# Patient Record
Sex: Male | Born: 1945
Health system: Southern US, Community
[De-identification: ages and names within clinical notes are randomized; demographics above are authoritative.]

## PROBLEM LIST (undated history)

## (undated) DIAGNOSIS — E119 Type 2 diabetes mellitus without complications: Secondary | ICD-10-CM

## (undated) DIAGNOSIS — F32A Depression, unspecified: Secondary | ICD-10-CM

## (undated) DIAGNOSIS — Z8673 Personal history of transient ischemic attack (TIA), and cerebral infarction without residual deficits: Secondary | ICD-10-CM

## (undated) DIAGNOSIS — K922 Gastrointestinal hemorrhage, unspecified: Secondary | ICD-10-CM

## (undated) DIAGNOSIS — E785 Hyperlipidemia, unspecified: Secondary | ICD-10-CM

## (undated) DIAGNOSIS — I4891 Unspecified atrial fibrillation: Secondary | ICD-10-CM

## (undated) DIAGNOSIS — N182 Chronic kidney disease, stage 2 (mild): Secondary | ICD-10-CM

## (undated) DIAGNOSIS — G4730 Sleep apnea, unspecified: Secondary | ICD-10-CM

## (undated) DIAGNOSIS — I639 Cerebral infarction, unspecified: Secondary | ICD-10-CM

## (undated) DIAGNOSIS — K259 Gastric ulcer, unspecified as acute or chronic, without hemorrhage or perforation: Secondary | ICD-10-CM

## (undated) DIAGNOSIS — Z7901 Long term (current) use of anticoagulants: Secondary | ICD-10-CM

## (undated) DIAGNOSIS — F329 Major depressive disorder, single episode, unspecified: Secondary | ICD-10-CM

## (undated) DIAGNOSIS — K219 Gastro-esophageal reflux disease without esophagitis: Secondary | ICD-10-CM

## (undated) DIAGNOSIS — I480 Paroxysmal atrial fibrillation: Secondary | ICD-10-CM

## (undated) DIAGNOSIS — R0602 Shortness of breath: Secondary | ICD-10-CM

## (undated) DIAGNOSIS — I4821 Permanent atrial fibrillation: Secondary | ICD-10-CM

## (undated) DIAGNOSIS — I495 Sick sinus syndrome: Secondary | ICD-10-CM

## (undated) DIAGNOSIS — I2510 Atherosclerotic heart disease of native coronary artery without angina pectoris: Secondary | ICD-10-CM

## (undated) DIAGNOSIS — I219 Acute myocardial infarction, unspecified: Secondary | ICD-10-CM

## (undated) DIAGNOSIS — N185 Chronic kidney disease, stage 5: Secondary | ICD-10-CM

## (undated) DIAGNOSIS — I10 Essential (primary) hypertension: Secondary | ICD-10-CM

## (undated) HISTORY — DX: Essential (primary) hypertension: I10

## (undated) HISTORY — DX: Hyperlipidemia, unspecified: E78.5

## (undated) HISTORY — DX: Paroxysmal atrial fibrillation: I48.0

## (undated) HISTORY — DX: Atherosclerotic heart disease of native coronary artery without angina pectoris: I25.10

## (undated) HISTORY — DX: Personal history of transient ischemic attack (TIA), and cerebral infarction without residual deficits: Z86.73

## (undated) HISTORY — DX: Chronic kidney disease, stage 5: N18.5

## (undated) HISTORY — DX: Sleep apnea, unspecified: G47.30

## (undated) HISTORY — DX: Cerebral infarction, unspecified: I63.9

## (undated) HISTORY — DX: Type 2 diabetes mellitus without complications: E11.9

## (undated) HISTORY — DX: Chronic kidney disease, stage 2 (mild): N18.2

## (undated) HISTORY — DX: Depression, unspecified: F32.A

## (undated) HISTORY — DX: Major depressive disorder, single episode, unspecified: F32.9

---
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
## 1999-03-31 DIAGNOSIS — I219 Acute myocardial infarction, unspecified: Secondary | ICD-10-CM

## 1999-03-31 HISTORY — DX: Acute myocardial infarction, unspecified: I21.9

## 2002-05-24 ENCOUNTER — Emergency Department (HOSPITAL_COMMUNITY): Admission: EM | Admit: 2002-05-24 | Discharge: 2002-05-24 | Payer: Self-pay | Admitting: Emergency Medicine

## 2003-01-26 ENCOUNTER — Encounter: Payer: Self-pay | Admitting: Emergency Medicine

## 2003-01-26 ENCOUNTER — Emergency Department (HOSPITAL_COMMUNITY): Admission: EM | Admit: 2003-01-26 | Discharge: 2003-01-27 | Payer: Self-pay | Admitting: Emergency Medicine

## 2003-02-03 ENCOUNTER — Encounter: Admission: RE | Admit: 2003-02-03 | Discharge: 2003-02-03 | Payer: Self-pay | Admitting: Internal Medicine

## 2003-02-17 ENCOUNTER — Ambulatory Visit (HOSPITAL_COMMUNITY): Admission: RE | Admit: 2003-02-17 | Discharge: 2003-02-17 | Payer: Self-pay | Admitting: Internal Medicine

## 2003-02-17 ENCOUNTER — Encounter: Admission: RE | Admit: 2003-02-17 | Discharge: 2003-02-17 | Payer: Self-pay | Admitting: Internal Medicine

## 2003-02-19 ENCOUNTER — Encounter: Admission: RE | Admit: 2003-02-19 | Discharge: 2003-02-19 | Payer: Self-pay | Admitting: Internal Medicine

## 2003-02-22 ENCOUNTER — Encounter: Admission: RE | Admit: 2003-02-22 | Discharge: 2003-02-22 | Payer: Self-pay | Admitting: Internal Medicine

## 2003-02-26 ENCOUNTER — Encounter: Admission: RE | Admit: 2003-02-26 | Discharge: 2003-02-26 | Payer: Self-pay | Admitting: Internal Medicine

## 2003-03-05 ENCOUNTER — Encounter: Admission: RE | Admit: 2003-03-05 | Discharge: 2003-03-05 | Payer: Self-pay | Admitting: Internal Medicine

## 2003-03-17 ENCOUNTER — Encounter: Admission: RE | Admit: 2003-03-17 | Discharge: 2003-03-17 | Payer: Self-pay | Admitting: Internal Medicine

## 2003-03-30 ENCOUNTER — Encounter: Payer: Self-pay | Admitting: Internal Medicine

## 2003-03-30 ENCOUNTER — Encounter: Admission: RE | Admit: 2003-03-30 | Discharge: 2003-03-30 | Payer: Self-pay | Admitting: Internal Medicine

## 2003-03-30 ENCOUNTER — Inpatient Hospital Stay (HOSPITAL_COMMUNITY): Admission: RE | Admit: 2003-03-30 | Discharge: 2003-04-08 | Payer: Self-pay | Admitting: Internal Medicine

## 2003-04-01 ENCOUNTER — Encounter: Payer: Self-pay | Admitting: Internal Medicine

## 2003-04-07 ENCOUNTER — Encounter: Payer: Self-pay | Admitting: Internal Medicine

## 2003-04-12 ENCOUNTER — Encounter: Admission: RE | Admit: 2003-04-12 | Discharge: 2003-04-12 | Payer: Self-pay | Admitting: Internal Medicine

## 2003-04-16 ENCOUNTER — Encounter: Admission: RE | Admit: 2003-04-16 | Discharge: 2003-04-16 | Payer: Self-pay | Admitting: Internal Medicine

## 2003-04-22 ENCOUNTER — Encounter: Admission: RE | Admit: 2003-04-22 | Discharge: 2003-04-22 | Payer: Self-pay | Admitting: Internal Medicine

## 2003-04-26 ENCOUNTER — Encounter: Admission: RE | Admit: 2003-04-26 | Discharge: 2003-04-26 | Payer: Self-pay | Admitting: Internal Medicine

## 2003-04-29 ENCOUNTER — Encounter: Admission: RE | Admit: 2003-04-29 | Discharge: 2003-04-29 | Payer: Self-pay | Admitting: Internal Medicine

## 2003-05-04 ENCOUNTER — Encounter: Admission: RE | Admit: 2003-05-04 | Discharge: 2003-05-04 | Payer: Self-pay | Admitting: Internal Medicine

## 2003-05-11 ENCOUNTER — Encounter: Admission: RE | Admit: 2003-05-11 | Discharge: 2003-05-11 | Payer: Self-pay | Admitting: Internal Medicine

## 2003-05-25 ENCOUNTER — Encounter: Admission: RE | Admit: 2003-05-25 | Discharge: 2003-05-25 | Payer: Self-pay | Admitting: Internal Medicine

## 2003-06-08 ENCOUNTER — Encounter: Admission: RE | Admit: 2003-06-08 | Discharge: 2003-06-08 | Payer: Self-pay | Admitting: Internal Medicine

## 2003-07-31 HISTORY — PX: CARDIAC CATHETERIZATION: SHX172

## 2003-09-23 ENCOUNTER — Encounter: Admission: RE | Admit: 2003-09-23 | Discharge: 2003-09-23 | Payer: Self-pay | Admitting: Internal Medicine

## 2003-09-30 ENCOUNTER — Encounter: Admission: RE | Admit: 2003-09-30 | Discharge: 2003-09-30 | Payer: Self-pay | Admitting: Internal Medicine

## 2004-03-06 ENCOUNTER — Encounter: Admission: RE | Admit: 2004-03-06 | Discharge: 2004-03-06 | Payer: Self-pay | Admitting: Internal Medicine

## 2004-03-13 ENCOUNTER — Encounter: Admission: RE | Admit: 2004-03-13 | Discharge: 2004-03-13 | Payer: Self-pay | Admitting: Internal Medicine

## 2004-04-24 ENCOUNTER — Ambulatory Visit: Payer: Self-pay | Admitting: Internal Medicine

## 2004-04-24 ENCOUNTER — Inpatient Hospital Stay (HOSPITAL_COMMUNITY): Admission: RE | Admit: 2004-04-24 | Discharge: 2004-04-27 | Payer: Self-pay | Admitting: Internal Medicine

## 2004-04-25 ENCOUNTER — Encounter: Payer: Self-pay | Admitting: Cardiology

## 2004-05-03 ENCOUNTER — Ambulatory Visit: Payer: Self-pay | Admitting: Internal Medicine

## 2004-05-23 ENCOUNTER — Ambulatory Visit: Payer: Self-pay | Admitting: Internal Medicine

## 2004-06-15 ENCOUNTER — Ambulatory Visit: Payer: Self-pay | Admitting: Internal Medicine

## 2004-06-15 ENCOUNTER — Ambulatory Visit (HOSPITAL_COMMUNITY): Admission: RE | Admit: 2004-06-15 | Discharge: 2004-06-15 | Payer: Self-pay | Admitting: Internal Medicine

## 2004-07-04 ENCOUNTER — Ambulatory Visit: Payer: Self-pay | Admitting: Internal Medicine

## 2004-07-19 ENCOUNTER — Ambulatory Visit: Payer: Self-pay | Admitting: Internal Medicine

## 2004-08-28 ENCOUNTER — Ambulatory Visit: Payer: Self-pay | Admitting: Internal Medicine

## 2004-09-04 ENCOUNTER — Ambulatory Visit: Payer: Self-pay | Admitting: Internal Medicine

## 2004-10-12 ENCOUNTER — Ambulatory Visit: Payer: Self-pay | Admitting: Internal Medicine

## 2004-10-27 ENCOUNTER — Ambulatory Visit: Payer: Self-pay | Admitting: Internal Medicine

## 2005-08-09 ENCOUNTER — Ambulatory Visit: Payer: Self-pay | Admitting: Physical Medicine & Rehabilitation

## 2005-08-09 ENCOUNTER — Inpatient Hospital Stay (HOSPITAL_COMMUNITY): Admission: EM | Admit: 2005-08-09 | Discharge: 2005-08-24 | Payer: Self-pay | Admitting: Emergency Medicine

## 2005-08-09 ENCOUNTER — Ambulatory Visit: Payer: Self-pay | Admitting: Internal Medicine

## 2005-08-11 ENCOUNTER — Encounter (INDEPENDENT_AMBULATORY_CARE_PROVIDER_SITE_OTHER): Payer: Self-pay | Admitting: Specialist

## 2005-08-27 ENCOUNTER — Ambulatory Visit: Payer: Self-pay | Admitting: Internal Medicine

## 2005-09-11 ENCOUNTER — Ambulatory Visit: Payer: Self-pay | Admitting: Internal Medicine

## 2005-09-14 ENCOUNTER — Ambulatory Visit: Payer: Self-pay | Admitting: Internal Medicine

## 2005-09-19 ENCOUNTER — Ambulatory Visit: Payer: Self-pay | Admitting: Internal Medicine

## 2005-10-09 ENCOUNTER — Encounter: Admission: RE | Admit: 2005-10-09 | Discharge: 2005-10-09 | Payer: Self-pay | Admitting: Neurosurgery

## 2005-11-22 ENCOUNTER — Ambulatory Visit: Payer: Self-pay | Admitting: Internal Medicine

## 2005-12-11 ENCOUNTER — Ambulatory Visit (HOSPITAL_COMMUNITY): Admission: RE | Admit: 2005-12-11 | Discharge: 2005-12-11 | Payer: Self-pay | Admitting: Internal Medicine

## 2005-12-11 ENCOUNTER — Encounter (INDEPENDENT_AMBULATORY_CARE_PROVIDER_SITE_OTHER): Payer: Self-pay | Admitting: Cardiology

## 2006-01-08 ENCOUNTER — Ambulatory Visit (HOSPITAL_COMMUNITY): Admission: RE | Admit: 2006-01-08 | Discharge: 2006-01-08 | Payer: Self-pay | Admitting: Internal Medicine

## 2006-01-08 ENCOUNTER — Ambulatory Visit: Payer: Self-pay | Admitting: Internal Medicine

## 2006-04-24 ENCOUNTER — Ambulatory Visit: Payer: Self-pay | Admitting: Hospitalist

## 2006-04-25 ENCOUNTER — Ambulatory Visit: Payer: Self-pay | Admitting: Internal Medicine

## 2006-05-22 ENCOUNTER — Ambulatory Visit: Payer: Self-pay | Admitting: Internal Medicine

## 2006-05-29 ENCOUNTER — Encounter (INDEPENDENT_AMBULATORY_CARE_PROVIDER_SITE_OTHER): Payer: Self-pay | Admitting: Pulmonary Disease

## 2006-05-29 ENCOUNTER — Ambulatory Visit: Payer: Self-pay | Admitting: Internal Medicine

## 2006-05-29 LAB — CONVERTED CEMR LAB
BUN: 18 mg/dL (ref 6–23)
CO2: 30 meq/L (ref 19–32)
Creatinine, Ser: 1.65 mg/dL — ABNORMAL HIGH (ref 0.40–1.50)

## 2006-06-05 ENCOUNTER — Inpatient Hospital Stay (HOSPITAL_COMMUNITY): Admission: AD | Admit: 2006-06-05 | Discharge: 2006-06-06 | Payer: Self-pay | Admitting: Infectious Diseases

## 2006-06-05 ENCOUNTER — Ambulatory Visit: Payer: Self-pay | Admitting: Infectious Diseases

## 2006-06-05 ENCOUNTER — Ambulatory Visit: Payer: Self-pay | Admitting: Hospitalist

## 2006-06-05 DIAGNOSIS — M109 Gout, unspecified: Secondary | ICD-10-CM | POA: Insufficient documentation

## 2006-06-05 DIAGNOSIS — E785 Hyperlipidemia, unspecified: Secondary | ICD-10-CM | POA: Insufficient documentation

## 2006-06-05 DIAGNOSIS — I10 Essential (primary) hypertension: Secondary | ICD-10-CM | POA: Insufficient documentation

## 2006-06-05 DIAGNOSIS — E119 Type 2 diabetes mellitus without complications: Secondary | ICD-10-CM | POA: Insufficient documentation

## 2006-06-05 DIAGNOSIS — G4730 Sleep apnea, unspecified: Secondary | ICD-10-CM | POA: Insufficient documentation

## 2006-06-05 DIAGNOSIS — F329 Major depressive disorder, single episode, unspecified: Secondary | ICD-10-CM | POA: Insufficient documentation

## 2006-06-05 DIAGNOSIS — E782 Mixed hyperlipidemia: Secondary | ICD-10-CM | POA: Insufficient documentation

## 2006-07-19 DIAGNOSIS — I2510 Atherosclerotic heart disease of native coronary artery without angina pectoris: Secondary | ICD-10-CM | POA: Insufficient documentation

## 2006-08-19 ENCOUNTER — Ambulatory Visit: Payer: Self-pay | Admitting: Internal Medicine

## 2006-08-19 ENCOUNTER — Ambulatory Visit (HOSPITAL_COMMUNITY): Admission: RE | Admit: 2006-08-19 | Discharge: 2006-08-19 | Payer: Self-pay | Admitting: Internal Medicine

## 2006-08-19 ENCOUNTER — Encounter (INDEPENDENT_AMBULATORY_CARE_PROVIDER_SITE_OTHER): Payer: Self-pay | Admitting: Internal Medicine

## 2006-08-20 LAB — CONVERTED CEMR LAB
ALT: 22 units/L (ref 0–53)
AST: 20 units/L (ref 0–37)
Albumin: 4.3 g/dL (ref 3.5–5.2)
Alkaline Phosphatase: 88 units/L (ref 39–117)
BUN: 27 mg/dL — ABNORMAL HIGH (ref 6–23)
Basophils Absolute: 0 10*3/uL (ref 0.0–0.1)
Basophils Relative: 1 % (ref 0–1)
CO2: 28 meq/L (ref 19–32)
Calcium: 9.7 mg/dL (ref 8.4–10.5)
Chloride: 105 meq/L (ref 96–112)
Creatinine, Ser: 1.73 mg/dL — ABNORMAL HIGH (ref 0.40–1.50)
Eosinophils Relative: 10 % — ABNORMAL HIGH (ref 0–5)
Glucose, Bld: 83 mg/dL (ref 70–99)
HCT: 44.9 % (ref 39.0–52.0)
Hemoglobin: 14.5 g/dL (ref 13.0–17.0)
Lymphocytes Relative: 34 % (ref 12–46)
Lymphs Abs: 2.1 10*3/uL (ref 0.7–3.3)
MCHC: 32.3 g/dL (ref 30.0–36.0)
MCV: 82.1 fL (ref 78.0–100.0)
Monocytes Absolute: 0.7 10*3/uL (ref 0.2–0.7)
Monocytes Relative: 11 % (ref 3–11)
Neutro Abs: 2.8 10*3/uL (ref 1.7–7.7)
Neutrophils Relative %: 45 % (ref 43–77)
Platelets: 229 10*3/uL (ref 150–400)
Potassium: 4 meq/L (ref 3.5–5.3)
RBC: 5.47 M/uL (ref 4.22–5.81)
RDW: 13.3 % (ref 11.5–14.0)
Sodium: 143 meq/L (ref 135–145)
Total Bilirubin: 0.5 mg/dL (ref 0.3–1.2)
Total Protein: 7.1 g/dL (ref 6.0–8.3)
WBC: 6.3 10*3/uL (ref 4.0–10.5)

## 2006-09-19 ENCOUNTER — Ambulatory Visit: Payer: Self-pay | Admitting: Hospitalist

## 2006-09-19 ENCOUNTER — Encounter (INDEPENDENT_AMBULATORY_CARE_PROVIDER_SITE_OTHER): Payer: Self-pay | Admitting: *Deleted

## 2006-10-09 ENCOUNTER — Ambulatory Visit: Payer: Self-pay | Admitting: *Deleted

## 2006-10-09 ENCOUNTER — Encounter (INDEPENDENT_AMBULATORY_CARE_PROVIDER_SITE_OTHER): Payer: Self-pay | Admitting: *Deleted

## 2006-10-09 ENCOUNTER — Ambulatory Visit (HOSPITAL_COMMUNITY): Admission: RE | Admit: 2006-10-09 | Discharge: 2006-10-09 | Payer: Self-pay | Admitting: Internal Medicine

## 2006-10-09 LAB — CONVERTED CEMR LAB
Blood Glucose, Fingerstick: 166
INR: 0.9

## 2006-10-14 ENCOUNTER — Ambulatory Visit: Payer: Self-pay | Admitting: *Deleted

## 2006-10-14 ENCOUNTER — Encounter (INDEPENDENT_AMBULATORY_CARE_PROVIDER_SITE_OTHER): Payer: Self-pay | Admitting: Pharmacist

## 2006-10-14 LAB — CONVERTED CEMR LAB: INR: 1.3

## 2006-10-23 ENCOUNTER — Encounter (INDEPENDENT_AMBULATORY_CARE_PROVIDER_SITE_OTHER): Payer: Self-pay | Admitting: Internal Medicine

## 2006-10-23 ENCOUNTER — Ambulatory Visit: Payer: Self-pay | Admitting: Internal Medicine

## 2006-10-28 ENCOUNTER — Ambulatory Visit: Payer: Self-pay | Admitting: Internal Medicine

## 2006-10-28 LAB — CONVERTED CEMR LAB: INR: 4.6

## 2006-11-11 ENCOUNTER — Ambulatory Visit: Payer: Self-pay | Admitting: Hospitalist

## 2006-11-20 ENCOUNTER — Ambulatory Visit: Payer: Self-pay | Admitting: Hospitalist

## 2006-11-20 ENCOUNTER — Encounter (INDEPENDENT_AMBULATORY_CARE_PROVIDER_SITE_OTHER): Payer: Self-pay | Admitting: *Deleted

## 2006-11-20 LAB — CONVERTED CEMR LAB: Blood Glucose, Fingerstick: 164

## 2006-11-25 ENCOUNTER — Ambulatory Visit: Payer: Self-pay | Admitting: Hospitalist

## 2006-11-25 LAB — CONVERTED CEMR LAB
ALT: 19 units/L (ref 0–53)
Alkaline Phosphatase: 92 units/L (ref 39–117)
CO2: 26 meq/L (ref 19–32)
Chloride: 101 meq/L (ref 96–112)
INR: 2.3
Potassium: 3.8 meq/L (ref 3.5–5.3)
Sodium: 140 meq/L (ref 135–145)
Total Bilirubin: 0.6 mg/dL (ref 0.3–1.2)

## 2006-12-16 ENCOUNTER — Ambulatory Visit: Payer: Self-pay | Admitting: Internal Medicine

## 2006-12-16 LAB — CONVERTED CEMR LAB

## 2006-12-30 ENCOUNTER — Ambulatory Visit: Payer: Self-pay | Admitting: Internal Medicine

## 2007-01-15 ENCOUNTER — Telehealth (INDEPENDENT_AMBULATORY_CARE_PROVIDER_SITE_OTHER): Payer: Self-pay | Admitting: *Deleted

## 2007-01-17 ENCOUNTER — Encounter (INDEPENDENT_AMBULATORY_CARE_PROVIDER_SITE_OTHER): Payer: Self-pay | Admitting: *Deleted

## 2007-01-17 ENCOUNTER — Ambulatory Visit: Payer: Self-pay | Admitting: Internal Medicine

## 2007-01-17 LAB — CONVERTED CEMR LAB: Blood Glucose, Fingerstick: 223

## 2007-01-18 LAB — CONVERTED CEMR LAB
Creatinine, Ser: 1.78 mg/dL — ABNORMAL HIGH (ref 0.40–1.50)
Potassium: 3.5 meq/L (ref 3.5–5.3)
Sodium: 142 meq/L (ref 135–145)

## 2007-01-20 ENCOUNTER — Ambulatory Visit: Payer: Self-pay | Admitting: Internal Medicine

## 2007-01-20 LAB — CONVERTED CEMR LAB

## 2007-02-10 ENCOUNTER — Ambulatory Visit: Payer: Self-pay | Admitting: Hospitalist

## 2007-02-10 LAB — CONVERTED CEMR LAB

## 2007-03-10 ENCOUNTER — Ambulatory Visit: Payer: Self-pay | Admitting: Infectious Disease

## 2007-03-10 LAB — CONVERTED CEMR LAB: INR: 2.8

## 2007-04-16 ENCOUNTER — Encounter (INDEPENDENT_AMBULATORY_CARE_PROVIDER_SITE_OTHER): Payer: Self-pay | Admitting: *Deleted

## 2007-04-16 ENCOUNTER — Ambulatory Visit: Payer: Self-pay | Admitting: Internal Medicine

## 2007-04-16 LAB — CONVERTED CEMR LAB
Blood Glucose, Fingerstick: 322
Hgb A1c MFr Bld: 8.8 %

## 2007-04-22 LAB — CONVERTED CEMR LAB
ALT: 21 units/L (ref 0–53)
Alkaline Phosphatase: 90 units/L (ref 39–117)
CO2: 25 meq/L (ref 19–32)
Calcium: 9.2 mg/dL (ref 8.4–10.5)
Chloride: 102 meq/L (ref 96–112)
Sodium: 141 meq/L (ref 135–145)
Total Protein: 7.1 g/dL (ref 6.0–8.3)

## 2007-04-24 ENCOUNTER — Telehealth: Payer: Self-pay | Admitting: *Deleted

## 2007-06-23 ENCOUNTER — Telehealth: Payer: Self-pay | Admitting: *Deleted

## 2007-08-04 ENCOUNTER — Telehealth (INDEPENDENT_AMBULATORY_CARE_PROVIDER_SITE_OTHER): Payer: Self-pay | Admitting: *Deleted

## 2007-08-05 ENCOUNTER — Encounter (INDEPENDENT_AMBULATORY_CARE_PROVIDER_SITE_OTHER): Payer: Self-pay | Admitting: *Deleted

## 2007-08-05 ENCOUNTER — Ambulatory Visit: Payer: Self-pay | Admitting: Internal Medicine

## 2007-08-07 LAB — CONVERTED CEMR LAB
ALT: 23 units/L (ref 0–53)
BUN: 22 mg/dL (ref 6–23)
CO2: 26 meq/L (ref 19–32)
Calcium: 9.5 mg/dL (ref 8.4–10.5)
Chloride: 104 meq/L (ref 96–112)
Glucose, Bld: 186 mg/dL — ABNORMAL HIGH (ref 70–99)
Potassium: 4 meq/L (ref 3.5–5.3)
Sodium: 142 meq/L (ref 135–145)
Total Bilirubin: 0.5 mg/dL (ref 0.3–1.2)

## 2007-09-04 ENCOUNTER — Telehealth (INDEPENDENT_AMBULATORY_CARE_PROVIDER_SITE_OTHER): Payer: Self-pay | Admitting: *Deleted

## 2007-09-08 ENCOUNTER — Ambulatory Visit: Payer: Self-pay | Admitting: Internal Medicine

## 2007-09-19 ENCOUNTER — Encounter (INDEPENDENT_AMBULATORY_CARE_PROVIDER_SITE_OTHER): Payer: Self-pay | Admitting: *Deleted

## 2007-10-06 ENCOUNTER — Ambulatory Visit: Payer: Self-pay | Admitting: *Deleted

## 2007-10-06 LAB — CONVERTED CEMR LAB: INR: 2.4

## 2007-10-14 ENCOUNTER — Encounter (INDEPENDENT_AMBULATORY_CARE_PROVIDER_SITE_OTHER): Payer: Self-pay | Admitting: *Deleted

## 2007-10-28 ENCOUNTER — Encounter: Payer: Self-pay | Admitting: *Deleted

## 2007-10-28 ENCOUNTER — Ambulatory Visit: Payer: Self-pay | Admitting: Internal Medicine

## 2007-10-28 ENCOUNTER — Encounter (INDEPENDENT_AMBULATORY_CARE_PROVIDER_SITE_OTHER): Payer: Self-pay | Admitting: *Deleted

## 2007-10-28 LAB — CONVERTED CEMR LAB
HCT: 42.5 % (ref 39.0–52.0)
Hemoglobin: 14.2 g/dL (ref 13.0–17.0)
MCV: 81.1 fL (ref 78.0–100.0)
RDW: 13.3 % (ref 11.5–15.5)
WBC: 5.7 10*3/uL (ref 4.0–10.5)

## 2007-10-29 ENCOUNTER — Telehealth (INDEPENDENT_AMBULATORY_CARE_PROVIDER_SITE_OTHER): Payer: Self-pay | Admitting: *Deleted

## 2007-10-30 ENCOUNTER — Ambulatory Visit: Payer: Self-pay | Admitting: Hospitalist

## 2007-11-03 ENCOUNTER — Ambulatory Visit: Payer: Self-pay | Admitting: Internal Medicine

## 2007-11-03 LAB — CONVERTED CEMR LAB: INR: 1.9

## 2007-11-04 ENCOUNTER — Telehealth: Payer: Self-pay | Admitting: *Deleted

## 2007-11-24 ENCOUNTER — Ambulatory Visit: Payer: Self-pay | Admitting: Infectious Disease

## 2007-11-24 LAB — CONVERTED CEMR LAB: INR: 3.8

## 2007-12-04 ENCOUNTER — Encounter (INDEPENDENT_AMBULATORY_CARE_PROVIDER_SITE_OTHER): Payer: Self-pay | Admitting: *Deleted

## 2007-12-18 ENCOUNTER — Ambulatory Visit: Payer: Self-pay | Admitting: Internal Medicine

## 2007-12-18 LAB — CONVERTED CEMR LAB: INR: 2.7

## 2007-12-31 ENCOUNTER — Telehealth (INDEPENDENT_AMBULATORY_CARE_PROVIDER_SITE_OTHER): Payer: Self-pay | Admitting: *Deleted

## 2008-01-12 ENCOUNTER — Ambulatory Visit: Payer: Self-pay | Admitting: Internal Medicine

## 2008-02-09 ENCOUNTER — Ambulatory Visit: Payer: Self-pay | Admitting: Internal Medicine

## 2008-03-08 ENCOUNTER — Ambulatory Visit: Payer: Self-pay | Admitting: Internal Medicine

## 2008-03-08 LAB — CONVERTED CEMR LAB: INR: 2.9

## 2008-04-13 ENCOUNTER — Ambulatory Visit: Payer: Self-pay | Admitting: Internal Medicine

## 2008-04-13 LAB — CONVERTED CEMR LAB

## 2008-04-23 ENCOUNTER — Telehealth: Payer: Self-pay | Admitting: *Deleted

## 2008-05-05 ENCOUNTER — Telehealth: Payer: Self-pay | Admitting: *Deleted

## 2008-05-10 ENCOUNTER — Ambulatory Visit: Payer: Self-pay | Admitting: Internal Medicine

## 2008-05-10 LAB — CONVERTED CEMR LAB: INR: 2.9

## 2008-05-14 ENCOUNTER — Telehealth (INDEPENDENT_AMBULATORY_CARE_PROVIDER_SITE_OTHER): Payer: Self-pay | Admitting: Internal Medicine

## 2008-05-24 ENCOUNTER — Telehealth: Payer: Self-pay | Admitting: *Deleted

## 2008-06-07 ENCOUNTER — Ambulatory Visit: Payer: Self-pay | Admitting: *Deleted

## 2008-06-07 LAB — CONVERTED CEMR LAB

## 2008-06-09 ENCOUNTER — Encounter (INDEPENDENT_AMBULATORY_CARE_PROVIDER_SITE_OTHER): Payer: Self-pay | Admitting: Internal Medicine

## 2008-06-09 ENCOUNTER — Ambulatory Visit: Payer: Self-pay | Admitting: *Deleted

## 2008-06-11 ENCOUNTER — Telehealth (INDEPENDENT_AMBULATORY_CARE_PROVIDER_SITE_OTHER): Payer: Self-pay | Admitting: *Deleted

## 2008-06-14 LAB — CONVERTED CEMR LAB
ALT: 19 units/L (ref 0–53)
Albumin: 4.5 g/dL (ref 3.5–5.2)
CO2: 24 meq/L (ref 19–32)
Calcium: 9.6 mg/dL (ref 8.4–10.5)
Chloride: 103 meq/L (ref 96–112)
Cholesterol: 164 mg/dL (ref 0–200)
Microalb Creat Ratio: 153.8 mg/g — ABNORMAL HIGH (ref 0.0–30.0)
Potassium: 3.7 meq/L (ref 3.5–5.3)
Sodium: 142 meq/L (ref 135–145)
Total Protein: 7.4 g/dL (ref 6.0–8.3)
VLDL: 38 mg/dL (ref 0–40)

## 2008-06-16 ENCOUNTER — Ambulatory Visit: Payer: Self-pay | Admitting: *Deleted

## 2008-06-16 ENCOUNTER — Encounter (INDEPENDENT_AMBULATORY_CARE_PROVIDER_SITE_OTHER): Payer: Self-pay | Admitting: Internal Medicine

## 2008-07-05 ENCOUNTER — Ambulatory Visit: Payer: Self-pay | Admitting: Internal Medicine

## 2008-07-07 ENCOUNTER — Ambulatory Visit: Payer: Self-pay | Admitting: Internal Medicine

## 2008-07-07 ENCOUNTER — Encounter (INDEPENDENT_AMBULATORY_CARE_PROVIDER_SITE_OTHER): Payer: Self-pay | Admitting: Internal Medicine

## 2008-07-07 DIAGNOSIS — L989 Disorder of the skin and subcutaneous tissue, unspecified: Secondary | ICD-10-CM | POA: Insufficient documentation

## 2008-07-07 DIAGNOSIS — R609 Edema, unspecified: Secondary | ICD-10-CM

## 2008-07-07 LAB — CONVERTED CEMR LAB
BUN: 20 mg/dL (ref 6–23)
Chloride: 103 meq/L (ref 96–112)
Glucose, Bld: 209 mg/dL — ABNORMAL HIGH (ref 70–99)
Potassium: 3.8 meq/L (ref 3.5–5.3)
Sodium: 143 meq/L (ref 135–145)

## 2008-07-13 ENCOUNTER — Encounter (INDEPENDENT_AMBULATORY_CARE_PROVIDER_SITE_OTHER): Payer: Self-pay | Admitting: Internal Medicine

## 2008-07-19 ENCOUNTER — Ambulatory Visit: Payer: Self-pay | Admitting: Internal Medicine

## 2008-07-19 ENCOUNTER — Encounter (INDEPENDENT_AMBULATORY_CARE_PROVIDER_SITE_OTHER): Payer: Self-pay | Admitting: Internal Medicine

## 2008-08-02 ENCOUNTER — Ambulatory Visit: Payer: Self-pay | Admitting: Internal Medicine

## 2008-08-02 LAB — CONVERTED CEMR LAB

## 2008-08-10 ENCOUNTER — Ambulatory Visit: Payer: Self-pay | Admitting: Internal Medicine

## 2008-08-10 ENCOUNTER — Encounter: Payer: Self-pay | Admitting: Internal Medicine

## 2008-08-10 LAB — CONVERTED CEMR LAB: Blood Glucose, Fingerstick: 318

## 2008-08-11 LAB — CONVERTED CEMR LAB
ALT: 24 units/L (ref 0–53)
Albumin: 3.9 g/dL (ref 3.5–5.2)
Alkaline Phosphatase: 96 units/L (ref 39–117)
CO2: 24 meq/L (ref 19–32)
Glucose, Bld: 339 mg/dL — ABNORMAL HIGH (ref 70–99)
Potassium: 4.1 meq/L (ref 3.5–5.3)
Sodium: 141 meq/L (ref 135–145)
Total Bilirubin: 0.7 mg/dL (ref 0.3–1.2)
Total Protein: 6.9 g/dL (ref 6.0–8.3)

## 2008-08-19 ENCOUNTER — Telehealth (INDEPENDENT_AMBULATORY_CARE_PROVIDER_SITE_OTHER): Payer: Self-pay | Admitting: Internal Medicine

## 2008-08-30 ENCOUNTER — Ambulatory Visit: Payer: Self-pay | Admitting: Internal Medicine

## 2008-08-30 LAB — CONVERTED CEMR LAB

## 2008-09-14 ENCOUNTER — Encounter (INDEPENDENT_AMBULATORY_CARE_PROVIDER_SITE_OTHER): Payer: Self-pay | Admitting: Internal Medicine

## 2008-09-14 ENCOUNTER — Ambulatory Visit: Payer: Self-pay | Admitting: Internal Medicine

## 2008-09-14 ENCOUNTER — Telehealth (INDEPENDENT_AMBULATORY_CARE_PROVIDER_SITE_OTHER): Payer: Self-pay | Admitting: Internal Medicine

## 2008-09-14 LAB — CONVERTED CEMR LAB
ALT: 32 units/L (ref 0–53)
Albumin: 3.9 g/dL (ref 3.5–5.2)
Blood Glucose, Fingerstick: 284
CO2: 30 meq/L (ref 19–32)
Calcium: 9.6 mg/dL (ref 8.4–10.5)
Chloride: 98 meq/L (ref 96–112)
Glucose, Bld: 254 mg/dL — ABNORMAL HIGH (ref 70–99)
HCT: 45.7 % (ref 39.0–52.0)
Hemoglobin: 15.6 g/dL (ref 13.0–17.0)
INR: 1.3 (ref 0.0–1.5)
INR: 1.5
Potassium: 4 meq/L (ref 3.5–5.3)
RBC: 5.6 M/uL (ref 4.22–5.81)
RDW: 13.3 % (ref 11.5–15.5)
Sodium: 136 meq/L (ref 135–145)
Total Bilirubin: 1 mg/dL (ref 0.3–1.2)
Total Protein: 7.4 g/dL (ref 6.0–8.3)
WBC: 4.7 10*3/uL (ref 4.0–10.5)

## 2008-09-15 ENCOUNTER — Encounter (INDEPENDENT_AMBULATORY_CARE_PROVIDER_SITE_OTHER): Payer: Self-pay | Admitting: Internal Medicine

## 2008-09-27 ENCOUNTER — Ambulatory Visit: Payer: Self-pay | Admitting: Internal Medicine

## 2008-09-27 LAB — CONVERTED CEMR LAB: INR: 1

## 2008-10-06 ENCOUNTER — Encounter (INDEPENDENT_AMBULATORY_CARE_PROVIDER_SITE_OTHER): Payer: Self-pay | Admitting: Internal Medicine

## 2008-10-06 ENCOUNTER — Ambulatory Visit: Payer: Self-pay | Admitting: Internal Medicine

## 2008-10-06 LAB — CONVERTED CEMR LAB
BUN: 29 mg/dL — ABNORMAL HIGH (ref 6–23)
Calcium: 9.5 mg/dL (ref 8.4–10.5)
Chloride: 102 meq/L (ref 96–112)
Creatinine, Ser: 2.21 mg/dL — ABNORMAL HIGH (ref 0.40–1.50)

## 2008-10-12 ENCOUNTER — Ambulatory Visit: Payer: Self-pay | Admitting: *Deleted

## 2008-10-12 ENCOUNTER — Telehealth: Payer: Self-pay | Admitting: Internal Medicine

## 2008-10-21 ENCOUNTER — Ambulatory Visit: Payer: Self-pay | Admitting: *Deleted

## 2008-10-21 ENCOUNTER — Encounter (INDEPENDENT_AMBULATORY_CARE_PROVIDER_SITE_OTHER): Payer: Self-pay | Admitting: Internal Medicine

## 2008-10-22 LAB — CONVERTED CEMR LAB
Calcium: 9.2 mg/dL (ref 8.4–10.5)
GFR calc Af Amer: 37 mL/min — ABNORMAL LOW (ref >60)
GFR calc non Af Amer: 31 mL/min — ABNORMAL LOW (ref >60)
Glucose, Bld: 237 mg/dL — ABNORMAL HIGH (ref 70–99)
Potassium: 3.5 meq/L (ref 3.5–5.3)
Sodium: 139 meq/L (ref 135–145)

## 2008-10-25 ENCOUNTER — Ambulatory Visit: Payer: Self-pay | Admitting: Internal Medicine

## 2008-10-25 LAB — CONVERTED CEMR LAB: INR: 2

## 2008-11-19 ENCOUNTER — Encounter (INDEPENDENT_AMBULATORY_CARE_PROVIDER_SITE_OTHER): Payer: Self-pay | Admitting: Internal Medicine

## 2008-11-19 ENCOUNTER — Ambulatory Visit: Payer: Self-pay | Admitting: Internal Medicine

## 2008-11-21 LAB — CONVERTED CEMR LAB
BUN: 24 mg/dL — ABNORMAL HIGH (ref 6–23)
CO2: 25 meq/L (ref 19–32)
Calcium: 9.1 mg/dL (ref 8.4–10.5)
Chloride: 100 meq/L (ref 96–112)
Creatinine, Ser: 1.98 mg/dL — ABNORMAL HIGH (ref 0.40–1.50)
GFR calc non Af Amer: 35 mL/min — ABNORMAL LOW (ref >60)

## 2008-11-22 ENCOUNTER — Ambulatory Visit: Payer: Self-pay | Admitting: Internal Medicine

## 2008-12-20 ENCOUNTER — Ambulatory Visit: Payer: Self-pay | Admitting: *Deleted

## 2008-12-20 ENCOUNTER — Encounter (INDEPENDENT_AMBULATORY_CARE_PROVIDER_SITE_OTHER): Payer: Self-pay | Admitting: Internal Medicine

## 2008-12-20 LAB — CONVERTED CEMR LAB: Blood Glucose, Fingerstick: 179

## 2008-12-21 DIAGNOSIS — N185 Chronic kidney disease, stage 5: Secondary | ICD-10-CM | POA: Insufficient documentation

## 2008-12-21 DIAGNOSIS — N184 Chronic kidney disease, stage 4 (severe): Secondary | ICD-10-CM | POA: Insufficient documentation

## 2008-12-21 LAB — CONVERTED CEMR LAB
Albumin: 4.2 g/dL (ref 3.5–5.2)
Alkaline Phosphatase: 94 units/L (ref 39–117)
BUN: 23 mg/dL (ref 6–23)
GFR calc non Af Amer: 35 mL/min — ABNORMAL LOW (ref >60)
Glucose, Bld: 189 mg/dL — ABNORMAL HIGH (ref 70–99)
Microalb, Ur: 87.66 mg/dL — ABNORMAL HIGH (ref 0.00–1.89)
Potassium: 4 meq/L (ref 3.5–5.3)

## 2009-01-17 ENCOUNTER — Ambulatory Visit: Payer: Self-pay | Admitting: Internal Medicine

## 2009-01-17 LAB — CONVERTED CEMR LAB

## 2009-02-14 ENCOUNTER — Ambulatory Visit: Payer: Self-pay | Admitting: Internal Medicine

## 2009-02-23 ENCOUNTER — Telehealth (INDEPENDENT_AMBULATORY_CARE_PROVIDER_SITE_OTHER): Payer: Self-pay | Admitting: Internal Medicine

## 2009-03-14 ENCOUNTER — Ambulatory Visit: Payer: Self-pay | Admitting: Internal Medicine

## 2009-03-14 LAB — CONVERTED CEMR LAB: INR: 2.7

## 2009-04-06 ENCOUNTER — Ambulatory Visit: Payer: Self-pay | Admitting: Infectious Diseases

## 2009-04-06 LAB — CONVERTED CEMR LAB
Blood Glucose, Fingerstick: 282
CO2: 25 meq/L (ref 19–32)
Calcium: 8.7 mg/dL (ref 8.4–10.5)
Sodium: 140 meq/L (ref 135–145)

## 2009-04-11 ENCOUNTER — Ambulatory Visit: Payer: Self-pay | Admitting: Infectious Diseases

## 2009-04-11 LAB — CONVERTED CEMR LAB: INR: 2.6

## 2009-04-22 ENCOUNTER — Ambulatory Visit: Payer: Self-pay | Admitting: Infectious Diseases

## 2009-04-25 LAB — CONVERTED CEMR LAB
Cholesterol: 137 mg/dL (ref 0–200)
Triglycerides: 185 mg/dL — ABNORMAL HIGH (ref <150)

## 2009-05-16 ENCOUNTER — Ambulatory Visit: Payer: Self-pay | Admitting: Internal Medicine

## 2009-05-26 ENCOUNTER — Ambulatory Visit: Payer: Self-pay | Admitting: Internal Medicine

## 2009-05-26 LAB — CONVERTED CEMR LAB
Blood Glucose, Fingerstick: 251
CO2: 25 meq/L (ref 19–32)
Chloride: 100 meq/L (ref 96–112)
Potassium: 4 meq/L (ref 3.5–5.3)
Sodium: 139 meq/L (ref 135–145)

## 2009-05-27 ENCOUNTER — Telehealth (INDEPENDENT_AMBULATORY_CARE_PROVIDER_SITE_OTHER): Payer: Self-pay | Admitting: Internal Medicine

## 2009-06-13 ENCOUNTER — Ambulatory Visit: Payer: Self-pay | Admitting: Internal Medicine

## 2009-06-16 ENCOUNTER — Telehealth (INDEPENDENT_AMBULATORY_CARE_PROVIDER_SITE_OTHER): Payer: Self-pay | Admitting: Internal Medicine

## 2009-06-21 ENCOUNTER — Telehealth (INDEPENDENT_AMBULATORY_CARE_PROVIDER_SITE_OTHER): Payer: Self-pay | Admitting: Internal Medicine

## 2009-06-27 ENCOUNTER — Ambulatory Visit: Payer: Self-pay | Admitting: Internal Medicine

## 2009-06-27 ENCOUNTER — Telehealth (INDEPENDENT_AMBULATORY_CARE_PROVIDER_SITE_OTHER): Payer: Self-pay | Admitting: Internal Medicine

## 2009-06-27 LAB — CONVERTED CEMR LAB
Blood Glucose, Fingerstick: 299
Hgb A1c MFr Bld: 11.5 %

## 2009-07-11 ENCOUNTER — Ambulatory Visit: Payer: Self-pay | Admitting: Internal Medicine

## 2009-07-11 LAB — CONVERTED CEMR LAB: INR: 2.8

## 2009-08-08 ENCOUNTER — Ambulatory Visit: Payer: Self-pay | Admitting: Internal Medicine

## 2009-08-08 LAB — CONVERTED CEMR LAB

## 2009-08-19 ENCOUNTER — Telehealth (INDEPENDENT_AMBULATORY_CARE_PROVIDER_SITE_OTHER): Payer: Self-pay | Admitting: Internal Medicine

## 2009-09-05 ENCOUNTER — Ambulatory Visit: Payer: Self-pay | Admitting: Internal Medicine

## 2009-10-03 ENCOUNTER — Ambulatory Visit: Payer: Self-pay | Admitting: Internal Medicine

## 2009-10-03 LAB — CONVERTED CEMR LAB

## 2009-10-11 ENCOUNTER — Ambulatory Visit: Payer: Self-pay | Admitting: Internal Medicine

## 2009-10-11 LAB — CONVERTED CEMR LAB
ALT: 34 units/L (ref 0–53)
AST: 27 units/L (ref 0–37)
Blood Glucose, Fingerstick: 335
CO2: 27 meq/L (ref 19–32)
Chloride: 95 meq/L — ABNORMAL LOW (ref 96–112)
Sodium: 136 meq/L (ref 135–145)
Total Bilirubin: 0.7 mg/dL (ref 0.3–1.2)
Total Protein: 7.2 g/dL (ref 6.0–8.3)

## 2009-10-17 ENCOUNTER — Ambulatory Visit: Payer: Self-pay | Admitting: Internal Medicine

## 2009-10-17 LAB — CONVERTED CEMR LAB

## 2009-10-27 ENCOUNTER — Telehealth (INDEPENDENT_AMBULATORY_CARE_PROVIDER_SITE_OTHER): Payer: Self-pay | Admitting: Internal Medicine

## 2009-11-14 ENCOUNTER — Ambulatory Visit: Payer: Self-pay | Admitting: Internal Medicine

## 2009-12-02 ENCOUNTER — Telehealth (INDEPENDENT_AMBULATORY_CARE_PROVIDER_SITE_OTHER): Payer: Self-pay | Admitting: *Deleted

## 2009-12-12 ENCOUNTER — Encounter (INDEPENDENT_AMBULATORY_CARE_PROVIDER_SITE_OTHER): Payer: Self-pay | Admitting: Internal Medicine

## 2009-12-16 ENCOUNTER — Telehealth (INDEPENDENT_AMBULATORY_CARE_PROVIDER_SITE_OTHER): Payer: Self-pay | Admitting: *Deleted

## 2009-12-19 ENCOUNTER — Ambulatory Visit: Payer: Self-pay | Admitting: Internal Medicine

## 2010-01-10 ENCOUNTER — Ambulatory Visit: Payer: Self-pay | Admitting: Internal Medicine

## 2010-01-10 DIAGNOSIS — I4891 Unspecified atrial fibrillation: Secondary | ICD-10-CM | POA: Insufficient documentation

## 2010-01-10 LAB — CONVERTED CEMR LAB
Blood Glucose, AC Bkfst: 317 mg/dL
Chloride: 100 meq/L (ref 96–112)
Hgb A1c MFr Bld: 11.6 %
Potassium: 3.4 meq/L — ABNORMAL LOW (ref 3.5–5.3)
Sodium: 140 meq/L (ref 135–145)

## 2010-01-23 ENCOUNTER — Ambulatory Visit: Payer: Self-pay | Admitting: Internal Medicine

## 2010-01-23 LAB — CONVERTED CEMR LAB: INR: 2.5

## 2010-02-01 ENCOUNTER — Ambulatory Visit: Payer: Self-pay | Admitting: Internal Medicine

## 2010-02-01 LAB — CONVERTED CEMR LAB
CO2: 25 meq/L (ref 19–32)
Calcium: 9.4 mg/dL (ref 8.4–10.5)
Chloride: 101 meq/L (ref 96–112)
Potassium: 4 meq/L (ref 3.5–5.3)
Sodium: 140 meq/L (ref 135–145)

## 2010-02-24 ENCOUNTER — Ambulatory Visit: Payer: Self-pay | Admitting: Internal Medicine

## 2010-02-27 ENCOUNTER — Ambulatory Visit: Payer: Self-pay | Admitting: Internal Medicine

## 2010-02-27 LAB — CONVERTED CEMR LAB: INR: 2.4

## 2010-03-02 ENCOUNTER — Telehealth: Payer: Self-pay | Admitting: Internal Medicine

## 2010-03-06 ENCOUNTER — Telehealth: Payer: Self-pay | Admitting: *Deleted

## 2010-03-07 ENCOUNTER — Encounter: Payer: Self-pay | Admitting: Licensed Clinical Social Worker

## 2010-03-17 ENCOUNTER — Ambulatory Visit: Payer: Self-pay | Admitting: Internal Medicine

## 2010-03-17 ENCOUNTER — Encounter: Payer: Self-pay | Admitting: Internal Medicine

## 2010-03-17 LAB — CONVERTED CEMR LAB: Blood Glucose, Fingerstick: 178

## 2010-03-19 LAB — CONVERTED CEMR LAB
Creatinine, Urine: 117.5 mg/dL
Microalb Creat Ratio: 197.9 mg/g — ABNORMAL HIGH (ref 0.0–30.0)

## 2010-04-07 ENCOUNTER — Encounter: Payer: Self-pay | Admitting: Licensed Clinical Social Worker

## 2010-04-10 ENCOUNTER — Encounter: Payer: Self-pay | Admitting: Internal Medicine

## 2010-04-10 ENCOUNTER — Ambulatory Visit: Payer: Self-pay | Admitting: Internal Medicine

## 2010-05-08 ENCOUNTER — Ambulatory Visit: Payer: Self-pay | Admitting: Internal Medicine

## 2010-05-08 LAB — CONVERTED CEMR LAB: INR: 2.1

## 2010-06-05 ENCOUNTER — Ambulatory Visit: Payer: Self-pay | Admitting: Internal Medicine

## 2010-06-30 ENCOUNTER — Encounter: Payer: Self-pay | Admitting: Internal Medicine

## 2010-06-30 ENCOUNTER — Ambulatory Visit: Payer: Self-pay | Admitting: Internal Medicine

## 2010-06-30 LAB — CONVERTED CEMR LAB
Blood Glucose, Fingerstick: 206
Hgb A1c MFr Bld: 10.6 %

## 2010-07-04 ENCOUNTER — Encounter: Payer: Self-pay | Admitting: Internal Medicine

## 2010-07-04 ENCOUNTER — Telehealth: Payer: Self-pay | Admitting: Internal Medicine

## 2010-07-07 ENCOUNTER — Encounter: Payer: Self-pay | Admitting: Internal Medicine

## 2010-07-07 ENCOUNTER — Ambulatory Visit: Payer: Self-pay | Admitting: Internal Medicine

## 2010-07-07 LAB — HM DIABETES FOOT EXAM

## 2010-07-10 ENCOUNTER — Ambulatory Visit: Payer: Self-pay | Admitting: Internal Medicine

## 2010-07-10 LAB — CONVERTED CEMR LAB

## 2010-07-25 ENCOUNTER — Telehealth: Payer: Self-pay | Admitting: Internal Medicine

## 2010-07-30 DIAGNOSIS — G4730 Sleep apnea, unspecified: Secondary | ICD-10-CM

## 2010-07-30 HISTORY — DX: Sleep apnea, unspecified: G47.30

## 2010-07-31 ENCOUNTER — Ambulatory Visit: Payer: Self-pay | Admitting: Internal Medicine

## 2010-08-11 ENCOUNTER — Ambulatory Visit: Admit: 2010-08-11 | Payer: Self-pay

## 2010-08-19 DIAGNOSIS — Z7901 Long term (current) use of anticoagulants: Secondary | ICD-10-CM

## 2010-08-19 DIAGNOSIS — I4891 Unspecified atrial fibrillation: Secondary | ICD-10-CM

## 2010-08-20 ENCOUNTER — Encounter: Payer: Self-pay | Admitting: Gastroenterology

## 2010-08-21 ENCOUNTER — Ambulatory Visit (HOSPITAL_BASED_OUTPATIENT_CLINIC_OR_DEPARTMENT_OTHER)
Admission: RE | Admit: 2010-08-21 | Discharge: 2010-08-21 | Payer: Self-pay | Source: Home / Self Care | Attending: Internal Medicine | Admitting: Internal Medicine

## 2010-08-28 ENCOUNTER — Ambulatory Visit: Admission: RE | Admit: 2010-08-28 | Discharge: 2010-08-28 | Payer: Self-pay | Source: Home / Self Care

## 2010-08-28 LAB — CONVERTED CEMR LAB

## 2010-08-29 NOTE — Letter (Signed)
Summary: BLOOD GLUCOSE   BLOOD GLUCOSE   Imported By: Garlan Fillers 07/04/2010 09:29:59  _____________________________________________________________________  External Attachment:    Type:   Image     Comment:   External Document

## 2010-08-29 NOTE — Miscellaneous (Signed)
Summary: Medicare D  Mailed information to patient about Senior [employer]/Medicare D program which has [place] location.

## 2010-08-29 NOTE — Miscellaneous (Signed)
Summary: LOG BOOK  REPORT 07/21-08/19/2011  LOG BOOK  REPORT 07/21-08/19/2011   Imported By: Enedina Finner 04/04/2010 14:20:05  _____________________________________________________________________  External Attachment:    Type:   Image     Comment:   External Document

## 2010-08-29 NOTE — Assessment & Plan Note (Signed)
Summary: ROUTINE CK/EST/VS   Vital Signs:  Patient profile:   65 year old male Height:      68 inches (172.72 cm) Weight:      223.4 pounds (102.82 kg) BMI:     34.09 Temp:     96.9 degrees F (36.06 degrees C) oral Pulse rate:   76 / minute BP sitting:   157 / 90  (right arm) Cuff size:   large  Vitals Entered By: Lucky Rathke NT II (October 11, 2009 10:33 AM) CC: ROUTINE OFFICE VISIT / DM /  CBG-A1C    / ,BLACK SPOTS ON LEGS Is Patient Diabetic? No Pain Assessment Patient in pain? no      Nutritional Status BMI of > 30 = obese CBG Result 335  Have you ever been in a relationship where you felt threatened, hurt or afraid?No   Does patient need assistance? Functional Status Self care Ambulation Normal Comments HER FOR ROUTINE  /  CBG  -A1C  / BLACK SPOTS ON  LEG    /    Primary Care Provider:  Dawna Part MD  CC:  ROUTINE OFFICE VISIT / DM /  CBG-A1C    /  and BLACK SPOTS ON LEGS.  History of Present Illness: Patrick [name] is a 65 year old Male with PMH/problems as outlined in the EMR, who presents to the clinic for follow up on his chronic problems. He is accompanied by his wife today. He is doing all his meds as prescribed except for his insulin. He says he would like to have his diabetes controlled with oral meds if possible. He doesn't have any new problems today.   Depression History:      The patient denies a depressed mood most of the day and a diminished interest in his usual daily activities.         Preventive Screening-Counseling & Management  Alcohol-Tobacco     Smoking Status: quit     Year Quit: 1977     Passive Smoke Exposure: no  Caffeine-Diet-Exercise     Does Patient Exercise: yes     Type of exercise:     WALKING     Times/week: 2  Current Medications (verified): 1)  Aspirin 81 Mg Tbec (Aspirin) .... Take 1 Tablet By Mouth Once A Day 2)  Benazepril Hcl 40 Mg Tabs (Benazepril Hcl) .... Take 2 Tablets By Mouth Once A Day 3)  Glipizide 10 Mg  Tabs (Glipizide) .... Take 1and Half Tablet By Mouth Twice A Day 4)  Pravachol 40 Mg Tabs (Pravastatin Sodium) .... Take 2  Tablets By Mouth Once A Day 5)  Famotidine 20 Mg Tabs (Famotidine) .... Take 2 Tablets By Mouth Once A Day 6)  Catapres 0.2 Mg Tabs (Clonidine Hcl) .... Take 1 Tablet By Mouth Two Times A Day 7)  Furosemide 80 Mg Tabs (Furosemide) .... Take 1 and Half Pill A Day. 8)  Truetrack Test  Strp (Glucose Blood) .... Use To Test Blood Sugar 2-3 Times Daily 9)  Lancets 30g  Misc (Lancets) .... Use To Test Blood Glucose 2-3 Times Daily 10)  Warfarin Sodium 5 Mg Tabs (Warfarin Sodium) .... Take As Directed. 11)  Cardizem 90 Mg Tabs (Diltiazem Hcl) .... Take One and Half Pill Twice A Day. 12)  Lantus 100 Unit/ml Soln (Insulin Glargine) .Marland Kitchen.. 15 Units Subcutanous Ly Daily 13)  Fish Oil 1000 Mg Caps (Omega-3 Fatty Acids) .Marland Kitchen.. 1 Capsule Twice Daily  Allergies (verified): No Known Drug Allergies  Past  History:  Past Medical History: Last updated: 06/09/2008 Hypertension:Uncontrolled                       H/o medical noncompliance                       Hospitalised*2 for HTN urgency                       w/u for refractory HTN neg incl RAS(neg MRA) Non-obstructive [id number] cath 9/05 by [facility]                                     20-30%proxRCA,30%proxLAD Diabetes mellitus, type II Hyperlipidemia H/o CVA:Rt Cerebellar hemorrhage 1/07 Chronic Kidney disease:Stage 3                                       Secondary to poorly controlled HTN                                       Baseline creatinine 2.0-2.2 but recent values 1.7.                                       Followed @[facility] ,Dr.Goldsborough Paroxysmal Atrial Fibrillation:2D ECHO(5/07):LVef 60-70%,mild aortic root dilatation,LA mod dilated,A.fib                                                         Off anticoagulation sec to cerebellar hemorrhage H/o Bradycardia:with mild pause 1.2sec while on beta blocker  1/07.                           EP:No pacer needed.No evidence of SAN dysfunction Depression Gout:First episode 11/07;Hospitalized Mildly enlarged prostate on Renal U/S 1/07 Sleep apnea  Family History: Last updated: 09/19/2006 Brother had CA of unknown origin. Currently deceased.  Social History: Last updated: 06/09/2008 Married Quit smoking and no etoh in 32 years ago  Risk Factors: Exercise: yes (10/11/2009)  Risk Factors: Smoking Status: quit (10/11/2009) Passive Smoke Exposure: no (10/11/2009)  Review of Systems       as per hPI.   Physical Exam  General:  alert and well-developed.   Lungs:  normal respiratory effort and normal breath sounds.   Heart:  irregularly irregular Abdomen:  soft and non-tender.   Pulses:  normal peripheral pulses  Extremities:  no cyanosis, clubbing or edema  Neurologic:  non focal.  Psych:  normally interactive.     Impression & Recommendations:  Problem # 1:  DIABETES MELLITUS, TYPE II (ICD-250.00)  Discussed at length about getting on insulin. I explained to the patient that insulin will be the only treatment that will help with his A1c significantly. Patient agrees to come back and discuss this with Ms. Barnabas Harries.  DATA REVIEWED: A1c: 13.4 (10/11/2009 10:17:25 AM)  MICROALB/CR: 627.0 (12/20/2008 9:02:00 PM) LDL: 62 (04/22/2009 8:26:00 PM) EYE: No  diabetic retinopathy.   Significant arteriosclerosis Cataract.   Hypertensive retinopathy  (07/13/2008 2:51:47 PM) FOOT: yes (04/06/2009 1:22:29 PM)  His updated medication list for this problem includes:    Aspirin 81 Mg Tbec (Aspirin) .Marland Kitchen... Take 1 tablet by mouth once a day    Benazepril Hcl 40 Mg Tabs (Benazepril hcl) .Marland Kitchen... Take 2 tablets by mouth once a day    Glipizide 10 Mg Tabs (Glipizide) .Marland Kitchen... Take 1and half tablet by mouth twice a day    Lantus 100 Unit/ml Soln (Insulin glargine) .Marland KitchenMarland KitchenMarland KitchenMarland Kitchen 15 units subcutanous ly daily  Orders: T- Capillary Blood Glucose ([id number]) T-Hgb  A1C (in-house) [id number])  Problem # 2:  HYPERTENSION (ICD-401.9) Elevated BP noted and confirmed manually. I will increase cardizem to 180 two times a day.  His updated medication list for this problem includes:    Benazepril Hcl 40 Mg Tabs (Benazepril hcl) .Marland Kitchen... Take 2 tablets by mouth once a day    Catapres 0.2 Mg Tabs (Clonidine hcl) .Marland Kitchen... Take 1 tablet by mouth two times a day    Furosemide 80 Mg Tabs (Furosemide) .Marland Kitchen... Take 1 and half pill a day.    Cardizem 90 Mg Tabs (Diltiazem hcl) .Marland Kitchen..Marland Kitchen Two tabs twice a day  Problem # 3:  HYPERLIPIDEMIA (ICD-272.4) Continue with pravachol.  Chol: 137 (04/22/2009 8:26:00 PM)HDL:  38 (04/22/2009 8:26:00 PM)LDL:  62 (04/22/2009 8:26:00 PM)Tri:   AST:  25 (12/20/2008 9:02:00 PM) ALT:  30 (12/20/2008 9:02:00 PM)T. Bili:  0.6 (12/20/2008 9:02:00 PM) AP:  94 (12/20/2008 9:02:00 PM)   His updated medication list for this problem includes:    Pravachol 40 Mg Tabs (Pravastatin sodium) .Marland Kitchen... Take 2  tablets by mouth once a day  Problem # 4:  ATRIAL FIBRILLATION, PAROXYSMAL, CHRONIC (ICD-427.31) Rate controlled. Coumadin management per Dr. Elie Confer.  His updated medication list for this problem includes:    Aspirin 81 Mg Tbec (Aspirin) .Marland Kitchen... Take 1 tablet by mouth once a day    Warfarin Sodium 5 Mg Tabs (Warfarin sodium) .Marland Kitchen... Take as directed.    Cardizem 90 Mg Tabs (Diltiazem hcl) .Marland Kitchen..Marland Kitchen Two tabs twice a day  Complete Medication List: 1)  Aspirin 81 Mg Tbec (Aspirin) .... Take 1 tablet by mouth once a day 2)  Benazepril Hcl 40 Mg Tabs (Benazepril hcl) .... Take 2 tablets by mouth once a day 3)  Glipizide 10 Mg Tabs (Glipizide) .... Take 1and half tablet by mouth twice a day 4)  Pravachol 40 Mg Tabs (Pravastatin sodium) .... Take 2  tablets by mouth once a day 5)  Famotidine 20 Mg Tabs (Famotidine) .... Take 2 tablets by mouth once a day 6)  Catapres 0.2 Mg Tabs (Clonidine hcl) .... Take 1 tablet by mouth two times a day 7)  Furosemide 80 Mg Tabs  (Furosemide) .... Take 1 and half pill a day. 8)  Truetrack Test Strp (Glucose blood) .... Use to test blood sugar 2-3 times daily 9)  Lancets 30g Misc (Lancets) .... Use to test blood glucose 2-3 times daily 10)  Warfarin Sodium 5 Mg Tabs (Warfarin sodium) .... Take as directed. 11)  Cardizem 90 Mg Tabs (Diltiazem hcl) .... Two tabs twice a day 12)  Lantus 100 Unit/ml Soln (Insulin glargine) .Marland Kitchen.. 15 units subcutanous ly daily 13)  Fish Oil 1000 Mg Caps (Omega-3 fatty acids) .Marland Kitchen.. 1 capsule twice daily  Other Orders: T-Comprehensive Metabolic Panel ([id number])  Patient Instructions: 1)  Please schedule an appointment to see Ms. Barnabas Harries for help with diabetes and  nutrition education. Please bring in your glucose meter with you when you come to see her.  2)  Please schedule a follow-up appointment in 1 month. 3)  We will let you know if anything wrong with your lab work.   Prescriptions: CARDIZEM 90 MG TABS (DILTIAZEM HCL) Two tabs twice a day  #120 x 5   Entered and Authorized by:   Dawna Part MD   Signed by:   Dawna Part MD on 10/11/2009   Method used:   Electronically to        [facility] (retail)       [phone]       [place], [place]  [address]       Ph: [phone]       Fax: [phone]   RxID:   [id number]  Process Orders Check Orders Results:     [facility]: Check successful Tests Sent for requisitioning (October 11, 2009 1:48 PM):     10/11/2009: [facility] -- T-Comprehensive Metabolic Panel [id number] (signed)    Prevention & Chronic Care Immunizations   Influenza vaccine: Fluvax 3+  (05/26/2009)   Influenza vaccine due: 06/09/2009    Tetanus booster: Not documented    Pneumococcal vaccine: Pneumovax (Medicare)  (05/26/2009)    H. zoster vaccine: Not documented  Colorectal Screening   Hemoccult: Not documented    Colonoscopy: Benign polyps.   (12/04/2007)    Colonoscopy due: 11/2012  Other Screening   PSA: Not documented   Smoking status: quit  (10/11/2009)  Diabetes Mellitus   HgbA1C: 13.4  (10/11/2009)   Hemoglobin A1C due: 12/12/2008    Eye exam: No diabetic retinopathy.   Significant arteriosclerosis Cataract.   Hypertensive retinopathy   (07/13/2008)   Eye exam due: 12/2008    Foot exam: yes  (04/06/2009)   Foot exam action/deferral: Do today   High risk foot: No  (12/30/2006)   Foot care education: Done  (04/06/2009)   Foot exam due: 12/30/2007    Urine microalbumin/creatinine ratio: 627.0  (12/20/2008)   Urine microalbumin/cr due: 06/09/2009    Diabetes flowsheet reviewed?: Yes   Progress toward A1C goal: Unchanged  Lipids   Total Cholesterol: 137  (04/22/2009)   LDL: 62  (04/22/2009)   LDL Direct: Not documented   HDL: 38  (04/22/2009)   Triglycerides: 185  (04/22/2009)    SGOT (AST): 25  (12/20/2008)   SGPT (ALT): 30  (12/20/2008) CMP ordered    Alkaline phosphatase: 94  (12/20/2008)   Total bilirubin: 0.6  (12/20/2008)  Hypertension   Last Blood Pressure: 157 / 90  (10/11/2009)   Serum creatinine: 1.94  (05/26/2009)   BMP action: Ordered   Serum potassium 4.0  (05/26/2009) CMP ordered   Self-Management Support :   Personal Goals (by the next clinic visit) :     Personal A1C goal: 7  (06/27/2009)     Personal blood pressure goal: 130/80  (06/27/2009)     Personal LDL goal: 70  (06/27/2009)    Patient will work on the following items until the next clinic visit to reach self-care goals:     Medications and monitoring: take my medicines every day, check my blood sugar, bring all of my medications to every visit, examine my feet every day  (10/11/2009)     Eating: drink diet soda or water instead of juice or soda, eat more vegetables, use fresh or frozen vegetables, eat foods that  are low in salt, eat baked foods instead of fried foods, limit or avoid alcohol  (10/11/2009)     Activity: take a 30 minute  walk every day  (10/11/2009)    Diabetes self-management support: Resources for patients handout  (10/11/2009)   Last diabetes self-management training by diabetes educator: 07/19/2008   Last medical nutrition therapy: 10/12/2008    Hypertension self-management support: Resources for patients handout  (10/11/2009)    Lipid self-management support: Resources for patients handout  (10/11/2009)        Resource handout printed.  Laboratory Results   Blood Tests   Date/Time Received: October 11, 2009 11:01 AM  Date/Time Reported: Lenoria Farrier  October 11, 2009 11:01 AM   HGBA1C: 13.4%   (Normal Range: Non-Diabetic - 3-6%   Control Diabetic - 6-8%) CBG Random:: 335mg /dL  Comments: Patient stated that he ate a lemon only  Lenoria Farrier  October 11, 2009 11:02 AM

## 2010-08-29 NOTE — Assessment & Plan Note (Signed)
Summary: COU/VS  Anticoagulant Therapy Managed by: [name]. Patrick [name]  PharmD CACP Referring MD: Dawna Part MD PCP: Dawna Part MD [facility] Attending: Verneda Skill MD Indication 1: Atrial fibrillation Indication 2: Aftercare long term use Anticoagulants V58.61,V58.83 Start date: 10/09/2006 Duration: Indefinite  Patient Assessment Reviewed by: [name] PharmD  October 17, 2009 Medication review: verified warfarin dosage & schedule,verified previous prescription medications, verified doses & any changes, verified new medications, reviewed OTC medications, reviewed OTC health products-vitamins supplements etc Complications: none Dietary changes: none   Health status changes: none   Lifestyle changes: none   Recent/future hospitalizations: none   Recent/future procedures: none   Recent/future dental: none Patient Assessment Part 2:  Have you MISSED ANY DOSES or CHANGED TABLETS?  No missed Warfarin doses or changed tablets.  Have you had any BRUISING or BLEEDING ( nose or gum bleeds,blood in urine or stool)?  No reported bruising or bleeding in nose, gums, urine, stool.  Have you STARTED or STOPPED any MEDICATIONS, including OTC meds,herbals or supplements?  No other medications or herbal supplements were started or stopped.  Have you CHANGED your DIET, especially green vegetables,or ALCOHOL intake?  No changes in diet or alcohol intake.  Have you had any ILLNESSES or HOSPITALIZATIONS?  No reported illnesses or hospitalizations  Have you had any signs of CLOTTING?(chest discomfort,dizziness,shortness of breath,arms tingling,slurred speech,swelling or redness in leg)    No chest discomfort, dizziness, shortness of breath, tingling in arm, slurred speech, swelling, or redness in leg.     Treatment  Target INR: 2.0-3.0 INR: 2.3  Date: 10/17/2009 Regimen In:  30.0mg /week INR reflects regimen in: 2.3  New  Tablet strength: : 5mg  Regimen Out:     Sunday: 1 Tablet  Monday: 1 Tablet     Tuesday: 1 Tablet     Wednesday: 1/2 Tablet     Thursday: 1 Tablet      Friday: 1 Tablet     Saturday: 1 Tablet Total Weekly: 32.5mg /week mg  Next INR Due: 11/14/2009 Adjusted by: [name]. Elie Confer III PharmD CACP   Return to anticoagulation clinic:  11/14/2009 Time of next visit: 1415    Allergies: No Known Drug Allergies

## 2010-08-29 NOTE — Miscellaneous (Signed)
Summary: SW Referral   Social Work Evaluation Date  03/07/2010 Patient name Patrick [name]  Primary MD   : Dawna Part MD Social Worker's name : Katy Fitch MSW- [place] [id number]    Cell phone: .  Marland Kitchen     Alternate phone: . Marland Kitchen       Individual making referral: Barnabas Harries  Primary Reason for Referral:     Assist with Medicare D/Medicaid Eligibility/Medication Assist Comments Patient needing assist with Medicare D.    Contacted patient by phone.  He receives $1230 in Disability after his Medicare B premium is taken out.   His wife receives $139 per week in unemployment.   I recommended that he go to [employer] for a [employer] D consultation since he never signed up for a plan.   Action taken by Social Work: I called Duard Brady at [employer] who is sending me brochure to send on to Mr. Schrag so he can visit with [employer] in October at the [facility] D open enrollment.  In the meantime, Mr. Wools is getting his meds at [facility] and mostly all his meds are on the $4 list except for the insulin.   Mr. Verderame is very non-committal as to whether he wants to explore getting onto a Medicare D program.  I told him that I would send him the information so he could make an appointment in the Fall and possibly sign up for a plan.

## 2010-08-29 NOTE — Progress Notes (Signed)
Summary: med for gout/gp  Phone Note Call from Patient   Caller: Patient Summary of Call: Pt. requesting medication for gout. States he has gout in right big toe;has taken something before but he does not remember. States having pain in his toe. Initial call taken by: Morrison Old RN,  Dec 02, 2009 11:04 AM  Follow-up for Phone Call        Lets give her some prednisone then. She needs to come into [facility] if she doesnt imporove or gets worse Follow-up by: Alcide Evener MD,  Dec 02, 2009 1:59 PM    New/Updated Medications: PREDNISONE 20 MG TABS (PREDNISONE) take two tablets for 4 days, then one tablet for 6 days for gout Prescriptions: PREDNISONE 20 MG TABS (PREDNISONE) take two tablets for 4 days, then one tablet for 6 days for gout  #14 x 1   Entered and Authorized by:   Alcide Evener MD   Signed by:   Rhina Brackett Dam MD on 12/02/2009   Method used:   Electronically to        [facility].* (retail)       [phone]       [place], [place]  [address]       Ph: [phone]       Fax: [phone]   [id number]   Appended Document: med for gout/gp Pt was called about new Rx.

## 2010-08-29 NOTE — Assessment & Plan Note (Signed)
Summary: 261/cfb  Anticoagulant Therapy Managed by: [name]. Ceasar [name]  PharmD CACP Referring MD: Patrick Part MD PCP: Patrick Part MD [facility] AttendingNance Pew MD, Laurene Footman Indication 1: Atrial fibrillation Indication 2: Aftercare long term use Anticoagulants V58.61,V58.83 Start date: 10/09/2006 Duration: Indefinite  Patient Assessment Reviewed by: [name] PharmD  January 23, 2010 Medication review: verified warfarin dosage & schedule,verified previous prescription medications, verified doses & any changes, verified new medications, reviewed OTC medications, reviewed OTC health products-vitamins supplements etc Complications: none Dietary changes: none   Health status changes: none   Lifestyle changes: none   Recent/future hospitalizations: none   Recent/future procedures: none   Recent/future dental: none Patient Assessment Patrick [name] 2:  Have you MISSED ANY DOSES or CHANGED TABLETS?  No missed Warfarin doses or changed tablets.  Have you had any BRUISING or BLEEDING ( nose or gum bleeds,blood in urine or stool)?  No reported bruising or bleeding in nose, gums, urine, stool.  Have you STARTED or STOPPED any MEDICATIONS, including OTC meds,herbals or supplements?  No other medications or herbal supplements were started or stopped.  Have you CHANGED your DIET, especially green vegetables,or ALCOHOL intake?  No changes in diet or alcohol intake.  Have you had any ILLNESSES or HOSPITALIZATIONS?  No reported illnesses or hospitalizations  Have you had any signs of CLOTTING?(chest discomfort,dizziness,shortness of breath,arms tingling,slurred speech,swelling or redness in leg)    No chest discomfort, dizziness, shortness of breath, tingling in arm, slurred speech, swelling, or redness in leg.     Treatment  Target INR: 2.0-3.0 INR: 2.5  Date: 01/23/2010 Regimen In:  32.5mg /week INR reflects regimen in: 2.5  New  Tablet strength: : 5mg  Regimen Out:     Sunday: 1 Tablet  Monday: 1 Tablet     Tuesday: 1 Tablet     Wednesday: 1/2 Tablet     Thursday: 1 Tablet      Friday: 1 Tablet     Saturday: 1 Tablet Total Weekly: 32.5mg /week mg  Next INR Due: 02/27/2010 Adjusted by: [name]. Elie Confer III PharmD CACP   Return to anticoagulation clinic:  02/27/2010 Time of next visit: 1430    Allergies: No Known Drug Allergies

## 2010-08-29 NOTE — Assessment & Plan Note (Signed)
Summary: EST-1 WEEK RECHECK FOR BP/CH   Vital Signs:  Patient profile:   65 year old male Height:      68 inches (172.72 cm) Weight:      227.5 pounds (103.41 kg) BMI:     34.72 Temp:     96.7 degrees F (35.94 degrees C) oral Pulse rate:   83 / minute BP sitting:   132 / 81  (left arm)  Vitals Entered By: Hilda Blades Ditzler RN (July 07, 2010 9:32 AM) Is Patient Diabetic? Yes Did you bring your meter with you today? Yes Pain Assessment Patient in pain? no      Nutritional Status BMI of > 30 = obese Nutritional Status Detail appetite good CBG Result 324  Have you ever been in a relationship where you felt threatened, hurt or afraid?denies   Does patient need assistance? Functional Status Self care Ambulation Normal Comments FU - better.   Primary Care Provider:  Trish Fountain MD   History of Present Illness: Patient is a 65 year old man who presents today for a follow up from his last visit two weeks ago.  His blood pressure at his last visit was significantly elevated because he had been out of one of his medications for 2-3 days.  He has gotten them refilled and has been taking them for the last 2 weeks and today his blood pressure is much improved.  He also states he feels "better" and that he didn't realize having his blood pressure so high was making his head feel "full."  He has been taking all of his other medications as prescribed.  He brought his meter in today.  He has only been reliably checking his blood sugars for the last 5 days so I don't have a very good idea of what to go on but he has had no hypoglycemia episodes.  He also doesn't have any fasting blood sugars listed.      Depression History:      The patient denies a depressed mood most of the day and a diminished interest in his usual daily activities.        The patient denies that he feels like life is not worth living, denies that he wishes that he were dead, and denies that he has thought about  ending his life.         Preventive Screening-Counseling & Management  Alcohol-Tobacco     Alcohol drinks/day: 0     Smoking Status: quit     Year Quit: 87yrs ago     Passive Smoke Exposure: no  Caffeine-Diet-Exercise     Does Patient Exercise: yes     Type of exercise:     WALKING     Times/week: 2  Current Medications (verified): 1)  Aspirin 81 Mg Tbec (Aspirin) .... Take 1 Tablet By Mouth Once A Day 2)  Benazepril Hcl 40 Mg Tabs (Benazepril Hcl) .... Take 2 Tablets By Mouth Once A Day 3)  Glipizide 10 Mg Tabs (Glipizide) .... Take 1and Half Tablet By Mouth Twice A Day 4)  Pravachol 40 Mg Tabs (Pravastatin Sodium) .... Take 2  Tablets By Mouth Once A Day 5)  Famotidine 20 Mg Tabs (Famotidine) .... Take 2 Tablets By Mouth Once A Day 6)  Clonidine Hcl 0.3 Mg Tabs (Clonidine Hcl) .... Take 1 Tablet By Mouth Two Times A Day For Blood Pressure 7)  Furosemide 80 Mg Tabs (Furosemide) .... Take 1 and Half Pill A Day. 8)  Truetrack  Test  Strp (Glucose Blood) .... Use To Test Blood Sugar 2-3 Times Daily 9)  Lancets 30g  Misc (Lancets) .... Use To Test Blood Glucose 2-3 Times Daily 10)  Warfarin Sodium 5 Mg Tabs (Warfarin Sodium) .... Take As Directed. 11)  Cardizem 90 Mg Tabs (Diltiazem Hcl) .... Two Tabs Twice A Day 12)  Fish Oil 1000 Mg Caps (Omega-3 Fatty Acids) .Marland Kitchen.. 1 Capsule Twice Daily 13)  Humulin N 100 Unit/ml Susp (Insulin Isophane Human) .... Inject 25 Units At Bedtime. 14)  Elite-Thin Insulin Syringe 31g X 5/16" 0.5 Ml Misc (Insulin Syringe-Needle U-100) .... Use To Inject Insulin Daily (250) 15)  Relion Insulin Syringe 31g X 5/16" 0.5 Ml Misc (Insulin Syringe-Needle U-100) .... Use To Inject Relion Humulin Insulin Once Time A Day  (250.00)  Allergies (verified): No Known Drug Allergies  Past History:  Past medical, surgical, family and social histories (including risk factors) reviewed, and no changes noted (except as noted below).  Past Medical History: Reviewed history  from 06/09/2008 and no changes required. Hypertension:Uncontrolled                       H/o medical noncompliance                       Hospitalised*2 for HTN urgency                       w/u for refractory HTN neg incl RAS(neg MRA) Non-obstructive [id number] cath 9/05 by [facility]                                     20-30%proxRCA,30%proxLAD Diabetes mellitus, type II Hyperlipidemia H/o CVA:Rt Cerebellar hemorrhage 1/07 Chronic Kidney disease:Stage 3                                       Secondary to poorly controlled HTN                                       Baseline creatinine 2.0-2.2 but recent values 1.7.                                       Followed @[facility] ,Dr.Goldsborough Paroxysmal Atrial Fibrillation:2D ECHO(5/07):LVef 60-70%,mild aortic root dilatation,LA mod dilated,A.fib                                                         Off anticoagulation sec to cerebellar hemorrhage H/o Bradycardia:with mild pause 1.2sec while on beta blocker 1/07.                           EP:No pacer needed.No evidence of SAN dysfunction Depression Gout:First episode 11/07;Hospitalized Mildly enlarged prostate on Renal U/S 1/07 Sleep apnea  Family History: Reviewed history from 06/30/2010 and no changes required. Brother died of CA of unknown origin.   Social History: Reviewed history from  06/09/2008 and no changes required. Married Quit smoking and no etoh in 32 years ago  Review of Systems  The patient denies anorexia, fever, weight loss, weight gain, vision loss, decreased hearing, hoarseness, chest pain, syncope, dyspnea on exertion, peripheral edema, prolonged cough, headaches, hemoptysis, abdominal pain, melena, hematochezia, severe indigestion/heartburn, hematuria, incontinence, genital sores, muscle weakness, suspicious skin lesions, transient blindness, difficulty walking, depression, unusual weight change, abnormal bleeding, enlarged lymph nodes, angioedema, and  testicular masses.    Physical Exam  Additional Exam:  General: Well developed, male in no acute distress.  Vital signs reviewed.  Head: Atraumatic, normocephalic with no signs of trauma  Eyes: PERRLA, EOM intact  Ears: TM intact  Nose: Nares patent, mucosa is pink and moist, no polyps noted  Mouth: Mucosa is pink and moist, good dention,   Neck: Supple, full ROM, no thyromegaly or masses noted  Resp: Clear to ascultation bilaterally, no wheezes, rales, or rhonchi noted  CV: Regular rate and rhythm with no murmurs, rubs, or gallops noted  Abdomen: Soft, non-tender, non-distended with normal bowel sounds  Musculoskelatal: ROM full with intact strength, no pain to palpation  Neurologic: Alert and oriented x3, Cranial nerves II-XII grossly intact, DTR normal and symmetric  Pulses: Radial, brachial, carotid, femoral, dorsal pedis, and posterior tibial pulses equal and symmetric    Diabetes Management Exam:    Foot Exam (with socks and/or shoes not present):       Sensory-Monofilament:          Left foot: normal          Right foot: normal   Impression & Recommendations:  Problem # 1:  HYPERTENSION (ICD-401.9) Blood pressure was much better today.  He has been taking his medications.  He is still a bit above his goal.  With his history of sleep apnea I would like for him to do a sleep study since chronic sleep apnea can be a cause of hard to control hypertension.  We will refer him and see if it helps his blood pressure.  His updated medication list for this problem includes:    Benazepril Hcl 40 Mg Tabs (Benazepril hcl) .Marland Kitchen... Take 2 tablets by mouth once a day    Clonidine Hcl 0.3 Mg Tabs (Clonidine hcl) .Marland Kitchen... Take 1 tablet by mouth two times a day for blood pressure    Furosemide 80 Mg Tabs (Furosemide) .Marland Kitchen... Take 1 and half pill a day.    Cardizem 90 Mg Tabs (Diltiazem hcl) .Marland Kitchen..Marland Kitchen Two tabs twice a day  BP today: 132/81 Prior BP: 199/110 (06/30/2010)  Labs Reviewed: K+: 4.0  (02/01/2010) Creat: : 1.93 (02/01/2010)   Chol: 137 (04/22/2009)   HDL: 38 (04/22/2009)   LDL: 62 (04/22/2009)   TG: 185 (04/22/2009)  Orders: Sleep Disorder Referral (Sleep Disorder)  Problem # 2:  DIABETES MELLITUS, TYPE II (ICD-250.00) His blood sugars have been increase still even with the increase in the Humulin.  We will continue to follow them and at his follow up adjust the long acting insulin and possibly add insulin with meals if that is where his problem lies.  He will continue to check his blood sugars once daily at alternating times to help [name] know what needs to be changed.   His updated medication list for this problem includes:    Aspirin 81 Mg Tbec (Aspirin) .Marland Kitchen... Take 1 tablet by mouth once a day    Benazepril Hcl 40 Mg Tabs (Benazepril hcl) .Marland Kitchen... Take 2 tablets by mouth once a  day    Glipizide 10 Mg Tabs (Glipizide) .Marland Kitchen... Take 1and half tablet by mouth twice a day    Humulin N 100 Unit/ml Susp (Insulin isophane human) ..... Inject 25 units at bedtime.  Orders: Capillary Blood Glucose/CBG [id number]) Capillary Blood Glucose/CBG RC:[id number])  Complete Medication List: 1)  Aspirin 81 Mg Tbec (Aspirin) .... Take 1 tablet by mouth once a day 2)  Benazepril Hcl 40 Mg Tabs (Benazepril hcl) .... Take 2 tablets by mouth once a day 3)  Glipizide 10 Mg Tabs (Glipizide) .... Take 1and half tablet by mouth twice a day 4)  Pravachol 40 Mg Tabs (Pravastatin sodium) .... Take 2  tablets by mouth once a day 5)  Famotidine 20 Mg Tabs (Famotidine) .... Take 2 tablets by mouth once a day 6)  Clonidine Hcl 0.3 Mg Tabs (Clonidine hcl) .... Take 1 tablet by mouth two times a day for blood pressure 7)  Furosemide 80 Mg Tabs (Furosemide) .... Take 1 and half pill a day. 8)  Truetrack Test Strp (Glucose blood) .... Use to test blood sugar 2-3 times daily 9)  Lancets 30g Misc (Lancets) .... Use to test blood glucose 2-3 times daily 10)  Warfarin Sodium 5 Mg Tabs (Warfarin sodium) .... Take as  directed. 11)  Cardizem 90 Mg Tabs (Diltiazem hcl) .... Two tabs twice a day 12)  Fish Oil 1000 Mg Caps (Omega-3 fatty acids) .Marland Kitchen.. 1 capsule twice daily 13)  Humulin N 100 Unit/ml Susp (Insulin isophane human) .... Inject 25 units at bedtime. 14)  Elite-thin Insulin Syringe 31g X 5/16" 0.5 Ml Misc (Insulin syringe-needle u-100) .... Use to inject insulin daily (250) 15)  Relion Insulin Syringe 31g X 5/16" 0.5 Ml Misc (Insulin syringe-needle u-100) .... Use to inject relion humulin insulin once time a day  (250.00)  Patient Instructions: 1)  Continue taking your medications as prescribed. 2)  Continue checking your blood sugars daily at the different times and remember to bring your meter with you at your follow up. 3)  The Sleep center will call  you to schedule your sleep study. 4)  Follow up in 4-6 weeks.   Orders Added: 1)  Capillary Blood Glucose/CBG [82948] 2)  Capillary Blood Glucose/CBG [82948] 3)  Sleep Disorder Referral [Sleep Disorder] 4)  Est. Patient Level III [id number]     Prevention & Chronic Care Immunizations   Influenza vaccine: Fluvax 3+  (05/08/2010)   Influenza vaccine deferral: Not available  (02/01/2010)   Influenza vaccine due: 03/30/2010    Tetanus booster: Not documented   Td booster deferral: Refused  (06/30/2010)   Tetanus booster due: 07/14/2010    Pneumococcal vaccine: Pneumovax (Medicare)  (05/26/2009)    H. zoster vaccine: Not documented   H. zoster vaccine deferral: Deferred  (01/10/2010)  Colorectal Screening   Hemoccult: Not documented   Hemoccult action/deferral: Not indicated  (03/17/2010)    Colonoscopy: Benign polyps.   (12/04/2007)   Colonoscopy due: 12/03/2012  Other Screening   PSA: Not documented   PSA action/deferral: Discussed-PSA declined  (06/30/2010)   Smoking status: quit  (07/07/2010)  Diabetes Mellitus   HgbA1C: 10.6  (06/30/2010)   Hemoglobin A1C due: 12/12/2008    Eye exam: No diabetic retinopathy.      (04/10/2010)   Diabetic eye exam action/deferral: Ophthalmology referral  (03/17/2010)   Eye exam due: 03/2011    Foot exam: yes  (07/07/2010)   Foot exam action/deferral: Do today   High risk foot: Yes  (07/07/2010)   Foot care  education: Done  (06/30/2010)   Foot exam due: 09/29/2010    Urine microalbumin/creatinine ratio: 197.9  (03/17/2010)   Urine microalbumin action/deferral: Ordered   Urine microalbumin/cr due: 06/09/2009    Diabetes flowsheet reviewed?: Yes   Progress toward A1C goal: Improved  Lipids   Total Cholesterol: 137  (04/22/2009)   LDL: 62  (04/22/2009)   LDL Direct: Not documented   HDL: 38  (04/22/2009)   Triglycerides: 185  (04/22/2009)    SGOT (AST): 27  (10/11/2009)   SGPT (ALT): 34  (10/11/2009)   Alkaline phosphatase: 96  (10/11/2009)   Total bilirubin: 0.7  (10/11/2009)    Lipid flowsheet reviewed?: Yes   Progress toward LDL goal: At goal  Hypertension   Last Blood Pressure: 132 / 81  (07/07/2010)   Serum creatinine: 1.93  (02/01/2010)   BMP action: Ordered   Serum potassium 4.0  (02/01/2010)    Hypertension flowsheet reviewed?: Yes   Progress toward BP goal: Improved  Self-Management Support :   Personal Goals (by the next clinic visit) :     Personal A1C goal: 7  (06/27/2009)     Personal blood pressure goal: 130/80  (06/27/2009)     Personal LDL goal: 70  (06/27/2009)    Patient will work on the following items until the next clinic visit to reach self-care goals:     Medications and monitoring: take my medicines every day, check my blood sugar, check my blood pressure, bring all of my medications to every visit, examine my feet every day  (07/07/2010)     Eating: drink diet soda or water instead of juice or soda, eat more vegetables, use fresh or frozen vegetables, eat foods that are low in salt, eat fruit for snacks and desserts, limit or avoid alcohol  (07/07/2010)     Activity: take a 30 minute walk every day  (07/07/2010)     Diabetes self-management support: Copy of home glucose meter record, Written self-care plan, Education handout, Resources for patients handout  (07/07/2010)   Diabetes care plan printed   Diabetes education handout printed   Last diabetes self-management training by diabetes educator: 02/24/2010   Last medical nutrition therapy: 10/12/2008    Hypertension self-management support: Written self-care plan, Education handout, Resources for patients handout  (07/07/2010)   Hypertension self-care plan printed.   Hypertension education handout printed    Lipid self-management support: Written self-care plan, Education handout, Resources for patients handout  (07/07/2010)   Lipid self-care plan printed.   Lipid education handout printed      Resource handout printed.   Last LDL:                                                 62 (04/22/2009 8:26:00 PM)        Diabetic Foot Exam Foot Inspection Is there a history of a foot ulcer?              No Is there a foot ulcer now?              No Can the patient see the bottom of their feet?          Yes Are the shoes appropriate in style and fit?          Yes Is there swelling or an abnormal foot shape?  No Are the toenails long?                No Are the toenails thick?                No Are the toenails ingrown?              No Is there heavy callous build-up?              No Is there a claw toe deformity?                          No Is there elevated skin temperature?            No Is there limited ankle dorsiflexion?            No Is there foot or ankle muscle weakness?            No Do you have pain in calf while walking?           No      Pulse Check          Right Foot          Left Foot Posterior Tibial:        1+            1+ Dorsalis Pedis:        1+            1+  High Risk Feet? Yes   10-g (5.07) Semmes-Weinstein Monofilament Test Performed by: Hilda Blades Ditzler RN          Right Foot          Left Foot Visual  Inspection              normal Test Control                  Site 1         normal         normal Site 2         normal         normal Site 3         normal         normal Site 4         normal         normal Site 5         normal         normal Site 6         normal         normal Site 7         normal         normal Site 8         normal         normal Site 9         normal         normal Site 10         normal         normal  Impression      normal         normal   Appended Document: EST-1 WEEK RECHECK FOR BP/CH Mr. Castles' history and physical were reviewed with Dr. Obie Dredge and the assessment and plan were formulated together.  His hypertension is much improved when taking the ACEI.  I agree with the above documentation.

## 2010-08-29 NOTE — Assessment & Plan Note (Signed)
Summary: EST-3 WEEK RECHECK/CH   Vital Signs:  Patient profile:   65 year old [name] Height:      68 inches (172.72 cm) Weight:      216.5 pounds (98.41 kg) BMI:     33.04 Temp:     98.3 degrees F (36.83 degrees C) oral Pulse rate:   69 / minute BP sitting:   152 / 81  (right arm)  Vitals Entered By: Hilda Blades Ditzler RN (February 24, 2010 2:50 PM) Is Patient Diabetic? Yes Did you bring your meter with you today? Yes Pain Assessment Patient in pain? no      Nutritional Status BMI of > 30 = obese Nutritional Status Detail appetite good CBG Result 271  Have you ever been in a relationship where you felt threatened, hurt or afraid?denies   Does patient need assistance? Functional Status Self care Ambulation Normal Comments FU - no problems.   Primary Care Provider:  Trish Fountain MD   History of Present Illness: Patient is a 65 year old man who presents today for follow up of his last appointment.  He has been doing well since his last visit.  He is here today to discuss the initiation of insulin for his diabetes.  He is currently on Glipizide for his diabetes.  He has CKD which means he is not a candidate for Metformin.  His last HgBA1C was 11.6%.  He has tried to change his diet and exercise more.  He did bring his meter today and he has tested 5 times in the last month 4 of which are in the last 2 days.  His blood sugars are consistantly above 250.  He is still hesitant to start insulin today.    Depression History:      The patient denies a depressed mood most of the day and a diminished interest in his usual daily activities.        The patient denies that he feels like life is not worth living, denies that he wishes that he were dead, and denies that he has thought about ending his life.         Preventive Screening-Counseling & Management  Alcohol-Tobacco     Alcohol drinks/day: 0     Smoking Status: quit     Year Quit: 85yrs ago     Passive Smoke Exposure:  no  Caffeine-Diet-Exercise     Does Patient Exercise: yes     Type of exercise:     WALKING     Times/week: 2  Current Medications (verified): 1)  Aspirin 81 Mg Tbec (Aspirin) .... Take 1 Tablet By Mouth Once A Day 2)  Benazepril Hcl 40 Mg Tabs (Benazepril Hcl) .... Take 2 Tablets By Mouth Once A Day 3)  Glipizide 10 Mg Tabs (Glipizide) .... Take 1and Half Tablet By Mouth Twice A Day 4)  Pravachol 40 Mg Tabs (Pravastatin Sodium) .... Take 2  Tablets By Mouth Once A Day 5)  Famotidine 20 Mg Tabs (Famotidine) .... Take 2 Tablets By Mouth Once A Day 6)  Catapres 0.2 Mg Tabs (Clonidine Hcl) .... Take 1 Tablet By Mouth Two Times A Day 7)  Furosemide 80 Mg Tabs (Furosemide) .... Take 1 and Half Pill A Day. Patrick)  Truetrack Test  Strp (Glucose Blood) .... Use To Test Blood Sugar 2-3 Times Daily 9)  Lancets 30g  Misc (Lancets) .... Use To Test Blood Glucose 2-3 Times Daily 10)  Warfarin Sodium 5 Mg Tabs (Warfarin Sodium) .... Take As  Directed. 11)  Cardizem 90 Mg Tabs (Diltiazem Hcl) .... Two Tabs Twice A Day 12)  Fish Oil 1000 Mg Caps (Omega-3 Fatty Acids) .Marland Kitchen.. 1 Capsule Twice Daily  Allergies (verified): No Known Drug Allergies  Past History:  Past medical, surgical, family and social histories (including risk factors) reviewed for relevance to current acute and chronic problems.  Past Medical History: Reviewed history from 06/09/2008 and no changes required. Hypertension:Uncontrolled                       H/o medical noncompliance                       Hospitalised*2 for HTN urgency                       w/u for refractory HTN neg incl RAS(neg MRA) Non-obstructive [id number] cath 9/05 by [facility]                                     20-30%proxRCA,30%proxLAD Diabetes mellitus, type II Hyperlipidemia H/o CVA:Rt Cerebellar hemorrhage 1/07 Chronic Kidney disease:Stage 3                                       Secondary to poorly controlled HTN                                        Baseline creatinine 2.0-2.2 but recent values 1.7.                                       Followed @[facility] ,Dr.[place] Paroxysmal Atrial Fibrillation:2D ECHO(5/07):LVef 60-70%,mild aortic root dilatation,LA mod dilated,A.fib                                                         Off anticoagulation sec to cerebellar hemorrhage H/o Bradycardia:with mild pause 1.2sec while on beta blocker 1/07.                           EP:No pacer needed.No evidence of SAN dysfunction Depression Gout:First episode 11/07;Hospitalized Mildly enlarged prostate on Renal U/S 1/07 Sleep apnea  Family History: Reviewed history from 09/19/2006 and no changes required. Brother had CA of unknown origin. Currently deceased.  Social History: Reviewed history from 06/09/2008 and no changes required. Married Quit smoking and no etoh in 32 years ago  Review of Systems  The patient denies anorexia, fever, weight loss, weight gain, vision loss, decreased hearing, hoarseness, chest pain, syncope, dyspnea on exertion, peripheral edema, prolonged cough, headaches, hemoptysis, abdominal pain, melena, hematochezia, severe indigestion/heartburn, hematuria, incontinence, genital sores, muscle weakness, suspicious skin lesions, transient blindness, difficulty walking, depression, unusual weight change, abnormal bleeding, enlarged lymph nodes, angioedema, and testicular masses.    Physical Exam  Additional Exam:  General: Well developed, [name] in no acute distress.  Vital signs reviewed.  Head: Atraumatic, normocephalic with no signs  of trauma  Eyes: PERRLA, EOM intact  Ears: TM intact  Nose: Nares patent, mucosa is pink and moist, no polyps noted  Mouth: Mucosa is pink and moist, dention,   Neck: Supple, full ROM, no thyromegaly or masses noted  Resp: Clear to ascultation bilaterally, no wheezes, rales, or rhonchi noted  CV: Regular rate and rhythm with no murmurs, rubs, or gallops noted  Abdomen: Soft,  non-tender, non-distended with normal bowel sounds  Musculoskelatal: ROM full with intact strength, no pain to palpation  Neurologic: Alert and oriented x3, Cranial nerves II-XII grossly intact, DTR normal and symmetric  Pulses: Radial, brachial, carotid, femoral, dorsal pedis, and posterior tibial pulses equal and symmetric      Impression & Recommendations:  Problem # 1:  DIABETES MELLITUS, TYPE II (ICD-250.00) We had a long discussion today about starting insulin.  Greater then 30 minutes were spent in counseling the patient on risks and benefits with long acting insulin.  After much discussion and negotiation he finally decided he would be willing to start.  He states that he wouldn't be able to start insulin until after the 1st of the month because of cost and that he would like to have Barnabas Harries, RDE show him how to administer the insulin.  We will make a referral to Butch Penny and she can see him today.  We will see him back in 2 weeks to check on his progress.  HE will check his blood sugars at least once per day.   His updated medication list for this problem includes:    Aspirin 81 Mg Tbec (Aspirin) .Marland Kitchen... Take 1 tablet by mouth once a day    Benazepril Hcl 40 Mg Tabs (Benazepril hcl) .Marland Kitchen... Take 2 tablets by mouth once a day    Glipizide 10 Mg Tabs (Glipizide) .Marland Kitchen... Take 1and half tablet by mouth twice a day    Humulin N 100 Unit/ml Susp (Insulin isophane human) ..... Inject 15 units at bedtime.  Orders: Capillary Blood Glucose/CBG [phone])  Labs Reviewed: Creat: 1.93 (02/01/2010)     Last Eye Exam: No diabetic retinopathy.   Significant arteriosclerosis Cataract.   Hypertensive retinopathy  (07/13/2008) Reviewed HgBA1c results: 11.6 (01/10/2010)  13.4 (10/11/2009)  Problem # 2:  HYPERTENSION (ICD-401.9) His blood pressure is elevated today still.  He is taking his medications.  We will increase his Clonidine to 0.3 mg two times a day today and reassess at follow up.  His  updated medication list for this problem includes:    Benazepril Hcl 40 Mg Tabs (Benazepril hcl) .Marland Kitchen... Take 2 tablets by mouth once a day    Clonidine Hcl 0.3 Mg Tabs (Clonidine hcl) .Marland Kitchen... Take 1 tablet by mouth two times a day for blood pressure    Furosemide 80 Mg Tabs (Furosemide) .Marland Kitchen... Take 1 and half pill a day.    Cardizem 90 Mg Tabs (Diltiazem hcl) .Marland Kitchen..Marland Kitchen Two tabs twice a day  BP today: 152/81 Prior BP: 155/85 (02/01/2010)  Labs Reviewed: K+: 4.0 (02/01/2010) Creat: : 1.93 (02/01/2010)   Chol: 137 (04/22/2009)   HDL: 38 (04/22/2009)   LDL: 62 (04/22/2009)   TG: 185 (04/22/2009)  Problem # 3:  HYPERLIPIDEMIA (ICD-272.4) HE is at goal and taking his medications.  HE will be due for a FLP in September.  His updated medication list for this problem includes:    Pravachol 40 Mg Tabs (Pravastatin sodium) .Marland Kitchen... Take 2  tablets by mouth once a day  Labs Reviewed: SGOT: 27 (10/11/2009)  SGPT: 34 (10/11/2009)   HDL:38 (04/22/2009), 39 (06/09/2008)  LDL:62 (04/22/2009), 87 (06/09/2008)  Chol:137 (04/22/2009), 164 (06/09/2008)  Trig:185 (04/22/2009), 192 (06/09/2008)  Problem # 4:  [place] (ICD-V70.0) He will be due for Tdap, pneumovax, and a foot exam in September as well.    Complete Medication List: 1)  Aspirin 81 Mg Tbec (Aspirin) .... Take 1 tablet by mouth once a day 2)  Benazepril Hcl 40 Mg Tabs (Benazepril hcl) .... Take 2 tablets by mouth once a day 3)  Glipizide 10 Mg Tabs (Glipizide) .... Take 1and half tablet by mouth twice a day 4)  Pravachol 40 Mg Tabs (Pravastatin sodium) .... Take 2  tablets by mouth once a day 5)  Famotidine 20 Mg Tabs (Famotidine) .... Take 2 tablets by mouth once a day 6)  Clonidine Hcl 0.3 Mg Tabs (Clonidine hcl) .... Take 1 tablet by mouth two times a day for blood pressure 7)  Furosemide 80 Mg Tabs (Furosemide) .... Take 1 and half pill a day. Patrick)  Truetrack Test Strp (Glucose blood) .... Use to test blood sugar 2-3 times daily 9)   Lancets 30g Misc (Lancets) .... Use to test blood glucose 2-3 times daily 10)  Warfarin Sodium 5 Mg Tabs (Warfarin sodium) .... Take as directed. 11)  Cardizem 90 Mg Tabs (Diltiazem hcl) .... Two tabs twice a day 12)  Fish Oil 1000 Mg Caps (Omega-3 fatty acids) .Marland Kitchen.. 1 capsule twice daily 13)  Humulin N 100 Unit/ml Susp (Insulin isophane human) .... Inject 15 units at bedtime. 14)  Elite-thin Insulin Syringe 31g X 5/16" 0.5 Ml Misc (Insulin syringe-needle u-100) .... Use to inject insulin daily (250)  Patient Instructions: 1)  Start the Lantus 15 units at bedtime.  2)  Start the Clonidine 0.3 mg two times a day for your blood pressure 3)  Continue to check your blood sugar at least once per day.  4)  Please schedule a follow-up appointment in 2 weeks. Prescriptions: ELITE-THIN INSULIN SYRINGE 31G X 5/16" 0.5 ML MISC (INSULIN SYRINGE-NEEDLE U-100) Use to inject insulin daily (250)  #100 x 7   Entered and Authorized by:   Trish Fountain MD   Signed by:   Trish Fountain MD on 02/24/2010   Method used:   Electronically to        [facility] (retail)       [phone]       [place], [place]  [address]       Ph: [phone]       Fax: [phone]   RxID:   [id number] HUMULIN N 100 UNIT/ML SUSP (INSULIN ISOPHANE HUMAN) Inject 15 units at bedtime.  #1 x 3   Entered and Authorized by:   Trish Fountain MD   Signed by:   Trish Fountain MD on 02/24/2010   Method used:   Electronically to        [facility].* (retail)       [phone]       [place], [place]  [address]       Ph: [phone]       Fax: [phone]   RxID:   [id number] CLONIDINE HCL 0.3 MG TABS (CLONIDINE HCL) Take 1 tablet by mouth two times a day for blood pressure  #60 x 3   Entered and Authorized by:   Trish Fountain MD   Signed  by:   Trish Fountain MD on 02/24/2010   Method used:    Electronically to        [facility] (retail)       [phone]       [place], [place]  [address]       Ph: [phone]       Fax: [phone]   RxID:   [id number] LANTUS SOLOSTAR 100 UNIT/ML SOLN (INSULIN GLARGINE) Inject 15 units at bedtime daily  #1 x 3   Entered and Authorized by:   Trish Fountain MD   Signed by:   Trish Fountain MD on 02/24/2010   Method used:   Electronically to        [facility] (retail)       [phone]       [place], [place]  [address]       Ph: [phone]       Fax: [phone]   RxID:   [id number]   Prevention & Chronic Care Immunizations   Influenza vaccine: Fluvax 3+  (05/26/2009)   Influenza vaccine deferral: Not available  (02/01/2010)   Influenza vaccine due: 03/30/2010    Tetanus booster: Not documented   Td booster deferral: Deferred  (01/10/2010)    Pneumococcal vaccine: Pneumovax (Medicare)  (05/26/2009)    H. zoster vaccine: Not documented   H. zoster vaccine deferral: Deferred  (01/10/2010)  Colorectal Screening   Hemoccult: Not documented   Hemoccult action/deferral: Deferred  (01/10/2010)    Colonoscopy: Benign polyps.   (12/04/2007)   Colonoscopy due: 12/03/2012  Other Screening   PSA: Not documented   PSA action/deferral: Discussion deferred  (01/10/2010)   Smoking status: quit  (02/24/2010)  Diabetes Mellitus   HgbA1C: 11.6  (01/10/2010)   Hemoglobin A1C due: 12/12/2008    Eye exam: No diabetic retinopathy.   Significant arteriosclerosis Cataract.   Hypertensive retinopathy   (07/13/2008)   Eye exam due: 12/2008    Foot exam: yes  (04/06/2009)   Foot exam action/deferral: Do today   High risk foot: No  (12/30/2006)   Foot care education: Done  (04/06/2009)   Foot exam due: 12/30/2007    Urine microalbumin/creatinine ratio: 627.0  (12/20/2008)   Urine microalbumin/cr due: 06/09/2009     Diabetes flowsheet reviewed?: Yes   Progress toward A1C goal: Unchanged  Lipids   Total Cholesterol: 137  (04/22/2009)   LDL: 62  (04/22/2009)   LDL Direct: Not documented   HDL: 38  (04/22/2009)   Triglycerides: 185  (04/22/2009)    SGOT (AST): 27  (10/11/2009)   SGPT (ALT): 34  (10/11/2009)   Alkaline phosphatase: 96  (10/11/2009)   Total bilirubin: 0.7  (10/11/2009)    Lipid flowsheet reviewed?: Yes   Progress toward LDL goal: At goal  Hypertension   Last Blood Pressure: 152 / 81  (02/24/2010)   Serum creatinine: 1.93  (02/01/2010)   BMP action: Ordered   Serum potassium 4.0  (02/01/2010)    Hypertension flowsheet reviewed?: Yes   Progress toward BP goal: Unchanged  Self-Management Support :   Personal Goals (by the next clinic visit) :     Personal A1C goal: 7  (06/27/2009)     Personal blood pressure goal: 130/80  (06/27/2009)     Personal LDL goal: 70  (06/27/2009)    Patient will work on the following items until the next clinic  visit to reach self-care goals:     Medications and monitoring: take my medicines every day, check my blood sugar, check my blood pressure, bring all of my medications to every visit, examine my feet every day  (02/24/2010)     Eating: drink diet soda or water instead of juice or soda, eat more vegetables, use fresh or frozen vegetables, eat foods that are low in salt, eat baked foods instead of fried foods, eat fruit for snacks and desserts, limit or avoid alcohol  (02/24/2010)     Activity: take a 30 minute walk every day  (02/24/2010)    Diabetes self-management support: Written self-care plan, Education handout, Resources for patients handout  (02/24/2010)   Diabetes care plan printed   Diabetes education handout printed   Last diabetes self-management training by diabetes educator: 07/19/2008   Last medical nutrition therapy: 10/12/2008    Hypertension self-management support: Written self-care plan, Education handout, Resources for  patients handout  (02/24/2010)   Hypertension self-care plan printed.   Hypertension education handout printed    Lipid self-management support: Written self-care plan, Education handout, Resources for patients handout  (02/24/2010)   Lipid self-care plan printed.   Lipid education handout printed      Resource handout printed.   Appended Document: EST-3 WEEK RECHECK/CH Please bill as an established patient level IV

## 2010-08-29 NOTE — Consult Note (Signed)
Summary: [facility]  [facility]   Imported By: Lacy Duverney 04/24/2010 15:47:27  _____________________________________________________________________  External Attachment:    Type:   Image     Comment:   External Document  Appended Document: [facility]   Diabetic Eye Exam  Procedure date:  04/10/2010  Findings:      No diabetic retinopathy.     Procedures Next Due Date:    Diabetic Eye Exam: 03/2011   Diabetic Eye Exam  Procedure date:  04/10/2010  Findings:      No diabetic retinopathy.     Procedures Next Due Date:    Diabetic Eye Exam: 03/2011

## 2010-08-29 NOTE — Assessment & Plan Note (Signed)
Summary: COU/VS  Anticoagulant Therapy Managed by: [name]. [name]  PharmD CACP Referring MD: Dawna Part MD PCP: Dawna Part MD [facility] Attending: Verneda Skill MD Indication 1: Atrial fibrillation Indication 2: Aftercare long term use Anticoagulants V58.61,V58.83 Start date: 10/09/2006 Duration: Indefinite  Patient Assessment Reviewed by: [name] PharmD  November 14, 2009 Medication review: verified warfarin dosage & schedule,verified previous prescription medications, verified doses & any changes, verified new medications, reviewed OTC medications, reviewed OTC health products-vitamins supplements etc Complications: none Dietary changes: none   Health status changes: none   Lifestyle changes: none   Recent/future hospitalizations: none   Recent/future procedures: none   Recent/future dental: none Patient Assessment Part 2:  Have you MISSED ANY DOSES or CHANGED TABLETS?  No missed Warfarin doses or changed tablets.  Have you had any BRUISING or BLEEDING ( nose or gum bleeds,blood in urine or stool)?  No reported bruising or bleeding in nose, gums, urine, stool.  Have you STARTED or STOPPED any MEDICATIONS, including OTC meds,herbals or supplements?  No other medications or herbal supplements were started or stopped.  Have you CHANGED your DIET, especially green vegetables,or ALCOHOL intake?  No changes in diet or alcohol intake.  Have you had any ILLNESSES or HOSPITALIZATIONS?  No reported illnesses or hospitalizations  Have you had any signs of CLOTTING?(chest discomfort,dizziness,shortness of breath,arms tingling,slurred speech,swelling or redness in leg)    No chest discomfort, dizziness, shortness of breath, tingling in arm, slurred speech, swelling, or redness in leg.     Treatment  Target INR: 2.0-3.0 INR: 2.6  Date: 11/14/2009 Regimen In:  32.5mg /week INR reflects regimen in: 2.6  New  Tablet strength: : 5mg  Regimen Out:     Sunday: 1 Tablet  Monday: 1 Tablet     Tuesday: 1 Tablet     Wednesday: 1/2 Tablet     Thursday: 1 Tablet      Friday: 1 Tablet     Saturday: 1 Tablet Total Weekly: 32.5mg /week mg  Next INR Due: 12/12/2009 Adjusted by: [name]. Elie Confer III PharmD CACP   Return to anticoagulation clinic:  12/12/2009 Time of next visit: 1430    Allergies: No Known Drug Allergies

## 2010-08-29 NOTE — Assessment & Plan Note (Signed)
Summary: EST-R/S FOR ROUTINE CHECKUP/CH   Vital Signs:  Patient profile:   65 year old male Height:      68 inches (172.72 cm) Weight:      218.0 pounds (99.09 kg) Temp:     97.2 degrees F oral Pulse rate:   55 / minute BP sitting:   148 / 83  (right arm)  Vitals Entered By: Morrison Old RN (January 10, 2010 9:38 AM) CC: F/U visit., Depression Is Patient Diabetic? Yes Did you bring your meter with you today? Yes Pain Assessment Patient in pain? no       Have you ever been in a relationship where you felt threatened, hurt or afraid?No   Does patient need assistance? Functional Status Self care Ambulation Normal   Primary Care Provider:  Dawna Part MD  CC:  F/U visit. and Depression.  History of Present Illness: Patrick [name] is a 65 year old Male with PMH/problems as outlined in the EMR, who presents to the [facility] with chief complaint(s) of:    1. HTN: doing his meds as prescribed, but he is missing pills every now and then. 2. DM: we discussed about starting insulin again. He says he is not quite prepared for that and would like to think about it. Wants to focus on diet and lifestyle.  3. A fib on coumadin: being managed by Dr. Elie Confer.   No new complaints today. Needs some refills.      Depression History:      The patient denies a depressed mood most of the day and a diminished interest in his usual daily activities.         Preventive Screening-Counseling & Management  Alcohol-Tobacco     Alcohol drinks/day: 0     Smoking Status: quit     Year Quit: 1977     Passive Smoke Exposure: no  Caffeine-Diet-Exercise     Does Patient Exercise: yes     Type of exercise:     WALKING     Times/week: 2  Current Medications (verified): 1)  Aspirin 81 Mg Tbec (Aspirin) .... Take 1 Tablet By Mouth Once A Day 2)  Benazepril Hcl 40 Mg Tabs (Benazepril Hcl) .... Take 2 Tablets By Mouth Once A Day 3)  Glipizide 10 Mg Tabs (Glipizide) .... Take 1and Half Tablet By Mouth Twice  A Day 4)  Pravachol 40 Mg Tabs (Pravastatin Sodium) .... Take 2  Tablets By Mouth Once A Day 5)  Famotidine 20 Mg Tabs (Famotidine) .... Take 2 Tablets By Mouth Once A Day 6)  Catapres 0.2 Mg Tabs (Clonidine Hcl) .... Take 1 Tablet By Mouth Two Times A Day 7)  Furosemide 80 Mg Tabs (Furosemide) .... Take 1 and Half Pill A Day. 8)  Truetrack Test  Strp (Glucose Blood) .... Use To Test Blood Sugar 2-3 Times Daily 9)  Lancets 30g  Misc (Lancets) .... Use To Test Blood Glucose 2-3 Times Daily 10)  Warfarin Sodium 5 Mg Tabs (Warfarin Sodium) .... Take As Directed. 11)  Cardizem 90 Mg Tabs (Diltiazem Hcl) .... Two Tabs Twice A Day 12)  Lantus 100 Unit/ml Soln (Insulin Glargine) .Marland Kitchen.. 15 Units Subcutanous Ly Daily 13)  Fish Oil 1000 Mg Caps (Omega-3 Fatty Acids) .Marland Kitchen.. 1 Capsule Twice Daily 14)  Prednisone 20 Mg Tabs (Prednisone) .... Take Two Tablets For 4 Days, Then One Tablet For 6 Days For Gout  Allergies (verified): No Known Drug Allergies  Past History:  Past Medical History: Last updated: 06/09/2008 Hypertension:Uncontrolled  H/o medical noncompliance                       Hospitalised*2 for HTN urgency                       w/u for refractory HTN neg incl RAS(neg MRA) Non-obstructive [id number] cath 9/05 by [facility]                                     20-30%proxRCA,30%proxLAD Diabetes mellitus, type II Hyperlipidemia H/o CVA:Rt Cerebellar hemorrhage 1/07 Chronic Kidney disease:Stage 3                                       Secondary to poorly controlled HTN                                       Baseline creatinine 2.0-2.2 but recent values 1.7.                                       Followed @[facility] ,Dr.Goldsborough Paroxysmal Atrial Fibrillation:2D ECHO(5/07):LVef 60-70%,mild aortic root dilatation,LA mod dilated,A.fib                                                         Off anticoagulation sec to cerebellar hemorrhage H/o Bradycardia:with mild  pause 1.2sec while on beta blocker 1/07.                           EP:No pacer needed.No evidence of SAN dysfunction Depression Gout:First episode 11/07;Hospitalized Mildly enlarged prostate on Renal U/S 1/07 Sleep apnea  Family History: Last updated: 09/19/2006 Brother had CA of unknown origin. Currently deceased.  Social History: Last updated: 06/09/2008 Married Quit smoking and no etoh in 32 years ago  Risk Factors: Alcohol Use: 0 (01/10/2010) Exercise: yes (01/10/2010)  Risk Factors: Smoking Status: quit (01/10/2010) Passive Smoke Exposure: no (01/10/2010)  Review of Systems      See HPI  Physical Exam  General:  alert and well-developed.   Lungs:  normal respiratory effort and normal breath sounds.   Heart:  normal rate and no murmur.   Abdomen:  soft and non-tender.   Pulses:  normal peripheral pulses  Extremities:  no cyanosis, clubbing or edema  Neurologic:  non focal.  Psych:  Oriented X3 and normally interactive.     Impression & Recommendations:  Problem # 1:  DIABETES MELLITUS, TYPE II (ICD-250.00) We discussed at length about starting insulin to bring his A1c/ blood sugar under control. Pt says he will need sometime to think about it and would like to come back and discuss about it. I will have him come see Ms. Riley for help with initiation of insulin. We also made a referral for eye exam today.  His updated medication list for this problem includes:    Aspirin 81 Mg Tbec (Aspirin) .Marland Kitchen... Take 1 tablet by mouth once  a day    Benazepril Hcl 40 Mg Tabs (Benazepril hcl) .Marland Kitchen... Take 2 tablets by mouth once a day    Glipizide 10 Mg Tabs (Glipizide) .Marland Kitchen... Take 1and half tablet by mouth twice a day    Lantus 100 Unit/ml Soln (Insulin glargine) .Marland KitchenMarland KitchenMarland KitchenMarland Kitchen 15 units subcutanous ly daily  Orders: T- Capillary Blood Glucose ([id number]) T-Hgb A1C (in-house) [id number]) Ophthalmology Referral (Ophthalmology)  Problem # 2:  HYPERTENSION (ICD-401.9) 148/83 today. We also  talked about doing meds without fail.   His updated medication list for this problem includes:    Benazepril Hcl 40 Mg Tabs (Benazepril hcl) .Marland Kitchen... Take 2 tablets by mouth once a day    Catapres 0.2 Mg Tabs (Clonidine hcl) .Marland Kitchen... Take 1 tablet by mouth two times a day    Furosemide 80 Mg Tabs (Furosemide) .Marland Kitchen... Take 1 and half pill a day.    Cardizem 90 Mg Tabs (Diltiazem hcl) .Marland Kitchen..Marland Kitchen Two tabs twice a day  Orders: T-Basic Metabolic Panel ([id number]) Ophthalmology Referral (Ophthalmology)  Problem # 3:  ATRIAL FIBRILLATION, PAROXYSMAL, CHRONIC (ICD-427.31) Rate controlled, continue coumadin.   Problem # 4:  RENAL DISEASE, CHRONIC, STAGE II (ICD-585.2) Check renal fx today.   Complete Medication List: 1)  Aspirin 81 Mg Tbec (Aspirin) .... Take 1 tablet by mouth once a day 2)  Benazepril Hcl 40 Mg Tabs (Benazepril hcl) .... Take 2 tablets by mouth once a day 3)  Glipizide 10 Mg Tabs (Glipizide) .... Take 1and half tablet by mouth twice a day 4)  Pravachol 40 Mg Tabs (Pravastatin sodium) .... Take 2  tablets by mouth once a day 5)  Famotidine 20 Mg Tabs (Famotidine) .... Take 2 tablets by mouth once a day 6)  Catapres 0.2 Mg Tabs (Clonidine hcl) .... Take 1 tablet by mouth two times a day 7)  Furosemide 80 Mg Tabs (Furosemide) .... Take 1 and half pill a day. 8)  Truetrack Test Strp (Glucose blood) .... Use to test blood sugar 2-3 times daily 9)  Lancets 30g Misc (Lancets) .... Use to test blood glucose 2-3 times daily 10)  Warfarin Sodium 5 Mg Tabs (Warfarin sodium) .... Take as directed. 11)  Cardizem 90 Mg Tabs (Diltiazem hcl) .... Two tabs twice a day 12)  Lantus 100 Unit/ml Soln (Insulin glargine) .Marland Kitchen.. 15 units subcutanous ly daily 13)  Fish Oil 1000 Mg Caps (Omega-3 fatty acids) .Marland Kitchen.. 1 capsule twice daily 14)  Prednisone 20 Mg Tabs (Prednisone) .... Take two tablets for 4 days, then one tablet for 6 days for gout  Patient Instructions: 1)  Please schedule an appointment to see Ms.  Barnabas Harries for help with diabetes and nutrition education. Please bring in your glucose meter with you when you come to see her.  2)  Please schedule a follow-up appointment in 1 month. 3)  We will let you know if anything wrong with your lab work.   Prescriptions: GLIPIZIDE 10 MG TABS (GLIPIZIDE) Take 1and half tablet by mouth twice a day  #90 x 11   Entered and Authorized by:   Dawna Part MD   Signed by:   Dawna Part MD on 01/10/2010   Method used:   Electronically to        [facility] (retail)       [phone]       [place], [place]  [address]       Ph: [phone]  Fax: [phone]   RxIDBL:[id number]   Prevention & Chronic Care Immunizations   Influenza vaccine: Fluvax 3+  (05/26/2009)   Influenza vaccine due: 03/30/2010    Tetanus booster: Not documented   Td booster deferral: Deferred  (01/10/2010)    Pneumococcal vaccine: Pneumovax (Medicare)  (05/26/2009)    H. zoster vaccine: Not documented   H. zoster vaccine deferral: Deferred  (01/10/2010)  Colorectal Screening   Hemoccult: Not documented   Hemoccult action/deferral: Deferred  (01/10/2010)    Colonoscopy: Benign polyps.   (12/04/2007)   Colonoscopy due: 11/2012  Other Screening   PSA: Not documented   PSA action/deferral: Discussion deferred  (01/10/2010)   Smoking status: quit  (01/10/2010)  Diabetes Mellitus   HgbA1C: 11.6  (01/10/2010)   Hemoglobin A1C due: 12/12/2008    Eye exam: No diabetic retinopathy.   Significant arteriosclerosis Cataract.   Hypertensive retinopathy   (07/13/2008)   Eye exam due: 12/2008    Foot exam: yes  (04/06/2009)   Foot exam action/deferral: Do today   High risk foot: No  (12/30/2006)   Foot care education: Done  (04/06/2009)   Foot exam due: 12/30/2007    Urine microalbumin/creatinine ratio: 627.0  (12/20/2008)   Urine microalbumin/cr due: 06/09/2009    Diabetes flowsheet reviewed?: Yes    Progress toward A1C goal: Unchanged  Lipids   Total Cholesterol: 137  (04/22/2009)   LDL: 62  (04/22/2009)   LDL Direct: Not documented   HDL: 38  (04/22/2009)   Triglycerides: 185  (04/22/2009)    SGOT (AST): 27  (10/11/2009)   SGPT (ALT): 34  (10/11/2009)   Alkaline phosphatase: 96  (10/11/2009)   Total bilirubin: 0.7  (10/11/2009)    Lipid flowsheet reviewed?: Yes   Progress toward LDL goal: At goal  Hypertension   Last Blood Pressure: 148 / 83  (01/10/2010)   Serum creatinine: 2.00  (10/11/2009)   BMP action: Ordered   Serum potassium 3.9  (10/11/2009)    Hypertension flowsheet reviewed?: Yes   Progress toward BP goal: Improved  Self-Management Support :   Personal Goals (by the next clinic visit) :     Personal A1C goal: 7  (06/27/2009)     Personal blood pressure goal: 130/80  (06/27/2009)     Personal LDL goal: 70  (06/27/2009)    Patient will work on the following items until the next clinic visit to reach self-care goals:     Medications and monitoring: bring all of my medications to every visit, examine my feet every day  (01/10/2010)     Eating: drink diet soda or water instead of juice or soda, eat more vegetables, use fresh or frozen vegetables, eat foods that are low in salt, eat baked foods instead of fried foods, eat fruit for snacks and desserts  (01/10/2010)     Activity: take a 30 minute walk every day  (01/10/2010)    Diabetes self-management support: CBG self-monitoring log, Education handout, Written self-care plan  (01/10/2010)   Diabetes care plan printed   Diabetes education handout printed   Last diabetes self-management training by diabetes educator: 07/19/2008   Last medical nutrition therapy: 10/12/2008    Hypertension self-management support: CBG self-monitoring log, Education handout, Written self-care plan  (01/10/2010)   Hypertension self-care plan printed.   Hypertension education handout printed    Lipid self-management support: CBG  self-monitoring log, Education handout, Written self-care plan  (01/10/2010)   Lipid self-care plan printed.   Lipid education handout printed  Process Orders Check Orders Results:     [facility]: Check successful Tests Sent for requisitioning (January 10, 2010 10:41 AM):     01/10/2010: [facility] -- T-Basic Metabolic Panel 0000000 (signed)    Laboratory Results   Blood Tests   Date/Time Received: January 10, 2010 9:55 AM Date/Time Reported: Maryan Rued  January 10, 2010 9:55 AM    HGBA1C: 11.6%   (Normal Range: Non-Diabetic - 3-6%   Control Diabetic - 6-8%) CBG Fasting:: 317mg /dL

## 2010-08-29 NOTE — Assessment & Plan Note (Signed)
Summary: est-overdue for 2 week recheck/ch   Vital Signs:  Patient profile:   65 year old male Height:      68 inches (172.72 cm) Weight:      219.5 pounds (99.77 kg) BMI:     33.50 Temp:     97.4 degrees F (36.33 degrees C) oral Pulse rate:   54 / minute BP sitting:   138 / 79  (right arm)  Vitals Entered By: Hilda Blades Ditzler RN (March 17, 2010 3:10 PM) Is Patient Diabetic? Yes Did you bring your meter with you today? Yes Pain Assessment Patient in pain? no      Nutritional Status BMI of > 30 = obese Nutritional Status Detail appetite good CBG Result 178  Have you ever been in a relationship where you felt threatened, hurt or afraid?denies   Does patient need assistance? Functional Status Self care Ambulation Normal Comments FU - doing well.   Primary Care Provider:  Trish Fountain MD   History of Present Illness: Patient is a 65 year old man who presents today for a follow up appointment after starting insulin.  He received his insulin and started it on 03/07/10.  He was started on 15 units of Humulin N at bedtime.  He has had one episode of his blood sugar being 84 in the morning before breakfast but otherwise has not had any lower blood sugar readings.  He did bring in his meter today which we downloaded and reviewed.  His blood sugars have been about 100-150 points lower then they were before starting insulin.  He reports no problems taking the medicine.  He continues to take his other medications.  He has not had any symptoms of hypoglycemia including diaphoresis, nausea, vomiting, or shaking.  We discussed the warning signs of hypoglycemia and strategies to deal with them if they come up.    Of note his blood pressure, while still a bit above goal today, has improved from his last appointment with the increase in his clonidine to 0.3 mg.    Depression History:      The patient denies a depressed mood most of the day and a diminished interest in his usual daily  activities.        The patient denies that he feels like life is not worth living, denies that he wishes that he were dead, and denies that he has thought about ending his life.         Preventive Screening-Counseling & Management  Alcohol-Tobacco     Alcohol drinks/day: 0     Smoking Status: quit     Year Quit: 36yrs ago     Passive Smoke Exposure: no  Caffeine-Diet-Exercise     Does Patient Exercise: yes     Type of exercise:     WALKING     Times/week: 2  Current Medications (verified): 1)  Aspirin 81 Mg Tbec (Aspirin) .... Take 1 Tablet By Mouth Once A Day 2)  Benazepril Hcl 40 Mg Tabs (Benazepril Hcl) .... Take 2 Tablets By Mouth Once A Day 3)  Glipizide 10 Mg Tabs (Glipizide) .... Take 1and Half Tablet By Mouth Twice A Day 4)  Pravachol 40 Mg Tabs (Pravastatin Sodium) .... Take 2  Tablets By Mouth Once A Day 5)  Famotidine 20 Mg Tabs (Famotidine) .... Take 2 Tablets By Mouth Once A Day 6)  Clonidine Hcl 0.3 Mg Tabs (Clonidine Hcl) .... Take 1 Tablet By Mouth Two Times A Day For Blood Pressure 7)  Furosemide 80 Mg Tabs (Furosemide) .... Take 1 and Half Pill A Day. 8)  Truetrack Test  Strp (Glucose Blood) .... Use To Test Blood Sugar 2-3 Times Daily 9)  Lancets 30g  Misc (Lancets) .... Use To Test Blood Glucose 2-3 Times Daily 10)  Warfarin Sodium 5 Mg Tabs (Warfarin Sodium) .... Take As Directed. 11)  Cardizem 90 Mg Tabs (Diltiazem Hcl) .... Two Tabs Twice A Day 12)  Fish Oil 1000 Mg Caps (Omega-3 Fatty Acids) .Marland Kitchen.. 1 Capsule Twice Daily 13)  Humulin N 100 Unit/ml Susp (Insulin Isophane Human) .... Inject 15 Units At Bedtime. 14)  Elite-Thin Insulin Syringe 31g X 5/16" 0.5 Ml Misc (Insulin Syringe-Needle U-100) .... Use To Inject Insulin Daily (250) 15)  Relion Insulin Syringe 31g X 5/16" 0.5 Ml Misc (Insulin Syringe-Needle U-100) .... Use To Inject Relion Humulin Insulin Once Time A Day  (250.00)  Allergies (verified): No Known Drug Allergies  Past History:  Past medical,  surgical, family and social histories (including risk factors) reviewed for relevance to current acute and chronic problems.  Past Medical History: Reviewed history from 06/09/2008 and no changes required. Hypertension:Uncontrolled                       H/o medical noncompliance                       Hospitalised*2 for HTN urgency                       w/u for refractory HTN neg incl RAS(neg MRA) Non-obstructive [id number] cath 9/05 by [facility]                                     20-30%proxRCA,30%proxLAD Diabetes mellitus, type II Hyperlipidemia H/o CVA:Rt Cerebellar hemorrhage 1/07 Chronic Kidney disease:Stage 3                                       Secondary to poorly controlled HTN                                       Baseline creatinine 2.0-2.2 but recent values 1.7.                                       Followed @[facility] ,Dr.Goldsborough Paroxysmal Atrial Fibrillation:2D ECHO(5/07):LVef 60-70%,mild aortic root dilatation,LA mod dilated,A.fib                                                         Off anticoagulation sec to cerebellar hemorrhage H/o Bradycardia:with mild pause 1.2sec while on beta blocker 1/07.                           EP:No pacer needed.No evidence of SAN dysfunction Depression Gout:First episode 11/07;Hospitalized Mildly enlarged prostate on Renal U/S 1/07 Sleep apnea  Family History: Reviewed history from 09/19/2006 and no  changes required. Brother had CA of unknown origin. Currently deceased.  Social History: Reviewed history from 06/09/2008 and no changes required. Married Quit smoking and no etoh in 32 years ago  Review of Systems  The patient denies anorexia, fever, weight loss, weight gain, vision loss, decreased hearing, hoarseness, chest pain, syncope, dyspnea on exertion, peripheral edema, prolonged cough, headaches, hemoptysis, abdominal pain, melena, hematochezia, severe indigestion/heartburn, hematuria, incontinence, genital  sores, muscle weakness, suspicious skin lesions, transient blindness, difficulty walking, depression, unusual weight change, abnormal bleeding, enlarged lymph nodes, angioedema, and testicular masses.    Physical Exam  Additional Exam:  General: Well developed, male in no acute distress.  Vital signs reviewed.  Head: Atraumatic, normocephalic with no signs of trauma  Eyes: PERRLA, EOM intact  Ears: TM intact  Nose: Nares patent, mucosa is pink and moist, no polyps noted  Mouth: Mucosa is pink and moist, good dention,   Neck: Supple, full ROM, no thyromegaly or masses noted  Resp: Clear to ascultation bilaterally, no wheezes, rales, or rhonchi noted  CV: Regular rate and rhythm with no murmurs, rubs, or gallops noted  Abdomen: Soft, non-tender, non-distended with normal bowel sounds  Musculoskelatal: ROM full with intact strength, no pain to palpation  Neurologic: Alert and oriented x3, Cranial nerves II-XII grossly intact, DTR normal and symmetric  Pulses: Radial, brachial, carotid, femoral, dorsal pedis, and posterior tibial pulses equal and symmetric     Impression & Recommendations:  Problem # 1:  DIABETES MELLITUS, TYPE II (ICD-250.00) He has started taking his insulin which has made an improvement in his CBGs.  We will see if he can continue this success and if that translates into lower HgBA1C in 3 months.  We will check his microalbumin/creatinine level today since it has been a year since that was checked.  He is encouraged to continue trying to work on diet and exercise and taking his medications.  We will get an opthomology appointment for his year eye exam set up.  His updated medication list for this problem includes:    Aspirin 81 Mg Tbec (Aspirin) .Marland Kitchen... Take 1 tablet by mouth once a day    Benazepril Hcl 40 Mg Tabs (Benazepril hcl) .Marland Kitchen... Take 2 tablets by mouth once a day    Glipizide 10 Mg Tabs (Glipizide) .Marland Kitchen... Take 1and half tablet by mouth twice a day    Humulin N 100  Unit/ml Susp (Insulin isophane human) ..... Inject 15 units at bedtime.  Orders: Capillary Blood Glucose/CBG [phone]) Ophthalmology Referral (Ophthalmology) T-Urine Microalbumin w/creat. ratio [phone])  Labs Reviewed: Creat: 1.93 (02/01/2010)     Last Eye Exam: No diabetic retinopathy.   Significant arteriosclerosis Cataract.   Hypertensive retinopathy  (07/13/2008) Reviewed HgBA1c results: 11.6 (01/10/2010)  13.4 (10/11/2009)  Problem # 2:  HYPERTENSION (ICD-401.9)  Blood pressure is better today. He is still above goal but at this time there are no easy fixes for his blood pressure.  Possible interventions at his follow up would be the consideration of a sleep study to assess his sleep apnea since this can increase blood pressure.  It is possible by dealing with this we could bring his blood pressure into better control.  His updated medication list for this problem includes:    Benazepril Hcl 40 Mg Tabs (Benazepril hcl) .Marland Kitchen... Take 2 tablets by mouth once a day    Clonidine Hcl 0.3 Mg Tabs (Clonidine hcl) .Marland Kitchen... Take 1 tablet by mouth two times a day for blood pressure    Furosemide  80 Mg Tabs (Furosemide) .Marland Kitchen... Take 1 and half pill a day.    Cardizem 90 Mg Tabs (Diltiazem hcl) .Marland Kitchen..Marland Kitchen Two tabs twice a day  BP today: 138/79 Prior BP: 152/81 (02/24/2010)  Labs Reviewed: K+: 4.0 (02/01/2010) Creat: : 1.93 (02/01/2010)   Chol: 137 (04/22/2009)   HDL: 38 (04/22/2009)   LDL: 62 (04/22/2009)   TG: 185 (04/22/2009)  Problem # 3:  HYPERLIPIDEMIA (ICD-272.4) He will be due for his fasting lipid panel at his next follow up appointment.   His updated medication list for this problem includes:    Pravachol 40 Mg Tabs (Pravastatin sodium) .Marland Kitchen... Take 2  tablets by mouth once a day  Labs Reviewed: SGOT: 27 (10/11/2009)   SGPT: 34 (10/11/2009)   HDL:38 (04/22/2009), 39 (06/09/2008)  LDL:62 (04/22/2009), 87 (06/09/2008)  Chol:137 (04/22/2009), 164 (06/09/2008)  Trig:185  (04/22/2009), 192 (06/09/2008)  Problem # 4:  ATRIAL FIBRILLATION, PAROXYSMAL, CHRONIC (ICD-427.31) He continues on warfarin therapy and has his next appointment on 8/29 with Dr. Orma Flaming.  He is theraputic at this time and will need to continue on Warfarin indefinately because of his paroxysmal A. fib. His updated medication list for this problem includes:    Aspirin 81 Mg Tbec (Aspirin) .Marland Kitchen... Take 1 tablet by mouth once a day    Warfarin Sodium 5 Mg Tabs (Warfarin sodium) .Marland Kitchen... Take as directed.    Cardizem 90 Mg Tabs (Diltiazem hcl) .Marland Kitchen..Marland Kitchen Two tabs twice a day  Reviewed the following: PT: 16.8 (09/14/2008)   INR: 2.4 (02/27/2010) Coumadin Dose (weekly): 32.5mg /week (02/27/2010) Prior Coumadin Dose (weekly): 32.5mg /week (02/27/2010) Next Protime: 03/27/2010 (dated on 02/27/2010)  Problem # 5:  [place] (ICD-V70.0) At his next appointment we will need to discuss his flu shot, tetanus, and possible zoster vaccine.  Complete Medication List: 1)  Aspirin 81 Mg Tbec (Aspirin) .... Take 1 tablet by mouth once a day 2)  Benazepril Hcl 40 Mg Tabs (Benazepril hcl) .... Take 2 tablets by mouth once a day 3)  Glipizide 10 Mg Tabs (Glipizide) .... Take 1and half tablet by mouth twice a day 4)  Pravachol 40 Mg Tabs (Pravastatin sodium) .... Take 2  tablets by mouth once a day 5)  Famotidine 20 Mg Tabs (Famotidine) .... Take 2 tablets by mouth once a day 6)  Clonidine Hcl 0.3 Mg Tabs (Clonidine hcl) .... Take 1 tablet by mouth two times a day for blood pressure 7)  Furosemide 80 Mg Tabs (Furosemide) .... Take 1 and half pill a day. 8)  Truetrack Test Strp (Glucose blood) .... Use to test blood sugar 2-3 times daily 9)  Lancets 30g Misc (Lancets) .... Use to test blood glucose 2-3 times daily 10)  Warfarin Sodium 5 Mg Tabs (Warfarin sodium) .... Take as directed. 11)  Cardizem 90 Mg Tabs (Diltiazem hcl) .... Two tabs twice a day 12)  Fish Oil 1000 Mg Caps (Omega-3 fatty acids) .Marland Kitchen.. 1  capsule twice daily 13)  Humulin N 100 Unit/ml Susp (Insulin isophane human) .... Inject 15 units at bedtime. 14)  Elite-thin Insulin Syringe 31g X 5/16" 0.5 Ml Misc (Insulin syringe-needle u-100) .... Use to inject insulin daily (250) 15)  Relion Insulin Syringe 31g X 5/16" 0.5 Ml Misc (Insulin syringe-needle u-100) .... Use to inject relion humulin insulin once time a day  (250.00)  Patient Instructions: 1)  Continue taking your insulin as directed and testing your blood sugars.   2)  Keep working on diet and exercise!  We are making steps in  the right direction! 3)  Please schedule a follow-up appointment in 3 months. Process Orders Check Orders Results:     [facility]: Check successful Tests Sent for requisitioning (March 19, 2010 9:37 PM):     03/17/2010: [facility] -- T-Urine Microalbumin w/creat. ratio [[id number]] (signed)     Prevention & Chronic Care Immunizations   Influenza vaccine: Fluvax 3+  (05/26/2009)   Influenza vaccine deferral: Not available  (02/01/2010)   Influenza vaccine due: 03/30/2010    Tetanus booster: Not documented   Td booster deferral: Deferred  (01/10/2010)   Tetanus booster due: 06/14/2010    Pneumococcal vaccine: Pneumovax (Medicare)  (05/26/2009)    H. zoster vaccine: Not documented   H. zoster vaccine deferral: Deferred  (01/10/2010)  Colorectal Screening   Hemoccult: Not documented   Hemoccult action/deferral: Not indicated  (03/17/2010)    Colonoscopy: Benign polyps.   (12/04/2007)   Colonoscopy due: 12/03/2012  Other Screening   PSA: Not documented   PSA action/deferral: Discussion deferred  (01/10/2010)   Smoking status: quit  (03/17/2010)  Diabetes Mellitus   HgbA1C: 11.6  (01/10/2010)   Hemoglobin A1C due: 12/12/2008    Eye exam: No diabetic retinopathy.   Significant arteriosclerosis Cataract.   Hypertensive retinopathy   (07/13/2008)   Diabetic eye exam action/deferral:  Ophthalmology referral  (03/17/2010)   Eye exam due: 12/2008    Foot exam: yes  (04/06/2009)   Foot exam action/deferral: Do today   High risk foot: No  (12/30/2006)   Foot care education: Done  (04/06/2009)   Foot exam due: 04/27/2010    Urine microalbumin/creatinine ratio: 627.0  (12/20/2008)   Urine microalbumin action/deferral: Ordered   Urine microalbumin/cr due: 06/09/2009    Diabetes flowsheet reviewed?: Yes   Progress toward A1C goal: Improved  Lipids   Total Cholesterol: 137  (04/22/2009)   LDL: 62  (04/22/2009)   LDL Direct: Not documented   HDL: 38  (04/22/2009)   Triglycerides: 185  (04/22/2009)    SGOT (AST): 27  (10/11/2009)   SGPT (ALT): 34  (10/11/2009)   Alkaline phosphatase: 96  (10/11/2009)   Total bilirubin: 0.7  (10/11/2009)    Lipid flowsheet reviewed?: Yes   Progress toward LDL goal: At goal  Hypertension   Last Blood Pressure: 138 / 79  (03/17/2010)   Serum creatinine: 1.93  (02/01/2010)   BMP action: Ordered   Serum potassium 4.0  (02/01/2010)    Hypertension flowsheet reviewed?: Yes   Progress toward BP goal: Improved  Self-Management Support :   Personal Goals (by the next clinic visit) :     Personal A1C goal: 7  (06/27/2009)     Personal blood pressure goal: 130/80  (06/27/2009)     Personal LDL goal: 70  (06/27/2009)    Patient will work on the following items until the next clinic visit to reach self-care goals:     Medications and monitoring: take my medicines every day, check my blood sugar, check my blood pressure, bring all of my medications to every visit, examine my feet every day  (03/17/2010)     Eating: drink diet soda or water instead of juice or soda, eat more vegetables, use fresh or frozen vegetables, eat foods that are low in salt, eat baked foods instead of fried foods, eat fruit for snacks and desserts, limit or avoid alcohol  (03/17/2010)     Activity: take a 30 minute walk every day  (03/17/2010)    Diabetes  self-management support: Copy of  home glucose meter record, Written self-care plan, Education handout, Resources for patients handout  (03/17/2010)   Diabetes care plan printed   Diabetes education handout printed   Last diabetes self-management training by diabetes educator: 02/24/2010   Last medical nutrition therapy: 10/12/2008    Hypertension self-management support: Written self-care plan, Education handout, Resources for patients handout  (03/17/2010)   Hypertension self-care plan printed.   Hypertension education handout printed    Lipid self-management support: Written self-care plan, Education handout, Resources for patients handout  (03/17/2010)   Lipid self-care plan printed.   Lipid education handout printed      Resource handout printed.   Nursing Instructions: Refer for screening diabetic eye exam (see order)   Appended Document: est-overdue for 2 week recheck/ch Mr. Want' history and physcial examination were reviewed with Dr. Obie Dredge and we formulated the assessment and plan together.  I agree with the above documentation.  Better control of his OSA will likely result in mild improvements in his hypertension and hyperglycemia.

## 2010-08-29 NOTE — Progress Notes (Signed)
Summary: refill/ hla  Phone Note Refill Request Message from:  Patient on August 19, 2009 2:13 PM  Refills Requested: Medication #1:  CARDIZEM 90 MG TABS Take one and half pill twice a day.   Last Refilled: 12/15  Medication #2:  FUROSEMIDE 80 MG TABS Take 1 and half pill a day.   Last Refilled: 1/2 Initial call taken by: Freddy Finner RN,  August 19, 2009 2:14 PM    Prescriptions: FUROSEMIDE 80 MG TABS (FUROSEMIDE) Take 1 and half pill a day.  #45 x 6   Entered and Authorized by:   Dawna Part MD   Signed by:   Dawna Part MD on 08/20/2009   Method used:   Electronically to        [facility] (retail)       [phone]       [place], [place]  [address]       Ph: [phone]       Fax: [phone]   RxID:   [id number] CARDIZEM 90 MG TABS (DILTIAZEM HCL) Take one and half pill twice a day.  #90 x 6   Entered and Authorized by:   Dawna Part MD   Signed by:   Dawna Part MD on 08/20/2009   Method used:   Electronically to        [facility] (retail)       [phone]       [place], [place]  [address]       Ph: [phone]       Fax: [phone]   RxID:   [id number]

## 2010-08-29 NOTE — Assessment & Plan Note (Signed)
Summary: COU/VS  Anticoagulant Therapy Managed by: [name]. [name]  PharmD CACP Referring MD: Dawna Part MD PCP: Dawna Part MD [facility] Attending: Eppie Gibson MD, [place] Indication 1: Atrial fibrillation Indication 2: Aftercare long term use Anticoagulants V58.61,V58.83 Start date: 10/09/2006 Duration: Indefinite  Patient Assessment Reviewed by: [name] PharmD  August 08, 2009 Medication review: verified warfarin dosage & schedule,verified previous prescription medications, verified doses & any changes, verified new medications, reviewed OTC medications, reviewed OTC health products-vitamins supplements etc Complications: none Dietary changes: none   Health status changes: none   Lifestyle changes: none   Recent/future hospitalizations: none   Recent/future procedures: none   Recent/future dental: none Patient Assessment Part 2:  Have you MISSED ANY DOSES or CHANGED TABLETS?  No missed Warfarin doses or changed tablets.  Have you had any BRUISING or BLEEDING ( nose or gum bleeds,blood in urine or stool)?  No reported bruising or bleeding in nose, gums, urine, stool.  Have you STARTED or STOPPED any MEDICATIONS, including OTC meds,herbals or supplements?  No other medications or herbal supplements were started or stopped.  Have you CHANGED your DIET, especially green vegetables,or ALCOHOL intake?  No changes in diet or alcohol intake.  Have you had any ILLNESSES or HOSPITALIZATIONS?  No reported illnesses or hospitalizations  Have you had any signs of CLOTTING?(chest discomfort,dizziness,shortness of breath,arms tingling,slurred speech,swelling or redness in leg)    No chest discomfort, dizziness, shortness of breath, tingling in arm, slurred speech, swelling, or redness in leg.     Treatment  Target INR: 2.0-3.0 INR: 2.7  Date: 08/08/2009 Regimen In:  32.5mg /week INR reflects regimen in: 2.7  New  Tablet strength: : 5mg  Regimen Out:     Sunday: 1 Tablet  Monday: 1 Tablet     Tuesday: 1 Tablet     Wednesday: 1/2 Tablet     Thursday: 1 Tablet      Friday: 1 Tablet     Saturday: 1 Tablet Total Weekly: 32.5mg /week mg  Next INR Due: 09/05/2009 Adjusted by: [name]. Elie Confer III PharmD CACP   Return to anticoagulation clinic:  09/05/2009 Time of next visit: [id number]   Comments: Patient counseled/cautioned regarding falls avoidance with impending inclement/icy conditions expected within next 1-2 days.  Allergies: No Known Drug Allergies

## 2010-08-29 NOTE — Assessment & Plan Note (Signed)
Summary: COU/CH  Anticoagulant Therapy Managed by: [name]. Patrick [name]  PharmD CACP Referring MD: Dawna Part MD PCP: Dawna Part MD [facility] AttendingNance Pew MD, Hebert Soho  Indication 1: Atrial fibrillation Indication 2: Aftercare long term use Anticoagulants V58.61,V58.83 Start date: 10/09/2006 Duration: Indefinite  Patient Assessment Reviewed by: [name] PharmD  February 27, 2010 Medication review: verified warfarin dosage & schedule,verified previous prescription medications, verified doses & any changes, verified new medications, reviewed OTC medications, reviewed OTC health products-vitamins supplements etc Complications: none Dietary changes: none   Health status changes: none   Lifestyle changes: none   Recent/future hospitalizations: none   Recent/future procedures: none   Recent/future dental: none Patient Assessment Part 2:  Have you MISSED ANY DOSES or CHANGED TABLETS?  No missed Warfarin doses or changed tablets.  Have you had any BRUISING or BLEEDING ( nose or gum bleeds,blood in urine or stool)?  No reported bruising or bleeding in nose, gums, urine, stool.  Have you STARTED or STOPPED any MEDICATIONS, including OTC meds,herbals or supplements?  No other medications or herbal supplements were started or stopped.  Have you CHANGED your DIET, especially green vegetables,or ALCOHOL intake?  No changes in diet or alcohol intake.  Have you had any ILLNESSES or HOSPITALIZATIONS?  No reported illnesses or hospitalizations  Have you had any signs of CLOTTING?(chest discomfort,dizziness,shortness of breath,arms tingling,slurred speech,swelling or redness in leg)    No chest discomfort, dizziness, shortness of breath, tingling in arm, slurred speech, swelling, or redness in leg.     Treatment  Target INR: 2.0-3.0 INR: 2.4  Date: 02/27/2010 Regimen In:  32.5mg /week INR reflects regimen in: 2.4  New  Tablet strength: : 5mg  Regimen Out:     Sunday: 1 Tablet  Monday: 1 Tablet     Tuesday: 1 Tablet     Wednesday: 1/2 Tablet     Thursday: 1 Tablet      Friday: 1 Tablet     Saturday: 1 Tablet Total Weekly: 32.5mg /week mg  Next INR Due: 03/27/2010 Adjusted by: [name]. Elie Confer III PharmD CACP   Return to anticoagulation clinic:  03/27/2010 Time of next visit: 1415    Allergies: No Known Drug Allergies

## 2010-08-29 NOTE — Letter (Signed)
Summary: LOG BOOK REPORT 12-12-2009-01-10-2010  LOG BOOK REPORT 12-12-2009-01-10-2010   Imported By: Garlan Fillers 01/10/2010 15:13:44  _____________________________________________________________________  External Attachment:    Type:   Image     Comment:   External Document

## 2010-08-29 NOTE — Letter (Signed)
Summary: BLOOD GLUCOSE   BLOOD GLUCOSE   Imported By: Garlan Fillers 07/04/2010 10:04:32  _____________________________________________________________________  External Attachment:    Type:   Image     Comment:   External Document

## 2010-08-29 NOTE — Assessment & Plan Note (Signed)
Summary: COU/VS  Anticoagulant Therapy Managed by: [name]. [name]  PharmD CACP Referring MD: Dawna Part MD PCP: Dawna Part MD [facility] Attending: Bertha Stakes MD Indication 1: Atrial fibrillation Indication 2: Aftercare long term use Anticoagulants V58.61,V58.83 Start date: 10/09/2006 Duration: Indefinite  Patient Assessment Reviewed by: [name] PharmD  October 03, 2009 Medication review: verified warfarin dosage & schedule,verified previous prescription medications, verified doses & any changes, verified new medications, reviewed OTC medications, reviewed OTC health products-vitamins supplements etc Complications: none Dietary changes: none   Health status changes: none   Lifestyle changes: none   Recent/future hospitalizations: none   Recent/future procedures: none   Recent/future dental: none Patient Assessment Part 2:  Have you MISSED ANY DOSES or CHANGED TABLETS?  No missed Warfarin doses or changed tablets.  Have you had any BRUISING or BLEEDING ( nose or gum bleeds,blood in urine or stool)?  No reported bruising or bleeding in nose, gums, urine, stool.  Have you STARTED or STOPPED any MEDICATIONS, including OTC meds,herbals or supplements?  No other medications or herbal supplements were started or stopped.  Have you CHANGED your DIET, especially green vegetables,or ALCOHOL intake?  No changes in diet or alcohol intake.  Have you had any ILLNESSES or HOSPITALIZATIONS?  No reported illnesses or hospitalizations  Have you had any signs of CLOTTING?(chest discomfort,dizziness,shortness of breath,arms tingling,slurred speech,swelling or redness in leg)    No chest discomfort, dizziness, shortness of breath, tingling in arm, slurred speech, swelling, or redness in leg.     Treatment  Target INR: 2.0-3.0 INR: 3.8  Date: 10/03/2009 Regimen In:  32.5mg /week INR reflects regimen in: 3.8  New  Tablet strength: : 5mg  Regimen Out:     Sunday: 1 Tablet  Monday: 1/2 Tablet     Tuesday: 1/2 Tablet     Wednesday: 1 Tablet     Thursday: 1 Tablet      Friday: 1 Tablet     Saturday: 1 Tablet Total Weekly: 30.0mg /week mg  Next INR Due: 10/17/2009   Return to anticoagulation clinic:  10/17/2009    Allergies: No Known Drug Allergies

## 2010-08-29 NOTE — Progress Notes (Signed)
Summary: med refill/gp  Phone Note Refill Request Message from:  Fax from Pharmacy on Dec 16, 2009 11:20 AM  Refills Requested: Medication #1:  TRUETRACK TEST  STRP use to test blood sugar 2-3 times daily [BMN]   Last Refilled: 12/13/2009  Method Requested: Electronic Initial call taken by: Morrison Old RN,  Dec 16, 2009 11:20 AM  Follow-up for Phone Call        Rx written Follow-up by: Alcide Evener MD,  Dec 16, 2009 12:04 PM    Prescriptions: Angelia Mould TEST  STRP (GLUCOSE BLOOD) use to test blood sugar 2-3 times daily Brand medically necessary #100 x 11   Entered and Authorized by:   Alcide Evener MD   Signed by:   Rhina Brackett Dam MD on 12/16/2009   Method used:   Electronically to        [facility] (retail)       [phone]       [place], [place]  [address]       Ph: [phone]       Fax: [phone]   RxID:   [id number]

## 2010-08-29 NOTE — Progress Notes (Signed)
Summary: med refill/gp  Phone Note Refill Request Message from:  Fax from Pharmacy on October 27, 2009 4:20 PM  Refills Requested: Medication #1:  PRAVACHOL 40 MG TABS Take 2  tablets by mouth once a day   Last Refilled: 09/15/2009  Method Requested: Electronic Initial call taken by: Morrison Old RN,  October 27, 2009 4:20 PM    Prescriptions: PRAVACHOL 40 MG TABS (PRAVASTATIN SODIUM) Take 2  tablets by mouth once a day  #60 x 11   Entered and Authorized by:   Dawna Part MD   Signed by:   Dawna Part MD on 10/27/2009   Method used:   Electronically to        [facility] (retail)       [phone]       [place], [place]  [address]       Ph: [phone]       Fax: [phone]   RxID:   [id number]

## 2010-08-29 NOTE — Progress Notes (Signed)
Summary: Refill/gh  Phone Note Refill Request Message from:  Fax from Pharmacy on July 04, 2010 12:28 PM  Refills Requested: Medication #1:  CARDIZEM 90 MG TABS Two tabs twice a day  Medication #2:  CLONIDINE HCL 0.3 MG TABS Take 1 tablet by mouth two times a day for blood pressure Last appointment was 06/30/2010.  Labs were done 02/01/2010.   Method Requested: Electronic Initial call taken by: Sander Nephew RN,  July 04, 2010 12:29 PM  Follow-up for Phone Call       Follow-up by: Larey Dresser MD,  July 04, 2010 2:47 PM    Prescriptions: CARDIZEM 90 MG TABS (DILTIAZEM HCL) Two tabs twice a day  #120 x 5   Entered and Authorized by:   Larey Dresser MD   Signed by:   Larey Dresser MD on 07/04/2010   Method used:   Electronically to        [facility] (retail)       [phone]       [place], [place]  [address]       Ph: [phone]       Fax: [phone]   RxIDAR:[id number] CLONIDINE HCL 0.3 MG TABS (CLONIDINE HCL) Take 1 tablet by mouth two times a day for blood pressure  #60 x 5   Entered and Authorized by:   Larey Dresser MD   Signed by:   Larey Dresser MD on 07/04/2010   Method used:   Electronically to        [facility] (retail)       [phone]       [place], [place]  [address]       Ph: [phone]       Fax: [phone]   RxID:   [id number]

## 2010-08-29 NOTE — Assessment & Plan Note (Signed)
Summary: COU/CH  Anticoagulant Therapy Managed by: [name]. Patrick [name]  PharmD CACP Referring MD: Obie Dredge PCP: Trish Fountain MD Indication 1: Atrial fibrillation Indication 2: Aftercare long term use Anticoagulants V58.61,V58.83 Start date: 10/09/2006 Duration: Indefinite  Patient Assessment Reviewed by: [name] PharmD  May 08, 2010 Medication review: verified warfarin dosage & schedule,verified previous prescription medications, verified doses & any changes, verified new medications, reviewed OTC medications, reviewed OTC health products-vitamins supplements etc Complications: none Dietary changes: none   Health status changes: none   Lifestyle changes: none   Recent/future hospitalizations: none   Recent/future procedures: none   Recent/future dental: none Patient Assessment Part 2:  Have you MISSED ANY DOSES or CHANGED TABLETS?  No missed Warfarin doses or changed tablets.  Have you had any BRUISING or BLEEDING ( nose or gum bleeds,blood in urine or stool)?  No reported bruising or bleeding in nose, gums, urine, stool.  Have you STARTED or STOPPED any MEDICATIONS, including OTC meds,herbals or supplements?  No other medications or herbal supplements were started or stopped.  Have you CHANGED your DIET, especially green vegetables,or ALCOHOL intake?  No changes in diet or alcohol intake.  Have you had any ILLNESSES or HOSPITALIZATIONS?  No reported illnesses or hospitalizations  Have you had any signs of CLOTTING?(chest discomfort,dizziness,shortness of breath,arms tingling,slurred speech,swelling or redness in leg)    No chest discomfort, dizziness, shortness of breath, tingling in arm, slurred speech, swelling, or redness in leg.     Treatment  Target INR: 2.0-3.0 INR: 2.1  Date: 05/08/2010 Regimen In:  32.5mg /week INR reflects regimen in: 2.1  New  Tablet strength: : 5mg  Regimen Out:     Sunday: 1 Tablet     Monday: 1 Tablet     Tuesday: 1  Tablet     Wednesday: 1 Tablet     Thursday: 1 Tablet      Friday: 1 Tablet     Saturday: 1 Tablet Total Weekly: 35.0mg /week mg  Next INR Due: 06/05/2010 Adjusted by: [name]. Elie Confer III PharmD CACP   Return to anticoagulation clinic:  06/05/2010 Time of next visit: 1100    Allergies: No Known Drug Allergies

## 2010-08-29 NOTE — Assessment & Plan Note (Signed)
Summary: COU/CH  Anticoagulant Therapy Managed by: [name]. [name]  PharmD CACP Referring MD: Obie Dredge PCP: Trish Fountain MD [facility] Attending: Bertha Stakes MD Indication 1: Atrial fibrillation Indication 2: Aftercare long term use Anticoagulants V58.61,V58.83 Start date: 10/09/2006 Duration: Indefinite  Patient Assessment Reviewed by: [name] PharmD  April 10, 2010 Medication review: verified warfarin dosage & schedule,verified previous prescription medications, verified doses & any changes, verified new medications, reviewed OTC medications, reviewed OTC health products-vitamins supplements etc Complications: none Dietary changes: none   Health status changes: none   Lifestyle changes: none   Recent/future hospitalizations: none   Recent/future procedures: none   Recent/future dental: none Patient Assessment Part 2:  Have you MISSED ANY DOSES or CHANGED TABLETS?  No missed Warfarin doses or changed tablets.  Have you had any BRUISING or BLEEDING ( nose or gum bleeds,blood in urine or stool)?  No reported bruising or bleeding in nose, gums, urine, stool.  Have you STARTED or STOPPED any MEDICATIONS, including OTC meds,herbals or supplements?  No other medications or herbal supplements were started or stopped.  Have you CHANGED your DIET, especially green vegetables,or ALCOHOL intake?  No changes in diet or alcohol intake.  Have you had any ILLNESSES or HOSPITALIZATIONS?  No reported illnesses or hospitalizations  Have you had any signs of CLOTTING?(chest discomfort,dizziness,shortness of breath,arms tingling,slurred speech,swelling or redness in leg)    No chest discomfort, dizziness, shortness of breath, tingling in arm, slurred speech, swelling, or redness in leg.     Treatment  Target INR: 2.0-3.0 INR: 2.8  Date: 04/10/2010 Regimen In:  32.5mg /week INR reflects regimen in: 2.8  New  Tablet strength: : 5mg  Regimen Out:     Sunday: 1 Tablet  Monday: 1 Tablet     Tuesday: 1 Tablet     Wednesday: 1/2 Tablet     Thursday: 1 Tablet      Friday: 1 Tablet     Saturday: 1 Tablet Total Weekly: 32.5mg /week mg  Next INR Due: 05/08/2010 Adjusted by: [name]. Elie Confer III PharmD CACP   Return to anticoagulation clinic:  05/08/2010 Time of next visit: 1130    Allergies: No Known Drug Allergies

## 2010-08-29 NOTE — Assessment & Plan Note (Signed)
Summary: FLU SHOT/CH  Nurse Visit   Allergies: No Known Drug Allergies  Orders Added: 1)  Flu Vaccine 12yrs + MEDICARE PATIENTS [Q2039] 2)  Administration Flu vaccine - MCR [id number] Flu Vaccine Consent Questions     Do you have a history of severe allergic reactions to this vaccine? no    Any prior history of allergic reactions to egg and/or gelatin? no    Do you have a sensitivity to the preservative Thimersol? no    Do you have a past history of Guillan-Barre Syndrome? no    Do you currently have an acute febrile illness? no    Have you ever had a severe reaction to latex? no    Vaccine information given and explained to patient? yes    Are you currently pregnant? no    Lot Number:[id number]   Exp Date:01/27/2011   Manufacturer: [employer]    Site Given  right Deltoid IM.Mateo Flow ([place])  May 08, 2010 11:22 AM .[place]

## 2010-08-29 NOTE — Assessment & Plan Note (Signed)
Summary: COU/CH  Anticoagulant Therapy Managed by: [name]. [name]  PharmD CACP Referring MD: Dawna Part MD PCP: Dawna Part MD Indication 1: Atrial fibrillation Indication 2: Aftercare long term use Anticoagulants V58.61,V58.83 Start date: 10/09/2006 Duration: Indefinite  Patient Assessment Reviewed by: [name] PharmD  Dec 19, 2009 Medication review: verified warfarin dosage & schedule,verified previous prescription medications, verified doses & any changes, verified new medications, reviewed OTC medications, reviewed OTC health products-vitamins supplements etc Complications: none Dietary changes: none   Health status changes: none   Lifestyle changes: none   Recent/future hospitalizations: none   Recent/future procedures: none   Recent/future dental: none Patient Assessment Part 2:  Have you MISSED ANY DOSES or CHANGED TABLETS?  No missed Warfarin doses or changed tablets.  Have you had any BRUISING or BLEEDING ( nose or gum bleeds,blood in urine or stool)?  No reported bruising or bleeding in nose, gums, urine, stool.  Have you STARTED or STOPPED any MEDICATIONS, including OTC meds,herbals or supplements?  No other medications or herbal supplements were started or stopped.  Have you CHANGED your DIET, especially green vegetables,or ALCOHOL intake?  No changes in diet or alcohol intake.  Have you had any ILLNESSES or HOSPITALIZATIONS?  No reported illnesses or hospitalizations  Have you had any signs of CLOTTING?(chest discomfort,dizziness,shortness of breath,arms tingling,slurred speech,swelling or redness in leg)    No chest discomfort, dizziness, shortness of breath, tingling in arm, slurred speech, swelling, or redness in leg.     Treatment  Target INR: 2.0-3.0 INR: 2.9  Date: 12/19/2009 Regimen In:  32.5mg /week INR reflects regimen in: 2.9  New  Tablet strength: : 5mg  Regimen Out:     Sunday: 1 Tablet     Monday: 1 Tablet     Tuesday: 1  Tablet     Wednesday: 1/2 Tablet     Thursday: 1 Tablet      Friday: 1 Tablet     Saturday: 1 Tablet Total Weekly: 32.5mg /week mg  Next INR Due: 01/16/2010 Adjusted by: [name]. Elie Confer III PharmD CACP   Return to anticoagulation clinic:  01/16/2010 Time of next visit: 1415    Allergies: No Known Drug Allergies

## 2010-08-29 NOTE — Progress Notes (Signed)
Summary: call to followup on insulin inhections/dmr  Phone Note Outgoing Call   Call placed by: Barnabas Harries RD,CDE,  March 06, 2010 10:19 AM Summary of Call: called patient to see how he was doing with insulin injections: he still has not gotten insulin- says [facility] todl him to come back Tuesday 03/07/10. (I hope he is not telling me this to put off taking insulin injections). He agreed to let me ask someone to call him at the end of this week to follow up and provide support.  will request triage nurse call him 03/10/10.   Follow-up for Phone Call        called [facility] on [address]- the one he confimed today that he goes to- they have 2 cases of Humulin N insulin. Also called Medicare and they confirmed he does not have part D Medicare Follow-up by: Barnabas Harries RD,CDE,  March 06, 2010 10:44 AM  Additional Follow-up for Phone Call Additional follow up Details #1::        called patient and discussed above: he agreed to go get the insulin ( can use the syringes I gave him) and said he does have the 25.00. I also received his permission to make a referral to our social worker for assistance on drug coverage. Referral made. Additional Follow-up by: Barnabas Harries RD,CDE,  March 06, 2010 10:45 AM

## 2010-08-29 NOTE — Assessment & Plan Note (Signed)
Summary: EST-CK/FU/MEDS/CFB   Vital Signs:  Patient profile:   65 year old male Height:      68 inches (172.72 cm) Weight:      220.2 pounds (100.09 kg) BMI:     33.60 Temp:     98.1 degrees F (36.72 degrees C) oral Pulse rate:   67 / minute BP sitting:   155 / 85  (right arm) Cuff size:   large  Vitals Entered By: Mateo Flow Deborra Medina) (February 01, 2010 3:01 PM) CC: f/u dm Is Patient Diabetic? Yes Did you bring your meter with you today? No Pain Assessment Patient in pain? no      Nutritional Status BMI of > 30 = obese  Have you ever been in a relationship where you felt threatened, hurt or afraid?No   Does patient need assistance? Functional Status Self care Ambulation Normal   Primary Care Provider:  Dawna Part MD  CC:  f/u dm.  History of Present Illness: Patient is a 65 year old man here for a follow up of his diabetes care.  He states today that he feels good.  Denies any headaches, fever, chills, polyuria, tingling or numbness in his feet.  He is complient with his medications.  He is here today to discuss starting insulin as well as refills of other medications.  Depression History:      The patient denies a depressed mood most of the day and a diminished interest in his usual daily activities.        The patient denies that he feels like life is not worth living, denies that he wishes that he were dead, and denies that he has thought about ending his life.         Habits & Providers  Alcohol-Tobacco-Diet     Tobacco Status: quit     Year Quit: 75yrs ago  Current Medications (verified): 1)  Aspirin 81 Mg Tbec (Aspirin) .... Take 1 Tablet By Mouth Once A Day 2)  Benazepril Hcl 40 Mg Tabs (Benazepril Hcl) .... Take 2 Tablets By Mouth Once A Day 3)  Glipizide 10 Mg Tabs (Glipizide) .... Take 1and Half Tablet By Mouth Twice A Day 4)  Pravachol 40 Mg Tabs (Pravastatin Sodium) .... Take 2  Tablets By Mouth Once A Day 5)  Famotidine 20 Mg Tabs (Famotidine)  .... Take 2 Tablets By Mouth Once A Day 6)  Catapres 0.2 Mg Tabs (Clonidine Hcl) .... Take 1 Tablet By Mouth Two Times A Day 7)  Furosemide 80 Mg Tabs (Furosemide) .... Take 1 and Half Pill A Day. 8)  Truetrack Test  Strp (Glucose Blood) .... Use To Test Blood Sugar 2-3 Times Daily 9)  Lancets 30g  Misc (Lancets) .... Use To Test Blood Glucose 2-3 Times Daily 10)  Warfarin Sodium 5 Mg Tabs (Warfarin Sodium) .... Take As Directed. 11)  Cardizem 90 Mg Tabs (Diltiazem Hcl) .... Two Tabs Twice A Day 12)  Fish Oil 1000 Mg Caps (Omega-3 Fatty Acids) .Marland Kitchen.. 1 Capsule Twice Daily  Allergies (verified): No Known Drug Allergies  Past History:  Past medical, surgical, family and social histories (including risk factors) reviewed for relevance to current acute and chronic problems.  Past Medical History: Reviewed history from 06/09/2008 and no changes required. Hypertension:Uncontrolled                       H/o medical noncompliance  Hospitalised*2 for HTN urgency                       w/u for refractory HTN neg incl RAS(neg MRA) Non-obstructive [id number] cath 9/05 by [facility]                                     20-30%proxRCA,30%proxLAD Diabetes mellitus, type II Hyperlipidemia H/o CVA:Rt Cerebellar hemorrhage 1/07 Chronic Kidney disease:Stage 3                                       Secondary to poorly controlled HTN                                       Baseline creatinine 2.0-2.2 but recent values 1.7.                                       Followed @[facility] ,Dr.Goldsborough Paroxysmal Atrial Fibrillation:2D ECHO(5/07):LVef 60-70%,mild aortic root dilatation,LA mod dilated,A.fib                                                         Off anticoagulation sec to cerebellar hemorrhage H/o Bradycardia:with mild pause 1.2sec while on beta blocker 1/07.                           EP:No pacer needed.No evidence of SAN dysfunction Depression Gout:First episode  11/07;Hospitalized Mildly enlarged prostate on Renal U/S 1/07 Sleep apnea  Family History: Reviewed history from 09/19/2006 and no changes required. Brother had CA of unknown origin. Currently deceased.  Social History: Reviewed history from 06/09/2008 and no changes required. Married Quit smoking and no etoh in 32 years ago  Review of Systems      See HPI  Physical Exam  Additional Exam:  General: Well developed, male in no acute distress  Head: Atraumatic, normocephalic with no signs of trauma  Eyes: PERRLA, EOM intact  Ears: TM intact  Nose: Nares patent, mucosa is pink and moist, no polyps noted  Mouth: Mucosa is pink and moist, dention,   Neck: Supple, full ROM, no thyromegaly or masses noted  Resp: Clear to ascultation bilaterally, no wheezes, rales, or rhonchi noted  CV: Regular rate and rhythm with no murmurs, rubs, or gallops noted  Abdomen: Soft, non-tender, non-distended with normal bowel sounds  Musculoskelatal: ROM full with intact strength, no pain to palpation  Neurologic: Alert and oriented x3, Cranial nerves II-XII grossly intact, DTR normal and symmetric  Pulses: Radial, brachial, carotid, femoral, dorsal pedis, and posterior tibial pulses equal and symmetric     Impression & Recommendations:  Problem # 1:  DIABETES MELLITUS, TYPE II (ICD-250.00)  He is still hesitant to try insulin.  He was advised and shown the needle system and the fact that Lantus is a very mild insulin as well as would cost the same as any of the older insulins with his Medicare.  He was also advised to continue his current attempt at diet and exercise as it is helping but just not enough.  Other options such as Actos were also discussed including the side effect of swelling which he already has a problem with.  He will return to clinic in 2-3 weeks to decide on a course of action at that time.   The following medications were removed from the medication list:    Lantus 100 Unit/ml Soln  (Insulin glargine) .Marland KitchenMarland KitchenMarland KitchenMarland Kitchen 15 units subcutanous ly daily His updated medication list for this problem includes:    Aspirin 81 Mg Tbec (Aspirin) .Marland Kitchen... Take 1 tablet by mouth once a day    Benazepril Hcl 40 Mg Tabs (Benazepril hcl) .Marland Kitchen... Take 2 tablets by mouth once a day    Glipizide 10 Mg Tabs (Glipizide) .Marland Kitchen... Take 1and half tablet by mouth twice a day  Labs Reviewed: Creat: 1.74 (01/10/2010)     Last Eye Exam: No diabetic retinopathy.   Significant arteriosclerosis Cataract.   Hypertensive retinopathy  (07/13/2008) Reviewed HgBA1c results: 11.6 (01/10/2010)  13.4 (10/11/2009)  Problem # 2:  HYPERTENSION (ICD-401.9)  His blood pressure was high today.  We will consider increasing his Catapress at his next visit if it continues to be high.  We will also recheck his Bmet today because of a low potassium in June.  If he continues to be low or worse then the last reading we will add a potassium supplement and reassess.  His updated medication list for this problem includes:    Benazepril Hcl 40 Mg Tabs (Benazepril hcl) .Marland Kitchen... Take 2 tablets by mouth once a day    Catapres 0.2 Mg Tabs (Clonidine hcl) .Marland Kitchen... Take 1 tablet by mouth two times a day    Furosemide 80 Mg Tabs (Furosemide) .Marland Kitchen... Take 1 and half pill a day.    Cardizem 90 Mg Tabs (Diltiazem hcl) .Marland Kitchen..Marland Kitchen Two tabs twice a day  Orders: T-Basic Metabolic Panel ([id number])  BP today: 155/85 Prior BP: 148/83 (01/10/2010)  Labs Reviewed: K+: 3.4 (01/10/2010) Creat: : 1.74 (01/10/2010)   Chol: 137 (04/22/2009)   HDL: 38 (04/22/2009)   LDL: 62 (04/22/2009)   TG: 185 (04/22/2009)  Complete Medication List: 1)  Aspirin 81 Mg Tbec (Aspirin) .... Take 1 tablet by mouth once a day 2)  Benazepril Hcl 40 Mg Tabs (Benazepril hcl) .... Take 2 tablets by mouth once a day 3)  Glipizide 10 Mg Tabs (Glipizide) .... Take 1and half tablet by mouth twice a day 4)  Pravachol 40 Mg Tabs (Pravastatin sodium) .... Take 2  tablets by mouth once a day 5)   Famotidine 20 Mg Tabs (Famotidine) .... Take 2 tablets by mouth once a day 6)  Catapres 0.2 Mg Tabs (Clonidine hcl) .... Take 1 tablet by mouth two times a day 7)  Furosemide 80 Mg Tabs (Furosemide) .... Take 1 and half pill a day. 8)  Truetrack Test Strp (Glucose blood) .... Use to test blood sugar 2-3 times daily 9)  Lancets 30g Misc (Lancets) .... Use to test blood glucose 2-3 times daily 10)  Warfarin Sodium 5 Mg Tabs (Warfarin sodium) .... Take as directed. 11)  Cardizem 90 Mg Tabs (Diltiazem hcl) .... Two tabs twice a day 12)  Fish Oil 1000 Mg Caps (Omega-3 fatty acids) .Marland Kitchen.. 1 capsule twice daily  Patient Instructions: 1)  Continue on current blood pressure medications. 2)  Continue other medications as prescribed. 3)  Please consider starting insulin.  Check on your Tier  2 copay with Medicare plan for what the monthly cost would be. 4)  Return to clinic in 2-3 weeks, sooner if questions or concerns arise. Prescriptions: FUROSEMIDE 80 MG TABS (FUROSEMIDE) Take 1 and half pill a day.  #45 x 6   Entered and Authorized by:   Trish Fountain MD   Signed by:   Trish Fountain MD on 02/01/2010   Method used:   Electronically to        [facility] (retail)       [phone]       [place], [place]  [address]       Ph: [phone]       Fax: [phone]   RxID:   [id number] FAMOTIDINE 20 MG TABS (FAMOTIDINE) Take 2 tablets by mouth once a day  #62 x 6   Entered and Authorized by:   Trish Fountain MD   Signed by:   Trish Fountain MD on 02/01/2010   Method used:   Electronically to        [facility] (retail)       [phone]       [place], [place]  [address]       Ph: [phone]       Fax: [phone]   RxID:   [id number]  Process Orders Check Orders Results:     [facility]: Check successful Tests Sent for requisitioning (February 01, 2010 4:14  PM):     02/01/2010: [facility] -- T-Basic Metabolic Panel 0000000 (signed)    Prevention & Chronic Care Immunizations   Influenza vaccine: Fluvax 3+  (05/26/2009)   Influenza vaccine deferral: Not available  (02/01/2010)   Influenza vaccine due: 03/30/2010    Tetanus booster: Not documented   Td booster deferral: Deferred  (01/10/2010)    Pneumococcal vaccine: Pneumovax (Medicare)  (05/26/2009)    H. zoster vaccine: Not documented   H. zoster vaccine deferral: Deferred  (01/10/2010)    Immunization comments: We will do his Tetanus at his follow up appointment in 2-3 weeks.  Colorectal Screening   Hemoccult: Not documented   Hemoccult action/deferral: Deferred  (01/10/2010)    Colonoscopy: Benign polyps.   (12/04/2007)   Colonoscopy due: 11/2012  Other Screening   PSA: Not documented   PSA action/deferral: Discussion deferred  (01/10/2010)   Smoking status: quit  (02/01/2010)  Diabetes Mellitus   HgbA1C: 11.6  (01/10/2010)   Hemoglobin A1C due: 12/12/2008    Eye exam: No diabetic retinopathy.   Significant arteriosclerosis Cataract.   Hypertensive retinopathy   (07/13/2008)   Eye exam due: 12/2008    Foot exam: yes  (04/06/2009)   Foot exam action/deferral: Do today   High risk foot: No  (12/30/2006)   Foot care education: Done  (04/06/2009)   Foot exam due: 12/30/2007    Urine microalbumin/creatinine ratio: 627.0  (12/20/2008)   Urine microalbumin/cr due: 06/09/2009    Diabetes flowsheet reviewed?: Yes   Progress toward A1C goal: Unchanged  Lipids   Total Cholesterol: 137  (04/22/2009)   LDL: 62  (04/22/2009)   LDL Direct: Not documented   HDL: 38  (04/22/2009)   Triglycerides: 185  (04/22/2009)    SGOT (AST): 27  (10/11/2009)   SGPT (ALT): 34  (10/11/2009)   Alkaline phosphatase: 96  (10/11/2009)   Total bilirubin: 0.7  (10/11/2009)    Lipid flowsheet  reviewed?: Yes   Progress toward LDL goal: At goal  Hypertension    Last Blood Pressure: 155 / 85  (02/01/2010)   Serum creatinine: 1.74  (01/10/2010)   BMP action: Ordered   Serum potassium 3.4  (01/10/2010)    Hypertension flowsheet reviewed?: Yes   Progress toward BP goal: Unchanged  Self-Management Support :   Personal Goals (by the next clinic visit) :     Personal A1C goal: 7  (06/27/2009)     Personal blood pressure goal: 130/80  (06/27/2009)     Personal LDL goal: 70  (06/27/2009)    Patient will work on the following items until the next clinic visit to reach self-care goals:     Medications and monitoring: take my medicines every day  (02/01/2010)     Eating: drink diet soda or water instead of juice or soda, eat more vegetables, eat foods that are low in salt, eat baked foods instead of fried foods  (02/01/2010)     Activity: take a 30 minute walk every day  (02/01/2010)    Diabetes self-management support: Pre-printed educational material, Resources for patients handout, Written self-care plan  (02/01/2010)   Diabetes care plan printed   Last diabetes self-management training by diabetes educator: 07/19/2008   Last medical nutrition therapy: 10/12/2008    Hypertension self-management support: Pre-printed educational material, Resources for patients handout, Written self-care plan  (02/01/2010)   Hypertension self-care plan printed.    Lipid self-management support: Pre-printed educational material, Resources for patients handout, Written self-care plan  (02/01/2010)   Lipid self-care plan printed.      Resource handout printed.

## 2010-08-29 NOTE — Assessment & Plan Note (Signed)
Summary: FU VISIT/DS   Vital Signs:  Patient profile:   65 year old male Height:      68 inches (172.72 cm) Weight:      227.0 pounds (103.18 kg) BMI:     34.64 Temp:     97.8 degrees F (36.56 degrees C) oral Pulse rate:   73 / minute BP sitting:   199 / 110  (left arm)  Vitals Entered By: Hilda Blades Ditzler RN (June 30, 2010 1:36 PM) Is Patient Diabetic? Yes Did you bring your meter with you today? Yes Pain Assessment Patient in pain? no      Nutritional Status BMI of > 30 = obese Nutritional Status Detail appetite good CBG Result 206  Have you ever been in a relationship where you felt threatened, hurt or afraid?denies   Does patient need assistance? Functional Status Self care Ambulation Normal Comments FU   Primary Care Provider:  Trish Fountain MD   History of Present Illness: Patient is a 65 year old man who presents today for follow up his chronic conditions including diabetes, hypertension, hyperlipidemia, CAD, and atrial fibrilation.  He has been doing well and has no active complaints today.  He does state that he has been out of his benazapril for 2-3 days.  His blood pressure is elevated today at 199/110.  He denies any headaches, blurred vision, spotty vision, chest pain, SOB, swelling in his legs, or change in his urination.    He states that he has been taking his insulin since his last visit in August.  He does check his blood sugars and those have been running 170-200s even on his insulin.  He did not bring his meter today.   Depression History:      The patient denies a depressed mood most of the day and a diminished interest in his usual daily activities.        The patient denies that he feels like life is not worth living, denies that he wishes that he were dead, and denies that he has thought about ending his life.         Preventive Screening-Counseling & Management  Alcohol-Tobacco     Alcohol drinks/day: 0     Smoking Status: quit  Year Quit: 26yrs ago     Passive Smoke Exposure: no  Caffeine-Diet-Exercise     Does Patient Exercise: yes     Type of exercise:     WALKING     Times/week: 2  Current Medications (verified): 1)  Aspirin 81 Mg Tbec (Aspirin) .... Take 1 Tablet By Mouth Once A Day 2)  Benazepril Hcl 40 Mg Tabs (Benazepril Hcl) .... Take 2 Tablets By Mouth Once A Day 3)  Glipizide 10 Mg Tabs (Glipizide) .... Take 1and Half Tablet By Mouth Twice A Day 4)  Pravachol 40 Mg Tabs (Pravastatin Sodium) .... Take 2  Tablets By Mouth Once A Day 5)  Famotidine 20 Mg Tabs (Famotidine) .... Take 2 Tablets By Mouth Once A Day 6)  Clonidine Hcl 0.3 Mg Tabs (Clonidine Hcl) .... Take 1 Tablet By Mouth Two Times A Day For Blood Pressure 7)  Furosemide 80 Mg Tabs (Furosemide) .... Take 1 and Half Pill A Day. 8)  Truetrack Test  Strp (Glucose Blood) .... Use To Test Blood Sugar 2-3 Times Daily 9)  Lancets 30g  Misc (Lancets) .... Use To Test Blood Glucose 2-3 Times Daily 10)  Warfarin Sodium 5 Mg Tabs (Warfarin Sodium) .... Take As Directed. 11)  Cardizem 90 Mg Tabs (Diltiazem Hcl) .... Two Tabs Twice A Day 12)  Fish Oil 1000 Mg Caps (Omega-3 Fatty Acids) .Marland Kitchen.. 1 Capsule Twice Daily 13)  Humulin N 100 Unit/ml Susp (Insulin Isophane Human) .... Inject 15 Units At Bedtime. 14)  Elite-Thin Insulin Syringe 31g X 5/16" 0.5 Ml Misc (Insulin Syringe-Needle U-100) .... Use To Inject Insulin Daily (250) 15)  Relion Insulin Syringe 31g X 5/16" 0.5 Ml Misc (Insulin Syringe-Needle U-100) .... Use To Inject Relion Humulin Insulin Once Time A Day  (250.00)  Allergies (verified): No Known Drug Allergies  Past History:  Past medical, surgical, family and social histories (including risk factors) reviewed, and no changes noted (except as noted below).  Past Medical History: Reviewed history from 06/09/2008 and no changes required. Hypertension:Uncontrolled                       H/o medical noncompliance                        Hospitalised*2 for HTN urgency                       w/u for refractory HTN neg incl RAS(neg MRA) Non-obstructive [id number] cath 9/05 by [facility]                                     20-30%proxRCA,30%proxLAD Diabetes mellitus, type II Hyperlipidemia H/o CVA:Rt Cerebellar hemorrhage 1/07 Chronic Kidney disease:Stage 3                                       Secondary to poorly controlled HTN                                       Baseline creatinine 2.0-2.2 but recent values 1.7.                                       Followed @[facility] ,Dr.Goldsborough Paroxysmal Atrial Fibrillation:2D ECHO(5/07):LVef 60-70%,mild aortic root dilatation,LA mod dilated,A.fib                                                         Off anticoagulation sec to cerebellar hemorrhage H/o Bradycardia:with mild pause 1.2sec while on beta blocker 1/07.                           EP:No pacer needed.No evidence of SAN dysfunction Depression Gout:First episode 11/07;Hospitalized Mildly enlarged prostate on Renal U/S 1/07 Sleep apnea  Family History: Reviewed history from 09/19/2006 and no changes required. Brother died of CA of unknown origin.   Social History: Reviewed history from 06/09/2008 and no changes required. Married Quit smoking and no etoh in 32 years ago  Review of Systems  The patient denies anorexia, fever, weight loss, weight gain, vision loss, decreased hearing, hoarseness, chest pain, syncope, dyspnea on exertion, peripheral edema, prolonged cough, headaches,  hemoptysis, abdominal pain, melena, hematochezia, severe indigestion/heartburn, hematuria, incontinence, genital sores, muscle weakness, suspicious skin lesions, transient blindness, difficulty walking, depression, unusual weight change, abnormal bleeding, enlarged lymph nodes, angioedema, and testicular masses.    Physical Exam  Additional Exam:  General: Well developed, male in no acute distress.  Vital signs reviewed.  Head:  Atraumatic, normocephalic with no signs of trauma  Eyes: PERRLA, EOM intact  Ears: TM intact  Nose: Nares patent, mucosa is pink and moist, no polyps noted  Mouth: Mucosa is pink and moist, good dention,   Neck: Supple, full ROM, no thyromegaly or masses noted  Resp: Clear to ascultation bilaterally, no wheezes, rales, or rhonchi noted  CV: Regular rate and rhythm with no murmurs, rubs, or gallops noted  Abdomen: Soft, non-tender, non-distended with normal bowel sounds  Musculoskelatal: ROM full with intact strength, no pain to palpation  Neurologic: Alert and oriented x3, Cranial nerves II-XII grossly intact, DTR normal and symmetric  Pulses: Radial, brachial, carotid, femoral, dorsal pedis, and posterior tibial pulses equal and symmetric    Diabetes Management Exam:    Foot Exam (with socks and/or shoes not present):       Sensory-Monofilament:          Left foot: normal          Right foot: normal   Impression & Recommendations:  Problem # 1:  DIABETES MELLITUS, TYPE II (ICD-250.00) MR. Carpio is doing better.  His A1C is down a full point.  We will continue to titrate his insulin up and have him monitor his blood sugar more intensively to get a better idea of his insulin needs because he likely will need meal coverage.  We will increase his Humulin today  His updated medication list for this problem includes:    Aspirin 81 Mg Tbec (Aspirin) .Marland Kitchen... Take 1 tablet by mouth once a day    Benazepril Hcl 40 Mg Tabs (Benazepril hcl) .Marland Kitchen... Take 2 tablets by mouth once a day    Glipizide 10 Mg Tabs (Glipizide) .Marland Kitchen... Take 1and half tablet by mouth twice a day    Humulin N 100 Unit/ml Susp (Insulin isophane human) ..... Inject 25 units at bedtime.  Orders: T- Capillary Blood Glucose [id number]) T-Hgb A1C (in-house) [id number])  Labs Reviewed: Creat: 1.93 (02/01/2010)     Last Eye Exam: No diabetic retinopathy.    (04/10/2010) Reviewed HgBA1c results: 10.6 (06/30/2010)  11.6  (01/10/2010)  Problem # 2:  HYPERTENSION (ICD-401.9) Mr. Aldis blood pressure is significantly elevated today.  He has been out of benazepril for 2-3 days.  He has difficult to control blood pressure even with the best medication compliance.  We will have him pick up a refill of his ACEI and come back in a week to watch his blood pressure.  He have a question of sleep apnea so it is likely that he needs a sleep eval and possible CPAP which may bring his blood pressure under control as well.   His updated medication list for this problem includes:    Benazepril Hcl 40 Mg Tabs (Benazepril hcl) .Marland Kitchen... Take 2 tablets by mouth once a day    Clonidine Hcl 0.3 Mg Tabs (Clonidine hcl) .Marland Kitchen... Take 1 tablet by mouth two times a day for blood pressure    Furosemide 80 Mg Tabs (Furosemide) .Marland Kitchen... Take 1 and half pill a day.    Cardizem 90 Mg Tabs (Diltiazem hcl) .Marland Kitchen..Marland Kitchen Two tabs twice a day  BP today: 199/110 Prior BP: 138/79 (  03/17/2010)  Labs Reviewed: K+: 4.0 (02/01/2010) Creat: : 1.93 (02/01/2010)   Chol: 137 (04/22/2009)   HDL: 38 (04/22/2009)   LDL: 62 (04/22/2009)   TG: 185 (04/22/2009)  Problem # 3:  HYPERLIPIDEMIA (ICD-272.4) His LDL is at goal on his most recent FLP so he wll need to be followed up in May. His updated medication list for this problem includes:    Pravachol 40 Mg Tabs (Pravastatin sodium) .Marland Kitchen... Take 2  tablets by mouth once a day  Labs Reviewed: SGOT: 27 (10/11/2009)   SGPT: 34 (10/11/2009)   HDL:38 (04/22/2009), 39 (06/09/2008)  LDL:62 (04/22/2009), 87 (06/09/2008)  Chol:137 (04/22/2009), 164 (06/09/2008)  Trig:185 (04/22/2009), 192 (06/09/2008)  Problem # 4:  ATRIAL FIBRILLATION, PAROXYSMAL, CHRONIC (ICD-427.31) He continues to follow with Dr. Cherene Julian for his coumadin therapy.  Last PT was low end of the target.  He will continue with anticoagulation therapy because of his paroxysmal A. fib.  His updated medication list for this problem includes:    Aspirin 81 Mg Tbec  (Aspirin) .Marland Kitchen... Take 1 tablet by mouth once a day    Warfarin Sodium 5 Mg Tabs (Warfarin sodium) .Marland Kitchen... Take as directed.    Cardizem 90 Mg Tabs (Diltiazem hcl) .Marland Kitchen..Marland Kitchen Two tabs twice a day  Reviewed the following: PT: 16.8 (09/14/2008)   INR: 2.0 (06/05/2010) Coumadin Dose (weekly): 42.5mg /week (06/05/2010) Prior Coumadin Dose (weekly): 42.5mg /week (06/05/2010) Next Protime: 07/03/2010 (dated on 06/05/2010)  Complete Medication List: 1)  Aspirin 81 Mg Tbec (Aspirin) .... Take 1 tablet by mouth once a day 2)  Benazepril Hcl 40 Mg Tabs (Benazepril hcl) .... Take 2 tablets by mouth once a day 3)  Glipizide 10 Mg Tabs (Glipizide) .... Take 1and half tablet by mouth twice a day 4)  Pravachol 40 Mg Tabs (Pravastatin sodium) .... Take 2  tablets by mouth once a day 5)  Famotidine 20 Mg Tabs (Famotidine) .... Take 2 tablets by mouth once a day 6)  Clonidine Hcl 0.3 Mg Tabs (Clonidine hcl) .... Take 1 tablet by mouth two times a day for blood pressure 7)  Furosemide 80 Mg Tabs (Furosemide) .... Take 1 and half pill a day. 8)  Truetrack Test Strp (Glucose blood) .... Use to test blood sugar 2-3 times daily 9)  Lancets 30g Misc (Lancets) .... Use to test blood glucose 2-3 times daily 10)  Warfarin Sodium 5 Mg Tabs (Warfarin sodium) .... Take as directed. 11)  Cardizem 90 Mg Tabs (Diltiazem hcl) .... Two tabs twice a day 12)  Fish Oil 1000 Mg Caps (Omega-3 fatty acids) .Marland Kitchen.. 1 capsule twice daily 13)  Humulin N 100 Unit/ml Susp (Insulin isophane human) .... Inject 25 units at bedtime. 14)  Elite-thin Insulin Syringe 31g X 5/16" 0.5 Ml Misc (Insulin syringe-needle u-100) .... Use to inject insulin daily (250) 15)  Relion Insulin Syringe 31g X 5/16" 0.5 Ml Misc (Insulin syringe-needle u-100) .... Use to inject relion humulin insulin once time a day  (250.00)  Patient Instructions: 1)  I refilled your medications for your blood pressure. 2)  Continue all your other medications as prescribed. 3)  Return  to for an appointment on Next Friday 07/07/10 with Dr. Obie Dredge for follow up on your blood pressure and to discuss a sleep study. 4)  If you notice any chest pain, dizziness, spots in your vision, abdominal pain, headache or shortness of breath please call. 5)  Check your blood sugar once daily alternating before breakfast, before lunch, before supper, and before bed time.  Make sure to bring your meter again to your appointments. 6)  We increased your insulin to 25 units at bed time Prescriptions: HUMULIN N 100 UNIT/ML SUSP (INSULIN ISOPHANE HUMAN) Inject 25 units at bedtime.  #5 vials x 6   Entered and Authorized by:   Trish Fountain MD   Signed by:   Trish Fountain MD on 06/30/2010   Method used:   Electronically to        [facility] (retail)       [phone]       [place], [place]  [address]       Ph: [phone]       Fax: [phone]   RxID:   [id number] BENAZEPRIL HCL 40 MG TABS (BENAZEPRIL HCL) Take 2 tablets by mouth once a day  #62 x 11   Entered and Authorized by:   Trish Fountain MD   Signed by:   Trish Fountain MD on 06/30/2010   Method used:   Electronically to        [facility] (retail)       [phone]       [place], [place]  [address]       Ph: [phone]       Fax: [phone]   RxID:   [id number]    Orders Added: 1)  T- Capillary Blood Glucose [82948] 2)  T-Hgb A1C (in-house) [[id number]] 3)  Est. Patient Level III [id number]    Prevention & Chronic Care Immunizations   Influenza vaccine: Fluvax 3+  (05/08/2010)   Influenza vaccine deferral: Not available  (02/01/2010)   Influenza vaccine due: 03/30/2010    Tetanus booster: Not documented   Td booster deferral: Refused  (06/30/2010)   Tetanus booster due: 07/14/2010    Pneumococcal vaccine: Pneumovax (Medicare)  (05/26/2009)    H. zoster vaccine: Not documented   H. zoster  vaccine deferral: Deferred  (01/10/2010)  Colorectal Screening   Hemoccult: Not documented   Hemoccult action/deferral: Not indicated  (03/17/2010)    Colonoscopy: Benign polyps.   (12/04/2007)   Colonoscopy due: 12/03/2012  Other Screening   PSA: Not documented   PSA action/deferral: Discussed-PSA declined  (06/30/2010)   Smoking status: quit  (06/30/2010)  Diabetes Mellitus   HgbA1C: 10.6  (06/30/2010)   Hemoglobin A1C due: 12/12/2008    Eye exam: No diabetic retinopathy.     (04/10/2010)   Diabetic eye exam action/deferral: Ophthalmology referral  (03/17/2010)   Eye exam due: 03/2011    Foot exam: yes  (06/30/2010)   Foot exam action/deferral: Do today   High risk foot: Yes  (06/30/2010)   Foot care education: Done  (06/30/2010)   Foot exam due: 09/29/2010    Urine microalbumin/creatinine ratio: 197.9  (03/17/2010)   Urine microalbumin action/deferral: Ordered   Urine microalbumin/cr due: 06/09/2009    Diabetes flowsheet reviewed?: Yes   Progress toward A1C goal: Improved  Lipids   Total Cholesterol: 137  (04/22/2009)   LDL: 62  (04/22/2009)   LDL Direct: Not documented   HDL: 38  (04/22/2009)   Triglycerides: 185  (04/22/2009)    SGOT (AST): 27  (10/11/2009)   SGPT (ALT): 34  (10/11/2009)   Alkaline phosphatase: 96  (10/11/2009)   Total bilirubin: 0.7  (10/11/2009)    Lipid flowsheet reviewed?: Yes   Progress toward LDL goal: At goal  Hypertension   Last Blood  Pressure: 199 / 110  (06/30/2010)   Serum creatinine: 1.93  (02/01/2010)   BMP action: Ordered   Serum potassium 4.0  (02/01/2010)    Hypertension flowsheet reviewed?: Yes   Progress toward BP goal: Deteriorated  Self-Management Support :   Personal Goals (by the next clinic visit) :     Personal A1C goal: 7  (06/27/2009)     Personal blood pressure goal: 130/80  (06/27/2009)     Personal LDL goal: 70  (06/27/2009)    Patient will work on the following items until the next clinic visit to  reach self-care goals:     Medications and monitoring: take my medicines every day, check my blood sugar, check my blood pressure, bring all of my medications to every visit, examine my feet every day  (06/30/2010)     Eating: drink diet soda or water instead of juice or soda, eat more vegetables, use fresh or frozen vegetables, eat foods that are low in salt, eat fruit for snacks and desserts, limit or avoid alcohol  (06/30/2010)     Activity: take a 30 minute walk every day, park at the far end of the parking lot  (06/30/2010)    Diabetes self-management support: Written self-care plan, Education handout, Resources for patients handout  (06/30/2010)   Diabetes care plan printed   Diabetes education handout printed   Last diabetes self-management training by diabetes educator: 02/24/2010   Last medical nutrition therapy: 10/12/2008    Hypertension self-management support: Written self-care plan, Education handout, Resources for patients handout  (06/30/2010)   Hypertension self-care plan printed.   Hypertension education handout printed    Lipid self-management support: Written self-care plan, Education handout, Resources for patients handout  (06/30/2010)   Lipid self-care plan printed.   Lipid education handout printed      Resource handout printed.   Last LDL:                                                 62 (04/22/2009 8:26:00 PM)        Diabetic Foot Exam Foot Inspection Is there a history of a foot ulcer?              No Is there a foot ulcer now?              No Can the patient see the bottom of their feet?          Yes Are the shoes appropriate in style and fit?          Yes Is there swelling or an abnormal foot shape?          No Are the toenails long?                No Are the toenails thick?                No Are the toenails ingrown?              No Is there heavy callous build-up?              No Is there a claw toe deformity?                          No Is  there elevated skin temperature?  No Is there limited ankle dorsiflexion?            No Is there foot or ankle muscle weakness?            No Do you have pain in calf while walking?           No      Diabetic Foot Care Education :Patient educated on appropriate care of diabetic feet.  Pulse Check          Right Foot          Left Foot Posterior Tibial:        1+            1+ Dorsalis Pedis:        1+            1+  High Risk Feet? Yes Set Next Diabetic Foot Exam here: 09/29/2010   10-g (5.07) Semmes-Weinstein Monofilament Test Performed by: Hilda Blades Ditzler RN          Right Foot          Left Foot Visual Inspection     normal         normal Test Control      normal         normal Site 1         normal         normal Site 2         normal         normal Site 3         normal         normal Site 4         normal         normal Site 5         normal         normal Site 6         normal         normal Site 7         normal         normal Site 8         normal         normal Site 9         normal         normal Site 10         normal         normal  Impression      normal         normal   Laboratory Results   Blood Tests   Date/Time Received: June 30, 2010 2:21 PM  Date/Time Reported: Lenoria Farrier  June 30, 2010 2:21 PM   HGBA1C: 10.6%   (Normal Range: Non-Diabetic - 3-6%   Control Diabetic - 6-8%) CBG Random:: 206mg /dL     Appended Document: FU VISIT/DS Please see the append for the glucose lab note on 06/30/2010.  Thank You.

## 2010-08-29 NOTE — Assessment & Plan Note (Signed)
Summary: COU/CH  Anticoagulant Therapy Managed by: [name]. [name]  PharmD CACP Referring MD: Obie Dredge PCP: Trish Fountain MD [facility] Attending: Lynnae January MD, [place] Indication 1: Atrial fibrillation Indication 2: Aftercare long term use Anticoagulants V58.61,V58.83 Start date: 10/09/2006 Duration: Indefinite  Patient Assessment Reviewed by: [name] PharmD  June 05, 2010 Medication review: verified warfarin dosage & schedule,verified previous prescription medications, verified doses & any changes, verified new medications, reviewed OTC medications, reviewed OTC health products-vitamins supplements etc Complications: none Dietary changes: none   Health status changes: none   Lifestyle changes: none   Recent/future hospitalizations: none   Recent/future procedures: none   Recent/future dental: none Patient Assessment Part 2:  Have you MISSED ANY DOSES or CHANGED TABLETS?  No missed Warfarin doses or changed tablets.  Have you had any BRUISING or BLEEDING ( nose or gum bleeds,blood in urine or stool)?  No reported bruising or bleeding in nose, gums, urine, stool.  Have you STARTED or STOPPED any MEDICATIONS, including OTC meds,herbals or supplements?  No other medications or herbal supplements were started or stopped.  Have you CHANGED your DIET, especially green vegetables,or ALCOHOL intake?  No changes in diet or alcohol intake.  Have you had any ILLNESSES or HOSPITALIZATIONS?  No reported illnesses or hospitalizations  Have you had any signs of CLOTTING?(chest discomfort,dizziness,shortness of breath,arms tingling,slurred speech,swelling or redness in leg)    No chest discomfort, dizziness, shortness of breath, tingling in arm, slurred speech, swelling, or redness in leg.     Treatment  Target INR: 2.0-3.0 INR: 2.0  Date: 06/05/2010 Regimen In:  35.0mg /week INR reflects regimen in: 2.0  New  Tablet strength: : 5mg  Regimen Out:     Sunday: 1 Tablet  Monday: 1 & 1/2 Tablet     Tuesday: 1 Tablet     Wednesday: 1 & 1/2 Tablet     Thursday: 1 Tablet      Friday: 1 & 1/2 Tablet     Saturday: 1 Tablet Total Weekly: 42.5mg/week mg  Next INR Due: 07/03/2010 Adjusted by: James B. Groce III PharmD CACP   Return to anticoagulation clinic:  07/03/2010 Time of next visit: 1400    Allergies: No Known Drug Allergies Prescriptions: WARFARIN SODIUM 5 MG TABS (WARFARIN SODIUM) Take as directed.  #50 x 2   Entered by:   Jay Groce PharmD   Authorized by:   Elizabeth Butcher MD   Signed by:   Jay Groce PharmD on 06/05/2010   Method used:   Electronically to        [facility] [address] (retail)       [phone]       [place], [place]  [address]       Ph: [phone]       Fax: [phone]   RxID:   [id number]

## 2010-08-29 NOTE — Assessment & Plan Note (Signed)
Summary: Patrick [name] teaching/ch  This is a late entry for DSMT visit on 02/24/10.  Allergies: No Known Drug Allergies   Complete Medication List: 1)  Aspirin 81 Mg Tbec (Aspirin) .... Take 1 tablet by mouth once a day 2)  Benazepril Hcl 40 Mg Tabs (Benazepril hcl) .... Take 2 tablets by mouth once a day 3)  Glipizide 10 Mg Tabs (Glipizide) .... Take 1and half tablet by mouth twice a day 4)  Pravachol 40 Mg Tabs (Pravastatin sodium) .... Take 2  tablets by mouth once a day 5)  Famotidine 20 Mg Tabs (Famotidine) .... Take 2 tablets by mouth once a day 6)  Clonidine Hcl 0.3 Mg Tabs (Clonidine hcl) .... Take 1 tablet by mouth two times a day for blood pressure 7)  Furosemide 80 Mg Tabs (Furosemide) .... Take 1 and half pill a day. 8)  Truetrack Test Strp (Glucose blood) .... Use to test blood sugar 2-3 times daily 9)  Lancets 30g Misc (Lancets) .... Use to test blood glucose 2-3 times daily 10)  Warfarin Sodium 5 Mg Tabs (Warfarin sodium) .... Take as directed. 11)  Cardizem 90 Mg Tabs (Diltiazem hcl) .... Two tabs twice a day 12)  Fish Oil 1000 Mg Caps (Omega-3 fatty acids) .Marland Kitchen.. 1 capsule twice daily 13)  Humulin N 100 Unit/ml Susp (Insulin isophane human) .... Inject 15 units at bedtime. 14)  Elite-thin Insulin Syringe 31g X 5/16" 0.5 Ml Misc (Insulin syringe-needle u-100) .... Use to inject insulin daily (250)  Other Orders: DSMT(Medicare) Individual, 30 Minutes [id number])  Diabetes Self Management Training  PCP: Trish Fountain MD Referring MD: Obie Dredge Date diagnosed with diabetes: 07/30/2005 Diabetes Type: Type 2 insulin Other persons present: no Current smoking Status: quit  Diabetes Medications:  Lipid lowering Meds? Yes Anti-platelet Meds? Yes Herbs or Supplements: Yes Comments: Here to learn about and begin using insulin for his diabetes. Says he cannot afford the lantus at this time because he doesnot have Medicare D prescription drug coverage. Says he can afford $25.00 a vial  for [facility] Humulin N insulin whihc can be used as a basal insulin at bedtime. Patient successfully gave himself na dinjection of saline today followinbg correct procedures for insulin drawing up and adminstering that was demonstrated to him. Discussed financial situation with Dr. Obie Dredge who agreed t have patient start humulin N rather than lantus.      Diabetes Disease Process  Discussed today Define diabetes in simple terms: Needs review/assistance    Medications State name-action-dose-duration-side effects-and time to take medication: Demonstrates competency   State appropriate timing of food related to medication: Demonstrates competency   Demonstrates/verbalizes site selection and rotation for injections Demonstrates competency   Correctly draw up and administer insulin-Byetta-Symlin-glucagon: Demonstrates competency   Describe safe needle/lancet disposal: Chiropractor    Nutritional Management  Monitoring State purpose and frequency of monitoring BG-ketones-HgbA1C  : Needs review/assistance   Perform glucose monitoring/ketone testing and record results correctly: Demonstrates competencyState target blood glucose and HgbA1C goals: Needs review/assistance    Complications State the causes- signs and symptoms and prevention of hypoglycemia: Demonstrates competency   Explain proper treatment of hypoglycemia: Demonstrates competency    Exercise Diabetes Management Education Done: 02/24/2010    BEHAVIORAL GOALS INITIAL Utilizing medications if for therapeutic effectiveness: begin injecting 10 units Humulin N at bedtime as soon as you purchase it Monitoring blood glucose levels daily: test blood sugar every morning to monitor the effectiveness of the insulin        Diabetes Self  Management Support: wife, CDE and office staff Follow up: by phone in 1 week , then in office 1 month if needed

## 2010-08-29 NOTE — Assessment & Plan Note (Signed)
Summary: COU/CH  Anticoagulant Therapy Managed by: [name]. [name]  PharmD CACP Referring MD: Dawna Part MD PCP: Dawna Part MD [facility] Attending: Bertha Stakes MD Indication 1: Atrial fibrillation Indication 2: Aftercare long term use Anticoagulants V58.61,V58.83 Start date: 10/09/2006 Duration: Indefinite  Patient Assessment Reviewed by: [name] PharmD  September 05, 2009 Medication review: verified warfarin dosage & schedule,verified previous prescription medications, verified doses & any changes, verified new medications, reviewed OTC medications, reviewed OTC health products-vitamins supplements etc Complications: none Dietary changes: none   Health status changes: none   Lifestyle changes: none   Recent/future hospitalizations: none   Recent/future procedures: none   Recent/future dental: none Patient Assessment Part 2:  Have you MISSED ANY DOSES or CHANGED TABLETS?  No missed Warfarin doses or changed tablets.  Have you had any BRUISING or BLEEDING ( nose or gum bleeds,blood in urine or stool)?  No reported bruising or bleeding in nose, gums, urine, stool.  Have you STARTED or STOPPED any MEDICATIONS, including OTC meds,herbals or supplements?  No other medications or herbal supplements were started or stopped.  Have you CHANGED your DIET, especially green vegetables,or ALCOHOL intake?  No changes in diet or alcohol intake.  Have you had any ILLNESSES or HOSPITALIZATIONS?  No reported illnesses or hospitalizations  Have you had any signs of CLOTTING?(chest discomfort,dizziness,shortness of breath,arms tingling,slurred speech,swelling or redness in leg)    No chest discomfort, dizziness, shortness of breath, tingling in arm, slurred speech, swelling, or redness in leg.     Treatment  Target INR: 2.0-3.0 INR: 3.1  Date: 09/05/2009 Regimen In:  32.5mg /week INR reflects regimen in: 3.1  New  Tablet strength: : 5mg  Regimen Out:     Sunday: 1 Tablet  Monday: 1 Tablet     Tuesday: 1 Tablet     Wednesday: 1/2 Tablet     Thursday: 1 Tablet      Friday: 1 Tablet     Saturday: 1 Tablet Total Weekly: 32.5mg/week mg  Next INR Due: 10/03/2009 Adjusted by: Teela Narducci B. Teiara Baria III PharmD CACP   Return to anticoagulation clinic:  10/03/2009 Time of next visit: 1445    Allergies: No Known Drug Allergies Prescriptions: WARFARIN SODIUM 5 MG TABS (WARFARIN SODIUM) Take as directed.  #31 Each x 2   Entered by:   Jay Lauralye Kinn PharmD   Authorized by:   Jerry Joines MD   Signed by:   Jay Nalina Yeatman PharmD on 09/05/2009   Method used:   Electronically to        [facility] [address] (retail)       [address]       [place], [place]  [address]       Ph: [phone]       Fax: [phone]   RxID:   [id number]

## 2010-08-29 NOTE — Progress Notes (Signed)
Summary: clarification on insulin & syringe prescription/dmr  Phone Note Call from Patient Call back at Home Phone [phone]   Caller: Patient Summary of Call: [facility] called to say [facility] told 220.00- cannot afford - so left with nothing.    CDE called [facility]:  they had order for lantus and Humulin N- I told htem to cancel the lantus prescription, gave telephone order for syringes per their request and verified price of ReliON Humulin N Insulin- 24.88 per vial. syringes are 12.88 per 100.    Initial call taken by: Barnabas Harries RD,CDE,  March 02, 2010 2:20 PM  Follow-up for Phone Call        called patient to tell him cost and what to pick up in regards to insulin and syringes- he asked how long they'd last:  reminded him of syringe reuse guidelines that we had gone over and reminded him that although 1 vial insulin will last > 30 days, it should be discarded after 30 days.   Follow-up by: Barnabas Harries RD,CDE,  March 02, 2010 4:55 PM  Additional Follow-up for Phone Call Additional follow up Details #1::        okay will assess at followup    New/Updated Medications: RELION INSULIN SYRINGE 31G X 5/16" 0.5 ML MISC (INSULIN SYRINGE-NEEDLE U-100) use to inject ReliOn Humulin insulin once time a day  (250.00) Prescriptions: RELION INSULIN SYRINGE 31G X 5/16" 0.5 ML MISC (INSULIN SYRINGE-NEEDLE U-100) use to inject ReliOn Humulin insulin once time a day  (250.00)  #100 x 11   Entered by:   Barnabas Harries RD,CDE   Authorized by:   Trish Fountain MD   Signed by:   Trish Fountain MD on 03/03/2010   Method used:   Telephoned to ...       [facility] (retail)       [phone]       [place], [place]  [address]       Ph: [phone]       Fax: [phone]   RxID:   [id number]

## 2010-08-31 NOTE — Assessment & Plan Note (Signed)
Summary: COU/CH  Anticoagulant Therapy Managed by: [name]. Patrick [name]  PharmD CACP Referring MD:  Ezequiel Kayser MD PCP: Trish Fountain MD [facility] Attending: Verneda Skill MD Indication 1: Atrial fibrillation Indication 2: Aftercare long term use Anticoagulants V58.61,V58.83 Start date: 10/09/2006 Duration: Indefinite  Patient Assessment Reviewed by: [name] PharmD  July 31, 2010 Medication review: verified warfarin dosage & schedule,verified previous prescription medications, verified doses & any changes, verified new medications, reviewed OTC medications, reviewed OTC health products-vitamins supplements etc Complications: none Dietary changes: none   Health status changes: none   Lifestyle changes: none   Recent/future hospitalizations: none   Recent/future procedures: none   Recent/future dental: none Patient Assessment Part 2:  Have you MISSED ANY DOSES or CHANGED TABLETS?  No missed Warfarin doses or changed tablets.  Have you had any BRUISING or BLEEDING ( nose or gum bleeds,blood in urine or stool)?  No reported bruising or bleeding in nose, gums, urine, stool.  Have you STARTED or STOPPED any MEDICATIONS, including OTC meds,herbals or supplements?  No other medications or herbal supplements were started or stopped.  Have you CHANGED your DIET, especially green vegetables,or ALCOHOL intake?  No changes in diet or alcohol intake.  Have you had any ILLNESSES or HOSPITALIZATIONS?  No reported illnesses or hospitalizations  Have you had any signs of CLOTTING?(chest discomfort,dizziness,shortness of breath,arms tingling,slurred speech,swelling or redness in leg)    No chest discomfort, dizziness, shortness of breath, tingling in arm, slurred speech, swelling, or redness in leg.     Treatment  Target INR: 2.0-3.0 INR: 2.4  Date: 07/31/2010 Regimen In:  37.5mg /week INR reflects regimen in: 2.4  New  Tablet strength: : 5mg  Regimen Out:     Sunday: 1  Tablet     Monday: 1 Tablet     Tuesday: 1 Tablet     Wednesday: 1 & 1/2 Tablet     Thursday: 1 Tablet      Friday: 1 Tablet     Saturday: 1 Tablet Total Weekly: 37.5mg /week mg  Next INR Due: 08/28/2010 Adjusted by: [name]. Elie Confer III PharmD CACP   Return to anticoagulation clinic:  08/28/2010 Time of next visit: 1400    Allergies: No Known Drug Allergies

## 2010-08-31 NOTE — Assessment & Plan Note (Signed)
Summary: 261/ds  Anticoagulant Therapy Managed by: Patrick [name]. Patrick [name]  PharmD CACP Referring MD:  Ezequiel Kayser MD PCP: Trish Fountain MD [facility] Attending: Bertha Stakes MD Indication 1: Atrial fibrillation Indication 2: Aftercare long term use Anticoagulants V58.61,V58.83 Start date: 10/09/2006 Duration: Indefinite  Patient Assessment Reviewed by: Patrick [name] PharmD  July 10, 2010 Medication review: verified warfarin dosage & schedule,verified previous prescription medications, verified doses & any changes, verified new medications, reviewed OTC medications, reviewed OTC health products-vitamins supplements etc Complications: none Dietary changes: none   Health status changes: none   Lifestyle changes: none   Recent/future hospitalizations: none   Recent/future procedures: none   Recent/future dental: none Patient Assessment Part 2:  Have you MISSED ANY DOSES or CHANGED TABLETS?  No missed Warfarin doses or changed tablets.  Have you had any BRUISING or BLEEDING ( nose or gum bleeds,blood in urine or stool)?  No reported bruising or bleeding in nose, gums, urine, stool.  Have you STARTED or STOPPED any MEDICATIONS, including OTC meds,herbals or supplements?  No other medications or herbal supplements were started or stopped.  Have you CHANGED your DIET, especially green vegetables,or ALCOHOL intake?  No changes in diet or alcohol intake.  Have you had any ILLNESSES or HOSPITALIZATIONS?  No reported illnesses or hospitalizations  Have you had any signs of CLOTTING?(chest discomfort,dizziness,shortness of breath,arms tingling,slurred speech,swelling or redness in leg)    No chest discomfort, dizziness, shortness of breath, tingling in arm, slurred speech, swelling, or redness in leg.     Treatment  Target INR: 2.0-3.0 INR: 4.4  Date: 07/10/2010 Regimen In:  42.5mg /week INR reflects regimen in: 4.4  New  Tablet strength: : 5mg  Regimen Out:     Sunday: 1 Tablet     Monday: 1 Tablet     Tuesday: 1 Tablet     Wednesday: 1 & 1/2 Tablet     Thursday: 1 Tablet      Friday: 1 Tablet     Saturday: 1 Tablet Total Weekly: 37.5mg /week mg  Next INR Due: 07/31/2010 Adjusted by: Patrick [name]. Patrick [name] PharmD CACP   Return to anticoagulation clinic:  07/31/2010  Hold:  2 Days     Allergies: No Known Drug Allergies

## 2010-08-31 NOTE — Progress Notes (Signed)
Summary: Refill/gh  Phone Note Refill Request Message from:  Patient on July 25, 2010 4:31 PM  Refills Requested: Medication #1:  Prednisone Taper pack Call from pt requesting medication for his Gout.  Pt said that he is having pain in his foot and wants something. Foot is real sore.   Last visit was 07/07/2010.  Pt says that he has occassional flare ups.  Wants something today if possible.   Method Requested: Electronic Initial call taken by: Sander Nephew RN,  July 25, 2010 4:35 PM  Follow-up for Phone Call        ok willsend in another taper  Follow-up by: Rhea Pink  DO,  July 25, 2010 5:07 PM    New/Updated Medications: PREDNISONE 20 MG TABS (PREDNISONE) Take 2 tabs by mouth for 5 days, take one tab by mouth for 5 days then stop Prescriptions: PREDNISONE 20 MG TABS (PREDNISONE) Take 2 tabs by mouth for 5 days, take one tab by mouth for 5 days then stop  #15 x 0   Entered and Authorized by:   Rhea Pink  DO   Signed by:   Rhea Pink  DO on 07/25/2010   Method used:   Electronically to        [facility] (retail)       [phone]       [place], [place]  [address]       Ph: [phone]       Fax: [phone]   RxID:   [id number]

## 2010-08-31 NOTE — Letter (Signed)
Summary: BLOOD GLUCOSE   BLOOD GLUCOSE   Imported By: Garlan Fillers 07/11/2010 14:51:10  _____________________________________________________________________  External Attachment:    Type:   Image     Comment:   External Document

## 2010-09-06 NOTE — Assessment & Plan Note (Signed)
Summary: COU/CH  Anticoagulant Therapy Managed by: [name]. [name]  PharmD CACP Referring MD:  Ezequiel Kayser MD PCP: Trish Fountain MD [facility] Attending: Verneda Skill MD Indication 1: Atrial fibrillation Indication 2: Aftercare long term use Anticoagulants V58.61,V58.83 Start date: 10/09/2006 Duration: Indefinite  Patient Assessment Reviewed by: [name] PharmD  August 28, 2010 Medication review: verified warfarin dosage & schedule,verified previous prescription medications, verified doses & any changes, verified new medications, reviewed OTC medications, reviewed OTC health products-vitamins supplements etc Complications: none Dietary changes: none   Health status changes: none   Lifestyle changes: none   Recent/future hospitalizations: none   Recent/future procedures: none   Recent/future dental: none Patient Assessment Part 2:  Have you MISSED ANY DOSES or CHANGED TABLETS?  No missed Warfarin doses or changed tablets.  Have you had any BRUISING or BLEEDING ( nose or gum bleeds,blood in urine or stool)?  No reported bruising or bleeding in nose, gums, urine, stool.  Have you STARTED or STOPPED any MEDICATIONS, including OTC meds,herbals or supplements?  No other medications or herbal supplements were started or stopped.  Have you CHANGED your DIET, especially green vegetables,or ALCOHOL intake?  No changes in diet or alcohol intake.  Have you had any ILLNESSES or HOSPITALIZATIONS?  No reported illnesses or hospitalizations  Have you had any signs of CLOTTING?(chest discomfort,dizziness,shortness of breath,arms tingling,slurred speech,swelling or redness in leg)    No chest discomfort, dizziness, shortness of breath, tingling in arm, slurred speech, swelling, or redness in leg.     Treatment  Target INR: 2.0-3.0 INR: 2.8  Date: 08/28/2010 Regimen In:  37.5mg /week INR reflects regimen in: 2.8  New  Tablet strength: : 5mg  Regimen Out:     Sunday: 1  Tablet     Monday: 1 Tablet     Tuesday: 1 Tablet     Wednesday: 1 & 1/2 Tablet     Thursday: 1 Tablet      Friday: 1 Tablet     Saturday: 1 Tablet Total Weekly: 37.5mg /week mg  Next INR Due: 09/25/2010 Adjusted by: [name]. Elie Confer III PharmD CACP   Return to anticoagulation clinic:  09/25/2010 Time of next visit: 1500    Allergies: No Known Drug Allergies

## 2010-09-25 ENCOUNTER — Ambulatory Visit (INDEPENDENT_AMBULATORY_CARE_PROVIDER_SITE_OTHER): Payer: Medicare Other | Admitting: Pharmacist

## 2010-09-25 DIAGNOSIS — I4891 Unspecified atrial fibrillation: Secondary | ICD-10-CM

## 2010-09-25 DIAGNOSIS — Z7901 Long term (current) use of anticoagulants: Secondary | ICD-10-CM

## 2010-09-25 NOTE — Patient Instructions (Signed)
Patient instructed to take medications as defined in the Anti-coagulation Track section of this encounter.  Patient instructed to take today's dose.  Patient verbalized understanding of these instructions.    

## 2010-09-25 NOTE — Progress Notes (Signed)
Anti-Coagulation Progress Note  Patrick [name] is a 65 y.o. male who is currently on an anti-coagulation regimen.    RECENT RESULTS: Recent results are below, the most recent result is correlated with a dose of 37.5 mg. per week: Lab Results  Component Value Date   INR 2.7 09/25/2010   INR 2.8 08/28/2010   INR 2.4 07/31/2010    ANTI-COAG DOSE:   Latest dosing instructions   Total Sun Mon Tue Wed Thu Fri Sat   37.5 5 mg 5 mg 5 mg 7.5 mg 5 mg 5 mg 5 mg    (5 mg1) (5 mg1) (5 mg1) (5 mg1.5) (5 mg1) (5 mg1) (5 mg1)         ANTICOAG SUMMARY: Anticoagulation Episode Summary              Current INR goal 2.0-3.0 Next INR check 10/23/2010   INR from last check 2.7 (09/25/2010)     Weekly max dose (mg)  Target end date    Indications ATRIAL FIBRILLATION, PAROXYSMAL, CHRONIC, Long term current use of anticoagulant   INR check location  Preferred lab    Send INR reminders to [facility]   Comments        Provider Role Specialty Phone number   Larey Dresser  Internal Medicine [phone]        ANTICOAG TODAY: Anticoagulation Summary as of 09/25/2010              INR goal 2.0-3.0     Selected INR 2.7 (09/25/2010) Next INR check 10/23/2010   Weekly max dose (mg)  Target end date    Indications ATRIAL FIBRILLATION, PAROXYSMAL, CHRONIC, Long term current use of anticoagulant    Anticoagulation Episode Summary              INR check location  Preferred lab    Send INR reminders to [facility]   Comments        Provider Role Specialty Phone number   Larey Dresser  Internal Medicine [phone]        PATIENT INSTRUCTIONS: Patient Instructions  Patient instructed to take medications as defined in the Anti-coagulation Track section of this encounter.  Patient instructed to take today's dose.  Patient verbalized understanding of these instructions.        FOLLOW-UP Return in 4 weeks (on 10/23/2010) for Follow up INR.  [name], III Pharm.D., CACP

## 2010-10-05 ENCOUNTER — Other Ambulatory Visit: Payer: Self-pay | Admitting: *Deleted

## 2010-10-05 DIAGNOSIS — K219 Gastro-esophageal reflux disease without esophagitis: Secondary | ICD-10-CM

## 2010-10-06 MED ORDER — FAMOTIDINE 20 MG PO TABS: 40.0000 mg | ORAL_TABLET | Freq: Every day | ORAL | Status: DC

## 2010-10-09 LAB — GLUCOSE, CAPILLARY: Glucose-Capillary: 324 mg/dL — ABNORMAL HIGH (ref 70–99)

## 2010-10-12 LAB — GLUCOSE, CAPILLARY: Glucose-Capillary: 178 mg/dL — ABNORMAL HIGH (ref 70–99)

## 2010-10-14 LAB — GLUCOSE, CAPILLARY: Glucose-Capillary: 271 mg/dL — ABNORMAL HIGH (ref 70–99)

## 2010-10-15 LAB — GLUCOSE, CAPILLARY: Glucose-Capillary: 305 mg/dL — ABNORMAL HIGH (ref 70–99)

## 2010-10-16 LAB — GLUCOSE, CAPILLARY: Glucose-Capillary: 317 mg/dL — ABNORMAL HIGH (ref 70–99)

## 2010-10-20 ENCOUNTER — Other Ambulatory Visit (INDEPENDENT_AMBULATORY_CARE_PROVIDER_SITE_OTHER): Payer: Medicare Other | Admitting: *Deleted

## 2010-10-20 DIAGNOSIS — E119 Type 2 diabetes mellitus without complications: Secondary | ICD-10-CM

## 2010-10-20 MED ORDER — FUROSEMIDE 80 MG PO TABS: 120.0000 mg | ORAL_TABLET | Freq: Every day | ORAL | Status: DC

## 2010-10-20 NOTE — Telephone Encounter (Signed)
Approved.  Will see him at his follow up next Friday!

## 2010-10-23 ENCOUNTER — Ambulatory Visit (INDEPENDENT_AMBULATORY_CARE_PROVIDER_SITE_OTHER): Payer: Medicare Other | Admitting: Pharmacist

## 2010-10-23 DIAGNOSIS — I4891 Unspecified atrial fibrillation: Secondary | ICD-10-CM

## 2010-10-23 DIAGNOSIS — Z7901 Long term (current) use of anticoagulants: Secondary | ICD-10-CM

## 2010-10-23 NOTE — Patient Instructions (Signed)
Patient instructed to take medications as defined in the Anti-coagulation Track section of this encounter.  Patient instructed to take today's dose.  Patient verbalized understanding of these instructions.    

## 2010-10-23 NOTE — Progress Notes (Signed)
Anti-Coagulation Progress Note  Patrick [name] is a 65 y.o. male who is currently on an anti-coagulation regimen.    RECENT RESULTS: Recent results are below, the most recent result is correlated with a dose of 37.5 mg. per week: Lab Results  Component Value Date   INR 2.5 10/23/2010   INR 2.7 09/25/2010   INR 2.8 08/28/2010    ANTI-COAG DOSE:   Latest dosing instructions   Total Sun Mon Tue Wed Thu Fri Sat   37.5 5 mg 5 mg 5 mg 7.5 mg 5 mg 5 mg 5 mg    (5 mg1) (5 mg1) (5 mg1) (5 mg1.5) (5 mg1) (5 mg1) (5 mg1)         ANTICOAG SUMMARY: Anticoagulation Episode Summary              Current INR goal 2.0-3.0 Next INR check 11/20/2010   INR from last check 2.5 (10/23/2010)     Weekly max dose (mg)  Target end date Indefinite   Indications ATRIAL FIBRILLATION, PAROXYSMAL, CHRONIC, Long term current use of anticoagulant   INR check location  Preferred lab    Send INR reminders to [facility]   Comments        Provider Role Specialty Phone number   Larey Dresser  Internal Medicine [phone]        ANTICOAG TODAY: Anticoagulation Summary as of 10/23/2010              INR goal 2.0-3.0     Selected INR 2.5 (10/23/2010) Next INR check 11/20/2010   Weekly max dose (mg)  Target end date Indefinite   Indications ATRIAL FIBRILLATION, PAROXYSMAL, CHRONIC, Long term current use of anticoagulant    Anticoagulation Episode Summary              INR check location  Preferred lab    Send INR reminders to [facility]   Comments        Provider Role Specialty Phone number   Larey Dresser  Internal Medicine [phone]        PATIENT INSTRUCTIONS: Patient Instructions  Patient instructed to take medications as defined in the Anti-coagulation Track section of this encounter.  Patient instructed to take today's dose.  Patient verbalized understanding of these instructions.        FOLLOW-UP Return in 4 weeks (on 11/20/2010) for Follow up INR.  [name],  III Pharm.D., CACP

## 2010-10-26 ENCOUNTER — Other Ambulatory Visit (INDEPENDENT_AMBULATORY_CARE_PROVIDER_SITE_OTHER): Payer: Medicare Other | Admitting: *Deleted

## 2010-10-26 DIAGNOSIS — E119 Type 2 diabetes mellitus without complications: Secondary | ICD-10-CM

## 2010-10-26 MED ORDER — ACCU-CHEK MULTICLIX LANCETS MISC: Status: DC

## 2010-10-26 MED ORDER — ACCU-CHEK AVIVA PLUS W/DEVICE KIT: 1.0000 | PACK | Status: DC | PRN

## 2010-10-26 MED ORDER — GLUCOSE BLOOD VI STRP: ORAL_STRIP | Status: DC

## 2010-10-26 NOTE — Telephone Encounter (Signed)
Pharmacy needs to know how often pt is testing and how often he is injecting his insulin.

## 2010-10-27 ENCOUNTER — Ambulatory Visit (INDEPENDENT_AMBULATORY_CARE_PROVIDER_SITE_OTHER): Payer: Medicare Other | Admitting: Internal Medicine

## 2010-10-27 ENCOUNTER — Encounter: Payer: Self-pay | Admitting: Internal Medicine

## 2010-10-27 VITALS — BP 134/83 | HR 53 | Temp 98.4°F | Ht 68.5 in | Wt 230.2 lb

## 2010-10-27 DIAGNOSIS — I4891 Unspecified atrial fibrillation: Secondary | ICD-10-CM

## 2010-10-27 DIAGNOSIS — E119 Type 2 diabetes mellitus without complications: Secondary | ICD-10-CM

## 2010-10-27 DIAGNOSIS — I10 Essential (primary) hypertension: Secondary | ICD-10-CM

## 2010-10-27 DIAGNOSIS — E785 Hyperlipidemia, unspecified: Secondary | ICD-10-CM

## 2010-10-27 DIAGNOSIS — G4730 Sleep apnea, unspecified: Secondary | ICD-10-CM

## 2010-10-27 LAB — COMPREHENSIVE METABOLIC PANEL
Alkaline Phosphatase: 92 U/L (ref 39–117)
BUN: 22 mg/dL (ref 6–23)
Creat: 1.85 mg/dL — ABNORMAL HIGH (ref 0.40–1.50)
Glucose, Bld: 247 mg/dL — ABNORMAL HIGH (ref 70–99)
Total Bilirubin: 0.5 mg/dL (ref 0.3–1.2)

## 2010-10-27 LAB — LIPID PANEL
HDL: 34 mg/dL — ABNORMAL LOW (ref >39)
Total CHOL/HDL Ratio: 4.5 Ratio
VLDL: 32 mg/dL (ref 0–40)

## 2010-10-27 LAB — GLUCOSE, CAPILLARY: Glucose-Capillary: 276 mg/dL — ABNORMAL HIGH (ref 70–99)

## 2010-10-27 MED ORDER — INSULIN NPH (HUMAN) (ISOPHANE) 100 UNIT/ML ~~LOC~~ SUSP: 25.0000 [IU] | Freq: Every day | SUBCUTANEOUS | Status: DC

## 2010-10-27 MED ORDER — PRAVASTATIN SODIUM 40 MG PO TABS: 80.0000 mg | ORAL_TABLET | Freq: Every day | ORAL | Status: DC

## 2010-10-27 NOTE — Telephone Encounter (Signed)
The patient should be testing his blood sugars twice daily and injecting insulin twice daily since he is on NPH.    Thanks!

## 2010-10-27 NOTE — Patient Instructions (Signed)
Stop in the laboratory and get your blood drawn and collect a urine specimen for testing. If there is anything we need to discuss I will call you with the results. Think about the CPAP.  It can help you manage many different chronic problems like sleep problems, diabetes, and most of all the health of your heart. Follow up with me in 3 months.  CPAP and BIPAP CPAP and BIPAP are methods of helping you breathe. CPAP stands for "continuous positive airway pressure." BIPAP stands for "bi-level positive airway pressure." Both CPAP and BIPAP are provided by a small machine with a flexible plastic tube that attaches to a plastic mask that goes over your nose or mouth. Air is blown into your air passages through your nose or mouth. This helps to keep your airways open and helps to keep you breathing well. The amount of pressure that is used to blow the air into your air passages can be set on the machine. The pressure setting is based on your needs. With CPAP, the amount of pressure stays the same while you breathe in and out. With BIPAP, the amount of pressure changes when you inhale and exhale. Your caregiver will recommend whether CPAP or BIPAP would be more helpful for you.  CPAP and BIPAP can be helpful for both adults and children with:  Sleep apnea.   Chronic Obstructive Pulmonary Disease (COPD), a condition like emphysema.   Diseases which weaken the muscles of the chest such as muscular dystrophy or neurological diseases.   Other problems that cause breathing to be weak or difficult.  USE OF CPAP OR BIPAP The respiratory therapist or technician will help you get used to wearing the mask. Some people feel claustrophobic (a trapped or closed in feeling) at first, because the mask needs to be fairly snug on your face.   It may help you to get used to the mask gradually, by first holding the mask loosely over your nose or mouth using a low pressure setting on the machine. Gradually the mask can be  applied more snugly with increased pressure. You can also gradually increase the amount of time the mask is used.   People with sleep apnea will use the mask and machine at night when they are sleeping. Others, like those with ALS or other breathing difficulties, may need the CPAP or BIPAP all the time.   If the first mask you try does not fit well, or is uncomfortable, there are other types and sizes that can be tried.   If you tend to breathe through your mouth, a chin strap may be applied to help keep your mouth closed (if you are using a nasal mask).   The CPAP and BIPAP machines have alarms that may sound if the mask comes off or develops a leak.   You should not eat or drink while the CPAP or BIPAP is on. Food or fluids could get pushed into your lungs by the pressure of the CPAP or BIPAP.  Sometimes CPAP or BIPAP machines are ordered for home use. If you are going to use the CPAP or BIPAP machine at home, follow these instructions  CPAP or BIPAP machines can be rented or purchased through home health care companies. There are many different brands of machines available. If you rent a machine before purchasing you may find which particular machine works well for you.   Ask questions if there is something you do not understand when picking out your machine.  Place your CPAP or BIPAP machine on a secure table or stand near an electrical outlet.   Know where the On/Off switch is.   Follow your doctor's instructions for how to set the pressure on your machine and when you should use it.   Do not smoke! Tobacco smoke residue can damage the machine.  SEEK IMMEDIATE MEDICAL CARE IF:  You have redness or open areas around your nose or mouth.   You have trouble operating the CPAP or BIPAP machine.   You cannot tolerate wearing the CPAP or BIPAP mask.   You have any questions or concerns.  Document Released: 04/13/2004 Document Re-Released: 10/12/2008 [facility] Patient Information  2011 [place].

## 2010-10-27 NOTE — Progress Notes (Signed)
  Subjective:    Patient ID: Patrick [name], male    DOB: 1945-08-27, 65 y.o.   MRN: [id number]  HPI  Patient is a 65 year old man who presents to clinic today for follow up of his chronic medical conditions including diabetes, hypertension, and hyperlipidemia.  He states that he has been taking his medications as prescribed and has no problems with side effects.  He has not had any hypoglycemic events.  He also needs refills of his medications and routine lab testing to follow his chronic medical conditions.   Review of Systems    Constitutional: Denies fever, chills, diaphoresis, appetite change and fatigue.  HEENT: Denies photophobia, eye pain, redness, hearing loss, ear pain, congestion, sore throat, rhinorrhea, sneezing, mouth sores, trouble swallowing, neck pain, neck stiffness and tinnitus.   Respiratory: Denies SOB, DOE, cough, chest tightness,  and wheezing.   Cardiovascular: Denies chest pain, palpitations and leg swelling.  Gastrointestinal: Denies nausea, vomiting, abdominal pain, diarrhea, constipation, blood in stool and abdominal distention.  Genitourinary: Denies dysuria, urgency, frequency, hematuria, flank pain and difficulty urinating.  Musculoskeletal: Denies myalgias, back pain, joint swelling, arthralgias and gait problem.  Skin: Denies pallor, rash and wound.  Neurological: Denies dizziness, seizures, syncope, weakness, light-headedness, numbness and headaches.  Hematological: Denies adenopathy. Easy bruising, personal or family bleeding history  Psychiatric/Behavioral: Denies suicidal ideation, mood changes, confusion, nervousness, sleep disturbance and agitation   Objective:   Physical Exam    Constitutional: Vital signs reviewed.  Patient is a well-developed and well-nourished man in no acute distress and cooperative with exam. Alert and oriented x3.  Head: Normocephalic and atraumatic Ear: TM normal bilaterally Mouth: no erythema or exudates, MMM Eyes: PERRL, EOMI,  conjunctivae normal, No scleral icterus.  Neck: Supple, Trachea midline normal ROM, No JVD, mass, thyromegaly, or carotid bruit present.  Cardiovascular: RRR, S1 normal, S2 normal, no MRG, pulses symmetric and intact bilaterally Pulmonary/Chest: CTAB, no wheezes, rales, or rhonchi Abdominal: Soft. Non-tender, non-distended, bowel sounds are normal, no masses, organomegaly, or guarding present.  GU: no CVA tenderness Musculoskeletal: No joint deformities, erythema, or stiffness, ROM full and no nontender Hematology: no cervical, inginal, or axillary adenopathy.  Neurological: A&O x3, Strength is normal and symmetric bilaterally, cranial nerve II-XII are grossly intact, no focal motor deficit, sensory intact to light touch bilaterally.  Skin: Warm, dry and intact. No rash, cyanosis, or clubbing.  Psychiatric: Normal mood and affect. speech and behavior is normal. Judgment and thought content normal. Cognition and memory are normal.    Assessment & Plan:

## 2010-10-27 NOTE — Assessment & Plan Note (Signed)
Discussed the results of the sleep study with Patrick [name] today.  Discussed why it would be useful to treat this and could possibly needed to help control his blood pressure as well as daytime sleepiness and diabetes even.  He states that he will think about it and we can discuss it as his follow up.

## 2010-10-28 LAB — MICROALBUMIN / CREATININE URINE RATIO
Creatinine, Urine: 119.8 mg/dL
Microalb Creat Ratio: 313.9 mg/g — ABNORMAL HIGH (ref 0.0–30.0)
Microalb, Ur: 37.6 mg/dL — ABNORMAL HIGH (ref 0.00–1.89)

## 2010-10-30 NOTE — Assessment & Plan Note (Signed)
Lab Results  Component Value Date   CHOL 153 10/27/2010   CHOL 137 04/22/2009   CHOL 164 06/09/2008   Lab Results  Component Value Date   HDL 34* 10/27/2010   HDL 38* 04/22/2009   HDL 39* 06/09/2008   Lab Results  Component Value Date   LDLCALC 87 10/27/2010   LDLCALC 62 04/22/2009   LDLCALC 87 06/09/2008   Lab Results  Component Value Date   TRIG 162* 10/27/2010   TRIG 185* 04/22/2009   TRIG 192* 06/09/2008   Lab Results  Component Value Date   CHOLHDL 4.5 10/27/2010   CHOLHDL 3.6 Ratio 04/22/2009   CHOLHDL 4.2 Ratio 06/09/2008   Patrick [name] is near his goal of <70 LDL but has been better controlled in the past.  With his rise in A1C also lately he likely has not been as compliant with his diet recently.  We discussed the need for close attention to his diet and making sure he takes his medications as directed every day.  We will monitor him again in 6 months to see where he is at and if needed will make adjustments to his regimen at that time.

## 2010-10-30 NOTE — Assessment & Plan Note (Addendum)
Patrick [name] blood pressure is unchanged from previously.  As with his other chronic problems he likely has not been compliant with diet and exercise as needed.  He also has still been hesitant to start CPAP therapy which could potentially help all of his major chronic problems.  We will monitor his electrolytes today because of his diuretic use.  He is however much improved form his December appointment when he was totally out of his medications!  BP Readings from Last 3 Encounters:  10/27/10 134/83  07/07/10 132/81  06/30/10 199/110

## 2010-10-30 NOTE — Assessment & Plan Note (Signed)
Lab Results  Component Value Date   HGBA1C 11.0 10/27/2010   HGBA1C 10.6 06/30/2010   HGBA1C 11.6 01/10/2010   Lab Results  Component Value Date   MICROALBUR 37.60* 10/27/2010   LDLCALC 87 10/27/2010   CREATININE 1.85* 10/27/2010   [name]'s a1c is unchanged from and possibly worse from his previous level.  [name] was originally hesitant to start insulin so I suspect that he has had some problems with compliance.  We discussed today how it was going and he states he is taking his medications.  His meter download is not available at this time so I'm not sure how much to adjust his insulin.  We discussed the need to manage his diabetes closely and he states that he will continue to try to take his medications and modify his diet.

## 2010-10-30 NOTE — Assessment & Plan Note (Signed)
He has a history of atrial fibrillation and is on coumadin.  His coumadin is managed by Dr. Cherene Julian from pharmacy and he has remained therapeutic and has no complications so we will continue the current course per Dr. Martyn Ehrich plan.  He will need continued coumadin therapy because of Atrial Fibrillation.

## 2010-11-02 LAB — GLUCOSE, CAPILLARY: Glucose-Capillary: 251 mg/dL — ABNORMAL HIGH (ref 70–99)

## 2010-11-03 LAB — GLUCOSE, CAPILLARY: Glucose-Capillary: 282 mg/dL — ABNORMAL HIGH (ref 70–99)

## 2010-11-13 LAB — GLUCOSE, CAPILLARY: Glucose-Capillary: 318 mg/dL — ABNORMAL HIGH (ref 70–99)

## 2010-11-20 ENCOUNTER — Ambulatory Visit (INDEPENDENT_AMBULATORY_CARE_PROVIDER_SITE_OTHER): Payer: Medicare Other | Admitting: Pharmacist

## 2010-11-20 DIAGNOSIS — Z7901 Long term (current) use of anticoagulants: Secondary | ICD-10-CM

## 2010-11-20 DIAGNOSIS — I4891 Unspecified atrial fibrillation: Secondary | ICD-10-CM

## 2010-11-20 LAB — POCT INR: INR: 3.3

## 2010-11-20 NOTE — Progress Notes (Signed)
Anti-Coagulation Progress Note  Patrick [name] is a 65 y.o. male who is currently on an anti-coagulation regimen.    RECENT RESULTS: Recent results are below, the most recent result is correlated with a dose of 37.5 mg. per week: Lab Results  Component Value Date   INR 3.3 11/20/2010   INR 2.5 10/23/2010   INR 2.7 09/25/2010    ANTI-COAG DOSE:   Latest dosing instructions   Total Sun Mon Tue Wed Thu Fri Sat   35 5 mg 5 mg 5 mg 5 mg 5 mg 5 mg 5 mg    (5 mg1) (5 mg1) (5 mg1) (5 mg1) (5 mg1) (5 mg1) (5 mg1)         ANTICOAG SUMMARY: Anticoagulation Episode Summary              Current INR goal 2.0-3.0 Next INR check 12/18/2010   INR from last check 3.3! (11/20/2010)     Weekly max dose (mg)  Target end date Indefinite   Indications ATRIAL FIBRILLATION, PAROXYSMAL, CHRONIC, Long term current use of anticoagulant   INR check location  Preferred lab    Send INR reminders to [facility]   Comments        Provider Role Specialty Phone number   Larey Dresser  Internal Medicine [phone]        ANTICOAG TODAY: Anticoagulation Summary as of 11/20/2010              INR goal 2.0-3.0     Selected INR 3.3! (11/20/2010) Next INR check 12/18/2010   Weekly max dose (mg)  Target end date Indefinite   Indications ATRIAL FIBRILLATION, PAROXYSMAL, CHRONIC, Long term current use of anticoagulant    Anticoagulation Episode Summary              INR check location  Preferred lab    Send INR reminders to [facility]   Comments        Provider Role Specialty Phone number   Larey Dresser  Internal Medicine [phone]        PATIENT INSTRUCTIONS: Patient Instructions  Patient instructed to take medications as defined in the Anti-coagulation Track section of this encounter.  Patient instructed to take today's dose.  Patient verbalized understanding of these instructions.        FOLLOW-UP Return in about 4 weeks (around 12/18/2010) for Follow up INR.  [name],  III Pharm.D., CACP

## 2010-11-20 NOTE — Patient Instructions (Signed)
Patient instructed to take medications as defined in the Anti-coagulation Track section of this encounter.  Patient instructed to take today's dose.  Patient verbalized understanding of these instructions.    

## 2010-11-22 ENCOUNTER — Emergency Department (HOSPITAL_BASED_OUTPATIENT_CLINIC_OR_DEPARTMENT_OTHER)
Admission: EM | Admit: 2010-11-22 | Discharge: 2010-11-22 | Disposition: A | Discharge status: Nursing Home (Includes ICF) | Payer: Medicare Other | Source: Home / Self Care | Attending: Emergency Medicine | Admitting: Emergency Medicine

## 2010-11-22 ENCOUNTER — Other Ambulatory Visit (HOSPITAL_COMMUNITY): Payer: Medicare Other

## 2010-11-22 ENCOUNTER — Inpatient Hospital Stay (HOSPITAL_COMMUNITY): Payer: Medicare Other

## 2010-11-22 ENCOUNTER — Emergency Department (INDEPENDENT_AMBULATORY_CARE_PROVIDER_SITE_OTHER): Payer: Medicare Other

## 2010-11-22 ENCOUNTER — Encounter: Payer: Self-pay | Admitting: Internal Medicine

## 2010-11-22 ENCOUNTER — Inpatient Hospital Stay (HOSPITAL_COMMUNITY)
Admission: AD | Admit: 2010-11-22 | Discharge: 2010-11-30 | DRG: 065 | Disposition: A | Discharge status: Home Health | Payer: Medicare Other | Source: Other Acute Inpatient Hospital | Attending: Internal Medicine | Admitting: Internal Medicine

## 2010-11-22 DIAGNOSIS — E1169 Type 2 diabetes mellitus with other specified complication: Secondary | ICD-10-CM | POA: Insufficient documentation

## 2010-11-22 DIAGNOSIS — I2510 Atherosclerotic heart disease of native coronary artery without angina pectoris: Secondary | ICD-10-CM | POA: Diagnosis present

## 2010-11-22 DIAGNOSIS — I517 Cardiomegaly: Secondary | ICD-10-CM

## 2010-11-22 DIAGNOSIS — N179 Acute kidney failure, unspecified: Secondary | ICD-10-CM | POA: Diagnosis not present

## 2010-11-22 DIAGNOSIS — I651 Occlusion and stenosis of basilar artery: Secondary | ICD-10-CM | POA: Diagnosis present

## 2010-11-22 DIAGNOSIS — IMO0001 Reserved for inherently not codable concepts without codable children: Secondary | ICD-10-CM | POA: Diagnosis present

## 2010-11-22 DIAGNOSIS — I639 Cerebral infarction, unspecified: Secondary | ICD-10-CM

## 2010-11-22 DIAGNOSIS — N183 Chronic kidney disease, stage 3 unspecified: Secondary | ICD-10-CM | POA: Diagnosis present

## 2010-11-22 DIAGNOSIS — Z794 Long term (current) use of insulin: Secondary | ICD-10-CM

## 2010-11-22 DIAGNOSIS — I658 Occlusion and stenosis of other precerebral arteries: Secondary | ICD-10-CM | POA: Diagnosis present

## 2010-11-22 DIAGNOSIS — I10 Essential (primary) hypertension: Secondary | ICD-10-CM | POA: Insufficient documentation

## 2010-11-22 DIAGNOSIS — F329 Major depressive disorder, single episode, unspecified: Secondary | ICD-10-CM | POA: Diagnosis present

## 2010-11-22 DIAGNOSIS — I6350 Cerebral infarction due to unspecified occlusion or stenosis of unspecified cerebral artery: Principal | ICD-10-CM | POA: Diagnosis present

## 2010-11-22 DIAGNOSIS — Z7901 Long term (current) use of anticoagulants: Secondary | ICD-10-CM

## 2010-11-22 DIAGNOSIS — F3289 Other specified depressive episodes: Secondary | ICD-10-CM | POA: Diagnosis present

## 2010-11-22 DIAGNOSIS — I498 Other specified cardiac arrhythmias: Secondary | ICD-10-CM | POA: Diagnosis present

## 2010-11-22 DIAGNOSIS — I4891 Unspecified atrial fibrillation: Secondary | ICD-10-CM

## 2010-11-22 DIAGNOSIS — R404 Transient alteration of awareness: Secondary | ICD-10-CM

## 2010-11-22 DIAGNOSIS — R4182 Altered mental status, unspecified: Secondary | ICD-10-CM | POA: Insufficient documentation

## 2010-11-22 DIAGNOSIS — M109 Gout, unspecified: Secondary | ICD-10-CM | POA: Diagnosis present

## 2010-11-22 DIAGNOSIS — E785 Hyperlipidemia, unspecified: Secondary | ICD-10-CM | POA: Diagnosis present

## 2010-11-22 DIAGNOSIS — G4733 Obstructive sleep apnea (adult) (pediatric): Secondary | ICD-10-CM | POA: Diagnosis present

## 2010-11-22 DIAGNOSIS — E7800 Pure hypercholesterolemia, unspecified: Secondary | ICD-10-CM | POA: Insufficient documentation

## 2010-11-22 DIAGNOSIS — I129 Hypertensive chronic kidney disease with stage 1 through stage 4 chronic kidney disease, or unspecified chronic kidney disease: Secondary | ICD-10-CM | POA: Diagnosis present

## 2010-11-22 DIAGNOSIS — Z91199 Patient's noncompliance with other medical treatment and regimen due to unspecified reason: Secondary | ICD-10-CM

## 2010-11-22 DIAGNOSIS — Z7982 Long term (current) use of aspirin: Secondary | ICD-10-CM

## 2010-11-22 DIAGNOSIS — Z79899 Other long term (current) drug therapy: Secondary | ICD-10-CM | POA: Insufficient documentation

## 2010-11-22 DIAGNOSIS — N289 Disorder of kidney and ureter, unspecified: Secondary | ICD-10-CM | POA: Insufficient documentation

## 2010-11-22 DIAGNOSIS — Z8679 Personal history of other diseases of the circulatory system: Secondary | ICD-10-CM | POA: Insufficient documentation

## 2010-11-22 DIAGNOSIS — Z9119 Patient's noncompliance with other medical treatment and regimen: Secondary | ICD-10-CM

## 2010-11-22 HISTORY — DX: Cerebral infarction, unspecified: I63.9

## 2010-11-22 LAB — GLUCOSE, CAPILLARY
Glucose-Capillary: 354 mg/dL — ABNORMAL HIGH (ref 70–99)
Glucose-Capillary: 118 mg/dL — ABNORMAL HIGH (ref 70–99)

## 2010-11-22 LAB — COMPREHENSIVE METABOLIC PANEL
BUN: 21 mg/dL (ref 6–23)
CO2: 30 mEq/L (ref 19–32)
Calcium: 9.2 mg/dL (ref 8.4–10.5)
Creatinine, Ser: 1.87 mg/dL — ABNORMAL HIGH (ref 0.4–1.5)
GFR calc Af Amer: 44 mL/min — ABNORMAL LOW (ref >60)
GFR calc non Af Amer: 37 mL/min — ABNORMAL LOW (ref >60)
Glucose, Bld: 184 mg/dL — ABNORMAL HIGH (ref 70–99)

## 2010-11-22 LAB — BASIC METABOLIC PANEL
BUN: 28 mg/dL — ABNORMAL HIGH (ref 6–23)
CO2: 26 mEq/L (ref 19–32)
Chloride: 100 mEq/L (ref 96–112)
Creatinine, Ser: 2.1 mg/dL — ABNORMAL HIGH (ref 0.4–1.5)

## 2010-11-22 LAB — CARDIAC PANEL(CRET KIN+CKTOT+MB+TROPI)
CK, MB: 2.7 ng/mL (ref 0.3–4.0)
Total CK: 131 U/L (ref 7–232)
Troponin I: 0.1 ng/mL — ABNORMAL HIGH (ref 0.00–0.06)
Troponin I: 0.08 ng/mL — ABNORMAL HIGH (ref 0.00–0.06)

## 2010-11-22 LAB — URINALYSIS, ROUTINE W REFLEX MICROSCOPIC
Bilirubin Urine: NEGATIVE
Bilirubin Urine: NEGATIVE
Glucose, UA: >1000 mg/dL — AB
Hgb urine dipstick: NEGATIVE
Ketones, ur: NEGATIVE mg/dL
Nitrite: NEGATIVE
Specific Gravity, Urine: 1.021 (ref 1.005–1.030)
pH: 5.5 (ref 5.0–8.0)
pH: 6 (ref 5.0–8.0)

## 2010-11-22 LAB — CBC
Hemoglobin: 14.2 g/dL (ref 13.0–17.0)
MCH: 25.8 pg — ABNORMAL LOW (ref 26.0–34.0)
MCH: 26.9 pg (ref 26.0–34.0)
MCV: 75.7 fL — ABNORMAL LOW (ref 78.0–100.0)
Platelets: 204 10*3/uL (ref 150–400)
RBC: 5.51 MIL/uL (ref 4.22–5.81)
RBC: 5.8 MIL/uL (ref 4.22–5.81)
RDW: 13.3 % (ref 11.5–15.5)

## 2010-11-22 LAB — RAPID URINE DRUG SCREEN, HOSP PERFORMED
Amphetamines: NOT DETECTED
Barbiturates: NOT DETECTED
Opiates: NOT DETECTED
Tetrahydrocannabinol: NOT DETECTED

## 2010-11-22 LAB — URINE MICROSCOPIC-ADD ON

## 2010-11-22 LAB — MRSA PCR SCREENING: MRSA by PCR: NEGATIVE

## 2010-11-22 LAB — DIFFERENTIAL
Eosinophils Absolute: 0.5 10*3/uL (ref 0.0–0.7)
Lymphs Abs: 1.8 10*3/uL (ref 0.7–4.0)
Monocytes Relative: 10 % (ref 3–12)
Neutro Abs: 3.4 10*3/uL (ref 1.7–7.7)
Neutrophils Relative %: 54 % (ref 43–77)

## 2010-11-22 LAB — APTT
aPTT: 49 seconds — ABNORMAL HIGH (ref 24–37)
aPTT: 45 seconds — ABNORMAL HIGH (ref 24–37)

## 2010-11-22 LAB — POCT CARDIAC MARKERS

## 2010-11-22 LAB — HEMOGLOBIN A1C: Mean Plasma Glucose: 292 mg/dL — ABNORMAL HIGH (ref <117)

## 2010-11-22 LAB — ETHANOL: Alcohol, Ethyl (B): <5 mg/dL (ref 0–10)

## 2010-11-22 NOTE — H&P (Signed)
Hospital Admission Note Date: 11/22/2010  Patient name: Patrick [name] Medical record number: [id number] Date of birth: February 12, 1946 Age: 64 y.o. Gender: male PCP: PRIBULA,[name], MD  Medical Service: Internal Medicine  Attending physician:  Dr. Milta Deiters    Pager: Resident [phone]): Dr. Stanford Scotland     Pager: [phone] Resident (R1): Dr. Guy Sandifer     Pager: [phone]  Chief Complaint: Altered mental status  History of Present Illness:  No current outpatient prescriptions on file.    Allergies: Review of patient's allergies indicates no known allergies.   Past Medical History:  Hypertension:Uncontrolled                       H/o medical noncompliance                       Hospitalised*2 for HTN urgency                       w/u for refractory HTN neg incl RAS(neg MRA), neg renal/aorta ultrasound in 03/2003 Non-obstructive [id number] cath 9/05 by [facility]                                     20-30%proxRCA,30%proxLAD--recommended medical management with aggressive BP control Diabetes mellitus, type II Hyperlipidemia H/o CVA:Rt Cerebellar hemorrhage 1/07 Chronic Kidney disease:Stage 3                                       Secondary to poorly controlled HTN                                       Baseline creatinine 2.0-2.2                                       Followed @[facility] ,Dr.[place] Paroxysmal Atrial Fibrillation:2D ECHO(5/07):LVef 60-70%,mild aortic root dilatation,LA mod dilated, Off anticoagulation sec to cerebellar hemorrhage in 07/2005 H/o Bradycardia:with mild pause 1.2sec while on beta blocker 1/07. EP:No pacer needed.No evidence of SAN dysfunction Depression Gout:First episode 11/07;Hospitalized Mildly enlarged prostate on Renal U/S 1/07 Sleep apnea  Past surgical history   Family history   History   Social History  . Marital Status: Married    [name]    Number of Children: N/A  . Years of Education: N/A    Occupational History  .    Social History Main Topics  . Smoking status: Former Smoker    Types: Cigarettes    Quit date: 10/26/1976  . Smokeless tobacco: Never Used  . Alcohol Use: No  . Drug Use: No  .        Review of Systems: As per HPI; all other ROS are negative  Physical Exam: T    BP    HR   RR    O2 sat  GEN: HEENT: Lungs: Heart: Abd: Ext: Neuro:   Lab results:  Imaging results:   Other results:  Assessment & Plan by Problem:  1. Altered Mental Status: unclear etiology at this time.  Differential diagnosis include medication induced, stroke/intracranial hemorrhage, infectious etiology such as meningitis, hypertensive encephalopathy, or electrolyte imbalances.  2. Bradycardia:  3. CAD s/p cath in 2005.  Stable  4. Chronic Atrial fibrillation: currently on Coumadin at home.    5. CKD stage III: Baseline Cr 2-2.2.  Current Cr 2.1, will continue to monitor with [facility].  6. Hypertension:  7. DM type II: uncontrolled 2/2 history of medication non-compliance.

## 2010-11-22 NOTE — H&P (Signed)
Hospital Admission Note Date: 11/22/2010  Patient name: Patrick [name] Medical record number: [id number] Date of birth: May 20, 1946 Age: 65 y.o. Gender: male PCP: PRIBULA,[name], MD  Medical Service: Internal Medicine Teaching Service  Attending physician:  Dr. Lars Mage     Resident [phone]): Dr. Kelton Pillar     Pager: [phone] Resident (R1): Dr. Obie Dredge     Pager: [phone]  Chief Complaint: Altered mental status  History of Present Illness:  65 yr old man with pmhx Atrial Fibrillation on coumadin, uncontrolled DM, HTN, Hyperlipidemia, Nonobstructive CAD, hx of bradycardia was transferred from [facility] for [facility]. History was provided by wife. Reports that for the last 2-3 weeks patient has been slower than usual to respond verbally. She noticed that Monday he was sleeping more than usual. On Tuesday she noticed he was walking slower. Patient when asked about his walking responded by telling his wife that he was feeling weak and didn't feel good but gave no other details. Patient also started to be incoherent, had slurred speech, and was confused. Patient in [place] was given to doses of atropine as HR 30 brought HR to 50's. Denies chest pain, shortness of breath, diaphoresis, palpitations, abdominal pain, n/v/d, fever/chills, headache, blurry vision, or cough.   Allergies: NKDA  Medications: Current Outpatient Prescriptions on File Prior to Visit  Medication Sig Dispense Refill  . aspirin 81 MG EC tablet Take 81 mg by mouth daily.        . benazepril (LOTENSIN) 40 MG tablet Take 80 mg by mouth daily.        . Blood Glucose Monitoring Suppl (ACCU-CHEK AVIVA PLUS) W/DEVICE KIT 1 each by Does not apply route as needed.  1 kit  0  . cloNIDine (CATAPRES) 0.3 MG tablet Take 0.3 mg by mouth 2 (two) times daily. For blood pressure       . diltiazem (CARDIZEM) 90 MG tablet Take 180 mg by mouth 2 (two) times daily.        . famotidine (PEPCID) 20 MG tablet Take 2 tablets (40 mg total)  by mouth daily.  31 tablet  3  . glipiZIDE (GLUCOTROL) 10 MG tablet Take 15 mg by mouth 2 (two) times daily.        Marland Kitchen glucose blood (ACCU-CHEK ADVANTAGE TEST) test strip Use as instructed  100 each  12  . glucose blood (TRUETRACK TEST) test strip Use to test blood sugar 2-3 times daily       . insulin NPH (NOVOLIN N) 100 UNIT/ML injection Inject 25 Units into the skin at bedtime.  10 mL  6  . Insulin Syringe-Needle U-100 (ELITE-THIN INS SYR .5CC/31G) 31G X 5/16" 0.5 ML MISC Use to inject insulin daily       . Lancets (ACCU-CHEK MULTICLIX) lancets Use as instructed  100 each  12  . Lancets 30G MISC Use to test blood glucose 2-3 times daily       . Omega-3 Fatty Acids (FISH OIL) 1000 MG CAPS Take 1 capsule by mouth 2 (two) times daily.        . pravastatin (PRAVACHOL) 40 MG tablet Take 2 tablets (80 mg total) by mouth daily.  60 tablet  6  . warfarin (COUMADIN) 5 MG tablet Take 5 mg by mouth as directed.          Past Medical History: Hypertension:Uncontrolled                       H/o medical  noncompliance                       Hospitalised*2 for HTN urgency                       w/u for refractory HTN neg incl RAS(neg MRA), neg renal/aorta ultrasound in 03/2003 Non-obstructive [id number] cath 9/05 by [facility]                                     20-30%proxRCA,30%proxLAD--recommended medical management with aggressive BP control Diabetes mellitus, type II Hyperlipidemia H/o CVA:Rt Cerebellar hemorrhage 1/07 Chronic Kidney disease:Stage 3                                       Secondary to poorly controlled HTN                                       Baseline creatinine 2.0-2.2                                       Followed @[facility] ,Dr.G[place]borough Paroxysmal Atrial Fibrillation:2D ECHO(5/07):LVef 60-70%,mild aortic root dilatation,LA mod dilated, H/o Bradycardia:with mild pause 1.2sec while on beta blocker 1/07. EP:No pacer needed.No evidence of SAN  dysfunction Depression Gout:First episode 11/07;Hospitalized Mildly enlarged prostate on Renal U/S 1/07 Sleep apnea   Family history: The patient's mother died at age 78. The patient's father died at age 50, with a heart attack.   Social History: The patient is married.  He has four children.  He lives in [place].  He is employed as a Presenter, broadcasting.  He denies tobacco, ethanol or drug use.   Review of Systems: All 12 point system reviewed otherwise negative per HPI.  Physical Exam: T: 97.4    BP: 151/90>>200/96    HR: 45>>38   RR: 16    O2 sat: 100 ra  GEN: Confused, incoherent, NAD HEENT: EOMI, PERRLA, MMM, No JVD, Pharynx nonerythematous, no exudates Lungs:CTA Bilaterally anteriorly, no wheezes, rhonchi or crackles Heart:: Brady irregular, No murmurs, rubs, or gallops Abd:BS +, Soft, NT/ND/NR/NG Ext: no edema or cyanosis Neuro: A&OX2 (to person and place), right sided droop, 5/5 strength throughout, sensation intact, cerebellar intact,    Lab results: WBC                                      6.3               4.0-10.5         K/uL  RBC                                      5.51              4.22-5.81        MIL/uL  Hemoglobin (HGB)  14.2              13.0-17.0        g/dL  Hematocrit (HCT)                         41.7              39.0-52.0        %  MCV                                      75.7       l      78.0-100.0       fL  MCH -                                    25.8       l      26.0-34.0        pg  MCHC                                     34.1              30.0-36.0        g/dL  RDW                                      13.5              11.5-15.5        %  Platelet Count (PLT)                     206               150-400          K/uL  Neutrophils, %                           54                43-77            %  Lymphocytes, %                           28                12-46            %  Monocytes, %                             10                 3-12             %  Eosinophils, %                           8          h      0-5              %  Basophils, %                             1                 0-1              %  Neutrophils, Absolute                    3.4               1.7-7.7          K/uL  Lymphocytes, Absolute                    1.8               0.7-4.0          K/uL  Monocytes, Absolute                      0.6               0.1-1.0          K/uL  Eosinophils, Absolute                    0.5               0.0-0.7          K/uL  Basophils, Absolute                      0.0               0.0-0.1          K/uL   Sodium (NA)                              139               135-145          mEq/L  Potassium (K)                            3.6               3.5-5.1          mEq/L  Chloride                                 100               96-112           mEq/L  CO2                                      26                19-32            mEq/L  Glucose                                  432        h      70-99  mg/dL  BUN                                      28         h      6-23             mg/dL  Creatinine                               2.1        h      0.4-1.5          mg/dL  GFR, Est Non African American            32         l      >60              mL/min  GFR, Est African American                39         l      >60              mL/min    Oversized comment, see footnote  1  Calcium                                  9.0               8.4-10.5         Mg/dL   Protime ( Prothrombin Time)              26.3       h      11.6-15.2        seconds  INR                                      2.40       h      0.00-1.49  PTT(a-Partial Thromboplastn Time)        49         h      24-37            seconds  CKMB, POC                                1.2               1.0-8.0          ng/mL  Troponin I, POC                          <0.05             0.00-0.09        ng/mL  Myoglobin, POC                            88.4              12-200           ng/mL  Color, Urine  YELLOW            YELLOW  Appearance                               CLEAR             CLEAR  Specific Gravity                         1.018             1.005-1.030  pH                                       5.5               5.0-8.0  Urine Glucose                            >1000      a      NEG              mg/dL  Bilirubin                                NEGATIVE          NEG  Ketones                                  NEGATIVE          NEG              mg/dL  Blood                                    NEGATIVE          NEG  Protein                                  30         a      NEG              mg/dL  Urobilinogen                             1.0               0.0-1.0          mg/dL  Nitrite                                  NEGATIVE          NEG  Leukocytes                               NEGATIVE          NEG  Squamous Epithelial / LPF                RARE  RARE  Casts / HPF                              SEE NOTE.  a      NEG    HYALINE CASTS  WBC / HPF                                0-2               <3               WBC/hpf  RBC / HPF                                0-2               <3               RBC/hpf  Bacteria / HPF                           RARE              RARE  Urine-Other                              SEE NOTE.    MUCOUS PRESENT   Imaging results:   CT Head w/out contrast:  1) Hypoattenuation within the anterior left thalamus reflects an infarct of indeterminate age; no evidence of hemorrhagic transformation. This is new from the prior study. 2) Mild to moderate cortical volume loss noted.   Assessment & Plan by Problem:  1. Altered Mental Status: Most likely 2/2 stroke/intracranial hemorrhage. Admitted to ICU. Stroke order set used. Will get full stroke workup which include MRI Head, MRA Head/Neck, 2d Echo, and Carotid dopplers. Review imaging, labs and reassess.  2.  Bradycardia: Patient maintaining saturation and BP. Cardiology consulted for further evaluation. Appreciate recommendations.  3. CAD s/p cath in 2005.  Stable. Cycle cardiac enzymes. Continue aspirin/statin.   4. Chronic Atrial fibrillation: currently on Coumadin. Hold diltiazem for now.  5. CKD stage III: Baseline Cr 2-2.2.  Current Cr 2.1, will continue to monitor with BMET.  6. Hypertension: Hold oral antihypertensives for now. If BP> 200/96 (MAP- 133) will give prn IV hydralazine. Will not lower MAP more than 25% in the next 24 hrs.   7. DM type II: Placed on ICU hyperglycemia protocol.  8. DVT ppx: coumadin    Akhila Mahnken Vega[place],  [phone]________________________     ATTENDING: I performed and/or observed a history and physical examination of the patient.  I discussed the case with the residents as noted and reviewed the residents' notes.  I agree with the findings and plan--please refer to the attending physician note for more details.   Signature________________________________  Printed Name_____________________________

## 2010-11-22 NOTE — H&P (Signed)
Hospital Admission Note Date: 11/22/2010  Patient name: Patrick [name] Medical record number: [id number] Date of birth: 02-Apr-1946 Age: 65 y.o. Gender: male PCP: PRIBULA,[name], MD  Medical Service: Internal Medicine  Attending physician:  Dr. Milta Deiters    Pager: Resident [phone]): Dr. Stanford Scotland     Pager: [phone] Resident (R1): Dr. Guy Sandifer     Pager: [phone]  Chief Complaint: Altered mental status  History of Present Illness:  Current Outpatient Prescriptions  Medication Sig Dispense Refill  . aspirin 81 MG EC tablet Take 81 mg by mouth daily.        . benazepril (LOTENSIN) 40 MG tablet Take 80 mg by mouth daily.        . Blood Glucose Monitoring Suppl (ACCU-CHEK AVIVA PLUS) W/DEVICE KIT 1 each by Does not apply route as needed.  1 kit  0  . cloNIDine (CATAPRES) 0.3 MG tablet Take 0.3 mg by mouth 2 (two) times daily. For blood pressure       . diltiazem (CARDIZEM) 90 MG tablet Take 180 mg by mouth 2 (two) times daily.        . famotidine (PEPCID) 20 MG tablet Take 2 tablets (40 mg total) by mouth daily.  31 tablet  3  . glipiZIDE (GLUCOTROL) 10 MG tablet Take 15 mg by mouth 2 (two) times daily.        Marland Kitchen glucose blood (ACCU-CHEK ADVANTAGE TEST) test strip Use as instructed  100 each  12  . glucose blood (TRUETRACK TEST) test strip Use to test blood sugar 2-3 times daily       . insulin NPH (NOVOLIN N) 100 UNIT/ML injection Inject 25 Units into the skin at bedtime.  10 mL  6  . Insulin Syringe-Needle U-100 (ELITE-THIN INS SYR .5CC/31G) 31G X 5/16" 0.5 ML MISC Use to inject insulin daily       . Lancets (ACCU-CHEK MULTICLIX) lancets Use as instructed  100 each  12  . Lancets 30G MISC Use to test blood glucose 2-3 times daily       . Omega-3 Fatty Acids (FISH OIL) 1000 MG CAPS Take 1 capsule by mouth 2 (two) times daily.        . pravastatin (PRAVACHOL) 40 MG tablet Take 2 tablets (80 mg total) by mouth daily.  60 tablet  6  . warfarin (COUMADIN) 5 MG tablet Take 5 mg by  mouth as directed.          Allergies: Review of patient's allergies indicates no known allergies.   Past Medical History:  Hypertension:Uncontrolled                       H/o medical noncompliance                       Hospitalised*2 for HTN urgency                       w/u for refractory HTN neg incl RAS(neg MRA), neg renal/aorta ultrasound in 03/2003 Non-obstructive [id number] cath 9/05 by [facility]                                     20-30%proxRCA,30%proxLAD--recommended medical management with aggressive BP control Diabetes mellitus, type II Hyperlipidemia H/o CVA:Rt Cerebellar hemorrhage 1/07 Chronic Kidney disease:Stage 3  Secondary to poorly controlled HTN                                       Baseline creatinine 2.0-2.2                                       Followed @[facility] ,Dr.Goldsborough Paroxysmal Atrial Fibrillation:2D ECHO(5/07):LVef 60-70%,mild aortic root dilatation,LA mod dilated,                                      Off anticoagulation sec to cerebellar hemorrhage in 07/2005 H/o Bradycardia:with mild pause 1.2sec while on beta blocker 1/07.                           EP:No pacer needed.No evidence of SAN dysfunction Depression Gout:First episode 11/07;Hospitalized Mildly enlarged prostate on Renal U/S 1/07 Sleep apnea  Past surgical history   Family history   History   Social History  . Marital Status: Married    [name]    Number of Children: N/A  . Years of Education: N/A   Occupational History  .    Social History Main Topics  . Smoking status: Former Smoker    Types: Cigarettes    Quit date: 10/26/1976  . Smokeless tobacco: Never Used  . Alcohol Use: No  . Drug Use: No  .        Review of Systems: As per HPI; all other ROS are negative  Physical Exam: T    BP    HR   RR    O2 sat  GEN: HEENT: Lungs: Heart: Abd: Ext: Neuro:   Lab results:  Imaging results:    Other results:  Assessment & Plan by Problem:  1. Altered Mental Status: unclear etiology at this time.  Differential diagnosis include medication induced, stroke/intracranial hemorrhage, infectious etiology such as meningitis, hypertensive encephalopathy, or electrolyte imbalances.    2. Bradycardia:  3. CAD s/p cath in 2005.  Stable  4. Chronic Atrial fibrillation: currently on Coumadin at home.  Will continue Coumadin per pharmacy and check PT/INR  5. CKD stage III: Baseline Cr 2-2.2.  Current Cr 2.1, will continue to monitor with BMET.  6. Hypertension:  7. DM type II: uncontrolled 2/2 history of medication non-compliance.    Bethel Born ________________________________[phone]  Tyrell Antonio Sidhu________________________________[phone]

## 2010-11-22 NOTE — H&P (Signed)
Hospital Admission Note Date: 11/22/2010  Patient name: Patrick [name] Medical record number: [id number] Date of birth: 1946/05/28 Age: 65 y.o. Gender: male PCP: PRIBULA,[name], MD  Medical Service: Internal Medicine Teaching Service  Attending physician:  Dr. Lars Mage     Resident [phone]): Dr. Kelton Pillar     Pager: [phone] Resident (R1): Dr. Obie Dredge     Pager: [phone]  Chief Complaint: Altered mental status  History of Present Illness: 65 yr old man with pmhx uncontrolled DM  No current outpatient prescriptions on file.    Allergies: Review of patient's allergies indicates no known allergies.   Past Medical History:  Hypertension:Uncontrolled                       H/o medical noncompliance                       Hospitalised*2 for HTN urgency                       w/u for refractory HTN neg incl RAS(neg MRA), neg renal/aorta ultrasound in 03/2003 Non-obstructive [id number] cath 9/05 by [facility]                                     20-30%proxRCA,30%proxLAD--recommended medical management with aggressive BP control Diabetes mellitus, type II Hyperlipidemia H/o CVA:Rt Cerebellar hemorrhage 1/07 Chronic Kidney disease:Stage 3                                       Secondary to poorly controlled HTN                                       Baseline creatinine 2.0-2.2                                       Followed @[facility] ,Dr.Goldsborough Paroxysmal Atrial Fibrillation:2D ECHO(5/07):LVef 60-70%,mild aortic root dilatation,LA mod dilated, Off anticoagulation sec to cerebellar hemorrhage in 07/2005 H/o Bradycardia:with mild pause 1.2sec while on beta blocker 1/07. EP:No pacer needed.No evidence of SAN dysfunction Depression Gout:First episode 11/07;Hospitalized Mildly enlarged prostate on Renal U/S 1/07 Sleep apnea  Past surgical history   Family history   History   Social History  . Marital Status: Married    [name]    Number of Children: N/A    . Years of Education: N/A   Occupational History  .    Social History Main Topics  . Smoking status: Former Smoker    Types: Cigarettes    Quit date: 10/26/1976  . Smokeless tobacco: Never Used  . Alcohol Use: No  . Drug Use: No  .        Review of Systems: As per HPI; all other ROS are negative  Physical Exam: T    BP    HR   RR    O2 sat  GEN: HEENT: Lungs: Heart: Abd: Ext: Neuro:   Lab results:  Imaging results:   Other results:  Assessment & Plan by Problem:  1. Altered Mental Status: unclear etiology at this time.  Differential diagnosis include medication induced, stroke/intracranial  hemorrhage, infectious etiology such as meningitis, hypertensive encephalopathy, or electrolyte imbalances.    2. Bradycardia:  3. CAD s/p cath in 2005.  Stable  4. Chronic Atrial fibrillation: currently on Coumadin at home.    5. CKD stage III: Baseline Cr 2-2.2.  Current Cr 2.1, will continue to monitor with BMET.  6. Hypertension:  7. DM type II: uncontrolled 2/2 history of medication non-compliance.

## 2010-11-22 NOTE — H&P (Signed)
Hospital Admission Note Date: 11/22/2010  Patient name: Patrick [name] Medical record number: [id number] Date of birth: 10/18/1945 Age: 65 y.o. Gender: male PCP: PRIBULA,[name], MD  Medical Service: Internal Medicine  Attending physician:  Dr. Milta Deiters    Pager: Resident [phone]): Dr. Stanford Scotland     Pager: [phone] Resident (R1): Dr. Guy Sandifer     Pager: [phone]  Chief Complaint: Altered mental status  History of Present Illness:  No current outpatient prescriptions on file.    Allergies: Review of patient's allergies indicates no known allergies.   Past Medical History:  Hypertension:Uncontrolled                       H/o medical noncompliance                       Hospitalised*2 for HTN urgency                       w/u for refractory HTN neg incl RAS(neg MRA), neg renal/aorta ultrasound in 03/2003 Non-obstructive [id number] cath 9/05 by [facility]                                     20-30%proxRCA,30%proxLAD--recommended medical management with aggressive BP control Diabetes mellitus, type II Hyperlipidemia H/o CVA:Rt Cerebellar hemorrhage 1/07 Chronic Kidney disease:Stage 3                                       Secondary to poorly controlled HTN                                       Baseline creatinine 2.0-2.2                                       Followed @[facility] ,Dr.G[place]borough Paroxysmal Atrial Fibrillation:2D ECHO(5/07):LVef 60-70%,mild aortic root dilatation,LA mod dilated, Off anticoagulation sec to cerebellar hemorrhage in 07/2005 H/o Bradycardia:with mild pause 1.2sec while on beta blocker 1/07. EP:No pacer needed.No evidence of SAN dysfunction Depression Gout:First episode 11/07;Hospitalized Mildly enlarged prostate on Renal U/S 1/07 Sleep apnea  Past surgical history   Family history   History   Social History  . Marital Status: Married    [name]    Number of Children: N/A  . Years of Education: N/A    Occupational History  .    Social History Main Topics  . Smoking status: Former Smoker    Types: Cigarettes    Quit date: 10/26/1976  . Smokeless tobacco: Never Used  . Alcohol Use: No  . Drug Use: No  .        Review of Systems: As per HPI; all other ROS are negative  Physical Exam: T    BP    HR   RR    O2 sat  GEN: HEENT: Lungs: Heart: Abd: Ext: Neuro:   Lab results:  Imaging results:   Other results:  Assessment & Plan by Problem:  1. Altered Mental Status: unclear etiology at this time.  Differential diagnosis include medication induced, stroke/intracranial hemorrhage, infectious etiology such as meningitis, hypertensive encephalopathy, or electrolyte imbalances.  2. Bradycardia:  3. CAD s/p cath in 2005.  Stable  4. Chronic Atrial fibrillation: currently on Coumadin at home.    5. CKD stage III: Baseline Cr 2-2.2.  Current Cr 2.1, will continue to monitor with BMET.  6. Hypertension:  7. DM type II: uncontrolled 2/2 history of medication non-compliance.

## 2010-11-22 NOTE — H&P (Signed)
Hospital Admission Note Date: 11/22/2010  Patient name: Patrick [name] Medical record number: [id number] Date of birth: September 18, 1945 Age: 65 y.o. Gender: male PCP: PRIBULA,[name], MD  Medical Service: Internal Medicine Teaching Service  Attending physician:  Dr. Lars Mage     Resident [phone]): Dr. Kelton Pillar     Pager: [phone] Resident (R1): Dr. Obie Dredge     Pager: [phone]  Chief Complaint: Altered mental status  History of Present Illness:  65 yr old man with pmhx Atrial Fibrillation on coumadin, uncontrolled DM, HTN, Hyperlipidemia, Nonobstructive CAD, hx of bradycardia was transferred from [facility] for [place]. History was provided by wife. Reports that for the last 2-3 weeks patient has been slower than usual to respond verbally. She noticed that Monday he was sleeping more than usual. On Tuesday she noticed he was walking slower. Patient when asked about his walking responded by telling his wife that he was feeling weak and didn't feel good but gave no other details. Patient also started to be incoherent, had slurred speech, and was confused. Patient in [place] was given to doses of atropine as HR 30 brought HR to 50's. Denies chest pain, shortness of breath, diaphoresis, palpitations, abdominal pain, n/v/d, fever/chills, headache, blurry vision, or cough.   Allergies: NKDA  Medications: Current Outpatient Prescriptions on File Prior to Visit  Medication Sig Dispense Refill  . aspirin 81 MG EC tablet Take 81 mg by mouth daily.        . benazepril (LOTENSIN) 40 MG tablet Take 80 mg by mouth daily.        . Blood Glucose Monitoring Suppl (ACCU-CHEK AVIVA PLUS) W/DEVICE KIT 1 each by Does not apply route as needed.  1 kit  0  . cloNIDine (CATAPRES) 0.3 MG tablet Take 0.3 mg by mouth 2 (two) times daily. For blood pressure       . diltiazem (CARDIZEM) 90 MG tablet Take 180 mg by mouth 2 (two) times daily.        . famotidine (PEPCID) 20 MG tablet Take 2 tablets (40 mg total)  by mouth daily.  31 tablet  3  . glipiZIDE (GLUCOTROL) 10 MG tablet Take 15 mg by mouth 2 (two) times daily.        Marland Kitchen glucose blood (ACCU-CHEK ADVANTAGE TEST) test strip Use as instructed  100 each  12  . glucose blood (TRUETRACK TEST) test strip Use to test blood sugar 2-3 times daily       . insulin NPH (NOVOLIN N) 100 UNIT/ML injection Inject 25 Units into the skin at bedtime.  10 mL  6  . Insulin Syringe-Needle U-100 (ELITE-THIN INS SYR .5CC/31G) 31G X 5/16" 0.5 ML MISC Use to inject insulin daily       . Lancets (ACCU-CHEK MULTICLIX) lancets Use as instructed  100 each  12  . Lancets 30G MISC Use to test blood glucose 2-3 times daily       . Omega-3 Fatty Acids (FISH OIL) 1000 MG CAPS Take 1 capsule by mouth 2 (two) times daily.        . pravastatin (PRAVACHOL) 40 MG tablet Take 2 tablets (80 mg total) by mouth daily.  60 tablet  6  . warfarin (COUMADIN) 5 MG tablet Take 5 mg by mouth as directed.          Past Medical History: Hypertension:Uncontrolled                       H/o medical  noncompliance                       Hospitalised*2 for HTN urgency                       w/u for refractory HTN neg incl RAS(neg MRA), neg renal/aorta ultrasound in 03/2003 Non-obstructive [id number] cath 9/05 by [facility]                                     20-30%proxRCA,30%proxLAD--recommended medical management with aggressive BP control Diabetes mellitus, type II Hyperlipidemia H/o CVA:Rt Cerebellar hemorrhage 1/07 Chronic Kidney disease:Stage 3                                       Secondary to poorly controlled HTN                                       Baseline creatinine 2.0-2.2                                       Followed @[facility] ,Dr.[place] Paroxysmal Atrial Fibrillation:2D ECHO(5/07):LVef 60-70%,mild aortic root dilatation,LA mod dilated, H/o Bradycardia:with mild pause 1.2sec while on beta blocker 1/07. EP:No pacer needed.No evidence of SAN  dysfunction Depression Gout:First episode 11/07;Hospitalized Mildly enlarged prostate on Renal U/S 1/07 Sleep apnea   Family history: The patient's mother died at age 66. The patient's father died at age 26, with a heart attack.   Social History: The patient is married.  He has four children.  He lives in [place].  He is employed as a Presenter, broadcasting.  He denies tobacco, ethanol or drug use.   Review of Systems: All 12 point system reviewed otherwise negative per HPI.  Physical Exam: T    BP    HR   RR    O2 sat  GEN: Confused, incoherent, NAD HEENT: EOMI, PERRLA, MMM, No JVD, Pharynx nonerythematous, no exudates Lungs:CTA Bilaterally anteriorly, no wheezes, rhonchi or crackles Heart:: Brady irregular, No murmurs, rubs, or gallops Abd:BS +, Soft, NT/ND/NR/NG Ext: no edema or cyanosis Neuro: A&OX2 (to person and place), right sided droop, 5/5 strength throughout, sensation intact, cerebellar intact,    Lab results:  Imaging results:     Assessment & Plan by Problem:  1. Altered Mental Status: Most likely 2/2 stroke/intracranial hemorrhage. Admitted to ICU. Stroke order set used. Will get full stroke workup which include MRI  2. Bradycardia: Patient maintaining saturation and BP. Cardiology consulted for further evaluation. Appreciate recommendations.  3. CAD s/p cath in 2005.  Stable. Cycle cardiac enzymes. Continue aspirin/statin.   4. Chronic Atrial fibrillation: currently on Coumadin. Hold diltiazem for now.  5. CKD stage III: Baseline Cr 2-2.2.  Current Cr 2.1, will continue to monitor with BMET.  6. Hypertension: Hold oral antihypertensives for now. If BP> 200/120 will give prn IV hydralazine. Will not lower MAP more than 25% in the next 24 hrs.   7. DM type II: Placed on ICU hyperglycemia protocol.  8. Sleep apnea: Continue CPAP per respiratory.  9. DVT ppx: coumadin    Miamarie Moll V[place],  [phone]________________________  ATTENDING:  I performed and/or observed a history and physical examination of the patient.  I discussed the case with the residents as noted and reviewed the residents' notes.  I agree with the findings and plan--please refer to the attending physician note for more details.   Signature________________________________  Printed Name_____________________________

## 2010-11-23 ENCOUNTER — Inpatient Hospital Stay (HOSPITAL_COMMUNITY): Payer: Medicare Other

## 2010-11-23 LAB — CBC
HCT: 42.9 % (ref 39.0–52.0)
MCH: 25.9 pg — ABNORMAL LOW (ref 26.0–34.0)
MCHC: 33.3 g/dL (ref 30.0–36.0)
MCV: 77.6 fL — ABNORMAL LOW (ref 78.0–100.0)
RDW: 13.3 % (ref 11.5–15.5)

## 2010-11-23 LAB — BASIC METABOLIC PANEL
BUN: 18 mg/dL (ref 6–23)
Calcium: 9 mg/dL (ref 8.4–10.5)
GFR calc non Af Amer: 37 mL/min — ABNORMAL LOW (ref >60)
Glucose, Bld: 194 mg/dL — ABNORMAL HIGH (ref 70–99)

## 2010-11-23 LAB — GLUCOSE, CAPILLARY
Glucose-Capillary: 219 mg/dL — ABNORMAL HIGH (ref 70–99)
Glucose-Capillary: 210 mg/dL — ABNORMAL HIGH (ref 70–99)

## 2010-11-23 LAB — LIPID PANEL
Cholesterol: 146 mg/dL (ref 0–200)
HDL: 46 mg/dL (ref >39)
LDL Cholesterol: 73 mg/dL (ref 0–99)
Total CHOL/HDL Ratio: 3.2 RATIO

## 2010-11-24 ENCOUNTER — Inpatient Hospital Stay (HOSPITAL_COMMUNITY): Payer: Medicare Other

## 2010-11-24 DIAGNOSIS — I4891 Unspecified atrial fibrillation: Secondary | ICD-10-CM

## 2010-11-24 DIAGNOSIS — R4182 Altered mental status, unspecified: Secondary | ICD-10-CM

## 2010-11-24 LAB — CBC
HCT: 43.1 % (ref 39.0–52.0)
MCH: 26.6 pg (ref 26.0–34.0)
MCHC: 33.9 g/dL (ref 30.0–36.0)
RDW: 13.3 % (ref 11.5–15.5)

## 2010-11-24 LAB — BASIC METABOLIC PANEL
BUN: 19 mg/dL (ref 6–23)
Calcium: 9.3 mg/dL (ref 8.4–10.5)
Creatinine, Ser: 1.79 mg/dL — ABNORMAL HIGH (ref 0.4–1.5)
GFR calc non Af Amer: 39 mL/min — ABNORMAL LOW (ref >60)
Glucose, Bld: 227 mg/dL — ABNORMAL HIGH (ref 70–99)
Sodium: 138 mEq/L (ref 135–145)

## 2010-11-24 LAB — PROTIME-INR
INR: 1.38 (ref 0.00–1.49)
Prothrombin Time: 18.3 seconds — ABNORMAL HIGH (ref 11.6–15.2)
Prothrombin Time: 17.2 seconds — ABNORMAL HIGH (ref 11.6–15.2)

## 2010-11-24 LAB — GLUCOSE, CAPILLARY: Glucose-Capillary: 201 mg/dL — ABNORMAL HIGH (ref 70–99)

## 2010-11-24 MED ORDER — IOHEXOL 300 MG/ML  SOLN
150.0000 mL | Freq: Once | INTRAMUSCULAR | Status: AC | PRN
  Administered 2010-11-24: 10 mL via INTRA_ARTERIAL

## 2010-11-25 LAB — CBC
Platelets: 228 10*3/uL (ref 150–400)
RBC: 5.64 MIL/uL (ref 4.22–5.81)
RDW: 13.3 % (ref 11.5–15.5)
WBC: 6.6 10*3/uL (ref 4.0–10.5)

## 2010-11-25 LAB — HEPARIN LEVEL (UNFRACTIONATED)
Heparin Unfractionated: 0.12 IU/mL — ABNORMAL LOW (ref 0.30–0.70)
Heparin Unfractionated: 0.37 IU/mL (ref 0.30–0.70)

## 2010-11-25 LAB — GLUCOSE, CAPILLARY
Glucose-Capillary: 185 mg/dL — ABNORMAL HIGH (ref 70–99)
Glucose-Capillary: 195 mg/dL — ABNORMAL HIGH (ref 70–99)
Glucose-Capillary: 105 mg/dL — ABNORMAL HIGH (ref 70–99)

## 2010-11-25 LAB — BASIC METABOLIC PANEL
BUN: 19 mg/dL (ref 6–23)
Calcium: 9.3 mg/dL (ref 8.4–10.5)
Creatinine, Ser: 1.77 mg/dL — ABNORMAL HIGH (ref 0.4–1.5)
GFR calc non Af Amer: 39 mL/min — ABNORMAL LOW (ref >60)

## 2010-11-25 LAB — PROTIME-INR: INR: 1.22 (ref 0.00–1.49)

## 2010-11-26 LAB — GLUCOSE, CAPILLARY: Glucose-Capillary: 225 mg/dL — ABNORMAL HIGH (ref 70–99)

## 2010-11-26 LAB — CBC
HCT: 41.8 % (ref 39.0–52.0)
Hemoglobin: 13.9 g/dL (ref 13.0–17.0)
RBC: 5.35 MIL/uL (ref 4.22–5.81)
RDW: 13.4 % (ref 11.5–15.5)
WBC: 5.9 10*3/uL (ref 4.0–10.5)

## 2010-11-26 LAB — BASIC METABOLIC PANEL
CO2: 22 mEq/L (ref 19–32)
Calcium: 8.8 mg/dL (ref 8.4–10.5)
GFR calc Af Amer: 47 mL/min — ABNORMAL LOW (ref >60)
Sodium: 138 mEq/L (ref 135–145)

## 2010-11-27 ENCOUNTER — Inpatient Hospital Stay (HOSPITAL_COMMUNITY): Payer: Medicare Other

## 2010-11-27 DIAGNOSIS — R4182 Altered mental status, unspecified: Secondary | ICD-10-CM

## 2010-11-27 LAB — BASIC METABOLIC PANEL
CO2: 20 mEq/L (ref 19–32)
Calcium: 9.2 mg/dL (ref 8.4–10.5)
Creatinine, Ser: 1.79 mg/dL — ABNORMAL HIGH (ref 0.4–1.5)
GFR calc Af Amer: 47 mL/min — ABNORMAL LOW (ref >60)
Sodium: 134 mEq/L — ABNORMAL LOW (ref 135–145)

## 2010-11-27 LAB — PROTIME-INR
INR: 1 (ref 0.00–1.49)
Prothrombin Time: 13.4 seconds (ref 11.6–15.2)

## 2010-11-27 LAB — CBC
Hemoglobin: 14.7 g/dL (ref 13.0–17.0)
Platelets: 239 10*3/uL (ref 150–400)
RBC: 5.55 MIL/uL (ref 4.22–5.81)
WBC: 8.7 10*3/uL (ref 4.0–10.5)

## 2010-11-27 LAB — HEPARIN LEVEL (UNFRACTIONATED): Heparin Unfractionated: 0.45 IU/mL (ref 0.30–0.70)

## 2010-11-27 LAB — GLUCOSE, CAPILLARY: Glucose-Capillary: 212 mg/dL — ABNORMAL HIGH (ref 70–99)

## 2010-11-27 MED ORDER — IOHEXOL 300 MG/ML  SOLN
250.0000 mL | Freq: Once | INTRAMUSCULAR | Status: AC | PRN
  Administered 2010-11-27: 90 mL via INTRAVENOUS

## 2010-11-28 LAB — BASIC METABOLIC PANEL
BUN: 31 mg/dL — ABNORMAL HIGH (ref 6–23)
Calcium: 9.5 mg/dL (ref 8.4–10.5)
GFR calc non Af Amer: 32 mL/min — ABNORMAL LOW (ref >60)
Glucose, Bld: 222 mg/dL — ABNORMAL HIGH (ref 70–99)
Sodium: 138 mEq/L (ref 135–145)

## 2010-11-28 LAB — PROTIME-INR: Prothrombin Time: 13.4 seconds (ref 11.6–15.2)

## 2010-11-28 LAB — GLUCOSE, CAPILLARY: Glucose-Capillary: 324 mg/dL — ABNORMAL HIGH (ref 70–99)

## 2010-11-28 LAB — HEPARIN LEVEL (UNFRACTIONATED): Heparin Unfractionated: 0.54 IU/mL (ref 0.30–0.70)

## 2010-11-28 LAB — CBC
Hemoglobin: 14.7 g/dL (ref 13.0–17.0)
MCHC: 33.2 g/dL (ref 30.0–36.0)
RBC: 5.62 MIL/uL (ref 4.22–5.81)

## 2010-11-29 DIAGNOSIS — R4182 Altered mental status, unspecified: Secondary | ICD-10-CM

## 2010-11-29 LAB — GLUCOSE, CAPILLARY
Glucose-Capillary: 188 mg/dL — ABNORMAL HIGH (ref 70–99)
Glucose-Capillary: 316 mg/dL — ABNORMAL HIGH (ref 70–99)
Glucose-Capillary: 147 mg/dL — ABNORMAL HIGH (ref 70–99)

## 2010-11-29 LAB — CBC
HCT: 42.5 % (ref 39.0–52.0)
Hemoglobin: 14 g/dL (ref 13.0–17.0)
MCH: 26.4 pg (ref 26.0–34.0)
MCV: 80 fL (ref 78.0–100.0)
RBC: 5.31 MIL/uL (ref 4.22–5.81)

## 2010-11-29 LAB — BASIC METABOLIC PANEL
Calcium: 9.3 mg/dL (ref 8.4–10.5)
Calcium: 9.4 mg/dL (ref 8.4–10.5)
GFR calc Af Amer: 37 mL/min — ABNORMAL LOW (ref >60)
GFR calc non Af Amer: 31 mL/min — ABNORMAL LOW (ref >60)
GFR calc non Af Amer: 31 mL/min — ABNORMAL LOW (ref >60)
Potassium: 4.3 mEq/L (ref 3.5–5.1)
Sodium: 139 mEq/L (ref 135–145)
Sodium: 136 mEq/L (ref 135–145)

## 2010-11-30 LAB — HEPARIN LEVEL (UNFRACTIONATED): Heparin Unfractionated: 0.4 IU/mL (ref 0.30–0.70)

## 2010-11-30 LAB — BASIC METABOLIC PANEL
BUN: 44 mg/dL — ABNORMAL HIGH (ref 6–23)
CO2: 25 mEq/L (ref 19–32)
Chloride: 102 mEq/L (ref 96–112)
Creatinine, Ser: 2.31 mg/dL — ABNORMAL HIGH (ref 0.4–1.5)
GFR calc Af Amer: 35 mL/min — ABNORMAL LOW (ref >60)

## 2010-11-30 LAB — PROTIME-INR: INR: 1.19 (ref 0.00–1.49)

## 2010-11-30 LAB — GLUCOSE, CAPILLARY

## 2010-11-30 LAB — CBC
HCT: 40.5 % (ref 39.0–52.0)
Hemoglobin: 13.2 g/dL (ref 13.0–17.0)
RBC: 5.1 MIL/uL (ref 4.22–5.81)
WBC: 10.2 10*3/uL (ref 4.0–10.5)

## 2010-12-04 ENCOUNTER — Ambulatory Visit (INDEPENDENT_AMBULATORY_CARE_PROVIDER_SITE_OTHER): Payer: Medicare Other | Admitting: Pharmacist

## 2010-12-04 DIAGNOSIS — Z7901 Long term (current) use of anticoagulants: Secondary | ICD-10-CM

## 2010-12-04 DIAGNOSIS — I4891 Unspecified atrial fibrillation: Secondary | ICD-10-CM

## 2010-12-04 LAB — POCT INR: INR: 1.8

## 2010-12-04 NOTE — Patient Instructions (Signed)
Patient instructed to take medications as defined in the Anti-coagulation Track section of this encounter.  Patient instructed to take today's dose.  Patient verbalized understanding of these instructions.    

## 2010-12-04 NOTE — Progress Notes (Signed)
Mr. Perales' history was reviewed.  Apparently he has atrial fibrillation, hence the indication for life-long anticoagulation.  I agree with Dr. Gladstone Pih assessment and plan regarding the anticoagulation.

## 2010-12-04 NOTE — Progress Notes (Signed)
Anti-Coagulation Progress Note  Mavryk Kice is a 65 y.o. male who is currently on an anti-coagulation regimen.    RECENT RESULTS: Recent results are below, the most recent result is correlated with a dose of warfarin as administered in hospital. Lab Results  Component Value Date   INR 1.8 12/04/2010   INR 1.19 11/30/2010   INR 1.06 11/29/2010    ANTI-COAG DOSE:   Latest dosing instructions   Total Sun Mon Tue Wed Thu Fri Sat   45 5 mg 7.5 mg 7.5 mg 5 mg 7.5 mg 5 mg 7.5 mg    (5 mg1) (5 mg1.5) (5 mg1.5) (5 mg1) (5 mg1.5) (5 mg1) (5 mg1.5)         ANTICOAG SUMMARY: Anticoagulation Episode Summary              Current INR goal 2.0-3.0 Next INR check 12/11/2010   INR from last check 1.8! (12/04/2010)     Weekly max dose (mg)  Target end date Indefinite   Indications ATRIAL FIBRILLATION, PAROXYSMAL, CHRONIC, Long term current use of anticoagulant   INR check location  Preferred lab    Send INR reminders to [facility]   Comments        Provider Role Specialty Phone number   Larey Dresser  Internal Medicine [phone]        ANTICOAG TODAY: Anticoagulation Summary as of 12/04/2010              INR goal 2.0-3.0     Selected INR 1.8! (12/04/2010) Next INR check 12/11/2010   Weekly max dose (mg)  Target end date Indefinite   Indications ATRIAL FIBRILLATION, PAROXYSMAL, CHRONIC, Long term current use of anticoagulant    Anticoagulation Episode Summary              INR check location  Preferred lab    Send INR reminders to [facility]   Comments        Provider Role Specialty Phone number   Larey Dresser  Internal Medicine [phone]        PATIENT INSTRUCTIONS: Patient Instructions  Patient instructed to take medications as defined in the Anti-coagulation Track section of this encounter.  Patient instructed to take today's dose.  Patient verbalized understanding of these instructions.        FOLLOW-UP Return in 7 days (on 12/11/2010) for Follow up INR  and physician's appointment.[name], III Pharm.D., CACP

## 2010-12-09 ENCOUNTER — Encounter: Payer: Self-pay | Admitting: Internal Medicine

## 2010-12-09 NOTE — Progress Notes (Signed)
Discharge update: 66 y/o man presented with PMH significant for atrial fibrillation on coumadin was transferred from [facility] ER for altered mental status. He had CT head and MRI that showed Left thalamic infarct and was also noted to be atrial fibrillation with slow ventricular response( bradycardia)at admission. His MRA was suspicious for anterior and posterior circulation atherosclerosis for which he got carotid arteriogram that showed about 50% basilar artery stenosis and 50 % in PCA- does not need any intervention.  His BP was elevated during his hospital stay. Discharged home on benazepril and clonidine. He was also started on lasix but that was held in the setting of rising Cr. The rise in creatinine was seen in the setting of steroid use( short taper for gout), contrast use for the procedure. Cr at discharge was up from his baseline (2.1- 2.2). His antihypertensive and diabetic regimen needs to be readjusted.  LABS to follow: BMET  Medications: Reviewed  Problem list: Reviewed.

## 2010-12-11 ENCOUNTER — Ambulatory Visit (INDEPENDENT_AMBULATORY_CARE_PROVIDER_SITE_OTHER): Payer: Medicare Other | Admitting: Pharmacist

## 2010-12-11 ENCOUNTER — Ambulatory Visit (INDEPENDENT_AMBULATORY_CARE_PROVIDER_SITE_OTHER): Payer: Medicare Other | Admitting: Internal Medicine

## 2010-12-11 DIAGNOSIS — I4891 Unspecified atrial fibrillation: Secondary | ICD-10-CM

## 2010-12-11 DIAGNOSIS — Z7901 Long term (current) use of anticoagulants: Secondary | ICD-10-CM

## 2010-12-11 DIAGNOSIS — I6350 Cerebral infarction due to unspecified occlusion or stenosis of unspecified cerebral artery: Secondary | ICD-10-CM

## 2010-12-11 DIAGNOSIS — I10 Essential (primary) hypertension: Secondary | ICD-10-CM

## 2010-12-11 DIAGNOSIS — I6381 Other cerebral infarction due to occlusion or stenosis of small artery: Secondary | ICD-10-CM | POA: Insufficient documentation

## 2010-12-11 DIAGNOSIS — E785 Hyperlipidemia, unspecified: Secondary | ICD-10-CM

## 2010-12-11 DIAGNOSIS — G4730 Sleep apnea, unspecified: Secondary | ICD-10-CM

## 2010-12-11 DIAGNOSIS — I6322 Cerebral infarction due to unspecified occlusion or stenosis of basilar arteries: Secondary | ICD-10-CM | POA: Insufficient documentation

## 2010-12-11 DIAGNOSIS — E119 Type 2 diabetes mellitus without complications: Secondary | ICD-10-CM

## 2010-12-11 DIAGNOSIS — I63219 Cerebral infarction due to unspecified occlusion or stenosis of unspecified vertebral arteries: Secondary | ICD-10-CM

## 2010-12-11 MED ORDER — BENAZEPRIL HCL 40 MG PO TABS: 80.0000 mg | ORAL_TABLET | Freq: Every day | ORAL | Status: DC

## 2010-12-11 MED ORDER — DILTIAZEM HCL 90 MG PO TABS: 180.0000 mg | ORAL_TABLET | Freq: Two times a day (BID) | ORAL | Status: DC

## 2010-12-11 MED ORDER — CLONIDINE HCL 0.2 MG PO TABS: 0.2000 mg | ORAL_TABLET | Freq: Two times a day (BID) | ORAL | Status: DC

## 2010-12-11 MED ORDER — GLIPIZIDE 10 MG PO TABS: 15.0000 mg | ORAL_TABLET | Freq: Two times a day (BID) | ORAL | Status: DC

## 2010-12-11 MED ORDER — DILTIAZEM HCL 90 MG PO TABS: 90.0000 mg | ORAL_TABLET | Freq: Two times a day (BID) | ORAL | Status: DC

## 2010-12-11 MED ORDER — DILTIAZEM HCL 60 MG PO TABS: 60.0000 mg | ORAL_TABLET | Freq: Two times a day (BID) | ORAL | Status: DC

## 2010-12-11 MED ORDER — PRAVASTATIN SODIUM 40 MG PO TABS: 80.0000 mg | ORAL_TABLET | Freq: Every day | ORAL | Status: DC

## 2010-12-11 NOTE — Progress Notes (Signed)
Anti-Coagulation Progress Note  Knox Gantner is a 65 y.o. male who is currently on an anti-coagulation regimen.    RECENT RESULTS: Recent results are below, the most recent result is correlated with a dose of 45 mg. per week: Lab Results  Component Value Date   INR 2.8 12/11/2010   INR 1.8 12/04/2010   INR 1.19 11/30/2010    ANTI-COAG DOSE:   Latest dosing instructions   Total Sun Mon Tue Wed Thu Fri Sat   45 5 mg 7.5 mg 7.5 mg 5 mg 7.5 mg 5 mg 7.5 mg    (5 mg1) (5 mg1.5) (5 mg1.5) (5 mg1) (5 mg1.5) (5 mg1) (5 mg1.5)         ANTICOAG SUMMARY: Anticoagulation Episode Summary              Current INR goal 2.0-3.0 Next INR check 12/26/2010   INR from last check 2.8 (12/11/2010)     Weekly max dose (mg)  Target end date Indefinite   Indications ATRIAL FIBRILLATION, PAROXYSMAL, CHRONIC, Long term current use of anticoagulant   INR check location  Preferred lab    Send INR reminders to [facility]   Comments        Provider Role Specialty Phone number   Larey Dresser  Internal Medicine [phone]        ANTICOAG TODAY: Anticoagulation Summary as of 12/11/2010              INR goal 2.0-3.0     Selected INR 2.8 (12/11/2010) Next INR check 12/26/2010   Weekly max dose (mg)  Target end date Indefinite   Indications ATRIAL FIBRILLATION, PAROXYSMAL, CHRONIC, Long term current use of anticoagulant    Anticoagulation Episode Summary              INR check location  Preferred lab    Send INR reminders to [facility]   Comments        Provider Role Specialty Phone number   Larey Dresser  Internal Medicine [phone]        PATIENT INSTRUCTIONS: Patient Instructions  Patient instructed to take medications as defined in the Anti-coagulation Track section of this encounter.  Patient instructed to take today's dose.  Patient verbalized understanding of these instructions.        FOLLOW-UP Return in 2 weeks (on 12/26/2010) for Follow up INR.  [name],  III Pharm.D., CACP

## 2010-12-11 NOTE — Progress Notes (Signed)
  Subjective:    Patient ID: Patrick [name], male    DOB: [birth year], 65 y.o.   MRN: [id number]  HPI  Mr. Swaringen is a 65 year old man who presents today for follow up from his hospitalization.  Unfortunately he suffered a stroke and was hospitalized.  Since discharge he and his wife have been continuing to watch his diabetes, hypertension, and work with the rehabillitation services to help him recover after the stroke.  He has only slight motor deficits but has moderate aphasia and memory problems.    He was discharged home on a slightly different medication regimen including decreasing his clonidine and discontinuing his Lasix.  His diabetes regimen was not changed in the hospital.  He was already on Coumadin for stroke prevention and that will continue.  Because of the discontinuation of his lasix he will need a Bmet done today as well as a follow up on his blood sugar and blood pressure.    Also noted on admission was a slow ventricular response.  His diltiazem was originally held and then he developed atrial fibrillation with RVR so it was started back at a lower dose.    Review of Systems    Constitutional: Denies fever, chills, diaphoresis, appetite change and fatigue.  HEENT: Denies photophobia, eye pain, redness, hearing loss, ear pain, congestion, sore throat, rhinorrhea, sneezing, mouth sores, trouble swallowing, neck pain, neck stiffness and tinnitus.   Respiratory: Denies SOB, DOE, cough, chest tightness,  and wheezing.   Cardiovascular: Denies chest pain, palpitations and leg swelling.  Gastrointestinal: Denies nausea, vomiting, abdominal pain, diarrhea, constipation, blood in stool and abdominal distention.  Genitourinary: Denies dysuria, urgency, frequency, hematuria, flank pain and difficulty urinating.  Musculoskeletal: Denies myalgias, back pain, joint swelling, arthralgias and gait problem.  Skin: Denies pallor, rash and wound.  Neurological: Denies dizziness, seizures, syncope,  weakness, light-headedness, numbness and headaches.  Hematological: Denies adenopathy. Easy bruising, personal or family bleeding history  Psychiatric/Behavioral: Denies suicidal ideation, mood changes, confusion, nervousness, sleep disturbance and agitation  Objective:   Physical Exam    Constitutional: Vital signs reviewed.  Patient is a well-developed and well-nourished man in no acute distress and cooperative with exam. Alert and oriented x3. Speech moderately delayed and has to be prompted to say more then yes or no.  Head: Normocephalic and atraumatic Ear: TM normal bilaterally Mouth: no erythema or exudates, MMM Eyes: PERRL, EOMI, conjunctivae normal, No scleral icterus.  Neck: Supple, Trachea midline normal ROM, No JVD, mass, thyromegaly, or carotid bruit present.  Cardiovascular: IRR, S1 normal, S2 normal, no MRG, pulses symmetric and intact bilaterally Pulmonary/Chest: CTAB, no wheezes, rales, or rhonchi Abdominal: Soft. Non-tender, non-distended, bowel sounds are normal, no masses, organomegaly, or guarding present.  GU: no CVA tenderness Musculoskeletal: No joint deformities, erythema, or stiffness, ROM full and no nontender Hematology: no cervical, inginal, or axillary adenopathy.  Neurological: A&O x3, Strength is symmetric bilaterally and mildly diminished , cranial nerve II-XII are grossly intact, no focal motor deficit, sensory intact to light touch bilaterally.  Skin: Warm, dry and intact. No rash, cyanosis, or clubbing.  Psychiatric: Flat affect. speech is slow but coherent. Behavior is normal. Judgment and thought content normal. Cognition and memory are normal.    Assessment & Plan:

## 2010-12-11 NOTE — Patient Instructions (Signed)
Patient instructed to take medications as defined in the Anti-coagulation Track section of this encounter.  Patient instructed to take today's dose.  Patient verbalized understanding of these instructions.    

## 2010-12-11 NOTE — Patient Instructions (Addendum)
Change Diltiazem to Diltiazem 150 mg twice daily.  This will be one 90 mg pill and one 60 mg pill twice daily.    Keep taking your medications as prescribed.   Please call [facility] to get your CPAP machine and wear it every night!  If it is not comfortable please call them to try to find a mask that is comfortable.    Follow up in 2 weeks for a blood pressure and pulse recheck.

## 2010-12-12 LAB — BASIC METABOLIC PANEL
BUN: 33 mg/dL — ABNORMAL HIGH (ref 6–23)
Chloride: 105 mEq/L (ref 96–112)
Potassium: 4.6 mEq/L (ref 3.5–5.3)

## 2010-12-12 NOTE — Progress Notes (Signed)
Called to pharmacy 

## 2010-12-13 ENCOUNTER — Other Ambulatory Visit: Payer: Self-pay | Admitting: Internal Medicine

## 2010-12-15 NOTE — Discharge Summary (Signed)
NAME:  NEYLAN, LANDFAIR                ACCOUNT NO.:  [id number]   MEDICAL RECORD NO.:  [id number]          PATIENT TYPE:  INP   LOCATION:  [id number]                         FACILITY:  [place]   PHYSICIAN:  Doroteo Bradford. Johnnye Sima, M.D.DATE OF BIRTH:  [birth year]   DATE OF ADMISSION:  08/09/2005  DATE OF DISCHARGE:  08/24/2005                                 DISCHARGE SUMMARY   DISCHARGE DIAGNOSES:  1.  Right-sided cerebellar hemorrhage, stable on last CT scan done on      August 17, 2005.  2.  Uncontrolled hypertension.  3.  History of hypertensive emergencies x2 in the past, last one in November      2005.  Work-up for secondary hypertension including renal artery      stenosis negative.  Negative MRA in September 2004.  4.  History of noncompliance with medications secondary to medication side      effects.  5.  Paroxysmal atrial fibrillation last documented in 2004, rate controlled.      Not on anticoagulation this admission secondary to cerebellar      hemorrhage.  6.  Non-obstructive coronary artery disease/catheterization in September      2005 showing 20-30% stenosis of proximal right coronary artery, 30%      stenosis of the proximal left anterior descending, overall non-critical      cardiac disease.  7.  2-D echocardiogram last done in September 2005 showed overall ejection      fraction 65-75%, left ventricular thickness moderately markedly      increased.  8.  Type 2 diabetes mellitus.  9.  History of depression.  10. Chronic renal insufficiency, borderline.  Baseline creatinine 1.8-2.      Follow up with [facility] as an outpatient.  11. Hypokalemia secondary to diuretics.  12. Bradycardia with mild positive 1.2 seconds secondary to beta blocker.      Cleared off of the beta blocker.  Cardiology consult.  EP consult done.      Does not require pacer.  No evidence of SA node dysfunction.  13. Hyperlipidemia.   DISCHARGE MEDICATIONS:  1.  He was discharged home on  Norvasc 10 mg p.o. b.i.d.  2.  Lisinopril 40 mg p.o. b.i.d.  3.  Lasix 40 mg p.o. daily.  4.  Clonidine 0.1 p.o. b.i.d.  5.  Lopressor 25 p.o. b.i.d.  6.  Hectorol 0.5 mcg p.o. daily.  7.  Potassium chloride 20 mEq p.o. daily.   DISPOSITION AND FOLLOW-UP:  1.  Patient has been discharged home in great condition to follow up with      Dr. Ander Purpura outpatient clinic on August 27, 2005 at 3 p.m. and [facility].  At that time need to follow up on his      blood pressure on increased dose of lisinopril and other multiple      medications.  2.  Need to check a BMET stat to follow up on a creatinine on increased dose      of lisinopril and follow up on his potassium level on Lasix.  3.  Need to address if patient is adherent to medications.  4.  Need to make sure he follows up with [facility] and also      need to assign PCP, most likely Dr. Garnette Scheuermann for close monitoring of his      blood pressure and medication compliance.  Follow up on his heart rate      as he had bradycardia on higher dose of Lopressor which is resolved on      decreased Lopressor dose to 25 b.i.d.   CONSULTATIONS:  1.  Patient was primarily admitted by neurosurgery, Dr. Arnoldo Morale, for right      cerebellar bleed and medical teaching service has been called for      consultation for management of his chronic issues including blood      pressure management and renal insufficiency and atrial fibrillation,      etc.  2.  Patient has been transferred to teaching service and renal consult was      done by [facility]  Dr. Moshe Cipro saw the patient.  3.  Cardiology EP evaluation by Dr. Reyes Ivan   PROCEDURES:  No procedures were done during this admission.   IMAGING:  1.  CT scan of the head at the time of admission on August 09, 2005 showed      right central cerebellar hemispheric bleed with edema on mass effect      upon the fourth ventricle.  No  hydrocephalus at that time.  Repeat CT      last on January 19 showed improving hemorrhage, minimal decrease in the      size of the previous edema demonstrated hemorrhage.  Previously measured      3.4 x 2.5 cm, now come down to 3.4 x 2.2 cm.  2.  Renal ultrasound was negative for any signs of obstruction.  Showed      medical renal disease bilaterally.   HISTORY OF PRESENT ILLNESS:  Mr. Ainsley Beaverson is a 65 year old male patient.  Past medical history significant for uncontrolled hypertension with previous  admission secondary to hypertensive urgency in the past, atrial  fibrillation, type 2 diabetes mellitus, non-obstructive coronary artery  disease, hypertrophic cardiomyopathy secondary to hypertension who was  admitted to neurosurgery on August 17, 2005 with complaints of severe  headache and sensation of poor balance and was diagnosed with right cerebral  hemorrhage on CT scan and was followed up by Dr. Arnoldo Morale.  Medical teaching  service has been called for consultation for his management of chronic  medical issues and blood pressure which was crucial at the time since given  his recent bleed, a systolic to be maintaining between 140-160.  After  admission he was started on p.r.n. doses of labetalol as well as hydralazine  to keep his blood pressure under control.  Otherwise, at the time of  admission he did not have any complaints of weakness of arms or legs or  notable changes in vision.  He complained of occasional chest pain,  exertional, with shortness of breath.  No change recently.  Has not seen his  primary care physician since several months who was Dr. Lanney Gins in  [facility] last seen probably in January 2006.   PHYSICAL EXAMINATION:  VITAL SIGNS:  Temperature 98.3, pulse 117  ____________, respiratory rate 24, blood pressure 195/141 down to 159/103.  GENERAL:  He was in fair condition lying down in bed, mildly uncomfortable. HEENT:  Pupils  were  equal, reacting to light.  Extraocular muscles intact.  Nystagmus present with fall ___________ to left side.  Oropharynx was clear.  CVS:  Rate is irregularly irregular rhythm.  S1, S2 plus.  RESPIRATORY:  Clear to auscultation bilaterally.  Mild decrease breath  sounds in bilateral bases.  ABDOMEN:  Benign.  NEUROLOGIC:  He was oriented to time, place, and person.  Cranial nerves II-  XII intact.  Ocular  muscle testing impaired by nystagmus.  Normal motor  function in all four extremities.  Normal reflexes.  Power 5/5 bilaterally,  symmetrical.  Cerebellar examination could not assess gait or Romberg sign.  Finger-nose test ____________ noted.  Heel to shin:  Patient accomplishes  this with difficulty and when asked to do it slowly has frequent failure.   ADMISSION LABORATORIES:  CBC with hemoglobin of 14.8, white count 6.6,  platelets 181.  BMET with sodium 137, potassium 2.9, chloride 103,  bicarbonate 28, BUN 25, creatinine 2.  PT 14, INR 1.1.  Liver function tests  showed normal alkaline phosphatase 103, AST 23, ALT 21, total protein 7.6,  albumin 4.  Cardiac enzymes within normal limits.  CK 177, CK-MB 2.7,  troponin less than 0.05.  CT of the head as mentioned above showed right  cerebellar hemorrhage.   HOSPITAL COURSE:  PROBLEM 1 - RIGHT CEREBELLAR HEMATOMA:  As mentioned in  the history, patient presented at the time of admission with symptoms of  severe headache and cerebellar signs.  CT scan of the head was done which  was positive for bleed.  Under care of neurosurgery, Dr. Newman Pies, he  was managed conservatively, monitored closely with main issue being blood  pressure control with a goal of systolic between [id number] range with his  heart rate of less than 100.  He was started on p.r.n. labetalol and also  hydralazine at 10 mg q.1h. p.r.n.  His blood pressure management has been  very difficult in spite of getting multiple doses of labetalol and  hydralazine.   We also started him on Norvasc 10 mg daily which could help  him in acute state of hemorrhagic stroke to decrease penumbra or the  ischemic site around the hemorrhage.  He remained pretty stable and his  headache, nausea, vomiting improved over time slowly and his repeat CT done  showed decrease in hematoma.  He did not need any further invasive  intervention regarding this matter.   PROBLEM 2 - UNCONTROLLED HYPERTENSION:  Again, as mentioned, Mr. Thompson Caul  problem is his uncontrolled hypertension.  He had multiple previous  admissions, at least two with hypertensive urgencies in the past, last one  in November 2005 which is most likely secondary to his non-adherence to  medication.  Patient did mention that he had stopped taking most of the  medications secondary to multiple side effects including dizziness.  After  the acute period of hematoma had passed main goal was to start him on  appropriate hypertensive regimen with as minimum medication as possible and to consolidate his total medication list and make sure patient is compliant  with the medication.  His clinic chart was reviewed.  Also, he has work-up  done in the past for possible secondary cause of hypertension including MRA  which was negative for renal artery stenosis.  He was started on Lopressor  for heart rate control since he had atrial fibrillation as well as which  would help with blood pressure.  Also, Norvasc which is increased to 10  b.i.d.  Also on Lasix and ACE inhibitor for his chronic renal insufficiency.  He was also started on p.r.n. clonidine.  In spite of multiple medications  his blood pressure remained pretty difficult to control, systolic being as  high as 160s-170s and as low as 140s to 130s.  His beta blocker dose was  increased to 75 b.i.d. as his pulse tolerated but he started having episodes  of bradycardia with heart rate going down to 35 and with episode of  ____________ so we slowly started cutting  down on his beta blocker and got  it down to 25 b.i.d.  Cardiology was also consulted regarding this matter  and after decreasing the Lopressor to 25 b.i.d. he did not have any further  episodes of bradycardia.  At the time of discharge he was placed on the  medication he was on and his systolic blood pressure remained pretty stable  between 140s-150s on Norvasc 10 b.i.d., Lopressor 25 b.i.d., Lasix 40 daily,  lisinopril 40 b.i.d., and also on clonidine 0.1 b.i.d.  Further future goal  is to decrease and taper off of the clonidine slowly to avoid rebound  hypertension and maintain him on standard minimal medication regimen.  He  will follow in outpatient clinic at that time to recheck his blood pressure  closely and also a BMET to follow up on his creatinine and potassium on  lisinopril.  He needs to be on Lasix along with ACE inhibitor given African-  American race for proper blood pressure maintenance.   PROBLEM 3 - CHRONIC RENAL INSUFFICIENCY:  Baseline creatinine between 1.8-2.  Again, a 24-hour urine protein within nephrotic range most likely secondary  to his uncontrolled hypertension.  While in the hospital renal nephrology  was consulted.  Dr. Moshe Cipro saw the patient and also helped [name] with  maintenance of his blood pressure regimen.  His lisinopril dose was  increased from 20 to 40 daily and then to 40 b.i.d.  He was continued on  increased dose of Lasix at 40 daily.  He will follow up as an outpatient in  [facility] for further management regarding this matter.   PROBLEM 4 - DIABETES MELLITUS TYPE 2:  Pretty well controlled.  He was on  sliding scale insulin Lantus at the time of admission but his sugars always  remain pretty perfect around low 100s.  He was discontinued on Glipizide 10  mg daily and did not have any episodes of hypoglycemia or hyperglycemia so  discharged on the same medication.   PROBLEM 5 - HYPOKALEMIA:  At the time of admission secondary to  poor p.o. intake, nausea, and vomiting and later he was on Lasix which ___________  contributed to hypokalemia.  He was closely monitored on ACE inhibitor and  Lasix, placed on 20 mg daily potassium and will recheck a BMET at the time  of hospital follow-up since given on high-dose of ACE inhibitor concerned  about possible  hyperkalemia.  Might discontinue potassium if his potassium  is stable.   PROBLEM 6 - ATRIAL FIBRILLATION:  Heart rate well controlled on Lopressor 25  b.i.d.  He is not a candidate for anticoagulation currently given his recent  episode of cerebellar hemorrhage at least for six more months.  We need to  discuss further regarding this matter with neurosurgery then he might be a  candidate to start anticoagulation.   PROBLEM 7 - HISTORY OF DEPRESSION:  Chronic, which is most likely  contributing to his medication noncompliance.  His  TSH level was checked  which was normal.  He was offered help with medication or psychotherapy.  Further management regarding this matter will be addressed as an outpatient  with his primary care physician with close follow-up.  Monitor his  depression on beta blocker.   PROBLEM 8 - BRADYCARDIA WITH PAUSE OF 1.2 SECONDS:  Most likely secondary to  beta blocker since after the admission he was started on beta blocker and  this was slowly increased to maintain his blood pressure up to 75 mg b.i.d.  and he started having episodes of bradycardia with heart rate going down to  35.  Later we slowly decreased his Lopressor dose to 25 b.i.d. and he did  not have any episodes of bradycardia after that.  Cardiology was also  consulted for possible evaluation of SA node dysfunction versus sick sinus  syndrome and evaluation was negative.  He did not have any criteria  requiring pacer.  His blood pressure and heart rate will be closely  monitored during his outpatient follow-up.   PROBLEM 9 - HYPERLIPIDEMIA:  He was started on Zocor and will be  discharged  on the same medications.      Judie Bonus, MD    ______________________________  Doroteo Bradford. Johnnye Sima, M.D.    SY/MEDQ  D:  08/27/2005  T:  08/27/2005  Job:  [id number]   cc:   Ophelia Charter, M.D.  Fax: [phone]   Sol Blazing, M.D.  Fax: [phone]   Louis Meckel, M.D.  Fax: [phone]   Alver Fisher, M.D.  Fax: [phone]

## 2010-12-15 NOTE — Discharge Summary (Signed)
Patrick [name], CODDING                ACCOUNT NO.:  [id number]   MEDICAL RECORD NO.:  [id number]          PATIENT TYPE:  INP   LOCATION:  [id number]                         FACILITY:  [place]   PHYSICIAN:  Evette Doffing, M.D.  DATE OF BIRTH:  08/26/1945   DATE OF ADMISSION:  04/24/2004  DATE OF DISCHARGE:  04/27/2004                                 DISCHARGE SUMMARY   DISCHARGE DIAGNOSES:  1.  Uncontrolled hypertension.  2.  Diabetes mellitus.  3.  Chronic renal insufficiency.  4.  Mild depression.   DISCHARGE MEDICATIONS:  1.  Diltiazem 180 mg once daily.  2.  Glipizide 5 mg once daily.  3.  Lotensin 40 mg once daily.  4.  Cardura 1 mg three times a day.  5.  Lasix 40 mg once daily.  6.  Metoprolol 50 mg in the morning and 25 mg at night.  7.  Aspirin 325 mg once daily.  8.  Celexa 20 mg once daily.  9.  Protonix 40 mg once daily.   DISPOSITION:  The patient is discharged home.  Follow-up appointment will be  made with primary medical doctor within a week of discharge.   ACTIVITY:  Ad lib.   DIET:  Recommend ADA.   CONSULTATIONS:  [facility].   PROCEDURE:  1.  April 26, 2004, cardiac catheterization showing increased left      ventricular end diastolic pressure, increased aortic pressure, 20 to 30%      stenosis of the proximal RCA, 30% stenosis of the proximal LAD, overall      no critical cardiac disease.  2.  Chest x-ray on April 24, 2004, showed no acute cardiopulmonary      disease.  3.  Echocardiogram on April 25, 2004, showed left ventricular ejection      fraction of 65 to 73%, hypertrophic left ventricle, mild right      ventricular hypertrophy, borderline left atrial enlargement, and a small      pericardial effusion.   HISTORY OF PRESENT ILLNESS:  The patient is a 65 year old African-American  male with past medical history significant for hypertension, diabetes,  chronic renal insufficiency, and decreased left ventricular ejection  fraction  by an echo in September of 2004 showing EF of 40 to 50%, who  presents to clinic to establish care with a primary care physician.  Upon  questioning, he endorses chest pain which is worse with exertion, associated  with shortness of breath and diaphoresis which last 30 to 60 minutes and is  only relieved by rest.  He describes the pain as midsternal without  radiation.  An indigestion-type pain associated with generalized fatigue.  He says he has had this pain for the past several months, but feels like his  symptoms are getting worse and more frequent.  Also says the pain seems to  be related to taking acid pills at night, but is not related to meals.   PHYSICAL EXAMINATION:  VITAL SIGNS:  Blood pressure 210/105, pulse 88,  temperature 99.3.  GENERAL:  The patient is in no acute distress, sitting in his chair,  mildly  obese.  HEENT:  Eyes; pupils equal, round, and reactive to light, extraocular  muscles intact, no scleral icterus.  NECK:  Prominent carotid upstrokes with radiation of a cardiac murmur to the  carotids, no JVD.  LUNGS:  Clear to auscultation bilaterally.  HEART:  Regular rate with occasional irregularity, 3/6 systolic ejection  murmur heard throughout the precordium.  Normal S1 and S2.  ABDOMEN:  Soft, nontender, and nondistended.  Positive bowel sounds.  EXTREMITIES:  No edema.  NEUROLOGY:  Alert and oriented x3 with no focal deficits noted.  PSYCHIATRIC:  Flat affect and endorses feeling bad because he has not been  able to work for a year or take care of himself.   LABORATORY DATA:  There were no labs on admission.   HOSPITAL COURSE:  Problem 1.  Hypertension.  The patient was admitted to a  telemetry bed and placed on a nitroglycerin drip which was discontinued on  hospital day #1.  On further questioning, the patient states he has not been  taking his medications at home as they sometimes cause chest pain.  Over the  past week, he has taken his medications no  more than a couple of times.  Of  note, he had been prescribed Atenolol 100 mg daily as an outpatient, but had  not been taking this medication at home and was unaware that he was supposed  to be.  Blood pressures continued to run from 0000000 to A999333 systolic over 80 to  [id number] diastolic.  Medication changes included addition of metoprolol 25 mg  t.i.d., Lasix dose was decreased to 40 mg once daily from 40 mg b.i.d. as  this was not felt to be an appropriate medication to augment for improved  blood pressure control given the patient's diastolic dysfunction.  It is  felt at the time of discharge that the patient's hypertension is most likely  due to medication noncompliance.  On a previous admission, MRA of renal  arteries were performed to rule out renal artery stenosis and was negative.  In addition, his urine was tested for metanephrine excretion in order to  rule out pheochromocytoma and this was negative also.  The patient may  undergo a workup for hyperaldosteronemia given elevated blood pressures and  a low potassium which was discovered incidentally on admission.   Problem 2.  Chest pain.  Given the patient's many cardiac risk factors  including age, smoking history, diabetes, and hypertension, it was felt this  patient was at risk for acute coronary syndrome.  He was placed on  telemetry.  Cardiac enzymes were cycled x3 and troponins were found to be  elevated at 0.1, 0.12, and 0.07.  CK and CK-MB were within normal limits.  EKG on admission showed normal sinus rhythm with a rate of 68, some left  axis deviation, but no ST abnormality or Q waves.  Occasional PVC's.  Echocardiogram was performed with results as stated above.  Cardiology was  consulted and it was decided that cardiac catheterization should be  performed which as stated above was negative for any critical stenosis at  this time.  Problem 3.  Diabetes mellitus.  The patient is currently on Glipizide 5 mg  daily with good  control given last hemoglobin A1C in August of 2005 of 6.1.  This medication regimen will be continued as an outpatient.   Problem 4.  Chronic renal insufficiency.  The patient is currently at  baseline with a creatinine of 1.8 with microalbumin of  4.18.   Problem 5.  Mild depression.  The patient endorses depressed mood and  feelings of worthlessness given the fact that he has been out of a job for a  year and feels he is unable to take care of himself.  Celexa 20 mg daily was  started in the hospital and this will be followed as an outpatient.   Problem 6.  Hypokalemia.  The patient was noted to have a potassium of 2.9  on admission.  This was repleted with 40 mEq of potassium x3 and potassium  came up to 3.7 on April 26, 2004.  The patient was asymptomatic and  there were no worrisome EKG changes.   DISCHARGE LABORATORY DATA:  CBC; hemoglobin 12.3, hematocrit 36.2, white  blood cell count 5.7, platelets 214.  Chem-7; sodium 141, potassium 3.7,  chloride 107, bicarb 27, BUN 21, creatinine 1.8, glucose 107.  PT 12.4, INR  0.9, PTT 28.  AST 22, ALT 22, alkaline phosphatase 87, total bilirubin 0.7,  albumin 4.1, calcium 8.9, magnesium 2.1.  Lipid profile showed cholesterol  of 196, triglycerides 210, HDL 38, LDL 116.  BNP was 258.3, TSH was 1.335.       Hillard Danker  D:  04/27/2004  T:  04/27/2004  Job:  [id number]

## 2010-12-15 NOTE — Cardiovascular Report (Signed)
Patrick [name], Patrick [name]                ACCOUNT NO.:  [id number]   MEDICAL RECORD NO.:  [id number]          PATIENT TYPE:  INP   LOCATION:  73                         FACILITY:  [facility]   PHYSICIAN:  Loretha Brasil. Lia Foyer, M.D. [facility] OF BIRTH:  Oct 22, 1945   DATE OF PROCEDURE:  04/26/2004  DATE OF DISCHARGE:                              CARDIAC CATHETERIZATION   INDICATIONS:  [name] is a 65 year old gentleman who has presented with  some chest pain with mild increase in troponin.  The patient had no definite  electrocardiogram abnormalities.  He does have a leftward-oriented axis.  Current study was done to assess coronary anatomy.   The patient also had elevated creatinine and he was treated with the sodium  bicarb protocol prior to catheterization.   PROCEDURE:  1.  Left heart catheterization.  2.  Selective coronary arteriography.   DESCRIPTION OF PROCEDURE:  The patient was brought to the cath lab and  prepped and draped in usual fashion.  Through an anterior puncture, the  right femoral artery was entered.  Blood pressures were recorded.  Doses of  intravenous labetalol were administered to try to bring down the blood  pressure and this improved the pressure over time.  Coronary arteriograms  with limited contrast were performed in the right coronary artery and the  left coronary artery.  Central aortic and left ventricular pressures were  measured with a pigtail.  Ventriculography was not performed because of  desire to reduce the contrast load.  He tolerated the procedure well.  He  was taken to the holding area in satisfactory clinical condition.   HEMODYNAMIC DATA:  1.  Central aortic pressure initially 197/99, mean 132.  2.  Left ventricle 160/29.  3.  No gradient on pullback across the aortic valve.   ANGIOGRAPHIC DATA:  1.  Ventriculography was not done due to desire to reduce contrast load.  2.  The left main coronary artery was large and free of critical disease.  3.  The LAD was a large-caliber vessel.  There was about 30% proximal      narrowing followed by no significant focal narrowing.  There is a fair      amount of luminal irregularity distally.  However, none of this was high-      grade.  4.  There was a second diagonal branch that had perhaps 40% proximal      narrowing, although difficult to characterize due to the small size.  5.  The circumflex is a fairly large-caliber system with several marginal      branches.  There was a large posterolateral branch; all of this was      without significant focal narrowing.  6.  The right coronary artery had a takeoff proximally that was about 30% to      40%.  There was then a bend point in the vessel with mild luminal      irregularity.  The vessel opens back up and other than mild luminal      irregularity, no critical stenoses were noted.   CONCLUSIONS:  1.  Elevated  left ventricular end-diastolic pressure.  2.  Significant hypertension.  3.  Scattered coronary irregularities as noted in the above text.   RECOMMENDATIONS:  Aggressive blood pressure control with stressing  __________ compliance.  Close followup.       TDS/MEDQ  D:  04/26/2004  T:  04/27/2004  Job:  [id number]   cc:   Dola Argyle, M.D.   Evette Doffing, M.D.  [address] [place]  [place] [address]  Fax: [phone]   [facility]

## 2010-12-15 NOTE — Discharge Summary (Signed)
NAMEDOC, TEA                ACCOUNT NO.:  [id number]  MEDICAL RECORD NO.:  [id number]           PATIENT TYPE:  I  LOCATION:  [id number]                         FACILITY:  [facility]  PHYSICIAN:  Desiree Hane, MD     DATE OF BIRTH:  August 11, 1945  DATE OF ADMISSION:  11/22/2010 DATE OF DISCHARGE:  11/30/2010                              DISCHARGE SUMMARY   DISCHARGE DIAGNOSES: 1. Left Thalamic ischemic stroke. 2. Chronic Atrial fibrillation on Coumadin since 2008, who presented     this time with atrial fibrillation with slow ventricular response. 3. Bradycardia with pause of 2.38 seconds on telemetry     during this admission, just one episode. 4. Hypertension with a history of medication noncompliance. 5. Type 2 diabetes mellitus with A1C of 11.8. 6. Hyperlipidemia. 7. Chronic kidney disease stage 3 with baseline creatinine of 2-2.2. 8. Depression. 9. Gout. 10.Mildly enlarged prostrate on renal ultrasound in January 2007. 11.Sleep apnea. 12.Nonobstructive coronary artery disease status post cath in     September 2005.  DISCHARGE MEDICATIONS WITH ACCURATE DOSES: 1. Clonidine 0.2 mg take 1 tablet by mouth twice daily. 2. Aspirin 81 mg take 1 tablet by mouth daily. 3. Benazepril 40 mg take 2 tablets by mouth daily. 4. Diltiazem 90 mg take 2 tablets by mouth twice daily. 5. Famotidine 20 mg take 2 tablets by mouth daily. 6. Glipizide 10 mg take 1.5 tablets by mouth twice daily. 7. Insulin NPH inject 25 units subcutaneously daily at bedtime. 8. Omega-3 acid  ethyl esters take 1 capsule by mouth twice daily. 9. Pravastatin 40 mg take 2 tablets by mouth daily. 10.Warfarin 5 mg take 1.5 tablets by mouth daily for 4 days and follow     up with Dr. Johney Maine.  DISPOSITION AND FOLLOWUP:  The patient was discharged home in stable and improved condition from [facility] on Nov 30, 2010.  The patient has been scheduled a follow-up appointment with Dr. Obie Dredge at [facility] on Dec 11, 2010, at 3:15 p.m.  At that time, his blood pressures and blood sugars needs to be followed up.  Appropriate adjustment should be made on his antihypertensive medications and his diabetic regimen based on the findings.  He also needs to get a BMET as  a follow up on his Creatinine. The patient will also follow up with Dr. Johney Maine for his PT/INR check on Monday.  He was discharged on 7.5 mg of warfarin that needs to be continued for 4 days up until the time of discharge.  PROCEDURES PERFORMED: 1. Chest x-ray on November 22, 2010, which showed low lung volumes with     mild bibasilar airspace disease, left greater than right. 2. MRA head which showed moderate to severe anterior circulation     atherosclerosis.  Anterior circulation atherosclerosis maximal in     the distal carotid and internal carotid artery and left MCA M1     segment. 3. MRI of brain which showed acute left anterior thalamic lacunar     infarct.  No mass effect or hemorrhage.  Scattered additional     chronic small  and medium sized vessel ischemia. 4. Carotid angiogram on Nov 29, 2010, which showed approximately 50%     stenosis of the distal basilar artery probably to the origin of the     superior cerebellar artery.  Around 60% stenosis of the proximal     left posterior cerebellar artery.  Approximately 50% stenosis of     middle cerebral artery in the proximal M1 segment.  CONSULTATIONS:  Cardiology for his atrial fibrillation with slow ventricular response.  ADMITTING HISTORY AND PHYSICAL:  65 year old [place] gentleman with prior history of CVA in 2007,  atrial fibrillation and nonobstructive CAD who was transferred from [facility] for altered mental status.  The history was provided by wife.  She reports that for last 2-3 weeks the patient has been slower than usual to respond verbally.  She noted that Monday he was sleeping more than usual.  On Tuesday, she noticed he was walking  slowly.  But when the patient was asked about his walking he responded by telling his wife that he just felt weak and did not feel good.  The patient was incoherent and has slurred speech when it also noticed with some confusion.  The patient in [facility] was given doses of atropine as his heart rate dropped to 30, which brought it up to 55.  The patient denied any chest pain, shortness of breath, diaphoresis, palpitations, abdominal pain, nausea, vomiting or diarrhea.  PHYSICAL EXAMINATION:  VITAL SIGNS:  Temperature 98.3, respiratory rate 15, pulse 39-45, blood pressure 161-191 over 75-96, O2 sats 100% on 2 liters. GENERAL:  Middle-aged gentleman, dyspneic, in no acute distress. HEENT:  Pupils equal, round, reactive to light, extraocular movements intact, no JVD. LUNGS:  Clear to auscultation anteriorly.  No wheezes, rhonchi or crackles. HEART:  Bradycardia, regular rhythm, no murmurs, rubs or gallops. ABDOMEN:  Soft, nontender, nondistended, positive bowel sounds. EXTREMITIES:  No edema or cyanosis. NEURO:  Alert, oriented x1, questionable right-sided droop, 5/5 strength throughout, sensation intact to light touch, finger-to-nose and heel-to- shin intact.  ADMITTING LABORATORIES:  White cell count 6.3, hemoglobin 15.2, hematocrit 41.7, platelets 206.  Sodium 139, potassium 3.6, chloride 100, bicarb 26, glucose 432, BUN 28, creatinine 2.1.  PT 26.3, INR 2.4, PTT 49.  UA was clear.  UDS was negative.  HOSPITAL COURSE BY PROBLEM: 1. Acute left anterior thalamic stroke evidenced by MRI head on November 23, 2010.  CT head was negative for any evidence of hemorrhage.     The likely cause for his stroke was  uncontrolled diabetes and      hypertension. The patient's MRA showed some occlusion in his posterior      and anterior circulation because of which he underwent carotid      arteriogram that showed approximately a 50% stenosis in the basilar      artery and about 50% stenosis  in the posterior cerebellar artery.       The patient does not need any intervention at this time.      He will continue his aspirin, statin and Coumadin.  The patient     was also set up with home health PT, OT.  The patient presented     with some altered mental status and  still had some cognitive     issues at the time of discharge.  His wife was aware of it and       agreed on taking care of his medications and  a close followup      with the [facility]. 2. Hypertension.  The patient's blood pressure remained elevated     during his hospital stay.  The patient was continued on his home     medication of benazepril.  The patient was started on Lasix but it     did not help him much and his Creatinine started trending up. His      lasix was held and he was started back on his home medications     Clonidine which did help to control his blood pressure to some     extent.  The patient will follow up with Dr. Obie Dredge at [facility]     outpatient where his blood pressure will be monitored and     appropriate changes in his antihypertensive regimen will be made     accordingly. 3. Hyperlipidemia.  The patient will continue his Pravachol for his     hyperlipidemia. 4. Type 2 diabetes mellitus.  The patient has a history of medication     noncompliance .  When he presented, his blood sugars     were 400.  The patient was started on 70/30 insulin in the hospital     which seemed to control his blood sugars pretty well.  But because     of the financial issues, his wife requested for him to be     discharged on the NPH insulin which he was taking previously and as     per the wife it did help in controlling the blood sugars.  The     patient and wife were advised to check the blood sugars prior to     the clinic appointment so that appropriate changes can be made in     the NPH insulin with that clinic appointment.  The patient also     needs to see Barnabas Harries, diabetes  counselor and educator for the     management of his type 2 diabetes. 5. Chronic atrial fibrillation. He initially presented with bradycardia      and was having atrial fibrillation with slow ventricular response which was felt     secondary to the patient's new cerebrovascular accident as well as     diltiazem.  Cardiology recommended holding diltiazem and decided to      restart on a lower dose when he could tolerate. On the day of his      procedure- carotid arteriogram( day 3 of hospitalization) he went      into A. fib with RVR and  was restarted back on diltiazem.     Since then, the patient continued to be in AFib with RVR and his      diltiazem was continued.  The patient was also noted to have increase      in his heart rate with physical therapy like and was discharged      on his home dose of diltiazem( 180 BID)after consulting the cardiologist. 6. CKD stage III.  The patient's baseline creatinine was 2-2.2. His Cr at     discharge wentup to 2.31. His lasix was held. Will get a BMET with his     next clinic visit.  DISCHARGE LABS AND VITALS:  His discharge vitals include temperature 98.5, pulse 81, respiratory rate 20, blood pressure 176/90, O2 sats 98% on room air.  His discharge labs include white cell count 10.2, hemoglobin 13.2, hematocrit 40.5, platelets of 220.  PT 15.3, INR  1.19.______________________________ Pedro Earls, MD   ______________________________ Desiree Hane, MD    MS/MEDQ  D:  11/30/2010  T:  11/30/2010  Job:  [id number]  cc:   Trish Fountain, MD  Electronically Signed by Pedro Earls MD on 12/13/2010 06:16:50 PM Electronically Signed by Desiree Hane  on 12/15/2010 12:19:57 PM

## 2010-12-15 NOTE — Consult Note (Signed)
NAME:  ZYMIER, Patrick [name] NO.:  [id number]   MEDICAL RECORD NO.:  [id number]          PATIENT TYPE:  INP   LOCATION:  3023                         FACILITY:  [facility]   PHYSICIAN:  Louis Meckel, M.D.DATE OF BIRTH:  September 16, 1945   DATE OF CONSULTATION:  08/21/2005  DATE OF DISCHARGE:                                   CONSULTATION   REFERRING PHYSICIAN:  Medicine Teaching Service.   REASON FOR CONSULTATION:  Chronic kidney disease and hypertension causing  cerebellar bleed.   HISTORY OF PRESENT ILLNESS:  Mr. Berland is a 65 year old black male with  past medical history significant for diabetes mellitus, hypertension, not  compliant with medications.  He presented to the emergency department on  August 09, 2005, with a headache and was found to have a blood pressure of  210/110, atrial fibrillation with rapid ventricular response and evidence of  a cerebellar bleed by head CT.  Patient fortunately never required any  neurosurgery and has been monitored closely.  His blood pressure is now in  the 140/90 range.  He does have some baseline chronic kidney disease with a  creatinine of between 1.7 and 1.9 noted in 2005.  Here in the hospital, his  creatinine has been as high as 2.3 but has now leveled out at around 2.0.  We are asked to assist with blood pressure management and chronic kidney  disease. Right now, patient is asymptomatic. He is awaiting discharge to  home hopefully soon.  He does report some urinary hesitancy but not  frequently.  Denies NSAIDs, history of kidney stones or urinary tract  infection.   PAST MEDICAL HISTORY:  1.  Type 2 diabetes mellitus for just a couple of years.  It is well      controlled here on oral agents.  2.  Hypertension x10 years.  He freely admits to not taking his medications.  3.  Chronic kidney disease.  Back in 2005, I see a creatinine level of about      1.8.  In 2004, it looks like he had a renal ultrasound which is  fairly      normal and an MRA which was negative for renal artery stenosis at that      time.  4.  Paroxysmal atrial fibrillation.  5.  Cardiac catheterization in September of 2005 which found noncritical      disease.  6.  Depression.  7.  History of CVA, remote by CT scan.   CURRENT MEDICATIONS:  1.  Norvasc 10 mg twice a day.  2.  Catapres 0.1 t.i.d.  3.  Pepcid 20 b.i.d.  4.  Lasix 20 daily.  5.  Glucotrol 10 daily.  6.  Hydralazine 10 t.i.d.  7.  Prinivil 20 daily.  8.  Lopressor 50 b.i.d.   ALLERGIES:  NO KNOWN DRUG ALLERGIES.   SOCIAL HISTORY:  Patient does have a history of previous tobacco.  Drinks on  the weekends.  Previously worked as a Animal nutritionist.   FAMILY HISTORY:  Father died at age 45 with coronary artery disease,  hypertension.  Mr.  Vigen also believes he may have had kidney trouble.  Sister has diabetes.   REVIEW OF SYSTEMS:  Negative for vision changes, cough, nausea, vomiting,  diarrhea, constipation, fevers, chills, chest pain, shortness of breath,  melena, hematochezia, hematuria, dysuria.  Otherwise, review of systems is  negative.   PHYSICAL EXAMINATION:  GENERAL APPEARANCE:  A black male who is alert and in  no acute distress.  He reports some balance issues since his cerebellar  bleed.  VITAL SIGNS:  Patient is afebrile.  Blood pressure 149/94, heart rate 70,  respirations 20, oxygen saturation 98% on room air.  Sugars are good at 107  and 116.  HEENT:  Pupils are equal, round and reactive to light.  Extraocular  movements intact.  Moist mucous membranes.  NECK:  No jugular venous distension, no bruits.  LUNGS:  Clear to auscultation bilaterally without wheezes or crackles.  CARDIOVASCULAR:  Regular rate and rhythm without murmurs, rubs, or gallops.  ABDOMEN:  Positive bowel sounds, soft, nontender, nondistended.  EXTREMITIES:  Trace to 1+ edema.  NEUROLOGIC:  Patient reports balance issues.   LABORATORY DATA:  Hemoglobin 13.9.  BUN 32  and creatinine 2.1.  Calcium 9.3.  A 24-hour urine protein is 588 mg.  PTH is 196.   ASSESSMENT:  A 65 year old black male with chronic kidney disease,  hypertension, status post cerebellar hemorrhage.   1.  Hypertension:  This is most likely essential hypertension given family      history.  He has had a brief work-up for secondary causes with an MRA      negative in 2004 and a bland urinalysis would argue against a primary      renal disease.  He also has subsequent chronic kidney disease likely      related to hypertension which has not been controlled as well as a      contributing factor of diabetes mellitus.   With regard to his hypertension, I would attempt to simplify his regimen as  much as possible to improve changes of adherence.  I have increased his once  a day Lasix and once a day Prinivil and I have discontinued his three time a  day hydralazine because I question the utility of that plus an ACE  inhibitor.  I will decrease his clonidine to twice a day.   1.  Chronic kidney disease:  His GFR is around 45 to 50 mL/minute.  I have      advised patient of what this means.  We will strive for adequate blood      pressure control as above.  I will recheck renal ultrasound to see if      there have been any changes between 2004 and now and will check SPEP and      UPEP for completeness sake.  2.  Secondary hyperparathyroidism:  Will add Hectorol to his regimen.   I would be happy to see him as an outpatient.  I will see him tomorrow and  attempt to arrange that at that time.   Thank you very much for this consultation.          ______________________________  Louis Meckel, M.D.    KAG/MEDQ  D:  08/21/2005  T:  08/22/2005  Job:  [id number]

## 2010-12-15 NOTE — Discharge Summary (Signed)
NAMESHAKIR, ISENHOUR NO.:  [id number]   MEDICAL RECORD NO.:  [id number]          PATIENT TYPE:  INP   LOCATION:  5019                         FACILITY:  [facility]   PHYSICIAN:  Acquanetta Chain, D.O.DATE OF BIRTH:  October 12, 1945   DATE OF ADMISSION:  06/05/2006  DATE OF DISCHARGE:  06/06/2006                                 DISCHARGE SUMMARY   DISCHARGE DIAGNOSES:  1. Acute gout, podagra of right toe.  2. Hypertension, orthostatic hypotension secondary to autonomic      dysfunction with stable pulse on orthostatic readings.  3. Type II diabetes mellitus.  4. History of cerebellar and lacunar infarct.  5. Chronic renal insufficiency with a baseline creatinine of 1.6.   DISCHARGE MEDICATIONS:  1. Prednisone taper 40 mg tabs x3 days, 30 mg tabs x3 days, 20 mg tabs x3      days, 10 mg tabs x3 days p.o.  2. Lasix 40 mg p.o. daily.  3. Toprol-XL 100 mg p.o. daily.  4. Clonidine 0.1 mg p.o. b.i.d.  5. Glipizide 10 mg p.o. daily.  6. Benazepril 40 mg daily.  7. Pravastatin 40 mg p.o. daily.  8. Lortab 10/500 q.6 hours p.r.n. for foot pain #60.   DISPOSITION AND FOLLOWUP:  Mr. Isaguirre was discharged from the hospital in  good condition to his private residence.  He has a followup appointment to  be scheduled in the outpatient clinic for follow up of his medical  management of his gout.  At the date of his followup visit, a B-met should  be checked and a CBC.  His blood sugars should also be checked since he is  on steroid medication.  At the time of his followup, he will have completed  his steroid taper.  Symptomatic improvement of his gout in his great right  toe should be verified.   BRIEF ADMITTING HISTORY AND PHYSICAL:  Mr. Southers is a very pleasant 65-year-  old African-American gentleman who presented to the [facility] outpatient  clinic on June 05, 2006 complaining of right foot pain for 2 days.  He  describes pain upon walking, tenderness to touch on his  foot with some  increased swelling and redness.  He denied any trauma to his foot or injury.  On presentation to the outpatient clinic, he was also found to be mildly  orthostatic.  However, he was asymptomatic.  He did not have a white count  nor was he febrile.  He was admitted to the hospital to administer fluids as  well as to work up his right foot pain.   HOSPITAL COURSE:  1. Right foot pain:  Mr. Zawadzki was started on empiric antibiotics of      Vancomycin and Zosyn, however it was determined that in the absence of      a fever and a white blood cell count, it was highly unlikely that this      represented a cellulitis.  He did have diffuse swelling in his right      foot but this was more likely associated with an acute gout flare.  He      has never had gout before but this may represent his first episode.  He      was given 1 dose of colchicine.  He was given IV fluids at 150 cc an      hour for 2-3 hours.  At that point, his IV fluids were DC 'd.  He was      hypertensive so his home blood pressure medications were restarted.  IV      antibiotics were DC 'd and a Prednisone taper was started for the      treatment of acute gout.  Colchicine was also stopped due to side      effects of diarrhea.  2. Orthostatic hypotension:  Orthostatic readings in the clinic were      consistent with orthostatic hypotension although Mr. Krogstad denies any      symptoms of pre syncope or dizziness associated with his orthostasis.      His orthostasis is likely a result of diabetic autonomic dysfunction      and this also is supported with his pulse rate readings.  He did      receive some IV fluids.  And was restarted on his blood pressure      medications which did include a home dose of Lasix.  He did have some      peripheral pitting edema.  He states that this is unchanged from his      baseline.  3. Type II diabetes:  Mr. Schiesser was managed on sliding scale insulin in      the acute  setting.  He was discharged on his home medication regimen      which included glipizide.  His baseline hemoglobin A1c is 6.5.   DISCHARGE LABORATORIES:  Sodium 141, potassium 3.7, chloride 106, CO2 29,  glucose 154, BUN 18, creatinine 1.6, calcium 8.9.  CBC:  White blood cell  count 5.6, hemoglobin 13.2, hematocrit 38.9, platelet count 199.   DISCHARGE VITAL SIGNS:  His blood pressure is 189/117 without his morning  medication, temperature 98.2, pulse 72, respirations 20.      Acquanetta Chain, D.O.  Electronically Signed     ELG/MEDQ  D:  06/06/2006  T:  06/07/2006  Job:  [id number]

## 2010-12-15 NOTE — Consult Note (Signed)
NAME:  Patrick [name], Patrick [name] NO.:  [id number]   MEDICAL RECORD NO.:  [id number]          PATIENT TYPE:  INP   LOCATION:  [id number]                         FACILITY:  [facility]   PHYSICIAN:  Sueanne Margarita, P.A. DATE OF BIRTH:  [birth year]   DATE OF CONSULTATION:  08/24/2005  DATE OF DISCHARGE:                                   CONSULTATION   PRIMARY CARE PHYSICIAN:  None.   ALLERGIES:  No known drug allergies.   HISTORY OF PRESENT ILLNESS:  Patrick [name] is a 65 year old male admitted on  August 09, 2005, with very severe headache. He was found on CT study to  have a right cerebellar hemorrhage with intrusion into the 4th ventricle.  This was probably secondary to poorly controlled hypertension and may happen  again. He was also diagnosed with new onset atrial fibrillation. At first  the patient had been receiving labetalol  and his atrial fibrillation rate  was fairly well controlled. When the labetalol was discontinued, on hospital  day #6, the patient's heart rate increased. The patient was started on  metoprolol 50 mg b.i.d. on January 18th, increased to 75 mg b.i.d. on  January 20th. The patient's atrial fibrillation was noted to slow on this  dose on beta blocker with pauses. He did not have termination pauses and he  had no pause greater than 3 seconds.   Beta blocker has been, stepwise, decreased to a current level of 25 mg  b.i.d. His current atrial fibrillation is controlled. It is a coarse atrial  fibrillation and he has mild bradycardia.   Of note the patient has had echocardiographic studies in September 2006 for  fatigue and dyspnea on exertion, ejection fraction 65% to75% with left  ventricular thickness markedly increased. He also had left heart  catheterization in September 2005 in a setting of chest pain and mild  increase in troponin-I studies. That catheterization showed that the LAD had  a 30% proximal stenosis with luminal irregularities to the rest of  the  vessel. The second diagonal had a 40% ostial stenosis.  The left circumflex  had no significant disease. The right coronary artery had a 30% to 40%  stenosis at a bifurcation of the first acute marginal. The patient is  currently in no acute distress. He has mild ataxia with gait. Admission  chest x-ray showed cardiomegaly with lungs clear. In addition, the patient  has chronic renal insufficiency. His admission creatinine was 2.0 which  quickly rose to a zenith value of 2.8 on January 12th. It has since  decreased to 1.8 at the time of this consult. He was also found to have  microalbuminuria on assay. Survey of electrocardiographic studies show that  in his previous hospitalizations the patient was in sinus rhythm, however  all of the electrocardiograms from this admission showed atrial fibrillation  with various rates.   Laboratory studies today revealed serum electrolytes with sodium of 139,  potassium 3.5, chloride 105, bicarbonate 27, BUN 23, creatinine 1.8,  glucose 102, alkaline phosphatase this admission 103, SGOT 23, SGPT 21.  Complete blood count showed white count  4.4, hemoglobin 13.2, hematocrit  38.2, platelet count 251,000. His hemoglobin A1c was 7.3. His TSH this  admission was 3.078.   ASSESSMENT:  1.  Admit with right cerebellar hemorrhage and newly diagnosed atrial      fibrillation.  2.  Atrial fibrillation rate, now controlled with Lopressor 25 mg b.i.d.  3.  The patient is not a Coumadin candidate secondary to cerebral bleed.  4.  Hypertension, poorly controlled, with a question of noncompliance.  5.  Hypertensive kidney disease with baseline creatinine of 2.0 with      microalbuminuria.  6.  Diabetes.   PLAN:  Recommend blood pressure control as the internal medicine service is  doing. The patient may be discharged from a electrocardiographic standpoint  when blood pressure control is felt to be indeed satisfactory. The patient  may not need diuretic  therapy, but if required for further antihypertensive  control the patient will need a low dose of potassium.   ADDENDUM:  The patient will not need a pacemaker.      Sueanne Margarita, P.A.     GM/MEDQ  D:  08/24/2005  T:  08/24/2005  Job:  [id number]

## 2010-12-15 NOTE — Consult Note (Signed)
NAME:  GENERAL, FILOSA NO.:  [id number]   MEDICAL RECORD NO.:  [id number]          PATIENT TYPE:  INP   LOCATION:  2                         FACILITY:  [facility]   PHYSICIAN:  Ethelle Lyon, M.D. LHCDATE OF BIRTH:  [birth year]   DATE OF CONSULTATION:  DATE OF DISCHARGE:                                   CONSULTATION   REQUESTING PHYSICIAN:  Evette Doffing, M.D., and [facility].   REASON FOR CONSULTATION:  Chest discomfort and uncontrolled hypertension.   HISTORY OF PRESENT ILLNESS:  Mr. Stockdale is a 65 year old gentleman with  longstanding hypertension with resultant severe LVH all in the setting of  medical noncompliance.  He was hospitalized in August of 2004 with heart  failure.  He had an adenosine Cardiolite at that time that showed no  evidence of ischemia with an EF of 47%.  Echocardiogram then demonstrated an  EF of 40-50% with severe LVH.  He had an episode of paroxysmal atrial  fibrillation during that hospitalization as well.  He was discharged home  with medical therapy.  Over that time, he has had severe fatigue and dyspnea  with activities, such as walking up a single flight of stairs.  He also  describes chest discomfort that occurs with activities such as washing the  dishes and it occurs fairly reliably approximately 30 minutes after eating.  Because he takes his medicines at the time of his meals, he attributed this  discomfort to his medicines and stopped taking a number of them.  He is not  specific about which he stopped.  The chest discomfort was a substernal  chest discomfort without radiation.  It would last somewhat less than an  hour, but frequently less long.  There was no radiation of the symptoms.  No  nausea, vomiting or diaphoresis.   The patient was admitted yesterday with the same complaints and we were  requested to consult.   PAST MEDICAL HISTORY:  1.  Hypertension with severe LVH.  2.  Chronic renal  insufficiency with a creatinine at baseline of 1.7-1.9.      Prior ultrasound was reportedly negative for renal artery stenosis.  3.  Diabetes mellitus diagnosed in August of 2004 with a hemoglobin A1C of      6.1 in August of 2005, down from 12.1 a year ago.  4.  Severe LVH and paroxysmal atrial fibrillation.   ALLERGIES:  NKDA.   CURRENT MEDICATIONS:  1.  Cardizem CD 180 mg per day.  2.  Lopressor 12.5 mg q.8h.  3.  Protonix 40 mg per day.  4.  Aspirin 325 mg per day.  5.  Glucotrol 5 mg per day.  6.  K-Dur.  7.  Celexa 20 mg per day.  8.  Lotensin 40 mg per day.  9.  Lasix 40 mg twice per day.  10. Cardura 1 mg p.o. t.i.d.   SOCIAL HISTORY:  The patient is married.  Unemployed former Engineer, manufacturing systems.  His wife is a Oceanographer.  He previously smoked, but has been free  of tobacco for  approximately 30 years.  He gets minimal exercise.  He tries  to follow a low-salt diet.  Denies alcohol use.   FAMILY HISTORY:  His mother died at 23 with diabetes mellitus.  His father  died at 5 with heart problem.  The patient has 10 siblings.  Two brothers  died at childbirth and one died in his 78s of cancer.  Four sisters have  diabetes.  Others are alive and well.   REVIEW OF SYSTEMS:  Upper dentures.  Wears glasses.  Chronic tinnitus.  Frequent nocturia.  Chronic constipation.  His wife describes sleep apnea  symptoms.  The review of systems is otherwise negative in detail, except as  above.   PHYSICAL EXAMINATION:  GENERAL APPEARANCE:  He is a generally well-  appearing, though overweight, man in no distress.  Affect is markedly flat.  VITAL SIGNS:  Pulse 74, blood pressure 140/82, oxygen saturation 95% on room  air.  He is afebrile.  WEIGHT:  231 pounds.  HEENT:  Unremarkable.  NECK:  He has jugular venous distention to approximately 7 cm.  LUNGS:  Clear to auscultation.  HEART:  He has a nondisplaced point of maximal cardiac impulse.  There is a  regular rate and rhythm  without murmur or rub.  There is a soft S4.  No S3.  ABDOMEN:  Soft, nondistended and nontender.  There is no hepatosplenomegaly.  Bowel sounds are normal.  EXTREMITIES:  Warm without clubbing, cyanosis, edema or ulceration.  PULSES:  Carotid pulses are 2+ bilaterally without bruit.  Femoral pulses  are 2= bilaterally without bruit.  DP pulses 2+ bilaterally.   LABORATORY DATA:  Electrocardiogram demonstrates normal sinus rhythm and LVH  with repolarization abnormality.   Laboratory studies remarkable for a hematocrit of 41, creatinine 1.9,  potassium 2.6, troponin I 0.12 and INR 0.9.   IMPRESSION/RECOMMENDATION:  1.  Hypertension.  Severe hypertension appears to be the primary problem      leading to his LVH.  Medical noncompliance is clearly an issue, however,      low potassium raises question of hyperaldosteronism.  Suggest checking 8      a.m. standing serum renin activity and aldosterone levels when the      patient is stable as an outpatient.  This would require cessation of      Lasix.  Would not check this during this hospitalization.  2.  Chest discomfort.  The patient clearly has had exertional chest      discomfort with minimal activity.  Certainly his LVH alone could be      responsible for this.  However, given his probably positive family      history and diabetes mellitus, hypertension and former tobacco use,      coronary disease needs to be considered.  He had a negative Cardiolite      one year ago.  I have recommended a cardiac catheterization for      definitive diagnosis.  The risks and potential benefits were explained      to the patient.  He agrees to proceed.      WED/MEDQ  D:  04/25/2004  T:  04/25/2004  Job:  [id number]

## 2010-12-15 NOTE — H&P (Signed)
NAMENOUR, THEEL NO.:  [id number]   MEDICAL RECORD NO.:  [id number]          PATIENT TYPE:  INP   LOCATION:  3112                         FACILITY:  [facility]   PHYSICIAN:  Ophelia Charter, M.D.DATE OF BIRTH:  1946/06/05   DATE OF ADMISSION:  08/09/2005  DATE OF DISCHARGE:                                HISTORY & PHYSICAL   CHIEF COMPLAINT:  Headache.   HISTORY OF PRESENT ILLNESS:  The patient is a 65 year old black male who was  in his usual state of health until this morning, when he woke up with a  headache, which he describes as a severe headache.  His wife called EMS who  brought him to the [facility] Emergency Department, where  he was evaluated by Dr. Milton Ferguson.  The evaluation included a cranial CT  scan which demonstrated that the patient had a right cerebellar hemorrhage  and a neurosurgical consultation was requested.   The patient was also diagnosed with atrial fibrillation with a rapid  ventricular response.  The patient was treated with medications and a  medicine consultation was also requested.  Presently the patient complains  of a headache.  He denies chest pain or shortness of breath, numbness,  tingling, weakness, abdominal pain, etc.   PAST MEDICAL HISTORY:  1.  Positive for hypertension.  The patient admits that he is not taking his      hypertension medications because they made him feel tired and weak.  2.  Diabetes mellitus.  3.  The patient had some sort of cardiac issues about two years ago, and had      a stress test.  He does not remember who his cardiologist was, but said      that the stress test turned out okay.  He does not have a primary      doctor.   PAST SURGICAL HISTORY:  None.   PRESENT MEDICATIONS:  None.   ALLERGIES:  No known drug allergies.   FAMILY HISTORY:  The patient's mother died at age 78.  The patient's father  died at age 33, with a heart attack.   SOCIAL HISTORY:  The  patient is married.  He has four children.  He lives in  [place].  He is employed as a Presenter, broadcasting.  He denies tobacco, ethanol  or drug use.   REVIEW OF SYSTEMS:  Negative except as above.   PHYSICAL EXAMINATION:  GENERAL:  A pleasant 65 year old black male, who  complains of headache.  VITAL SIGNS:  Blood pressure 159/93.  It had been as high as 210/110.  Heart  rate is as high as 120, presently in the 70's.  HEENT:  Normocephalic and atraumatic.  Pupils are miotic and equal, round,  reactive to light.  Extraocular muscles intact.  The patient has bilateral  nystagmus.  Oropharynx is benign.  He is wearing dentures.  NECK:  Supple without masses, deformities, jugular venous distention.  THORAX:  Symmetric.  HEART:  Irregularly irregular rhythm.  LUNGS:  Clear to auscultation.  ABDOMEN:  Soft, nontender.  EXTREMITIES:  Without  deformities.  NEUROLOGIC:  The patient is alert and oriented x3.  Glasgow Coma Scale is  15.  Cranial nerves II-XII  are examined bilaterally and are grossly normal.  Vision and hearing are grossly normal bilaterally.  Motor strength is 5/5 in  the deltoid, biceps, triceps, hand grip, quadriceps, gastrocnemius, extensor  longus.  Sensory exam is intact to rapid alternating movements of the upper  extremity bilaterally, but he does have dysmetria in his right upper  extremity to finger-to-nose.  Deep tendon reflexes 1-2/4 in the bilateral  biceps, triceps, quadriceps, gastrocnemius.  There is no ankle clonus.   ADMISSION STUDIES:  I have reviewed the patient's cranial CT scan performed  without contrast at [facility] on August 09, 2005.  It  demonstrates that the patient has approximately a 3.0 cm right cerebellar  hemorrhage with mild surrounding edema and mild mass effect on the fourth  ventricle.  There is no hydrocephalus.   ASSESSMENT/PLAN:  1.  Right cerebellar hemorrhage:  I have discussed the situation with the      patient,  his wife and son.  I told him that he has a hemorrhage in the      cerebellum and right now he doesn't  need surgery, but if this      hemorrhage were to enlarge or develop significant swelling causing      compression of his brainstem or hydrocephalus, he would need surgery.      Will admit him to the intensive care unit for close observation.  Repeat      his CAT scan tomorrow.  Will attempt to keep his blood pressure under      control.  2.  Multiple medical issues:  I discussed with the patient and his family,      and will have the medicine doctor take a look at him, but I encouraged      him to have medical followup for his diabetes mellitus, hypertension and      now cerebrovascular accident.  They understand.      Ophelia Charter, M.D.  Electronically Signed     JDJ/MEDQ  D:  08/09/2005  T:  08/10/2005  Job:  [id number]

## 2010-12-15 NOTE — Discharge Summary (Signed)
NAMEDIX, HOVEL NO.:  [id number]   MEDICAL RECORD NO.:  [id number]                   PATIENT TYPE:  INP   LOCATION:  5733                                 FACILITY:  [facility]   PHYSICIAN:  Evette Doffing, M.D.               DATE OF BIRTH:  11/10/1945   DATE OF ADMISSION:  03/30/2003  DATE OF DISCHARGE:  04/08/2003                                 DISCHARGE SUMMARY   CONSULTATION:  Consultation was sought with cardiologist, Dr. Ron Parker.   MAJOR PROCEDURE PERFORMED:  1. Exercise stress Cardiolite test.  2. Magnetic resonant angiography of the renal arteries.   DISCHARGE DIAGNOSES:  1. Hypertension.  2. Chronic renal insufficiency.  3. Paroxysmal atrial fibrillation.  4. Diabetes mellitus type 2.   DISCHARGE MEDICATIONS:  1. Lisinopril 40 mg p.o. q. daily.  2. Cardizem 360 mg p.o. q. daily.  3. Clonidine 0.3 mg p.o. q.12h.  4. Glipizide 10 mg p.o. q. daily.  5. Cardura 1 mg p.o. q. daily.  6. Lasix 80 mg p.o. q. daily.  7. K-Dur 40 mEq p.o. q. daily.  8. Aspirin 325 mg p.o. q. daily.   FOLLOWUP:  The patient is to follow up at the [facility]  on Monday, April 12, 2003, for a blood pressure check.  M.D. to be  called p.r.n.  He is to also to follow up at cardiology clinic of Dr. Ron Parker  with arrangements made prior.  Specific issues that may need to be addressed  during his followup are his blood pressure, his CBG, and his potassium  status.   HISTORY OF PRESENT ILLNESS:  Patrick [name] is a 65 year old pleasant man who  has been treated for hypertension in the outpatient clinic at [facility]  since February 03, 2003, on a multi-drug regimen and multiple dose titrations in  order to achieve blood pressure control.  The patient came into the  outpatient clinic on the day of presentation with complaints of dizziness  that had began the evening prior to presentation.  This was accompanied by a  sensation of imbalance and generalized  weakness.  On the morning prior to  presentation, the patient also had nausea with decreased appetite and  vomiting.  He denied any chest pain or any diaphoresis.  He had mild  shortness of breath.  The patient did not have any constitutional complaints  such as fever, chills, or any contacts with sick people.  He denied any  headache on presentation, and did not admit to any vision changes.  He did  say that he was noncompliant with the medications on the day prior to  presentation, and his shortness of breath was basically restricted to a few  steps.  He could not walk a few stairs without running out of breath.  He  has been having left ear tinnitus since the day prior to presentation.  His  dizziness was worse on standing.  The patient is a former smoker.  He quit  27 years ago.  He is married, works at a [employer], currently employed.  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  He has recently moved from [place].  He is self-pay, and he lives with his son.   REVIEW OF SYSTEMS:  He does note weight loss, he lost six pounds in the last  few weeks.  He also notes easy fatigability, dyspnea, nausea and vomiting.  No history of hematochezia or melena.  He does note some cold intolerance,  and notes to be excessively thirsty at most times.  Does note of lower back  pain over the past two to three weeks prior to presentation.  Notes of  intermittent headache also prior to presentation, but none at presentation.  Notes to have social stressors, cannot find employment due to health  condition.   PHYSICAL EXAMINATION:  VITAL SIGNS:  He had a pulse of 55, blood pressure of  185/110, temperature of 97.5, respiratory rate of 20 per minute, oxygen  saturations of 98% on room air.  GENERAL:  There were no obvious abnormalities detected.  HEENT:  His pupils bilaterally were equal and reactive to light.  I was  unable  to visualize the fundi secondary to pupillary constriction.  ENT exam  was relatively normal.  NECK:  Supple neck and no goiter palpable.  RESPIRATORY:  Clear breath sounds auscultated bilaterally.  CARDIAC:  He had an irregularly irregular pulse, but no murmurs and normal  S1 and S2 heart sounds present on auscultation.  GASTROINTESTINAL:  His abdomen is soft, nontender, moves with respiration  and bowel sounds are present as normal.  EXTREMITIES:  Pulses are present, no pedal edema is present.  There are no  bruises present on skin.  MUSCULOSKELETAL:  He has grade 5/5 power in all four limbs.  Deep tendon  reflexes also normal in all four limbs.  PSYCHIATRIC:  He is easily distracted and has flat affect on psychiatric  evaluation.   LABORATORY DATA:  He had a BMP with sodium of 142, potassium of 3.8,  chloride of 103, bicarbonate of 32, BUN of 25, and creatinine of 2.1.  He  had a glucose concentration of 194.  His hemoglobin was at 13.9, hematocrit  of 41.7, white cell count of 4.3, and platelets of 302, with a MCV of 79.6.  On liver panel, he had a bilirubin of 0.7, alkaline phosphatase of 91, SGOT  of 28, SGPT of 39, protein of 7.8, albumin of 4, calcium of 9.8, serum  amylase level of 179, and lipase of 48.  His EKG showed sinus rhythm,  although the patient was in atrial fibrillation and left axis deviation.  His cardiac panel on admission was as follows:  CK of 157, CK-MB of 3.4, and  troponin levels of 0.03.   HOSPITAL COURSE:  #1 -  HYPERTENSION:  He was initially put on a  nitroglycerin drip in order to control his severe acute hypertension at that  time.  The drip was gradually titrated down, and the patient replaced on his  oral medications.  Control was satisfactorily achieved by increasing his  medications to his current doses, specifically increased was clonidine and the addition of Cardura.  His Lasix was tapered down from his initial  presentation dose to 8 mg p.o.  q. daily.  The patient was also increased on  his lisinopril dose prior to his discharge.  Several investigations were  done, but specifically to mention are his urine was tested for 24 hours  metanephrine excretion in order to rule out pheochromocytoma.  The urine was  collected and it was negative for metanephrine ASSAY, so the patient did not  have pheochromocytoma.  Magnetic resonance angiography was also done to  detect any renal artery stenosis and this was also absent upon  investigation, so that was successfully ruled out.  #2 -  TYPE 2 DIABETES MELLITUS:  The patient was started on Glipizide 5 mg  once a day, and his CBG's were monitored.  HB/A1C was determined and was  found to be at a level of 12.1% which was deemed to be quite high.  The  patient was eventually put on 10 mg once a day of Glipizide, and this is  what he was discharged upon.  #3 -  CHRONIC RENAL INSUFFICIENCY:  The patient was thought to have this  chronic renal insufficiency due to both insult from his long-standing and  uncontrolled hypertension and his new onset of type 2 diabetes mellitus.  The serum creatinine was monitored throughout his hospital stay, was found  to be progressively decreasing as his blood pressure control improved, and  it is anticipated that he will probably continue to decrease with a level of  around 1.9 to 1.7.  This will be further checked on an outpatient basis.  Other tests done to support his chronic renal insufficiency and to rule out  any acute insults were as follows:  A 24 hour urine creatinine and glucose  were both measured.  The creatinine over the 24 hour level was 1680 which is  within normal, and urine glucose excreted per day is 1384, which is quite  high, but is consistent with his diabetes mellitus.  Dipstick urinalysis was  positive for glycosuria and proteinuria.  An ultrasound of the kidneys was  also undertaken to determine any features of chronic renal failure, but  both  kidneys were noted to be normal in size and having normal echogenic pattern.  No evidence of hydronephrosis was seen, essentially ruling out any urinary  tract obstruction.  #4 -  CARDIAC ARRHYTHMIA, SPECIALLY ATRIAL FIBRILLATION:  This was managed  as follows:  We started off by getting a chest x-ray to determine any  structural changes that may have occurred in the heart, and on chest x-ray  his heart is within the upper limits of normal.  Provoked by this, we went  ahead and get an echocardiogram which showed overall decreased left  ventricular systolic function, left ventricular ejection fraction was  estimated in the range of 40 to 50%.  There was left ventricular wall  thickness with severe concentric hypertrophy of the ventricular muscle.  There was end diastolic left ventricular mid cavity obliteration that was noted.  His aortic valve is normal.  His mitral valve is also normal.  There  was trivial mitral valve regurgitation.  His left atrium is within the upper  limits of normal, and his right ventricle is normal.  His tricuspid valve  showed regurgitation on a grade one to two on a scale of one to four.  There  is no pericardial effusion present.  On the basis of this and an abnormal  EKG that was consistent with severe left axis deviation, we consulted  cardiology.  In collaboration with cardiology, we went ahead  and ran cardiac  markers, assay, and his first cardiac marker was as follows:  CK of 145, CK-  MB of 3.9, and troponin-I of 0.04.  His battery of second cardiac markers  showed a CK of 130, troponin of 0.04.  His third cardiac markers were also  within normal with a CK of 98, and a troponin-I of 0.03.  The cardiologist  went ahead and did a Cardiolite test on him, and it showed the following:  Cardiolite shows left ventricular dysfunction, but no evidence of ischemia.  The patient has severe left ventricular hypertrophy.  During his stay for  the paroxysmal  atrial fibrillation, the patient was transiently started on  Coumadin which was stopped by Dr. Ron Parker in view of the fact that if the  patient continued to have severe and uncontrolled hypertension, he could  have severe hemorrhagic sequela, in particular stressing on a  cerebrovascular accident.  Special note, prior to discharge the patient was  counseled on strict compliance to his medications, particularly try to  comply fully with his clonidine dose, and the patient was also counseled on  the importance of dietary changes in his lifestyle.  The patient was also  given counseling on the need for exercise and its implications on  hypertension, and the patient was asked to keep all his appointments at the  Outpatient Clinic in order to be of ideal health status.      Patrick Shiley, MD                             Evette Doffing, M.D.    JP/MEDQ  D:  04/08/2003  T:  04/10/2003  Job:  [id number]   cc:   Dola Argyle, M.D.

## 2010-12-17 NOTE — Assessment & Plan Note (Signed)
He will be due for his next FLP in September 2012.  His LDL has been very close to goal on the pravastatin.

## 2010-12-17 NOTE — Assessment & Plan Note (Signed)
Blood pressure today is within his goal of <130/80.  It is completely possible that now that his wife is watching his medications he is taking it more consistently and will get much better control now that he is regularly taking his medications.  We will need to continue to monitor his blood pressure and pulse.

## 2010-12-17 NOTE — Assessment & Plan Note (Signed)
He still has not picked up his CPAP machine.  The importance of compliance with it was stressed today because it could help control not only his blood pressure and ensure that he does not have hypoxic episodes at night but could also improve his diabetes control as well as his lipid control and help his fatigue issues that he has had lately.

## 2010-12-17 NOTE — Assessment & Plan Note (Signed)
He continues to have some slow speech and per his wife's report his memory is poor. She will continue to help with monitoring his medications.  They are meeting with the SLP this week to get suggestions for exercises to continue working with his speech and his memory.  We will continue to monitor his progress.  In the mean time we need to do the best we can to manage his co-morbidities including his diabetes, HTN, and HLP.

## 2010-12-17 NOTE — Assessment & Plan Note (Signed)
Lab Results  Component Value Date   HGBA1C  Value: 11.8 (NOTE)                                                                       According to the ADA Clinical Practice Recommendations for 2011, when HbA1c is used as a screening test:   >=6.5%   Diagnostic of Diabetes Mellitus           (if abnormal result  is confirmed)  5.7-6.4%   Increased risk of developing Diabetes Mellitus  References:Diagnosis and Classification of Diabetes Mellitus,Diabetes [id number] 1):S62-S69 and Standards of Medical Care in         Diabetes - 2011,Diabetes [id number]  (Suppl 1):S11-S61.* 11/22/2010   HGBA1C 11.0 10/27/2010   HGBA1C 10.6 06/30/2010   Lab Results  Component Value Date   MICROALBUR 37.60* 10/27/2010   LDLCALC  Value: 73        Total Cholesterol/HDL:CHD Risk Coronary Heart Disease Risk Table                     Men   Women  1/2 Average Risk   3.4   3.3  Average Risk       5.0   4.4  2 X Average Risk   9.6   7.1  3 X Average Risk  23.4   11.0        Use the calculated Patient Ratio above and the CHD Risk Table to determine the patient's CHD Risk.        ATP III CLASSIFICATION (LDL):  <100     mg/dL   Optimal  100-129  mg/dL   Near or Above                    Optimal  130-159  mg/dL   Borderline  160-189  mg/dL   High  >190     mg/dL   Very High 11/23/2010   CREATININE 2.04* 12/11/2010   On admission his A1C was 11.8% which is increased from even his last office visit with me.  He likely is not taking his medications at home previous to his hospitalization.  His wife is helping him monitor his medications so we will see how his control is over the next few months.  He will likely need the addition of meal insulin sometime in the future but for now we will continue to monitor and encourage diet and exercise.

## 2010-12-17 NOTE — Assessment & Plan Note (Signed)
Pulse today in the clinic is 57 which is still slow.  We will decrease his diltiazem by 30 mg daily and see if that continues to control his A. Fib without dropping his heart rate to low.

## 2010-12-18 ENCOUNTER — Ambulatory Visit: Payer: Medicare Other

## 2010-12-26 ENCOUNTER — Ambulatory Visit (INDEPENDENT_AMBULATORY_CARE_PROVIDER_SITE_OTHER): Payer: Medicare Other | Admitting: Internal Medicine

## 2010-12-26 ENCOUNTER — Encounter: Payer: Self-pay | Admitting: Internal Medicine

## 2010-12-26 ENCOUNTER — Other Ambulatory Visit: Payer: Self-pay | Admitting: Internal Medicine

## 2010-12-26 ENCOUNTER — Ambulatory Visit (INDEPENDENT_AMBULATORY_CARE_PROVIDER_SITE_OTHER): Payer: Medicare Other | Admitting: Pharmacist

## 2010-12-26 DIAGNOSIS — Z7901 Long term (current) use of anticoagulants: Secondary | ICD-10-CM

## 2010-12-26 DIAGNOSIS — I4891 Unspecified atrial fibrillation: Secondary | ICD-10-CM

## 2010-12-26 DIAGNOSIS — G4730 Sleep apnea, unspecified: Secondary | ICD-10-CM

## 2010-12-26 DIAGNOSIS — I10 Essential (primary) hypertension: Secondary | ICD-10-CM

## 2010-12-26 DIAGNOSIS — E119 Type 2 diabetes mellitus without complications: Secondary | ICD-10-CM

## 2010-12-26 LAB — GLUCOSE, CAPILLARY

## 2010-12-26 LAB — POCT INR: INR: 2.9

## 2010-12-26 NOTE — Progress Notes (Signed)
Anti-Coagulation Progress Note  Patrick [name] is a 65 y.o. male who is currently on an anti-coagulation regimen.    RECENT RESULTS: Recent results are below, the most recent result is correlated with a dose of 45 mg. per week: Lab Results  Component Value Date   INR 2.9 12/26/2010   INR 2.8 12/11/2010   INR 1.8 12/04/2010    ANTI-COAG DOSE:   Latest dosing instructions   Total Sun Mon Tue Wed Thu Fri Sat   42.5 5 mg 7.5 mg 5 mg 7.5 mg 5 mg 7.5 mg 5 mg    (5 mg1) (5 mg1.5) (5 mg1) (5 mg1.5) (5 mg1) (5 mg1.5) (5 mg1)         ANTICOAG SUMMARY: Anticoagulation Episode Summary              Current INR goal 2.0-3.0 Next INR check 01/15/2011   INR from last check 2.9 (12/26/2010)     Weekly max dose (mg)  Target end date Indefinite   Indications ATRIAL FIBRILLATION, PAROXYSMAL, CHRONIC, Long term current use of anticoagulant   INR check location  Preferred lab    Send INR reminders to [facility]   Comments        Provider Role Specialty Phone number   Larey Dresser  Internal Medicine [phone]        ANTICOAG TODAY: Anticoagulation Summary as of 12/26/2010              INR goal 2.0-3.0     Selected INR 2.9 (12/26/2010) Next INR check 01/15/2011   Weekly max dose (mg)  Target end date Indefinite   Indications ATRIAL FIBRILLATION, PAROXYSMAL, CHRONIC, Long term current use of anticoagulant    Anticoagulation Episode Summary              INR check location  Preferred lab    Send INR reminders to ANTICOAG [facility]   Comments        Provider Role Specialty Phone number   Larey Dresser  Internal Medicine [phone]        PATIENT INSTRUCTIONS: Patient Instructions  Patient instructed to take medications as defined in the Anti-coagulation Track section of this encounter.  Patient instructed to take  today's dose.  Patient verbalized understanding of these instructions.        FOLLOW-UP Return in 3 weeks (on 01/15/2011) for Follow up INR.  [name],  III Pharm.D., CACP

## 2010-12-26 NOTE — Progress Notes (Signed)
  Subjective:    Patient ID: Patrick [name], male    DOB: 20-Sep-1945, 65 y.o.   MRN: [id number]  HPI  Patrick [name] is a 65 year old man who presents today for follow up from his last appointment.  He was recently hospitalized because of a stroke as well as symptomatic bradycardia.  At his last appointment he was doing well except for his heart rate was 44.  His diltiazem was decreased by 30 mg twice daily to 150 mg twice daily.  He has been doing well since the decrease.    He continues to have some psychomotor retardation following the stroke.  He continues to work with OT and speech therapy to help regain function.    He has still not picked up his CPAP. He was given the prescription at his last appointment.  [facility] asked for additional information and we have provided it.   Review of Systems    Constitutional: Denies fever, chills, diaphoresis, appetite change and fatigue.  HEENT: Denies photophobia, eye pain, redness, hearing loss, ear pain, congestion, sore throat, rhinorrhea, sneezing, mouth sores, trouble swallowing, neck pain, neck stiffness and tinnitus.   Respiratory: Denies SOB, DOE, cough, chest tightness,  and wheezing.   Cardiovascular: Denies chest pain, palpitations and leg swelling.  Gastrointestinal: Denies nausea, vomiting, abdominal pain, diarrhea, constipation, blood in stool and abdominal distention.  Genitourinary: Denies dysuria, urgency, frequency, hematuria, flank pain and difficulty urinating.  Musculoskeletal: Denies myalgias, back pain, joint swelling, arthralgias and gait problem.  Skin: Denies pallor, rash and wound.  Neurological: Denies dizziness, seizures, syncope, weakness, light-headedness, numbness and headaches.  Hematological: Denies adenopathy. Easy bruising, personal or family bleeding history  Psychiatric/Behavioral: Denies suicidal ideation, mood changes, confusion, nervousness, sleep disturbance and agitation  Objective:   Physical Exam   Constitutional: Vital signs reviewed.  Patient is a well-developed and well-nourished man in no acute distress and cooperative with exam. Alert and oriented x3.  Head: Normocephalic and atraumatic Ear: TM normal bilaterally Mouth: no erythema or exudates, MMM Eyes: PERRL, EOMI, conjunctivae normal, No scleral icterus.  Neck: Supple, Trachea midline normal ROM, No JVD, mass, thyromegaly, or carotid bruit present.  Cardiovascular: bradycardic rate, regular rhythm, S1 normal, S2 normal, no MRG, pulses symmetric and intact bilaterally Pulmonary/Chest: CTAB, no wheezes, rales, or rhonchi Abdominal: Soft. Non-tender, non-distended, bowel sounds are normal, no masses, organomegaly, or guarding present.  GU: no CVA tenderness Musculoskeletal: No joint deformities, erythema, or stiffness, ROM full and no nontender Hematology: no cervical, inginal, or axillary adenopathy.  Neurological: A&O x3, Strength is normal and symmetric bilaterally, cranial nerve II-XII are grossly intact, no focal motor deficit, sensory intact to light touch bilaterally.  Skin: Warm, dry and intact. No rash, cyanosis, or clubbing.  Psychiatric: Normal mood and affect. speech and behavior is normal. Judgment and thought content normal. Cognition and memory are normal.   Assessment & Plan:

## 2010-12-26 NOTE — Patient Instructions (Signed)
Continue taking all your medications as prescribed.  Reading, puzzles, and activity are all good things to help you recover from the stroke.  Follow up in the last part of July to recheck your A1C and to follow up on your blood pressure.

## 2010-12-26 NOTE — Patient Instructions (Signed)
Patient instructed to take medications as defined in the Anti-coagulation Track section of this encounter.  Patient instructed to take today's dose.  Patient verbalized understanding of these instructions.    

## 2010-12-29 NOTE — Consult Note (Signed)
Patrick [name], Patrick [name]                ACCOUNT NO.:  [id number]  MEDICAL RECORD NO.:  [id number]           PATIENT TYPE:  I  LOCATION:  2109                         FACILITY:  [facility]  PHYSICIAN:  Thayer Headings, M.D. DATE OF BIRTH:  1945-09-13  DATE OF CONSULTATION:  11/22/2010 DATE OF DISCHARGE:                                CONSULTATION   PRIMARY CARDIOLOGIST:  Has been seen by Dr. Lia Foyer in 2007.  PRIMARY CARE PROVIDER:  Trish Fountain, MD  REASON FOR CONSULTATION:  Atrial fibrillation with slow ventricular response.  HISTORY OF PRESENT ILLNESS:  This is a 65 year old African American gentleman with prior history of CVA in 2007, paroxysmal atrial fibrillation, and nonobstructive coronary artery disease per cardiac catheterization in 2005 who is being evaluated for stroke symptoms.  Per the patient's wife, he complained of feeling poorly yesterday, no specific complaints.  No fevers or chills.  His son called in the afternoon and the patient was unaware that it was his son and thought it was his nephew.  The patient then began to have garbled speech.  At this time, the patient's wife took him to [facility] [facility] [employer].  His glucose was elevated 404, his INR was therapeutic at 2.4.  His initial point-of- care markers were negative x1.  A CT of the head did show hypoattenuation within the anterior left thalamus, consistent with an infarct of indeterminate age.  This is new from prior studies in 2007. The patient has since been transferred to [facility] for further evaluation. On telemetry, the patient has been noted to be in atrial fibrillation with slow ventricular response between 38 and 60.  Cardiology was asked to consult on the patient.  The patient has not used full sentences during evaluation but will answer questions with yes or no.  He denies any chest pain, shortness of breath, or dyspnea on exertion.  He also denies any orthostatic-type symptoms as well as no  presyncope or frank syncope.  The patient states he is still weak, all over.  PAST MEDICAL HISTORY: 1. Nonobstructive coronary artery disease per cardiac catheterization     in 2005. 2. Hypertension, history of hypertensive emergency x2. 3. Paroxysmal atrial fibrillation, has been on Coumadin since 2008. 4. History of right cerebellar hemorrhage in 2007. 5. Insulin-dependent diabetes mellitus. 6. Depression. 7. Chronic renal insufficiency. 8. Hyperlipidemia. 9. Obstructive sleep apnea, currently not using a CPAP.  SOCIAL HISTORY:  The patient lives in [place] with his spouse.  He is a retired Animal nutritionist.  He has a history of tobacco abuse but quit greater than 30 years ago.  He denies any alcohol abuse.  FAMILY HISTORY:  His father passed away at the age of 26 from coronary artery disease.  His mother and siblings have a history of stroke as well as diabetes mellitus.  ALLERGIES:  No known drug allergies.  OUTPATIENT MEDICATIONS: 1. Aspirin 81 mg daily. 2. Benazepril 40 mg two tablets daily. 3. Glipizide 10 mg one-half tablet twice daily. 4. Humulin N 25 units subcu nightly. 5. Pravachol 40 mg two tablets daily. 6. Famotidine 40 mg daily. 7. Clonidine 0.3  mg twice daily. 8. Furosemide 120 mg daily. 9. Coumadin 5 mg. 10.He is discharged on Cardizem 180 mg twice daily. 11.Fish oil 1000 mg twice daily.  REVIEW OF SYSTEMS:  All pertinent positives and negatives as stated in HPI.  All other systems have been reviewed and are negative.  PHYSICAL EXAMINATION:  VITAL SIGNS:  Temperature 98.3, respirations 15, pulse 39-45, blood pressure 164-191 over 75-96, O2 saturation 100% on 2 liters, weight 92.1 kg. GENERAL:  This is a middle-aged gentleman with a depressed affect, he is in no acute distress. HEENT:  Normal. NECK:  Supple without JVD. HEART:  Bradycardic with S1 and S2.  No murmur, rub, or gallop noted. PMI is normal.  Pulses are 2+ and equal bilaterally. LUNGS:   Clear to auscultation anteriorly without wheezes, rales, or rhonchi. ABDOMEN:  Soft, nontender, obese, positive bowel sounds x4. EXTREMITIES:  Without clubbing, cyanosis, or edema. MUSCULOSKELETAL:  No joint deformities or effusions. NEUROLOGIC:  Alert and oriented x3, cranial nerves II through XII grossly intact.  Chest x-ray, MRI/MRA of the brain are pending. EKG showing atrial fibrillation with slow ventricular response at a rate of 40 beats per minute.  There are no acute changes.  LABORATORY DATA:  WBC 6.3, hemoglobin 14.2, hematocrit 41.3, platelets 206.  Sodium 139, potassium 3.6, chloride 100, bicarb 26, BUN 28, creatinine 2.1, glucose 432.  INR 2.4.  Point-of-care markers negative x1.  ASSESSMENT AND PLAN:  This is a 65 year old African American gentleman with known chronic atrial fibrillation now with a new cerebrovascular accident and bradycardia.  The patient is currently asymptomatic.  The patient's atrial fibrillation with slow ventricular response is felt to be secondary to the patient's new cerebrovascular accident as well as diltiazem use.  At this time, we would recommend holding diltiazem and only restarting as needed, woudl suggest restarting at a lower dose.  We recommend continuing to monitor on tele and watch for any pauses.  We will also recommend continuing Coumadin therapy if okay with stroke protocol.  At this time, no further EP evaluation is needed.  We will sign off and call for any questions.     Cecille Amsterdam, PA-C   ______________________________ Thayer Headings, M.D.    NB/MEDQ  D:  11/22/2010  T:  11/22/2010  Job:  [id number]  Electronically Signed by Pennie Rushing P.A. on 12/08/2010 06:31:00 PM Electronically Signed by Mertie Moores M.D. on 12/29/2010 04:46:44 PM

## 2011-01-03 NOTE — Assessment & Plan Note (Signed)
I encouraged them to contact [facility] to see if there was any other information for them to get their CPAP machine so he can start using it.

## 2011-01-03 NOTE — Assessment & Plan Note (Signed)
BP Readings from Last 3 Encounters:  12/26/10 141/91  12/11/10 128/70  10/27/10 134/83   Blood pressure today is mildly elevated from his previous visit with the decrease in the diltiazem.  His pulse however is better.  We will continue to monitor him and if he continues to be elevated at his follow up visit we will likely need to add HCTZ or something similar to his regimen.

## 2011-01-03 NOTE — Assessment & Plan Note (Signed)
He is due for his diabetes recheck in July. We will continue to monitor for now and he and his wife are encouraged to continue checking his sugars and to take his medications as prescribed.  His CBGs in clinic are better since his hospital discharge.

## 2011-01-09 ENCOUNTER — Other Ambulatory Visit (INDEPENDENT_AMBULATORY_CARE_PROVIDER_SITE_OTHER): Payer: Medicare Other | Admitting: *Deleted

## 2011-01-09 DIAGNOSIS — I4891 Unspecified atrial fibrillation: Secondary | ICD-10-CM

## 2011-01-09 DIAGNOSIS — Z7901 Long term (current) use of anticoagulants: Secondary | ICD-10-CM

## 2011-01-10 MED ORDER — WARFARIN SODIUM 5 MG PO TABS: 5.0000 mg | ORAL_TABLET | ORAL | Status: DC

## 2011-01-22 ENCOUNTER — Other Ambulatory Visit: Payer: Self-pay | Admitting: Internal Medicine

## 2011-02-12 ENCOUNTER — Ambulatory Visit (INDEPENDENT_AMBULATORY_CARE_PROVIDER_SITE_OTHER): Payer: Medicare Other | Admitting: Pharmacist

## 2011-02-12 DIAGNOSIS — I4891 Unspecified atrial fibrillation: Secondary | ICD-10-CM

## 2011-02-12 DIAGNOSIS — Z7901 Long term (current) use of anticoagulants: Secondary | ICD-10-CM

## 2011-02-12 LAB — POCT INR: INR: 3

## 2011-02-12 NOTE — Patient Instructions (Signed)
Patient instructed to take medications as defined in the Anti-coagulation Track section of this encounter.  Patient instructed to take today's dose.  Patient verbalized understanding of these instructions.    

## 2011-02-12 NOTE — Progress Notes (Signed)
Anti-Coagulation Progress Note  Patrick [name] is a 65 y.o. male who is currently on an anti-coagulation regimen.    RECENT RESULTS: Recent results are below, the most recent result is correlated with a dose of 42.5 mg. per week: Lab Results  Component Value Date   INR 3.00 02/12/2011   INR 2.9 12/26/2010   INR 2.8 12/11/2010    ANTI-COAG DOSE:   Latest dosing instructions   Total Sun Mon Tue Wed Thu Fri Sat   37.5 5 mg 5 mg 5 mg 7.5 mg 5 mg 5 mg 5 mg    (5 mg1) (5 mg1) (5 mg1) (5 mg1.5) (5 mg1) (5 mg1) (5 mg1)         ANTICOAG SUMMARY: Anticoagulation Episode Summary              Current INR goal 2.0-3.0 Next INR check 03/12/2011   INR from last check 3.00 (02/12/2011)     Weekly max dose (mg)  Target end date Indefinite   Indications ATRIAL FIBRILLATION, PAROXYSMAL, CHRONIC, Long term current use of anticoagulant   INR check location  Preferred lab    Send INR reminders to [facility]   Comments        Provider Role Specialty Phone number   Larey Dresser  Internal Medicine [phone]        ANTICOAG TODAY: Anticoagulation Summary as of 02/12/2011              INR goal 2.0-3.0     Selected INR 3.00 (02/12/2011) Next INR check 03/12/2011   Weekly max dose (mg)  Target end date Indefinite   Indications ATRIAL FIBRILLATION, PAROXYSMAL, CHRONIC, Long term current use of anticoagulant    Anticoagulation Episode Summary              INR check location  Preferred lab    Send INR reminders to [facility]   Comments        Provider Role Specialty Phone number   Larey Dresser  Internal Medicine [phone]        PATIENT INSTRUCTIONS: Patient Instructions  Patient instructed to take medications as defined in the Anti-coagulation Track section of this encounter.  Patient instructed to take today's dose.  Patient verbalized understanding of these instructions.        FOLLOW-UP Return in 4 weeks (on 03/12/2011) for Follow up INR.  [name],  III Pharm.D., CACP

## 2011-03-06 ENCOUNTER — Telehealth: Payer: Self-pay | Admitting: *Deleted

## 2011-03-06 NOTE — Telephone Encounter (Signed)
Pt called and left message that he is having blood in his urine, i have tried to call him back twice, left message that he would receive a call in am again if he does not hear from int med by 1000 he is to call clinic back for appt.

## 2011-03-07 ENCOUNTER — Encounter: Payer: Self-pay | Admitting: Internal Medicine

## 2011-03-07 ENCOUNTER — Telehealth: Payer: Self-pay | Admitting: *Deleted

## 2011-03-07 ENCOUNTER — Ambulatory Visit (INDEPENDENT_AMBULATORY_CARE_PROVIDER_SITE_OTHER): Payer: Medicare Other | Admitting: Internal Medicine

## 2011-03-07 VITALS — BP 161/92 | HR 57 | Temp 97.9°F | Ht 69.0 in | Wt 219.4 lb

## 2011-03-07 DIAGNOSIS — E119 Type 2 diabetes mellitus without complications: Secondary | ICD-10-CM

## 2011-03-07 DIAGNOSIS — R319 Hematuria, unspecified: Secondary | ICD-10-CM | POA: Insufficient documentation

## 2011-03-07 DIAGNOSIS — I4891 Unspecified atrial fibrillation: Secondary | ICD-10-CM

## 2011-03-07 LAB — CBC
MCH: 26 pg (ref 26.0–34.0)
MCHC: 32.7 g/dL (ref 30.0–36.0)
MCV: 79.5 fL (ref 78.0–100.0)
Platelets: 224 10*3/uL (ref 150–400)
RDW: 13.5 % (ref 11.5–15.5)
WBC: 4.5 10*3/uL (ref 4.0–10.5)

## 2011-03-07 LAB — POCT GLYCOSYLATED HEMOGLOBIN (HGB A1C): Hemoglobin A1C: 8.8

## 2011-03-07 NOTE — Progress Notes (Signed)
  Subjective:    Patient ID: Patrick [name], male    DOB: [birth year], 65 y.o.   MRN: [id number]  HPI: 65 year old man with past medical history significant for type 2 diabetes mellitus, hypertension , atrial fibrillation on chronic Coumadin comes to the clinic complaining of hematuria.  Patient is a very poor historian because of his slow speech and difficulty finding words after his stroke in April 2012. Patient states having an episode of hematuria on Saturday and then again on Monday. He reports having bright red blood but could not quantify. But clearly says that  there were no clots. He had some abdominal pain on the  same day which he described as a mild discomfort or soreness that lasted for a few minutes and got better when he lied down. Denies any dysuria ,urgency, frequency or hesitancy, fever, nausea or vomiting..    Review of Systems  Constitutional: Negative for fever, chills, appetite change and fatigue.  HENT: Negative for nosebleeds, congestion, rhinorrhea and postnasal drip.   Eyes: Negative for visual disturbance.  Respiratory: Negative for apnea, cough, choking, chest tightness, shortness of breath, wheezing and stridor.   Cardiovascular: Negative for chest pain, palpitations and leg swelling.  Gastrointestinal: Negative for constipation and rectal pain.  Genitourinary: Positive for hematuria. Negative for dysuria, discharge, scrotal swelling, penile pain and testicular pain.  Musculoskeletal: Negative for back pain and arthralgias.  Neurological: Negative for headaches.  Hematological: Negative for adenopathy.       Objective:   Physical Exam  Constitutional: He is oriented to person, place, and time. He appears well-developed and well-nourished. No distress.  HENT:  Head: Normocephalic and atraumatic.  Mouth/Throat: No oropharyngeal exudate.  Eyes: Conjunctivae and EOM are normal. Pupils are equal, round, and reactive to light.  Neck: Normal range of motion. Neck  supple. No JVD present. No tracheal deviation present. No thyromegaly present.  Cardiovascular: Normal rate, regular rhythm, normal heart sounds and intact distal pulses.  Exam reveals no gallop and no friction rub.   No murmur heard. Pulmonary/Chest: Effort normal and breath sounds normal. No stridor. No respiratory distress. He has no wheezes. He has no rales. He exhibits no tenderness.  Abdominal: Soft. Bowel sounds are normal. He exhibits no distension and no mass. There is no tenderness. There is no rebound and no guarding.  Genitourinary: No penile tenderness.       No obvious ulcer or abnormality.  Musculoskeletal: Normal range of motion.  Lymphadenopathy:    He has no cervical adenopathy.  Neurological: He is alert and oriented to person, place, and time. He has normal reflexes. He displays normal reflexes. No cranial nerve deficit. Coordination normal.  Skin: Skin is warm. He is not diaphoretic.          Assessment & Plan:

## 2011-03-07 NOTE — Telephone Encounter (Signed)
Pt seen in clinic today.  

## 2011-03-07 NOTE — Assessment & Plan Note (Signed)
He did not get his glucose meter today. But his A1c has improved significantly from last visit. We'll continue the current regimen for now. Lab Results  Component Value Date   HGBA1C 8.8 03/07/2011   HGBA1C  Value: 11.8 (NOTE)                                                                       According to the ADA Clinical Practice Recommendations for 2011, when HbA1c is used as a screening test:   >=6.5%   Diagnostic of Diabetes Mellitus           (if abnormal result  is confirmed)  5.7-6.4%   Increased risk of developing Diabetes Mellitus  References:Diagnosis and Classification of Diabetes Mellitus,Diabetes [id number] 1):S62-S69 and Standards of Medical Care in         Diabetes - 2011,Diabetes [id number]  (Suppl 1):S11-S61.* 11/22/2010   CREATININE 2.04* 12/11/2010   CREATININE 2.31* 11/30/2010   MICROALBUR 37.60* 10/27/2010   MICRALBCREAT 313.9* 10/27/2010   CHOL  Value: 146        ATP III CLASSIFICATION:  <200     mg/dL   Desirable  200-239  mg/dL   Borderline High  >=240    mg/dL   High        11/23/2010   HDL 46 11/23/2010   TRIG 135 11/23/2010    Last eye exam and foot exam:    Component Value Date/Time   HMDIABEYEEXA no diabetic retinopathy 04/10/2010   HMDIABFOOTEX done 07/07/2010    Assessment: Diabetes control: controlled Progress toward goals: improved

## 2011-03-07 NOTE — Telephone Encounter (Signed)
Pt called with c/o blood in urine since Saturday.  It's on and off.  Bright red blood. Pt on coumadin.  Will see today at 65

## 2011-03-07 NOTE — Patient Instructions (Addendum)
Please keep up with your  appointment with your PCP ( Dr. Obie Dredge). Please take 2.5 mg of coumadin for Wednesday and Thursday and then start back on the dose suggested by DR. Groce. Please call the clinic if you have more episodes of blood in urine.

## 2011-03-07 NOTE — Assessment & Plan Note (Signed)
His INR was 3.1 today. He was advised to take 2.5 mg of Coumadin for today and tomorrow and then resume back to the dose regimen suggested by Dr. Elie Confer. He was advised to keep up with his followup appointment with Dr. Elie Confer on 03/12/2011. Patient voices clear understanding.

## 2011-03-07 NOTE — Assessment & Plan Note (Addendum)
He complains of 2 episodes of hematuria over last 4 days.Our clinic urine dip stick was negative for blood but will get UA her accurate analysis. We reviewed his renal ultrasound from 2007 which showed mild prostatic enlargement. Differentials for his hematuria include BPH versus Nephrolithiasis versus tumors( renal, bladder or prostate) vs coagulopathy from coumadin. His coagulopathy from Coumadin is very less likely with INR of 3.1. We'll get a CT abdomen/ pelvis without contrast to evaluate for possible stones or  mass. Will discuss the findings with his PCP and the need to get cystoscopy depending on the CT findings. We'll also get a CBC and BMET today. Will also check serum PSA levels that would help in ruling out prostate cancer.

## 2011-03-08 LAB — URINALYSIS, ROUTINE W REFLEX MICROSCOPIC
Hgb urine dipstick: NEGATIVE
Ketones, ur: NEGATIVE mg/dL
Nitrite: NEGATIVE
Protein, ur: 30 mg/dL — AB
Urobilinogen, UA: 1 mg/dL (ref 0.0–1.0)

## 2011-03-08 LAB — PSA: PSA: 2.37 ng/mL (ref <=4.00)

## 2011-03-08 LAB — URINALYSIS, MICROSCOPIC ONLY: Bacteria, UA: NONE SEEN

## 2011-03-08 LAB — BASIC METABOLIC PANEL WITH GFR
Calcium: 8.9 mg/dL (ref 8.4–10.5)
Chloride: 102 mEq/L (ref 96–112)
Creat: 2.11 mg/dL — ABNORMAL HIGH (ref 0.50–1.35)
GFR, Est African American: 39 mL/min — ABNORMAL LOW (ref >60)
GFR, Est Non African American: 32 mL/min — ABNORMAL LOW (ref >60)

## 2011-03-09 ENCOUNTER — Encounter: Payer: Medicare Other | Admitting: Internal Medicine

## 2011-03-12 ENCOUNTER — Ambulatory Visit (INDEPENDENT_AMBULATORY_CARE_PROVIDER_SITE_OTHER): Payer: Medicare Other | Admitting: Pharmacist

## 2011-03-12 DIAGNOSIS — Z7901 Long term (current) use of anticoagulants: Secondary | ICD-10-CM

## 2011-03-12 DIAGNOSIS — I4891 Unspecified atrial fibrillation: Secondary | ICD-10-CM

## 2011-03-12 LAB — POCT INR: INR: 2.1

## 2011-03-12 NOTE — Progress Notes (Signed)
Agree with the above

## 2011-03-12 NOTE — Progress Notes (Signed)
Anti-Coagulation Progress Note  Patrick [name] is a 66 y.o. male who is currently on an anti-coagulation regimen.    RECENT RESULTS: Recent results are below, the most recent result is correlated with a dose of 37.5 mg. per week: Lab Results  Component Value Date   INR 2.10 03/12/2011   INR 3.1 03/07/2011   INR 3.00 02/12/2011    ANTI-COAG DOSE:   Latest dosing instructions   Total Sun Mon Tue Wed Thu Fri Sat   40 5 mg 7.5 mg 5 mg 7.5 mg 5 mg 5 mg 5 mg    (5 mg1) (5 mg1.5) (5 mg1) (5 mg1.5) (5 mg1) (5 mg1) (5 mg1)         ANTICOAG SUMMARY: Anticoagulation Episode Summary              Current INR goal 2.0-3.0 Next INR check 04/09/2011   INR from last check 2.10 (03/12/2011)     Weekly max dose (mg)  Target end date Indefinite   Indications ATRIAL FIBRILLATION, PAROXYSMAL, CHRONIC, Long term current use of anticoagulant   INR check location  Preferred lab    Send INR reminders to [facility]   Comments        Provider Role Specialty Phone number   Larey Dresser  Internal Medicine [phone]        ANTICOAG TODAY: Anticoagulation Summary as of 03/12/2011              INR goal 2.0-3.0     Selected INR 2.10 (03/12/2011) Next INR check 04/09/2011   Weekly max dose (mg)  Target end date Indefinite   Indications ATRIAL FIBRILLATION, PAROXYSMAL, CHRONIC, Long term current use of anticoagulant    Anticoagulation Episode Summary              INR check location  Preferred lab    Send INR reminders to [facility]   Comments        Provider Role Specialty Phone number   Larey Dresser  Internal Medicine [phone]        PATIENT INSTRUCTIONS: Patient Instructions  Patient instructed to take medications as defined in the Anti-coagulation Track section of this encounter.  Patient instructed to take today's dose.  Patient verbalized understanding of these instructions.        FOLLOW-UP Return in 4 weeks (on 04/09/2011) for Follow up INR.  [name],  III Pharm.D., CACP

## 2011-03-12 NOTE — Patient Instructions (Signed)
Patient instructed to take medications as defined in the Anti-coagulation Track section of this encounter.  Patient instructed to take today's dose.  Patient verbalized understanding of these instructions.    

## 2011-03-20 ENCOUNTER — Other Ambulatory Visit (HOSPITAL_COMMUNITY): Payer: Medicare Other

## 2011-03-22 ENCOUNTER — Encounter: Payer: Self-pay | Admitting: Internal Medicine

## 2011-03-22 ENCOUNTER — Other Ambulatory Visit: Payer: Self-pay | Admitting: Internal Medicine

## 2011-03-22 DIAGNOSIS — E876 Hypokalemia: Secondary | ICD-10-CM

## 2011-03-22 MED ORDER — POTASSIUM CHLORIDE ER 10 MEQ PO CPCR: 10.0000 meq | ORAL_CAPSULE | Freq: Two times a day (BID) | ORAL | Status: DC

## 2011-03-22 NOTE — Progress Notes (Deleted)
  Subjective:    Patient ID: Patrick [name], male    DOB: 29-Mar-1946, 65 y.o.   MRN: [id number]  HPI    Review of Systems     Objective:   Physical Exam        Assessment & Plan:

## 2011-03-22 NOTE — Progress Notes (Signed)
Called the patient to discuss his test results. Started him on potassium supplementation- called the script for his potassium at the pharmacy. Will recheck his BMET with next visit.

## 2011-04-04 ENCOUNTER — Other Ambulatory Visit: Payer: Self-pay | Admitting: *Deleted

## 2011-04-04 DIAGNOSIS — E119 Type 2 diabetes mellitus without complications: Secondary | ICD-10-CM

## 2011-04-04 MED ORDER — INSULIN NPH (HUMAN) (ISOPHANE) 100 UNIT/ML ~~LOC~~ SUSP: 25.0000 [IU] | Freq: Every day | SUBCUTANEOUS | Status: DC

## 2011-04-09 ENCOUNTER — Other Ambulatory Visit: Payer: Self-pay | Admitting: Internal Medicine

## 2011-04-09 ENCOUNTER — Ambulatory Visit (INDEPENDENT_AMBULATORY_CARE_PROVIDER_SITE_OTHER): Payer: Medicare Other | Admitting: Pharmacist

## 2011-04-09 ENCOUNTER — Encounter: Payer: Self-pay | Admitting: Internal Medicine

## 2011-04-09 ENCOUNTER — Ambulatory Visit (INDEPENDENT_AMBULATORY_CARE_PROVIDER_SITE_OTHER): Payer: Medicare Other | Admitting: Internal Medicine

## 2011-04-09 DIAGNOSIS — Z7901 Long term (current) use of anticoagulants: Secondary | ICD-10-CM

## 2011-04-09 DIAGNOSIS — I10 Essential (primary) hypertension: Secondary | ICD-10-CM

## 2011-04-09 DIAGNOSIS — R319 Hematuria, unspecified: Secondary | ICD-10-CM

## 2011-04-09 DIAGNOSIS — E119 Type 2 diabetes mellitus without complications: Secondary | ICD-10-CM

## 2011-04-09 DIAGNOSIS — G4730 Sleep apnea, unspecified: Secondary | ICD-10-CM

## 2011-04-09 DIAGNOSIS — I4891 Unspecified atrial fibrillation: Secondary | ICD-10-CM

## 2011-04-09 MED ORDER — GLIPIZIDE 10 MG PO TABS: 15.0000 mg | ORAL_TABLET | Freq: Two times a day (BID) | ORAL | Status: DC

## 2011-04-09 MED ORDER — "INSULIN SYRINGE-NEEDLE U-100 31G X 5/16"" 0.5 ML MISC": 1.0000 | Freq: Three times a day (TID) | Status: DC

## 2011-04-09 MED ORDER — GLUCOSE BLOOD VI STRP: ORAL_STRIP | Status: DC

## 2011-04-09 MED ORDER — LANCETS 30G MISC: 1.0000 | Freq: Three times a day (TID) | Status: DC

## 2011-04-09 MED ORDER — DILTIAZEM HCL 90 MG PO TABS: 90.0000 mg | ORAL_TABLET | Freq: Two times a day (BID) | ORAL | Status: DC

## 2011-04-09 NOTE — Progress Notes (Signed)
Subjective:   Patient ID: Patrick [name] male   DOB: [birth year] 65 y.o.   MRN: [id number]  HPI: Patrick [name] is a 65 y.o. man who presents to clinic today for follow up of his last clinic visit as well as management of his chronic medical conditions including diabetes and HTN.  He is a poor historian secondary to his recent stroke that affected his memory and speech.  His wife is with him today and states that he has been taking his medications.  He states that the blood in his urine has resolved and has not recurred.   He continues to take his insulin NPH and denies any hypoglycemia events.  He has also been watching what he is eating as well per his wife's report.    He was diagnosed with sleep apnea in January and has still not been able to get his CPAP machine.  They haven't heard from the company that is supposed to be giving them the machine.  He continues to snore at night and his wife states that he continues to sound like he stops breathing at times.  Past Medical History  Diagnosis Date  . Diabetes mellitus type II     Insulin dependent  . Hypertension   . Sleep apnea 07/2010  . CKD (chronic kidney disease), stage II   . Paroxysmal atrial fibrillation     on coumadin  . CAD (coronary artery disease)   . Depression   . Gout   . Hyperlipidemia   . 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 aspirin 81 MG EC tablet Take 81 mg by mouth daily.        . benazepril (LOTENSIN) 40 MG tablet Take 2 tablets (80 mg total) by mouth daily.  180 tablet  4  . Blood Glucose Monitoring Suppl (ACCU-CHEK AVIVA PLUS) W/DEVICE KIT 1 each by Does not apply route as needed.  1 kit  0  . cloNIDine (CATAPRES) 0.2 MG tablet Take 1 tablet (0.2 mg total) by mouth 2 (two) times daily.  180 tablet  3  . cloNIDine (CATAPRES) 0.3 MG tablet TAKE 1 TABLET TWICE DAILY  60 tablet  4  . diltiazem  (CARDIZEM) 60 MG tablet Take 1 tablet (60 mg total) by mouth 2 (two) times daily. Take one 90 mg tablet and one 60 mg tablet twice daily.  90 tablet  11  . diltiazem (CARDIZEM) 90 MG tablet Take 1 tablet (90 mg total) by mouth 2 (two) times daily. Take one 90 mg tablet and one 60 mg tablet twice daily.  180 tablet  4  . famotidine (PEPCID) 20 MG tablet TAKE TWO TABLETS BY MOUTH EVERY DAY  60 tablet  6  . glipiZIDE (GLUCOTROL) 10 MG tablet Take 1.5 tablets (15 mg total) by mouth 2 (two) times daily.  135 tablet  4  . glucose blood (ACCU-CHEK ADVANTAGE TEST) test strip Use as instructed  100 each  12  . glucose blood (TRUETRACK TEST) test strip Use to test blood sugar 2-3 times daily       . insulin NPH (NOVOLIN N) 100 UNIT/ML injection Inject 25 Units into the skin at bedtime.  10 mL  6  . Insulin Syringe-Needle U-100 (ELITE-THIN INS SYR .5CC/31G) 31G X 5/16" 0.5 ML MISC Use to inject insulin daily       . Lancets (ACCU-CHEK MULTICLIX) lancets Use as  instructed  100 each  12  . Lancets 30G MISC Use to test blood glucose 2-3 times daily       . Omega-3 Fatty Acids (FISH OIL) 1000 MG CAPS Take 1 capsule by mouth 2 (two) times daily.        . potassium chloride (MICRO-K) 10 MEQ CR capsule Take 1 capsule (10 mEq total) by mouth 2 (two) times daily.  30 capsule  0  . pravastatin (PRAVACHOL) 40 MG tablet Take 2 tablets (80 mg total) by mouth daily.  180 tablet  4  . warfarin (COUMADIN) 5 MG tablet Take 1 tablet (5 mg total) by mouth as directed.  115 tablet  1   No family history on file. History   Social History  . Marital Status: Married    [name]    Number of Children: N/A  . Years of Education: N/A   Social History Main Topics  . Smoking status: Former Smoker    Types: Cigarettes    Quit date: 10/26/1976  . Smokeless tobacco: Never Used  . Alcohol Use: No  . Drug Use: No  . Sexually Active: None   Other Topics Concern  . None   Social History Narrative  . None   Review  of Systems: Constitutional: Denies fever, chills, diaphoresis, appetite change and fatigue.  HEENT: Denies photophobia, eye pain, redness, hearing loss, ear pain, congestion, sore throat, rhinorrhea, sneezing, mouth sores, trouble swallowing, neck pain, neck stiffness and tinnitus.   Respiratory: Denies SOB, DOE, cough, chest tightness,  and wheezing.   Cardiovascular: Denies chest pain, palpitations and leg swelling.  Gastrointestinal: Denies nausea, vomiting, abdominal pain, diarrhea, constipation, blood in stool and abdominal distention.  Genitourinary: Denies dysuria, urgency, frequency, hematuria, flank pain and difficulty urinating.  Musculoskeletal: Denies myalgias, back pain, joint swelling, arthralgias and gait problem.  Skin: Denies pallor, rash and wound.  Neurological: Denies dizziness, seizures, syncope, weakness, light-headedness, numbness and headaches.  Hematological: Denies adenopathy. Easy bruising, personal or family bleeding history  Psychiatric/Behavioral: Denies suicidal ideation, mood changes, confusion, nervousness, sleep disturbance and agitation  Objective:  Physical Exam: Filed Vitals:   04/09/11 1113  BP: 140/72  Pulse: 52  Temp: 96.8 F (36 C)  TempSrc: Oral  Height: 5' 9.5" (1.765 m)  Weight: 216 lb (97.977 kg)  SpO2: 100%   Constitutional: Vital signs reviewed.  Patient is a well-developed and well-nourished man in no acute distress and cooperative with exam. Alert and oriented x3.   Head: Normocephalic and atraumatic Ear: TM normal bilaterally Mouth: no erythema or exudates, MMM Eyes: PERRL, EOMI, conjunctivae normal, No scleral icterus.  Neck: Supple, Trachea midline normal ROM, No JVD, mass, thyromegaly, or carotid bruit present.  Cardiovascular: IRR, S1 normal, S2 normal, no MRG, pulses symmetric and intact bilaterally Pulmonary/Chest: CTAB, no wheezes, rales, or rhonchi Abdominal: Soft. Non-tender, non-distended, bowel sounds are normal, no  masses, organomegaly, or guarding present.  GU: no CVA tenderness Musculoskeletal: No joint deformities, erythema, or stiffness, ROM full and no nontender Hematology: no cervical, inginal, or axillary adenopathy.  Neurological: A&O x3, speech is slowed. Strength is normal and symmetric bilaterally, cranial nerve II-XII are grossly intact, no focal motor deficit, sensory intact to light touch bilaterally.  Skin: Warm, dry and intact. No rash, cyanosis, or clubbing.  Psychiatric: Normal mood and affect. speech and behavior is normal. Judgment and thought content normal. Cognition appears normal.  Memory are mildly impaired.   Assessment & Plan:

## 2011-04-09 NOTE — Progress Notes (Signed)
Anti-Coagulation Progress Note  Patrick [name] is a 65 y.o. male who is currently on an anti-coagulation regimen.    RECENT RESULTS: Recent results are below, the most recent result is correlated with a dose of 40 mg. per week: Lab Results  Component Value Date   INR 3.60 04/09/2011   INR 2.10 03/12/2011   INR 3.1 03/07/2011    ANTI-COAG DOSE:   Latest dosing instructions   Total Sun Mon Tue Wed Thu Fri Sat   37.5 5 mg 7.5 mg 5 mg 5 mg 5 mg 5 mg 5 mg    (5 mg1) (5 mg1.5) (5 mg1) (5 mg1) (5 mg1) (5 mg1) (5 mg1)         ANTICOAG SUMMARY: Anticoagulation Episode Summary              Current INR goal 2.0-3.0 Next INR check 05/07/2011   INR from last check 3.60! (04/09/2011)     Weekly max dose (mg)  Target end date Indefinite   Indications ATRIAL FIBRILLATION, PAROXYSMAL, CHRONIC, Long term current use of anticoagulant   INR check location  Preferred lab    Send INR reminders to [facility] IMP   Comments        Provider Role Specialty Phone number   Larey Dresser  Internal Medicine [phone]        ANTICOAG TODAY: Anticoagulation Summary as of 04/09/2011              INR goal 2.0-3.0     Selected INR 3.60! (04/09/2011) Next INR check 05/07/2011   Weekly max dose (mg)  Target end date Indefinite   Indications ATRIAL FIBRILLATION, PAROXYSMAL, CHRONIC, Long term current use of anticoagulant    Anticoagulation Episode Summary              INR check location  Preferred lab    Send INR reminders to [facility]   Comments        Provider Role Specialty Phone number   Larey Dresser  Internal Medicine [phone]        PATIENT INSTRUCTIONS: Patient Instructions  Patient instructed to take medications as defined in the Anti-coagulation Track section of this encounter.  Patient instructed to take today's dose.  Patient verbalized understanding of these instructions.        FOLLOW-UP Return in 4 weeks (on 05/07/2011) for Follow up INR.  [name],  III Pharm.D., CACP

## 2011-04-09 NOTE — Patient Instructions (Signed)
Patient instructed to take medications as defined in the Anti-coagulation Track section of this encounter.  Patient instructed to take today's dose.  Patient verbalized understanding of these instructions.    

## 2011-04-09 NOTE — Patient Instructions (Signed)
Continue taking all your medications as prescribed.  Follow up in November or December for a recheck of your diabetes.

## 2011-04-10 ENCOUNTER — Other Ambulatory Visit: Payer: Self-pay | Admitting: Dietician

## 2011-04-10 NOTE — Telephone Encounter (Signed)
Called Mr. Bethancourt. He says he uses True track meter and does not use a specific lancet brand. He says he  does not desire any specific one. Called [employer] and gave them this information along with the Dx code of 250.00.

## 2011-04-15 ENCOUNTER — Encounter: Payer: Self-pay | Admitting: Internal Medicine

## 2011-04-15 NOTE — Assessment & Plan Note (Signed)
Lab Results  Component Value Date   HGBA1C 8.8 03/07/2011   HGBA1C  Value: 11.8  11/22/2010   HGBA1C 11.0 10/27/2010   Lab Results  Component Value Date   MICROALBUR 37.60* 10/27/2010   LDLCALC  Value: 73        11/23/2010   CREATININE 2.11* 03/07/2011   His A1C is much better since his stroke likely due to increased compliance.  We will continue to monitor and work to get him to goal to decrease his stroke risk.

## 2011-04-15 NOTE — Assessment & Plan Note (Signed)
We will rewrite the script today and have Patrick [name] follow up on this to get the CPAP machine to him.  This will help with his diabetes, HTN, and HLD as well as decrease the risk for subacute strokes due to decreased oxygenation.

## 2011-04-15 NOTE — Assessment & Plan Note (Signed)
BP Readings from Last 3 Encounters:  04/09/11 140/72  03/07/11 161/92  12/26/10 141/91   Blood pressure today is better the previously.  I think with treatment of his sleep apnea this will improve even more.  Again this is likely better due to better compliance with his medications.

## 2011-04-15 NOTE — Assessment & Plan Note (Signed)
This has resolved.  He is on chronic coumadin therapy and the week prior to this his INR was 3 so it may be from mild irritation or possibly prostatitis.  He has no current symptoms of BPH or prostate cancer so we will continue to monitor.

## 2011-05-01 LAB — GLUCOSE, CAPILLARY: Glucose-Capillary: 137 — ABNORMAL HIGH

## 2011-05-07 ENCOUNTER — Ambulatory Visit (INDEPENDENT_AMBULATORY_CARE_PROVIDER_SITE_OTHER): Payer: Medicare Other | Admitting: Pharmacist

## 2011-05-07 DIAGNOSIS — Z7901 Long term (current) use of anticoagulants: Secondary | ICD-10-CM

## 2011-05-07 DIAGNOSIS — I4891 Unspecified atrial fibrillation: Secondary | ICD-10-CM

## 2011-05-07 LAB — POCT INR: INR: 2.9

## 2011-05-07 NOTE — Progress Notes (Signed)
Anti-Coagulation Progress Note  Patrick [name] is a 65 y.o. male who is currently on an anti-coagulation regimen.    RECENT RESULTS: Recent results are below, the most recent result is correlated with a dose of 37.5 mg. per week: Lab Results  Component Value Date   INR 2.90 05/07/2011   INR 3.60 04/09/2011   INR 2.10 03/12/2011    ANTI-COAG DOSE:   Latest dosing instructions   Total Sun Mon Tue Wed Thu Fri Sat   35 5 mg 5 mg 5 mg 5 mg 5 mg 5 mg 5 mg    (5 mg1) (5 mg1) (5 mg1) (5 mg1) (5 mg1) (5 mg1) (5 mg1)         ANTICOAG SUMMARY: Anticoagulation Episode Summary              Current INR goal 2.0-3.0 Next INR check 06/04/2011   INR from last check 2.90 (05/07/2011)     Weekly max dose (mg)  Target end date Indefinite   Indications ATRIAL FIBRILLATION, PAROXYSMAL, CHRONIC, Long term current use of anticoagulant   INR check location  Preferred lab    Send INR reminders to [facility] IMP   Comments        Provider Role Specialty Phone number   Larey Dresser  Internal Medicine [phone]        ANTICOAG TODAY: Anticoagulation Summary as of 05/07/2011              INR goal 2.0-3.0     Selected INR 2.90 (05/07/2011) Next INR check 06/04/2011   Weekly max dose (mg)  Target end date Indefinite   Indications ATRIAL FIBRILLATION, PAROXYSMAL, CHRONIC, Long term current use of anticoagulant    Anticoagulation Episode Summary              INR check location  Preferred lab    Send INR reminders to [facility]   Comments        Provider Role Specialty Phone number   Larey Dresser  Internal Medicine [phone]        PATIENT INSTRUCTIONS: Patient Instructions  Patient instructed to take medications as defined in the Anti-coagulation Track section of this encounter.  Patient instructed to take today's dose.  Patient verbalized understanding of these instructions.        FOLLOW-UP Return in 4 weeks (on 06/04/2011) for Follow up INR.  [name],  III Pharm.D., CACP

## 2011-05-07 NOTE — Progress Notes (Signed)
Indication: Atrial fibrillation Duration: Life long

## 2011-05-07 NOTE — Patient Instructions (Signed)
Patient instructed to take medications as defined in the Anti-coagulation Track section of this encounter.  Patient instructed to take today's dose.  Patient verbalized understanding of these instructions.    

## 2011-05-10 ENCOUNTER — Encounter: Payer: Self-pay | Admitting: Internal Medicine

## 2011-05-10 ENCOUNTER — Ambulatory Visit (INDEPENDENT_AMBULATORY_CARE_PROVIDER_SITE_OTHER): Payer: Medicare Other | Admitting: Internal Medicine

## 2011-05-10 DIAGNOSIS — I10 Essential (primary) hypertension: Secondary | ICD-10-CM

## 2011-05-10 DIAGNOSIS — E785 Hyperlipidemia, unspecified: Secondary | ICD-10-CM

## 2011-05-10 DIAGNOSIS — N182 Chronic kidney disease, stage 2 (mild): Secondary | ICD-10-CM

## 2011-05-10 DIAGNOSIS — E119 Type 2 diabetes mellitus without complications: Secondary | ICD-10-CM

## 2011-05-10 DIAGNOSIS — G4730 Sleep apnea, unspecified: Secondary | ICD-10-CM

## 2011-05-10 LAB — BASIC METABOLIC PANEL
CO2: 28 mEq/L (ref 19–32)
Chloride: 104 mEq/L (ref 96–112)
Creat: 1.98 mg/dL — ABNORMAL HIGH (ref 0.50–1.35)
Potassium: 3.8 mEq/L (ref 3.5–5.3)
Sodium: 140 mEq/L (ref 135–145)

## 2011-05-10 LAB — GLUCOSE, CAPILLARY: Glucose-Capillary: 176 mg/dL — ABNORMAL HIGH (ref 70–99)

## 2011-05-10 NOTE — Patient Instructions (Signed)
Continue taking your medications as prescribed.  When you get your CPAP machine use it every night when you're sleeping.  Follow up in December to recheck your cholesterol and diabetes control.  Bring your meter with you to your follow up appointment.

## 2011-05-10 NOTE — Progress Notes (Signed)
Subjective:   Patient ID: Patrick [name] male   DOB: 05/18/1946 65 y.o.   MRN: [id number]  HPI: Patrick [name] is a 65 y.o. man who presents to clinic today for follow up of his chronic medical problems including HTN, HLD, Diabetes, and CKD stage 2.  He states that he has been doing good and taking his medications as prescribed.   We have been working to get him a CPAP since January and [name] is scheduled to come out next week to deliver and set up the CPAP.    He has been recovering slowly from the stroke he suffered in April.  He states that his wife says that he is still forgetful and slow to respond.  He is a poor historian in clinic today as always.    He states that he is taking his insulins as prescribed as well as his Glipizide.  He is not a candidate for Metformin because of his renal function.  He states that he has been forgetting to check his blood sugars at home and did not bring his meter with him today.  He denies polyuria, polydipsia, nausea, vomiting, or abdominal pain.   His Cr that was checked last in August was 2.11 which was still elevated from his previous baseline of 1.7.  He states that he has had some swelling in his legs but it is about the same as it has been for sometime.  His records state that he is followed by [facility] but I don't a record of who his nephrologist is.    Past Medical History  Diagnosis Date  . Diabetes mellitus type II     Insulin dependent  . Hypertension   . Sleep apnea 07/2010  . CKD (chronic kidney disease), stage II   . Paroxysmal atrial fibrillation     on coumadin  . CAD (coronary artery disease)   . Depression   . Gout   . Hyperlipidemia   . 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 aspirin 81 MG EC tablet Take 81 mg by mouth daily.        . benazepril (LOTENSIN) 40 MG tablet Take 2  tablets (80 mg total) by mouth daily.  180 tablet  4  . Blood Glucose Monitoring Suppl (ACCU-CHEK AVIVA PLUS) W/DEVICE KIT 1 each by Does not apply route as needed.  1 kit  0  . cloNIDine (CATAPRES) 0.3 MG tablet TAKE 1 TABLET TWICE DAILY  60 tablet  4  . diltiazem (CARDIZEM) 90 MG tablet Take 1 tablet (90 mg total) by mouth 2 (two) times daily.  180 tablet  4  . famotidine (PEPCID) 20 MG tablet TAKE TWO TABLETS BY MOUTH EVERY DAY  60 tablet  6  . glipiZIDE (GLUCOTROL) 10 MG tablet Take 1.5 tablets (15 mg total) by mouth 2 (two) times daily.  135 tablet  4  . glucose blood (TRUETRACK TEST) test strip Use to test blood sugar 2-3 times daily  100 each  11  . insulin NPH (NOVOLIN N) 100 UNIT/ML injection Inject 25 Units into the skin at bedtime.  10 mL  6  . Insulin Syringe-Needle U-100 (ELITE-THIN INS SYR .5CC/31G) 31G X 5/16" 0.5 ML MISC 1 each by Does not apply route 3 (three) times daily. Use to inject insulin daily  100 each  11  . Lancets 30G MISC 1  each by Does not apply route 3 (three) times daily. Use to test blood glucose 2-3 times daily  100 each  11  . Omega-3 Fatty Acids (FISH OIL) 1000 MG CAPS Take 1 capsule by mouth 2 (two) times daily.        . potassium chloride (MICRO-K) 10 MEQ CR capsule Take 1 capsule (10 mEq total) by mouth 2 (two) times daily.  30 capsule  0  . pravastatin (PRAVACHOL) 40 MG tablet Take 2 tablets (80 mg total) by mouth daily.  180 tablet  4  . warfarin (COUMADIN) 5 MG tablet Take 1 tablet (5 mg total) by mouth as directed.  115 tablet  1   No family history on file. History   Social History  . Marital Status: Married    [name]    Number of Children: N/A  . Years of Education: N/A   Social History Main Topics  . Smoking status: Former Smoker    Types: Cigarettes    Quit date: 10/26/1976  . Smokeless tobacco: Never Used  . Alcohol Use: No  . Drug Use: No  . Sexually Active: None   Other Topics Concern  . None   Social History Narrative    . None   Review of Systems: Constitutional: Denies fever, chills, diaphoresis, appetite change and fatigue.  HEENT: Denies photophobia, eye pain, redness, hearing loss, ear pain, congestion, sore throat, rhinorrhea, sneezing, mouth sores, trouble swallowing, neck pain, neck stiffness and tinnitus.   Respiratory: Denies SOB, DOE, cough, chest tightness,  and wheezing.   Cardiovascular: Denies chest pain, palpitations and leg swelling.  Gastrointestinal: Denies nausea, vomiting, abdominal pain, diarrhea, constipation, blood in stool and abdominal distention.  Genitourinary: Denies dysuria, urgency, frequency, hematuria, flank pain and difficulty urinating.  Musculoskeletal: Denies myalgias, back pain, joint swelling, arthralgias and gait problem.  Skin: Denies pallor, rash and wound.  Neurological: Denies dizziness, seizures, syncope, weakness, light-headedness, numbness and headaches.  Hematological: Denies adenopathy. Easy bruising, personal or family bleeding history  Psychiatric/Behavioral: Denies suicidal ideation, mood changes, confusion, nervousness, sleep disturbance and agitation  Objective:  Physical Exam: Filed Vitals:   05/10/11 1545  BP: 149/86  Pulse: 45  Temp: 96.3 F (35.7 C)  TempSrc: Oral  Height: 5' 8.5" (1.74 m)  Weight: 218 lb 14.4 oz (99.292 kg)   Constitutional: Vital signs reviewed.  Patient is a well-developed and well-nourished man in no acute distress and cooperative with exam. Alert and oriented x3.  He's speech is slowed at his baseline. Head: Normocephalic and atraumatic Ear: TM normal bilaterally Mouth: no erythema or exudates, MMM Eyes: PERRL, EOMI, conjunctivae normal, No scleral icterus.  Neck: Supple, Trachea midline normal ROM, No JVD, mass, thyromegaly, or carotid bruit present.  Cardiovascular: bradycardic, irregular rhythm, S1 normal, S2 normal, no MRG, pulses symmetric and intact bilaterally Pulmonary/Chest: CTAB, no wheezes, rales, or  rhonchi Abdominal: Soft. Non-tender, non-distended, bowel sounds are normal, no masses, organomegaly, or guarding present.  GU: no CVA tenderness Musculoskeletal: No joint deformities, erythema, or stiffness, ROM full and no nontender Hematology: no cervical, inginal, or axillary adenopathy.  Neurological: A&O x3, Strength is normal and symmetric bilaterally, cranial nerve II-XII are grossly intact, no focal motor deficit, sensory intact to light touch bilaterally.  Skin: Warm, dry and intact. No rash, cyanosis, or clubbing.  Psychiatric: Normal mood and flat affect. Speech is slowed and behavior is normal. Judgment and thought content normal. Insight into his conditions is poor. Cognition and memory are normal.  Assessment & Plan:

## 2011-05-16 NOTE — Assessment & Plan Note (Signed)
Lab Results  Component Value Date   HGBA1C 8.8 03/07/2011   HGBA1C  Value: 11.8 11/22/2010   HGBA1C 11.0 10/27/2010   Lab Results  Component Value Date   MICROALBUR 37.60* 10/27/2010   LDLCALC  Value: 73         11/23/2010   CREATININE 1.98* 05/10/2011   His A1C is much better after his stroke and that is likely secondary to his wife taking over his medications and better compliance.  CBG today is well controlled.  He will be due for a repeat A1C in November.

## 2011-05-16 NOTE — Assessment & Plan Note (Signed)
Lab Results  Component Value Date   NA 140 05/10/2011   K 3.8 05/10/2011   CL 104 05/10/2011   CO2 28 05/10/2011   BUN 30* 05/10/2011   CREATININE 1.98* 05/10/2011   CREATININE 2.31* 11/30/2010    BP Readings from Last 3 Encounters:  05/10/11 149/86  04/09/11 140/72  03/07/11 161/92    Assessment: Hypertension control:  mildly elevated  Progress toward goals:  unchanged Barriers to meeting goals:  nonadherence to medications and lack of understanding of disease management  Plan: Hypertension treatment:  continue current medications With the addition of his CPAP we will continue to follow up his blood pressure.  I encouraged him to continue taking his medication.

## 2011-05-16 NOTE — Assessment & Plan Note (Signed)
His CKD is now a stage III and possibly worsening because of his continued hypertension and poorly controlled DM.  His record states he is followed at [facility] but I have no record of who is nephrologist is.  We will discuss this at his follow up.    Basic Metabolic Panel:    Component Value Date/Time   NA 140 05/10/2011 1607   K 3.8 05/10/2011 1607   CL 104 05/10/2011 1607   CO2 28 05/10/2011 1607   BUN 30* 05/10/2011 1607   CREATININE 1.98* 05/10/2011 1607   CREATININE 2.31* 11/30/2010 1130   GLUCOSE 177* 05/10/2011 1607   CALCIUM 9.4 05/10/2011 1607   Creatinine is stable so we will continue to monitor.  He remains on Benazepril because of his resistant hypertension that is likely due to non-compliance.

## 2011-05-16 NOTE — Assessment & Plan Note (Signed)
Lab Results  Component Value Date   CHOL  Value: 146        ATP III CLASSIFICATION:  <200     mg/dL   Desirable  200-239  mg/dL   Borderline High  >=240    mg/dL   High        11/23/2010   CHOL 153 10/27/2010   CHOL 137 04/22/2009   Lab Results  Component Value Date   HDL 46 11/23/2010   HDL 34* 10/27/2010   HDL 38* 04/22/2009   Lab Results  Component Value Date   LDLCALC  Value: 73        Total Cholesterol/HDL:CHD Risk Coronary Heart Disease Risk Table                     Men   Women  1/2 Average Risk   3.4   3.3  Average Risk       5.0   4.4  2 X Average Risk   9.6   7.1  3 X Average Risk  23.4   11.0        Use the calculated Patient Ratio above and the CHD Risk Table to determine the patient's CHD Risk.        ATP III CLASSIFICATION (LDL):  <100     mg/dL   Optimal  100-129  mg/dL   Near or Above                    Optimal  130-159  mg/dL   Borderline  160-189  mg/dL   High  >190     mg/dL   Very High 11/23/2010   LDLCALC 87 10/27/2010   LDLCALC 62 04/22/2009   Lab Results  Component Value Date   TRIG 135 11/23/2010   TRIG 162* 10/27/2010   TRIG 185* 04/22/2009   Lab Results  Component Value Date   CHOLHDL 3.2 11/23/2010   CHOLHDL 4.5 10/27/2010   CHOLHDL 3.6 Ratio 04/22/2009   No results found for this basename: LDLDIRECT   His LDL is below his goal of <100.  He may be a candidate for goal of <70 with his stroke now.  It would necessitate a switch to a stronger statin such as Lipitor or Crestor.

## 2011-05-16 NOTE — Assessment & Plan Note (Signed)
He is FINALLY getting his CPAP next week.  I encouraged him to use it every night to help him sleep.  We will follow up on how it is going at his follow up appointment.

## 2011-06-04 ENCOUNTER — Ambulatory Visit (INDEPENDENT_AMBULATORY_CARE_PROVIDER_SITE_OTHER): Payer: Medicare Other | Admitting: Pharmacist

## 2011-06-04 DIAGNOSIS — Z7901 Long term (current) use of anticoagulants: Secondary | ICD-10-CM

## 2011-06-04 DIAGNOSIS — I4891 Unspecified atrial fibrillation: Secondary | ICD-10-CM

## 2011-06-04 NOTE — Patient Instructions (Signed)
Patient instructed to take medications as defined in the Anti-coagulation Track section of this encounter.  Patient instructed to take today's dose.  Patient verbalized understanding of these instructions.    

## 2011-06-04 NOTE — Progress Notes (Signed)
Anti-Coagulation Progress Note  Earnie Stall is a 65 y.o. male who is currently on an anti-coagulation regimen.    RECENT RESULTS: Recent results are below, the most recent result is correlated with a dose of 35 mg. per week: Lab Results  Component Value Date   INR 2.30 06/04/2011   INR 2.90 05/07/2011   INR 3.60 04/09/2011    ANTI-COAG DOSE:   Latest dosing instructions   Total Sun Mon Tue Wed Thu Fri Sat   35 5 mg 5 mg 5 mg 5 mg 5 mg 5 mg 5 mg    (5 mg1) (5 mg1) (5 mg1) (5 mg1) (5 mg1) (5 mg1) (5 mg1)         ANTICOAG SUMMARY: Anticoagulation Episode Summary              Current INR goal 2.0-3.0 Next INR check 07/09/2011   INR from last check 2.30 (06/04/2011)     Weekly max dose (mg)  Target end date Indefinite   Indications ATRIAL FIBRILLATION, PAROXYSMAL, CHRONIC, Long term current use of anticoagulant   INR check location  Preferred lab    Send INR reminders to [facility] IMP   Comments        Provider Role Specialty Phone number   Larey Dresser  Internal Medicine [phone]        ANTICOAG TODAY: Anticoagulation Summary as of 06/04/2011              INR goal 2.0-3.0     Selected INR 2.30 (06/04/2011) Next INR check 07/09/2011   Weekly max dose (mg)  Target end date Indefinite   Indications ATRIAL FIBRILLATION, PAROXYSMAL, CHRONIC, Long term current use of anticoagulant    Anticoagulation Episode Summary              INR check location  Preferred lab    Send INR reminders to [facility]   Comments        Provider Role Specialty Phone number   Larey Dresser  Internal Medicine [phone]        PATIENT INSTRUCTIONS: Patient Instructions  Patient instructed to take medications as defined in the Anti-coagulation Track section of this encounter.  Patient instructed to take today's dose.  Patient verbalized understanding of these instructions.        FOLLOW-UP Return in 5 weeks (on 07/09/2011) for Follow up INR.  [name],  III Pharm.D., CACP

## 2011-06-28 ENCOUNTER — Other Ambulatory Visit: Payer: Self-pay | Admitting: Internal Medicine

## 2011-07-05 ENCOUNTER — Encounter: Payer: Self-pay | Admitting: Internal Medicine

## 2011-07-05 ENCOUNTER — Other Ambulatory Visit: Payer: Self-pay | Admitting: *Deleted

## 2011-07-05 ENCOUNTER — Ambulatory Visit (INDEPENDENT_AMBULATORY_CARE_PROVIDER_SITE_OTHER): Payer: Medicare Other | Admitting: Internal Medicine

## 2011-07-05 VITALS — BP 176/86 | HR 42 | Temp 97.0°F | Wt 221.0 lb

## 2011-07-05 DIAGNOSIS — Z23 Encounter for immunization: Secondary | ICD-10-CM

## 2011-07-05 DIAGNOSIS — I10 Essential (primary) hypertension: Secondary | ICD-10-CM

## 2011-07-05 DIAGNOSIS — E119 Type 2 diabetes mellitus without complications: Secondary | ICD-10-CM

## 2011-07-05 DIAGNOSIS — G4730 Sleep apnea, unspecified: Secondary | ICD-10-CM

## 2011-07-05 LAB — POCT GLYCOSYLATED HEMOGLOBIN (HGB A1C): Hemoglobin A1C: 8.9

## 2011-07-05 LAB — GLUCOSE, CAPILLARY: Glucose-Capillary: 185 mg/dL — ABNORMAL HIGH (ref 70–99)

## 2011-07-05 MED ORDER — DILTIAZEM HCL 60 MG PO TABS: 60.0000 mg | ORAL_TABLET | Freq: Two times a day (BID) | ORAL | Status: DC

## 2011-07-05 MED ORDER — FAMOTIDINE 20 MG PO TABS: 20.0000 mg | ORAL_TABLET | Freq: Two times a day (BID) | ORAL | Status: DC | PRN

## 2011-07-05 NOTE — Progress Notes (Addendum)
Subjective:   Patient ID: Patrick [name] male   DOB: March 03, 1946 65 y.o.   MRN: [id number]  HPI: Patrick [name] is a 65 y.o. man who presents to clinic today for follow up of his chronic medical conditions.  He states that he has had mild headaches for the last week.  The headaches are in the front of his head and go to the top and then down to the back of his head.  The pain is a throbbing ache that changes when he changes positions. He denies blurry vision, nausea, vomiting, chest pain, or abdominal pain.    He states that he didn't take his blood pressure medication this morning.  He states that he usually takes most of his medication at 6 pm.  When asked how he takes his twice daily medication he states that he takes it at 6 pm and 11 pm.  In reviewing his medications that he brought with him today he has all of his medications but all of his blood pressure medications and his glipizide were filled on 05/26/11 and have several pills in them still.    He has FINALLY gotten his CPAP machine.  He states he is using it every night and that he feels much less sleepy during the day.  He usually goes to bed about 11 pm and is up at 7 am and feels rested when he wakes up.    Past Medical History  Diagnosis Date  . Diabetes mellitus type II     Insulin dependent  . Hypertension   . Sleep apnea 07/2010  . CKD (chronic kidney disease), stage II   . Paroxysmal atrial fibrillation     on coumadin  . CAD (coronary artery disease)   . Depression   . Gout   . Hyperlipidemia   . 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 aspirin 81 MG EC tablet Take 81 mg by mouth daily.        . benazepril (LOTENSIN) 40 MG tablet Take 2 tablets (80 mg total) by mouth daily.  180 tablet  4  . Blood Glucose Monitoring Suppl (ACCU-CHEK AVIVA PLUS) W/DEVICE KIT 1 each by Does not apply route as needed.  1 kit   0  . cloNIDine (CATAPRES) 0.3 MG tablet TAKE 1 TABLET TWICE DAILY  60 tablet  3  . diltiazem (CARDIZEM) 90 MG tablet Take 1 tablet (90 mg total) by mouth 2 (two) times daily.  180 tablet  4  . famotidine (PEPCID) 20 MG tablet Take 1 tablet (20 mg total) by mouth 2 (two) times daily as needed for heartburn.  60 tablet  6  . glipiZIDE (GLUCOTROL) 10 MG tablet Take 1.5 tablets (15 mg total) by mouth 2 (two) times daily.  135 tablet  4  . glucose blood (TRUETRACK TEST) test strip Use to test blood sugar 2-3 times daily  100 each  11  . insulin NPH (NOVOLIN N) 100 UNIT/ML injection Inject 25 Units into the skin at bedtime.  10 mL  6  . Insulin Syringe-Needle U-100 (ELITE-THIN INS SYR .5CC/31G) 31G X 5/16" 0.5 ML MISC 1 each by Does not apply route 3 (three) times daily. Use to inject insulin daily  100 each  11  . Lancets 30G MISC 1 each by Does not apply route 3 (three) times daily. Use to test blood glucose 2-3 times daily  100 each  11  . Omega-3 Fatty Acids (FISH OIL) 1000 MG CAPS Take 1 capsule by mouth 2 (two) times daily.        . potassium chloride (MICRO-K) 10 MEQ CR capsule Take 1 capsule (10 mEq total) by mouth 2 (two) times daily.  30 capsule  0  . pravastatin (PRAVACHOL) 40 MG tablet Take 2 tablets (80 mg total) by mouth daily.  180 tablet  4  . pravastatin (PRAVACHOL) 40 MG tablet TAKE TWO TABLETS BY MOUTH (80 MG TOTAL) EVERY DAY  60 tablet  6  . warfarin (COUMADIN) 5 MG tablet Take 1 tablet (5 mg total) by mouth as directed.  115 tablet  1   No family history on file. History   Social History  . Marital Status: Married    [name]    Number of Children: N/A  . Years of Education: N/A   Social History Main Topics  . Smoking status: Former Smoker    Types: Cigarettes    Quit date: 10/26/1976  . Smokeless tobacco: Never Used  . Alcohol Use: No  . Drug Use: No  . Sexually Active: None   Other Topics Concern  . None   Social History Narrative  . None   Review of  Systems: Constitutional: Denies fever, chills, diaphoresis, appetite change and fatigue.  HEENT: Positive for headaches.  Denies photophobia, eye pain, redness, hearing loss, ear pain, congestion, sore throat, rhinorrhea, sneezing, mouth sores, trouble swallowing, neck pain, neck stiffness and tinnitus.   Respiratory: Denies SOB, DOE, cough, chest tightness,  and wheezing.   Cardiovascular: Denies chest pain, palpitations and leg swelling.  Gastrointestinal: Denies nausea, vomiting, abdominal pain, diarrhea, constipation, blood in stool and abdominal distention.  Genitourinary: Denies dysuria, urgency, frequency, hematuria, flank pain and difficulty urinating.  Musculoskeletal: Denies myalgias, back pain, joint swelling, arthralgias and gait problem.  Skin: Denies pallor, rash and wound.  Neurological: Denies dizziness, seizures, syncope, weakness, light-headedness, numbness and headaches.  Hematological: Denies adenopathy. Easy bruising, personal or family bleeding history  Psychiatric/Behavioral: Denies suicidal ideation, mood changes, confusion, nervousness, sleep disturbance and agitation  Objective:  Physical Exam: Filed Vitals:   07/05/11 1110  BP: 199/96  Pulse: 46  Temp: 97 F (36.1 C)  TempSrc: Oral  Weight: 221 lb (100.245 kg)   Constitutional: Vital signs reviewed.  Patient is a well-developed and well-nourished man in no acute distress and cooperative with exam. Alert and oriented x3.  He is slow to speak and has a hard time remembering things which is baseline since his stroke.  Head: Normocephalic and atraumatic Ear: TM normal bilaterally Mouth: no erythema or exudates, MMM Eyes: PERRL, EOMI, conjunctivae normal, No scleral icterus. no optical disc edema. Neck: Supple, Trachea midline normal ROM, No JVD, mass, thyromegaly, or carotid bruit present.  Cardiovascular: Bradycardic, S1 normal, S2 normal, no MRG, pulses symmetric and intact bilaterally Pulmonary/Chest: CTAB, no  wheezes, rales, or rhonchi Abdominal: Soft. Non-tender, non-distended, bowel sounds are normal, no masses, organomegaly, or guarding present.  GU: no CVA tenderness Musculoskeletal: No joint deformities, erythema, or stiffness, ROM full and no nontender Hematology: no cervical, inginal, or axillary adenopathy.  Neurological: A&O x3, Strength is normal and symmetric bilaterally, cranial nerve II-XII are grossly intact, no focal motor deficit, sensory intact to light touch bilaterally.  Skin: Warm, dry and intact. No rash, cyanosis, or clubbing.  Psychiatric: Normal mood and affect. speech and behavior is normal. Judgment and thought content normal. Cognition and memory are normal.  Assessment & Plan:

## 2011-07-05 NOTE — Patient Instructions (Signed)
1.  Change the Diltiazem to 60 mg tablets take 1 tablet twice daily.  2.  When you have to take a medication twice daily its best to space it out by about 12 hours.  You can take them when you wake up in the morning and before you go to bed.  3.  Follow up in about 2 weeks to recheck your blood pressure with the medication change.  Please bring your diabetes meter with you to that visit.

## 2011-07-09 ENCOUNTER — Ambulatory Visit (INDEPENDENT_AMBULATORY_CARE_PROVIDER_SITE_OTHER): Payer: Medicare Other | Admitting: Pharmacist

## 2011-07-09 DIAGNOSIS — I4891 Unspecified atrial fibrillation: Secondary | ICD-10-CM

## 2011-07-09 DIAGNOSIS — Z7901 Long term (current) use of anticoagulants: Secondary | ICD-10-CM

## 2011-07-09 LAB — POCT INR: INR: 3.4

## 2011-07-09 NOTE — Patient Instructions (Signed)
Patient instructed to take medications as defined in the Anti-coagulation Track section of this encounter.  Patient instructed to take today's dose.  Patient verbalized understanding of these instructions.    

## 2011-07-09 NOTE — Progress Notes (Signed)
Anti-Coagulation Progress Note  Patrick [name] is a 66 y.o. male who is currently on an anti-coagulation regimen.    RECENT RESULTS: Recent results are below, the most recent result is correlated with a dose of 35 mg. per week: Lab Results  Component Value Date   INR 3.40 07/09/2011   INR 2.30 06/04/2011   INR 2.90 05/07/2011    ANTI-COAG DOSE:   Latest dosing instructions   Total Sun Mon Tue Wed Thu Fri Sat   32.5 5 mg 5 mg 5 mg 2.5 mg 5 mg 5 mg 5 mg    (5 mg1) (5 mg1) (5 mg1) (5 mg0.5) (5 mg1) (5 mg1) (5 mg1)         ANTICOAG SUMMARY: Anticoagulation Episode Summary              Current INR goal 2.0-3.0 Next INR check 08/06/2011   INR from last check 3.40! (07/09/2011)     Weekly max dose (mg)  Target end date Indefinite   Indications ATRIAL FIBRILLATION, PAROXYSMAL, CHRONIC, Long term current use of anticoagulant   INR check location  Preferred lab    Send INR reminders to [facility]   Comments        Provider Role Specialty Phone number   Larey Dresser  Internal Medicine [phone]        ANTICOAG TODAY: Anticoagulation Summary as of 07/09/2011              INR goal 2.0-3.0     Selected INR 3.40! (07/09/2011) Next INR check 08/06/2011   Weekly max dose (mg)  Target end date Indefinite   Indications ATRIAL FIBRILLATION, PAROXYSMAL, CHRONIC, Long term current use of anticoagulant    Anticoagulation Episode Summary              INR check location  Preferred lab    Send INR reminders to [facility]   Comments        Provider Role Specialty Phone number   Larey Dresser  Internal Medicine [phone]        PATIENT INSTRUCTIONS: Patient Instructions  Patient instructed to take medications as defined in the Anti-coagulation Track section of this encounter.  Patient instructed to take today's dose.  Patient verbalized understanding of these instructions.        FOLLOW-UP Return in 4 weeks (on 08/06/2011) for Follow up INR.  [name],  III Pharm.D., CACP

## 2011-07-09 NOTE — Assessment & Plan Note (Addendum)
Lab Results  Component Value Date   NA 140 05/10/2011   K 3.8 05/10/2011   CL 104 05/10/2011   CO2 28 05/10/2011   BUN 30* 05/10/2011   CREATININE 1.98* 05/10/2011   CREATININE 2.31* 11/30/2010    BP Readings from Last 3 Encounters:  07/05/11 176/86  05/10/11 149/86  04/09/11 140/72    Assessment: Hypertension control:  moderately elevated  Progress toward goals:  deteriorated Barriers to meeting goals:  lack of insurance, nonadherence to medications and lack of understanding of disease management  Plan: Hypertension treatment:  continue current medications He is bradycardic in the 40s likely because of his diltiazem.  He has little chance of compliance with changing to 90 in the AM and 60 in the PM so we will just drop him to 60 BID.  He was advised that he needs to take his medication in the morning and in the evening instead of taking them all in the evening.  He would really do better with once daily pills but has no part D for his medicare so we have to use 4 dollar list medications instead of Diltiazem XR or clonidine patch. He will follow up in 2 weeks to see how his blood pressure is doing.

## 2011-07-09 NOTE — Assessment & Plan Note (Signed)
Lab Results  Component Value Date   HGBA1C 8.9 07/05/2011   HGBA1C 8.8 03/07/2011   HGBA1C  Value: 11.8 (NOTE)                                                                       According to the ADA Clinical Practice Recommendations for 2011, when HbA1c is used as a screening test:   >=6.5%   Diagnostic of Diabetes Mellitus           (if abnormal result  is confirmed)  5.7-6.4%   Increased risk of developing Diabetes Mellitus  References:Diagnosis and Classification of Diabetes Mellitus,Diabetes [id number] 1):S62-S69 and Standards of Medical Care in         Diabetes - 2011,Diabetes [id number]  (Suppl 1):S11-S61.* 11/22/2010   Lab Results  Component Value Date   MICROALBUR 37.60* 10/27/2010   LDLCALC  Value: 73        Total Cholesterol/HDL:CHD Risk Coronary Heart Disease Risk Table                     Men   Women  1/2 Average Risk   3.4   3.3  Average Risk       5.0   4.4  2 X Average Risk   9.6   7.1  3 X Average Risk  23.4   11.0        Use the calculated Patient Ratio above and the CHD Risk Table to determine the patient's CHD Risk.        ATP III CLASSIFICATION (LDL):  <100     mg/dL   Optimal  100-129  mg/dL   Near or Above                    Optimal  130-159  mg/dL   Borderline  160-189  mg/dL   High  >190     mg/dL   Very High 11/23/2010   CREATININE 1.98* 05/10/2011   His A1C is mildly increased.  After reviewing the medications he brought today I noticed that he has missed several doses of the glipizide which is likely the cause of his increased A1C.  He was counseled that he needs to take his medication every day and that if he needs help remembering that he needs to ask his wife for help remembering or consider an alarm to help him take his medications on time.

## 2011-07-10 NOTE — Assessment & Plan Note (Signed)
He states he is using his CPAP and that he is less sleepy during the day.  His blood pressure hasn't really responded yet but that will likely take time and he also needs to take his medications as intended.  We will continue to monitor.

## 2011-07-19 ENCOUNTER — Ambulatory Visit (INDEPENDENT_AMBULATORY_CARE_PROVIDER_SITE_OTHER): Payer: Medicare Other | Admitting: Internal Medicine

## 2011-07-19 VITALS — BP 173/111 | HR 70 | Temp 97.0°F | Wt 210.8 lb

## 2011-07-19 DIAGNOSIS — I10 Essential (primary) hypertension: Secondary | ICD-10-CM

## 2011-07-19 MED ORDER — CHLORTHALIDONE 25 MG PO TABS: 25.0000 mg | ORAL_TABLET | Freq: Every day | ORAL | Status: DC

## 2011-07-19 NOTE — Assessment & Plan Note (Signed)
Patient's blood pressures elevated despite being on optimum dose of benazepril, clonidine and diltiazem, given the patient's high risk of stroke will add another blood pressure agent, chlorthalidone, and recheck blood pressure in one week along with basic metabolic panel to ensure he does not have acute kidney injury, especially given that he has chronic kidney disease. Note that his blood pressure remains elevated patient would benefit from hydralazine/isosorbide mononitrate combination given that she is African American.

## 2011-07-19 NOTE — Patient Instructions (Signed)
Please take your new medication chlorthalidone

## 2011-07-19 NOTE — Progress Notes (Signed)
Subjective:     Patient ID: Patrick [name], male   DOB: Dec 03, 1945, 65 y.o.   MRN: [id number]  HPI  Patient is a 65 year old male with a PMH listed below, presents to the [facility] for BP followup, today his BP is elevated despite compliance with his medications. Pt denies any complaints.  Patient Active Problem List  Diagnoses  . DIABETES MELLITUS, TYPE II  . HYPERLIPIDEMIA  . GOUT  . MORBID OBESITY  . DEPRESSION  . HYPERTENSION  . CORONARY ARTERY DISEASE  . ATRIAL FIBRILLATION, PAROXYSMAL, CHRONIC  . Chronic kidney disease (CKD), stage III (moderate)  . SKIN LESION  . SLEEP APNEA  . PERIPHERAL EDEMA  . Long term current use of anticoagulant  . Acute ischemic VBA thalamic stroke   Current Outpatient Prescriptions on File Prior to Visit  Medication Sig Dispense Refill  . aspirin 81 MG EC tablet Take 81 mg by mouth daily.        . benazepril (LOTENSIN) 40 MG tablet Take 2 tablets (80 mg total) by mouth daily.  180 tablet  4  . Blood Glucose Monitoring Suppl (ACCU-CHEK AVIVA PLUS) W/DEVICE KIT 1 each by Does not apply route as needed.  1 kit  0  . cloNIDine (CATAPRES) 0.3 MG tablet TAKE 1 TABLET TWICE DAILY  60 tablet  3  . diltiazem (CARDIZEM) 60 MG tablet Take 1 tablet (60 mg total) by mouth 2 (two) times daily.  180 tablet  4  . famotidine (PEPCID) 20 MG tablet Take 1 tablet (20 mg total) by mouth 2 (two) times daily as needed for heartburn.  60 tablet  6  . glipiZIDE (GLUCOTROL) 10 MG tablet Take 1.5 tablets (15 mg total) by mouth 2 (two) times daily.  135 tablet  4  . glucose blood (TRUETRACK TEST) test strip Use to test blood sugar 2-3 times daily  100 each  11  . insulin NPH (NOVOLIN N) 100 UNIT/ML injection Inject 25 Units into the skin at bedtime.  10 mL  6  . Insulin Syringe-Needle U-100 (ELITE-THIN INS SYR .5CC/31G) 31G X 5/16" 0.5 ML MISC 1 each by Does not apply route 3 (three) times daily. Use to inject insulin daily  100 each  11  . Lancets 30G MISC 1 each by Does not apply  route 3 (three) times daily. Use to test blood glucose 2-3 times daily  100 each  11  . Omega-3 Fatty Acids (FISH OIL) 1000 MG CAPS Take 1 capsule by mouth 2 (two) times daily.        . potassium chloride (MICRO-K) 10 MEQ CR capsule Take 1 capsule (10 mEq total) by mouth 2 (two) times daily.  30 capsule  0  . pravastatin (PRAVACHOL) 40 MG tablet TAKE TWO TABLETS BY MOUTH (80 MG TOTAL) EVERY DAY  60 tablet  6  . warfarin (COUMADIN) 5 MG tablet Take 1 tablet (5 mg total) by mouth as directed.  115 tablet  1   No Known Allergies   Review of Systems  All other systems reviewed and are negative.       Objective:   Physical Exam  Nursing note and vitals reviewed. Constitutional: He is oriented to person, place, and time. He appears well-developed and well-nourished.  HENT:  Head: Normocephalic and atraumatic.  Eyes: Pupils are equal, round, and reactive to light.  Neck: Normal range of motion. No JVD present. No thyromegaly present.  Cardiovascular: Normal rate, regular rhythm and normal heart sounds.   Pulmonary/Chest:  Effort normal and breath sounds normal. He has no wheezes. He has no rales.  Abdominal: Soft. Bowel sounds are normal. There is no tenderness. There is no rebound.  Musculoskeletal: Normal range of motion. He exhibits no edema.  Neurological: He is alert and oriented to person, place, and time.  Skin: Skin is warm and dry.

## 2011-07-26 ENCOUNTER — Ambulatory Visit (INDEPENDENT_AMBULATORY_CARE_PROVIDER_SITE_OTHER): Payer: Medicare Other | Admitting: Internal Medicine

## 2011-07-26 ENCOUNTER — Encounter: Payer: Self-pay | Admitting: Internal Medicine

## 2011-07-26 VITALS — BP 180/102 | HR 75 | Temp 96.7°F | Ht 68.5 in | Wt 212.0 lb

## 2011-07-26 DIAGNOSIS — R319 Hematuria, unspecified: Secondary | ICD-10-CM

## 2011-07-26 DIAGNOSIS — I10 Essential (primary) hypertension: Secondary | ICD-10-CM

## 2011-07-26 DIAGNOSIS — E119 Type 2 diabetes mellitus without complications: Secondary | ICD-10-CM

## 2011-07-26 LAB — BASIC METABOLIC PANEL
Calcium: 9.2 mg/dL (ref 8.4–10.5)
Glucose, Bld: 349 mg/dL — ABNORMAL HIGH (ref 70–99)
Potassium: 3.6 mEq/L (ref 3.5–5.3)
Sodium: 139 mEq/L (ref 135–145)

## 2011-07-26 LAB — GLUCOSE, CAPILLARY: Glucose-Capillary: 313 mg/dL — ABNORMAL HIGH (ref 70–99)

## 2011-07-26 MED ORDER — GLIPIZIDE 10 MG PO TABS: 15.0000 mg | ORAL_TABLET | Freq: Two times a day (BID) | ORAL | Status: DC

## 2011-07-26 NOTE — Progress Notes (Signed)
Patient ID: Patrick [name], male   DOB: 18-Mar-1946, 65 y.o.   MRN: [id number] [name] comes in today for BP recheck. No new complaints. Mild swelling in his feet. Was started on Chlorthalidone at his last visit for diuretic, does not think this has helped him or made him urinate more frequently. His BP remains high today-essentially unchanged. His wife vouches for 100% medication compliance.   Exam: Trace LE edema, RRR, fixed crackles in bases bilaterally, slow affect but does laugh at light humor. Wife expresses concern about his memory, problems started after the stroke.   See problem based charting for additional details.

## 2011-07-26 NOTE — Assessment & Plan Note (Signed)
I reviewed Patrick [name] BP work up in the setting of refractory HTN. I discussed the difficulty of finding the right medication combination given his financial limitations and underlying cause for his HTN. He has not had a 2D echo since 2007 and I am strongly suspicious for diastolic dysfunction as a contributor to his BP, edema and renal dysfunction. We need to repeat this today- he may respond very well to more aggressive diuresis as well as a vasodilator like Imdur/hydralazine. Before adding on any additional medication I would like to review the 2D Echo and check a BMET. If echo shows diastolic dysfunction his renal function may improve with diuresis and he may benefit from a vasodilator like Imdur or Hydralazine.

## 2011-07-26 NOTE — Patient Instructions (Signed)
We will check blood work and a heart test and then decide on a blood pressure medication. Continue your current medication for now. Will have our social worker call you about medicare prescription drug benefit options/resources.

## 2011-07-26 NOTE — Progress Notes (Signed)
Pt and wife aware of appt [facility] 08/21/11 2PM - 2D Echo. Hilda Blades Lamarion Mcevers RN 07/26/11 3PM

## 2011-08-04 ENCOUNTER — Other Ambulatory Visit: Payer: Self-pay | Admitting: Internal Medicine

## 2011-08-06 ENCOUNTER — Ambulatory Visit (INDEPENDENT_AMBULATORY_CARE_PROVIDER_SITE_OTHER): Payer: Medicare Other | Admitting: Pharmacist

## 2011-08-06 DIAGNOSIS — I4891 Unspecified atrial fibrillation: Secondary | ICD-10-CM

## 2011-08-06 DIAGNOSIS — Z7901 Long term (current) use of anticoagulants: Secondary | ICD-10-CM

## 2011-08-06 LAB — POCT INR: INR: 2.9

## 2011-08-06 MED ORDER — WARFARIN SODIUM 5 MG PO TABS: 5.0000 mg | ORAL_TABLET | ORAL | Status: DC

## 2011-08-06 NOTE — Patient Instructions (Signed)
Patient instructed to take medications as defined in the Anti-coagulation Track section of this encounter.  Patient instructed to take today's dose.  Patient verbalized understanding of these instructions.    

## 2011-08-06 NOTE — Progress Notes (Signed)
Anti-Coagulation Progress Note  Patrick [name] is a 66 y.o. male who is currently on an anti-coagulation regimen.    RECENT RESULTS: Recent results are below, the most recent result is correlated with a dose of 32.5 mg. per week: Lab Results  Component Value Date   INR 2.90 08/06/2011   INR 3.40 07/09/2011   INR 2.30 06/04/2011    ANTI-COAG DOSE:   Latest dosing instructions   Total Sun Mon Tue Wed Thu Fri Sat   32.5 5 mg 5 mg 5 mg 2.5 mg 5 mg 5 mg 5 mg    (5 mg1) (5 mg1) (5 mg1) (5 mg0.5) (5 mg1) (5 mg1) (5 mg1)         ANTICOAG SUMMARY: Anticoagulation Episode Summary              Current INR goal 2.0-3.0 Next INR check 09/03/2011   INR from last check 2.90 (08/06/2011)     Weekly max dose (mg)  Target end date Indefinite   Indications ATRIAL FIBRILLATION, PAROXYSMAL, CHRONIC, Long term current use of anticoagulant   INR check location  Preferred lab    Send INR reminders to [facility]   Comments        Provider Role Specialty Phone number   Larey Dresser  Internal Medicine [phone]        ANTICOAG TODAY: Anticoagulation Summary as of 08/06/2011              INR goal 2.0-3.0     Selected INR 2.90 (08/06/2011) Next INR check 09/03/2011   Weekly max dose (mg)  Target end date Indefinite   Indications ATRIAL FIBRILLATION, PAROXYSMAL, CHRONIC, Long term current use of anticoagulant    Anticoagulation Episode Summary              INR check location  Preferred lab    Send INR reminders to [facility]   Comments        Provider Role Specialty Phone number   Larey Dresser  Internal Medicine [phone]        PATIENT INSTRUCTIONS: Patient Instructions  Patient instructed to take medications as defined in the Anti-coagulation Track section of this encounter.  Patient instructed to take today's dose.  Patient verbalized understanding of these instructions.        FOLLOW-UP Return in 4 weeks (on 09/03/2011) for Follow up INR.  [name],  III Pharm.D., CACP

## 2011-08-21 ENCOUNTER — Ambulatory Visit (HOSPITAL_COMMUNITY): Payer: Medicare Other | Attending: Internal Medicine

## 2011-08-24 ENCOUNTER — Encounter: Payer: Medicare Other | Admitting: Internal Medicine

## 2011-08-31 ENCOUNTER — Encounter: Payer: Self-pay | Admitting: Internal Medicine

## 2011-08-31 ENCOUNTER — Ambulatory Visit (INDEPENDENT_AMBULATORY_CARE_PROVIDER_SITE_OTHER): Payer: Medicare Other | Admitting: Pharmacist

## 2011-08-31 ENCOUNTER — Ambulatory Visit (INDEPENDENT_AMBULATORY_CARE_PROVIDER_SITE_OTHER): Payer: Medicare Other | Admitting: Internal Medicine

## 2011-08-31 VITALS — BP 138/87 | HR 62 | Temp 96.3°F | Ht 69.5 in | Wt 211.7 lb

## 2011-08-31 DIAGNOSIS — E119 Type 2 diabetes mellitus without complications: Secondary | ICD-10-CM

## 2011-08-31 DIAGNOSIS — Z7901 Long term (current) use of anticoagulants: Secondary | ICD-10-CM | POA: Diagnosis not present

## 2011-08-31 DIAGNOSIS — I4891 Unspecified atrial fibrillation: Secondary | ICD-10-CM | POA: Diagnosis not present

## 2011-08-31 DIAGNOSIS — G4730 Sleep apnea, unspecified: Secondary | ICD-10-CM

## 2011-08-31 DIAGNOSIS — I10 Essential (primary) hypertension: Secondary | ICD-10-CM | POA: Diagnosis not present

## 2011-08-31 LAB — POCT INR: INR: 2.2

## 2011-08-31 MED ORDER — DILTIAZEM HCL 90 MG PO TABS: 90.0000 mg | ORAL_TABLET | Freq: Two times a day (BID) | ORAL | Status: DC

## 2011-08-31 MED ORDER — CHLORTHALIDONE 25 MG PO TABS: 25.0000 mg | ORAL_TABLET | Freq: Every day | ORAL | Status: DC

## 2011-08-31 NOTE — Patient Instructions (Signed)
1.  Continue your medications as prescribed.  2.  Keep working hard on taking your medicines right and watching your diet.  We are in a good spot right now and now the challenge is to maintain it.  KEEP UP THE GOOD WORK!!  3.  Follow up in the middle of March to recheck your blood sugars.

## 2011-08-31 NOTE — Progress Notes (Signed)
Anti-Coagulation Progress Note  Patrick [name] is a 67 y.o. male who is currently on an anti-coagulation regimen.    RECENT RESULTS: Recent results are below, the most recent result is correlated with a dose of 32.5 mg. per week: Lab Results  Component Value Date   INR 2.20 08/31/2011   INR 2.90 08/06/2011   INR 3.40 07/09/2011    ANTI-COAG DOSE:   Latest dosing instructions   Total Sun Mon Tue Wed Thu Fri Sat   35 5 mg 5 mg 5 mg 5 mg 5 mg 5 mg 5 mg    (5 mg1) (5 mg1) (5 mg1) (5 mg1) (5 mg1) (5 mg1) (5 mg1)         ANTICOAG SUMMARY: Anticoagulation Episode Summary              Current INR goal 2.0-3.0 Next INR check 09/24/2011   INR from last check 2.20 (08/31/2011)     Weekly max dose (mg)  Target end date Indefinite   Indications ATRIAL FIBRILLATION, PAROXYSMAL, CHRONIC, Long term current use of anticoagulant   INR check location  Preferred lab    Send INR reminders to [facility]   Comments        Provider Role Specialty Phone number   Larey Dresser, MD  Internal Medicine [phone]        ANTICOAG TODAY: Anticoagulation Summary as of 08/31/2011              INR goal 2.0-3.0     Selected INR 2.20 (08/31/2011) Next INR check 09/24/2011   Weekly max dose (mg)  Target end date Indefinite   Indications ATRIAL FIBRILLATION, PAROXYSMAL, CHRONIC, Long term current use of anticoagulant    Anticoagulation Episode Summary              INR check location  Preferred lab    Send INR reminders to [facility]   Comments        Provider Role Specialty Phone number   Larey Dresser, MD  Internal Medicine [phone]        PATIENT INSTRUCTIONS: Patient Instructions  Patient instructed to take medications as defined in the Anti-coagulation Track section of this encounter.  Patient instructed to take today's dose.  Patient verbalized understanding of these instructions.        FOLLOW-UP Return in 3 weeks (on 09/24/2011) for Follow up INR.  [name],  III Pharm.D., CACP

## 2011-08-31 NOTE — Patient Instructions (Signed)
Patient instructed to take medications as defined in the Anti-coagulation Track section of this encounter.  Patient instructed to take today's dose.  Patient verbalized understanding of these instructions.    

## 2011-08-31 NOTE — Progress Notes (Signed)
Subjective:   Patient ID: Patrick [name] male   DOB: 1946-04-27 66 y.o.   MRN: [id number]  HPI: Patrick [name] is a 66 y.o. man who presents to clinic today for follow up of his diabetes, hypertenstion, and sleep apnea.  He states that he is taking his medication as prescribed and that he is continuing to do well.  His wife is with him today.    They state that [facility] came out yesterday and went through his medications.  They noted that he has been taking diltiazem 90 mg BID instead of the 60 mg BID that we had put him on last time.  His blood pressure and pulse today are excellent and he has no problems with dizziness on standing or signs of high blood pressure including headache, blurry vision, or chest pain.    He states that he has been taking his insulin and his glipizide as directed.  He denies hypoglycemia and did not bring his meter with him today.  He is due for a HgBA1C in March.  He denies polyuria, polydipsia, or blurry vision.    He states that he has been using his CPAP at night and that he feels like he is much better rested during the day.  His wife states that he still sleeps a lot during the day and has very little energy throughout the day.  She states that he is not snoring during the night anymore with the use of the CPAP and that he wears it almost every night for at least 4 hours per night.   Past Medical History  Diagnosis Date  . Diabetes mellitus type II     Insulin dependent  . Hypertension   . Sleep apnea 07/2010  . CKD (chronic kidney disease), stage II   . Paroxysmal atrial fibrillation     on coumadin  . CAD (coronary artery disease)   . Depression   . Gout   . Hyperlipidemia   . 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 aspirin 81 MG EC tablet Take 81 mg by mouth daily.        . benazepril (LOTENSIN) 40 MG tablet TAKE TWO TABLETS BY MOUTH  EVERY DAY  62 tablet  5  . Blood Glucose Monitoring Suppl (ACCU-CHEK AVIVA PLUS) W/DEVICE KIT 1 each by Does not apply route as needed.  1 kit  0  . chlorthalidone (HYGROTON) 25 MG tablet Take 1 tablet (25 mg total) by mouth daily.  30 tablet  0  . cloNIDine (CATAPRES) 0.3 MG tablet TAKE 1 TABLET TWICE DAILY  60 tablet  3  . diltiazem (CARDIZEM) 60 MG tablet Take 1 tablet (60 mg total) by mouth 2 (two) times daily.  180 tablet  4  . famotidine (PEPCID) 20 MG tablet Take 1 tablet (20 mg total) by mouth 2 (two) times daily as needed for heartburn.  60 tablet  6  . famotidine (PEPCID) 20 MG tablet TAKE TWO TABLETS BY MOUTH EVERY DAY  62 tablet  5  . glipiZIDE (GLUCOTROL) 10 MG tablet Take 1.5 tablets (15 mg total) by mouth 2 (two) times daily before a meal.  180 tablet  3  . glucose blood (TRUETRACK TEST) test strip Use to test blood sugar 2-3 times daily  100 each  11  . insulin NPH (NOVOLIN N) 100 UNIT/ML injection Inject 25 Units into the skin at  bedtime.  10 mL  6  . Insulin Syringe-Needle U-100 (ELITE-THIN INS SYR .5CC/31G) 31G X 5/16" 0.5 ML MISC 1 each by Does not apply route 3 (three) times daily. Use to inject insulin daily  100 each  11  . Lancets 30G MISC 1 each by Does not apply route 3 (three) times daily. Use to test blood glucose 2-3 times daily  100 each  11  . Omega-3 Fatty Acids (FISH OIL) 1000 MG CAPS Take 1 capsule by mouth 2 (two) times daily.        . potassium chloride (MICRO-K) 10 MEQ CR capsule Take 1 capsule (10 mEq total) by mouth 2 (two) times daily.  30 capsule  0  . pravastatin (PRAVACHOL) 40 MG tablet TAKE TWO TABLETS BY MOUTH (80 MG TOTAL) EVERY DAY  60 tablet  6  . warfarin (COUMADIN) 5 MG tablet Take 1 tablet (5 mg total) by mouth as directed.  115 tablet  1   No family history on file. History   Social History  . Marital Status: Married    [name]    Number of Children: N/A  . Years of Education: N/A   Social History Main Topics  . Smoking status:  Former Smoker    Types: Cigarettes    Quit date: 10/26/1976  . Smokeless tobacco: Never Used  . Alcohol Use: No  . Drug Use: No  . Sexually Active: None   Other Topics Concern  . None   Social History Narrative  . None   Review of Systems: Negative except as noted in the HPI.   Objective:  Physical Exam: Filed Vitals:   08/31/11 1458  BP: 138/87  Pulse: 62  Temp: 96.3 F (35.7 C)  TempSrc: Oral  Height: 5' 9.5" (1.765 m)  Weight: 211 lb 11.2 oz (96.026 kg)   Constitutional: Vital signs reviewed.  Patient is a well-developed and well-nourished man in no acute distress and cooperative with exam. Alert and oriented x3.  Head: Normocephalic and atraumatic Ear: TM normal bilaterally Mouth: no erythema or exudates, MMM Eyes: PERRL, EOMI, conjunctivae normal, No scleral icterus.  Neck: Supple, Trachea midline normal ROM, No JVD, mass, thyromegaly, or carotid bruit present.  Cardiovascular: irregular rate and rhythm, S1 normal, S2 normal, no MRG, pulses symmetric and intact bilaterally Pulmonary/Chest: CTAB, no wheezes, rales, or rhonchi Abdominal: Soft. Non-tender, non-distended, bowel sounds are normal, no masses, organomegaly, or guarding present.  GU: no CVA tenderness Musculoskeletal: No joint deformities, erythema, or stiffness, ROM full and no nontender Hematology: no cervical, inginal, or axillary adenopathy.  Neurological: A&O x3, Strength is normal and symmetric bilaterally, cranial nerve II-XII are grossly intact, no focal motor deficit, sensory intact to light touch bilaterally.  Skin: Warm, dry and intact. No rash, cyanosis, or clubbing.  Psychiatric: Normal mood and flat affect. speech is slow but appropriate.  behavior is normal. Judgment and thought content normal. Cognition and memory are normal.   Assessment & Plan:

## 2011-09-03 ENCOUNTER — Ambulatory Visit: Payer: Medicare Other

## 2011-09-24 ENCOUNTER — Ambulatory Visit (INDEPENDENT_AMBULATORY_CARE_PROVIDER_SITE_OTHER): Payer: Medicare Other | Admitting: Pharmacist

## 2011-09-24 DIAGNOSIS — I4891 Unspecified atrial fibrillation: Secondary | ICD-10-CM | POA: Diagnosis not present

## 2011-09-24 DIAGNOSIS — Z7901 Long term (current) use of anticoagulants: Secondary | ICD-10-CM

## 2011-09-24 NOTE — Progress Notes (Signed)
Anti-Coagulation Progress Note  Patrick [name] is a 66 y.o. male who is currently on an anti-coagulation regimen.    RECENT RESULTS: Recent results are below, the most recent result is correlated with a dose of 35 mg. per week: Lab Results  Component Value Date   INR 2.80 09/24/2011   INR 2.20 08/31/2011   INR 2.90 08/06/2011    ANTI-COAG DOSE:   Latest dosing instructions   Total Sun Mon Tue Wed Thu Fri Sat   32.5 5 mg 5 mg 5 mg 2.5 mg 5 mg 5 mg 5 mg    (5 mg1) (5 mg1) (5 mg1) (5 mg0.5) (5 mg1) (5 mg1) (5 mg1)         ANTICOAG SUMMARY: Anticoagulation Episode Summary              Current INR goal 2.0-3.0 Next INR check 10/22/2011   INR from last check 2.80 (09/24/2011)     Weekly max dose (mg)  Target end date Indefinite   Indications ATRIAL FIBRILLATION, PAROXYSMAL, CHRONIC, Long term current use of anticoagulant   INR check location  Preferred lab    Send INR reminders to [facility]   Comments        Provider Role Specialty Phone number   Larey Dresser, MD  Internal Medicine [phone]        ANTICOAG TODAY: Anticoagulation Summary as of 09/24/2011              INR goal 2.0-3.0     Selected INR 2.80 (09/24/2011) Next INR check 10/22/2011   Weekly max dose (mg)  Target end date Indefinite   Indications ATRIAL FIBRILLATION, PAROXYSMAL, CHRONIC, Long term current use of anticoagulant    Anticoagulation Episode Summary              INR check location  Preferred lab    Send INR reminders to [facility]   Comments        Provider Role Specialty Phone number   Larey Dresser, MD  Internal Medicine [phone]        PATIENT INSTRUCTIONS: Patient Instructions  Patient instructed to take medications as defined in the Anti-coagulation Track section of this encounter.  Patient instructed to take today's dose.  Patient verbalized understanding of these instructions.        FOLLOW-UP Return in 4 weeks (on 10/22/2011) for Follow up INR.  [name],  III Pharm.D., CACP

## 2011-09-24 NOTE — Patient Instructions (Signed)
Patient instructed to take medications as defined in the Anti-coagulation Track section of this encounter.  Patient instructed to take today's dose.  Patient verbalized understanding of these instructions.    

## 2011-10-12 ENCOUNTER — Ambulatory Visit (INDEPENDENT_AMBULATORY_CARE_PROVIDER_SITE_OTHER): Payer: Medicare Other | Admitting: Internal Medicine

## 2011-10-12 ENCOUNTER — Encounter: Payer: Self-pay | Admitting: Internal Medicine

## 2011-10-12 VITALS — BP 132/80 | HR 63 | Temp 96.7°F | Ht 70.0 in | Wt 211.7 lb

## 2011-10-12 DIAGNOSIS — E119 Type 2 diabetes mellitus without complications: Secondary | ICD-10-CM

## 2011-10-12 DIAGNOSIS — N183 Chronic kidney disease, stage 3 unspecified: Secondary | ICD-10-CM | POA: Diagnosis not present

## 2011-10-12 DIAGNOSIS — M109 Gout, unspecified: Secondary | ICD-10-CM | POA: Diagnosis not present

## 2011-10-12 DIAGNOSIS — I10 Essential (primary) hypertension: Secondary | ICD-10-CM | POA: Diagnosis not present

## 2011-10-12 DIAGNOSIS — E785 Hyperlipidemia, unspecified: Secondary | ICD-10-CM

## 2011-10-12 NOTE — Patient Instructions (Addendum)
1.  Make sure you are eating a good balanced diet for your diabetes.  You also need to work on exercise now that the weather is nicer.  Diet and exercise will help your blood pressure, cholesterol, blood sugar, and your energy levels.  2.  Make sure you are taking your insulin EVERY day!  Make sure to check your blood sugar twice daily.  Before breakfast, before lunch, before supper, and before bedtime.  Pick two and alternate days.  3.  Continue all of your other medications as prescribed.  4.  Follow up with me in 2 months to see how you are doing.

## 2011-10-12 NOTE — Progress Notes (Signed)
Subjective:   Patient ID: Patrick [name] male   DOB: 01-20-1946 66 y.o.   MRN: [id number]  HPI: Patrick [name] is a 66 y.o. man here for follow up on his chronic medical conditions including diabetes, hypertension, OSA, hyperlipidemia, and CKD stage III.  He states that he is feeling well generally.  His wife is present today.  He states that he is missing a few doses of insulin because of the "bumps" that it causes.  He denies any polyuria, polydipsia, erythema around the bumps or drainage.    He states that he had a flare of gout in his right great toe last week.  He had some left over prednisone 20 mg tablets that he took daily for 3 days which helped.  Currently he is pain free and hasn't noticed any warmth or redness around the toe today.    He states that he has been using his CPAP on average 6 of 7 days of the week.  He still continues to be sleepy during the day and his wife states the he is still lower then usual with his mental processing.    Past Medical History  Diagnosis Date  . Diabetes mellitus type II     Insulin dependent  . Hypertension   . Sleep apnea 07/2010  . CKD (chronic kidney disease), stage II   . Paroxysmal atrial fibrillation     on coumadin  . CAD (coronary artery disease)   . Depression   . Gout   . Hyperlipidemia   . 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 aspirin 81 MG EC tablet Take 81 mg by mouth daily.        . benazepril (LOTENSIN) 40 MG tablet TAKE TWO TABLETS BY MOUTH EVERY DAY  62 tablet  5  . Blood Glucose Monitoring Suppl (ACCU-CHEK AVIVA PLUS) W/DEVICE KIT 1 each by Does not apply route as needed.  1 kit  0  . chlorthalidone (HYGROTON) 25 MG tablet Take 1 tablet (25 mg total) by mouth daily.  90 tablet  4  . cloNIDine (CATAPRES) 0.3 MG tablet TAKE 1 TABLET TWICE DAILY  60 tablet  3  . diltiazem (CARDIZEM) 90 MG tablet Take 1  tablet (90 mg total) by mouth 2 (two) times daily.  180 tablet  4  . glipiZIDE (GLUCOTROL) 10 MG tablet Take 1.5 tablets (15 mg total) by mouth 2 (two) times daily before a meal.  180 tablet  3  . glucose blood (TRUETRACK TEST) test strip Use to test blood sugar 2-3 times daily  100 each  11  . insulin NPH (NOVOLIN N) 100 UNIT/ML injection Inject 25 Units into the skin at bedtime.  10 mL  6  . Insulin Syringe-Needle U-100 (ELITE-THIN INS SYR .5CC/31G) 31G X 5/16" 0.5 ML MISC 1 each by Does not apply route 3 (three) times daily. Use to inject insulin daily  100 each  11  . Lancets 30G MISC 1 each by Does not apply route 3 (three) times daily. Use to test blood glucose 2-3 times daily  100 each  11  . Omega-3 Fatty Acids (FISH OIL) 1000 MG CAPS Take 1 capsule by mouth 2 (two) times daily.        . potassium chloride (MICRO-K) 10 MEQ CR capsule Take 1 capsule (10 mEq total) by mouth 2 (two) times daily.  30 capsule  0  .  pravastatin (PRAVACHOL) 40 MG tablet TAKE TWO TABLETS BY MOUTH (80 MG TOTAL) EVERY DAY  60 tablet  6  . warfarin (COUMADIN) 5 MG tablet Take 1 tablet (5 mg total) by mouth as directed.  115 tablet  1   No family history on file. History   Social History  . Marital Status: Married    [name]    Number of Children: N/A  . Years of Education: N/A   Social History Main Topics  . Smoking status: Former Smoker    Types: Cigarettes    Quit date: 10/26/1976  . Smokeless tobacco: Never Used  . Alcohol Use: No  . Drug Use: No  . Sexually Active: None   Other Topics Concern  . None   Social History Narrative  . None   Review of Systems: Negative except as noted in the HPI.   Objective:  Physical Exam: Filed Vitals:   10/12/11 1409  BP: 152/85  Pulse: 62  Temp: 96.7 F (35.9 C)  TempSrc: Oral  Height: 5\' 10"  (1.778 m)  Weight: 211 lb 11.2 oz (96.026 kg)   Constitutional: Vital signs reviewed.  Patient is a well-developed and well-nourished man in no acute  distress and cooperative with exam. Alert and oriented x3. Very flat affect and slow to answer many questions.  Head: Normocephalic and atraumatic Ear: TM normal bilaterally Mouth: no erythema or exudates, MMM Eyes: PERRL, EOMI, conjunctivae normal, No scleral icterus.  Neck: Supple, Trachea midline normal ROM, No JVD, mass, thyromegaly, or carotid bruit present.  Cardiovascular: bradycardic but regular rhythm, S1 normal, S2 normal, no MRG, pulses symmetric and intact bilaterally Pulmonary/Chest: CTAB, no wheezes, rales, or rhonchi Abdominal: Soft. Non-tender, non-distended, bowel sounds are normal, no masses, organomegaly, or guarding present.  GU: no CVA tenderness Musculoskeletal: Right great toe mildly swollen with no erythema or pain with movement. No joint deformities, erythema, or stiffness, ROM full and no nontender Hematology: no cervical, inginal, or axillary adenopathy.  Neurological: A&O x3, Strength is normal and symmetric bilaterally, cranial nerve II-XII are grossly intact, no focal motor deficit, sensory intact to light touch bilaterally.  Skin: Warm, dry and intact. No rash, cyanosis, or clubbing.  Psychiatric: depressed appearing mood and flat affect. speech is slow but logical and behavior is normal. Judgment and insight is poor.  Thought content normal. Cognition is slowed and memory short term memory appears mildly impaired.   Assessment & Plan:

## 2011-10-17 NOTE — Assessment & Plan Note (Signed)
He is using his CPAP every night and per the compliance report has only had one night in the last month where he wore it for less then 4 hours.  I encouraged him to continue his use and to work on getting more activity which will help his sleepiness during the day.

## 2011-10-17 NOTE — Assessment & Plan Note (Signed)
Lab Results  Component Value Date   NA 139 07/26/2011   K 3.6 07/26/2011   CL 99 07/26/2011   CO2 28 07/26/2011   BUN 39* 07/26/2011   CREATININE 2.48* 07/26/2011   CREATININE 2.31* 11/30/2010    BP Readings from Last 3 Encounters:  08/31/11 138/87  07/26/11 180/102    Assessment: Hypertension control:  controlled  Progress toward goals:  at goal Barriers to meeting goals:  nonadherence to medications, lack of understanding of disease management and [facility] is helping him take his medication correctly.   Plan: Hypertension treatment:  continue current medications [facility] states that he is currently taking the diltiazem 90 mg BID and his BP and pulse look good today.  We will leave him on this and continue to follow up with him.  I'm happy that his BP is finally better controlled and is likely secondary to his good compliance with his CPAP.

## 2011-10-17 NOTE — Assessment & Plan Note (Signed)
Lab Results  Component Value Date   HGBA1C 8.9 07/05/2011   HGBA1C 8.8 03/07/2011   Lab Results  Component Value Date   MICROALBUR 37.60* 10/27/2010   LDLCALC  Value: 73        Total Cholesterol/HDL:CHD Risk Coronary Heart Disease Risk Table                     Men   Women  1/2 Average Risk   3.4   3.3  Average Risk       5.0   4.4  2 X Average Risk   9.6   7.1  3 X Average Risk  23.4   11.0        Use the calculated Patient Ratio above and the CHD Risk Table to determine the patient's CHD Risk.        ATP III CLASSIFICATION (LDL):  <100     mg/dL   Optimal  100-129  mg/dL   Near or Above                    Optimal  130-159  mg/dL   Borderline  160-189  mg/dL   High  >190     mg/dL   Very High 11/23/2010   CREATININE 2.48* 07/26/2011   His last A1C was stable from last and he states that he is taking his medication.  I encouraged him to work on continuing to take his medications as well as working on diet and exercise.

## 2011-10-22 ENCOUNTER — Ambulatory Visit (INDEPENDENT_AMBULATORY_CARE_PROVIDER_SITE_OTHER): Payer: Medicare Other | Admitting: Pharmacist

## 2011-10-22 DIAGNOSIS — I4891 Unspecified atrial fibrillation: Secondary | ICD-10-CM | POA: Diagnosis not present

## 2011-10-22 DIAGNOSIS — Z7901 Long term (current) use of anticoagulants: Secondary | ICD-10-CM

## 2011-10-22 LAB — POCT INR: INR: 2.8

## 2011-10-22 NOTE — Patient Instructions (Signed)
Patient instructed to take medications as defined in the Anti-coagulation Track section of this encounter.  Patient instructed to take today's dose.  Patient verbalized understanding of these instructions.    

## 2011-10-22 NOTE — Progress Notes (Signed)
Anti-Coagulation Progress Note  Patrick [name] is a 66 y.o. male who is currently on an anti-coagulation regimen.    RECENT RESULTS: Recent results are below, the most recent result is correlated with a dose of 32.5 mg. per week: Lab Results  Component Value Date   INR 2.80 10/22/2011   INR 2.80 09/24/2011   INR 2.20 08/31/2011    ANTI-COAG DOSE:   Latest dosing instructions   Total Sun Mon Tue Wed Thu Fri Sat   32.5 5 mg 5 mg 5 mg 2.5 mg 5 mg 5 mg 5 mg    (5 mg1) (5 mg1) (5 mg1) (5 mg0.5) (5 mg1) (5 mg1) (5 mg1)         ANTICOAG SUMMARY: Anticoagulation Episode Summary              Current INR goal 2.0-3.0 Next INR check 11/19/2011   INR from last check 2.80 (10/22/2011)     Weekly max dose (mg)  Target end date Indefinite   Indications ATRIAL FIBRILLATION, PAROXYSMAL, CHRONIC, Long term current use of anticoagulant   INR check location  Preferred lab    Send INR reminders to [facility]   Comments        Provider Role Specialty Phone number   Bartholomew Crews, MD  Internal Medicine [phone]        ANTICOAG TODAY: Anticoagulation Summary as of 10/22/2011              INR goal 2.0-3.0     Selected INR 2.80 (10/22/2011) Next INR check 11/19/2011   Weekly max dose (mg)  Target end date Indefinite   Indications ATRIAL FIBRILLATION, PAROXYSMAL, CHRONIC, Long term current use of anticoagulant    Anticoagulation Episode Summary              INR check location  Preferred lab    Send INR reminders to [facility]   Comments        Provider Role Specialty Phone number   Bartholomew Crews, MD  Internal Medicine [phone]        PATIENT INSTRUCTIONS: Patient Instructions  Patient instructed to take medications as defined in the Anti-coagulation Track section of this encounter.  Patient instructed to take today's dose.  Patient verbalized understanding of these instructions.        FOLLOW-UP Return in 4 weeks (on 11/19/2011) for Follow up INR.  [name], III Pharm.D., CACP

## 2011-11-07 ENCOUNTER — Other Ambulatory Visit: Payer: Self-pay | Admitting: Internal Medicine

## 2011-11-19 ENCOUNTER — Ambulatory Visit (INDEPENDENT_AMBULATORY_CARE_PROVIDER_SITE_OTHER): Payer: Medicare Other | Admitting: Pharmacist

## 2011-11-19 DIAGNOSIS — Z7901 Long term (current) use of anticoagulants: Secondary | ICD-10-CM | POA: Diagnosis not present

## 2011-11-19 DIAGNOSIS — I4891 Unspecified atrial fibrillation: Secondary | ICD-10-CM | POA: Diagnosis not present

## 2011-11-19 NOTE — Progress Notes (Signed)
Anti-Coagulation Progress Note  Patrick [name] is a 66 y.o. male who is currently on an anti-coagulation regimen.    RECENT RESULTS: Recent results are below, the most recent result is correlated with a dose of 32.5 mg. per week: Lab Results  Component Value Date   INR 2.90 11/19/2011   INR 2.80 10/22/2011   INR 2.80 09/24/2011    ANTI-COAG DOSE:   Latest dosing instructions   Total Sun Mon Tue Wed Thu Fri Sat   30 5 mg 2.5 mg 5 mg 5 mg 2.5 mg 5 mg 5 mg    (5 mg1) (5 mg0.5) (5 mg1) (5 mg1) (5 mg0.5) (5 mg1) (5 mg1)         ANTICOAG SUMMARY: Anticoagulation Episode Summary              Current INR goal 2.0-3.0 Next INR check 12/31/2011   INR from last check 2.90 (11/19/2011)     Weekly max dose (mg)  Target end date Indefinite   Indications ATRIAL FIBRILLATION, PAROXYSMAL, CHRONIC, Long term current use of anticoagulant   INR check location  Preferred lab    Send INR reminders to [facility]   Comments        Provider Role Specialty Phone number   Bartholomew Crews, MD  Internal Medicine [phone]        ANTICOAG TODAY: Anticoagulation Summary as of 11/19/2011              INR goal 2.0-3.0     Selected INR 2.90 (11/19/2011) Next INR check 12/31/2011   Weekly max dose (mg)  Target end date Indefinite   Indications ATRIAL FIBRILLATION, PAROXYSMAL, CHRONIC, Long term current use of anticoagulant    Anticoagulation Episode Summary              INR check location  Preferred lab    Send INR reminders to [facility]   Comments        Provider Role Specialty Phone number   Bartholomew Crews, MD  Internal Medicine [phone]        PATIENT INSTRUCTIONS: Patient Instructions  Patient instructed to take medications as defined in the Anti-coagulation Track section of this encounter.  Patient instructed to take today's dose.  Patient verbalized understanding of these instructions.        FOLLOW-UP Return in 6 weeks (on 12/31/2011) for Follow up INR.  [name], III Pharm.D., CACP

## 2011-11-19 NOTE — Patient Instructions (Signed)
Patient instructed to take medications as defined in the Anti-coagulation Track section of this encounter.  Patient instructed to take today's dose.  Patient verbalized understanding of these instructions.    

## 2011-11-26 ENCOUNTER — Encounter: Payer: Self-pay | Admitting: Internal Medicine

## 2011-11-26 ENCOUNTER — Ambulatory Visit (INDEPENDENT_AMBULATORY_CARE_PROVIDER_SITE_OTHER): Payer: Medicare Other | Admitting: Internal Medicine

## 2011-11-26 VITALS — BP 190/80 | HR 92 | Temp 97.1°F

## 2011-11-26 DIAGNOSIS — M109 Gout, unspecified: Secondary | ICD-10-CM

## 2011-11-26 DIAGNOSIS — I10 Essential (primary) hypertension: Secondary | ICD-10-CM | POA: Diagnosis not present

## 2011-11-26 MED ORDER — CHLORTHALIDONE 25 MG PO TABS: 25.0000 mg | ORAL_TABLET | Freq: Every day | ORAL | Status: DC

## 2011-11-26 MED ORDER — PREDNISONE 20 MG PO TABS: 20.0000 mg | ORAL_TABLET | Freq: Every day | ORAL | Status: DC

## 2011-11-26 MED ORDER — BENAZEPRIL HCL 40 MG PO TABS: 40.0000 mg | ORAL_TABLET | Freq: Every day | ORAL | Status: DC

## 2011-11-26 NOTE — Progress Notes (Signed)
Patient ID: Patrick [name], male   DOB: [birth year], 66 y.o.   MRN: [id number] HPI:    Right foot pain. Hx of gout; ate red meat last night. Denies any trauma, fever, chills, swelling, calf pain, SOB, palpitations or any other Sx. Review of Systems: Negative except per history of present illness  Physical Exam:  Nursing notes and vitals reviewed General:  alert, well-developed, and cooperative to examination.   Lungs:  normal respiratory effort, no accessory muscle use, normal breath sounds, no crackles, and no wheezes. Heart:  normal rate, regular rhythm, no murmurs, no gallop, and no rub.   Abdomen:  soft, non-tender, normal bowel sounds, no distention, no guarding, no rebound tenderness, no hepatomegaly, and no splenomegaly.   Extremities:  No cyanosis, clubbing; right foot  dorsal surface,erythema,edema, pain and increase in temperature noted.pedal pulses 2+/4 bilaterally. Neurologic:  alert & oriented X3, nonfocal exam  Meds: Reviewed.  Takes 40 units of Novolin instead of prescribed 25 units!  (Not in a hospital admission)  Allergies: Review of patient's allergies indicates no known allergies. Past Medical History  Diagnosis Date  . Diabetes mellitus type II     Insulin dependent  . Hypertension   . Sleep apnea 07/2010  . CKD (chronic kidney disease), stage II   . Paroxysmal atrial fibrillation     on coumadin  . CAD (coronary artery disease)   . Depression   . Gout   . Hyperlipidemia   . CVA (cerebral infarction) 11/22/10    Thalamic with residual memory loss and slow speech   No past surgical history on file. No family history on file. History   Social History  . Marital Status: Married    [name]    Number of Children: N/A  . Years of Education: N/A   Occupational History  . Not on file.   Social History Main Topics  . Smoking status: Former Smoker    Types: Cigarettes    Quit date: 10/26/1976  . Smokeless tobacco: Never Used  . Alcohol Use: No  .  Drug Use: No  . Sexually Active: Not on file   Other Topics Concern  . Not on file   Social History Narrative  . No narrative on file   A/P: 1. Left foot gout -prednisone for 10 days -decrease use of meats in diet  2. HTN, poorly controlled due to the patient's lack of adherence with anti-HTN meds -risks of HTN explained to the patient and his wife -strongly advised to restart his medications as prescribed -low salt diet -weight management -if no improvement in BP readings, consider to simplify his anti-HTN regimen down to Lasix 40 mg Po daily and Lisinopril 40 mg PO daily (both on a $4 list) and in order to improve his compliance with the medications.  3. DM, type 2 -poorly controlled -d/C glipizide in a setting of increased risks of hypoglycemia with a concomitant INCORRECT dose of Novolin NPH insulin used ( injects 40 units instead of 25). - Consider to change to Glargine  or NPH 70/30 bid ->will leave this up to his PCP Dr. Obie Dredge. -risks and S:S of hypoglycemia reviewed with the patient and his wife - Strongly advised to bring all his medications including insulin and meter, with each appointment  4. CKD, stage III, likely due to poorly controlled DM, type 2 and HTN - strongly advised to f/u with a nephrologist in May as was instructed.  5. Depression -patient denies having this Dx. However, has a  very flat affect and apathetic to most of the questions. -Denies SI/HI or mania -decline medication or psychotherapy -patient was encouraged to contact us if changes his mind and or go to ED/call 911 if feels worse.  F/U in 10 days or sooner if needed.

## 2011-11-26 NOTE — Patient Instructions (Addendum)
Please, check your blood sugar every morning upon awakening. Please, call if your blood sugars are >300. Please, bring your meter with every office visit. Please, bring all your medications (including insulin) with you next appointment. Please, stop taking Glipizide due to a high risk of low blood sugars !!!! Please, DECREASE insulin dose down to 25 Units (NOT 40 units) !!! Please, take ALL your medications As instructed and make a follow up appointment in 10-14 days.

## 2011-12-14 ENCOUNTER — Ambulatory Visit (INDEPENDENT_AMBULATORY_CARE_PROVIDER_SITE_OTHER): Payer: Medicare Other | Admitting: Internal Medicine

## 2011-12-14 VITALS — BP 177/93 | HR 52 | Temp 98.0°F | Ht 69.0 in | Wt 212.7 lb

## 2011-12-14 DIAGNOSIS — N183 Chronic kidney disease, stage 3 unspecified: Secondary | ICD-10-CM | POA: Diagnosis not present

## 2011-12-14 DIAGNOSIS — G4730 Sleep apnea, unspecified: Secondary | ICD-10-CM

## 2011-12-14 DIAGNOSIS — E119 Type 2 diabetes mellitus without complications: Secondary | ICD-10-CM | POA: Diagnosis not present

## 2011-12-14 DIAGNOSIS — I10 Essential (primary) hypertension: Secondary | ICD-10-CM | POA: Diagnosis not present

## 2011-12-14 LAB — LIPID PANEL
Cholesterol: 136 mg/dL (ref 0–200)
HDL: 54 mg/dL (ref >39)
Total CHOL/HDL Ratio: 2.5 Ratio
VLDL: 21 mg/dL (ref 0–40)

## 2011-12-14 LAB — BASIC METABOLIC PANEL
Calcium: 8.9 mg/dL (ref 8.4–10.5)
Creat: 2.68 mg/dL — ABNORMAL HIGH (ref 0.50–1.35)
Glucose, Bld: 121 mg/dL — ABNORMAL HIGH (ref 70–99)
Sodium: 143 mEq/L (ref 135–145)

## 2011-12-14 MED ORDER — GLIPIZIDE 10 MG PO TABS: 15.0000 mg | ORAL_TABLET | Freq: Two times a day (BID) | ORAL | Status: DC

## 2011-12-14 MED ORDER — GLUCOSE BLOOD VI STRP: ORAL_STRIP | Status: DC

## 2011-12-14 NOTE — Progress Notes (Signed)
Subjective:   Patient ID: Patrick [name] male   DOB: Oct 16, 1945 66 y.o.   MRN: [id number]  HPI: Patrick [name] is a 66 y.o. man who is here for follow up of his last visit.  We have been trying to get his blood sugars under control.  He states that he continues to miss days of his insulin.  His wife is not present today but he states that he is in charge of all of his medications.  He continues to take the glipizide 15 mg BID.  When asked he states that his dose of insulin is 25 units at bedtime but that he misses more days then not.  He is in need of an eye exam as well as a   He has been using his CPAP mask 5 of 7 nights of the week.  He states that it seems to work well for him.  He does state that he is still sleepy during the day.    He denies any swelling, nausea, vomiting, or abdominal pain.  He has no noticed any changes in his urination recently and states that the diuretic does make him urinate.    Past Medical History  Diagnosis Date  . Diabetes mellitus type II     Insulin dependent  . Hypertension   . Sleep apnea 07/2010  . CKD (chronic kidney disease), stage II   . Paroxysmal atrial fibrillation     on coumadin  . CAD (coronary artery disease)   . Depression   . Gout   . Hyperlipidemia   . 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 aspirin 81 MG EC tablet Take 81 mg by mouth daily.        . benazepril (LOTENSIN) 40 MG tablet Take 1 tablet (40 mg total) by mouth daily.  62 tablet  5  . Blood Glucose Monitoring Suppl (ACCU-CHEK AVIVA PLUS) W/DEVICE KIT 1 each by Does not apply route as needed.  1 kit  0  . chlorthalidone (HYGROTON) 25 MG tablet Take 1 tablet (25 mg total) by mouth daily.  90 tablet  4  . cloNIDine (CATAPRES) 0.3 MG tablet TAKE 1 TABLET TWICE DAILY  60 tablet  2  . diltiazem (CARDIZEM) 90 MG tablet Take 1 tablet (90 mg total) by mouth 2 (two)  times daily.  180 tablet  4  . glucose blood (TRUETRACK TEST) test strip Use to test blood sugar 2-3 times daily  100 each  11  . insulin NPH (NOVOLIN N) 100 UNIT/ML injection Inject 25 Units into the skin at bedtime.  10 mL  6  . Insulin Syringe-Needle U-100 (ELITE-THIN INS SYR .5CC/31G) 31G X 5/16" 0.5 ML MISC 1 each by Does not apply route 3 (three) times daily. Use to inject insulin daily  100 each  11  . Lancets 30G MISC 1 each by Does not apply route 3 (three) times daily. Use to test blood glucose 2-3 times daily  100 each  11  . Omega-3 Fatty Acids (FISH OIL) 1000 MG CAPS Take 1 capsule by mouth 2 (two) times daily.        . potassium chloride (MICRO-K) 10 MEQ CR capsule Take 1 capsule (10 mEq total) by mouth 2 (two) times daily.  30 capsule  0  . pravastatin (PRAVACHOL) 40 MG tablet TAKE TWO TABLETS BY MOUTH (80 MG TOTAL) EVERY DAY  60  tablet  6  . warfarin (COUMADIN) 5 MG tablet Take 1 tablet (5 mg total) by mouth as directed.  115 tablet  1   No family history on file. History   Social History  . Marital Status: Married    [name]    Number of Children: N/A  . Years of Education: N/A   Social History Main Topics  . Smoking status: Former Smoker    Types: Cigarettes    Quit date: 10/26/1976  . Smokeless tobacco: Never Used  . Alcohol Use: No  . Drug Use: No  . Sexually Active: Not on file   Other Topics Concern  . Not on file   Social History Narrative  . No narrative on file   Review of Systems: Constitutional: Denies fever, chills, diaphoresis, appetite change and fatigue.  HEENT: Denies photophobia, eye pain, redness, hearing loss, ear pain, congestion, sore throat, rhinorrhea, sneezing, mouth sores, trouble swallowing, neck pain, neck stiffness and tinnitus.   Respiratory: Denies SOB, DOE, cough, chest tightness,  and wheezing.   Cardiovascular: Denies chest pain, palpitations and leg swelling.  Gastrointestinal: Denies nausea, vomiting, abdominal pain,  diarrhea, constipation, blood in stool and abdominal distention.  Genitourinary: Denies dysuria, urgency, frequency, hematuria, flank pain and difficulty urinating.  Musculoskeletal: Denies myalgias, back pain, joint swelling, arthralgias and gait problem.  Skin: Denies pallor, rash and wound.  Neurological: Denies dizziness, seizures, syncope, weakness, light-headedness, numbness and headaches.  Hematological: Denies adenopathy. Easy bruising, personal or family bleeding history  Psychiatric/Behavioral: Denies suicidal ideation, mood changes, confusion, nervousness, sleep disturbance and agitation  Objective:  Physical Exam: Filed Vitals:   12/14/11 1438  BP: 177/93  Pulse: 52  Temp: 98 F (36.7 C)  TempSrc: Oral  Height: 5\' 9"  (1.753 m)  Weight: 212 lb 11.2 oz (96.48 kg)   Constitutional: Vital signs reviewed.  Patient is a well-developed and well-nourished man in no acute distress and cooperative with exam. Alert and oriented x3.  Head: Normocephalic and atraumatic Ear: TM normal bilaterally Mouth: no erythema or exudates, MMM Eyes: PERRL, EOMI, conjunctivae normal, No scleral icterus.  Neck: Supple, Trachea midline normal ROM, No JVD, mass, thyromegaly, or carotid bruit present.  Cardiovascular: +1 lower extremity swelling to the bilateral knee, RRR, S1 normal, S2 normal, no MRG, pulses symmetric and intact bilaterally Pulmonary/Chest: CTAB, no wheezes, rales, or rhonchi Abdominal: Soft. Non-tender, non-distended, bowel sounds are normal, no masses, organomegaly, or guarding present.  GU: no CVA tenderness Musculoskeletal: No joint deformities, erythema, or stiffness, ROM full and no nontender Hematology: no cervical, inginal, or axillary adenopathy.  Neurological: A&O x3, Strength is normal and symmetric bilaterally, cranial nerve II-XII are grossly intact, no focal motor deficit, sensory intact to light touch bilaterally.  Skin: Warm, dry and intact. No rash, cyanosis, or  clubbing.  Psychiatric: depressed mood and flat affect. speech is slow but organized. Behavior is normal. Judgment and insight is poor.  Thought content normal. Cognition is slowed and short term memory appears diminished.  Assessment & Plan:

## 2011-12-14 NOTE — Patient Instructions (Addendum)
1.  We will work to get you set up with a Kidney doctor.    2.  You need to make sure you are taking your insulin EVERY day.  3. You need to check your blood sugar twice daily for the next two weeks.  Make sure to bring your meter with you to your follow up visit.  4.  Follow up in 2 weeks.

## 2011-12-17 ENCOUNTER — Telehealth: Payer: Self-pay | Admitting: Dietician

## 2011-12-17 DIAGNOSIS — E119 Type 2 diabetes mellitus without complications: Secondary | ICD-10-CM

## 2011-12-17 NOTE — Telephone Encounter (Signed)
Called patient Blood sugars: every morning and evening before he eats Today: 166 this am.     Nancy Fetter -116/222    Sat  107/257 Friday- 147/   Glipizide- taking 1 and a half twice daily 25 NPH- yes- has taken every day as ordered since Friday.  He does not see any barriers to continuing taking his insulin each night. Discussed other options for improving blood sugar. He is willing to try an injection in the morning if it costs the same as the insulin he is on now. He agreed to have CDE call him in tow weeks to follow up.

## 2011-12-23 NOTE — Assessment & Plan Note (Signed)
Basic Metabolic Panel:    Component Value Date/Time   NA 143 12/14/2011 1515   K 3.5 12/14/2011 1515   CL 102 12/14/2011 1515   CO2 31 12/14/2011 1515   BUN 38* 12/14/2011 1515   CREATININE 2.68* 12/14/2011 1515   CREATININE 2.31* 11/30/2010 1130   GLUCOSE 121* 12/14/2011 1515   CALCIUM 8.9 12/14/2011 1515   His creatinine continues to slowly rise which is concerning.  He has no overt signs of renal failure but I'm concerned that he is progressing quickly.  We will work to get him over to the nephrologist as soon as possible.

## 2011-12-23 NOTE — Assessment & Plan Note (Signed)
With his CKD he is not a candidate for NSAID therapy so if he does have a gout flare, corticosteroids are the treatment of choice for him.  He is on a hefty dose of lasix which could also worsen his gout.  Currently his flare appears to be over so we will just continue to monitor for now.

## 2011-12-23 NOTE — Assessment & Plan Note (Signed)
He still has not gone to see the nephrologist.  We discussed today the implications of his rising creatinine and the possibility of needing to start dialysis if he doesn't get better control of his diabetes and hypertension.  He and his wife would like to hold off the referral for now because they are still trying to wrap their heads around the diagnosis and possibility of him needing to do dialysis.

## 2011-12-23 NOTE — Assessment & Plan Note (Signed)
Lab Results  Component Value Date   CHOL 136 12/14/2011   CHOL  Value: 146        ATP III CLASSIFICATION:  <200     mg/dL   Desirable  200-239  mg/dL   Borderline High  >=240    mg/dL   High        11/23/2010   CHOL 153 10/27/2010   Lab Results  Component Value Date   HDL 54 12/14/2011   HDL 46 11/23/2010   HDL 34* 10/27/2010   Lab Results  Component Value Date   LDLCALC 61 12/14/2011   LDLCALC  Value: 73        Total Cholesterol/HDL:CHD Risk Coronary Heart Disease Risk Table                     Men   Women  1/2 Average Risk   3.4   3.3  Average Risk       5.0   4.4  2 X Average Risk   9.6   7.1  3 X Average Risk  23.4   11.0        Use the calculated Patient Ratio above and the CHD Risk Table to determine the patient's CHD Risk.        ATP III CLASSIFICATION (LDL):  <100     mg/dL   Optimal  100-129  mg/dL   Near or Above                    Optimal  130-159  mg/dL   Borderline  160-189  mg/dL   High  >190     mg/dL   Very High 11/23/2010   LDLCALC 87 10/27/2010   Lab Results  Component Value Date   TRIG 107 12/14/2011   TRIG 135 11/23/2010   TRIG 162* 10/27/2010   Lab Results  Component Value Date   CHOLHDL 2.5 12/14/2011   CHOLHDL 3.2 11/23/2010   CHOLHDL 4.5 10/27/2010   No results found for this basename: LDLDIRECT   His LDL is controlled at goal.  We will continue to monitor.

## 2011-12-23 NOTE — Assessment & Plan Note (Signed)
He is using his CPAP a reported 5 days out of 7.  I encouraged him to use it every night to help him rest as much as possible.

## 2011-12-23 NOTE — Assessment & Plan Note (Signed)
Lab Results  Component Value Date   HGBA1C 11.2 12/14/2011   HGBA1C 10.8 10/12/2011   HGBA1C 8.9 07/05/2011   Lab Results  Component Value Date   MICROALBUR 37.60* 10/27/2010   LDLCALC 61 12/14/2011   CREATININE 2.68* 12/14/2011    His A1C even over the last 2 months continues to rise.  He states that he is missing more days then not and still has no other way to get his medication because he does not have a part D for his medicare.  I discussed the patient with Debera Lat and she will follow up with Mr. Soto.  I encouraged him to take his medication and to have his wife remind him to take it.  I shared my concern that last time he let his diabetes get this out of control he had his stroke.    Per note from [facility] at his follow up, if he is not improved we will stop the glypizide and switch him to 70/30 insulin 25 units with breakfast daily and follow him to see how he does.  The other option would be to change his insulin N to twice daily at 10 or 15 units BID.

## 2011-12-23 NOTE — Assessment & Plan Note (Signed)
Lab Results  Component Value Date   HGBA1C 10.8 10/12/2011   HGBA1C 8.9 07/05/2011   Lab Results  Component Value Date   MICROALBUR 37.60* 10/27/2010   LDLCALC 61 12/14/2011   CREATININE 2.68* 12/14/2011   Patrick [name]'s A1C has risen again today.  He states that he is taking his insulin but "misses days."  We discussed that he needs to be taking the insulin EVERY day as well as watching his diet because the last time this happened he had his stroke.  He did bring his meter which only has 5 readings on it spread over the month.  We will have him take his medication every day and to check his blood sugar every day twice daily for the next 2 months and bring that to his follow up so we can discuss adjusting his medications.

## 2011-12-23 NOTE — Assessment & Plan Note (Signed)
BP Readings from Last 10 Encounters:  10/12/11 132/80  08/31/11 138/87  07/26/11 180/102  07/19/11 173/111  07/05/11 176/86  05/10/11 149/86  04/09/11 140/72  03/07/11 161/92   His blood pressure his last two visits has been better controlled then previous.  He does have some questionable compliance with his CPAP as well as his BID medications.  We will continue to monitor on the same regimen.

## 2011-12-23 NOTE — Assessment & Plan Note (Signed)
Lab Results  Component Value Date   NA 143 12/14/2011   K 3.5 12/14/2011   CL 102 12/14/2011   CO2 31 12/14/2011   BUN 38* 12/14/2011   CREATININE 2.68* 12/14/2011   CREATININE 2.31* 11/30/2010    BP Readings from Last 3 Encounters:  12/14/11 177/93  11/26/11 190/80  10/12/11 132/80    Assessment: Hypertension control:  moderately elevated  Progress toward goals:  deteriorated Barriers to meeting goals:  nonadherence to medications and lack of understanding of disease management  Plan: Hypertension treatment:  continue current medications We know that his medications work when he takes them so I encouraged him to continue to take his medications as prescribed.

## 2011-12-28 ENCOUNTER — Ambulatory Visit (INDEPENDENT_AMBULATORY_CARE_PROVIDER_SITE_OTHER): Payer: Medicare Other | Admitting: Internal Medicine

## 2011-12-28 ENCOUNTER — Encounter: Payer: Medicare Other | Admitting: Internal Medicine

## 2011-12-28 ENCOUNTER — Encounter: Payer: Self-pay | Admitting: Internal Medicine

## 2011-12-28 VITALS — BP 133/83 | HR 50 | Temp 98.4°F | Ht 69.0 in | Wt 215.6 lb

## 2011-12-28 DIAGNOSIS — E119 Type 2 diabetes mellitus without complications: Secondary | ICD-10-CM

## 2011-12-28 LAB — GLUCOSE, CAPILLARY: Glucose-Capillary: 219 mg/dL — ABNORMAL HIGH (ref 70–99)

## 2011-12-28 NOTE — Progress Notes (Signed)
Patient ID: Patrick [name], male   DOB: Dec 25, 1945, 66 y.o.   MRN: [id number]  Subjective:     HPI: Patient is a 66 old man here today for followup of his diabetes.  Patient is currently using 25u at night before bed of Novolin N as well as bid glipizide.  He states he has missed a dose of his glipizide and diltiazem last week; states he was busy that day and forgot.  He also missed his insulin dose that day but states he has not missed any other doses.  Review of his CBGS reveals CBG ave range of 140-240.  He had one low 88 and one high of 357.  He denies any symptoms of hypoglycemia.  He has no concerns or complaints today. Review of Systems Constitutional: Negative for fever, chills, diaphoresis, activity change, appetite change, fatigue and unexpected weight change.  HENT: Negative for hearing loss, congestion and neck stiffness.   Eyes: Negative for photophobia, pain and visual disturbance.  Respiratory: Negative for cough, chest tightness, shortness of breath and wheezing.   Cardiovascular: Negative for chest pain and palpitations.  Gastrointestinal: Negative for abdominal pain, blood in stool and anal bleeding.  Genitourinary: Negative for dysuria, hematuria and difficulty urinating.  Musculoskeletal: Negative for joint swelling.  Neurological: Negative for dizziness, syncope, speech difficulty, weakness, numbness and headaches. ]    Objective:   Physical Exam VItal signs reviewed and stable. GEN: No apparent distress.  Alert and oriented x 3.  Pleasant, conversant, and cooperative to exam. HEENT: head is autraumatic and normocephalic.  Neck is supple without palpable masses or lymphadenopathy.   Vision intact.  EOMI.  PERRLA.  Sclerae anicteric.  Conjunctivae without pallor or injection. Mucous membranes are moist.   RESP:  Lungs are clear to ascultation bilaterally with good air movement.  No wheezes, ronchi, or rubs. CARDIOVASCULAR: regular rate, normal rhythm.  Clear S1, S2, no  murmurs, gallops, or rubs.Marland Kitchen SKIN: warm and dry with normal turgor.  No rashes or abnormal lesions observed. NEURO:  No focal deficit.     Assessment/Plan:

## 2011-12-28 NOTE — Patient Instructions (Signed)
Schedule a followup appointment with Dr. Obie Dredge in 2-3 months. Continue to take all of your medicine as directed.  You are doing an excellent job with your blood sugars.  Keep up the good work!!!

## 2011-12-28 NOTE — Assessment & Plan Note (Signed)
Patient appears to have improved his compliance with his antihyperglycemic regimen. He admits to only one missed dose of glipizide and one missed dose of insulin in the last week. Review of his CBG log reveals his sugars are averaging at 200.  Advised him to continue with his current regimen and encouraged him to not miss any doses of his medications. We'll have him followup in 2-3 months for repeat hemoglobin A 1C and to assess his adherence to treatment plan.

## 2011-12-31 ENCOUNTER — Ambulatory Visit (INDEPENDENT_AMBULATORY_CARE_PROVIDER_SITE_OTHER): Payer: Medicare Other | Admitting: Pharmacist

## 2011-12-31 DIAGNOSIS — I4891 Unspecified atrial fibrillation: Secondary | ICD-10-CM | POA: Diagnosis not present

## 2011-12-31 DIAGNOSIS — Z7901 Long term (current) use of anticoagulants: Secondary | ICD-10-CM

## 2011-12-31 NOTE — Progress Notes (Signed)
Anti-Coagulation Progress Note  Patrick [name] is a 67 y.o. male who is currently on an anti-coagulation regimen.    RECENT RESULTS: Recent results are below, the most recent result is correlated with a dose of 30 mg. per week: Lab Results  Component Value Date   INR 2.80 12/31/2011   INR 2.90 11/19/2011   INR 2.80 10/22/2011    ANTI-COAG DOSE:   Latest dosing instructions   Total Sun Mon Tue Wed Thu Fri Sat   30 5 mg 2.5 mg 5 mg 5 mg 2.5 mg 5 mg 5 mg    (5 mg1) (5 mg0.5) (5 mg1) (5 mg1) (5 mg0.5) (5 mg1) (5 mg1)         ANTICOAG SUMMARY: Anticoagulation Episode Summary              Current INR goal 2.0-3.0 Next INR check 01/28/2012   INR from last check 2.80 (12/31/2011)     Weekly max dose (mg)  Target end date Indefinite   Indications ATRIAL FIBRILLATION, PAROXYSMAL, CHRONIC, Long term current use of anticoagulant   INR check location  Preferred lab    Send INR reminders to ANTICOAG IMP   Comments        Provider Role Specialty Phone number   Bartholomew Crews, MD  Internal Medicine [phone]        ANTICOAG TODAY: Anticoagulation Summary as of 12/31/2011              INR goal 2.0-3.0     Selected INR 2.80 (12/31/2011) Next INR check 01/28/2012   Weekly max dose (mg)  Target end date Indefinite   Indications ATRIAL FIBRILLATION, PAROXYSMAL, CHRONIC, Long term current use of anticoagulant    Anticoagulation Episode Summary              INR check location  Preferred lab    Send INR reminders to ANTICOAG IMP   Comments        Provider Role Specialty Phone number   Bartholomew Crews, MD  Internal Medicine [phone]        PATIENT INSTRUCTIONS: Patient Instructions  Patient instructed to take medications as defined in the Anti-coagulation Track section of this encounter.  Patient instructed to take today's dose.  Patient verbalized understanding of these instructions.        FOLLOW-UP Return in 4 weeks (on 01/28/2012) for Follow up INR at  0845h.  [name], III Pharm.D., CACP

## 2011-12-31 NOTE — Patient Instructions (Signed)
Patient instructed to take medications as defined in the Anti-coagulation Track section of this encounter.  Patient instructed to take today's dose.  Patient verbalized understanding of these instructions.    

## 2012-01-11 ENCOUNTER — Telehealth: Payer: Self-pay | Admitting: Dietician

## 2012-01-11 NOTE — Telephone Encounter (Signed)
Called to follow up per conversation with patient and encourage continued insulin administration consistency. Left message.

## 2012-01-17 ENCOUNTER — Telehealth: Payer: Self-pay | Admitting: Dietician

## 2012-01-28 ENCOUNTER — Ambulatory Visit (INDEPENDENT_AMBULATORY_CARE_PROVIDER_SITE_OTHER): Payer: Medicare Other | Admitting: Pharmacist

## 2012-01-28 DIAGNOSIS — Z7901 Long term (current) use of anticoagulants: Secondary | ICD-10-CM | POA: Diagnosis not present

## 2012-01-28 DIAGNOSIS — I4891 Unspecified atrial fibrillation: Secondary | ICD-10-CM | POA: Diagnosis not present

## 2012-01-28 NOTE — Progress Notes (Signed)
Anti-Coagulation Progress Note  Patrick [name] is a 66 y.o. male who is currently on an anti-coagulation regimen.    RECENT RESULTS: Recent results are below, the most recent result is correlated with a dose of 30 mg. per week: Lab Results  Component Value Date   INR 2.50 01/28/2012   INR 2.80 12/31/2011   INR 2.90 11/19/2011    ANTI-COAG DOSE:   Latest dosing instructions   Total Sun Mon Tue Wed Thu Fri Sat   30 5 mg 2.5 mg 5 mg 5 mg 2.5 mg 5 mg 5 mg    (5 mg1) (5 mg0.5) (5 mg1) (5 mg1) (5 mg0.5) (5 mg1) (5 mg1)         ANTICOAG SUMMARY: Anticoagulation Episode Summary              Current INR goal 2.0-3.0 Next INR check 02/25/2012   INR from last check 2.50 (01/28/2012)     Weekly max dose (mg)  Target end date Indefinite   Indications ATRIAL FIBRILLATION, PAROXYSMAL, CHRONIC, Long term current use of anticoagulant   INR check location  Preferred lab    Send INR reminders to ANTICOAG IMP   Comments        Provider Role Specialty Phone number   Bartholomew Crews, MD  Internal Medicine [phone]        ANTICOAG TODAY: Anticoagulation Summary as of 01/28/2012              INR goal 2.0-3.0     Selected INR 2.50 (01/28/2012) Next INR check 02/25/2012   Weekly max dose (mg)  Target end date Indefinite   Indications ATRIAL FIBRILLATION, PAROXYSMAL, CHRONIC, Long term current use of anticoagulant    Anticoagulation Episode Summary              INR check location  Preferred lab    Send INR reminders to ANTICOAG IMP   Comments        Provider Role Specialty Phone number   Bartholomew Crews, MD  Internal Medicine [phone]        PATIENT INSTRUCTIONS: Patient Instructions  Patient instructed to take medications as defined in the Anti-coagulation Track section of this encounter.  Patient instructed to take today's dose.  Patient verbalized understanding of these instructions.        FOLLOW-UP Return in 4 weeks (on 02/25/2012) for Follow up INR at  3:30PM.  [name], III Pharm.D., CACP

## 2012-01-28 NOTE — Patient Instructions (Signed)
Patient instructed to take medications as defined in the Anti-coagulation Track section of this encounter.  Patient instructed to take today's dose.  Patient verbalized understanding of these instructions.    

## 2012-01-28 NOTE — Addendum Note (Signed)
Addended by: Hulan Fray on: 01/28/2012 02:16 PM   Modules accepted: Orders

## 2012-01-29 NOTE — Telephone Encounter (Signed)
Per social work he can be referred to [facility] case management if needed and desired by patient

## 2012-02-06 ENCOUNTER — Other Ambulatory Visit: Payer: Self-pay | Admitting: *Deleted

## 2012-02-07 MED ORDER — PRAVASTATIN SODIUM 40 MG PO TABS: 80.0000 mg | ORAL_TABLET | Freq: Every day | ORAL | Status: DC

## 2012-02-21 ENCOUNTER — Other Ambulatory Visit: Payer: Self-pay | Admitting: Internal Medicine

## 2012-02-21 ENCOUNTER — Other Ambulatory Visit: Payer: Self-pay | Admitting: *Deleted

## 2012-02-22 MED ORDER — PREDNISONE 20 MG PO TABS: 20.0000 mg | ORAL_TABLET | Freq: Every day | ORAL | Status: DC

## 2012-02-25 ENCOUNTER — Ambulatory Visit: Payer: Medicare Other

## 2012-02-25 NOTE — Telephone Encounter (Signed)
Rx called in 

## 2012-02-29 ENCOUNTER — Ambulatory Visit (INDEPENDENT_AMBULATORY_CARE_PROVIDER_SITE_OTHER): Payer: Medicare Other | Admitting: Internal Medicine

## 2012-02-29 ENCOUNTER — Encounter: Payer: Self-pay | Admitting: Internal Medicine

## 2012-02-29 VITALS — BP 142/92 | HR 91 | Temp 97.7°F | Ht 69.0 in | Wt 214.8 lb

## 2012-02-29 DIAGNOSIS — E119 Type 2 diabetes mellitus without complications: Secondary | ICD-10-CM | POA: Diagnosis not present

## 2012-02-29 DIAGNOSIS — I10 Essential (primary) hypertension: Secondary | ICD-10-CM | POA: Diagnosis not present

## 2012-02-29 DIAGNOSIS — Z7901 Long term (current) use of anticoagulants: Secondary | ICD-10-CM | POA: Diagnosis not present

## 2012-02-29 DIAGNOSIS — I4891 Unspecified atrial fibrillation: Secondary | ICD-10-CM

## 2012-02-29 DIAGNOSIS — N183 Chronic kidney disease, stage 3 unspecified: Secondary | ICD-10-CM | POA: Diagnosis not present

## 2012-02-29 LAB — GLUCOSE, CAPILLARY

## 2012-02-29 LAB — POCT GLYCOSYLATED HEMOGLOBIN (HGB A1C): Hemoglobin A1C: 9.6

## 2012-02-29 MED ORDER — WARFARIN SODIUM 5 MG PO TABS: 5.0000 mg | ORAL_TABLET | ORAL | Status: DC

## 2012-02-29 MED ORDER — GLIPIZIDE 10 MG PO TABS: 15.0000 mg | ORAL_TABLET | Freq: Two times a day (BID) | ORAL | Status: DC

## 2012-02-29 MED ORDER — FAMOTIDINE 20 MG PO TABS: 20.0000 mg | ORAL_TABLET | Freq: Two times a day (BID) | ORAL | Status: DC | PRN

## 2012-02-29 NOTE — Patient Instructions (Addendum)
1.  Great Job taking your insulin!  I am happy with the direction your A1C is going.  Keep up the good work!  2.  Continue your medication as prescribed.    3.  Follow up in 3 months.

## 2012-02-29 NOTE — Progress Notes (Signed)
Subjective:   Patient ID: Patrick [name] male   DOB: [birth year] 66 y.o.   MRN: [id number]  HPI: Patrick [name] is a 66 y.o. man who presents to clinic today for follow up of his chronic medical problems including diabetes, hypertension, CKD stage III, as well as atrial fibrillation. He presents today with his wife with him.  He states that he was supposed follow up with Nephrology on Friday but couldn't walk cause of a gout flare.  They have rescheduled for the end of August to follow up.  He states that he continues to take his medication as prescribed and denies any increase in swelling in his legs or changes in his urination.    He states that he had a gout flare that started Wednesday.  It was bad over the weekend but improved and is not better but not completely resolved.  He has about 5 days of his steroid taper left.  He currently denies any redness of warmth but he states that it still is mildly painful to walk on.    He states that he is not using his CPAP more then once weekly.  When asked he doesn't have a good answer as to why other them "I just fall asleep and forget."  He states that when he does [name] it he feels better, more rested when he uses it.   Past Medical History  Diagnosis Date  . Diabetes mellitus type II     Insulin dependent  . Hypertension   . Sleep apnea 07/2010  . CKD (chronic kidney disease), stage II   . Paroxysmal atrial fibrillation     on coumadin  . CAD (coronary artery disease)   . Depression   . Gout   . Hyperlipidemia   . 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 aspirin 81 MG EC tablet Take 81 mg by mouth daily.        . benazepril (LOTENSIN) 40 MG tablet Take 1 tablet (40 mg total) by mouth daily.  62 tablet  5  . Blood Glucose Monitoring Suppl (ACCU-CHEK AVIVA PLUS) W/DEVICE KIT 1 each by Does not apply route as needed.  1 kit  0  .  chlorthalidone (HYGROTON) 25 MG tablet Take 1 tablet (25 mg total) by mouth daily.  90 tablet  4  . cloNIDine (CATAPRES) 0.3 MG tablet Take 1 tablet (0.3 mg total) by mouth 2 (two) times daily.  60 tablet  3  . diltiazem (CARDIZEM) 90 MG tablet Take 1 tablet (90 mg total) by mouth 2 (two) times daily.  180 tablet  4  . glipiZIDE (GLUCOTROL) 10 MG tablet Take 1.5 tablets (15 mg total) by mouth 2 (two) times daily before a meal.  180 tablet  3  . glucose blood (TRUETRACK TEST) test strip Use to test blood sugar 2-3 times daily  100 each  11  . insulin NPH (NOVOLIN N) 100 UNIT/ML injection Inject 25 Units into the skin at bedtime.  10 mL  6  . Insulin Syringe-Needle U-100 (ELITE-THIN INS SYR .5CC/31G) 31G X 5/16" 0.5 ML MISC 1 each by Does not apply route 3 (three) times daily. Use to inject insulin daily  100 each  11  . Lancets 30G MISC 1 each by Does not apply route 3 (three) times daily. Use to test blood glucose 2-3 times daily  100 each  11  .  Omega-3 Fatty Acids (FISH OIL) 1000 MG CAPS Take 1 capsule by mouth 2 (two) times daily.        . potassium chloride (MICRO-K) 10 MEQ CR capsule Take 1 capsule (10 mEq total) by mouth 2 (two) times daily.  30 capsule  0  . pravastatin (PRAVACHOL) 40 MG tablet Take 2 tablets (80 mg total) by mouth daily. TAKE 2 TABLETS BY MOUTH EVERYDAY.  60 tablet  6  . predniSONE (DELTASONE) 20 MG tablet Take 1 tablet (20 mg total) by mouth daily. Take two tablets by mouth  Daily for 5 days, then one tablet by mouth daily for 5 days. Then stop.  15 tablet  1  . warfarin (COUMADIN) 5 MG tablet Take 1 tablet (5 mg total) by mouth as directed.  115 tablet  1   No family history on file. History   Social History  . Marital Status: Married    [name]    Number of Children: N/A  . Years of Education: N/A   Social History Main Topics  . Smoking status: Former Smoker    Types: Cigarettes    Quit date: 10/26/1976  . Smokeless tobacco: Never Used  . Alcohol Use:  No  . Drug Use: No  . Sexually Active: None   Other Topics Concern  . None   Social History Narrative  . None   Review of Systems: Constitutional: Denies fever, chills, diaphoresis, appetite change and fatigue.  HEENT: Denies photophobia, eye pain, redness, hearing loss, ear pain, congestion, sore throat, rhinorrhea, sneezing, mouth sores, trouble swallowing, neck pain, neck stiffness and tinnitus.   Respiratory: Denies SOB, DOE, cough, chest tightness,  and wheezing.   Cardiovascular: Denies chest pain, palpitations and leg swelling.  Gastrointestinal: Denies nausea, vomiting, abdominal pain, diarrhea, constipation, blood in stool and abdominal distention.  Genitourinary: Denies dysuria, urgency, frequency, hematuria, flank pain and difficulty urinating.  Musculoskeletal: Positive for right MTP swelling. Denies myalgias, back pain, arthralgias and gait problem.  Skin: Denies pallor, rash and wound.  Neurological: Denies dizziness, seizures, syncope, weakness, light-headedness, numbness and headaches.  Hematological: Denies adenopathy. Easy bruising, personal or family bleeding history  Psychiatric/Behavioral: Denies suicidal ideation, mood changes, confusion, nervousness, sleep disturbance and agitation  Objective:  Physical Exam: Filed Vitals:   02/29/12 1450  BP: 142/92  Pulse: 91  Temp: 97.7 F (36.5 C)  TempSrc: Oral  Height: 5\' 9"  (1.753 m)  Weight: 214 lb 12.8 oz (97.433 kg)   Constitutional: Vital signs reviewed.  Patient is a well-developed and well-nourished man  in no acute distress and cooperative with exam. Alert and oriented x3.  Head: Normocephalic and atraumatic Ear: TM normal bilaterally Mouth: no erythema or exudates, MMM Eyes: PERRL, EOMI, conjunctivae normal, No scleral icterus.  Neck: Supple, Trachea midline normal ROM, No JVD, mass, thyromegaly, or carotid bruit present.  Cardiovascular: irregularly irregular rate and rhythm, S1 normal, S2 normal, no  MRG, pulses symmetric and intact bilaterally Pulmonary/Chest: CTAB, no wheezes, rales, or rhonchi Abdominal: Soft. Non-tender, non-distended, bowel sounds are normal, no masses, organomegaly, or guarding present.  GU: no CVA tenderness Musculoskeletal: on the right 1st MTP there is mild pain to palpation and mild swelling, no erythema or warmth noted consistent with a resolving gout flare.  No erythema, or stiffness, ROM full and no nontender in the other MTP joints as well as the ankles and the knees bilaterally.  Hematology: no cervical, inginal, or axillary adenopathy.  Neurological: A&O x3, Strength is normal and symmetric  bilaterally, cranial nerve II-XII are grossly intact, no focal motor deficit, sensory intact to light touch bilaterally.  Skin: Warm, dry and intact. No rash, cyanosis, or clubbing.  Psychiatric: Normal mood and very flat affect. speech is slow but coherent.  Behavior is normal. Judgment and insight are slowed and thought content normal. Cognition appears slowed but stable since his stroke. Memory are normal.   Assessment & Plan:

## 2012-03-11 ENCOUNTER — Other Ambulatory Visit: Payer: Self-pay | Admitting: *Deleted

## 2012-03-11 NOTE — Telephone Encounter (Signed)
Request is for prednisone 20 mg # 15 for gout.  Last filled this 12/31/11 Not list in med list.

## 2012-03-11 NOTE — Telephone Encounter (Signed)
Pt and pharmacy informed.

## 2012-03-11 NOTE — Telephone Encounter (Signed)
This is the second refill that he has gotten without being seen for this.  Please have him make an appointment to be seen and evaluated for possible gout flare.

## 2012-03-17 ENCOUNTER — Ambulatory Visit (INDEPENDENT_AMBULATORY_CARE_PROVIDER_SITE_OTHER): Payer: Medicare Other | Admitting: Pharmacist

## 2012-03-17 ENCOUNTER — Ambulatory Visit (INDEPENDENT_AMBULATORY_CARE_PROVIDER_SITE_OTHER): Payer: Medicare Other | Admitting: Internal Medicine

## 2012-03-17 ENCOUNTER — Encounter: Payer: Self-pay | Admitting: Internal Medicine

## 2012-03-17 VITALS — BP 174/90 | HR 85 | Temp 97.5°F | Ht 69.0 in | Wt 209.0 lb

## 2012-03-17 DIAGNOSIS — M109 Gout, unspecified: Secondary | ICD-10-CM | POA: Diagnosis not present

## 2012-03-17 DIAGNOSIS — Z7901 Long term (current) use of anticoagulants: Secondary | ICD-10-CM

## 2012-03-17 DIAGNOSIS — E119 Type 2 diabetes mellitus without complications: Secondary | ICD-10-CM | POA: Diagnosis not present

## 2012-03-17 DIAGNOSIS — I10 Essential (primary) hypertension: Secondary | ICD-10-CM | POA: Diagnosis not present

## 2012-03-17 DIAGNOSIS — I4891 Unspecified atrial fibrillation: Secondary | ICD-10-CM | POA: Diagnosis not present

## 2012-03-17 LAB — POCT INR: INR: 5.3

## 2012-03-17 LAB — GLUCOSE, CAPILLARY: Glucose-Capillary: 96 mg/dL (ref 70–99)

## 2012-03-17 MED ORDER — PREDNISONE 20 MG PO TABS: 20.0000 mg | ORAL_TABLET | Freq: Every day | ORAL | Status: DC

## 2012-03-17 NOTE — Assessment & Plan Note (Signed)
Patient currently and gout flare and will prescribe prednisone taper. Not candidate for NSAIDs due to creatinine. Did show uric acid level at today's visit and would recommend starting him on long-term gout therapy. He will come back to see his PCP on 8/30 and should address this at this visit.

## 2012-03-17 NOTE — Patient Instructions (Signed)
You were seen for gout flare and we are giving you some prednisone for the gout. Take 1 pill in the morning and 1 pill at night for 5 days. Then take 1 pill in the morning for 5 days then stop. We are checking a blood level for your gout and we may need to start you on a gout medicine. We will see you back in 2 weeks for your gout medicine. Please call with questions or problems at [phone].

## 2012-03-17 NOTE — Assessment & Plan Note (Signed)
Blood pressure is acutely elevated at today's visit due to pain. It would appear that he was very close to goal and his last several visits so will not change regimen at today's visit. Currently taking chlorthalidone, benazepril, clonidine, diltiazem.

## 2012-03-17 NOTE — Patient Instructions (Signed)
Patient instructed to take medications as defined in the Anti-coagulation Track section of this encounter.  Patient instructed to OMIT today's dose.  Patient verbalized understanding of these instructions.    

## 2012-03-17 NOTE — Progress Notes (Signed)
Subjective:     Patient ID: Patrick [name], male   DOB: 21-Sep-1945, 66 y.o.   MRN: [id number]  HPI The patient is a 66 year old man who comes in today for a visit with some gout pain. He states that he was having a gout flare in his right foot at the end of July which he was given a medication for. This did calm down his gout flare however he has gotten a new gout flare in his left foot. It's in his little toe. He states that it's onset was fairly similar to his typical gout flares. He was having exquisite tenderness and was not able to put on his shoe. However he was not able to be seen until today approximately 2-3 days later. At this time he is having less tenderness and is able to bear weight and walk on his foot. He was able to put his shoe on without much difficulty. He has not had any fevers or chills at home. No trauma to the foot including no twisting of his ankle or stubbing of his toe. No extra weightbearing nothing has fallen on his foot. He states that he is never been on gout medication long-term that he knows of. He does bring his medications in for review at today's visit. No additional problems or questions at today's visit. No need for any refills at today's visit.  Review of Systems  Constitutional: Positive for activity change. Negative for fever, chills, appetite change, fatigue and unexpected weight change.  HENT: Negative.   Eyes: Negative.   Respiratory: Negative.  Negative for cough and shortness of breath.   Cardiovascular: Negative.  Negative for chest pain, palpitations and leg swelling.  Gastrointestinal: Negative.  Negative for nausea, vomiting, abdominal pain and diarrhea.  Musculoskeletal: Positive for arthralgias. Negative for myalgias, back pain, joint swelling and gait problem.  Skin: Negative for color change, pallor and wound.  Neurological: Negative.   Hematological: Negative.   Psychiatric/Behavioral: Negative.        Objective:   Physical Exam    Constitutional: He is oriented to person, place, and time. He appears well-developed and well-nourished.  HENT:  Head: Normocephalic and atraumatic.  Eyes: EOM are normal. Pupils are equal, round, and reactive to light.  Neck: Normal range of motion. Neck supple.  Cardiovascular: Normal rate.   Pulmonary/Chest: Effort normal and breath sounds normal.  Abdominal: Soft. Bowel sounds are normal.  Musculoskeletal: Normal range of motion. He exhibits tenderness.       Tenderness in the little toe of left foot. Some redness. Not exquisitely tender to palpation and able to be touched lightly without pain.  Neurological: He is alert and oriented to person, place, and time. No cranial nerve deficit.  Skin: Skin is warm and dry. No rash noted. There is erythema. No pallor.  Psychiatric: He has a normal mood and affect. His behavior is normal. Judgment and thought content normal.       Assessment/Plan:   1. Please see problem-oriented charting.  2. Disposition-the patient will be given prednisone taper for his gout. He also had uric acid level drawn today. He'll be seen back on 8/30 with his PCP for further addressing of his gout and chronic medical problems. Would recommend strongly putting on gout medication at that time. Will not start long-term gout medication today as he is in active flare.

## 2012-03-17 NOTE — Progress Notes (Signed)
Anti-Coagulation Progress Note  Patrick [name] is a 66 y.o. male who is currently on an anti-coagulation regimen.    RECENT RESULTS: Recent results are below, the most recent result is correlated with a dose of 30 mg. per week: Lab Results  Component Value Date   INR 5.3 03/17/2012   INR 2.50 01/28/2012   INR 2.80 12/31/2011    ANTI-COAG DOSE:   Latest dosing instructions   Total Sun Mon Tue Wed Thu Fri Sat   25 5 mg 2.5 mg 2.5 mg 2.5 mg 2.5 mg 5 mg 5 mg    (5 mg1) (5 mg0.5) (5 mg0.5) (5 mg0.5) (5 mg0.5) (5 mg1) (5 mg1)         ANTICOAG SUMMARY: Anticoagulation Episode Summary              Current INR goal 2.0-3.0 Next INR check 03/24/2012   INR from last check 5.3! (03/17/2012)     Weekly max dose (mg)  Target end date Indefinite   Indications ATRIAL FIBRILLATION, PAROXYSMAL, CHRONIC, Long term current use of anticoagulant   INR check location  Preferred lab    Send INR reminders to ANTICOAG IMP   Comments        Provider Role Specialty Phone number   Bartholomew Crews, MD  Internal Medicine [phone]        ANTICOAG TODAY: Anticoagulation Summary as of 03/17/2012              INR goal 2.0-3.0     Selected INR 5.3! (03/17/2012) Next INR check 03/24/2012   Weekly max dose (mg)  Target end date Indefinite   Indications ATRIAL FIBRILLATION, PAROXYSMAL, CHRONIC, Long term current use of anticoagulant    Anticoagulation Episode Summary              INR check location  Preferred lab    Send INR reminders to ANTICOAG IMP   Comments        Provider Role Specialty Phone number   Bartholomew Crews, MD  Internal Medicine [phone]        PATIENT INSTRUCTIONS: Patient Instructions  Patient instructed to take medications as defined in the Anti-coagulation Track section of this encounter.  Patient instructed to OMIT today's dose.  Patient verbalized understanding of these instructions.        FOLLOW-UP Return in 7 days (on 03/24/2012) for Follow up INR at  1000h.  [name], III Pharm.D., CACP

## 2012-03-18 NOTE — Progress Notes (Signed)
Agree 

## 2012-03-24 ENCOUNTER — Ambulatory Visit: Payer: Medicare Other

## 2012-03-28 ENCOUNTER — Ambulatory Visit (INDEPENDENT_AMBULATORY_CARE_PROVIDER_SITE_OTHER): Payer: Medicare Other | Admitting: Internal Medicine

## 2012-03-28 VITALS — BP 164/103 | HR 98 | Temp 98.6°F

## 2012-03-28 DIAGNOSIS — E119 Type 2 diabetes mellitus without complications: Secondary | ICD-10-CM | POA: Diagnosis not present

## 2012-03-28 DIAGNOSIS — I10 Essential (primary) hypertension: Secondary | ICD-10-CM

## 2012-03-28 DIAGNOSIS — I6350 Cerebral infarction due to unspecified occlusion or stenosis of unspecified cerebral artery: Secondary | ICD-10-CM | POA: Diagnosis not present

## 2012-03-28 DIAGNOSIS — I63219 Cerebral infarction due to unspecified occlusion or stenosis of unspecified vertebral arteries: Secondary | ICD-10-CM

## 2012-03-28 NOTE — Patient Instructions (Signed)
Foot looks good will refer to PT and also get diabetic shoes.

## 2012-03-28 NOTE — Progress Notes (Signed)
Subjective:   Patient ID: Patrick [name] male   DOB: July 08, 1946 66 y.o.   MRN: [id number]  HPI: Patrick [name] is a 66 y.o. man who presents for follow up from his last appointment.    He states that his foot is feeling better but that it still hurts when he puts more pressure on it.  He denies any redness or increased swelling.  His wife is with him today and states that he continues to drag his right foot when he walks.  She states that he has been doing that since his stroke and that it has not gotten better or worse lately.  He denies numbness, tingling in his feet, weakness, or falls.  He does not use an assistive device to walk.    He states that he is taking his insulin more consistently then previously.  He does admit that he misses a day maybe once every other week but on the most part is taking it. He denies changes in his vision, polyuria, or polydipsia.    He states that he is taking his blood pressure medications as prescribed and that he thinks his blood pressure is high today because he ate bacon for breakfast this morning.  He denies increased swelling in his legs, headache, or chest pain.    Past Medical History  Diagnosis Date  . Diabetes mellitus type II     Insulin dependent  . Hypertension   . Sleep apnea 07/2010  . CKD (chronic kidney disease), stage II   . Paroxysmal atrial fibrillation     on coumadin  . CAD (coronary artery disease)   . Depression   . Gout   . Hyperlipidemia   . 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 aspirin 81 MG EC tablet Take 81 mg by mouth daily.        . benazepril (LOTENSIN) 40 MG tablet Take 1 tablet (40 mg total) by mouth daily.  62 tablet  5  . Blood Glucose Monitoring Suppl (ACCU-CHEK AVIVA PLUS) W/DEVICE KIT 1 each by Does not apply route as needed.  1 kit  0  . chlorthalidone (HYGROTON) 25 MG tablet Take 1 tablet  (25 mg total) by mouth daily.  90 tablet  4  . cloNIDine (CATAPRES) 0.3 MG tablet Take 1 tablet (0.3 mg total) by mouth 2 (two) times daily.  60 tablet  3  . diltiazem (CARDIZEM) 90 MG tablet Take 1 tablet (90 mg total) by mouth 2 (two) times daily.  180 tablet  4  . famotidine (PEPCID) 20 MG tablet Take 1 tablet (20 mg total) by mouth 2 (two) times daily as needed for heartburn.  62 tablet  5  . glipiZIDE (GLUCOTROL) 10 MG tablet Take 1.5 tablets (15 mg total) by mouth 2 (two) times daily before a meal.  180 tablet  3  . glucose blood (TRUETRACK TEST) test strip Use to test blood sugar 2-3 times daily  100 each  11  . insulin NPH (NOVOLIN N) 100 UNIT/ML injection Inject 25 Units into the skin at bedtime.  10 mL  6  . Insulin Syringe-Needle U-100 (ELITE-THIN INS SYR .5CC/31G) 31G X 5/16" 0.5 ML MISC 1 each by Does not apply route 3 (three) times daily. Use to inject insulin daily  100 each  11  . Lancets 30G MISC 1 each by Does not apply route 3 (three)  times daily. Use to test blood glucose 2-3 times daily  100 each  11  . Omega-3 Fatty Acids (FISH OIL) 1000 MG CAPS Take 1 capsule by mouth 2 (two) times daily.        . potassium chloride (MICRO-K) 10 MEQ CR capsule Take 1 capsule (10 mEq total) by mouth 2 (two) times daily.  30 capsule  0  . pravastatin (PRAVACHOL) 40 MG tablet Take 2 tablets (80 mg total) by mouth daily. TAKE 2 TABLETS BY MOUTH EVERYDAY.  60 tablet  6  . predniSONE (DELTASONE) 20 MG tablet Take 1 tablet (20 mg total) by mouth daily. Take two tablets by mouth  Daily for 5 days, then one tablet by mouth daily for 5 days. Then stop.  15 tablet  0  . warfarin (COUMADIN) 5 MG tablet Take 1 tablet (5 mg total) by mouth as directed.  115 tablet  1   No family history on file. History   Social History  . Marital Status: Married    [name]    Number of Children: N/A  . Years of Education: N/A   Social History Main Topics  . Smoking status: Former Smoker    Types:  Cigarettes    Quit date: 10/26/1976  . Smokeless tobacco: Never Used  . Alcohol Use: No  . Drug Use: No  . Sexually Active: Not on file   Other Topics Concern  . Not on file   Social History Narrative  . No narrative on file   Review of Systems: Constitutional: Denies fever, chills, diaphoresis, appetite change and fatigue.  HEENT: Denies photophobia, eye pain, redness, hearing loss, ear pain, congestion, sore throat, rhinorrhea, sneezing, mouth sores, trouble swallowing, neck pain, neck stiffness and tinnitus.   Respiratory: Denies SOB, DOE, cough, chest tightness,  and wheezing.   Cardiovascular: Denies chest pain, palpitations and leg swelling.  Gastrointestinal: Denies nausea, vomiting, abdominal pain, diarrhea, constipation, blood in stool and abdominal distention.  Genitourinary: Denies dysuria, urgency, frequency, hematuria, flank pain and difficulty urinating.  Musculoskeletal: Denies myalgias, back pain, joint swelling, arthralgias and gait problem.  Skin: Denies pallor, rash and wound.  Neurological: Denies dizziness, seizures, syncope, weakness, light-headedness, numbness and headaches.  Hematological: Denies adenopathy. Easy bruising, personal or family bleeding history  Psychiatric/Behavioral: Denies suicidal ideation, mood changes, confusion, nervousness, sleep disturbance and agitation  Objective:  Physical Exam: Filed Vitals:   03/28/12 1525  BP: 164/103  Pulse: 98  Temp: 98.6 F (37 C)  TempSrc: Oral  SpO2: 99%   Constitutional: Vital signs reviewed.  Patient is a well-developed and well-nourished man in no acute distress and cooperative with exam. Alert and oriented x3.  Head: Normocephalic and atraumatic Ear: TM normal bilaterally Mouth: no erythema or exudates, MMM Eyes: PERRL, EOMI, conjunctivae normal, No scleral icterus.  Neck: Supple, Trachea midline normal ROM, No JVD, mass, thyromegaly, or carotid bruit present.  Cardiovascular: irregularly  irregular rate and rhythm.  S1 normal, S2 normal, no MRG, pulses symmetric and intact bilaterally Pulmonary/Chest: CTAB, no wheezes, rales, or rhonchi Abdominal: Soft. Non-tender, non-distended, bowel sounds are normal, no masses, organomegaly, or guarding present.  GU: no CVA tenderness Musculoskeletal: mild TTP over the right MTP.  Gait shows a shuffling gait with his right foot dragging the medial portion of the foot.  No joint deformities, erythema, or stiffness, Hematology: no cervical, inginal, or axillary adenopathy.  Neurological: A&O x3, Strength is normal and symmetric bilaterally, cranial nerve II-XII are grossly intact, no focal motor  deficit, sensory intact to light touch bilaterally.  Skin: Warm, dry and intact. No rash, cyanosis, or clubbing.  Psychiatric: full mood with VERY flat affect. speech is slow but coherent, Behavior is normal. Judgment and insight are poor.  Thought content normal. Cognition is slowed and memory appears normal.   Assessment & Plan:

## 2012-04-04 DIAGNOSIS — N2581 Secondary hyperparathyroidism of renal origin: Secondary | ICD-10-CM | POA: Diagnosis not present

## 2012-04-04 DIAGNOSIS — I129 Hypertensive chronic kidney disease with stage 1 through stage 4 chronic kidney disease, or unspecified chronic kidney disease: Secondary | ICD-10-CM | POA: Diagnosis not present

## 2012-04-04 DIAGNOSIS — E1142 Type 2 diabetes mellitus with diabetic polyneuropathy: Secondary | ICD-10-CM | POA: Diagnosis not present

## 2012-04-04 DIAGNOSIS — N058 Unspecified nephritic syndrome with other morphologic changes: Secondary | ICD-10-CM | POA: Diagnosis not present

## 2012-04-04 DIAGNOSIS — D649 Anemia, unspecified: Secondary | ICD-10-CM | POA: Diagnosis not present

## 2012-04-07 ENCOUNTER — Other Ambulatory Visit: Payer: Self-pay | Admitting: *Deleted

## 2012-04-07 DIAGNOSIS — E119 Type 2 diabetes mellitus without complications: Secondary | ICD-10-CM

## 2012-04-07 MED ORDER — INSULIN NPH (HUMAN) (ISOPHANE) 100 UNIT/ML ~~LOC~~ SUSP: 25.0000 [IU] | Freq: Every day | SUBCUTANEOUS | Status: DC

## 2012-04-08 ENCOUNTER — Other Ambulatory Visit: Payer: Self-pay | Admitting: *Deleted

## 2012-04-08 DIAGNOSIS — E119 Type 2 diabetes mellitus without complications: Secondary | ICD-10-CM

## 2012-04-11 NOTE — Assessment & Plan Note (Signed)
He needs a refill of his coumadin today which we will do.  He follows with Dr. Elie Confer and is on Coumadin for atrial fibrillation.

## 2012-04-11 NOTE — Assessment & Plan Note (Signed)
Creat (mg/dL)  Date Value  12/14/2011 2.68*  07/26/2011 2.48*  05/10/2011 1.98*  03/07/2011 2.11*  12/11/2010 2.04*  10/27/2010 1.85*     Creatinine, Ser (mg/dL)  Date Value  11/30/2010 2.31*  11/29/2010 2.14*  11/29/2010 2.17*  11/28/2010 2.12*  11/27/2010 1.79*  11/26/2010 1.78*   His creatinine has been risking mildly over the last few years and he is currently at CKD stage III.  He continues on ACE but we are avoiding all other renal toxic medications.  We are working to get him over to Nephrology to help manage and to plug him into the system over there should he eventually need to start preparing for dialysis.

## 2012-04-11 NOTE — Assessment & Plan Note (Signed)
Lab Results  Component Value Date   HGBA1C 9.6 02/29/2012   HGBA1C  Value: 11.8 (NOTE)                                                                       According to the ADA Clinical Practice Recommendations for 2011, when HbA1c is used as a screening test:   >=6.5%   Diagnostic of Diabetes Mellitus           (if abnormal result  is confirmed)  5.7-6.4%   Increased risk of developing Diabetes Mellitus  References:Diagnosis and Classification of Diabetes Mellitus,Diabetes [id number] 1):S62-S69 and Standards of Medical Care in         Diabetes - 2011,Diabetes [id number]  (Suppl 1):S11-S61.* 11/22/2010   CREATININE 2.68* 12/14/2011   CREATININE 2.31* 11/30/2010   MICROALBUR 37.60* 10/27/2010   MICRALBCREAT 313.9* 10/27/2010   CHOL 136 12/14/2011   HDL 54 12/14/2011   TRIG 107 12/14/2011    Last eye exam and foot exam:    Component Value Date/Time   HMDIABEYEEXA no diabetic retinopathy 04/10/2010   HMDIABFOOTEX done 07/07/2010    Assessment: Diabetes control: not controlled Progress toward goals: improved Barriers to meeting goals: nonadherence to medications and lack of understanding of disease management  Plan: Diabetes treatment: continue current medications We discussed that his A1C is moving in the right direction and we need to continue on the current course.  I congratulated him on remembering to take his insulin every day.  We also discussed that when Open enrollment starts that they should seriously consider a supplement plan to their Medicare to help pay for medications.  Refer to: none Instruction/counseling given: reminded to get eye exam

## 2012-04-11 NOTE — Assessment & Plan Note (Signed)
BP Readings from Last 4 Encounters:  02/29/12 142/92  12/28/11 133/83  12/14/11 177/93  11/26/11 190/80   Blood pressure today is mildly elevated but is considerably better then it has been.  I suspect greatly that this is due to better medication compliance.  We also discussed making sure that he continues to use his CPAP to help him rest better at night and that treatment of his OSA will help his blood pressure, cholesterol, and his diabetes.

## 2012-04-21 ENCOUNTER — Ambulatory Visit: Payer: Medicare Other | Attending: Internal Medicine | Admitting: Physical Therapy

## 2012-04-21 DIAGNOSIS — IMO0001 Reserved for inherently not codable concepts without codable children: Secondary | ICD-10-CM | POA: Diagnosis not present

## 2012-04-21 DIAGNOSIS — I69998 Other sequelae following unspecified cerebrovascular disease: Secondary | ICD-10-CM | POA: Diagnosis not present

## 2012-04-21 DIAGNOSIS — R269 Unspecified abnormalities of gait and mobility: Secondary | ICD-10-CM | POA: Insufficient documentation

## 2012-04-30 ENCOUNTER — Telehealth: Payer: Self-pay | Admitting: Dietician

## 2012-04-30 DIAGNOSIS — E119 Type 2 diabetes mellitus without complications: Secondary | ICD-10-CM

## 2012-04-30 MED ORDER — LANCETS 30G MISC: Status: DC

## 2012-04-30 MED ORDER — GLUCOSE BLOOD VI STRP: ORAL_STRIP | Status: DC

## 2012-04-30 NOTE — Telephone Encounter (Signed)
Called to follow up on health maintenance items due:  Eye exam- he wants to call dr. Coral Else office to ask about price of visit Flu- he does not want to do anything yet about this Out of strips-insurance did not cover (? Due to no dx code ) will request Rx for 2-3 x a day testing  per pt request sent to [facility]  Dr. Dahlia Byes- Medicare now requires [name] to print the Rx and fax it with your signature.  Please forward to triage refill nurse to fax it. Thank you!

## 2012-04-30 NOTE — Telephone Encounter (Signed)
I would assume that this is for the strips.  I did the prescription and placed it in the after hours box in the med room.

## 2012-05-01 NOTE — Addendum Note (Signed)
Addended by: Gevena Cotton A on: 05/01/2012 10:57 AM   Modules accepted: Orders

## 2012-05-01 NOTE — Telephone Encounter (Signed)
Rx faxed to pharmacy  

## 2012-05-05 NOTE — Assessment & Plan Note (Signed)
He continues to struggle with the after effects of his stroke.  With his gait problems we will work to try to get him back to rehab to see if we can help him get stronger.  He also will need a new pair of diabetic shoes.

## 2012-05-05 NOTE — Assessment & Plan Note (Signed)
Lab Results  Component Value Date   HGBA1C 9.6 02/29/2012   HGBA1C 11.2 12/14/2011   HGBA1C 10.8 10/12/2011   Lab Results  Component Value Date   MICROALBUR 37.60* 10/27/2010   LDLCALC 61 12/14/2011   CREATININE 2.68* 12/14/2011   His A1C is better likely due to taking his medication more consistently.  We will continue to monitor and need to consider mealtime insulin if we still do not get him to goal with just his novolin.  We will hit a snag with the cost of his medications.  I encouraged both he and his wife to look into medicare supplement plans to help with medication costs when open enrollment starts in November.

## 2012-05-05 NOTE — Assessment & Plan Note (Signed)
Lab Results  Component Value Date   NA 143 12/14/2011   K 3.5 12/14/2011   CL 102 12/14/2011   CO2 31 12/14/2011   BUN 38* 12/14/2011   CREATININE 2.68* 12/14/2011   CREATININE 2.31* 11/30/2010    BP Readings from Last 3 Encounters:  03/28/12 164/103  03/17/12 174/90  02/29/12 142/92    Assessment: Hypertension control:  moderately elevated  Progress toward goals:  improved Barriers to meeting goals:  nonadherence to medications and lack of understanding of disease management  Plan: Hypertension treatment:  continue current medicationsHis blood pressure has been good when he takes his medications and uses his CPAP.  He still does not watch his diet very closely which also could be contributing to his elevated pressure today.  I encouraged him to take his medications as prescribed and encouraged his wife to help him to remember his medications.  We will continue to monitor.

## 2012-05-12 ENCOUNTER — Ambulatory Visit (INDEPENDENT_AMBULATORY_CARE_PROVIDER_SITE_OTHER): Payer: Medicare Other | Admitting: Pharmacist

## 2012-05-12 DIAGNOSIS — Z7901 Long term (current) use of anticoagulants: Secondary | ICD-10-CM

## 2012-05-12 DIAGNOSIS — I4891 Unspecified atrial fibrillation: Secondary | ICD-10-CM | POA: Diagnosis not present

## 2012-05-12 MED ORDER — WARFARIN SODIUM 5 MG PO TABS: ORAL_TABLET | ORAL | Status: DC

## 2012-05-12 NOTE — Progress Notes (Signed)
Agree with Dr. Gladstone Pih Assessment/plan for this patient.

## 2012-05-12 NOTE — Progress Notes (Signed)
Anti-Coagulation Progress Note  Patrick [name] is a 66 y.o. male who is currently on an anti-coagulation regimen.    RECENT RESULTS: Recent results are below, the most recent result is correlated with a dose of 25 mg. per week: Lab Results  Component Value Date   INR 2.30 05/12/2012   INR 5.3 03/17/2012   INR 2.50 01/28/2012    ANTI-COAG DOSE:   Latest dosing instructions   Total Sun Mon Tue Wed Thu Fri Sat   25 2.5 mg 5 mg 2.5 mg 5 mg 2.5 mg 5 mg 2.5 mg    (5 mg0.5) (5 mg1) (5 mg0.5) (5 mg1) (5 mg0.5) (5 mg1) (5 mg0.5)         ANTICOAG SUMMARY: Anticoagulation Episode Summary              Current INR goal 2.0-3.0 Next INR check 06/09/2012   INR from last check 2.30 (05/12/2012)     Weekly max dose (mg)  Target end date Indefinite   Indications ATRIAL FIBRILLATION, PAROXYSMAL, CHRONIC, Long term current use of anticoagulant   INR check location  Preferred lab    Send INR reminders to ANTICOAG IMP   Comments        Provider Role Specialty Phone number   Bartholomew Crews, MD  Internal Medicine [phone]        ANTICOAG TODAY: Anticoagulation Summary as of 05/12/2012              INR goal 2.0-3.0     Selected INR 2.30 (05/12/2012) Next INR check 06/09/2012   Weekly max dose (mg)  Target end date Indefinite   Indications ATRIAL FIBRILLATION, PAROXYSMAL, CHRONIC, Long term current use of anticoagulant    Anticoagulation Episode Summary              INR check location  Preferred lab    Send INR reminders to ANTICOAG IMP   Comments        Provider Role Specialty Phone number   Bartholomew Crews, MD  Internal Medicine [phone]        PATIENT INSTRUCTIONS: Patient Instructions  Patient instructed to take medications as defined in the Anti-coagulation Track section of this encounter.  Patient instructed to take today's dose.  Patient verbalized understanding of these instructions.        FOLLOW-UP Return in 4 weeks (on 06/09/2012) for Follow up  INR at 3:30PM.  [name], III Pharm.D., CACP

## 2012-05-12 NOTE — Patient Instructions (Signed)
Patient instructed to take medications as defined in the Anti-coagulation Track section of this encounter.  Patient instructed to take today's dose.  Patient verbalized understanding of these instructions.    

## 2012-05-12 NOTE — Addendum Note (Signed)
Addended by: [name] B on: 05/12/2012 04:52 PM   Modules accepted: Orders

## 2012-05-15 DIAGNOSIS — D649 Anemia, unspecified: Secondary | ICD-10-CM | POA: Diagnosis not present

## 2012-05-15 DIAGNOSIS — I120 Hypertensive chronic kidney disease with stage 5 chronic kidney disease or end stage renal disease: Secondary | ICD-10-CM | POA: Diagnosis not present

## 2012-05-15 DIAGNOSIS — I10 Essential (primary) hypertension: Secondary | ICD-10-CM | POA: Diagnosis not present

## 2012-05-15 DIAGNOSIS — N2581 Secondary hyperparathyroidism of renal origin: Secondary | ICD-10-CM | POA: Diagnosis not present

## 2012-05-15 DIAGNOSIS — N184 Chronic kidney disease, stage 4 (severe): Secondary | ICD-10-CM | POA: Diagnosis not present

## 2012-05-27 ENCOUNTER — Other Ambulatory Visit: Payer: Self-pay | Admitting: *Deleted

## 2012-05-27 DIAGNOSIS — M109 Gout, unspecified: Secondary | ICD-10-CM

## 2012-05-27 MED ORDER — PREDNISONE 20 MG PO TABS: 20.0000 mg | ORAL_TABLET | Freq: Every day | ORAL | Status: DC

## 2012-05-27 NOTE — Telephone Encounter (Signed)
Pt states he has gout in R foot, he does not know when he might be able to come for appt, his wife works but he will speak w/ her tonight and try to make arrangements

## 2012-05-27 NOTE — Telephone Encounter (Signed)
I will refill this for him since I know that he has actual gout flares and steroids are the only thing that he can take because of his creatinine.  He needs to be seen so he can get started on something prophylactic like renally dosed Allopurinol or Colchicine.

## 2012-05-28 ENCOUNTER — Ambulatory Visit (INDEPENDENT_AMBULATORY_CARE_PROVIDER_SITE_OTHER): Payer: Medicare Other | Admitting: Internal Medicine

## 2012-05-28 ENCOUNTER — Encounter: Payer: Self-pay | Admitting: Internal Medicine

## 2012-05-28 VITALS — BP 195/139 | HR 70 | Temp 98.3°F | Wt 208.8 lb

## 2012-05-28 DIAGNOSIS — E119 Type 2 diabetes mellitus without complications: Secondary | ICD-10-CM | POA: Diagnosis not present

## 2012-05-28 DIAGNOSIS — I10 Essential (primary) hypertension: Secondary | ICD-10-CM | POA: Diagnosis not present

## 2012-05-28 DIAGNOSIS — M109 Gout, unspecified: Secondary | ICD-10-CM

## 2012-05-28 LAB — POCT GLYCOSYLATED HEMOGLOBIN (HGB A1C): Hemoglobin A1C: 7.9

## 2012-05-28 LAB — GLUCOSE, CAPILLARY: Glucose-Capillary: 75 mg/dL (ref 70–99)

## 2012-05-28 NOTE — Telephone Encounter (Signed)
Called to pharm, tried to call pt 2 times today, added note to script for appt

## 2012-05-28 NOTE — Progress Notes (Signed)
  Subjective:    Patient ID: Patrick [name], male    DOB: 09/15/1945, 66 y.o.   MRN: [id number]  HPI  Presents to clinic for acute gout flare of right foot.  Pt states that this started ~ 3 days and is improving somewhat.  His PCP Dr. Obie Dredge was notified and prescription for prednisone was called to pts pharmacy as he cannot take NSAIDS secondary to elevated creatine levels.  Pt seemed unaware that prescription was available.  He vaguely remembers discussion with PCP concerning starting allopurinol therapy for recurrent gout flares.  He has not taken hi medications today thus bp is 198/139 and 185/139 on repeat.  He does have his bag of medications with him but not all of the prescribed medications are in the bag. Benazapril and chlorthalidone were not in the bag.  He was given his clonidine 0.3 mg and diltiazem 90 mg in the office today.  He is with a male family member who ensured me that the prednisone will be picked up and pt will take the rest of his home meds.  Detailed instructions on the AVS were given to prevent double dosing of clonidine and diltiazem. He will f/u in 1 week with PCP for bp recheck and further assessment for renally dosed allopurinol therapy.  Review of Systems  Constitutional: Negative for fever and fatigue.  Respiratory: Negative for shortness of breath.   Cardiovascular: Negative for chest pain.  Musculoskeletal: Positive for joint swelling and arthralgias.  Neurological: Negative for numbness.       Objective:   Physical Exam  Constitutional: He is oriented to person, place, and time. He appears well-developed and well-nourished. No distress.       Sitting in wheelchair  HENT:  Head: Normocephalic and atraumatic.  Cardiovascular: Normal rate, regular rhythm and normal heart sounds.   Pulmonary/Chest: Effort normal and breath sounds normal.  Musculoskeletal: He exhibits tenderness. He exhibits no edema.       Right foot: He exhibits tenderness. He exhibits no  swelling and normal capillary refill.       Feet:  Neurological: He is alert and oriented to person, place, and time.  Psychiatric: His speech is delayed. He is slowed.          Assessment & Plan:  1. Acute gout flare: resolving, will defer aspiration given that symptoms started at least 3 days ago and pain is improving without intervention thus septic joint less likely and damaged would already be done to the joint past the 48 hour mark. -pt to fill prior prednisone prescription per PCP -f/u 1 week and re-address need for renally dosed allopurinol vs Uloric if Medicare will pay  2. Hypertension: above goal today secondary to pt not taking bp meds, clonidine and diltiazem given in clinic -pt to f/u in 1 week for bp check  3. Dispo: may want to consider if pt is eligible for services ie [facility], etc., he doesn't appear to have much insight into his multiple medical conditions -consider SW consult at follow-up, will defer to PCP

## 2012-05-28 NOTE — Patient Instructions (Signed)
Dr. Obie Dredge has called in prednisone to your pharmacy for your gout flare. He will discuss with you about starting allopurinol or another medication to control your gout at your next visit with him. Your blood pressure is very high today because you have not taken your medications. We have given you the clonidine and diltiazem in the clinic because they help your pressure and were the only blood pressure medicines in your medicine bag today. When you return home, be sure to take the benazepril and chlorthalidone for your blood pressure as well as the rest of your medications. You do not have to take the clonidine or diltiazem until this evening at your regular time. Follow-up with Dr. Obie Dredge in 1 week.

## 2012-05-28 NOTE — Telephone Encounter (Signed)
Pt seen by Dr Michail Sermon; Prednisone 20mg  - take 2 tabs x 5 days the 2 tab x 5days the stop called to [facility] per Dr Obie Dredge rx .

## 2012-06-09 ENCOUNTER — Ambulatory Visit (INDEPENDENT_AMBULATORY_CARE_PROVIDER_SITE_OTHER): Payer: Medicare Other | Admitting: Pharmacist

## 2012-06-09 DIAGNOSIS — Z7901 Long term (current) use of anticoagulants: Secondary | ICD-10-CM | POA: Diagnosis not present

## 2012-06-09 DIAGNOSIS — I4891 Unspecified atrial fibrillation: Secondary | ICD-10-CM | POA: Diagnosis not present

## 2012-06-09 LAB — POCT INR: INR: 2.7

## 2012-06-09 NOTE — Patient Instructions (Signed)
Patient instructed to take medications as defined in the Anti-coagulation Track section of this encounter.  Patient instructed to take today's dose.  Patient verbalized understanding of these instructions.    

## 2012-06-09 NOTE — Progress Notes (Signed)
Anti-Coagulation Progress Note  Patrick [name] is a 66 y.o. male who is currently on an anti-coagulation regimen.    RECENT RESULTS: Recent results are below, the most recent result is correlated with a dose of 25 mg. per week: Lab Results  Component Value Date   INR 2.70 06/09/2012   INR 2.30 05/12/2012   INR 5.3 03/17/2012    ANTI-COAG DOSE:   Latest dosing instructions   Total Sun Mon Tue Wed Thu Fri Sat   25 2.5 mg 5 mg 2.5 mg 5 mg 2.5 mg 5 mg 2.5 mg    (5 mg0.5) (5 mg1) (5 mg0.5) (5 mg1) (5 mg0.5) (5 mg1) (5 mg0.5)         ANTICOAG SUMMARY: Anticoagulation Episode Summary              Current INR goal 2.0-3.0 Next INR check 07/14/2012   INR from last check 2.70 (06/09/2012)     Weekly max dose (mg)  Target end date Indefinite   Indications ATRIAL FIBRILLATION, PAROXYSMAL, CHRONIC [427.31], Long term current use of anticoagulant [V58.61]   INR check location  Preferred lab    Send INR reminders to ANTICOAG IMP   Comments        Provider Role Specialty Phone number   Bartholomew Crews, MD  Internal Medicine [phone]        ANTICOAG TODAY: Anticoagulation Summary as of 06/09/2012              INR goal 2.0-3.0     Selected INR 2.70 (06/09/2012) Next INR check 07/14/2012   Weekly max dose (mg)  Target end date Indefinite   Indications ATRIAL FIBRILLATION, PAROXYSMAL, CHRONIC [427.31], Long term current use of anticoagulant [V58.61]    Anticoagulation Episode Summary              INR check location  Preferred lab    Send INR reminders to ANTICOAG IMP   Comments        Provider Role Specialty Phone number   Bartholomew Crews, MD  Internal Medicine [phone]        PATIENT INSTRUCTIONS: Patient Instructions  Patient instructed to take medications as defined in the Anti-coagulation Track section of this encounter.  Patient instructed to take today's dose.  Patient verbalized understanding of these instructions.        FOLLOW-UP Return in  5 weeks (on 07/14/2012) for Follow up INR at [place], III Pharm.D., CACP

## 2012-06-11 ENCOUNTER — Ambulatory Visit: Payer: Medicare Other | Admitting: Internal Medicine

## 2012-06-24 ENCOUNTER — Telehealth: Payer: Self-pay | Admitting: *Deleted

## 2012-06-24 DIAGNOSIS — Z9114 Patient's other noncompliance with medication regimen: Secondary | ICD-10-CM

## 2012-06-24 DIAGNOSIS — M109 Gout, unspecified: Secondary | ICD-10-CM

## 2012-06-24 NOTE — Telephone Encounter (Signed)
Pt came by clinic asking for refill on prednisone for pain in rt foot.  Onset of pain 2 days ago.   No swelling  but is painful per pt. He was asked to make an appointment for next week for evaluation.  He can not come in tomorrow. Will you refill or give him a pain med?

## 2012-06-25 ENCOUNTER — Other Ambulatory Visit: Payer: Self-pay | Admitting: Internal Medicine

## 2012-06-25 DIAGNOSIS — M109 Gout, unspecified: Secondary | ICD-10-CM

## 2012-06-25 DIAGNOSIS — Z9114 Patient's other noncompliance with medication regimen: Secondary | ICD-10-CM | POA: Insufficient documentation

## 2012-06-25 MED ORDER — PREDNISONE 20 MG PO TABS: ORAL_TABLET | ORAL | Status: DC

## 2012-06-25 MED ORDER — PREDNISONE 20 MG PO TABS: 20.0000 mg | ORAL_TABLET | Freq: Every day | ORAL | Status: DC

## 2012-06-25 NOTE — Telephone Encounter (Signed)
I will call it in but he has to keep his appointment with Dr. Para Skeans for the 6th.  He has had at least 4 flares if not more over this last year and needs to be started on allopurinol.  With his chronic kidney disease he should start at 100 mg daily for 1 week then increase to 200 mg daily until we can recheck his uric acid levels.  I will forward this note to Dr. Para Skeans so he can address this at the visit on 12/6.  Also I'm going to put through a social work consult to see if Patrick [name] qualifies for [facility] services.  His medication compliance has been an on going problem for him since I started seeing him.

## 2012-06-25 NOTE — Telephone Encounter (Signed)
Rx called in 

## 2012-06-30 ENCOUNTER — Telehealth: Payer: Self-pay | Admitting: Licensed Clinical Social Worker

## 2012-06-30 NOTE — Telephone Encounter (Signed)
Patrick [name] was referred to CSW for referral to [facility] for medication assistance and information on open enrollment for Part D.  CSW placed call to discuss referral to [facility], informed Patrick [name] of benefits of [facility] and to encourage contact with agency.  CSW provided Patrick [name] to the phone number to [facility] and encourage pt to call today as there is the last week for Medicare Part D open enrollment.  Pt took down phone number.  Pt denies add'l needs at this time.  CSW will sign off.  Referral to [facility] faxed.

## 2012-07-04 ENCOUNTER — Encounter: Payer: Self-pay | Admitting: Radiation Oncology

## 2012-07-04 ENCOUNTER — Ambulatory Visit (INDEPENDENT_AMBULATORY_CARE_PROVIDER_SITE_OTHER): Payer: Medicare Other | Admitting: Radiation Oncology

## 2012-07-04 VITALS — BP 180/110 | HR 74 | Temp 98.3°F | Ht 69.0 in | Wt 214.4 lb

## 2012-07-04 DIAGNOSIS — M109 Gout, unspecified: Secondary | ICD-10-CM

## 2012-07-04 DIAGNOSIS — N183 Chronic kidney disease, stage 3 unspecified: Secondary | ICD-10-CM | POA: Diagnosis not present

## 2012-07-04 DIAGNOSIS — I10 Essential (primary) hypertension: Secondary | ICD-10-CM | POA: Diagnosis not present

## 2012-07-04 LAB — BASIC METABOLIC PANEL
Potassium: 3.8 mEq/L (ref 3.5–5.3)
Sodium: 146 mEq/L — ABNORMAL HIGH (ref 135–145)

## 2012-07-04 MED ORDER — ALLOPURINOL 100 MG PO TABS: ORAL_TABLET | ORAL | Status: DC

## 2012-07-04 NOTE — Progress Notes (Signed)
  Subjective:    Patient ID: Patrick [name], male    DOB: 09-28-1945, 66 y.o.   MRN: [id number]  HPI Pt presents to clinic to be initiated on allopurinol therapy for gout. Pt states that his most recent gout flare was 2 wks prior, for which he was prescribed prednisone. He denies any gout symptoms today, such as joint redness or swelling. He states he feels well and denies any complaints. Pt is also requesting a refill of several of his medications.   Review of Systems  Constitutional: Negative for fever and chills.  HENT: Negative.   Respiratory: Negative for cough and shortness of breath.   Cardiovascular: Negative for chest pain and leg swelling.  Gastrointestinal: Negative for nausea, vomiting, abdominal pain and diarrhea.  Genitourinary: Negative.   Musculoskeletal: Negative for joint swelling and arthralgias.  Skin: Negative for color change, rash and wound.  Neurological: Negative.   Hematological: Negative.   Psychiatric/Behavioral: Negative.        Objective:   Physical Exam  Constitutional: He is oriented to person, place, and time. He appears well-developed and well-nourished. No distress.  HENT:  Head: Normocephalic and atraumatic.  Eyes: Conjunctivae normal are normal. Pupils are equal, round, and reactive to light. No scleral icterus.  Neck: Normal range of motion. Neck supple. No tracheal deviation present. No thyromegaly present.  Cardiovascular: Normal rate.  An irregularly irregular rhythm present.  No murmur heard. Pulmonary/Chest: Effort normal and breath sounds normal. He has no wheezes. He has no rales.  Abdominal: Soft. Bowel sounds are normal. He exhibits no distension. There is no tenderness.  Musculoskeletal: Normal range of motion. He exhibits no edema and no tenderness.  Neurological: He is alert and oriented to person, place, and time. No cranial nerve deficit.  Skin: Skin is warm and dry. No erythema.  Psychiatric: He has a normal mood and affect. His  behavior is normal.          Assessment & Plan:

## 2012-07-04 NOTE — Patient Instructions (Addendum)
Gout Gout is caused by a buildup of uric acid crystals in the joints. The crystals make your joints sore. This is like having sand in your joints. Repeat attacks are common. Gout can be treated. HOME CARE   Do not take aspirin for pain.  Only take medicine as told by your doctor.  You may use cold treatments (ice) on painful joints.  Put ice in a plastic bag.  Place a towel between your skin and the bag.  Leave the ice on for 15 to 20 minutes at a time, 3 to 4 times a day.  Rest in bed as much as possible. When in bed, keep the sheets and blankets off your sore joints.  Keep the sore joints raised (elevated).  Use crutches if your legs or ankles hurt.  Drink enough water and fluids to keep your pee (urine) clear or pale yellow. This helps your body get rid of uric acid. Do not drink alcohol.  Follow diet instructions as told by your doctor.  Keep your body at a healthy weight. GET HELP RIGHT AWAY IF:   You have a temperature by mouth above 102 F (38.9 C), not controlled by medicine.  You have watery poop (diarrhea).  You are throwing up (vomiting).  Allopurinol tablets What is this medicine? ALLOPURINOL (al oh PURE i nole) reduces the amount of uric acid the body makes. It is used to treat the symptoms of gout. It is also used to treat or prevent high uric acid levels that occur as a result of certain types of chemotherapy. This medicine may also help patients who frequently have kidney stones. This medicine may be used for other purposes; ask your health care provider or pharmacist if you have questions. What should I tell my health care provider before I take this medicine? They need to know if you have any of these conditions: -kidney or liver disease -an unusual or allergic reaction to allopurinol, other medicines, foods, dyes, or preservatives -pregnant or trying to get pregnant -breast feeding How should I use this medicine? Take this medicine by mouth with a  glass of water. Follow the directions on the prescription label. If this medicine upsets your stomach, take it with food or milk. Take your doses at regular intervals. Do not take your medicine more often than directed. Talk to your pediatrician regarding the use of this medicine in children. Special care may be needed. While this drug may be prescribed for children as young as 6 years for selected conditions, precautions do apply. Overdosage: If you think you have taken too much of this medicine contact a poison control center or emergency room at once. NOTE: This medicine is only for you. Do not share this medicine with others. What if I miss a dose? If you miss a dose, take it as soon as you can. If it is almost time for your next dose, take only that dose. Do not take double or extra doses. What may interact with this medicine? Do not take this medicine with the following medication: -didanosine, ddI This medicine may also interact with the following medications: -amoxicillin or ampicillin -azathioprine -certain medicines used to treat gout -certain types of diuretics -chlorpropamide -cyclosporine -dicumarol -mercaptopurine -tolbutamide -warfarin This list may not describe all possible interactions. Give your health care provider a list of all the medicines, herbs, non-prescription drugs, or dietary supplements you use. Also tell them if you smoke, drink alcohol, or use illegal drugs. Some items may interact with your  medicine. What should I watch for while using this medicine? Visit your doctor or health care professional for regular checks on your progress. If you are taking this medicine to treat gout, you may not have less frequent attacks at first. Keep taking your medicine regularly and the attacks should get better within 2 to 6 weeks. Drink plenty of water (10 to 12 full glasses a day) while you are taking this medicine. This will help to reduce stomach upset and reduce the risk of  getting gout or kidney stones. Call your doctor or health care professional at once if you get a skin rash together with chills, fever, sore throat, or nausea and vomiting, if you have blood in your urine, or difficulty passing urine. Do not take vitamin C without asking your doctor or health care professional. Too much vitamin C can increase the chance of getting kidney stones. You may get drowsy or dizzy. Do not drive, use machinery, or do anything that needs mental alertness until you know how this drug affects you. Do not stand or sit up quickly, especially if you are an older patient. This reduces the risk of dizzy or fainting spells. Alcohol can make you more drowsy and dizzy. Alcohol can also increase the chance of stomach problems and increase the amount of uric acid in your blood. Avoid alcoholic drinks. What side effects may I notice from receiving this medicine? Side effects that you should report to your doctor or health care professional as soon as possible: -allergic reactions like skin rash, itching or hives, swelling of the face, lips, or tongue -breathing problems -muscle aches or pains -redness, blistering, peeling or loosening of the skin, including inside the mouth Side effects that usually do not require medical attention (report to your doctor or health care professional if they continue or are bothersome): -changes in taste -diarrhea -indigestion -stomach pain or cramps This list may not describe all possible side effects. Call your doctor for medical advice about side effects. You may report side effects to FDA at 1-800-FDA-1088. Where should I keep my medicine? Keep out of the reach of children. Store at room temperature between 15 and 25 degrees C (59 and 77 degrees F). Protect from light and moisture. Throw away any unused medicine after the expiration date. NOTE: This sheet is a summary. It may not cover all possible information. If you have questions about this  medicine, talk to your doctor, pharmacist, or health care provider.  2012, Elsevier/Gold Standard. (01/19/2008 2:26:54 PM)  You do not feel better in 1 day, or you are getting worse.  Your joint hurts more.  You have the chills. MAKE SURE YOU:   Understand these instructions.  Will watch your condition.  Will get help right away if you are not doing well or get worse. Document Released: 04/24/2008 Document Revised: 10/08/2011 Document Reviewed: 10/24/2009 [facility] Patient Information 2013 [place].

## 2012-07-05 NOTE — Assessment & Plan Note (Addendum)
Pt has no signs/symptoms of an acute gout flare today, and is thus appropriate to be initiated on allopurinol at this time to reduce recurrent episodes. Patient has CKD, so allopurinol will need to be titrated up slowly and pt will need close f/u.  - allopurinol 100mg  daily x 1 week, then 200mg  daily - f/u in 1 month and recheck BMET - defer further management (future dose titration, etc.) to Dr. Obie Dredge, pt's PCP  Lab Results  Component Value Date   CREATININE 2.27* 07/04/2012

## 2012-07-05 NOTE — Assessment & Plan Note (Signed)
BP Readings from Last 3 Encounters:  07/04/12 180/110  05/28/12 195/139  03/28/12 164/103    Lab Results  Component Value Date   NA 146* 07/04/2012   K 3.8 07/04/2012   CREATININE 2.27* 07/04/2012    Assessment:  Blood pressure control: moderately elevated  Progress toward BP goal:  deteriorated  Comments: pt has been taking his clonidine QD versus q12h as prescribed. Pt was educated extensively regarding the importance of taking this medication as prescribed to avoid rebound HTN.  Plan:  Medications:  continue current medications  Educational resources provided:    Self management tools provided:    Other plans: continue current medications, which includes taking clonidine q12h as prescribed.

## 2012-07-05 NOTE — Assessment & Plan Note (Addendum)
Lab Results  Component Value Date   CREATININE 2.27* 07/04/2012    Pt states he has been able to establish care with a nephrologist. CKD relatively stable over ~73yr. - repeat BMET in 1 month as pt starting allopurinol (see "gout")

## 2012-07-08 NOTE — Progress Notes (Signed)
I discussed the care of this patient with Dr. Para Skeans and I agree with this note. Thank you for documenting the visit for me. Thanks [employer]

## 2012-07-08 NOTE — Progress Notes (Signed)
I discussed the plan of care of this patient with Dr. Para Skeans and I agree with the documented note. Thank you for working with me on this case.

## 2012-07-14 ENCOUNTER — Ambulatory Visit (INDEPENDENT_AMBULATORY_CARE_PROVIDER_SITE_OTHER): Payer: Medicare Other | Admitting: Pharmacist

## 2012-07-14 DIAGNOSIS — Z7901 Long term (current) use of anticoagulants: Secondary | ICD-10-CM

## 2012-07-14 DIAGNOSIS — I4891 Unspecified atrial fibrillation: Secondary | ICD-10-CM | POA: Diagnosis not present

## 2012-07-14 LAB — POCT INR: INR: 2.4

## 2012-07-14 NOTE — Progress Notes (Signed)
Anti-Coagulation Progress Note  Patrick [name] is a 66 y.o. male who is currently on an anti-coagulation regimen.    RECENT RESULTS: Recent results are below, the most recent result is correlated with a dose of 25 mg. per week: Lab Results  Component Value Date   INR 2.4 07/14/2012   INR 2.70 06/09/2012   INR 2.30 05/12/2012    ANTI-COAG DOSE:   Latest dosing instructions   Total Sun Mon Tue Wed Thu Fri Sat   25 2.5 mg 5 mg 2.5 mg 5 mg 2.5 mg 5 mg 2.5 mg    (5 mg0.5) (5 mg1) (5 mg0.5) (5 mg1) (5 mg0.5) (5 mg1) (5 mg0.5)         ANTICOAG SUMMARY: Anticoagulation Episode Summary              Current INR goal 2.0-3.0 Next INR check 08/25/2012   INR from last check 2.4 (07/14/2012)     Weekly max dose (mg)  Target end date Indefinite   Indications ATRIAL FIBRILLATION, PAROXYSMAL, CHRONIC [427.31], Long term current use of anticoagulant [V58.61]   INR check location  Preferred lab    Send INR reminders to ANTICOAG IMP   Comments        Provider Role Specialty Phone number   Bartholomew Crews, MD  Internal Medicine [phone]        ANTICOAG TODAY: Anticoagulation Summary as of 07/14/2012              INR goal 2.0-3.0     Selected INR 2.4 (07/14/2012) Next INR check 08/25/2012   Weekly max dose (mg)  Target end date Indefinite   Indications ATRIAL FIBRILLATION, PAROXYSMAL, CHRONIC [427.31], Long term current use of anticoagulant [V58.61]    Anticoagulation Episode Summary              INR check location  Preferred lab    Send INR reminders to ANTICOAG IMP   Comments        Provider Role Specialty Phone number   Bartholomew Crews, MD  Internal Medicine [phone]        PATIENT INSTRUCTIONS: Patient Instructions  Patient instructed to take medications as defined in the Anti-coagulation Track section of this encounter.  Patient instructed to take today's dose.  Patient verbalized understanding of these instructions.        FOLLOW-UP Return in 6  weeks (on 08/25/2012) for Follow up INR at 4:30PM.  [name], III Pharm.D., CACP

## 2012-07-14 NOTE — Patient Instructions (Signed)
Patient instructed to take medications as defined in the Anti-coagulation Track section of this encounter.  Patient instructed to take today's dose.  Patient verbalized understanding of these instructions.    

## 2012-08-04 ENCOUNTER — Ambulatory Visit (INDEPENDENT_AMBULATORY_CARE_PROVIDER_SITE_OTHER): Payer: Medicare Other | Admitting: Internal Medicine

## 2012-08-04 ENCOUNTER — Encounter: Payer: Self-pay | Admitting: Internal Medicine

## 2012-08-04 VITALS — BP 187/111 | HR 81 | Temp 97.7°F | Ht 69.0 in | Wt 218.8 lb

## 2012-08-04 DIAGNOSIS — Z23 Encounter for immunization: Secondary | ICD-10-CM | POA: Diagnosis not present

## 2012-08-04 DIAGNOSIS — M109 Gout, unspecified: Secondary | ICD-10-CM | POA: Diagnosis not present

## 2012-08-04 DIAGNOSIS — M3390 Dermatopolymyositis, unspecified, organ involvement unspecified: Secondary | ICD-10-CM

## 2012-08-04 DIAGNOSIS — I10 Essential (primary) hypertension: Secondary | ICD-10-CM

## 2012-08-04 LAB — BASIC METABOLIC PANEL
BUN: 34 mg/dL — ABNORMAL HIGH (ref 6–23)
Chloride: 97 mEq/L (ref 96–112)
Potassium: 3.2 mEq/L — ABNORMAL LOW (ref 3.5–5.3)
Sodium: 139 mEq/L (ref 135–145)

## 2012-08-04 LAB — GLUCOSE, CAPILLARY: Glucose-Capillary: 162 mg/dL — ABNORMAL HIGH (ref 70–99)

## 2012-08-04 NOTE — Progress Notes (Signed)
Subjective:   Patient ID: Patrick [name] male   DOB: 08/28/1945 67 y.o.   MRN: [id number]  HPI: Patrick [name] is a 67 y.o. man with a past medical history of CTD stage II, diabetes, and gout presenting to clinic to followup on starting allopurinol. Patient was started on allopurinol on 07/04/12 and states has not had an attack since that time. Patient is tolerating the medication without side effect. Triggers for gout flares include certain foods that the patient is beginning to identify. The main concern today is that the patient is not taking hypertensive medications as prescribed as the patient seems to lack insight into the importance and scheduled nature of his pills. A previous [facility] referral was made but does not seem that the patient has followed up or have the insight to followup at this time. Patient does not set up a pillbox at home and seems to frequently forget to take medications. Otherwise the patient denied any fever/chills/nausea/vomiting/diarrhea and has no other complaints today.   Past Medical History  Diagnosis Date  . Diabetes mellitus type II     Insulin dependent  . Hypertension   . Sleep apnea 07/2010  . CKD (chronic kidney disease), stage II   . Paroxysmal atrial fibrillation     on coumadin  . CAD (coronary artery disease)   . Depression   . Gout   . Hyperlipidemia   . 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 allopurinol (ZYLOPRIM) 100 MG tablet Take 1 tablet (100mg ) daily for 1 week, then take 2 tablets (200mg ) daily.  30 tablet  1  . aspirin 81 MG EC tablet Take 81 mg by mouth daily.        . benazepril (LOTENSIN) 40 MG tablet Take 1 tablet (40 mg total) by mouth daily.  62 tablet  5  . Blood Glucose Monitoring Suppl (ACCU-CHEK AVIVA PLUS) W/DEVICE KIT 1 each by Does not apply route as needed.  1 kit  0  . chlorthalidone (HYGROTON) 25 MG tablet  Take 1 tablet (25 mg total) by mouth daily.  90 tablet  4  . cloNIDine (CATAPRES) 0.3 MG tablet Take 1 tablet (0.3 mg total) by mouth 2 (two) times daily.  60 tablet  3  . diltiazem (CARDIZEM) 90 MG tablet Take 1 tablet (90 mg total) by mouth 2 (two) times daily.  180 tablet  4  . famotidine (PEPCID) 20 MG tablet Take 1 tablet (20 mg total) by mouth 2 (two) times daily as needed for heartburn.  62 tablet  5  . glipiZIDE (GLUCOTROL) 10 MG tablet Take 1.5 tablets (15 mg total) by mouth 2 (two) times daily before a meal.  180 tablet  3  . glucose blood (TRUETRACK TEST) test strip Use to test blood sugar 2-3 times daily dx code 250.00  100 each  11  . insulin NPH (NOVOLIN N) 100 UNIT/ML injection Inject 25 Units into the skin at bedtime.  10 mL  6  . Insulin Syringe-Needle U-100 (ELITE-THIN INS SYR .5CC/31G) 31G X 5/16" 0.5 ML MISC 1 each by Does not apply route 3 (three) times daily. Use to inject insulin daily  100 each  11  . Lancets 30G MISC Use to test blood glucose 2-3 times daily dx code 250.00  100 each  11  . Omega-3 Fatty Acids (FISH OIL) 1000 MG CAPS Take 1 capsule by mouth  2 (two) times daily.        . potassium chloride (MICRO-K) 10 MEQ CR capsule Take 1 capsule (10 mEq total) by mouth 2 (two) times daily.  30 capsule  0  . pravastatin (PRAVACHOL) 40 MG tablet Take 2 tablets (80 mg total) by mouth daily. TAKE 2 TABLETS BY MOUTH EVERYDAY.  60 tablet  6  . predniSONE (DELTASONE) 20 MG tablet Take two tablets by mouth  Daily for 5 days, then one tablet by mouth daily for 5 days. Then stop.  15 tablet  0  . warfarin (COUMADIN) 5 MG tablet Take as directed by anticoagulation clinic provider.  100 tablet  1   No family history on file. History   Social History  . Marital Status: Married    [name]    Number of Children: N/A  . Years of Education: N/A   Social History Main Topics  . Smoking status: Former Smoker    Types: Cigarettes    Quit date: 10/26/1976  . Smokeless  tobacco: Never Used  . Alcohol Use: No  . Drug Use: No  . Sexually Active: None   Other Topics Concern  . None   Social History Narrative  . None   Review of Systems: otherwise negative unless listed in HPI  Objective:  Physical Exam: Filed Vitals:   08/04/12 1437  BP: 187/111  Pulse: 81  Temp: 97.7 F (36.5 C)  TempSrc: Oral  Height: 5\' 9"  (1.753 m)  Weight: 218 lb 12.8 oz (99.247 kg)  SpO2: 98%   General: NAD, well nourished HEENT: PERRL, EOMI, no scleral icterus Cardiac: RRR, no rubs, murmurs or gallops Pulm: clear to auscultation bilaterally, moving normal volumes of air Abd: soft, nontender, nondistended, BS present Ext: warm and well perfused, no pedal edema Neuro: alert and oriented X3, cranial nerves II-XII grossly intact  Assessment & Plan:  1. Gout: pt states hasn't had attack since started allopurinol and no other side effects. Pt tolerating medication well. Pt has CKD and Cr 2.25 at last visit. -Bmet to asses Cr -refill allopurinol -uric acid serum to titrate (goal would be >6 given no tophi)  2. HTN: pt 187/111 today and stated didn't take medication today. Pt seems to have trouble taking medication and organizing pill box. [facility] referral was made but pt states only initial assessment visit was made. Pt would greatly benefit from [facility] and medication management.  -CSW referral to f/u on connection with services -pt took clonidine and diltiazem during visit -PCP may consider switching pt off clonidine given pts lack of insight and ability to comply   Pt received flu vaccine today.   Pt was discussed with Dr. Lynnae January

## 2012-08-05 LAB — URIC ACID: Uric Acid, Serum: 7 mg/dL (ref 4.0–7.8)

## 2012-08-13 ENCOUNTER — Other Ambulatory Visit: Payer: Self-pay | Admitting: Radiation Oncology

## 2012-08-13 ENCOUNTER — Telehealth: Payer: Self-pay | Admitting: *Deleted

## 2012-08-13 NOTE — Telephone Encounter (Signed)
Did they get Part D for their medicare?  If so do we know what the preferred lower potency statin for them?  I dont' have a problem changing it.  The other option would be to switch to lower dose Atorvastatin which would give [name] the same benefit as the high dose pravastatin that he only has to take one pill as opposed to two which is likely why there was the increase in the copay.

## 2012-08-13 NOTE — Telephone Encounter (Signed)
Pt's wife calls and states pravastatin has gone up 20.00, she ask if it could be changed, i have offered to put them in touch w/ PPA and will do so. Can the pravastatin be changed?

## 2012-08-14 ENCOUNTER — Encounter: Payer: Self-pay | Admitting: *Deleted

## 2012-08-14 NOTE — Progress Notes (Signed)
Creatinine is actually better then his baseline.  Please have him increase his potassium tablets to 2 tablets twice daily and have him came back about a week after increasing to recheck his bmet.  I'll put the order in so all he has to do is have a lab visit and I'll call him.  I'm on nights this week so I wouldn't get a chance to call him til later in the evening

## 2012-08-14 NOTE — Progress Notes (Signed)
Pt's wife came in asking if the f/u lab work was done on [place] to check kidney function.  Pt was seen on 1/6 and bmet was done. Creat 2.06 I noted potassium was 3.2 and not sure if it was addressed. Please advise.

## 2012-08-19 MED ORDER — POTASSIUM CHLORIDE ER 10 MEQ PO CPCR: 10.0000 meq | ORAL_CAPSULE | Freq: Two times a day (BID) | ORAL | Status: DC

## 2012-08-19 NOTE — Progress Notes (Signed)
If Dr Obie Dredge not able to complete K Rx, pls let me know

## 2012-08-19 NOTE — Progress Notes (Signed)
Called pt and wife states pt has been off potassium for months.  He eats a lot of bananas. He will need a new Rx sent in for Kcl.

## 2012-08-19 NOTE — Telephone Encounter (Signed)
Talked with pt's wife and Patrick [name] does NOT have medicare part D. Please make the change.

## 2012-08-19 NOTE — Progress Notes (Signed)
Pt will get meds today and I scheduled lab visit next Tuesday. Family aware.

## 2012-08-19 NOTE — Progress Notes (Signed)
Well that explains why his potassium is lower now.  We will have him restart the 1 tablet twice daily and will still need a recheck in about a week or so after restarting the medication.

## 2012-08-20 NOTE — Progress Notes (Signed)
Note reviewed.  Agree with plan by Dr. Elie Confer for anti-coagulation.  I did not personally see the patient.   Signed  Daryll Drown, EMILY

## 2012-08-25 ENCOUNTER — Ambulatory Visit (INDEPENDENT_AMBULATORY_CARE_PROVIDER_SITE_OTHER): Payer: Medicare Other | Admitting: Pharmacist

## 2012-08-25 ENCOUNTER — Other Ambulatory Visit (INDEPENDENT_AMBULATORY_CARE_PROVIDER_SITE_OTHER): Payer: Medicare Other

## 2012-08-25 DIAGNOSIS — I4891 Unspecified atrial fibrillation: Secondary | ICD-10-CM

## 2012-08-25 DIAGNOSIS — Z7901 Long term (current) use of anticoagulants: Secondary | ICD-10-CM

## 2012-08-25 DIAGNOSIS — N183 Chronic kidney disease, stage 3 unspecified: Secondary | ICD-10-CM | POA: Diagnosis not present

## 2012-08-25 NOTE — Patient Instructions (Signed)
Patient instructed to take medications as defined in the Anti-coagulation Track section of this encounter.  Patient instructed to take today's dose.  Patient verbalized understanding of these instructions.    

## 2012-08-25 NOTE — Progress Notes (Signed)
Anti-Coagulation Progress Note  Patrick [name] is a 67 y.o. male who is currently on an anti-coagulation regimen.    RECENT RESULTS: Recent results are below, the most recent result is correlated with a dose of 25 mg. per week: Lab Results  Component Value Date   INR 2.00 08/25/2012   INR 2.4 07/14/2012   INR 2.70 06/09/2012    ANTI-COAG DOSE:   Latest dosing instructions   Total Sun Mon Tue Wed Thu Fri Sat   27.5 5 mg 2.5 mg 5 mg 2.5 mg 5 mg 2.5 mg 5 mg    (5 mg1) (5 mg0.5) (5 mg1) (5 mg0.5) (5 mg1) (5 mg0.5) (5 mg1)         ANTICOAG SUMMARY: Anticoagulation Episode Summary              Current INR goal 2.0-3.0 Next INR check 09/22/2012   INR from last check 2.00 (08/25/2012)     Weekly max dose (mg)  Target end date Indefinite   Indications ATRIAL FIBRILLATION, PAROXYSMAL, CHRONIC [427.31], Long term current use of anticoagulant [V58.61]   INR check location  Preferred lab    Send INR reminders to ANTICOAG IMP   Comments        Provider Role Specialty Phone number   Bartholomew Crews, MD  Internal Medicine [phone]        ANTICOAG TODAY: Anticoagulation Summary as of 08/25/2012              INR goal 2.0-3.0     Selected INR 2.00 (08/25/2012) Next INR check 09/22/2012   Weekly max dose (mg)  Target end date Indefinite   Indications ATRIAL FIBRILLATION, PAROXYSMAL, CHRONIC [427.31], Long term current use of anticoagulant [V58.61]    Anticoagulation Episode Summary              INR check location  Preferred lab    Send INR reminders to ANTICOAG IMP   Comments        Provider Role Specialty Phone number   Bartholomew Crews, MD  Internal Medicine [phone]        PATIENT INSTRUCTIONS: Patient Instructions  Patient instructed to take medications as defined in the Anti-coagulation Track section of this encounter.  Patient instructed to take today's dose.  Patient verbalized understanding of these instructions.        FOLLOW-UP Return in 4  weeks (on 09/22/2012) for Follow up INR at 2:45PM.  [name], III Pharm.D., CACP

## 2012-08-26 ENCOUNTER — Other Ambulatory Visit: Payer: Medicare Other

## 2012-08-26 LAB — BASIC METABOLIC PANEL
CO2: 30 mEq/L (ref 19–32)
Chloride: 100 mEq/L (ref 96–112)
Glucose, Bld: 257 mg/dL — ABNORMAL HIGH (ref 70–99)
Potassium: 3.5 mEq/L (ref 3.5–5.3)
Sodium: 141 mEq/L (ref 135–145)

## 2012-09-22 ENCOUNTER — Ambulatory Visit (INDEPENDENT_AMBULATORY_CARE_PROVIDER_SITE_OTHER): Payer: Medicare Other | Admitting: Pharmacist

## 2012-09-22 DIAGNOSIS — I4891 Unspecified atrial fibrillation: Secondary | ICD-10-CM | POA: Diagnosis not present

## 2012-09-22 DIAGNOSIS — Z7901 Long term (current) use of anticoagulants: Secondary | ICD-10-CM

## 2012-09-22 NOTE — Progress Notes (Signed)
Anti-Coagulation Progress Note  Decarlos Sepp is a 67 y.o. male who is currently on an anti-coagulation regimen.    RECENT RESULTS: Recent results are below, the most recent result is correlated with a dose of 27.5 mg. per week: Lab Results  Component Value Date   INR 3.10 09/22/2012   INR 2.00 08/25/2012   INR 2.4 07/14/2012    ANTI-COAG DOSE: Anticoagulation Dose Instructions as of 09/22/2012     [name] Tue Wed Thu Fri Sat   New Dose 5 mg 2.5 mg 5 mg 2.5 mg 5 mg 2.5 mg 2.5 mg       ANTICOAG SUMMARY: Anticoagulation Episode Summary   Current INR goal 2.0-3.0  Next INR check 10/20/2012  INR from last check 3.10! (09/22/2012)  Weekly max dose   Target end date Indefinite  INR check location   Preferred lab   Send INR reminders to ANTICOAG IMP   Indications  ATRIAL FIBRILLATION PAROXYSMAL CHRONIC [427.31] Long term current use of anticoagulant [V58.61]        Comments       Anticoagulation Care Providers   Provider Role Specialty Phone number   Bartholomew Crews, MD  Internal Medicine [phone]      ANTICOAG TODAY: Anticoagulation Summary as of 09/22/2012   INR goal 2.0-3.0  Selected INR 3.10! (09/22/2012)  Next INR check 10/20/2012  Target end date Indefinite   Indications  ATRIAL FIBRILLATION PAROXYSMAL CHRONIC [427.31] Long term current use of anticoagulant [V58.61]      Anticoagulation Episode Summary   INR check location    Preferred lab    Send INR reminders to ANTICOAG IMP   Comments     Anticoagulation Care Providers   Provider Role Specialty Phone number   Bartholomew Crews, MD  Internal Medicine [phone]      PATIENT INSTRUCTIONS: Patient Instructions  Patient instructed to take medications as defined in the Anti-coagulation Track section of this encounter.  Patient instructed to take today's dose.  Patient verbalized understanding of these instructions.       FOLLOW-UP Return in 4 weeks (on 10/20/2012) for Follow up INR at  2:15PM.  [name], III Pharm.D., CACP

## 2012-09-22 NOTE — Patient Instructions (Signed)
Patient instructed to take medications as defined in the Anti-coagulation Track section of this encounter.  Patient instructed to take today's dose.  Patient verbalized understanding of these instructions.    

## 2012-09-23 ENCOUNTER — Encounter: Payer: Self-pay | Admitting: Radiation Oncology

## 2012-09-23 ENCOUNTER — Telehealth: Payer: Self-pay | Admitting: *Deleted

## 2012-09-23 ENCOUNTER — Ambulatory Visit (INDEPENDENT_AMBULATORY_CARE_PROVIDER_SITE_OTHER): Payer: Medicare Other | Admitting: Radiation Oncology

## 2012-09-23 VITALS — BP 160/119 | HR 63 | Temp 97.5°F | Ht 69.0 in | Wt 215.0 lb

## 2012-09-23 DIAGNOSIS — M109 Gout, unspecified: Secondary | ICD-10-CM

## 2012-09-23 DIAGNOSIS — E119 Type 2 diabetes mellitus without complications: Secondary | ICD-10-CM | POA: Diagnosis not present

## 2012-09-23 LAB — BASIC METABOLIC PANEL
BUN: 24 mg/dL — ABNORMAL HIGH (ref 6–23)
CO2: 31 mEq/L (ref 19–32)
Chloride: 100 mEq/L (ref 96–112)
Potassium: 3.6 mEq/L (ref 3.5–5.3)
Sodium: 142 mEq/L (ref 135–145)

## 2012-09-23 LAB — GLUCOSE, CAPILLARY: Glucose-Capillary: 101 mg/dL — ABNORMAL HIGH (ref 70–99)

## 2012-09-23 LAB — POCT GLYCOSYLATED HEMOGLOBIN (HGB A1C): Hemoglobin A1C: 10.2

## 2012-09-23 LAB — URIC ACID: Uric Acid, Serum: 6.5 mg/dL (ref 4.0–7.8)

## 2012-09-23 MED ORDER — PREDNISONE 20 MG PO TABS: ORAL_TABLET | ORAL | Status: DC

## 2012-09-23 NOTE — Progress Notes (Signed)
Subjective:    Patient ID: Patrick [name], male    DOB: 30-Jun-1946, 67 y.o.   MRN: [id number]  Foot Pain  Hypertension   Patient is a 67 y.o. man with PMH significant for gout, chronic kidney disease, and atrial fibrillation on chronic anticoagulation who presents to clinic today with complaints of left foot pain for the last 3 days. Patient states that the pain is consistent with the gouty pain that he has had with previous gout attacks. He states the pain has been worsening over the last 3 days, and complains of swelling in the distal left foot. He denies any fever or chills, or any other new symptoms, and states he has otherwise been feeling well.  He has been taking his allopurinol at 200 mg per day as prescribed, but denies taking any other medications for relief of the pain. For his last gout flare in 05/2012, the patient was prescribed a five-day course of prednisone (with a 5 day taper), which he states alleviated his pain associated with that episode.    Review of Systems  All other systems reviewed and are negative.   Current Outpatient Medications: Current Outpatient Prescriptions  Medication Sig Dispense Refill  . allopurinol (ZYLOPRIM) 100 MG tablet Take 2 tablets (200 mg total) by mouth daily.  60 tablet  6  . aspirin 81 MG EC tablet Take 81 mg by mouth daily.        . benazepril (LOTENSIN) 40 MG tablet Take 1 tablet (40 mg total) by mouth daily.  62 tablet  5  . Blood Glucose Monitoring Suppl (ACCU-CHEK AVIVA PLUS) W/DEVICE KIT 1 each by Does not apply route as needed.  1 kit  0  . chlorthalidone (HYGROTON) 25 MG tablet Take 1 tablet (25 mg total) by mouth daily.  90 tablet  4  . cloNIDine (CATAPRES) 0.3 MG tablet Take 1 tablet (0.3 mg total) by mouth 2 (two) times daily.  60 tablet  3  . diltiazem (CARDIZEM) 90 MG tablet Take 1 tablet (90 mg total) by mouth 2 (two) times daily.  180 tablet  4  . famotidine (PEPCID) 20 MG tablet Take 1 tablet (20 mg total) by mouth 2 (two) times  daily as needed for heartburn.  62 tablet  5  . glipiZIDE (GLUCOTROL) 10 MG tablet Take 1.5 tablets (15 mg total) by mouth 2 (two) times daily before a meal.  180 tablet  3  . glucose blood (TRUETRACK TEST) test strip Use to test blood sugar 2-3 times daily dx code 250.00  100 each  11  . insulin NPH (NOVOLIN N) 100 UNIT/ML injection Inject 25 Units into the skin at bedtime.  10 mL  6  . Insulin Syringe-Needle U-100 (ELITE-THIN INS SYR .5CC/31G) 31G X 5/16" 0.5 ML MISC 1 each by Does not apply route 3 (three) times daily. Use to inject insulin daily  100 each  11  . Lancets 30G MISC Use to test blood glucose 2-3 times daily dx code 250.00  100 each  11  . Omega-3 Fatty Acids (FISH OIL) 1000 MG CAPS Take 1 capsule by mouth 2 (two) times daily.        . potassium chloride (MICRO-K) 10 MEQ CR capsule Take 1 capsule (10 mEq total) by mouth 2 (two) times daily.  60 capsule  3  . pravastatin (PRAVACHOL) 40 MG tablet Take 2 tablets (80 mg total) by mouth daily. TAKE 2 TABLETS BY MOUTH EVERYDAY.  60 tablet  6  . predniSONE (  DELTASONE) 20 MG tablet Take two tablets by mouth  Daily for 5 days, then one tablet by mouth daily for 5 days. Then stop.  15 tablet  0  . warfarin (COUMADIN) 5 MG tablet Take as directed by anticoagulation clinic provider.  100 tablet  1   No current facility-administered medications for this visit.    Allergies: No Known Allergies   Past Medical History: Past Medical History  Diagnosis Date  . Diabetes mellitus type II     Insulin dependent  . Hypertension   . Sleep apnea 07/2010  . CKD (chronic kidney disease), stage II   . Paroxysmal atrial fibrillation     on coumadin  . CAD (coronary artery disease)   . Depression   . Gout   . Hyperlipidemia   . CVA (cerebral infarction) 11/22/10    Thalamic with residual memory loss and slow speech    Past Surgical History: No past surgical history on file.  Family History: No family history on file.  Social  History: History   Social History  . Marital Status: Married    [name]    Number of Children: N/A  . Years of Education: N/A   Occupational History  . Not on file.   Social History Main Topics  . Smoking status: Former Smoker    Types: Cigarettes    Quit date: 10/26/1976  . Smokeless tobacco: Never Used  . Alcohol Use: No  . Drug Use: No  . Sexually Active: Not on file   Other Topics Concern  . Not on file   Social History Narrative  . No narrative on file     Vital Signs: Blood pressure 160/119, pulse 63, temperature 97.5 F (36.4 C), temperature source Oral, height 5\' 9"  (1.753 m), weight 215 lb (97.523 kg), SpO2 98.00%.       Objective:   Physical Exam  Constitutional: He is oriented to person, place, and time. He appears well-developed and well-nourished. No distress.  HENT:  Head: Normocephalic and atraumatic.  Eyes: Conjunctivae are normal. Pupils are equal, round, and reactive to light. No scleral icterus.  Neck: Normal range of motion. Neck supple. No tracheal deviation present.  Cardiovascular: Normal rate.  An irregularly irregular rhythm present.  No murmur heard. Pulmonary/Chest: Effort normal. He has no wheezes. He has no rales.  Abdominal: Soft. Bowel sounds are normal. He exhibits no distension. There is no tenderness.  Musculoskeletal: Normal range of motion. He exhibits no edema and no tenderness.  No edema, erythema, or tenderness of the L foot.   Neurological: He is alert and oriented to person, place, and time.  Skin: Skin is warm and dry. No erythema.  Psychiatric: He has a normal mood and affect. His behavior is normal.          Assessment & Plan:

## 2012-09-23 NOTE — Telephone Encounter (Signed)
Pt presents c/o L foot, painful. Pt is ask to verify his address and date of birth, he knows his birthdate but not his address nor telephone #. His affect is somewhat flat. His gait when amb is slightly unsteady. i spoke to dr Marinda Elk, he has spoken to pt and clinic staff has been ask about pt's usual manner, he does state his wife is in the car, does not know anything about the car except it is grey, he is not sure of how to tell us where she parked, [place]. Is familiar w/ pt's wife and will go out and look for her, pt at this time is resting in triage office recliner. i will order him some lunch w/ dr Marinda Elk approval. He is added to dr mctyre's schedule for 1315

## 2012-09-23 NOTE — Patient Instructions (Signed)
Gout Gout is an inflammatory condition (arthritis) caused by a buildup of uric acid crystals in the joints. Uric acid is a chemical that is normally present in the blood. Under some circumstances, uric acid can form into crystals in your joints. This causes joint redness, soreness, and swelling (inflammation). Repeat attacks are common. Over time, uric acid crystals can form into masses (tophi) near a joint, causing disfigurement. Gout is treatable and often preventable. CAUSES  The disease begins with elevated levels of uric acid in the blood. Uric acid is produced by your body when it breaks down a naturally found substance called purines. This also happens when you eat certain foods such as meats and fish. Causes of an elevated uric acid level include:  Being passed down from parent to child (heredity).  Diseases that cause increased uric acid production (obesity, psoriasis, some cancers).  Excessive alcohol use.  Diet, especially diets rich in meat and seafood.  Medicines, including certain cancer-fighting drugs (chemotherapy), diuretics, and aspirin.  Chronic kidney disease. The kidneys are no longer able to remove uric acid well.  Problems with metabolism. Conditions strongly associated with gout include:  Obesity.  High blood pressure.  High cholesterol.  Diabetes. Not everyone with elevated uric acid levels gets gout. It is not understood why some people get gout and others do not. Surgery, joint injury, and eating too much of certain foods are some of the factors that can lead to gout. SYMPTOMS   An attack of gout comes on quickly. It causes intense pain with redness, swelling, and warmth in a joint.  Fever can occur.  Often, only one joint is involved. Certain joints are more commonly involved:  Base of the big toe.  Knee.  Ankle.  Wrist.  Finger. Without treatment, an attack usually goes away in a few days to weeks. Between attacks, you usually will not have  symptoms, which is different from many other forms of arthritis. DIAGNOSIS  Your caregiver will suspect gout based on your symptoms and exam. Removal of fluid from the joint (arthrocentesis) is done to check for uric acid crystals. Your caregiver will give you a medicine that numbs the area (local anesthetic) and use a needle to remove joint fluid for exam. Gout is confirmed when uric acid crystals are seen in joint fluid, using a special microscope. Sometimes, blood, urine, and X-ray tests are also used. TREATMENT  There are 2 phases to gout treatment: treating the sudden onset (acute) attack and preventing attacks (prophylaxis). Treatment of an Acute Attack  Medicines are used. These include anti-inflammatory medicines or steroid medicines.  An injection of steroid medicine into the affected joint is sometimes necessary.  The painful joint is rested. Movement can worsen the arthritis.  You may use warm or cold treatments on painful joints, depending which works best for you.  Discuss the use of coffee, vitamin C, or cherries with your caregiver. These may be helpful treatment options. Treatment to Prevent Attacks After the acute attack subsides, your caregiver may advise prophylactic medicine. These medicines either help your kidneys eliminate uric acid from your body or decrease your uric acid production. You may need to stay on these medicines for a very long time. The early phase of treatment with prophylactic medicine can be associated with an increase in acute gout attacks. For this reason, during the first few months of treatment, your caregiver may also advise you to take medicines usually used for acute gout treatment. Be sure you understand your caregiver's directions.   You should also discuss dietary treatment with your caregiver. Certain foods such as meats and fish can increase uric acid levels. Other foods such as dairy can decrease levels. Your caregiver can give you a list of foods  to avoid. HOME CARE INSTRUCTIONS   Do not take aspirin to relieve pain. This raises uric acid levels.  Only take over-the-counter or prescription medicines for pain, discomfort, or fever as directed by your caregiver.  Rest the joint as much as possible. When in bed, keep sheets and blankets off painful areas.  Keep the affected joint raised (elevated).  Use crutches if the painful joint is in your leg.  Drink enough water and fluids to keep your urine clear or pale yellow. This helps your body get rid of uric acid. Do not drink alcoholic beverages. They slow the passage of uric acid.  Follow your caregiver's dietary instructions. Pay careful attention to the amount of protein you eat. Your daily diet should emphasize fruits, vegetables, whole grains, and fat-free or low-fat milk products.  Maintain a healthy body weight. SEEK MEDICAL CARE IF:   You have an oral temperature above 102 F (38.9 C).  You develop diarrhea, vomiting, or any side effects from medicines.  You do not feel better in 24 hours, or you are getting worse. SEEK IMMEDIATE MEDICAL CARE IF:   Your joint becomes suddenly more tender and you have:  Chills.  An oral temperature above 102 F (38.9 C), not controlled by medicine. MAKE SURE YOU:   Understand these instructions.  Will watch your condition.  Will get help right away if you are not doing well or get worse. Document Released: 07/13/2000 Document Revised: 10/08/2011 Document Reviewed: 10/24/2009 [employer] Patient Information 2013 [employer].    

## 2012-09-23 NOTE — Assessment & Plan Note (Addendum)
Patient complaints and history of gout suggest that he is suffering from an acute gout attack, however physical exam was unrevealing. It is not surprising that he would have a gout attack, given that he just recently started allopurinol therapy less than 3 months ago. As he has CKD and is on warfarin, NSAIDs will be avoided. Colchicine will also be avoided (due to CKD).  - prednisone 40mg  x 5 days then 20mg  x 5 days - check uric acid level (as pt is on allopurinol)

## 2012-09-23 NOTE — Assessment & Plan Note (Addendum)
Hemoglobin A1c had been drawn prior to encounter with patient, and was found to be elevated over his previous (7.9=>10.2). No changes made to his anti-hyperglycemic regimen today. Will defer management of patient's diabetes mellitus to his PCP, Dr. Obie Dredge.   - cont glipizide - cont insulin NPH (25U qhs)  Lab Results  Component Value Date   HGBA1C 10.2 09/23/2012

## 2012-10-04 ENCOUNTER — Other Ambulatory Visit: Payer: Self-pay | Admitting: Internal Medicine

## 2012-10-20 ENCOUNTER — Ambulatory Visit (INDEPENDENT_AMBULATORY_CARE_PROVIDER_SITE_OTHER): Payer: Medicare Other | Admitting: Pharmacist

## 2012-10-20 DIAGNOSIS — I4891 Unspecified atrial fibrillation: Secondary | ICD-10-CM

## 2012-10-20 DIAGNOSIS — Z7901 Long term (current) use of anticoagulants: Secondary | ICD-10-CM

## 2012-10-20 LAB — POCT INR: INR: 1.8

## 2012-10-20 NOTE — Patient Instructions (Signed)
Patient instructed to take medications as defined in the Anti-coagulation Track section of this encounter.  Patient instructed to take today's dose.  Patient verbalized understanding of these instructions.    

## 2012-10-20 NOTE — Progress Notes (Signed)
Anti-Coagulation Progress Note  Patrick [name] is a 67 y.o. male who is currently on an anti-coagulation regimen.    RECENT RESULTS: Recent results are below, the most recent result is correlated with a dose of 25 mg. per week: Lab Results  Component Value Date   INR 1.80 10/20/2012   INR 3.10 09/22/2012   INR 2.00 08/25/2012    ANTI-COAG DOSE: Anticoagulation Dose Instructions as of 10/20/2012     [name] Tue Wed Thu Fri Sat   New Dose 5 mg 5 mg 5 mg 2.5 mg 5 mg 2.5 mg 5 mg       ANTICOAG SUMMARY: Anticoagulation Episode Summary   Current INR goal 2.0-3.0  Next INR check 11/10/2012  INR from last check 1.80! (10/20/2012)  Weekly max dose   Target end date Indefinite  INR check location   Preferred lab   Send INR reminders to ANT[facility]AG [facility]   Indications  ATRIAL FIBRILLATION PAROXYSMAL CHRONIC [427.31] Long term current use of anticoagulant [V58.61]        Comments       Anticoagulation Care Providers   Provider Role Specialty Phone number   Bartholomew Crews, MD  Internal Medicine [phone]      ANTICOAG TODAY: Anticoagulation Summary as of 10/20/2012   INR goal 2.0-3.0  Selected INR 1.80! (10/20/2012)  Next INR check 11/10/2012  Target end date Indefinite   Indications  ATRIAL FIBRILLATION PAROXYSMAL CHRONIC [427.31] Long term current use of anticoagulant [V58.61]      Anticoagulation Episode Summary   INR check location    Preferred lab    Send INR reminders to ANTICOAG [facility]   Comments     Anticoagulation Care Providers   Provider Role Specialty Phone number   Bartholomew Crews, MD  Internal Medicine [phone]      PATIENT INSTRUCTIONS: Patient Instructions  Patient instructed to take medications as defined in the Anti-coagulation Track section of this encounter.  Patient instructed to take today's dose.  Patient verbalized understanding of these instructions.       FOLLOW-UP Return in 3 weeks (on 11/10/2012) for Follow up INR at  [place], III Pharm.D., CACP

## 2012-10-23 ENCOUNTER — Encounter: Payer: Self-pay | Admitting: Internal Medicine

## 2012-10-23 ENCOUNTER — Ambulatory Visit (INDEPENDENT_AMBULATORY_CARE_PROVIDER_SITE_OTHER): Payer: Medicare Other | Admitting: Internal Medicine

## 2012-10-23 VITALS — BP 109/64 | HR 46 | Temp 96.6°F | Ht 68.0 in | Wt 212.2 lb

## 2012-10-23 DIAGNOSIS — I4891 Unspecified atrial fibrillation: Secondary | ICD-10-CM

## 2012-10-23 DIAGNOSIS — I10 Essential (primary) hypertension: Secondary | ICD-10-CM

## 2012-10-23 DIAGNOSIS — Z7901 Long term (current) use of anticoagulants: Secondary | ICD-10-CM

## 2012-10-23 DIAGNOSIS — N183 Chronic kidney disease, stage 3 unspecified: Secondary | ICD-10-CM | POA: Diagnosis not present

## 2012-10-23 DIAGNOSIS — E119 Type 2 diabetes mellitus without complications: Secondary | ICD-10-CM | POA: Diagnosis not present

## 2012-10-23 MED ORDER — WARFARIN SODIUM 5 MG PO TABS: ORAL_TABLET | ORAL | Status: DC

## 2012-10-23 MED ORDER — FUROSEMIDE 80 MG PO TABS: 80.0000 mg | ORAL_TABLET | Freq: Every day | ORAL | Status: DC

## 2012-10-23 NOTE — Progress Notes (Signed)
Subjective:   Patient ID: Patrick [name] male   DOB: 1945-09-30 67 y.o.   MRN: [id number]  HPI: Patrick [name] is a 67 y.o. man who presents to clinic today for follow up on his chronic medical conditions including hypertension, diabetes, CKD stage III, and atrial fibrillation.  See Problem focused Assessment and Plan for full details of his chronic medical conditions.   Past Medical History  Diagnosis Date  . Diabetes mellitus type II     Insulin dependent  . Hypertension   . Sleep apnea 07/2010  . CKD (chronic kidney disease), stage II   . Paroxysmal atrial fibrillation     on coumadin  . CAD (coronary artery disease)   . Depression   . Gout   . Hyperlipidemia   . 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 allopurinol (ZYLOPRIM) 100 MG tablet Take 2 tablets (200 mg total) by mouth daily.  60 tablet  6  . aspirin 81 MG EC tablet Take 81 mg by mouth daily.        . benazepril (LOTENSIN) 40 MG tablet Take 1 tablet (40 mg total) by mouth daily.  62 tablet  5  . Blood Glucose Monitoring Suppl (ACCU-CHEK AVIVA PLUS) W/DEVICE KIT 1 each by Does not apply route as needed.  1 kit  0  . chlorthalidone (HYGROTON) 25 MG tablet Take 1 tablet (25 mg total) by mouth daily.  90 tablet  4  . cloNIDine (CATAPRES) 0.3 MG tablet Take 1 tablet (0.3 mg total) by mouth 2 (two) times daily.  60 tablet  3  . diltiazem (CARDIZEM) 90 MG tablet Take 1 tablet (90 mg total) by mouth 2 (two) times daily.  180 tablet  4  . famotidine (PEPCID) 20 MG tablet Take 1 tablet (20 mg total) by mouth 2 (two) times daily as needed for heartburn.  62 tablet  5  . glipiZIDE (GLUCOTROL) 10 MG tablet Take 1.5 tablets (15 mg total) by mouth 2 (two) times daily before a meal.  180 tablet  3  . glucose blood (TRUETRACK TEST) test strip Use to test blood sugar 2-3 times daily dx code 250.00  100 each  11  . insulin NPH  (NOVOLIN N) 100 UNIT/ML injection Inject 25 Units into the skin at bedtime.  10 mL  6  . Insulin Syringe-Needle U-100 (ELITE-THIN INS SYR .5CC/31G) 31G X 5/16" 0.5 ML MISC 1 each by Does not apply route 3 (three) times daily. Use to inject insulin daily  100 each  11  . Lancets 30G MISC Use to test blood glucose 2-3 times daily dx code 250.00  100 each  11  . Omega-3 Fatty Acids (FISH OIL) 1000 MG CAPS Take 1 capsule by mouth 2 (two) times daily.        . potassium chloride (MICRO-K) 10 MEQ CR capsule Take 1 capsule (10 mEq total) by mouth 2 (two) times daily.  60 capsule  3  . pravastatin (PRAVACHOL) 40 MG tablet Take 2 tablets (80 mg total) by mouth daily. TAKE 2 TABLETS BY MOUTH EVERYDAY.  60 tablet  6  . predniSONE (DELTASONE) 20 MG tablet Take two tablets by mouth  Daily for 5 days, then one tablet by mouth daily for 5 days. Then stop.  15 tablet  0  . warfarin (COUMADIN) 5 MG tablet Take as directed by anticoagulation clinic provider.  100  tablet  1   No current facility-administered medications for this visit.   No family history on file. History   Social History  . Marital Status: Married    [name]    Number of Children: N/A  . Years of Education: N/A   Social History Main Topics  . Smoking status: Former Smoker    Types: Cigarettes    Quit date: 10/26/1976  . Smokeless tobacco: Never Used  . Alcohol Use: No  . Drug Use: No  . Sexually Active: None   Other Topics Concern  . None   Social History Narrative  . None   Review of Systems: A full 12 system ROS is negative except as noted in the HPI and A&P.   Objective:  Physical Exam: Filed Vitals:   10/23/12 1424  BP: 109/64  Pulse: 46  Temp: 96.6 F (35.9 C)  TempSrc: Oral  Height: 5\' 8"  (1.727 m)  Weight: 212 lb 3.2 oz (96.253 kg)  SpO2: 98%   Constitutional: Vital signs reviewed.  Patient is a well-developed and well-nourished man in no acute distress and cooperative with exam. Alert and oriented x3  though very slow to answer.  Head: Normocephalic and atraumatic Ear: TM normal bilaterally Mouth: no erythema or exudates, MMM Eyes: PERRL, EOMI, conjunctivae normal, No scleral icterus.  Neck: Supple, Trachea midline normal ROM, No JVD, mass, thyromegaly, or carotid bruit present.  Cardiovascular: irregularly irregular rate and rhythm, S1 normal, S2 normal, no MRG, pulses symmetric and intact bilaterally Pulmonary/Chest: CTAB, no wheezes, rales, or rhonchi Abdominal: Soft. Non-tender, non-distended, bowel sounds are normal, no masses, organomegaly, or guarding present.  GU: no CVA tenderness Musculoskeletal: No joint deformities, erythema, or stiffness, ROM full and no nontender Hematology: no cervical, inginal, or axillary adenopathy.  Neurological: A&O x3, Strength is normal and symmetric bilaterally, cranial nerve II-XII are grossly intact, no focal motor deficit, sensory intact to light touch bilaterally.  Skin: Warm, dry and intact. No rash, cyanosis, or clubbing.  Psychiatric: depressed mood and very flat affect. speech is slowed though understandable and goal directed.  Behavior is normal. Judgment and insight are poor.  Thought content normal. Cognition is slowed but memory are normal.   Assessment & Plan:

## 2012-10-23 NOTE — Patient Instructions (Addendum)
1.  Continue your medications as prescribed.    2. Let me know when you get your drug plan and we will switch the cholesterol medication to something cheaper for you.  3.  Follow up in the end of May.

## 2012-10-30 NOTE — Progress Notes (Signed)
I have reviewed the note by Dr. Elie Confer.  Indication for anticoagulation is Atrial fibrillation.  INR 2.0

## 2012-11-04 ENCOUNTER — Other Ambulatory Visit: Payer: Self-pay | Admitting: *Deleted

## 2012-11-05 MED ORDER — PRAVASTATIN SODIUM 40 MG PO TABS: 80.0000 mg | ORAL_TABLET | Freq: Every day | ORAL | Status: DC

## 2012-11-10 ENCOUNTER — Ambulatory Visit (INDEPENDENT_AMBULATORY_CARE_PROVIDER_SITE_OTHER): Payer: Medicare Other | Admitting: Pharmacist

## 2012-11-10 DIAGNOSIS — Z7901 Long term (current) use of anticoagulants: Secondary | ICD-10-CM | POA: Diagnosis not present

## 2012-11-10 DIAGNOSIS — I4891 Unspecified atrial fibrillation: Secondary | ICD-10-CM | POA: Diagnosis not present

## 2012-11-10 NOTE — Patient Instructions (Signed)
Patient instructed to take medications as defined in the Anti-coagulation Track section of this encounter.  Patient instructed to take today's dose.  Patient verbalized understanding of these instructions.    

## 2012-11-10 NOTE — Progress Notes (Signed)
Anti-Coagulation Progress Note  Patrick [name] is a 67 y.o. male who is currently on an anti-coagulation regimen.    RECENT RESULTS: Recent results are below, the most recent result is correlated with a dose of 30 mg. per week: Lab Results  Component Value Date   INR 3.10 11/10/2012   INR 1.80 10/20/2012   INR 3.10 09/22/2012    ANTI-COAG DOSE: Anticoagulation Dose Instructions as of 11/10/2012     [name] Tue Wed Thu Fri Sat   New Dose 5 mg 2.5 mg 5 mg 2.5 mg 5 mg 2.5 mg 5 mg       ANTICOAG SUMMARY: Anticoagulation Episode Summary   Current INR goal 2.0-3.0  Next INR check 12/08/2012  INR from last check 3.10! (11/10/2012)  Weekly max dose   Target end date Indefinite  INR check location   Preferred lab   Send INR reminders to ANT[facility]AG [facility]   Indications  ATRIAL FIBRILLATION PAROXYSMAL CHRONIC [427.31] Long term current use of anticoagulant [V58.61]        Comments       Anticoagulation Care Providers   Provider Role Specialty Phone number   Bartholomew Crews, MD  Internal Medicine [phone]      ANTICOAG TODAY: Anticoagulation Summary as of 11/10/2012   INR goal 2.0-3.0  Selected INR 3.10! (11/10/2012)  Next INR check 12/08/2012  Target end date Indefinite   Indications  ATRIAL FIBRILLATION PAROXYSMAL CHRONIC [427.31] Long term current use of anticoagulant [V58.61]      Anticoagulation Episode Summary   INR check location    Preferred lab    Send INR reminders to ANTICOAG [facility]   Comments     Anticoagulation Care Providers   Provider Role Specialty Phone number   Bartholomew Crews, MD  Internal Medicine [phone]      PATIENT INSTRUCTIONS: Patient Instructions  Patient instructed to take medications as defined in the Anti-coagulation Track section of this encounter.  Patient instructed to take today's dose.  Patient verbalized understanding of these instructions.       FOLLOW-UP Return in 4 weeks (on 12/08/2012) for Follow up INR at  2:30PM.  [name], III Pharm.D., CACP

## 2012-11-17 NOTE — Progress Notes (Signed)
I have reviewed the note by Dr. Elie Confer.  The indication for anticoagulation is atrial fibrillation.

## 2012-12-04 ENCOUNTER — Other Ambulatory Visit: Payer: Self-pay | Admitting: *Deleted

## 2012-12-04 DIAGNOSIS — I10 Essential (primary) hypertension: Secondary | ICD-10-CM

## 2012-12-04 MED ORDER — DILTIAZEM HCL 90 MG PO TABS: 90.0000 mg | ORAL_TABLET | Freq: Two times a day (BID) | ORAL | Status: DC

## 2012-12-08 ENCOUNTER — Ambulatory Visit (INDEPENDENT_AMBULATORY_CARE_PROVIDER_SITE_OTHER): Payer: Medicare Other | Admitting: Pharmacist

## 2012-12-08 DIAGNOSIS — I4891 Unspecified atrial fibrillation: Secondary | ICD-10-CM

## 2012-12-08 DIAGNOSIS — Z7901 Long term (current) use of anticoagulants: Secondary | ICD-10-CM | POA: Diagnosis not present

## 2012-12-08 NOTE — Progress Notes (Signed)
Anti-Coagulation Progress Note  Patrick [name] is a 67 y.o. male who is currently on an anti-coagulation regimen.    RECENT RESULTS: Recent results are below, the most recent result is correlated with a dose of 27.5 mg. per week: Lab Results  Component Value Date   INR 2.30 12/08/2012   INR 3.10 11/10/2012   INR 1.80 10/20/2012    ANTI-COAG DOSE: Anticoagulation Dose Instructions as of 12/08/2012     [name] Tue Wed Thu Fri Sat   New Dose 5 mg 2.5 mg 5 mg 2.5 mg 5 mg 2.5 mg 5 mg       ANTICOAG SUMMARY: Anticoagulation Episode Summary   Current INR goal 2.0-3.0  Next INR check 01/05/2013  INR from last check 2.30 (12/08/2012)  Weekly max dose   Target end date Indefinite  INR check location   Preferred lab   Send INR reminders to ANTICOAG [facility]   Indications  ATRIAL FIBRILLATION PAROXYSMAL CHRONIC [427.31] Long term current use of anticoagulant [V58.61]        Comments       Anticoagulation Care Providers   Provider Role Specialty Phone number   Bartholomew Crews, MD  Internal Medicine [phone]      ANTICOAG TODAY: Anticoagulation Summary as of 12/08/2012   INR goal 2.0-3.0  Selected INR 2.30 (12/08/2012)  Next INR check 01/05/2013  Target end date Indefinite   Indications  ATRIAL FIBRILLATION PAROXYSMAL CHRONIC [427.31] Long term current use of anticoagulant [V58.61]      Anticoagulation Episode Summary   INR check location    Preferred lab    Send INR reminders to ANTICOAG [facility]   Comments     Anticoagulation Care Providers   Provider Role Specialty Phone number   Bartholomew Crews, MD  Internal Medicine [phone]      PATIENT INSTRUCTIONS: Patient Instructions  Patient instructed to take medications as defined in the Anti-coagulation Track section of this encounter.  Patient instructed to take today's dose.  Patient verbalized understanding of these instructions.       FOLLOW-UP Return in 4 weeks (on 01/05/2013) for Follow up INR at  2:45PM.  [name], III Pharm.D., CACP

## 2012-12-08 NOTE — Patient Instructions (Signed)
Patient instructed to take medications as defined in the Anti-coagulation Track section of this encounter.  Patient instructed to take today's dose.  Patient verbalized understanding of these instructions.    

## 2012-12-19 ENCOUNTER — Encounter: Payer: Medicare Other | Admitting: Internal Medicine

## 2013-01-05 ENCOUNTER — Ambulatory Visit (INDEPENDENT_AMBULATORY_CARE_PROVIDER_SITE_OTHER): Payer: Medicare Other | Admitting: Pharmacist

## 2013-01-05 ENCOUNTER — Other Ambulatory Visit: Payer: Self-pay | Admitting: *Deleted

## 2013-01-05 DIAGNOSIS — I4891 Unspecified atrial fibrillation: Secondary | ICD-10-CM | POA: Diagnosis not present

## 2013-01-05 DIAGNOSIS — Z7901 Long term (current) use of anticoagulants: Secondary | ICD-10-CM | POA: Diagnosis not present

## 2013-01-05 LAB — POCT INR: INR: 2.8

## 2013-01-05 MED ORDER — PRAVASTATIN SODIUM 80 MG PO TABS: 80.0000 mg | ORAL_TABLET | Freq: Every day | ORAL | Status: DC

## 2013-01-05 NOTE — Patient Instructions (Signed)
Patient instructed to take medications as defined in the Anti-coagulation Track section of this encounter.  Patient instructed to take today's dose.  Patient verbalized understanding of these instructions.    

## 2013-01-05 NOTE — Telephone Encounter (Signed)
Prescription updated to reflect the 80 mg tablet.  Take 1 tablet daily for the cholesterol

## 2013-01-05 NOTE — Progress Notes (Signed)
Anti-Coagulation Progress Note  Patrick [name] is a 67 y.o. male who is currently on an anti-coagulation regimen.    RECENT RESULTS: Recent results are below, the most recent result is correlated with a dose of 27.5 mg. per week: Lab Results  Component Value Date   INR 2.80 01/05/2013   INR 2.30 12/08/2012   INR 3.10 11/10/2012    ANTI-COAG DOSE: Anticoagulation Dose Instructions as of 01/05/2013     [name] Tue Wed Thu Fri Sat   New Dose 5 mg 2.5 mg 5 mg 2.5 mg 5 mg 2.5 mg 5 mg       ANTICOAG SUMMARY: Anticoagulation Episode Summary   Current INR goal 2.0-3.0  Next INR check 02/02/2013  INR from last check 2.80 (01/05/2013)  Weekly max dose   Target end date Indefinite  INR check location   Preferred lab   Send INR reminders to ANTICOAG [facility]   Indications  ATRIAL FIBRILLATION PAROXYSMAL CHRONIC [427.31] Long term current use of anticoagulant [V58.61]        Comments       Anticoagulation Care Providers   Provider Role Specialty Phone number   Bartholomew Crews, MD  Internal Medicine [phone]      ANTICOAG TODAY: Anticoagulation Summary as of 01/05/2013   INR goal 2.0-3.0  Selected INR 2.80 (01/05/2013)  Next INR check 02/02/2013  Target end date Indefinite   Indications  ATRIAL FIBRILLATION PAROXYSMAL CHRONIC [427.31] Long term current use of anticoagulant [V58.61]      Anticoagulation Episode Summary   INR check location    Preferred lab    Send INR reminders to ANTICOAG [facility]   Comments     Anticoagulation Care Providers   Provider Role Specialty Phone number   Bartholomew Crews, MD  Internal Medicine [phone]      PATIENT INSTRUCTIONS: Patient Instructions  Patient instructed to take medications as defined in the Anti-coagulation Track section of this encounter.  Patient instructed to take today's dose.  Patient verbalized understanding of these instructions.       FOLLOW-UP Return in 4 weeks (on 02/02/2013) for Follow up INR at  2:45PM.  [name], III Pharm.D., CACP

## 2013-01-05 NOTE — Telephone Encounter (Signed)
Pt's spouse states they were told at the pharmacy that insurance provider would only pay for 80mg   And only 30 at a time, please send a new script reflecting this, thanks

## 2013-01-12 NOTE — Assessment & Plan Note (Signed)
Refill given today.  Last INR was mildly subtherapeutic.  Follows with Dr. Elie Confer and is on indefinite coumadin.

## 2013-01-12 NOTE — Assessment & Plan Note (Signed)
Lab Results  Component Value Date   HGBA1C 10.2 09/23/2012   HGBA1C 7.9 05/28/2012   HGBA1C 9.6 02/29/2012   Lab Results  Component Value Date   MICROALBUR 37.60* 10/27/2010   LDLCALC 61 12/14/2011   CREATININE 2.08* 09/23/2012   He states that he is taking his medication but did not bring his meter with him today.  He is unable to tell me how much insulin he is supposed to be taking.  His wife states that he tells her that he takes his medication.    A1C is creeping up again which means is he is likely not taking his insulin or is missing more often then not.  We discussed the importance of taking his medications.  I asked the wife to ensure that he takes the medications since he has become more forgetful since his stroke.  She states that she will try to monitor him more closely.

## 2013-01-12 NOTE — Assessment & Plan Note (Signed)
BP Readings from Last 3 Encounters:  10/23/12 109/64  09/23/12 160/119  08/04/12 187/111   He states that he is taking his medications as prescribed but he brought his bottles of medications with today and it appears that he has not been taking his Benazepril.  He states that Dr. Clover Mealy his his nephrologist and she took him off it.  He denies headaches, chest pain, or changes in his vision.  BP is well controlled today but he has a history of taking more of his medication then prescribed just prior to coming to the visit.  We will discuss with Dr. Clover Mealy if he is supposed to be taking the benazepril.

## 2013-01-12 NOTE — Assessment & Plan Note (Signed)
He is followed by Dr. Clover Mealy for CKD stage III.  He denies changes in his urination, fatigue, or nausea.  We will continue to follow along with her and avoid nephrotoxic drugs.  I encouraged him to continue close monitoring of his blood pressure and blood sugar to decrease the damage to his kidneys and stave off dialysis as long as possible.

## 2013-01-12 NOTE — Assessment & Plan Note (Signed)
HE continues in atrial fibrillation and is on coumadin.  He denies SOB, DOE, or chest pain today.    We will refill his coumadin and he is due to recheck his coumadin on 4/14 with Dr. Elie Confer.

## 2013-01-15 ENCOUNTER — Ambulatory Visit (INDEPENDENT_AMBULATORY_CARE_PROVIDER_SITE_OTHER): Payer: Medicare Other | Admitting: Internal Medicine

## 2013-01-15 ENCOUNTER — Telehealth: Payer: Self-pay | Admitting: *Deleted

## 2013-01-15 ENCOUNTER — Encounter: Payer: Self-pay | Admitting: Internal Medicine

## 2013-01-15 VITALS — BP 170/100 | HR 90 | Temp 98.2°F | Ht 68.0 in | Wt 213.6 lb

## 2013-01-15 DIAGNOSIS — E119 Type 2 diabetes mellitus without complications: Secondary | ICD-10-CM

## 2013-01-15 DIAGNOSIS — M109 Gout, unspecified: Secondary | ICD-10-CM

## 2013-01-15 LAB — GLUCOSE, CAPILLARY

## 2013-01-15 MED ORDER — PREDNISONE 20 MG PO TABS: 20.0000 mg | ORAL_TABLET | Freq: Every day | ORAL | Status: DC

## 2013-01-15 MED ORDER — PREDNISONE 20 MG PO TABS: ORAL_TABLET | ORAL | Status: DC

## 2013-01-15 NOTE — Progress Notes (Signed)
Subjective:   Patient ID: Patrick [name] male   DOB: 30-Mar-1946 67 y.o.   MRN: [id number]  HPI: Patrick [name] is a 67 y.o. man with PMH significant for gout, DM, HTN, HLD, chronic kidney disease, and atrial fibrillation on chronic anticoagulation, who presents to clinic  with complaints of left foot pain for the last 3 days.   He reports the left mid foot pain and swelling over the last 3 days after he had some sausages. Patient states that the pain is consistent with the gouty pain that he has had with previous gout attacks. He denies any fever or chills, or any other new symptoms, and states he has otherwise been feeling well. He states that his foot pain and swelling is already better after he took 3 prednisone pills yesterday. He states that the prednisone pills were left from the previous prescription. Denies trauma or injury. Denies calf pain with ambulation.   Of note, he has been taking allopurinol 200 mg per day as prescribed, His last gout flare was in 05/2012 and 09/23/12, the patient was prescribed a ten-day course of prednisone taper, which alleviated his symptoms.   Past Medical History  Diagnosis Date  . Diabetes mellitus type II     Insulin dependent  . Hypertension   . Sleep apnea 07/2010  . CKD (chronic kidney disease), stage II   . Paroxysmal atrial fibrillation     on coumadin  . CAD (coronary artery disease)   . Depression   . Gout   . Hyperlipidemia   . 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 allopurinol (ZYLOPRIM) 100 MG tablet Take 2 tablets (200 mg total) by mouth daily.  60 tablet  6  . aspirin 81 MG EC tablet Take 81 mg by mouth daily.        . benazepril (LOTENSIN) 40 MG tablet Take 1 tablet (40 mg total) by mouth daily.  62 tablet  5  . Blood Glucose Monitoring Suppl (ACCU-CHEK AVIVA PLUS) W/DEVICE KIT 1 each by Does not apply route as  needed.  1 kit  0  . cloNIDine (CATAPRES) 0.3 MG tablet Take 1 tablet (0.3 mg total) by mouth 2 (two) times daily.  60 tablet  3  . diltiazem (CARDIZEM) 90 MG tablet Take 1 tablet (90 mg total) by mouth 2 (two) times daily.  180 tablet  2  . glucose blood (TRUETRACK TEST) test strip Use to test blood sugar 2-3 times daily dx code 250.00  100 each  11  . insulin NPH (NOVOLIN N) 100 UNIT/ML injection Inject 25 Units into the skin at bedtime.  10 mL  6  . Insulin Syringe-Needle U-100 (ELITE-THIN INS SYR .5CC/31G) 31G X 5/16" 0.5 ML MISC 1 each by Does not apply route 3 (three) times daily. Use to inject insulin daily  100 each  11  . Lancets 30G MISC Use to test blood glucose 2-3 times daily dx code 250.00  100 each  11  . Omega-3 Fatty Acids (FISH OIL) 1000 MG CAPS Take 1 capsule by mouth 2 (two) times daily.        . potassium chloride (MICRO-K) 10 MEQ CR capsule Take 1 capsule (10 mEq total) by mouth 2 (two) times daily.  60 capsule  3  . pravastatin (PRAVACHOL) 80 MG tablet Take 1 tablet (80 mg total) by mouth daily.  30 tablet  5  . warfarin (COUMADIN) 5 MG tablet Take as directed by anticoagulation clinic provider.  100 tablet  1  . famotidine (PEPCID) 20 MG tablet Take 1 tablet (20 mg total) by mouth 2 (two) times daily as needed for heartburn.  62 tablet  5  . furosemide (LASIX) 80 MG tablet Take 1 tablet (80 mg total) by mouth daily.  45 tablet  3  . glipiZIDE (GLUCOTROL) 10 MG tablet Take 1.5 tablets (15 mg total) by mouth 2 (two) times daily before a meal.  180 tablet  3  . predniSONE (DELTASONE) 20 MG tablet Take 40 mg ( 2 tablets) po daily for 5 days, and 20 mg po daily for 5 days. Then stop.  15 tablet  0   No current facility-administered medications for this visit.   No family history on file. History   Social History  . Marital Status: Married    [name]    Number of Children: N/A  . Years of Education: N/A   Social History Main Topics  . Smoking status: Former  Smoker    Types: Cigarettes    Quit date: 10/26/1976  . Smokeless tobacco: Never Used  . Alcohol Use: No  . Drug Use: No  . Sexually Active: None   Other Topics Concern  . None   Social History Narrative  . None   Review of Systems: Review of Systems:  Constitutional:  Denies fever, chills, diaphoresis, appetite change and fatigue.   HEENT:  Denies congestion, sore throat, rhinorrhea, sneezing, mouth sores, trouble swallowing, neck pain   Respiratory:  Denies SOB, DOE, cough, and wheezing.   Cardiovascular:  Denies palpitations and leg swelling.   Gastrointestinal:  Denies nausea, vomiting, abdominal pain, diarrhea, constipation, blood in stool and abdominal distention.   Genitourinary:  Denies dysuria, urgency, frequency, hematuria, flank pain and difficulty urinating.   Musculoskeletal:  Denies myalgias, back pain,and gait problem. Left foot pain and swelling  Skin:  Denies pallor, rash and wound.   Neurological:  Denies dizziness, seizures, syncope, weakness, light-headedness, numbness and headaches.    .    Objective:  Physical Exam: Filed Vitals:   01/15/13 1417  BP: 193/106  Pulse: 93  Temp: 98.2 F (36.8 C)  TempSrc: Oral  Height: 5\' 8"  (1.727 m)  Weight: 213 lb 9.6 oz (96.888 kg)  SpO2: 98%   General: alert, well-developed, and cooperative to examination.  Head: normocephalic and atraumatic.  Eyes: vision grossly intact, pupils equal, pupils round, pupils reactive to light, no injection and anicteric.  Mouth: pharynx pink and moist, no erythema, and no exudates.  Neck: supple, full ROM, no thyromegaly, no JVD, and no carotid bruits.  Lungs: normal respiratory effort, no accessory muscle use, normal breath sounds, no crackles, and no wheezes. Heart: normal rate, irregular rhythm, no murmur, no gallop, and no rub.  Abdomen: soft, non-tender, normal bowel sounds, no distention, no guarding, no rebound tenderness, no hepatomegaly, and no splenomegaly.  Msk:   Tenderness and mild swelling of left medial mid foot. No erythema, warmth to touch.  Pulses: 2+ DP/PT pulses bilaterally Extremities: No cyanosis, clubbing, edema Neurologic: alert & oriented X3, cranial nerves II-XII intact, strength normal in all extremities, sensation intact to light touch, and gait normal.  Skin: turgor normal and no rashes.  Psych: Oriented X3, memory intact for recent and remote, normally interactive, good eye contact, not anxious appearing, and not depressed appearing.    Assessment & Plan:

## 2013-01-15 NOTE — Patient Instructions (Addendum)
1. Will start you on prednisone 20 mg po daily x 5 days. Then stop 2. Stop your Allopurinol for now ( as he is not taking it anyway).  3. Follow up with me in one week, please bring your meter and ask you wife to come in. 4. Follow up with the clinic in 3-4 weeks. Consider to restart your allopurinol back at least 2-3 weeks after your gout is resolved.

## 2013-01-15 NOTE — Telephone Encounter (Signed)
Pt here at the clinic for Prednisone refill; not on current med list - stopped 3/27 by Dr Obie Dredge. Continues c/o left foot pain.  Appt given for today at 1345PM w/Dr Nicoletta Dress.

## 2013-01-16 ENCOUNTER — Ambulatory Visit (INDEPENDENT_AMBULATORY_CARE_PROVIDER_SITE_OTHER): Payer: Medicare Other | Admitting: Internal Medicine

## 2013-01-16 ENCOUNTER — Encounter: Payer: Self-pay | Admitting: Internal Medicine

## 2013-01-16 VITALS — BP 151/99 | HR 68 | Temp 98.4°F | Ht 68.0 in | Wt 212.6 lb

## 2013-01-16 DIAGNOSIS — E119 Type 2 diabetes mellitus without complications: Secondary | ICD-10-CM | POA: Diagnosis not present

## 2013-01-16 DIAGNOSIS — Z9114 Patient's other noncompliance with medication regimen: Secondary | ICD-10-CM

## 2013-01-16 DIAGNOSIS — Z9119 Patient's noncompliance with other medical treatment and regimen: Secondary | ICD-10-CM | POA: Diagnosis not present

## 2013-01-16 MED ORDER — GLUCOSE BLOOD VI STRP: ORAL_STRIP | Status: DC

## 2013-01-16 MED ORDER — "INSULIN SYRINGE-NEEDLE U-100 31G X 5/16"" 0.5 ML MISC": 1.0000 | Freq: Three times a day (TID) | Status: DC

## 2013-01-16 MED ORDER — LANCETS 30G MISC: Status: DC

## 2013-01-16 MED ORDER — FAMOTIDINE 20 MG PO TABS: 20.0000 mg | ORAL_TABLET | Freq: Two times a day (BID) | ORAL | Status: DC | PRN

## 2013-01-16 NOTE — Assessment & Plan Note (Signed)
Patient has uncontrolled DM with average A1C above 10 for years. The etiology is medical noncompliance.  - detailed instructions on insulin dosage and administration discussed with patient.  - patient voice understanding and states that he will take his insulin as prescribed, - [facility] referral sent.  - will need close monitoring and collaborative team work between the clinic staff, diabetic educator, physicians and [facility].

## 2013-01-16 NOTE — Progress Notes (Signed)
Lotensin is D/C'd by Nephr  Subjective:   Patient ID: Patrick [name] male   DOB: 23-Nov-1945 67 y.o.   MRN: [id number]  HPI: Patrick [name] is a 67 y.o. man with PMH significant for gout, DM, HTN, HLD, chronic kidney disease, and atrial fibrillation on chronic anticoagulation, who presents to clinic for follow up visit to consolidate his home medication and DM management.  Patient was here for gout evaluation yesterday and unable to tell me any details about his home medications including name, dosage and insulin regimen. He is noted to have uncontrolled DM for years 2/2 medical noncompliance. And his CBG was ~ 400 yesterday and he was noted to be hypertensive. I have called his wife several times and the decision was made to bring him back for medication consolidation and DM management.   Face-to-face interview and and discussion is over 40 minutes during this office visit.  Past Medical History  Diagnosis Date  . Diabetes mellitus type II     Insulin dependent  . Hypertension   . Sleep apnea 07/2010  . CKD (chronic kidney disease), stage II   . Paroxysmal atrial fibrillation     on coumadin  . CAD (coronary artery disease)   . Depression   . Gout   . Hyperlipidemia   . 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 aspirin 81 MG EC tablet Take 81 mg by mouth daily.        . cloNIDine (CATAPRES) 0.3 MG tablet Take 1 tablet (0.3 mg total) by mouth 2 (two) times daily.  60 tablet  3  . diltiazem (CARDIZEM) 90 MG tablet Take 1 tablet (90 mg total) by mouth 2 (two) times daily.  180 tablet  2  . famotidine (PEPCID) 20 MG tablet Take 1 tablet (20 mg total) by mouth 2 (two) times daily as needed for heartburn.  62 tablet  5  . furosemide (LASIX) 80 MG tablet Take 1 tablet (80 mg total) by mouth daily.  45 tablet  3  . glucose blood (TRUETRACK TEST) test strip Use to test blood sugar  2-3 times daily dx code 250.00  100 each  11  . Lancets 30G MISC Use to test blood glucose 2-3 times daily dx code 250.00  100 each  11  . Omega-3 Fatty Acids (FISH OIL) 1000 MG CAPS Take 1 capsule by mouth 2 (two) times daily.        . pravastatin (PRAVACHOL) 80 MG tablet Take 1 tablet (80 mg total) by mouth daily.  30 tablet  5  . predniSONE (DELTASONE) 20 MG tablet Take 1 tablet (20 mg total) by mouth daily.  5 tablet  0  . warfarin (COUMADIN) 5 MG tablet Take as directed by anticoagulation clinic provider.  100 tablet  1  . allopurinol (ZYLOPRIM) 100 MG tablet Take 2 tablets (200 mg total) by mouth daily.  60 tablet  6  . Blood Glucose Monitoring Suppl (ACCU-CHEK AVIVA PLUS) W/DEVICE KIT 1 each by Does not apply route as needed.  1 kit  0  . glipiZIDE (GLUCOTROL) 10 MG tablet Take 1.5 tablets (15 mg total) by mouth 2 (two) times daily before a meal.  180 tablet  3  . insulin NPH (NOVOLIN N) 100 UNIT/ML injection Inject 25 Units into the skin at bedtime.  10 mL  6  . Insulin Syringe-Needle U-100 (ELITE-THIN INS SYR .5CC/31G)  31G X 5/16" 0.5 ML MISC 1 each by Does not apply route 3 (three) times daily. Use to inject insulin daily  100 each  11  . potassium chloride (MICRO-K) 10 MEQ CR capsule Take 1 capsule (10 mEq total) by mouth 2 (two) times daily.  60 capsule  3   No current facility-administered medications for this visit.   No family history on file. History   Social History  . Marital Status: Married    [name]    Number of Children: N/A  . Years of Education: N/A   Social History Main Topics  . Smoking status: Former Smoker    Types: Cigarettes    Quit date: 10/26/1976  . Smokeless tobacco: Never Used  . Alcohol Use: No  . Drug Use: No  . Sexually Active: None   Other Topics Concern  . None   Social History Narrative  . None   Review of Systems: Review of Systems:  Constitutional:  Denies fever, chills, diaphoresis, appetite change and fatigue.   HEENT:   Denies congestion, sore throat, rhinorrhea, sneezing, mouth sores, trouble swallowing, neck pain   Respiratory:  Denies SOB, DOE, cough, and wheezing.   Cardiovascular:  Denies palpitations and leg swelling.   Gastrointestinal:  Denies nausea, vomiting, abdominal pain, diarrhea, constipation, blood in stool and abdominal distention.   Genitourinary:  Denies dysuria, urgency, frequency, hematuria, flank pain and difficulty urinating.   Musculoskeletal:  Denies myalgias, back pain, joint swelling, arthralgias and gait problem.   Skin:  Denies pallor, rash and wound.   Neurological:  Denies dizziness, seizures, syncope, weakness, light-headedness, numbness and headaches.    .    Objective:  Physical Exam: Filed Vitals:   01/16/13 1055  BP: 151/99  Pulse: 68  Temp: 98.4 F (36.9 C)  TempSrc: Oral  Height: 5\' 8"  (1.727 m)  Weight: 212 lb 9.6 oz (96.435 kg)  SpO2: 99%   General: alert, well-developed, and cooperative to examination.  Head: normocephalic and atraumatic.  Eyes: vision grossly intact, pupils equal, pupils round, pupils reactive to light, no injection and anicteric.  Mouth: pharynx pink and moist, no erythema, and no exudates.  Neck: supple, full ROM, no thyromegaly, no JVD, and no carotid bruits.  Lungs: normal respiratory effort, no accessory muscle use, normal breath sounds, no crackles, and no wheezes. Heart: normal rate, irregular rhythm, no murmur, no gallop, and no rub.  Abdomen: soft, non-tender, normal bowel sounds, no distention, no guarding, no rebound tenderness, no hepatomegaly, and no splenomegaly.  Msk: Tenderness and mild swelling of left medial mid foot. No erythema, warmth to touch.  Pulses: 2+ DP/PT pulses bilaterally Extremities: No cyanosis, clubbing, edema Neurologic: alert & oriented X3, cranial nerves II-XII intact, strength normal in all extremities, sensation intact to light touch, and gait normal.  Skin: turgor normal and no rashes.  Psych:  Oriented X3, memory intact for recent and remote, normally interactive, good eye contact, not anxious appearing, and not depressed appearing.  Assessment & Plan:

## 2013-01-16 NOTE — Progress Notes (Signed)
Case discussed with Dr. Li soon after the resident saw the patient. We reviewed the resident's history and exam and pertinent patient test results. I agree with the assessment, diagnosis, and plan of care documented in the resident's note. 

## 2013-01-16 NOTE — Assessment & Plan Note (Signed)
The clinical manifestations is consistent with gout.  Septic arthritis is unlikely since he has no systemic symptoms including fever, chills.  Physical examination reveals tenderness and mild swelling of left medial mid foot.  No erythema or warmth to touch.  - Avoid NSAIDs since he has CKD and is on warfarin. - Colchicine will also be avoided due to his CKD. - Will prescribe prednisone 20 mg by mouth daily x 5 days given his elevated blood sugar of 400s in the setting of uncontrolled diabetes 2/2 medical noncompliance. - Patient is instructed to come back to the clinic in one week if his foot pain not better.

## 2013-01-16 NOTE — Assessment & Plan Note (Signed)
Uncontrolled diabetes with hemoglobin A1c ~11 and his CBG of ~400. Denies polydipsia or polyuria.  Denies hypoglycemic events.  Patient did not bring his meter and doesn't know the dosage of insulin he is on.  I have called his wife several times who told me she does not know his medications since " he does it himself".  - Will schedule another appointment tomorrow - We'll ask both patient and wife to come to this appointment - Patient and wife to bring all of his medication bottles including insulin and meter to this appointment.

## 2013-01-16 NOTE — Assessment & Plan Note (Signed)
I reviewed patient's medication bottles and consolidated his medications.  He reports that he is compliant with the most of his daily medications.  And the fact that he has a therapeutic INR on chronic Coumadin therapy supports his story.  However, he does not take cardizem and glipizide twice daily as instructed, and refuses to change both medications to extended release form due to the cost.   With regards to his diabetic management, he is indeed noncompliant with CBG monitoring and insulin regimen.  His glucometer is loaded and reviewed.  There are only 5 CBGs within last month.  He admits only takes 2-3 units NPH twice weekly " because insulin affects my sex ability."  He seems to believe that insulin instead of hyperglycemia causes him to have difficulty in sexual activities.  - Thorough explanation given to patient that hyperglycemia can cause neuropathy and arthrosclerosis, both of which can affect the sexual function. Patient voice understanding.  - patient is instructed to take his insulin as prescribed. One year refill of lancet, strips and syringes is sent to his pharmacy. - patient is instructed to follow up with DM educator monthly. I have discussed with Butch Penny about the further management of his DM.  - patient is also instructed to see his PCP monthly - [facility] referral sent as he needs a lot of outpatient monitoring for medications and compliance.

## 2013-01-16 NOTE — Patient Instructions (Addendum)
1. Will give you one year refill on your lancet, strips and syringes 2. Will send [facility] referral.  3. Please take your insulin as prescribed 4. Will need monthly follow up with the clinic with Butch Penny and physician

## 2013-01-19 NOTE — Progress Notes (Signed)
Case discussed with Dr. Li soon after the resident saw the patient. We reviewed the resident's history and exam and pertinent patient test results. I agree with the assessment, diagnosis, and plan of care documented in the resident's note. 

## 2013-01-22 ENCOUNTER — Telehealth: Payer: Self-pay | Admitting: Licensed Clinical Social Worker

## 2013-01-22 NOTE — Telephone Encounter (Signed)
Mr. Matsen was referred to CSW for [facility] referral.  However, pt has been referred and d/c from [facility] due to refusal to participate in program.  CSW placed call to Mr. Khatoon to determine if pt is ready to participate in care management.  CSW left message requesting return call. CSW provided contact hours and phone number.

## 2013-01-26 NOTE — Telephone Encounter (Signed)
CSW placed call to pt.  Pt sleeping and family member answered phone.  Spouse states pt is not interested in [facility] care management at this time.  Spouse aware care management is available when pt is ready to participate with services.

## 2013-02-02 ENCOUNTER — Ambulatory Visit (INDEPENDENT_AMBULATORY_CARE_PROVIDER_SITE_OTHER): Payer: Medicare Other | Admitting: Pharmacist

## 2013-02-02 DIAGNOSIS — I4891 Unspecified atrial fibrillation: Secondary | ICD-10-CM

## 2013-02-02 DIAGNOSIS — Z7901 Long term (current) use of anticoagulants: Secondary | ICD-10-CM

## 2013-02-02 LAB — POCT INR: INR: 2.8

## 2013-02-02 NOTE — Progress Notes (Signed)
Anti-Coagulation Progress Note  Patrick [name] is a 67 y.o. male who is currently on an anti-coagulation regimen.    RECENT RESULTS: Recent results are below, the most recent result is correlated with a dose of 27.5 mg. per week: Lab Results  Component Value Date   INR 2.80 02/02/2013   INR 2.80 01/05/2013   INR 2.30 12/08/2012    ANTI-COAG DOSE: Anticoagulation Dose Instructions as of 02/02/2013     [name] Tue Wed Thu Fri Sat   New Dose 5 mg 2.5 mg 5 mg 2.5 mg 5 mg 2.5 mg 5 mg       ANTICOAG SUMMARY: Anticoagulation Episode Summary   Current INR goal 2.0-3.0  Next INR check 03/02/2013  INR from last check 2.80 (02/02/2013)  Weekly max dose   Target end date Indefinite  INR check location   Preferred lab   Send INR reminders to ANTICOAG IMP   Indications  ATRIAL FIBRILLATION PAROXYSMAL CHRONIC [427.31] Long term current use of anticoagulant [V58.61]        Comments       Anticoagulation Care Providers   Provider Role Specialty Phone number   Bartholomew Crews, MD  Internal Medicine [phone]      ANTICOAG TODAY: Anticoagulation Summary as of 02/02/2013   INR goal 2.0-3.0  Selected INR 2.80 (02/02/2013)  Next INR check 03/02/2013  Target end date Indefinite   Indications  ATRIAL FIBRILLATION PAROXYSMAL CHRONIC [427.31] Long term current use of anticoagulant [V58.61]      Anticoagulation Episode Summary   INR check location    Preferred lab    Send INR reminders to ANTICOAG IMP   Comments     Anticoagulation Care Providers   Provider Role Specialty Phone number   Bartholomew Crews, MD  Internal Medicine [phone]      PATIENT INSTRUCTIONS: Patient Instructions  Patient instructed to take medications as defined in the Anti-coagulation Track section of this encounter.  Patient instructed to take today's dose.  Patient verbalized understanding of these instructions.       FOLLOW-UP Return in 4 weeks (on 03/02/2013) for Follow up INR at 2:45PM.  [name], III Pharm.D., CACP

## 2013-02-02 NOTE — Patient Instructions (Signed)
Patient instructed to take medications as defined in the Anti-coagulation Track section of this encounter.  Patient instructed to take today's dose.  Patient verbalized understanding of these instructions.    

## 2013-02-05 ENCOUNTER — Other Ambulatory Visit: Payer: Self-pay

## 2013-02-12 ENCOUNTER — Ambulatory Visit (INDEPENDENT_AMBULATORY_CARE_PROVIDER_SITE_OTHER): Payer: Medicare Other | Admitting: Internal Medicine

## 2013-02-12 ENCOUNTER — Ambulatory Visit (INDEPENDENT_AMBULATORY_CARE_PROVIDER_SITE_OTHER): Payer: Medicare Other | Admitting: Dietician

## 2013-02-12 ENCOUNTER — Encounter: Payer: Self-pay | Admitting: Internal Medicine

## 2013-02-12 VITALS — BP 133/78 | HR 50 | Temp 98.2°F | Wt 213.1 lb

## 2013-02-12 DIAGNOSIS — E1165 Type 2 diabetes mellitus with hyperglycemia: Secondary | ICD-10-CM | POA: Diagnosis not present

## 2013-02-12 DIAGNOSIS — I10 Essential (primary) hypertension: Secondary | ICD-10-CM | POA: Diagnosis not present

## 2013-02-12 DIAGNOSIS — M109 Gout, unspecified: Secondary | ICD-10-CM | POA: Diagnosis not present

## 2013-02-12 DIAGNOSIS — E785 Hyperlipidemia, unspecified: Secondary | ICD-10-CM | POA: Diagnosis not present

## 2013-02-12 DIAGNOSIS — E118 Type 2 diabetes mellitus with unspecified complications: Secondary | ICD-10-CM

## 2013-02-12 MED ORDER — FUROSEMIDE 80 MG PO TABS: 80.0000 mg | ORAL_TABLET | Freq: Two times a day (BID) | ORAL | Status: DC

## 2013-02-12 NOTE — Assessment & Plan Note (Signed)
Assessment:  The patients foot pain was likely secondary to gout. The patient currently has minimal pain and is much improved since his last visit where he received prednisone and colchicine.   Plan:  We continued the patients allopurinol to prevent future attacks of gout.

## 2013-02-12 NOTE — Patient Instructions (Addendum)
Please make a follow up appointment in 1 month- be sure to check your blood sugar and bring your meter  Be sure you are getting 25 units of insulin each evening.   You can draw up 7 syringes at a time and store in a cup or glass with needles capped and pointing up.  Be sure you roll the vial before drawing the insulin up into the syringe AND be sure to roll each syringe before you inject the insulin in your body.   Insulin Storage and Care All insulin pens and bottles (vials) have expiration dates. Refrigerated, unopened insulin pens and vials are good until the expiration date. Do not use insulin after this date. Once insulin is opened, it should be thrown away at the appropriate time. The table below will help guide you. INSULINS    Humulin N (NPH)  Vials: 28 days          CARING FOR YOUR INSULIN  Opened insulin pens should be kept at room temperature.    Store insulin NOT in use in the refrigerator, between 36-46 F .  Do not freeze insulin.  Keep insulin away from direct heat or sunlight.  Throw away the insulin if it is discolored, thick or has clumps, or suspended white particles.  Be sure to mix "cloudy" insulins by rolling gently. Pens can be "rocked" from end to end.  Always have extra insulin on hand.

## 2013-02-12 NOTE — Progress Notes (Signed)
Diabetes Self-Management Education  Visit Number: First/Initial  02/12/2013 [name], identified by name and date of birth, is a 67 y.o. male with Type 2 Diabetes.  Other people present during visit:  Spouse/SO   ASSESSMENT  Patient Concerns:  Other (comment) (Patient did not seem to have concerns about his blood sugar )   Lab Results: LDL Cholesterol  Date Value Range Status  12/14/2011 61  0 - 99 mg/dL Final           Hemoglobin A1C  Date Value Range Status  01/15/2013 11.3   Final     No family history on file. History  Substance Use Topics  . Smoking status: Former Smoker    Types: Cigarettes    Quit date: 10/26/1976  . Smokeless tobacco: Never Used  . Alcohol Use: No    Support Systems:  Spouse Special Needs:  Large print (Difficulty seeing)  Prior DM Education:   Daily Foot Exams:   Patient Belief / Attitude about Diabetes:  Other (comment) (Patient seemed apathetic towards his disease and care of it.)  Assessment comments: Patient has been noncompliant with medication due to physical difficulties as well as apathy towards disease management. Wife says that he has recently started taking his insulin (since last PCP visit one month ago) and upon having patient demonstrate how he draws insulin while in the office today, the patient struggled some with drawing it up and also drew the incorrect amount (17 units vs 25 units). I checked the Patient's Medicare Part D coverage of insulin pens, and patient would need to pay 15% of the cost of the pens, which would cost significantly more then the cost of the patient's current insulin regimine (which costs the patient $4). Patient also does not take glucotrol until 5 or 6 pm in the evening, medicine is prescribed bid, it does not seem that patient is taking glucotrol bid as prescribed, patient may benefit from an extended release sulfonylurea instead. Patient reports urinating frequently at night, which interrupts his sleep,  and patient also drinks a lot of fluids including water, sweet tea and juice.   Diet Recall: Patient does not wake until between 11:00-1:00 during the day. He eats 1/4 a watermelon. He and his spouse went to [employer] at 3:00 and ate breaded fish, macaroni and cheese, and Dumplings with multiple refills of 10oz sweet tea. Patient regularly drinks sweet tea, juice and water at home.    Individualized Plan for Diabetes Self-Management Training:  Discussed with patient drawing up insulin into 7 syringes for the week with his spouse's help to ensure accurate doses. Gave handout with instructions on how to do this and how to store the syringes in the refrigerator.  Education Topics Reviewed with Patient Today:  Topic Points Discussed  Disease State    Nutrition Management    Physical Activity and Exercise    Medications Taught/evaluated insulin injection, site rotation, insulin storage and needle disposal.;Reviewed patients medication for diabetes, action, purpose, timing of dose and side effects.  Monitoring Yearly dilated eye exam  Acute Complications    Chronic Complications    Psychosocial Adjustment Helped patient identify a support system for diabetes management  Goal Setting    Preconception Care (if applicable)      PATIENTS GOALS   Learning Objective(s):     Goal The patient agrees to:  Nutrition    Physical Activity    Medications    Working with spouse to accurately dose insulin  Monitoring  Problem Solving    Reducing Risk    Health Coping     Patient Self-Evaluation of Goals (Subsequent Visits)  Goal The patient rates self as meeting goals (% of time)  Nutrition    Physical Activity    Medications   Monitoring    Problem Solving    Reducing Risk    Health Coping      PERSONALIZED PLAN / SUPPORT  Self-Management Support:  [facility] office;Family;CDE visits ______________________________________________________________________  Outcomes  Expected  Outcomes:  Demonstrated limited interest in learning.  Expect minimal changes Self-Care Barriers:  Impaired vision Education material provided: the after visit summary  If problems or questions, patient to contact team via:  Phone Time in: 1340     Time out: 1430 Future DSME appointment: - 4-6 wks   Plyler, Butch Penny 02/12/2013 3:29 PM

## 2013-02-12 NOTE — Progress Notes (Signed)
Subjective:   Patient ID: Patrick [name] male   DOB: Oct 24, 1945 67 y.o.   MRN: [id number]  HPI: Patrick [name] is a 67 y.o. male with a PMH detailed below presents for re-evaluation of chronic medical problems. See problem based charting for details.    Past Medical History  Diagnosis Date  . Diabetes mellitus type II     Insulin dependent  . Hypertension   . Sleep apnea 07/2010  . CKD (chronic kidney disease), stage II   . Paroxysmal atrial fibrillation     on coumadin  . CAD (coronary artery disease)   . Depression   . Gout   . Hyperlipidemia   . 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 allopurinol (ZYLOPRIM) 100 MG tablet Take 2 tablets (200 mg total) by mouth daily.  60 tablet  6  . aspirin 81 MG EC tablet Take 81 mg by mouth daily.        . Blood Glucose Monitoring Suppl (ACCU-CHEK AVIVA PLUS) W/DEVICE KIT 1 each by Does not apply route as needed.  1 kit  0  . cloNIDine (CATAPRES) 0.3 MG tablet Take 1 tablet (0.3 mg total) by mouth 2 (two) times daily.  60 tablet  3  . diltiazem (CARDIZEM) 90 MG tablet Take 1 tablet (90 mg total) by mouth 2 (two) times daily.  180 tablet  2  . famotidine (PEPCID) 20 MG tablet Take 1 tablet (20 mg total) by mouth 2 (two) times daily as needed for heartburn.  62 tablet  5  . furosemide (LASIX) 80 MG tablet Take 1 tablet (80 mg total) by mouth daily.  45 tablet  3  . glipiZIDE (GLUCOTROL) 10 MG tablet Take 1.5 tablets (15 mg total) by mouth 2 (two) times daily before a meal.  180 tablet  3  . glucose blood (TRUETRACK TEST) test strip Use to test blood sugar 2-3 times daily dx code 250.00  100 each  11  . insulin NPH (NOVOLIN N) 100 UNIT/ML injection Inject 25 Units into the skin at bedtime.  10 mL  6  . Insulin Syringe-Needle U-100 (ELITE-THIN INS SYR .5CC/31G) 31G X 5/16" 0.5 ML MISC 1 each by Does not apply route 3 (three) times  daily. Use to inject insulin daily  100 each  11  . Lancets 30G MISC Use to test blood glucose 2-3 times daily dx code 250.00  100 each  11  . Omega-3 Fatty Acids (FISH OIL) 1000 MG CAPS Take 1 capsule by mouth 2 (two) times daily.        . potassium chloride (MICRO-K) 10 MEQ CR capsule Take 1 capsule (10 mEq total) by mouth 2 (two) times daily.  60 capsule  3  . pravastatin (PRAVACHOL) 80 MG tablet Take 1 tablet (80 mg total) by mouth daily.  30 tablet  5  . predniSONE (DELTASONE) 20 MG tablet Take 1 tablet (20 mg total) by mouth daily.  5 tablet  0  . warfarin (COUMADIN) 5 MG tablet Take as directed by anticoagulation clinic provider.  100 tablet  1   No current facility-administered medications for this visit.   No family history on file. History   Social History  . Marital Status: Married    [name]    Number of Children: N/A  . Years of Education: N/A   Social History Main Topics  .  Smoking status: Former Smoker    Types: Cigarettes    Quit date: 10/26/1976  . Smokeless tobacco: Never Used  . Alcohol Use: No  . Drug Use: No  . Sexually Active: Not on file   Other Topics Concern  . Not on file   Social History Narrative  . No narrative on file   Review of Systems:  Review of Systems  Constitutional: Negative for fever and chills.  HENT: Negative for sore throat.   Eyes: Positive for blurred vision. Negative for double vision and photophobia.  Respiratory: Negative for cough, shortness of breath and wheezing.   Cardiovascular: Negative for chest pain, palpitations and leg swelling.  Gastrointestinal: Negative for heartburn, nausea, vomiting and abdominal pain.  Skin: Negative for rash.  Neurological: Negative for weakness and headaches.  Psychiatric/Behavioral: Positive for depression.     Objective:  Physical Exam: Filed Vitals:   02/12/13 1438  BP: 133/78  Pulse: 50  Temp: 98.2 F (36.8 C)  TempSrc: Oral  Weight: 213 lb 1.6 oz (96.662 kg)    SpO2: 99%   Physical Exam  Constitutional: He is oriented to person, place, and time. He appears well-developed and well-nourished. No distress.  HENT:  Head: Normocephalic and atraumatic.  Mucosa of the mouth pink and moist  Eyes: EOM are normal. Pupils are equal, round, and reactive to light.  Neck: Normal range of motion. Neck supple.  Cardiovascular: Normal rate, regular rhythm, normal heart sounds and intact distal pulses.  Exam reveals no gallop and no friction rub.   No murmur heard. Pulmonary/Chest: Effort normal and breath sounds normal. No respiratory distress. He has no wheezes. He has no rales.  Abdominal: Soft. Bowel sounds are normal.  Neurological: He is alert and oriented to person, place, and time.  Skin: He is not diaphoretic.  Psychiatric:  Depressed mood and affect. Difficult to engage.    Assessment & Plan:

## 2013-02-12 NOTE — Assessment & Plan Note (Addendum)
Lab Results  Component Value Date   HGBA1C 11.3 01/15/2013   HGBA1C 10.2 09/23/2012   HGBA1C 7.9 05/28/2012    CBG (last 3)   Recent Labs  02/12/13 1449  GLUCAP 136*      Assessment: Diabetes control: poor control (HgbA1C >9%) Progress toward A1C goal:  unchanged Comments: The patient has made large improvements in terms of his DM II in the last month. He states that he has started using his insulin, which he had stopped for a long time. He also reports that he has decided to take his other medicines. Today he met with our diabetes educator to help understand and make a plan for his diabetes management.  Plan: Medications:  continue current medications Home glucose monitoring: Frequency: once a day Timing: before breakfast Instruction/counseling given: reminded to get eye exam, reminded to bring blood glucose meter & log to each visit, reminded to bring medications to each visit, discussed foot care and discussed diet Educational resources provided: brochure Self management tools provided: home glucose logbook Other plans: We plan to follow up with the patient in 1 month. Our diabetes educator has suggested that the patient will likely have better compliance with his insulin regimen if he were to use insuline pens. I agree with this plan pending Donna's acquisition of information regarding cost. Further, she suggested that we consider extended release glipizide at his next visit to simplify his regimen.

## 2013-02-12 NOTE — Assessment & Plan Note (Signed)
BP Readings from Last 3 Encounters:  02/12/13 133/78  01/16/13 151/99  01/15/13 170/100    Lab Results  Component Value Date   NA 142 09/23/2012   K 3.6 09/23/2012   CREATININE 2.08* 09/23/2012    Assessment: Blood pressure control: controlled Progress toward BP goal:  at goal Comments: The patient is well controlled on this current regimen.  Plan: Medications:  continue current medications Educational resources provided: brochure;video

## 2013-02-12 NOTE — Assessment & Plan Note (Signed)
Lipid Panel     Component Value Date/Time   CHOL 136 12/14/2011 1515   TRIG 107 12/14/2011 1515   HDL 54 12/14/2011 1515   CHOLHDL 2.5 12/14/2011 1515   VLDL 21 12/14/2011 1515   LDLCALC 61 12/14/2011 1515   A: The patient's LDL was well controlled with medical mangment, but is due for a recheck today.  P: We plan to re-evaluate his statin therapy once the results of the lipid panel are available. The goal LDL will be <100.

## 2013-02-12 NOTE — Patient Instructions (Addendum)
General Instructions:  Continue to take medications  Follow up with PCP in 1 months.  Treatment Goals:  Goals (1 Years of Data) as of 02/12/13         As of Today 01/16/13 01/15/13 01/15/13 10/23/12     Blood Pressure    . Blood Pressure < 140/90  133/78 151/99 170/100 193/106 109/64     Result Component    . HEMOGLOBIN A1C < 7.0    11.3      . LDL CALC < 100            Progress Toward Treatment Goals:  Treatment Goal 02/12/2013  Hemoglobin A1C unchanged  Blood pressure at goal    Self Care Goals & Plans:  Self Care Goal 02/12/2013  Manage my medications take my medicines as prescribed; bring my medications to every visit; refill my medications on time  Monitor my health bring my glucose meter and log to each visit; keep track of my blood glucose  Eat healthy foods eat baked foods instead of fried foods; eat foods that are low in salt  Be physically active find an activity I enjoy       Care Management & Community Referrals:     Referred to ophthalmologist for eye exam.   Low Blood Sugar Low blood sugar (hypoglycemia) means that the level of sugar in your blood is lower than it should be. Signs of low blood sugar include:  Getting sweaty.  Feeling hungry.  Feeling dizzy or weak.  Feeling sleepier than normal.  Feeling nervous.  Headaches.  Having a fast heartbeat. Low blood sugar can happen fast and can be an emergency. Your doctor can do tests to check your blood sugar level. You can have low blood sugar and not have diabetes. HOME CARE  Check your blood sugar as told by your doctor. If it is less than 70 mg/dl or as told by your doctor, take 1 of the following:  3 to 4 glucose tablets.   cup clear juice.   cup soda pop, not diet.  1 cup milk.  5 to 6 hard candies.  Recheck blood sugar after 15 minutes. Repeat until it is at the right level.  Eat a snack if it is more than 1 hour until the next meal.  Only take medicine as told by your  doctor.  Do not skip meals. Eat on time.  Do not drink alcohol except with meals.  Check your blood glucose before driving.  Check your blood glucose before and after exercise.  Always carry treatment with you, such as glucose pills.  Always wear a medical alert bracelet if you have diabetes. GET HELP RIGHT AWAY IF:   Your blood glucose goes below 70 mg/dl or as told by your doctor, and you:  Are confused.  Are not able to swallow.  Pass out (faint).  You cannot treat yourself. You may need someone to help you.  You have low blood sugar problems often.  You have problems from your medicines.  You are not feeling better after 3 to 4 days.  You have vision changes. MAKE SURE YOU:   Understand these instructions.  Will watch this condition.  Will get help right away if you are not doing well or get worse. Document Released: 10/10/2009 Document Revised: 10/08/2011 Document Reviewed: 10/10/2009 [facility] Patient Information 2014 [place], [place].

## 2013-02-13 LAB — LIPID PANEL
Cholesterol: 139 mg/dL (ref 0–200)
HDL: 47 mg/dL (ref >39)
Total CHOL/HDL Ratio: 3 Ratio
VLDL: 24 mg/dL (ref 0–40)

## 2013-02-16 NOTE — Progress Notes (Signed)
I saw and evaluated the patient.  I personally confirmed the key portions of the history and exam documented by Dr. Komanski and I reviewed pertinent patient test results.  The assessment, diagnosis, and plan were formulated together and I agree with the documentation in the resident's note.  

## 2013-03-02 ENCOUNTER — Ambulatory Visit (INDEPENDENT_AMBULATORY_CARE_PROVIDER_SITE_OTHER): Payer: Medicare Other | Admitting: Pharmacist

## 2013-03-02 DIAGNOSIS — Z7901 Long term (current) use of anticoagulants: Secondary | ICD-10-CM

## 2013-03-02 DIAGNOSIS — I4891 Unspecified atrial fibrillation: Secondary | ICD-10-CM | POA: Diagnosis not present

## 2013-03-02 LAB — POCT INR: INR: 2.9

## 2013-03-02 MED ORDER — WARFARIN SODIUM 5 MG PO TABS: ORAL_TABLET | ORAL | Status: DC

## 2013-03-02 NOTE — Patient Instructions (Signed)
Patient instructed to take medications as defined in the Anti-coagulation Track section of this encounter.  Patient instructed to take today's dose.  Patient verbalized understanding of these instructions.    

## 2013-03-02 NOTE — Progress Notes (Signed)
Anti-Coagulation Progress Note  Patrick [name] is a 67 y.o. male who is currently on an anti-coagulation regimen.    RECENT RESULTS: Recent results are below, the most recent result is correlated with a dose of 27.5 mg. per week: Lab Results  Component Value Date   INR 2.90 03/02/2013   INR 2.80 02/02/2013   INR 2.80 01/05/2013    ANTI-COAG DOSE: Anticoagulation Dose Instructions as of 03/02/2013     [name] Tue Wed Thu Fri Sat   New Dose 5 mg 2.5 mg 2.5 mg 2.5 mg 5 mg 2.5 mg 5 mg       ANTICOAG SUMMARY: Anticoagulation Episode Summary   Current INR goal 2.0-3.0  Next INR check 03/23/2013  INR from last check 2.90 (03/02/2013)  Weekly max dose   Target end date Indefinite  INR check location   Preferred lab   Send INR reminders to ANTICOAG IMP   Indications  ATRIAL FIBRILLATION PAROXYSMAL CHRONIC [427.31] Long term current use of anticoagulant [V58.61]        Comments       Anticoagulation Care Providers   Provider Role Specialty Phone number   Bartholomew Crews, MD  Internal Medicine [phone]      ANTICOAG TODAY: Anticoagulation Summary as of 03/02/2013   INR goal 2.0-3.0  Selected INR 2.90 (03/02/2013)  Next INR check 03/23/2013  Target end date Indefinite   Indications  ATRIAL FIBRILLATION PAROXYSMAL CHRONIC [427.31] Long term current use of anticoagulant [V58.61]      Anticoagulation Episode Summary   INR check location    Preferred lab    Send INR reminders to ANTICOAG IMP   Comments     Anticoagulation Care Providers   Provider Role Specialty Phone number   Bartholomew Crews, MD  Internal Medicine [phone]      PATIENT INSTRUCTIONS: Patient Instructions  Patient instructed to take medications as defined in the Anti-coagulation Track section of this encounter.  Patient instructed to take today's dose.  Patient verbalized understanding of these instructions.       FOLLOW-UP Return in 3 weeks (on 03/23/2013) for Follow up INR at  3:15PM.  [name], III Pharm.D., CACP

## 2013-03-03 ENCOUNTER — Other Ambulatory Visit: Payer: Self-pay | Admitting: Internal Medicine

## 2013-03-10 ENCOUNTER — Ambulatory Visit (INDEPENDENT_AMBULATORY_CARE_PROVIDER_SITE_OTHER): Payer: Medicare Other | Admitting: Internal Medicine

## 2013-03-10 ENCOUNTER — Encounter: Payer: Self-pay | Admitting: Internal Medicine

## 2013-03-10 VITALS — BP 173/105 | HR 75 | Temp 99.6°F | Wt 210.5 lb

## 2013-03-10 DIAGNOSIS — I10 Essential (primary) hypertension: Secondary | ICD-10-CM

## 2013-03-10 DIAGNOSIS — E118 Type 2 diabetes mellitus with unspecified complications: Secondary | ICD-10-CM

## 2013-03-10 DIAGNOSIS — Z9114 Patient's other noncompliance with medication regimen: Secondary | ICD-10-CM

## 2013-03-10 DIAGNOSIS — Z9119 Patient's noncompliance with other medical treatment and regimen: Secondary | ICD-10-CM | POA: Diagnosis not present

## 2013-03-10 DIAGNOSIS — E1165 Type 2 diabetes mellitus with hyperglycemia: Secondary | ICD-10-CM | POA: Diagnosis not present

## 2013-03-10 LAB — GLUCOSE, CAPILLARY: Glucose-Capillary: 119 mg/dL — ABNORMAL HIGH (ref 70–99)

## 2013-03-10 MED ORDER — GLIPIZIDE ER 10 MG PO TB24: 10.0000 mg | ORAL_TABLET | Freq: Every day | ORAL | Status: DC

## 2013-03-10 NOTE — Assessment & Plan Note (Signed)
BP Readings from Last 3 Encounters:  03/10/13 173/105  02/12/13 133/78  01/16/13 151/99    Lab Results  Component Value Date   NA 142 09/23/2012   K 3.6 09/23/2012   CREATININE 2.08* 09/23/2012   The patient does not appear to have the capacity to be able to take his medications as prescribed. This is likely 2/2 to CVA. As the patient lives with his wife and has four sons, he will likely need help from his family members in order to adhere with his medications.  Assessment: Blood pressure control: moderately elevated Progress toward BP goal:  deteriorated Comments: The patients elevated BP is likely due to medication non-compliance.  Plan: Medications:  continue current medications Other plans: See the plan for DM II from this visit for detailed plan regarding medication non-compliance.

## 2013-03-10 NOTE — Progress Notes (Signed)
Subjective:   Patient ID: Patrick [name] male   DOB: [birth year] 67 y.o.   MRN: [id number]  HPI: Patrick [name] is a 67 y.o. man with a pmhx detailed below who comes to the clinic for 1 month f/u of DM II. See Problem based charting.    Past Medical History  Diagnosis Date  . Diabetes mellitus type II     Insulin dependent  . Hypertension   . Sleep apnea 07/2010  . CKD (chronic kidney disease), stage II   . Paroxysmal atrial fibrillation     on coumadin  . CAD (coronary artery disease)   . Depression   . Gout   . Hyperlipidemia   . 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 allopurinol (ZYLOPRIM) 100 MG tablet Take 2 tablets (200 mg total) by mouth daily.  60 tablet  6  . aspirin 81 MG EC tablet Take 81 mg by mouth daily.        . Blood Glucose Monitoring Suppl (ACCU-CHEK AVIVA PLUS) W/DEVICE KIT 1 each by Does not apply route as needed.  1 kit  0  . cloNIDine (CATAPRES) 0.3 MG tablet Take 1 tablet (0.3 mg total) by mouth 2 (two) times daily.  60 tablet  3  . diltiazem (CARDIZEM) 90 MG tablet Take 1 tablet (90 mg total) by mouth 2 (two) times daily.  180 tablet  2  . famotidine (PEPCID) 20 MG tablet Take 1 tablet (20 mg total) by mouth 2 (two) times daily as needed for heartburn.  62 tablet  5  . furosemide (LASIX) 80 MG tablet Take 1 tablet (80 mg total) by mouth 2 (two) times daily.  60 tablet  6  . glipiZIDE (GLUCOTROL) 10 MG tablet TAKE ONE & ONE-HALF TABLETS BY MOUTH TWICE DAILY BEFORE MEALS  180 tablet  0  . glucose blood (TRUETRACK TEST) test strip Use to test blood sugar 2-3 times daily dx code 250.00  100 each  11  . insulin NPH (NOVOLIN N) 100 UNIT/ML injection Inject 25 Units into the skin at bedtime.  10 mL  6  . Insulin Syringe-Needle U-100 (ELITE-THIN INS SYR .5CC/31G) 31G X 5/16" 0.5 ML MISC 1 each by Does not apply route 3 (three) times daily. Use to  inject insulin daily  100 each  11  . Lancets 30G MISC Use to test blood glucose 2-3 times daily dx code 250.00  100 each  11  . Omega-3 Fatty Acids (FISH OIL) 1000 MG CAPS Take 1 capsule by mouth 2 (two) times daily.        . pravastatin (PRAVACHOL) 80 MG tablet Take 1 tablet (80 mg total) by mouth daily.  30 tablet  5  . warfarin (COUMADIN) 5 MG tablet Take 1/2 tablets Mon/Tues/Wed/Friday; Take 1 tablet Sun/Thurs/Sat.  30 tablet  2  . potassium chloride (MICRO-K) 10 MEQ CR capsule Take 1 capsule (10 mEq total) by mouth 2 (two) times daily.  60 capsule  3   No current facility-administered medications for this visit.   No family history on file. History   Social History  . Marital Status: Married    [name]    Number of Children: N/A  . Years of Education: N/A   Social History Main Topics  . Smoking status: Former Smoker    Types: Cigarettes    Quit date: 10/26/1976  . Smokeless  tobacco: Never Used  . Alcohol Use: No  . Drug Use: No  . Sexually Active: Not on file   Other Topics Concern  . Not on file   Social History Narrative  . No narrative on file   Review of Systems: Review of Systems  Constitutional: Negative for fever, chills and diaphoresis.  HENT: Negative for sore throat.   Eyes: Negative for blurred vision and double vision.  Respiratory: Negative for cough and shortness of breath.   Cardiovascular: Negative for chest pain, palpitations and leg swelling.  Gastrointestinal: Negative for heartburn, nausea, vomiting, diarrhea and constipation.  Genitourinary: Negative for dysuria.  Skin: Negative for rash.  Neurological: Negative for weakness and headaches.    Objective:  Physical Exam: Filed Vitals:   03/10/13 1401  BP: 186/107  Pulse: 76  Temp: 99.6 F (37.6 C)  TempSrc: Oral  Weight: 210 lb 8 oz (95.482 kg)  SpO2: 98%   Physical Exam  Constitutional: He appears well-developed and well-nourished. No distress.  HENT:  Head: Normocephalic  and atraumatic.  Mouth/Throat: Oropharynx is clear and moist. No oropharyngeal exudate.  Eyes: EOM are normal. Pupils are equal, round, and reactive to light.  Neck: Neck supple. No thyromegaly present.  Cardiovascular: Normal rate, regular rhythm and normal heart sounds.  Exam reveals no gallop and no friction rub.   No murmur heard. Pulmonary/Chest: Effort normal and breath sounds normal. No respiratory distress. He has no wheezes.  Lymphadenopathy:    He has no cervical adenopathy.  Skin: He is not diaphoretic.  Psychiatric:  Depressed mood and affect. Slow Speech. Minimal interaction. Trouble with participating in conversation.     Assessment & Plan:

## 2013-03-10 NOTE — Patient Instructions (Addendum)
Stop taking glipizide 5 mg twice a day.  Start taking glipizide extended release 10 mg once a day.  Follow up with outpatient clinic in the morning with your wife (4 weeks)

## 2013-03-10 NOTE — Assessment & Plan Note (Signed)
The patient continues to have severe problems with medication compliance. See DM II plan for detailed plan for addressing medication non-compliance.

## 2013-03-10 NOTE — Assessment & Plan Note (Addendum)
Lab Results  Component Value Date   HGBA1C 11.3 01/15/2013   HGBA1C 10.2 09/23/2012   HGBA1C 7.9 05/28/2012    The patient is unable to report how he takes his insulin. He reports that he takes all of his medicines once a day typically during the afternoon. The patient does not appear to have the capacity to be able to take his medications as prescribed. This is likely 2/2 to CVA. As the patient lives with his wife and has four sons, he will likely need help from his family members in order to adhere with his medications.  Assessment: Diabetes control: poor control (HgbA1C >9%) Progress toward A1C goal:  unchanged Comments: Unable to be medically compliant 2/2 to neurologic defecit  Plan: Medications:  Swithc from glipizide bid to qd extended release. Home glucose monitoring: Frequency: once a day Timing:   Instruction/counseling given: reminded to bring blood glucose meter & log to each visit, reminded to bring medications to each visit and discussed diet Educational resources provided:   Self management tools provided: copy of home glucose meter download Other plans: Plan for the patient to return to follow-up in 4 weeks with his wife. I recommend that he receiving detailed consouling about his medication non compliance and how this is likely a result of the CVA. At that time, I suggest setting up a home health nurse who will manage a pill box for the patient. Furthermore, the patient cannot afford insulin pens. Thus, I recommend that a home health nurse draw and store the patients insulin on a weekly basis. This plan needs to be communicated with the patient and his wife. The appointment should take place in the morning so that the patient will be able to meet with the SW .  Furthermore, the patient missed his appt with his ophthalmologist. We have rescheduled him with a new appt.

## 2013-03-13 NOTE — Progress Notes (Signed)
I saw and evaluated the patient.  I personally confirmed the key portions of the history and exam documented by Dr. Margart Sickles and I reviewed pertinent patient test results.  The assessment, diagnosis, and plan were formulated together and I agree with the documentation in the resident's note.  I agree with Dr. Laroy Apple evaluation.  Mr. Hesley will need significant help and simplification of his medications to ensure compliance.  If at all possible, would change his medications to once a day dosing.  Would also recommend LCSW evaluation for possible community resources.

## 2013-03-17 ENCOUNTER — Telehealth: Payer: Self-pay | Admitting: Dietician

## 2013-03-17 NOTE — Telephone Encounter (Addendum)
Did not keep appointment on 8/5/143 at [facility]. Suggest retinal scan here if he qualifies. Per our chart his last exam was 2011 and there was no retinopathy. He does qualify. Has an appointment with Dr. Elie Confer on 8/25 and Dr. Redmond Pulling on 9/914- suggest we do a retinal scan at one of those visits.

## 2013-03-23 ENCOUNTER — Ambulatory Visit (INDEPENDENT_AMBULATORY_CARE_PROVIDER_SITE_OTHER): Payer: Medicare Other | Admitting: Pharmacist

## 2013-03-23 ENCOUNTER — Ambulatory Visit (INDEPENDENT_AMBULATORY_CARE_PROVIDER_SITE_OTHER): Payer: Medicare Other | Admitting: Internal Medicine

## 2013-03-23 ENCOUNTER — Other Ambulatory Visit (INDEPENDENT_AMBULATORY_CARE_PROVIDER_SITE_OTHER): Payer: Medicare Other

## 2013-03-23 ENCOUNTER — Encounter: Payer: Self-pay | Admitting: Internal Medicine

## 2013-03-23 VITALS — BP 187/102 | HR 63 | Temp 97.6°F | Ht 68.0 in | Wt 210.9 lb

## 2013-03-23 DIAGNOSIS — E119 Type 2 diabetes mellitus without complications: Secondary | ICD-10-CM

## 2013-03-23 DIAGNOSIS — E1129 Type 2 diabetes mellitus with other diabetic kidney complication: Secondary | ICD-10-CM

## 2013-03-23 DIAGNOSIS — M109 Gout, unspecified: Secondary | ICD-10-CM

## 2013-03-23 DIAGNOSIS — E1165 Type 2 diabetes mellitus with hyperglycemia: Secondary | ICD-10-CM

## 2013-03-23 DIAGNOSIS — I10 Essential (primary) hypertension: Secondary | ICD-10-CM

## 2013-03-23 DIAGNOSIS — I4891 Unspecified atrial fibrillation: Secondary | ICD-10-CM | POA: Diagnosis not present

## 2013-03-23 DIAGNOSIS — Z7901 Long term (current) use of anticoagulants: Secondary | ICD-10-CM | POA: Diagnosis not present

## 2013-03-23 NOTE — Assessment & Plan Note (Signed)
Pt is here requesting a refill on prednisone for his gout.  He brings an empty prescription bottle for prednisone and requests it refilled.  I explained to him that this medication is not given for preventing gout--he is taking allopurinol for that.  He seems not to understand and keeps stating it is for the pain.  Again, I explained to him the medicine was for an acute flare which he is not having.  I showed him his uric acid level over time which has decreased on allopurinol and is now at 6.5 in February 2014.  On exam there are no pertinent findings.  -continue allopurinol ; take 2 tablets 100mg  each by mouth daily (for a total dose of 200mg ) -did not refill prednisone; no acute flare

## 2013-03-23 NOTE — Patient Instructions (Signed)
Patient instructed to take medications as defined in the Anti-coagulation Track section of this encounter.  Patient instructed to take today's dose.  Patient verbalized understanding of these instructions.    

## 2013-03-23 NOTE — Patient Instructions (Addendum)
**  Please return to clinic on 04/07/13  Please continue to take all of your medications as prescribed

## 2013-03-23 NOTE — Assessment & Plan Note (Signed)
BP Readings from Last 3 Encounters:  03/23/13 187/102  03/10/13 173/105  02/12/13 133/78    Lab Results  Component Value Date   NA 142 09/23/2012   K 3.6 09/23/2012   CREATININE 2.08* 09/23/2012    Assessment: Blood pressure control: severely elevated Progress toward BP goal:  unable to assess Comments: there seems to be a lack of understanding of how to take medications; unsure if pt is actually compliant  Plan: Medications:  continue current medications for now Other plans: pt states he is taking all of his medications however, his HTN is very poorly controlled and pt does not want to discuss his BP this visit as he is just here for a refill of prednisone Please address HTN at next visit f/u with Dr. Redmond Pulling on 04/07/13 Pt will not be charged for this visit since he was here for prednisone refill and we were unable to provide this for him

## 2013-03-23 NOTE — Progress Notes (Signed)
Anti-Coagulation Progress Note  Patrick [name] is a 67 y.o. male who is currently on an anti-coagulation regimen.    RECENT RESULTS: Recent results are below, the most recent result is correlated with a dose of 25 mg. per week: Lab Results  Component Value Date   INR 2.20 03/23/2013   INR 2.90 03/02/2013   INR 2.80 02/02/2013    ANTI-COAG DOSE: Anticoagulation Dose Instructions as of 03/23/2013     [name] Tue Wed Thu Fri Sat   New Dose 5 mg 2.5 mg 2.5 mg 2.5 mg 5 mg 2.5 mg 5 mg       ANTICOAG SUMMARY: Anticoagulation Episode Summary   Current INR goal 2.0-3.0  Next INR check 04/20/2013  INR from last check 2.20 (03/23/2013)  Weekly max dose   Target end date Indefinite  INR check location   Preferred lab   Send INR reminders to ANTICOAG IMP   Indications  ATRIAL FIBRILLATION PAROXYSMAL CHRONIC [427.31] Long term current use of anticoagulant [V58.61]        Comments       Anticoagulation Care Providers   Provider Role Specialty Phone number   Bartholomew Crews, MD  Internal Medicine [phone]      ANTICOAG TODAY: Anticoagulation Summary as of 03/23/2013   INR goal 2.0-3.0  Selected INR 2.20 (03/23/2013)  Next INR check 04/20/2013  Target end date Indefinite   Indications  ATRIAL FIBRILLATION PAROXYSMAL CHRONIC [427.31] Long term current use of anticoagulant [V58.61]      Anticoagulation Episode Summary   INR check location    Preferred lab    Send INR reminders to ANTICOAG IMP   Comments     Anticoagulation Care Providers   Provider Role Specialty Phone number   Bartholomew Crews, MD  Internal Medicine [phone]      PATIENT INSTRUCTIONS: Patient Instructions  Patient instructed to take medications as defined in the Anti-coagulation Track section of this encounter.  Patient instructed to take today's dose.  Patient verbalized understanding of these instructions.       FOLLOW-UP Return in 4 weeks (on 04/20/2013) for Follow up INR  2PM.  [name], III Pharm.D., CACP

## 2013-03-23 NOTE — Progress Notes (Signed)
Patient ID: Patrick [name], male   DOB: [birth year], 67 y.o.   MRN: [id number]    Subjective:   Patient ID: Patrick [name] male   DOB: [birth year] 67 y.o.   MRN: [id number]  HPI: Patrick [name] is a 67 y.o. AAM here for an acute visit for a "gout medication refill."  He was added to my schedule this afternoon after he called requesting a refill for gout medication.  He has a PMH outlined below.  Please see problem based assessment and plan details below.     Past Medical History  Diagnosis Date  . Diabetes mellitus type II     Insulin dependent  . Hypertension   . Sleep apnea 07/2010  . CKD (chronic kidney disease), stage II   . Paroxysmal atrial fibrillation     on coumadin  . CAD (coronary artery disease)   . Depression   . Gout   . Hyperlipidemia   . 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 allopurinol (ZYLOPRIM) 100 MG tablet Take 2 tablets (200 mg total) by mouth daily.  60 tablet  6  . aspirin 81 MG EC tablet Take 81 mg by mouth daily.        . Blood Glucose Monitoring Suppl (ACCU-CHEK AVIVA PLUS) W/DEVICE KIT 1 each by Does not apply route as needed.  1 kit  0  . cloNIDine (CATAPRES) 0.3 MG tablet Take 1 tablet (0.3 mg total) by mouth 2 (two) times daily.  60 tablet  3  . diltiazem (CARDIZEM) 90 MG tablet Take 1 tablet (90 mg total) by mouth 2 (two) times daily.  180 tablet  2  . famotidine (PEPCID) 20 MG tablet Take 1 tablet (20 mg total) by mouth 2 (two) times daily as needed for heartburn.  62 tablet  5  . furosemide (LASIX) 80 MG tablet Take 1 tablet (80 mg total) by mouth 2 (two) times daily.  60 tablet  6  . glipiZIDE (GLUCOTROL XL) 10 MG 24 hr tablet Take 1 tablet (10 mg total) by mouth daily.  30 tablet  3  . glucose blood (TRUETRACK TEST) test strip Use to test blood sugar 2-3 times daily dx code 250.00  100 each  11  . insulin NPH (NOVOLIN N) 100 UNIT/ML  injection Inject 25 Units into the skin at bedtime.  10 mL  6  . Insulin Syringe-Needle U-100 (ELITE-THIN INS SYR .5CC/31G) 31G X 5/16" 0.5 ML MISC 1 each by Does not apply route 3 (three) times daily. Use to inject insulin daily  100 each  11  . Lancets 30G MISC Use to test blood glucose 2-3 times daily dx code 250.00  100 each  11  . Omega-3 Fatty Acids (FISH OIL) 1000 MG CAPS Take 1 capsule by mouth 2 (two) times daily.        . potassium chloride (MICRO-K) 10 MEQ CR capsule Take 1 capsule (10 mEq total) by mouth 2 (two) times daily.  60 capsule  3  . pravastatin (PRAVACHOL) 80 MG tablet Take 1 tablet (80 mg total) by mouth daily.  30 tablet  5  . warfarin (COUMADIN) 5 MG tablet Take 1/2 tablets Mon/Tues/Wed/Friday; Take 1 tablet Sun/Thurs/Sat.  30 tablet  2   No current facility-administered medications for this visit.   No family history on file. History   Social History  . Marital Status: Married  [name]    Number of Children: N/A  . Years of Education: 5   Occupational History  .     Social History Main Topics  . Smoking status: Former Smoker    Types: Cigarettes    Quit date: 10/26/1976  . Smokeless tobacco: Never Used  . Alcohol Use: No  . Drug Use: No  . Sexual Activity: None   Other Topics Concern  . None   Social History Narrative  . None   Review of Systems: Pertinent items are noted in HPI.  Objective:  Physical Exam: Filed Vitals:   03/23/13 1502  BP: 187/102  Pulse: 63  Temp: 97.6 F (36.4 C)  TempSrc: Oral  Height: 5\' 8"  (1.727 m)  Weight: 210 lb 14.4 oz (95.664 kg)  SpO2: 98%    Constitutional: Vital signs reviewed.  Patient is a well-developed and well-nourished male in no acute distress and cooperative with exam. Alert and oriented x3.  Head: Normocephalic and atraumatic Eyes: PERRL, EOMI, conjunctivae normal, No scleral icterus.  Cardiovascular: RRR, S1 normal, S2 normal, no MRG, pulses symmetric and intact  bilaterally Pulmonary/Chest: normal respiratory effort, CTAB, no wheezes, rales, or rhonchi Abdominal: Soft. Non-tender, non-distended, bowel sounds are normal Musculoskeletal: No joint deformities, erythema Neurological: A&O x3, cranial nerve II-XII are grossly intact Skin: Warm, dry and intact. No rash, cyanosis, or clubbing.   Assessment & Plan:

## 2013-03-24 NOTE — Addendum Note (Signed)
Addended by: Truddie Crumble on: 03/24/2013 02:38 PM   Modules accepted: Orders

## 2013-03-31 NOTE — Addendum Note (Signed)
Addended by: Truddie Crumble on: 03/31/2013 10:38 AM   Modules accepted: Orders

## 2013-04-07 ENCOUNTER — Ambulatory Visit (INDEPENDENT_AMBULATORY_CARE_PROVIDER_SITE_OTHER): Payer: Medicare Other | Admitting: Internal Medicine

## 2013-04-07 ENCOUNTER — Encounter: Payer: Self-pay | Admitting: Internal Medicine

## 2013-04-07 VITALS — BP 130/78 | HR 59 | Temp 97.2°F | Ht 68.0 in | Wt 211.1 lb

## 2013-04-07 DIAGNOSIS — N183 Chronic kidney disease, stage 3 unspecified: Secondary | ICD-10-CM | POA: Diagnosis not present

## 2013-04-07 DIAGNOSIS — E1165 Type 2 diabetes mellitus with hyperglycemia: Secondary | ICD-10-CM | POA: Diagnosis not present

## 2013-04-07 DIAGNOSIS — E119 Type 2 diabetes mellitus without complications: Secondary | ICD-10-CM

## 2013-04-07 DIAGNOSIS — I10 Essential (primary) hypertension: Secondary | ICD-10-CM

## 2013-04-07 DIAGNOSIS — E118 Type 2 diabetes mellitus with unspecified complications: Secondary | ICD-10-CM

## 2013-04-07 LAB — BASIC METABOLIC PANEL WITH GFR
Calcium: 9 mg/dL (ref 8.4–10.5)
GFR, Est African American: 32 mL/min — ABNORMAL LOW
GFR, Est Non African American: 28 mL/min — ABNORMAL LOW
Sodium: 140 mEq/L (ref 135–145)

## 2013-04-07 LAB — POCT GLYCOSYLATED HEMOGLOBIN (HGB A1C): Hemoglobin A1C: 8.9

## 2013-04-07 MED ORDER — INSULIN NPH (HUMAN) (ISOPHANE) 100 UNIT/ML ~~LOC~~ SUSP: 25.0000 [IU] | Freq: Every day | SUBCUTANEOUS | Status: DC

## 2013-04-07 NOTE — Assessment & Plan Note (Addendum)
Assessment:  HgbA1c not at goal but has improved.  8.9 today vs. 11.3 three months ago.  Patient reports compliance with 25 units insulin at bedtime and glipizide 10mg  daily.  There has been question of medication compliance in the past. Both patient and wife declined referral for a home health aide.  They have also declined referral to [facility].  The importance of medication compliance was reiterated to the patient.  Both he and his wife voice understanding.  The patient has seen Debera Lat for diabetes education in the past and he is willing to meet with her in the next few week.  Plan:   1) Have patient check blood sugar before every meal and at bedtime for the next month  2) After review of his blood sugar readings, consider switch from Novolin to long acting insulin at bedtime for getter glucose control  3) Return to clinic in 3-4 weeks to follow-up DM   4) Meet with Debera Lat after next clinic visit

## 2013-04-07 NOTE — Assessment & Plan Note (Signed)
BMP today to monitor renal function.

## 2013-04-07 NOTE — Progress Notes (Signed)
  Subjective:    Patient ID: Patrick [name], male    DOB: 1946-07-04, 66 y.o.   MRN: [id number]  HPI Comments: Patrick [name] is a 67 year old male with a PMH of HTN, DM type 2, Afib on coumadin, CKD3, HLD and history of stroke.  He presents for DM follow-up.  His Hgb A1c is 8.9 today.  This is an improvement from 11.3 three months ago.  He continues to take Glucotrol XL 10mg  daily and Novolin N 25 units at bedtime.  His lowest BG, as recorded on his home meter, has been 69.  He denies symptoms of hypoglycemia.  His most recent retinal scan completed in clinic on 08/25 revealed mild non-proliferative diabetic retinopathy.  His blood pressure has been elevated the past few office visits.  However, BP is 130/78 today.  He reports compliance with anti-hypertensives.  He denies CP/SOB, headache or loss of vision.    There has been question of Patrick [name]' medication compliance in the past.  Both he and his wife report compliance but his wife answers most of the questions during the interview.  She says in the past he has been non-compliant with medications when he feels well.  She says she tries to assist him with his medications but he only allows her to do so much.  They declined assistance from a home health aide.  They have also declined referral to [facility].      Review of Systems  Eyes: Negative for visual disturbance.  Respiratory: Negative for shortness of breath.   Cardiovascular: Negative for chest pain.  Gastrointestinal: Negative for diarrhea and constipation.  Endocrine: Positive for polydipsia and polyuria.  Genitourinary: Negative for dysuria.  Neurological: Negative for headaches.       Objective:   Physical Exam  Constitutional: He is oriented to person, place, and time. He appears well-developed and well-nourished. No distress.  HENT:  Mouth/Throat: Oropharynx is clear and moist.  Eyes: Pupils are equal, round, and reactive to light.  Cardiovascular: Normal rate, regular rhythm and  normal heart sounds.   Pulmonary/Chest: Effort normal and breath sounds normal. No respiratory distress. He has no wheezes. He has no rales.  Abdominal: Soft. He exhibits no distension. There is no tenderness.  Musculoskeletal:  2+ B/L pretibial edema  Neurological: He is alert and oriented to person, place, and time.  Skin: Skin is warm. He is not diaphoretic.  Psychiatric:  Flat affect          Assessment & Plan:  Please see problem based charting.

## 2013-04-07 NOTE — Assessment & Plan Note (Signed)
Assessment:  BP well controlled today, 130/778.  Patient reports compliance with medication.  Plan:   1) Continue current regimen  2) Follow-up for BP check in 1 month

## 2013-04-07 NOTE — Patient Instructions (Addendum)
General Instructions: 1. Please check your blood sugar before every meal and at bedtime (4 times daily) and return to clinic in 3-4 weeks for follow-up of diabetes and meeting with Diabetes Educator, Butch Penny Plyler.   2. Please take all medications as prescribed.    3. If you have worsening of your symptoms or new symptoms arise, please call the clinic [phone] or go to the ER immediately if symptoms are severe.   Treatment Goals:  Goals (1 Years of Data) as of 04/07/13         As of Today 03/23/13 03/10/13 03/10/13 02/12/13     Blood Pressure    . Blood Pressure < 140/90  130/78 187/102 173/105 186/107 133/78     Result Component    . HEMOGLOBIN A1C < 7.0  8.9        . LDL CALC < 100      68      Progress Toward Treatment Goals:  Treatment Goal 04/07/2013  Hemoglobin A1C improved  Blood pressure at goal    Self Care Goals & Plans:  Self Care Goal 04/07/2013  Manage my medications take my medicines as prescribed; bring my medications to every visit; refill my medications on time  Monitor my health keep track of my blood glucose; bring my glucose meter and log to each visit; keep track of my blood pressure  Eat healthy foods drink diet soda or water instead of juice or soda; eat more vegetables; eat foods that are low in salt; eat baked foods instead of fried foods; eat fruit for snacks and desserts  Be physically active find an activity I enjoy    Home Blood Glucose Monitoring 04/07/2013  Check my blood sugar 4 times a day  When to check my blood sugar before meals; at bedtime     Care Management & Community Referrals:  Referral 04/07/2013  Referrals made for care management support diabetes educator

## 2013-04-08 ENCOUNTER — Other Ambulatory Visit: Payer: Self-pay | Admitting: Internal Medicine

## 2013-04-08 ENCOUNTER — Telehealth: Payer: Self-pay | Admitting: Internal Medicine

## 2013-04-08 DIAGNOSIS — E785 Hyperlipidemia, unspecified: Secondary | ICD-10-CM

## 2013-04-08 MED ORDER — INSULIN NPH (HUMAN) (ISOPHANE) 100 UNIT/ML ~~LOC~~ SUSP: 25.0000 [IU] | Freq: Every day | SUBCUTANEOUS | Status: DC

## 2013-04-08 NOTE — Telephone Encounter (Signed)
Spoke with Mr. Posen' wife via telephone.  She reports that the insurance company will no longer cover Novolin N.  They will cover Humulin N.  Will e-prescribe Humulin N.

## 2013-04-09 ENCOUNTER — Other Ambulatory Visit: Payer: Self-pay | Admitting: *Deleted

## 2013-04-10 ENCOUNTER — Other Ambulatory Visit: Payer: Self-pay | Admitting: *Deleted

## 2013-04-10 MED ORDER — ALLOPURINOL 100 MG PO TABS: 200.0000 mg | ORAL_TABLET | Freq: Every day | ORAL | Status: DC

## 2013-04-10 MED ORDER — CLONIDINE HCL 0.3 MG PO TABS: 0.3000 mg | ORAL_TABLET | Freq: Two times a day (BID) | ORAL | Status: DC

## 2013-04-10 NOTE — Progress Notes (Signed)
I saw and evaluated the patient.  I personally confirmed the key portions of the history and exam documented by Dr. Wilson and I reviewed pertinent patient test results.  The assessment, diagnosis, and plan were formulated together and I agree with the documentation in the resident's note. 

## 2013-04-15 ENCOUNTER — Other Ambulatory Visit: Payer: Self-pay | Admitting: Internal Medicine

## 2013-04-15 DIAGNOSIS — E1165 Type 2 diabetes mellitus with hyperglycemia: Secondary | ICD-10-CM

## 2013-04-20 ENCOUNTER — Ambulatory Visit: Payer: Medicare Other

## 2013-04-27 ENCOUNTER — Ambulatory Visit (INDEPENDENT_AMBULATORY_CARE_PROVIDER_SITE_OTHER): Payer: Medicare Other | Admitting: Pharmacist

## 2013-04-27 DIAGNOSIS — I4891 Unspecified atrial fibrillation: Secondary | ICD-10-CM | POA: Diagnosis not present

## 2013-04-27 DIAGNOSIS — Z7901 Long term (current) use of anticoagulants: Secondary | ICD-10-CM

## 2013-04-27 LAB — POCT INR: INR: 2.2

## 2013-04-27 NOTE — Patient Instructions (Signed)
Patient instructed to take medications as defined in the Anti-coagulation Track section of this encounter.  Patient instructed to take today's dose.  Patient verbalized understanding of these instructions.    

## 2013-04-27 NOTE — Progress Notes (Signed)
Anti-Coagulation Progress Note  Patrick [name] is a 67 y.o. male who is currently on an anti-coagulation regimen.    RECENT RESULTS: Recent results are below, the most recent result is correlated with a dose of 25 mg. per week: Lab Results  Component Value Date   INR 2.20 04/27/2013   INR 2.20 03/23/2013   INR 2.90 03/02/2013    ANTI-COAG DOSE: Anticoagulation Dose Instructions as of 04/27/2013     [name] Tue Wed Thu Fri Sat   New Dose 5 mg 2.5 mg 2.5 mg 2.5 mg 5 mg 2.5 mg 5 mg       ANTICOAG SUMMARY: Anticoagulation Episode Summary   Current INR goal 2.0-3.0  Next INR check 05/25/2013  INR from last check 2.20 (04/27/2013)  Weekly max dose   Target end date Indefinite  INR check location   Preferred lab   Send INR reminders to ANTICOAG IMP   Indications  ATRIAL FIBRILLATION PAROXYSMAL CHRONIC [427.31] Long term current use of anticoagulant [V58.61]        Comments       Anticoagulation Care Providers   Provider Role Specialty Phone number   Bartholomew Crews, MD  Internal Medicine [phone]      ANTICOAG TODAY: Anticoagulation Summary as of 04/27/2013   INR goal 2.0-3.0  Selected INR 2.20 (04/27/2013)  Next INR check 05/25/2013  Target end date Indefinite   Indications  ATRIAL FIBRILLATION PAROXYSMAL CHRONIC [427.31] Long term current use of anticoagulant [V58.61]      Anticoagulation Episode Summary   INR check location    Preferred lab    Send INR reminders to ANTICOAG IMP   Comments     Anticoagulation Care Providers   Provider Role Specialty Phone number   Bartholomew Crews, MD  Internal Medicine [phone]      PATIENT INSTRUCTIONS: Patient Instructions  Patient instructed to take medications as defined in the Anti-coagulation Track section of this encounter.  Patient instructed to take today's dose.  Patient verbalized understanding of these instructions.       FOLLOW-UP Return in 4 weeks (on 05/25/2013) for Follow up INR at  1:45PM.  [name], III Pharm.D., CACP

## 2013-05-11 ENCOUNTER — Ambulatory Visit: Payer: Medicare Other | Admitting: Dietician

## 2013-05-11 ENCOUNTER — Encounter: Payer: Self-pay | Admitting: Internal Medicine

## 2013-05-11 ENCOUNTER — Ambulatory Visit (INDEPENDENT_AMBULATORY_CARE_PROVIDER_SITE_OTHER): Payer: Medicare Other | Admitting: Internal Medicine

## 2013-05-11 VITALS — BP 128/82 | HR 72 | Temp 97.1°F | Ht 68.0 in | Wt 211.0 lb

## 2013-05-11 DIAGNOSIS — I10 Essential (primary) hypertension: Secondary | ICD-10-CM | POA: Diagnosis not present

## 2013-05-11 DIAGNOSIS — E118 Type 2 diabetes mellitus with unspecified complications: Secondary | ICD-10-CM

## 2013-05-11 DIAGNOSIS — E1165 Type 2 diabetes mellitus with hyperglycemia: Secondary | ICD-10-CM | POA: Diagnosis not present

## 2013-05-11 LAB — GLUCOSE, CAPILLARY: Glucose-Capillary: 206 mg/dL — ABNORMAL HIGH (ref 70–99)

## 2013-05-11 MED ORDER — INSULIN NPH (HUMAN) (ISOPHANE) 100 UNIT/ML ~~LOC~~ SUSP: SUBCUTANEOUS | Status: DC

## 2013-05-11 NOTE — Assessment & Plan Note (Signed)
BP Readings from Last 3 Encounters:  05/11/13 128/82  04/07/13 130/78  03/23/13 187/102    Lab Results  Component Value Date   NA 140 04/07/2013   K 3.7 04/07/2013   CREATININE 2.38* 04/07/2013    Assessment: Blood pressure control: controlled Progress toward BP goal:  at goal Comments: BP well controlled  Plan: Medications:  continue current medications Educational resources provided: brochure Self management tools provided: home blood pressure logbook Other plans: Recheck at next visit

## 2013-05-11 NOTE — Progress Notes (Signed)
Case discussed with Dr. Brown at the time of the visit.  We reviewed the resident's history and exam and pertinent patient test results.  I agree with the assessment, diagnosis and plan of care documented in the resident's note. 

## 2013-05-11 NOTE — Patient Instructions (Signed)
General Instructions: For your diabetes, your blood sugars are still elevated.  To treat this, we are changing your insulin. -for your NPH insulin, start taking 20 units every morning, and 10 units every evening -continue to take Glipizide  Please return for a follow-up visit in 1-2 months for recheck.   Treatment Goals:  Goals (1 Years of Data) as of 05/11/13         As of Today As of Today 04/07/13 03/23/13 03/10/13     Blood Pressure    . Blood Pressure < 140/90  128/82 148/91 130/78 187/102 173/105     Result Component    . HEMOGLOBIN A1C < 7.0    8.9      . LDL CALC < 100            Progress Toward Treatment Goals:  Treatment Goal 05/11/2013  Hemoglobin A1C unchanged  Blood pressure at goal    Self Care Goals & Plans:  Self Care Goal 05/11/2013  Manage my medications take my medicines as prescribed; bring my medications to every visit; refill my medications on time  Monitor my health -  Eat healthy foods drink diet soda or water instead of juice or soda; eat more vegetables; eat foods that are low in salt; eat baked foods instead of fried foods  Be physically active -    Home Blood Glucose Monitoring 05/11/2013  Check my blood sugar once a day  When to check my blood sugar before meals     Care Management & Community Referrals:  Referral 05/11/2013  Referrals made for care management support none needed

## 2013-05-11 NOTE — Progress Notes (Signed)
HPI The patient is a 67 y.o. male with a history of HTN, DM, CAD, afib, presenting for a follow-up visit.  BP mildly elevated on initial check today.  However, repeat showed better BP control.  The patient has a history of DM.  He is taking glipizide, as well as NPH insulin 25 units qhs.  He notes occasional episodes of hypoglycemia, with feelings of "shakiness", about every 2-3 months.  Glucometer review reveals inconsistent glucose monitoring, but consistent hyperglycemia throughout the day.  The patient notes no blurry vision, polyuria, or polydipsia.  ROS: General: no fevers, chills, changes in weight, changes in appetite Skin: no rash HEENT: no blurry vision, hearing changes, sore throat Pulm: no dyspnea, coughing, wheezing CV: no chest pain, palpitations, shortness of breath Abd: no abdominal pain, nausea/vomiting, diarrhea/constipation GU: no dysuria, hematuria, polyuria Ext: no arthralgias, myalgias Neuro: no weakness, numbness, or tingling  Filed Vitals:   05/11/13 1430  BP: 128/82  Pulse: 72  Temp:     PEX General: alert, cooperative, and in no apparent distress.  Appears uninterested. HEENT: pupils equal round and reactive to light, vision grossly intact, oropharynx clear and non-erythematous  Neck: supple Lungs: clear to ascultation bilaterally, normal work of respiration, no wheezes, rales, ronchi Heart: regular rate and rhythm, no murmurs, gallops, or rubs Abdomen: soft, non-tender, non-distended, normal bowel sounds Extremities: no cyanosis, clubbing, or edema Neurologic: alert & oriented X3, cranial nerves II-XII intact, strength grossly intact, sensation intact to light touch  Current Outpatient Prescriptions on File Prior to Visit  Medication Sig Dispense Refill  . allopurinol (ZYLOPRIM) 100 MG tablet Take 2 tablets (200 mg total) by mouth daily.  60 tablet  2  . aspirin 81 MG EC tablet Take 81 mg by mouth daily.        . Blood Glucose Monitoring Suppl  (ACCU-CHEK AVIVA PLUS) W/DEVICE KIT 1 each by Does not apply route as needed.  1 kit  0  . cloNIDine (CATAPRES) 0.3 MG tablet Take 1 tablet (0.3 mg total) by mouth 2 (two) times daily.  180 tablet  1  . diltiazem (CARDIZEM) 90 MG tablet Take 1 tablet (90 mg total) by mouth 2 (two) times daily.  180 tablet  2  . famotidine (PEPCID) 20 MG tablet Take 1 tablet (20 mg total) by mouth 2 (two) times daily as needed for heartburn.  62 tablet  5  . furosemide (LASIX) 80 MG tablet Take 1 tablet (80 mg total) by mouth 2 (two) times daily.  60 tablet  6  . glipiZIDE (GLUCOTROL XL) 10 MG 24 hr tablet Take 1 tablet (10 mg total) by mouth daily.  30 tablet  3  . glucose blood (TRUETRACK TEST) test strip Use to test blood sugar 2-3 times daily dx code 250.00  100 each  11  . insulin NPH (HUMULIN N) 100 UNIT/ML injection Inject 25 Units into the skin at bedtime.  10 mL  3  . Insulin Syringe-Needle U-100 (ELITE-THIN INS SYR .5CC/31G) 31G X 5/16" 0.5 ML MISC 1 each by Does not apply route 3 (three) times daily. Use to inject insulin daily  100 each  11  . Lancets 30G MISC Use to test blood glucose 2-3 times daily dx code 250.00  100 each  11  . Omega-3 Fatty Acids (FISH OIL) 1000 MG CAPS Take 1 capsule by mouth 2 (two) times daily.        . potassium chloride (MICRO-K) 10 MEQ CR capsule Take 1 capsule (10 mEq  total) by mouth 2 (two) times daily.  60 capsule  3  . pravastatin (PRAVACHOL) 80 MG tablet Take 1 tablet (80 mg total) by mouth daily.  30 tablet  5  . warfarin (COUMADIN) 5 MG tablet Take 1/2 tablets Mon/Tues/Wed/Friday; Take 1 tablet Sun/Thurs/Sat.  30 tablet  2   No current facility-administered medications on file prior to visit.    Assessment/Plan

## 2013-05-11 NOTE — Assessment & Plan Note (Signed)
Lab Results  Component Value Date   HGBA1C 8.9 04/07/2013   HGBA1C 11.3 01/15/2013   HGBA1C 10.2 09/23/2012     Assessment: Diabetes control: fair control Progress toward A1C goal:  unchanged Comments: The patient's glucometer still shows consistently elevated blood sugars.  We discussed the role of insulin, and the benefits of morning use of NPH versus evening use. After a long discussion about the complications of diabetes, the patient agrees to try using NPH twice per day, rather than once per day. Prior office notes have noted concern about noncompliance, but given the patient's stated desire for glucose control, we will try BID NPH  Plan: Medications:  Change in pH from 25 units qhs to 20 U qam and 10 units qhs Home glucose monitoring: Frequency: once a day Timing: before meals Instruction/counseling given: reminded to bring blood glucose meter & log to each visit Educational resources provided: brochure Self management tools provided: copy of home glucose meter download Other plans: Recheck at next visit

## 2013-05-12 NOTE — Progress Notes (Signed)
Patient left prior to being seen

## 2013-05-25 ENCOUNTER — Ambulatory Visit (INDEPENDENT_AMBULATORY_CARE_PROVIDER_SITE_OTHER): Payer: Medicare Other | Admitting: Pharmacist

## 2013-05-25 DIAGNOSIS — I4891 Unspecified atrial fibrillation: Secondary | ICD-10-CM

## 2013-05-25 DIAGNOSIS — Z7901 Long term (current) use of anticoagulants: Secondary | ICD-10-CM | POA: Diagnosis not present

## 2013-05-25 NOTE — Patient Instructions (Signed)
Patient instructed to take medications as defined in the Anti-coagulation Track section of this encounter.  Patient instructed to take today's dose.  Patient verbalized understanding of these instructions.    

## 2013-05-25 NOTE — Progress Notes (Signed)
Indication: Atrial fibrillation.  Duration: Lifelong.  INR: At target.  Agree with Dr. Gladstone Pih assessment and plan.

## 2013-05-25 NOTE — Progress Notes (Signed)
Anti-Coagulation Progress Note  Patrick [name] is a 67 y.o. male who is currently on an anti-coagulation regimen.    RECENT RESULTS: Recent results are below, the most recent result is correlated with a dose of 27.5 mg. per week: Lab Results  Component Value Date   INR 2.10 05/25/2013   INR 2.20 04/27/2013   INR 2.20 03/23/2013    ANTI-COAG DOSE: Anticoagulation Dose Instructions as of 05/25/2013     [name] Tue Wed Thu Fri Sat   New Dose 5 mg 5 mg 5 mg 2.5 mg 5 mg 5 mg 5 mg       ANTICOAG SUMMARY: Anticoagulation Episode Summary   Current INR goal 2.0-3.0  Next INR check 06/22/2013  INR from last check 2.10 (05/25/2013)  Weekly max dose   Target end date Indefinite  INR check location   Preferred lab   Send INR reminders to ANTICOAG IMP   Indications  ATRIAL FIBRILLATION PAROXYSMAL CHRONIC [427.31] Long term current use of anticoagulant [V58.61]        Comments       Anticoagulation Care Providers   Provider Role Specialty Phone number   Bartholomew Crews, MD  Internal Medicine [phone]      ANTICOAG TODAY: Anticoagulation Summary as of 05/25/2013   INR goal 2.0-3.0  Selected INR 2.10 (05/25/2013)  Next INR check 06/22/2013  Target end date Indefinite   Indications  ATRIAL FIBRILLATION PAROXYSMAL CHRONIC [427.31] Long term current use of anticoagulant [V58.61]      Anticoagulation Episode Summary   INR check location    Preferred lab    Send INR reminders to ANTICOAG IMP   Comments     Anticoagulation Care Providers   Provider Role Specialty Phone number   Bartholomew Crews, MD  Internal Medicine [phone]      PATIENT INSTRUCTIONS: Patient Instructions  Patient instructed to take medications as defined in the Anti-coagulation Track section of this encounter.  Patient instructed to take today's dose.  Patient verbalized understanding of these instructions.       FOLLOW-UP Return in 4 weeks (on 06/22/2013) for Follow up INR at  2:15PM.  [name], III Pharm.D., CACP

## 2013-06-22 ENCOUNTER — Ambulatory Visit (INDEPENDENT_AMBULATORY_CARE_PROVIDER_SITE_OTHER): Payer: Medicare Other | Admitting: Pharmacist

## 2013-06-22 DIAGNOSIS — I6350 Cerebral infarction due to unspecified occlusion or stenosis of unspecified cerebral artery: Secondary | ICD-10-CM | POA: Diagnosis not present

## 2013-06-22 DIAGNOSIS — E119 Type 2 diabetes mellitus without complications: Secondary | ICD-10-CM | POA: Diagnosis not present

## 2013-06-22 DIAGNOSIS — N183 Chronic kidney disease, stage 3 unspecified: Secondary | ICD-10-CM | POA: Diagnosis not present

## 2013-06-22 DIAGNOSIS — Z7901 Long term (current) use of anticoagulants: Secondary | ICD-10-CM | POA: Diagnosis not present

## 2013-06-22 DIAGNOSIS — E1165 Type 2 diabetes mellitus with hyperglycemia: Secondary | ICD-10-CM | POA: Diagnosis not present

## 2013-06-22 DIAGNOSIS — I4891 Unspecified atrial fibrillation: Secondary | ICD-10-CM

## 2013-06-22 DIAGNOSIS — I10 Essential (primary) hypertension: Secondary | ICD-10-CM | POA: Diagnosis not present

## 2013-06-22 LAB — POCT INR: INR: 2.7

## 2013-06-22 MED ORDER — WARFARIN SODIUM 5 MG PO TABS: ORAL_TABLET | ORAL | Status: DC

## 2013-06-22 NOTE — Patient Instructions (Signed)
Patient instructed to take medications as defined in the Anti-coagulation Track section of this encounter.  Patient instructed to take today's dose.  Patient verbalized understanding of these instructions.    

## 2013-06-22 NOTE — Progress Notes (Signed)
Anti-Coagulation Progress Note  Patrick [name] is a 67 y.o. male who is currently on an anti-coagulation regimen.    RECENT RESULTS: Recent results are below, the most recent result is correlated with a dose of 32.5 mg. per week: Lab Results  Component Value Date   INR 2.70 06/22/2013   INR 2.10 05/25/2013   INR 2.20 04/27/2013    ANTI-COAG DOSE: Anticoagulation Dose Instructions as of 06/22/2013     [name] Tue Wed Thu Fri Sat   New Dose 5 mg 2.5 mg 5 mg 2.5 mg 5 mg 2.5 mg 5 mg       ANTICOAG SUMMARY: Anticoagulation Episode Summary   Current INR goal 2.0-3.0  Next INR check 07/13/2013  INR from last check 2.70 (06/22/2013)  Weekly max dose   Target end date Indefinite  INR check location   Preferred lab   Send INR reminders to ANTICOAG IMP   Indications  ATRIAL FIBRILLATION PAROXYSMAL CHRONIC [427.31] Long term current use of anticoagulant [V58.61]        Comments       Anticoagulation Care Providers   Provider Role Specialty Phone number   Bartholomew Crews, MD  Internal Medicine [phone]      ANTICOAG TODAY: Anticoagulation Summary as of 06/22/2013   INR goal 2.0-3.0  Selected INR 2.70 (06/22/2013)  Next INR check 07/13/2013  Target end date Indefinite   Indications  ATRIAL FIBRILLATION PAROXYSMAL CHRONIC [427.31] Long term current use of anticoagulant [V58.61]      Anticoagulation Episode Summary   INR check location    Preferred lab    Send INR reminders to ANTICOAG IMP   Comments     Anticoagulation Care Providers   Provider Role Specialty Phone number   Bartholomew Crews, MD  Internal Medicine [phone]      PATIENT INSTRUCTIONS: Patient Instructions  Patient instructed to take medications as defined in the Anti-coagulation Track section of this encounter.  Patient instructed to take today's dose.  Patient verbalized understanding of these instructions.       FOLLOW-UP Return in 3 weeks (on 07/13/2013) for Follow up INR  at 2:30PM.  [name], III Pharm.D., CACP

## 2013-06-24 ENCOUNTER — Emergency Department (HOSPITAL_COMMUNITY)
Admission: EM | Admit: 2013-06-24 | Discharge: 2013-06-25 | Disposition: A | Discharge status: Home / Self Care | Payer: Medicare Other | Attending: Emergency Medicine | Admitting: Emergency Medicine

## 2013-06-24 ENCOUNTER — Emergency Department (HOSPITAL_COMMUNITY): Payer: Medicare Other

## 2013-06-24 ENCOUNTER — Encounter (HOSPITAL_COMMUNITY): Payer: Self-pay | Admitting: Emergency Medicine

## 2013-06-24 ENCOUNTER — Other Ambulatory Visit: Payer: Self-pay

## 2013-06-24 DIAGNOSIS — N182 Chronic kidney disease, stage 2 (mild): Secondary | ICD-10-CM | POA: Diagnosis not present

## 2013-06-24 DIAGNOSIS — F329 Major depressive disorder, single episode, unspecified: Secondary | ICD-10-CM | POA: Insufficient documentation

## 2013-06-24 DIAGNOSIS — R1110 Vomiting, unspecified: Secondary | ICD-10-CM | POA: Insufficient documentation

## 2013-06-24 DIAGNOSIS — M109 Gout, unspecified: Secondary | ICD-10-CM | POA: Diagnosis not present

## 2013-06-24 DIAGNOSIS — R079 Chest pain, unspecified: Secondary | ICD-10-CM | POA: Diagnosis not present

## 2013-06-24 DIAGNOSIS — Z7982 Long term (current) use of aspirin: Secondary | ICD-10-CM | POA: Insufficient documentation

## 2013-06-24 DIAGNOSIS — Z79899 Other long term (current) drug therapy: Secondary | ICD-10-CM | POA: Insufficient documentation

## 2013-06-24 DIAGNOSIS — Z794 Long term (current) use of insulin: Secondary | ICD-10-CM | POA: Insufficient documentation

## 2013-06-24 DIAGNOSIS — I129 Hypertensive chronic kidney disease with stage 1 through stage 4 chronic kidney disease, or unspecified chronic kidney disease: Secondary | ICD-10-CM | POA: Diagnosis not present

## 2013-06-24 DIAGNOSIS — R1013 Epigastric pain: Secondary | ICD-10-CM | POA: Diagnosis not present

## 2013-06-24 DIAGNOSIS — R0609 Other forms of dyspnea: Secondary | ICD-10-CM | POA: Diagnosis not present

## 2013-06-24 DIAGNOSIS — E785 Hyperlipidemia, unspecified: Secondary | ICD-10-CM | POA: Diagnosis not present

## 2013-06-24 DIAGNOSIS — F3289 Other specified depressive episodes: Secondary | ICD-10-CM | POA: Insufficient documentation

## 2013-06-24 DIAGNOSIS — Z7901 Long term (current) use of anticoagulants: Secondary | ICD-10-CM | POA: Insufficient documentation

## 2013-06-24 DIAGNOSIS — E119 Type 2 diabetes mellitus without complications: Secondary | ICD-10-CM | POA: Diagnosis not present

## 2013-06-24 DIAGNOSIS — I4891 Unspecified atrial fibrillation: Secondary | ICD-10-CM | POA: Diagnosis not present

## 2013-06-24 DIAGNOSIS — R0602 Shortness of breath: Secondary | ICD-10-CM | POA: Insufficient documentation

## 2013-06-24 DIAGNOSIS — Z87891 Personal history of nicotine dependence: Secondary | ICD-10-CM | POA: Insufficient documentation

## 2013-06-24 DIAGNOSIS — I2510 Atherosclerotic heart disease of native coronary artery without angina pectoris: Secondary | ICD-10-CM | POA: Diagnosis not present

## 2013-06-24 DIAGNOSIS — Z8673 Personal history of transient ischemic attack (TIA), and cerebral infarction without residual deficits: Secondary | ICD-10-CM | POA: Insufficient documentation

## 2013-06-24 HISTORY — DX: Unspecified atrial fibrillation: I48.91

## 2013-06-24 LAB — PROTIME-INR: INR: 3.11 — ABNORMAL HIGH (ref 0.00–1.49)

## 2013-06-24 LAB — POCT I-STAT TROPONIN I

## 2013-06-24 LAB — CBC
Hemoglobin: 13.7 g/dL (ref 13.0–17.0)
MCH: 27 pg (ref 26.0–34.0)
MCHC: 34.5 g/dL (ref 30.0–36.0)
MCV: 78.3 fL (ref 78.0–100.0)
RBC: 5.07 MIL/uL (ref 4.22–5.81)
WBC: 6.1 10*3/uL (ref 4.0–10.5)

## 2013-06-24 LAB — GLUCOSE, CAPILLARY: Glucose-Capillary: 267 mg/dL — ABNORMAL HIGH (ref 70–99)

## 2013-06-24 LAB — OCCULT BLOOD, POC DEVICE: Fecal Occult Bld: NEGATIVE

## 2013-06-24 MED ORDER — SODIUM CHLORIDE 0.9 % IV BOLUS (SEPSIS): 500.0000 mL | INTRAVENOUS | Status: DC

## 2013-06-24 NOTE — ED Notes (Signed)
Pt st's he has vomited x's 2 dark red blood.  Pt is currently taking coumadin.   Pt denies any chest pain or discomfort at this time.

## 2013-06-24 NOTE — ED Notes (Signed)
Pt returned from x-ray.  Pt taking his home medications after being approved by MD.   Diltiazem HCL 90mg   Catapres 0.3mg  Furosemide 80mg .

## 2013-06-24 NOTE — ED Notes (Signed)
Pt stated that he didn't want blood to be drawn unless it was from his IV so he won't be stuck twice. Nurse made aware

## 2013-06-24 NOTE — ED Provider Notes (Signed)
CSN: [id number]     Arrival date & time 06/24/13  1946 History   First MD Initiated Contact with Patient 06/24/13 2010     Chief Complaint  Patient presents with  . Chest Pain   (Consider location/radiation/quality/duration/timing/severity/associated sxs/prior Treatment) Patient is a 67 y.o. male presenting with abdominal pain. The history is provided by the patient.  Abdominal Pain Pain location:  Epigastric Pain quality: aching   Pain radiates to:  Does not radiate Pain severity:  Mild Onset quality:  Gradual Duration:  1 day Timing:  Intermittent Progression:  Resolved Chronicity:  New Context comment:  At rest Relieved by:  Nothing Worsened by:  Nothing tried Ineffective treatments:  None tried Associated symptoms: shortness of breath (mild)   Associated symptoms: no chest pain, no cough, no diarrhea, no dysuria, no fever, no hematuria, no nausea and no vomiting     Past Medical History  Diagnosis Date  . Diabetes mellitus type II     Insulin dependent  . Hypertension   . Sleep apnea 07/2010  . CKD (chronic kidney disease), stage II   . Paroxysmal atrial fibrillation     on coumadin  . CAD (coronary artery disease)   . Depression   . Gout   . Hyperlipidemia   . 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 Atrial fibrillation    History reviewed. No pertinent past surgical history. History reviewed. No pertinent family history. History  Substance Use Topics  . Smoking status: Former Smoker    Types: Cigarettes    Quit date: 10/26/1976  . Smokeless tobacco: Never Used  . Alcohol Use: No    Review of Systems  Constitutional: Negative for fever.  HENT: Negative for drooling and rhinorrhea.   Eyes: Negative for pain.  Respiratory: Positive for shortness of breath (mild). Negative for cough.   Cardiovascular: Negative for chest pain and leg swelling.  Gastrointestinal: Positive for abdominal pain. Negative for  nausea, vomiting and diarrhea.  Genitourinary: Negative for dysuria and hematuria.  Musculoskeletal: Negative for gait problem and neck pain.  Skin: Negative for color change.  Neurological: Negative for numbness and headaches.  Hematological: Negative for adenopathy.  Psychiatric/Behavioral: Negative for behavioral problems.  All other systems reviewed and are negative.    Allergies  Review of patient's allergies indicates no known allergies.  Home Medications   Current Outpatient Rx  Name  Route  Sig  Dispense  Refill  . allopurinol (ZYLOPRIM) 100 MG tablet   Oral   Take 2 tablets (200 mg total) by mouth daily.   60 tablet   2   . aspirin 81 MG EC tablet   Oral   Take 81 mg by mouth daily.           . cloNIDine (CATAPRES) 0.3 MG tablet   Oral   Take 1 tablet (0.3 mg total) by mouth 2 (two) times daily.   180 tablet   1   . diltiazem (CARDIZEM) 90 MG tablet   Oral   Take 1 tablet (90 mg total) by mouth 2 (two) times daily.   180 tablet   2   . famotidine (PEPCID) 20 MG tablet   Oral   Take 1 tablet (20 mg total) by mouth 2 (two) times daily as needed for heartburn.   62 tablet   5   . glipiZIDE (GLUCOTROL XL) 10 MG 24 hr tablet   Oral   Take 1 tablet (10  mg total) by mouth daily.   30 tablet   3   . insulin NPH (HUMULIN N) 100 UNIT/ML injection      Inject subcutaneous 20 units every morning, and 10 units every evening   10 mL   11   . Omega-3 Fatty Acids (FISH OIL) 1000 MG CAPS   Oral   Take 1 capsule by mouth 2 (two) times daily.           . potassium chloride (MICRO-K) 10 MEQ CR capsule   Oral   Take 1 capsule (10 mEq total) by mouth 2 (two) times daily.   60 capsule   3   . pravastatin (PRAVACHOL) 80 MG tablet   Oral   Take 1 tablet (80 mg total) by mouth daily.   30 tablet   5   . warfarin (COUMADIN) 5 MG tablet      Take 1/2 tablets Mon/Wed/Friday; Take 1 tablet Sun/Tues/Thurs/Sat   30 tablet   2   . Blood Glucose Monitoring  Suppl (ACCU-CHEK AVIVA PLUS) W/DEVICE KIT   Does not apply   1 each by Does not apply route as needed.   1 kit   0   . EXPIRED: furosemide (LASIX) 80 MG tablet   Oral   Take 1 tablet (80 mg total) by mouth 2 (two) times daily.   60 tablet   6   . glucose blood (TRUETRACK TEST) test strip      Use to test blood sugar 2-3 times daily dx code 250.00   100 each   11   . Insulin Syringe-Needle U-100 (ELITE-THIN INS SYR .5CC/31G) 31G X 5/16" 0.5 ML MISC   Does not apply   1 each by Does not apply route 3 (three) times daily. Use to inject insulin daily   100 each   11   . Lancets 30G MISC      Use to test blood glucose 2-3 times daily dx code 250.00   100 each   11    BP 188/120  Pulse 32  Temp(Src) 97.5 F (36.4 C) (Oral)  Resp 16  Wt 210 lb (95.255 kg)  SpO2 100% Physical Exam  Nursing note and vitals reviewed. Constitutional: He is oriented to person, place, and time. He appears well-developed and well-nourished.  HENT:  Head: Normocephalic and atraumatic.  Right Ear: External ear normal.  Left Ear: External ear normal.  Nose: Nose normal.  Mouth/Throat: Oropharynx is clear and moist. No oropharyngeal exudate.  Eyes: Conjunctivae and EOM are normal. Pupils are equal, round, and reactive to light.  Neck: Normal range of motion. Neck supple.  Cardiovascular: Regular rhythm, normal heart sounds and intact distal pulses.  Exam reveals no gallop and no friction rub.   No murmur heard. Afib, HR 92  Pulmonary/Chest: Effort normal and breath sounds normal. No respiratory distress. He has no wheezes.  Abdominal: Soft. Bowel sounds are normal. He exhibits no distension. There is no tenderness. There is no rebound and no guarding.  Genitourinary: Rectum normal.  Brown stool. No blood.   Musculoskeletal: Normal range of motion. He exhibits no edema and no tenderness.  Neurological: He is alert and oriented to person, place, and time.  Skin: Skin is warm and dry.   Psychiatric: He has a normal mood and affect. His behavior is normal.    ED Course  Procedures (including critical care time) Labs Review Labs Reviewed  GLUCOSE, CAPILLARY - Abnormal; Notable for the following:    Glucose-Capillary 267 (*)  All other components within normal limits  COMPREHENSIVE METABOLIC PANEL - Abnormal; Notable for the following:    Potassium 3.0 (*)    Glucose, Bld 252 (*)    BUN 34 (*)    Creatinine, Ser 2.16 (*)    Alkaline Phosphatase 123 (*)    GFR calc non Af Amer 30 (*)    GFR calc Af Amer 35 (*)    All other components within normal limits  LIPASE, BLOOD - Abnormal; Notable for the following:    Lipase 73 (*)    All other components within normal limits  PROTIME-INR - Abnormal; Notable for the following:    Prothrombin Time 30.9 (*)    INR 3.11 (*)    All other components within normal limits  CBC  TROPONIN I  POCT I-STAT TROPONIN I  OCCULT BLOOD, POC DEVICE   Imaging Review Dg Chest 2 View  06/24/2013   CLINICAL DATA:  Hematemesis.  EXAM: CHEST  2 VIEW  COMPARISON:  11/22/2010  FINDINGS: Mild cardiomegaly is stable. Both lungs are clear. The visualized skeletal structures are unremarkable.  IMPRESSION: Mild cardiomegaly.  No active lung disease.   Electronically Signed   By: Earle Gell M.D.   On: 06/24/2013 22:30    Date: 06/24/2013  Rate: 141  Rhythm: atrial fibrillation  QRS Axis: left  Intervals: QT prolonged  ST/T Wave abnormalities: normal  Conduction Disutrbances:none  Narrative Interpretation: Afib w/ RVR, minimal voltage criteria for LVH  Old EKG Reviewed: changes noted   Date: 06/25/2013  Rate: 87  Rhythm: atrial fibrillation  QRS Axis: left  Intervals: PR is indeterminate, QTc mildly prolonged  ST/T Wave abnormalities: normal  Conduction Disutrbances:none  Narrative Interpretation: LVH w/ repol abnormality  Old EKG Reviewed: unchanged    MDM   1. Vomiting   2. Epigastric pain    9:42 PM 67 y.o. male w hx of  Afib on coumadin who presents with 2 episodes of of possible hematemesis today w/ dark red emesis. He also notes mild epigastric pain which he has had intermittently since yesterday. He denies dark or bloody stools. He denies any pain on exam currently. He is afebrile and vital signs are unremarkable here. Initial ecg shows afib w/ RVR (HR 141), but pt's HR on my exam is in the 80's-90's. Will get screening labwork.  12:54 AM: Hemeoccult neg. Hgb stable. VS stable. Pt has known renal insuff which remains stable.  Mildly elev lipase which could be related to his 2 episodes of emesis. Delta trop neg. He remains asx here, denies epig or cp. Has had no emesis.  I have discussed the diagnosis/risks/treatment options with the patient and family and believe the pt to be eligible for discharge home to follow-up with pcp next monday. We also discussed returning to the ED immediately if new or worsening sx occur. We discussed the sx which are most concerning (e.g., further vomiting, dark/bloody stools or emesis, sob, cp, return of epig pain) that necessitate immediate return. Any new prescriptions provided to the patient are listed below.     Blanchard Kelch, MD 06/25/13 [phone]

## 2013-06-24 NOTE — ED Notes (Signed)
Pt in c/o chest pain since yesterday, also n/v, shortness of breath, states pain is intermittent and points to central chest area when describing pain, pt alert and oriented, pt denies pain at this time.

## 2013-06-24 NOTE — ED Notes (Signed)
Pt continues to deny any pain or discomfort at this time.

## 2013-06-24 NOTE — ED Notes (Signed)
The pt is having no pain no vomiting family at the bedside.

## 2013-06-25 DIAGNOSIS — R1013 Epigastric pain: Secondary | ICD-10-CM | POA: Diagnosis present

## 2013-06-25 DIAGNOSIS — R1110 Vomiting, unspecified: Secondary | ICD-10-CM | POA: Diagnosis present

## 2013-06-25 LAB — COMPREHENSIVE METABOLIC PANEL
ALT: 15 U/L (ref 0–53)
AST: 16 U/L (ref 0–37)
Albumin: 3.8 g/dL (ref 3.5–5.2)
Alkaline Phosphatase: 123 U/L — ABNORMAL HIGH (ref 39–117)
CO2: 25 mEq/L (ref 19–32)
Calcium: 9.2 mg/dL (ref 8.4–10.5)
Chloride: 101 mEq/L (ref 96–112)
Creatinine, Ser: 2.16 mg/dL — ABNORMAL HIGH (ref 0.50–1.35)
GFR calc non Af Amer: 30 mL/min — ABNORMAL LOW (ref >90)
Potassium: 3 mEq/L — ABNORMAL LOW (ref 3.5–5.1)
Sodium: 140 mEq/L (ref 135–145)
Total Bilirubin: 0.5 mg/dL (ref 0.3–1.2)
Total Protein: 6.8 g/dL (ref 6.0–8.3)

## 2013-06-25 LAB — TROPONIN I: Troponin I: <0.3 ng/mL (ref <0.30)

## 2013-06-29 ENCOUNTER — Telehealth: Payer: Self-pay | Admitting: Pharmacist

## 2013-06-29 NOTE — Telephone Encounter (Signed)
Patient should be seen in clinic this week for follow up of ED visit; will route to triage nurse so this can be scheduled.

## 2013-06-29 NOTE — Telephone Encounter (Signed)
Given message from Triage Nurse to call patient. Attempted call to patient, he was sleeping states his wife. Wife states (and review of EHR/Emlyn Link corroborates same) patient was seen in ED with hematemesis, discharged to home with instructions to not take warfarin until advised by his PCP. His After Visit Summary indicates he was scheduled an appointment for 16-DEC-14. I will discuss with attending physician to determine if patient can be seen sooner to determine the origin/cause of his hematemesis. Answer to question of resumption of warfarin will best be answered by knowing where and why he is bleeding.

## 2013-06-30 ENCOUNTER — Ambulatory Visit (INDEPENDENT_AMBULATORY_CARE_PROVIDER_SITE_OTHER): Payer: Medicare Other | Admitting: Internal Medicine

## 2013-06-30 ENCOUNTER — Inpatient Hospital Stay (HOSPITAL_COMMUNITY)
Admission: AD | Admit: 2013-06-30 | Discharge: 2013-07-02 | DRG: 312 | Disposition: A | Discharge status: Home / Self Care | Payer: Medicare Other | Source: Ambulatory Visit | Attending: Internal Medicine | Admitting: Internal Medicine

## 2013-06-30 ENCOUNTER — Encounter: Payer: Self-pay | Admitting: Internal Medicine

## 2013-06-30 ENCOUNTER — Ambulatory Visit (HOSPITAL_COMMUNITY)
Admission: RE | Admit: 2013-06-30 | Discharge: 2013-06-30 | Disposition: A | Discharge status: Home / Self Care | Payer: Medicare Other | Source: Ambulatory Visit | Attending: Family Medicine | Admitting: Family Medicine

## 2013-06-30 VITALS — BP 157/98 | HR 78 | Temp 96.8°F | Ht 68.0 in | Wt 197.7 lb

## 2013-06-30 DIAGNOSIS — I2510 Atherosclerotic heart disease of native coronary artery without angina pectoris: Secondary | ICD-10-CM | POA: Diagnosis present

## 2013-06-30 DIAGNOSIS — Z7901 Long term (current) use of anticoagulants: Secondary | ICD-10-CM | POA: Diagnosis not present

## 2013-06-30 DIAGNOSIS — E869 Volume depletion, unspecified: Secondary | ICD-10-CM

## 2013-06-30 DIAGNOSIS — E43 Unspecified severe protein-calorie malnutrition: Secondary | ICD-10-CM | POA: Diagnosis not present

## 2013-06-30 DIAGNOSIS — Z794 Long term (current) use of insulin: Secondary | ICD-10-CM

## 2013-06-30 DIAGNOSIS — I129 Hypertensive chronic kidney disease with stage 1 through stage 4 chronic kidney disease, or unspecified chronic kidney disease: Secondary | ICD-10-CM | POA: Diagnosis present

## 2013-06-30 DIAGNOSIS — R1013 Epigastric pain: Secondary | ICD-10-CM

## 2013-06-30 DIAGNOSIS — I10 Essential (primary) hypertension: Secondary | ICD-10-CM | POA: Diagnosis not present

## 2013-06-30 DIAGNOSIS — M109 Gout, unspecified: Secondary | ICD-10-CM | POA: Diagnosis present

## 2013-06-30 DIAGNOSIS — Z6830 Body mass index (BMI) 30.0-30.9, adult: Secondary | ICD-10-CM | POA: Diagnosis not present

## 2013-06-30 DIAGNOSIS — N183 Chronic kidney disease, stage 3 unspecified: Secondary | ICD-10-CM | POA: Diagnosis not present

## 2013-06-30 DIAGNOSIS — F329 Major depressive disorder, single episode, unspecified: Secondary | ICD-10-CM | POA: Diagnosis present

## 2013-06-30 DIAGNOSIS — R110 Nausea: Secondary | ICD-10-CM

## 2013-06-30 DIAGNOSIS — I951 Orthostatic hypotension: Principal | ICD-10-CM | POA: Diagnosis present

## 2013-06-30 DIAGNOSIS — Z87891 Personal history of nicotine dependence: Secondary | ICD-10-CM

## 2013-06-30 DIAGNOSIS — F3289 Other specified depressive episodes: Secondary | ICD-10-CM | POA: Diagnosis present

## 2013-06-30 DIAGNOSIS — E119 Type 2 diabetes mellitus without complications: Secondary | ICD-10-CM | POA: Diagnosis present

## 2013-06-30 DIAGNOSIS — I4891 Unspecified atrial fibrillation: Secondary | ICD-10-CM | POA: Diagnosis not present

## 2013-06-30 DIAGNOSIS — Z8673 Personal history of transient ischemic attack (TIA), and cerebral infarction without residual deficits: Secondary | ICD-10-CM

## 2013-06-30 DIAGNOSIS — G4730 Sleep apnea, unspecified: Secondary | ICD-10-CM | POA: Diagnosis present

## 2013-06-30 DIAGNOSIS — E785 Hyperlipidemia, unspecified: Secondary | ICD-10-CM | POA: Diagnosis present

## 2013-06-30 DIAGNOSIS — N184 Chronic kidney disease, stage 4 (severe): Secondary | ICD-10-CM | POA: Diagnosis present

## 2013-06-30 DIAGNOSIS — K920 Hematemesis: Secondary | ICD-10-CM | POA: Diagnosis not present

## 2013-06-30 DIAGNOSIS — IMO0001 Reserved for inherently not codable concepts without codable children: Secondary | ICD-10-CM | POA: Diagnosis present

## 2013-06-30 DIAGNOSIS — N185 Chronic kidney disease, stage 5: Secondary | ICD-10-CM | POA: Diagnosis present

## 2013-06-30 HISTORY — DX: Cerebral infarction, unspecified: I63.9

## 2013-06-30 HISTORY — DX: Gastrointestinal hemorrhage, unspecified: K92.2

## 2013-06-30 HISTORY — DX: Acute myocardial infarction, unspecified: I21.9

## 2013-06-30 HISTORY — DX: Gastro-esophageal reflux disease without esophagitis: K21.9

## 2013-06-30 HISTORY — DX: Shortness of breath: R06.02

## 2013-06-30 LAB — COMPLETE METABOLIC PANEL WITH GFR
ALT: 14 U/L (ref 0–53)
AST: 19 U/L (ref 0–37)
Albumin: 4 g/dL (ref 3.5–5.2)
CO2: 26 mEq/L (ref 19–32)
Calcium: 9.7 mg/dL (ref 8.4–10.5)
Chloride: 96 mEq/L (ref 96–112)
Creat: 3.14 mg/dL — ABNORMAL HIGH (ref 0.50–1.35)
GFR, Est African American: 23 mL/min — ABNORMAL LOW
Potassium: 3.4 mEq/L — ABNORMAL LOW (ref 3.5–5.3)
Sodium: 137 mEq/L (ref 135–145)
Total Protein: 7.3 g/dL (ref 6.0–8.3)

## 2013-06-30 LAB — GLUCOSE, CAPILLARY: Glucose-Capillary: 395 mg/dL — ABNORMAL HIGH (ref 70–99)

## 2013-06-30 LAB — CBC
Platelets: 244 10*3/uL (ref 150–400)
RBC: 4.88 MIL/uL (ref 4.22–5.81)
RDW: 13.8 % (ref 11.5–15.5)
WBC: 7.1 10*3/uL (ref 4.0–10.5)

## 2013-06-30 LAB — PROTIME-INR
INR: 1.37 (ref <1.50)
Prothrombin Time: 16.5 seconds — ABNORMAL HIGH (ref 11.6–15.2)

## 2013-06-30 MED ORDER — SODIUM CHLORIDE 0.9 % IV SOLN: INTRAVENOUS | Status: DC

## 2013-06-30 MED ORDER — ALLOPURINOL 100 MG PO TABS
200.0000 mg | ORAL_TABLET | Freq: Every day | ORAL | Status: DC
  Administered 2013-06-30 – 2013-07-02 (×3): 200 mg via ORAL
  Filled 2013-06-30 (×3): qty 2

## 2013-06-30 MED ORDER — SODIUM CHLORIDE 0.9 % IJ SOLN
3.0000 mL | Freq: Two times a day (BID) | INTRAMUSCULAR | Status: DC
  Administered 2013-06-30 – 2013-07-01 (×3): 3 mL via INTRAVENOUS

## 2013-06-30 MED ORDER — ONDANSETRON HCL 4 MG/2ML IJ SOLN: 4.0000 mg | Freq: Four times a day (QID) | INTRAMUSCULAR | Status: DC | PRN

## 2013-06-30 MED ORDER — PANTOPRAZOLE SODIUM 40 MG IV SOLR
40.0000 mg | Freq: Two times a day (BID) | INTRAVENOUS | Status: DC
  Administered 2013-07-01 (×2): 40 mg via INTRAVENOUS
  Filled 2013-06-30 (×3): qty 40

## 2013-06-30 MED ORDER — INSULIN ASPART 100 UNIT/ML ~~LOC~~ SOLN
0.0000 [IU] | SUBCUTANEOUS | Status: DC
  Administered 2013-06-30 – 2013-07-01 (×3): 3 [IU] via SUBCUTANEOUS
  Administered 2013-07-01: 2 [IU] via SUBCUTANEOUS
  Administered 2013-07-01 (×2): 1 [IU] via SUBCUTANEOUS

## 2013-06-30 MED ORDER — FAMOTIDINE 20 MG PO TABS
20.0000 mg | ORAL_TABLET | Freq: Two times a day (BID) | ORAL | Status: DC | PRN
  Filled 2013-06-30: qty 1

## 2013-06-30 MED ORDER — CLONIDINE HCL 0.3 MG PO TABS
0.3000 mg | ORAL_TABLET | Freq: Three times a day (TID) | ORAL | Status: DC
  Administered 2013-06-30 – 2013-07-02 (×5): 0.3 mg via ORAL
  Filled 2013-06-30 (×7): qty 1

## 2013-06-30 MED ORDER — ONDANSETRON HCL 4 MG PO TABS: 4.0000 mg | ORAL_TABLET | Freq: Four times a day (QID) | ORAL | Status: DC | PRN

## 2013-06-30 MED ORDER — SIMVASTATIN 40 MG PO TABS
40.0000 mg | ORAL_TABLET | Freq: Every day | ORAL | Status: DC
  Filled 2013-06-30: qty 1

## 2013-06-30 NOTE — Progress Notes (Signed)
Unable to get an IV at this time.  Since the beginning of shift 2 RNs have each attempted twice.  IV team has been paged.  Awaiting IV team to come place IV.

## 2013-06-30 NOTE — Progress Notes (Addendum)
Patient: Patrick [name]   MRN: [id number]  DOB: 1945/11/01  PCP: Marrion Coy, MD   Subjective:    CC: Follow-up and Diabetes   HPI: Patrick [name] is a 67 y.o. male with a PMHx as outlined below, who presented to clinic today for the following. Patient is a poor historian and the history is provided by him and his wife.   1) Hematemesis and epigastric pain.   Patient states that he started to feel nausea over 10 days ago and had two large bloody emesis on 06/24/13. He was evaluated at [facility] ED. His lipase was 73 thought to be related to his vomiting.  He was also noted to have mild hypokalemia with K of 3.0. His HGB was stable at 13.7/39.7. INR was 3.11. He was asked to stop Coumadin and discharged from ED. He states that he continues to have nausea, vomiting and epigastric pain. He stopped all of his medications due to nausea. When he attempted to take medications yesterday, he vomited all out.  Last vomitus was nonbloody yesterday on 06/30/13. He reports decreased oral fluid intake and unable to eat any solid food. He started to feel generalized weakness and have mild SOB. Denies fever, chills, rhinorrhea, sore throat, cough, chest pain/pressure or diarrhea.   Of not, he states that he had not been taking his insulin for at least over 2 weeks. He states, " insulin makes me feel bad."  2) Gout: Patient has a chronic history of gout and is on Allopurinol. He has not had Allopurinol for over one week. He states that he started to have left foot pain 2 days ago.   Review of Systems: Per HPI.   Current Outpatient Medications: Current Outpatient Prescriptions  Medication Sig Dispense Refill  . allopurinol (ZYLOPRIM) 100 MG tablet Take 2 tablets (200 mg total) by mouth daily.  60 tablet  2  . aspirin 81 MG EC tablet Take 81 mg by mouth daily.        . Blood Glucose Monitoring Suppl (ACCU-CHEK AVIVA PLUS) W/DEVICE KIT 1 each by Does not apply route as needed.  1 kit  0  . cloNIDine  (CATAPRES) 0.3 MG tablet Take 1 tablet (0.3 mg total) by mouth 2 (two) times daily.  180 tablet  1  . diltiazem (CARDIZEM) 90 MG tablet Take 1 tablet (90 mg total) by mouth 2 (two) times daily.  180 tablet  2  . famotidine (PEPCID) 20 MG tablet Take 1 tablet (20 mg total) by mouth 2 (two) times daily as needed for heartburn.  62 tablet  5  . furosemide (LASIX) 80 MG tablet Take 1 tablet (80 mg total) by mouth 2 (two) times daily.  60 tablet  6  . glipiZIDE (GLUCOTROL XL) 10 MG 24 hr tablet Take 1 tablet (10 mg total) by mouth daily.  30 tablet  3  . glucose blood (TRUETRACK TEST) test strip Use to test blood sugar 2-3 times daily dx code 250.00  100 each  11  . insulin NPH (HUMULIN N) 100 UNIT/ML injection Inject subcutaneous 20 units every morning, and 10 units every evening  10 mL  11  . Insulin Syringe-Needle U-100 (ELITE-THIN INS SYR .5CC/31G) 31G X 5/16" 0.5 ML MISC 1 each by Does not apply route 3 (three) times daily. Use to inject insulin daily  100 each  11  . Lancets 30G MISC Use to test blood glucose 2-3 times daily dx code 250.00  100 each  11  .  Omega-3 Fatty Acids (FISH OIL) 1000 MG CAPS Take 1 capsule by mouth 2 (two) times daily.        . potassium chloride (MICRO-K) 10 MEQ CR capsule Take 1 capsule (10 mEq total) by mouth 2 (two) times daily.  60 capsule  3  . pravastatin (PRAVACHOL) 80 MG tablet Take 1 tablet (80 mg total) by mouth daily.  30 tablet  5  . warfarin (COUMADIN) 5 MG tablet Take 1/2 tablets Mon/Wed/Friday; Take 1 tablet Sun/Tues/Thurs/Sat  30 tablet  2   No current facility-administered medications for this visit.    Allergies: No Known Allergies  Past Medical History  Diagnosis Date  . Diabetes mellitus type II     Insulin dependent  . Hypertension   . Sleep apnea 07/2010  . CKD (chronic kidney disease), stage II   . Paroxysmal atrial fibrillation     on coumadin  . CAD (coronary artery disease)   . Depression   . Gout   . Hyperlipidemia   . 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 Atrial fibrillation     Objective:    Physical Exam: Filed Vitals:   06/30/13 1508  BP: 157/98  Pulse: 78  Temp: 96.8 F (36 C)  TempSrc: Oral  Height: 5\' 8"  (1.727 m)  Weight: 197 lb 11.2 oz (89.676 kg)  SpO2: 97%    Orthostatic VS ( Pulse is not same as HR since he is in A fib with RVR) Lying BP 197/111, P 69 Sitting BP 185/119, P 69 Standing BP 153/96, P 67  General: Acute sickness appearing   Head: Normocephalic, atraumatic.  Lungs:  Normal respiratory effort. Clear to auscultation BL without crackles or wheezes.  Heart:  irregularly irregular. S1 and S2 normal without gallop, rubs, murmur.  Abdomen:  BS normoactive. Soft, Nondistended, non-tender.  No masses or organomegaly.  Extremities: No pretibial edema.   Assessment/ Plan:     1. Volume depletion and Afib with RVR     Patient is a 67 year old man with a PMH of T2DM, HTN, A Fib on coumadin, and CKD III, who presents to the clinic for N/V/blood emesis.     Patient is noted to have positive orthostatic hypotension with SBP dropping over 40 digits, which likely an indication of volume depletion in the setting of N/V and inability to keep oral intake. He is also noted to have A fib with RVR likely associated with his volume depletion and inability to take his CCB.   Plan - Admit for Tele OB - Will need IVF hydration - symptomatic treatment for nausea.  - resume his oral medications when possible.  2. Nausea, bloody emesis and epigastric pain    The etiology is unclear. He is not compliant with his insulin therapy, which raises concerns over possible gastroparesis. Acute viral/bacterial gastroenteritis is likely but he doesn't have any diarrhea.  Acute pancreatitis is unlikely and his lipase was slightly elevated which is likely associated with N/V.      With regards to his hematemesis, we will check his CBC.  He'll benefit from  GI workup /EGD either inpatient (if CBC dropped significantly or persistent hematemesis) or outpatient.  His chronic anticoagulation therapy certainly contribute to his GIB.  Plan: - CBC, BMP, INR pending - admit to Tele OB

## 2013-06-30 NOTE — Patient Instructions (Signed)
Admit to hospital 

## 2013-06-30 NOTE — Telephone Encounter (Signed)
Flag sent to front desk pool for appt this week per Dr Marinda Elk.

## 2013-06-30 NOTE — H&P (Signed)
Date: 07/01/2013               Patient Name:  Patrick [name] MRN: [id number]  DOB: 1945/08/30 Age / Sex: 67 y.o., male   PCP: Marrion Coy, MD         Medical Service: Internal Medicine Teaching Service         Attending Physician: Dr. Bartholomew Crews, MD    First Contact: Dr. Gordy Levan Pager: [phone]  Second Contact: Dr. Alice Rieger    Pager: [phone]       After Hours (After 5p/  First Contact Pager: [phone]  weekends / holidays): Second Contact Pager: [phone]   Chief Complaint: Vomiting blood  History of Present Illness: Pt is a 67 yo with a PMH of DM2 (04/07/13 HA1C: 8.9), HTN, CVA, paroxysmal atrial fibrillation, CKD2, hyperlipidemia, and gout.  He presented to the clinic with bloody emesis since last Wednesday.  The history was provided by his wife d/t pt having difficulty with communication since his previous CVA.  She states that he began vomiting blood last Wednesday that was about 2 cups according to the husband.  He denied any fever/chills, cough, CP, or constipation.  She does report that he has experienced weakness and mild SOB recently but denies palpitations.  She states he has decreased po intake since last Wednesday and has only taken sips of ensure.  He also c/o epigastric abdominal pain since Wednesday.  He is unsure if the pain is worse or better with food.  He also had several brown loose stools without visible blood.  Of note, he was evaluated at [facility] ED last Wednesday for hematemesis.  Lipase was 73 which is not significantly elevated.  He was also noted to have mild hypokalemia with K of 3.0 and a stable H/H of 13.7/39.7. INR was 3.11.  It was requested that he stop his coumadin and was discharged from ED. However, he continued to have N/V with associated epigastric pain.  He stopped all of his medications due to nausea. He attempted to take medications yesterday, but vomited them all.  He has not been taking his insulin for at least over 2 weeks because it makes him  feel bad.    Meds: Current Facility-Administered Medications  Medication Dose Route Frequency Provider Last Rate Last Dose  . allopurinol (ZYLOPRIM) tablet 200 mg  200 mg Oral Daily Jessee Avers, MD   200 mg at 06/30/13 2057  . cloNIDine (CATAPRES) tablet 0.3 mg  0.3 mg Oral TID Jessee Avers, MD   0.3 mg at 06/30/13 2216  . insulin aspart (novoLOG) injection 0-9 Units  0-9 Units Subcutaneous Q4H Ivor Costa, MD   3 Units at 07/01/13 0011  . ondansetron (ZOFRAN) tablet 4 mg  4 mg Oral Q6H PRN Jessee Avers, MD       Or  . ondansetron [facility]) injection 4 mg  4 mg Intravenous Q6H PRN Jessee Avers, MD      . pantoprazole (PROTONIX) injection 40 mg  40 mg Intravenous Q12H Jessee Avers, MD   40 mg at 07/01/13 0006  . simvastatin (ZOCOR) tablet 40 mg  40 mg Oral q1800 Jessee Avers, MD      . sodium chloride 0.9 % injection 3 mL  3 mL Intravenous Q12H Jessee Avers, MD   3 mL at 06/30/13 2101   Prescriptions prior to admission  Medication Sig Dispense Refill  . allopurinol (ZYLOPRIM) 100 MG tablet Take 2 tablets (200 mg total) by mouth daily.  60 tablet  2  . aspirin 81 MG EC tablet Take 81 mg by mouth daily.        . cloNIDine (CATAPRES) 0.3 MG tablet Take 1 tablet (0.3 mg total) by mouth 2 (two) times daily.  180 tablet  1  . diltiazem (CARDIZEM) 90 MG tablet Take 1 tablet (90 mg total) by mouth 2 (two) times daily.  180 tablet  2  . famotidine (PEPCID) 20 MG tablet Take 1 tablet (20 mg total) by mouth 2 (two) times daily as needed for heartburn.  62 tablet  5  . furosemide (LASIX) 80 MG tablet Take 80 mg by mouth 2 (two) times daily.      Marland Kitchen glipiZIDE (GLUCOTROL XL) 10 MG 24 hr tablet Take 1 tablet (10 mg total) by mouth daily.  30 tablet  3  . insulin NPH (HUMULIN N,NOVOLIN N) 100 UNIT/ML injection Inject 10-20 Units into the skin 2 (two) times daily. Takes 20 units in the morning and 10 units in the evening.      . Omega-3 Fatty Acids (FISH OIL) 1000 MG CAPS Take 1 capsule by  mouth 2 (two) times daily.        . potassium chloride (MICRO-K) 10 MEQ CR capsule Take 1 capsule (10 mEq total) by mouth 2 (two) times daily.  60 capsule  3  . pravastatin (PRAVACHOL) 80 MG tablet Take 1 tablet (80 mg total) by mouth daily.  30 tablet  5  . Blood Glucose Monitoring Suppl (ACCU-CHEK AVIVA PLUS) W/DEVICE KIT 1 each by Does not apply route as needed.  1 kit  0  . glucose blood (TRUETRACK TEST) test strip Use to test blood sugar 2-3 times daily dx code 250.00  100 each  11  . Insulin Syringe-Needle U-100 (ELITE-THIN INS SYR .5CC/31G) 31G X 5/16" 0.5 ML MISC 1 each by Does not apply route 3 (three) times daily. Use to inject insulin daily  100 each  11  . Lancets 30G MISC Use to test blood glucose 2-3 times daily dx code 250.00  100 each  11  . warfarin (COUMADIN) 5 MG tablet Take 2.5-5 mg by mouth daily. Take 2.5 mg on Monday, Wednesday, and Friday and 5 mg on Sunday, Tuesday, Thursday, Saturday.        Allergies: Allergies as of 06/30/2013  . (No Known Allergies)   Past Medical History  Diagnosis Date  . Diabetes mellitus type II     Insulin dependent  . Hypertension   . Sleep apnea 07/2010  . CKD (chronic kidney disease), stage II   . Paroxysmal atrial fibrillation     on coumadin  . CAD (coronary artery disease)   . Depression   . Gout   . Hyperlipidemia   . 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 Atrial fibrillation    No past surgical history on file. No family history on file. History   Social History  . Marital Status: Married    [name]    Number of Children: N/A  . Years of Education: 5   Occupational History  .     Social History Main Topics  . Smoking status: Former Smoker    Types: Cigarettes    Quit date: 10/26/1976  . Smokeless tobacco: Never Used  . Alcohol Use: No  . Drug Use: No  . Sexual Activity: Not on file   Other Topics Concern  . Not on file   Social  History  Narrative  . No narrative on file    Review of Systems:  Review of Systems  Constitutional: Positive for malaise/fatigue. Negative for fever, weight loss and diaphoresis.  Respiratory: Positive for shortness of breath. Negative for cough.   Cardiovascular: Negative for chest pain, palpitations, orthopnea, claudication, leg swelling and PND.  Gastrointestinal: Positive for nausea, vomiting, abdominal pain and diarrhea. Negative for heartburn, constipation and blood in stool.  Genitourinary: Negative for dysuria.  Skin: Negative for rash.  Neurological: Positive for weakness (generalized). Negative for dizziness and headaches.  Endo/Heme/Allergies: Does not bruise/bleed easily.    Physical Exam: Blood pressure 180/93, pulse 87, temperature 98.1 F (36.7 C), temperature source Oral, resp. rate 18, SpO2 97.00%.  Physical Exam  Constitutional: He is well-developed, well-nourished, and in no distress. No distress.  HENT:  Head: Normocephalic and atraumatic.  Eyes: Conjunctivae and EOM are normal. Pupils are equal, round, and reactive to light. No scleral icterus.  Neck: Neck supple.  Cardiovascular: Normal heart sounds and intact distal pulses.  An irregularly irregular rhythm present. Tachycardia present.  Exam reveals no gallop and no friction rub.   No murmur heard. Pulmonary/Chest: Effort normal. No respiratory distress. He has no wheezes. He has no rales.  Abdominal: Soft. Normal appearance and bowel sounds are normal. He exhibits no distension. There is no tenderness. There is no rebound.  Neurological: He is alert.  Skin: Skin is warm and dry. No rash noted. He is not diaphoretic.    Lab results: Basic Metabolic Panel:  Recent Labs  06/30/13 1526  NA 137  K 3.4*  CL 96  CO2 26  GLUCOSE 346*  BUN 32*  CREATININE 3.14*  CALCIUM 9.7   Liver Function Tests:  Recent Labs  06/30/13 1526  AST 19  ALT 14  ALKPHOS 116  BILITOT 0.7  PROT 7.3  ALBUMIN 4.0     Recent Labs  06/30/13 1526  LIPASE 120*   CBC:  Recent Labs  06/30/13 1526  WBC 7.1  HGB 13.3  HCT 39.6  MCV 81.1  PLT 244   CBG:  Recent Labs  06/30/13 1436 06/30/13 2035 07/01/13 0007 07/01/13 0421  GLUCAP 395* 254* 209* 106*   Coagulation:  Recent Labs  06/30/13 1526  LABPROT 16.5*  INR 1.37   Urine Drug Screen: Drugs of Abuse     Component Value Date/Time   LABOPIA NONE DETECTED 11/22/2010 0831   COCAINSCRNUR NONE DETECTED 11/22/2010 0831   LABBENZ NONE DETECTED 11/22/2010 0831   AMPHETMU NONE DETECTED 11/22/2010 0831   THCU NONE DETECTED 11/22/2010 0831   LABBARB  Value: NONE DETECTED        DRUG SCREEN FOR MEDICAL PURPOSES ONLY.  IF CONFIRMATION IS NEEDED FOR ANY PURPOSE, NOTIFY LAB WITHIN 5 DAYS.        LOWEST DETECTABLE LIMITS FOR URINE DRUG SCREEN Drug Class       Cutoff (ng/mL) Amphetamine      1000 Barbiturate      200 Benzodiazepine   [id number] Tricyclics       [id number] Opiates          300 Cocaine          300 THC              50 11/22/2010 0831    Imaging results:  No results found.  Other results:  ED ECG REPORT   Date: 07/01/2013  EKG Time: 15:38  Rate: 117  Rhythm: atrial fibrillation  Axis: Left  Intervals:none  ST&T Change: none  Narrative Interpretation: Unchanged from previous EKG    Assessment & Plan by Problem:  Pt is a 67 yo with a PMH of DM2 (04/07/13 HA1C: 8.9), HTN, CVA, paroxysmal atrial fibrillation, CKD2, hyperlipidemia, and gout who presented to the clinic today with bloody emesis and decreased po intake since last Wednesday.   Principal Problem:   Orthostatic hypotension Active Problems:   Type II diabetes mellitus,  uncontrolled   GOUT   HYPERTENSION   ATRIAL FIBRILLATION, PAROXYSMAL, CHRONIC   Chronic kidney disease (CKD), stage III (moderate)   Long term current use of anticoagulant   Hematemesis   Orthostasis  Volume depletion and Afib with RVR  Patient is noted to have positive orthostatic hypotension with SBP  dropping over 40 digits, which likely an indication of volume depletion in the setting of N/V and inability to keep oral intake. He is also noted to have A fib with RVR likely associated with his volume depletion and inability to take his CCB.   - Admit to telemetry  - Hold lasix, diltiazem given positive orthostatics - zofran for nausea - medications with oral sips   Hematemesis and epigastric pain  Possibilities include an peptic ulcer given his ASA use and associated epigastric pain.  Acute viral/bacterial gastroenteritis is possible but he is without diarrhea. Acute pancreatitis is unlikely given his lipase was only slightly elevated.  However, an elevated lipase raises the possibility of an ulcer.  - hold coumadin dose  - Protonix 40mg  IV - Zofran 4mg  IV PRN  - CBC, BMP, INR pending  - FOBT - PT/INR - GI consult as an inpatient/outpatient  HTN -continue clonidine as pt may experience rebound HTN  DM2 - SSI sensitive  HTN - Hold hypertensives except for clonidine due to possible rebound HTN  Gout - Continue allopurinol  Dispo: Disposition is deferred at this time, awaiting improvement of current medical problems. Anticipated discharge in approximately 1-2 day(s).   The patient does have a current PCP Marrion Coy, MD) and does need an [facility] hospital follow-up appointment after discharge.   Signed: Michail Jewels, MD 07/01/2013, 5:43 AM

## 2013-07-01 ENCOUNTER — Encounter (HOSPITAL_COMMUNITY): Payer: Self-pay | Admitting: General Practice

## 2013-07-01 ENCOUNTER — Other Ambulatory Visit: Payer: Self-pay

## 2013-07-01 DIAGNOSIS — K922 Gastrointestinal hemorrhage, unspecified: Secondary | ICD-10-CM

## 2013-07-01 DIAGNOSIS — E43 Unspecified severe protein-calorie malnutrition: Secondary | ICD-10-CM | POA: Insufficient documentation

## 2013-07-01 HISTORY — DX: Gastrointestinal hemorrhage, unspecified: K92.2

## 2013-07-01 LAB — COMPREHENSIVE METABOLIC PANEL
ALT: 10 U/L (ref 0–53)
AST: 14 U/L (ref 0–37)
CO2: 30 mEq/L (ref 19–32)
Chloride: 102 mEq/L (ref 96–112)
GFR calc non Af Amer: 21 mL/min — ABNORMAL LOW (ref >90)
Sodium: 142 mEq/L (ref 135–145)
Total Bilirubin: 0.7 mg/dL (ref 0.3–1.2)

## 2013-07-01 LAB — BASIC METABOLIC PANEL
CO2: 27 mEq/L (ref 19–32)
Calcium: 8.5 mg/dL (ref 8.4–10.5)
Glucose, Bld: 189 mg/dL — ABNORMAL HIGH (ref 70–99)
Potassium: 3.4 mEq/L — ABNORMAL LOW (ref 3.5–5.1)
Sodium: 138 mEq/L (ref 135–145)

## 2013-07-01 LAB — CBC
HCT: 35.6 % — ABNORMAL LOW (ref 39.0–52.0)
Hemoglobin: 12.1 g/dL — ABNORMAL LOW (ref 13.0–17.0)
MCH: 26.9 pg (ref 26.0–34.0)
MCHC: 34 g/dL (ref 30.0–36.0)
MCV: 79.3 fL (ref 78.0–100.0)
RDW: 13.9 % (ref 11.5–15.5)
WBC: 4.8 10*3/uL (ref 4.0–10.5)

## 2013-07-01 LAB — TROPONIN I: Troponin I: <0.3 ng/mL (ref <0.30)

## 2013-07-01 LAB — GLUCOSE, CAPILLARY
Glucose-Capillary: 131 mg/dL — ABNORMAL HIGH (ref 70–99)
Glucose-Capillary: 174 mg/dL — ABNORMAL HIGH (ref 70–99)
Glucose-Capillary: 209 mg/dL — ABNORMAL HIGH (ref 70–99)
Glucose-Capillary: 237 mg/dL — ABNORMAL HIGH (ref 70–99)

## 2013-07-01 MED ORDER — WARFARIN SODIUM 5 MG PO TABS
5.0000 mg | ORAL_TABLET | Freq: Once | ORAL | Status: AC
  Administered 2013-07-01: 5 mg via ORAL
  Filled 2013-07-01: qty 1

## 2013-07-01 MED ORDER — INSULIN ASPART 100 UNIT/ML ~~LOC~~ SOLN: 0.0000 [IU] | Freq: Three times a day (TID) | SUBCUTANEOUS | Status: DC

## 2013-07-01 MED ORDER — DILTIAZEM HCL 30 MG PO TABS
30.0000 mg | ORAL_TABLET | Freq: Two times a day (BID) | ORAL | Status: DC
  Administered 2013-07-01 – 2013-07-02 (×2): 30 mg via ORAL
  Filled 2013-07-01 (×3): qty 1

## 2013-07-01 MED ORDER — WARFARIN - PHARMACIST DOSING INPATIENT
Freq: Every day | Status: DC
  Administered 2013-07-01: 18:00:00

## 2013-07-01 MED ORDER — PANTOPRAZOLE SODIUM 40 MG PO TBEC
40.0000 mg | DELAYED_RELEASE_TABLET | Freq: Two times a day (BID) | ORAL | Status: DC
  Administered 2013-07-01 – 2013-07-02 (×2): 40 mg via ORAL
  Filled 2013-07-01 (×2): qty 1

## 2013-07-01 MED ORDER — SODIUM CHLORIDE 0.9 % IV SOLN
INTRAVENOUS | Status: DC
  Administered 2013-07-01: 75 mL/h via INTRAVENOUS

## 2013-07-01 MED ORDER — WARFARIN SODIUM 2.5 MG PO TABS: 2.5000 mg | ORAL_TABLET | Freq: Every day | ORAL | Status: DC

## 2013-07-01 MED ORDER — GLUCERNA SHAKE PO LIQD
237.0000 mL | Freq: Two times a day (BID) | ORAL | Status: DC
  Administered 2013-07-01 – 2013-07-02 (×3): 237 mL via ORAL

## 2013-07-01 MED ORDER — ATORVASTATIN CALCIUM 20 MG PO TABS
20.0000 mg | ORAL_TABLET | Freq: Every day | ORAL | Status: DC
  Administered 2013-07-01: 20 mg via ORAL
  Filled 2013-07-01 (×2): qty 1

## 2013-07-01 MED ORDER — POTASSIUM CHLORIDE 10 MEQ/100ML IV SOLN
10.0000 meq | INTRAVENOUS | Status: AC
  Administered 2013-07-01 (×4): 10 meq via INTRAVENOUS
  Filled 2013-07-01 (×4): qty 100

## 2013-07-01 NOTE — Progress Notes (Signed)
UR Completed Kahleel Fadeley Graves-Bigelow, RN,BSN [phone]  

## 2013-07-01 NOTE — Progress Notes (Addendum)
Subjective:  Pt reports no pain this AM.  He has not vomited overnight but apparently vomited when given his medication this morning.  He denies any CP, SOB, N/V, or dysuria.  Wife at bedside reports pt has not been eating well over the past several weeks and is concerned about this.   Objective: Vital signs in last 24 hours: Filed Vitals:   07/01/13 0424 07/01/13 1018 07/01/13 1025 07/01/13 1029  BP: 180/93 165/106 144/98 145/129  Pulse: 87     Temp: 98.1 F (36.7 C)     TempSrc: Oral     Resp: 18     SpO2: 97%      Weight change:  No intake or output data in the 24 hours ending 07/01/13 1141  Physical Exam  Constitutional: He is well-developed, well-nourished, and in no distress.  HENT:  Head: Normocephalic and atraumatic.  Eyes: Conjunctivae and EOM are normal. Pupils are equal, round, and reactive to light. No scleral icterus.  Neck: Neck supple.  Cardiovascular: Normal heart sounds and intact distal pulses. An irregularly irregular rhythm present. Tachycardia present. Exam reveals no gallop and no friction rub.  No murmur heard.  Pulmonary/Chest: Effort normal. No respiratory distress. He has no wheezes. He has no rales.  Abdominal: Soft. Normal appearance and bowel sounds are normal. He exhibits no distension. There is no tenderness. There is no rebound.  Neurological: He is alert.  Skin: Skin is warm and dry. No rash noted. He is not diaphoretic.    Lab Results: Basic Metabolic Panel:  Recent Labs Lab 06/30/13 1526 07/01/13 0449  NA 137 142  K 3.4* 2.9*  CL 96 102  CO2 26 30  GLUCOSE 346* 114*  BUN 32* 32*  CREATININE 3.14* 2.89*  CALCIUM 9.7 9.3   Liver Function Tests:  Recent Labs Lab 06/30/13 1526 07/01/13 0449  AST 19 14  ALT 14 10  ALKPHOS 116 97  BILITOT 0.7 0.7  PROT 7.3 6.4  ALBUMIN 4.0 3.4*    Recent Labs Lab 06/24/13 2120 06/30/13 1526  LIPASE 73* 120*   No results found for this basename: AMMONIA,  in the last 168  hours CBC:  Recent Labs Lab 06/30/13 1526 07/01/13 0449  WBC 7.1 4.8  HGB 13.3 12.1*  HCT 39.6 35.6*  MCV 81.1 79.3  PLT 244 213   Cardiac Enzymes:  Recent Labs Lab 06/24/13 2348 07/01/13 1028  TROPONINI <0.30 <0.30   CBG:  Recent Labs Lab 06/24/13 2117 06/30/13 1436 06/30/13 2035 07/01/13 0007 07/01/13 0421 07/01/13 0743  GLUCAP 267* 395* 254* 209* 106* 131*   Coagulation:  Recent Labs Lab 06/24/13 2120 06/30/13 1526 07/01/13 0449  LABPROT 30.9* 16.5* 15.5*  INR 3.11* 1.37 1.26   Anemia Panel: No results found for this basename: VITAMINB12, FOLATE, FERRITIN, TIBC, IRON, RETICCTPCT,  in the last 168 hours Urine Drug Screen: Drugs of Abuse     Component Value Date/Time   LABOPIA NONE DETECTED 11/22/2010 0831   COCAINSCRNUR NONE DETECTED 11/22/2010 0831   LABBENZ NONE DETECTED 11/22/2010 0831   AMPHETMU NONE DETECTED 11/22/2010 0831   THCU NONE DETECTED 11/22/2010 0831   LABBARB  Value: NONE DETECTED        DRUG SCREEN FOR MEDICAL PURPOSES ONLY.  IF CONFIRMATION IS NEEDED FOR ANY PURPOSE, NOTIFY LAB WITHIN 5 DAYS.        LOWEST DETECTABLE LIMITS FOR URINE DRUG SCREEN Drug Class       Cutoff (ng/mL) Amphetamine  1000 Barbiturate      200 Benzodiazepine   [id number] Tricyclics       [id number] Opiates          300 Cocaine          300 THC              50 11/22/2010 0831    Urinalysis: No results found for this basename: COLORURINE, APPERANCEUR, LABSPEC, PHURINE, GLUCOSEU, HGBUR, BILIRUBINUR, KETONESUR, PROTEINUR, UROBILINOGEN, NITRITE, LEUKOCYTESUR,  in the last 168 hours  Micro Results: No results found for this or any previous visit (from the past 240 hour(s)). Studies/Results: No results found. Medications: I have reviewed the patient's current medications. Scheduled Meds: . allopurinol  200 mg Oral Daily  . cloNIDine  0.3 mg Oral TID  . feeding supplement (GLUCERNA SHAKE)  237 mL Oral BID BM  . insulin aspart  0-9 Units Subcutaneous Q4H  . pantoprazole  40 mg Oral  BID WC  . potassium chloride  10 mEq Intravenous Q1 Hr x 4  . simvastatin  40 mg Oral q1800  . sodium chloride  3 mL Intravenous Q12H   Continuous Infusions:  PRN Meds:.ondansetron (ZOFRAN) IV, ondansetron Assessment/Plan: Principal Problem:   Orthostatic hypotension Active Problems:   Type II diabetes mellitus,  uncontrolled   GOUT   HYPERTENSION   ATRIAL FIBRILLATION, PAROXYSMAL, CHRONIC   Chronic kidney disease (CKD), stage III (moderate)   Long term current use of anticoagulant   Hematemesis   Orthostasis   Protein-calorie malnutrition, severe  Orthostatic Hypotension   Patient is noted to have positive orthostatic hypotension with SBP dropping over 40 digits, which likely an indication of volume depletion in the setting of N/V and inability to keep oral intake. He is also noted to have A fib with RVR likely associated with his volume depletion and inability to take his CCB.  - Recheck orthostatics - Continue holding lasix and diltiazem given previous positive orthostatics  - continue zofran for nausea  - troponin x 1 is negative - PT to evaluate - Nutrition consult   CMP     Component Value Date/Time   NA 142 07/01/2013 0449   K 2.9* 07/01/2013 0449   CL 102 07/01/2013 0449   CO2 30 07/01/2013 0449   GLUCOSE 114* 07/01/2013 0449   BUN 32* 07/01/2013 0449   CREATININE 2.89* 07/01/2013 0449   CREATININE 3.14* 06/30/2013 1526   CALCIUM 9.3 07/01/2013 0449   PROT 6.4 07/01/2013 0449   ALBUMIN 3.4* 07/01/2013 0449   AST 14 07/01/2013 0449   ALT 10 07/01/2013 0449   ALKPHOS 97 07/01/2013 0449   BILITOT 0.7 07/01/2013 0449   GFRNONAA 21* 07/01/2013 0449   GFRAA 25* 07/01/2013 0449   Lab Results  Component Value Date   WBC 4.8 07/01/2013   HGB 12.1* 07/01/2013   HCT 35.6* 07/01/2013   MCV 79.3 07/01/2013   PLT 213 07/01/2013   Lab Results  Component Value Date   INR 1.26 07/01/2013   INR 1.37 06/30/2013   INR 3.11* 06/24/2013    Hematemesis  No further episodes of hematemesis  today or overnight.  Possibilities include an peptic ulcer given his ASA use and associated epigastric pain. Acute viral/bacterial gastroenteritis is possible but he is without diarrhea. Acute pancreatitis is unlikely given his lipase was only slightly elevated. However, an elevated lipase raises the possibility of an ulcer.  Gastroparesis is unlikely given acute presentation.  - please have nurse give medications with foods - Protonix 40mg  IV  -  Zofran 4mg  IV PRN  - FOBT pending - Consider GI consult as an inpatient/outpatient  - Nutrition consult recommends and ordered Glucerna Shake PO BID for severe malnutrition - changed diet to regular diet  HTN  -continue clonidine as pt may experience rebound HTN  DM2 -continue SSI sensitive   Lab Results  Component Value Date   HGBA1C 8.9 04/07/2013     Dispo: Disposition is deferred at this time, awaiting improvement of current medical problems.  Anticipated discharge in approximately 1-2 day(s).   The patient does have a current PCP Marrion Coy, MD) and does need an [facility] hospital follow-up appointment after discharge.   .Services Needed at time of discharge: Y = Yes, Blank = No PT:   OT:   RN:   Equipment:   Other:     LOS: 1 day   Michail Jewels, MD 07/01/2013, 11:41 AM

## 2013-07-01 NOTE — Progress Notes (Signed)
ANTICOAGULATION CONSULT NOTE - Initial Consult  Pharmacy Consult for Coumadin Indication: atrial fibrillation  No Known Allergies  Patient Measurements:    Vital Signs: Temp: 98.1 F (36.7 C) (12/03 0424) Temp src: Oral (12/03 0424) BP: 145/129 mmHg (12/03 1029) Pulse Rate: 87 (12/03 0424)  Labs:  Recent Labs  06/30/13 1526 07/01/13 0449 07/01/13 1028  HGB 13.3 12.1*  --   HCT 39.6 35.6*  --   PLT 244 213  --   LABPROT 16.5* 15.5*  --   INR 1.37 1.26  --   CREATININE 3.14* 2.89*  --   TROPONINI  --   --  <0.30    The CrCl is unknown because both a height and weight (above a minimum accepted value) are required for this calculation.   Medical History: Past Medical History  Diagnosis Date  . Diabetes mellitus type II     Insulin dependent  . Hypertension   . Sleep apnea 07/2010  . CKD (chronic kidney disease), stage II   . Paroxysmal atrial fibrillation     on coumadin  . CAD (coronary artery disease)   . Depression   . Gout   . Hyperlipidemia   . 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 Atrial fibrillation     Medications:  Prescriptions prior to admission  Medication Sig Dispense Refill  . allopurinol (ZYLOPRIM) 100 MG tablet Take 2 tablets (200 mg total) by mouth daily.  60 tablet  2  . aspirin 81 MG EC tablet Take 81 mg by mouth daily.        . cloNIDine (CATAPRES) 0.3 MG tablet Take 1 tablet (0.3 mg total) by mouth 2 (two) times daily.  180 tablet  1  . diltiazem (CARDIZEM) 90 MG tablet Take 1 tablet (90 mg total) by mouth 2 (two) times daily.  180 tablet  2  . famotidine (PEPCID) 20 MG tablet Take 1 tablet (20 mg total) by mouth 2 (two) times daily as needed for heartburn.  62 tablet  5  . furosemide (LASIX) 80 MG tablet Take 80 mg by mouth 2 (two) times daily.      Marland Kitchen glipiZIDE (GLUCOTROL XL) 10 MG 24 hr tablet Take 1 tablet (10 mg total) by mouth daily.  30 tablet  3  . insulin NPH (HUMULIN  N,NOVOLIN N) 100 UNIT/ML injection Inject 10-20 Units into the skin 2 (two) times daily. Takes 20 units in the morning and 10 units in the evening.      . Omega-3 Fatty Acids (FISH OIL) 1000 MG CAPS Take 1 capsule by mouth 2 (two) times daily.        . potassium chloride (MICRO-K) 10 MEQ CR capsule Take 1 capsule (10 mEq total) by mouth 2 (two) times daily.  60 capsule  3  . pravastatin (PRAVACHOL) 80 MG tablet Take 1 tablet (80 mg total) by mouth daily.  30 tablet  5  . Blood Glucose Monitoring Suppl (ACCU-CHEK AVIVA PLUS) W/DEVICE KIT 1 each by Does not apply route as needed.  1 kit  0  . glucose blood (TRUETRACK TEST) test strip Use to test blood sugar 2-3 times daily dx code 250.00  100 each  11  . Insulin Syringe-Needle U-100 (ELITE-THIN INS SYR .5CC/31G) 31G X 5/16" 0.5 ML MISC 1 each by Does not apply route 3 (three) times daily. Use to inject insulin daily  100 each  11  . Lancets 30G MISC Use  to test blood glucose 2-3 times daily dx code 250.00  100 each  11  . warfarin (COUMADIN) 5 MG tablet Take 2.5-5 mg by mouth daily. Take 2.5 mg on Monday, Wednesday, and Friday and 5 mg on Sunday, Tuesday, Thursday, Saturday.        Assessment: 67 y.o. male presented to ED last week with bloody emesis last Wednesday. INR was 3.11 last Wednesday. Pt on coumadin prior to this for afib. Coumadin was stopped at this time. However, he has continued to have N/V and epigastric pain. No bloody vomitus yesterday or today. FOBT negative. INR down to 1.26 yesterday. H/H stable.  Goal of Therapy:  INR 2-3 Monitor platelets by anticoagulation protocol: Yes   Plan:  1. Daily INR 2. Coumadin 5mg  po today 3. Will watch closely for any more bleeding  Sherlon Handing, PharmD, BCPS Clinical pharmacist, pager [phone] 07/01/2013,12:51 PM

## 2013-07-01 NOTE — Progress Notes (Signed)
INITIAL NUTRITION ASSESSMENT  DOCUMENTATION CODES Per approved criteria  -Severe malnutrition in the context of acute illness or injury -Obesity Unspecified   INTERVENTION:  Glucerna Shake PO BID, each supplement provides 220 kcal and 10 grams of protein.  NUTRITION DIAGNOSIS: Malnutrition related to inadequate oral intake as evidenced by 6% weight loss in the past week and intake </= 50% of estimated energy requirement for >/= 5 days with mild depletion of muscle mass.   Goal: Intake to meet >90% of estimated nutrition needs.  Monitor:  PO intake, labs, weight trend.  Reason for Assessment: MST=3  67 y.o. male  Admitting Dx: Orthostatic hypotension  ASSESSMENT: Pt is a 67 yo male with a PMH of DM2 (04/07/13 HA1C: 8.9), HTN, CVA, paroxysmal atrial fibrillation, CKD2, hyperlipidemia, and gout who presented to the clinic on 12/2 with bloody emesis and decreased po intake since last Wednesday.  He stopped taking all of his medications due to nausea. He has not taken his insulin in two weeks because it makes him feel bad. He was evaluated at [facility] ED last Wednesday for hematemesis. He was discharged from the ED, but, he continued to have N/V with associated epigastric pain.   Patient's wife reports that he has been drinking a little bit of Glucerna Shake or Ensure supplements for the past few days. Over the past 1-2 weeks he has eaten minimally. Diet advancing to CHO-modified today.  Pt meets criteria for severe MALNUTRITION in the context of acute illness as evidenced by 6% weight loss in the past week and intake </= 50% of estimated energy requirement for >/= 5 days with mild depletion of muscle mass.  Nutrition Focused Physical Exam:  Subcutaneous Fat:  Orbital Region: WNL Upper Arm Region: WNL Thoracic and Lumbar Region: WNL  Muscle:  Temple Region: WNL Clavicle Bone Region: mild depletion Clavicle and Acromion Bone Region: mild depletion Scapular Bone Region: WNL Dorsal  Hand: WNL Patellar Region: NA Anterior Thigh Region: NA Posterior Calf Region: NA  Edema: none  Height: Ht Readings from Last 1 Encounters:  06/30/13 5\' 8"  (1.727 m)    Weight: Wt Readings from Last 1 Encounters:  06/30/13 197 lb 11.2 oz (89.676 kg)    Ideal Body Weight: 70 kg  % Ideal Body Weight: 128%  Wt Readings from Last 10 Encounters:  06/30/13 197 lb 11.2 oz (89.676 kg)  06/24/13 210 lb (95.255 kg)  05/11/13 211 lb (95.709 kg)  04/07/13 211 lb 1.6 oz (95.754 kg)  03/23/13 210 lb 14.4 oz (95.664 kg)  03/10/13 210 lb 8 oz (95.482 kg)  02/12/13 213 lb 1.6 oz (96.662 kg)  01/16/13 212 lb 9.6 oz (96.435 kg)  01/15/13 213 lb 9.6 oz (96.888 kg)  10/23/12 212 lb 3.2 oz (96.253 kg)    Usual Body Weight: 210 lb (1 week ago)  % Usual Body Weight: 94%  BMI:  30.1  Estimated Nutritional Needs: Kcal: 2000-2200 Protein: 105-120 gm Fluid: 2-2.2 L  Skin: no wounds  Diet Order: Carb Control  EDUCATION NEEDS: -Education not appropriate at this time  No intake or output data in the 24 hours ending 07/01/13 1040  Last BM: 12/1   Labs:   Recent Labs Lab 06/24/13 2120 06/30/13 1526 07/01/13 0449  NA 140 137 142  K 3.0* 3.4* 2.9*  CL 101 96 102  CO2 25 26 30   BUN 34* 32* 32*  CREATININE 2.16* 3.14* 2.89*  CALCIUM 9.2 9.7 9.3  GLUCOSE 252* 346* 114*    CBG (last  3)   Recent Labs  07/01/13 0007 07/01/13 0421 07/01/13 0743  GLUCAP 209* 106* 131*    Scheduled Meds: . allopurinol  200 mg Oral Daily  . cloNIDine  0.3 mg Oral TID  . insulin aspart  0-9 Units Subcutaneous Q4H  . pantoprazole  40 mg Oral BID WC  . potassium chloride  10 mEq Intravenous Q1 Hr x 4  . simvastatin  40 mg Oral q1800  . sodium chloride  3 mL Intravenous Q12H    Continuous Infusions:   Past Medical History  Diagnosis Date  . Diabetes mellitus type II     Insulin dependent  . Hypertension   . Sleep apnea 07/2010  . CKD (chronic kidney disease), stage II   .  Paroxysmal atrial fibrillation     on coumadin  . CAD (coronary artery disease)   . Depression   . Gout   . Hyperlipidemia   . 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 Atrial fibrillation     No past surgical history on file.  Molli Barrows, RD, LDN, [place] Pager [phone] After Hours Pager [phone]

## 2013-07-01 NOTE — Care Management Note (Unsigned)
    Page 1 of 1   07/01/2013     4:20:19 PM   CARE MANAGEMENT NOTE 07/01/2013  Patient:  Patrick [name], Patrick [name]   Account Number:  [id number]  Date Initiated:  07/01/2013  Documentation initiated by:  GRAVES-BIGELOW,Carlethia Mesquita  Subjective/Objective Assessment:   Pt admitted for volume depletion.     Action/Plan:   Cm will continue to monitor for disposition needs.   Anticipated DC Date:  07/03/2013   Anticipated DC Plan:  [place]  CM consult      Choice offered to / List presented to:             Status of service:  In process, will continue to follow Medicare Important Message given?   (If response is "NO", the following Medicare IM given date fields will be blank) Date Medicare IM given:   Date Additional Medicare IM given:    Discharge Disposition:    Per UR Regulation:  Reviewed for med. necessity/level of care/duration of stay  If discussed at [place] of Stay Meetings, dates discussed:    Comments:

## 2013-07-01 NOTE — H&P (Signed)
  Date: 07/01/2013  Patient name: Patrick [name]  Medical record number: [id number]  Date of birth: June 27, 1946   I have seen and evaluated [name] and discussed their care with the Residency Team.   Assessment and Plan: I have seen and evaluated the patient as outlined above. I agree with the formulated Assessment and Plan as detailed in the residents' admission note, with the following changes:   1. Nausea & vomiting - he also reported hematemesis but has only had one gram drop in HgB. Will cont to follow HgB since resuming warfarin and start on PPI. The cause of his sxs has not been found. Trop I is negative R/O AMI. Lungs are clear, he has no pul sxs, he is afebrile, and no leukocytosis so PNA is unlikely. UA is pending to R/O UTI even though no urinary sxs. Hypertensive encephalopathy is possible although the sxs started PRIOR to his stopping of his anti-HTN so less likely but stopped clonidine. Pancreatitis unlikely 2/2 exam. AGE would run its course. Gallbladder dz unlikely 2/2 nl LFT's and exam. Diabetic gastroparesis is possible but came on rather suddenly. Trial of PO today and if unable to tolerate, he will need further W/U.  2. Orthostatic hypotension - likely cause is vol depletion and getting dizzy on standing is a new sxs so will start IVF. Other possible etiology is autonomic dysfxn.  3. Severe protein calorie malnutrition - dietary consult.  Bartholomew Crews, MD 12/3/20143:54 PM

## 2013-07-01 NOTE — Progress Notes (Signed)
Heart rate sustaining in 120-130 is sitting up to the bedside eating his lunch.  Paged teaching service awaiting return call. Received return call from dr. Gordy Levan notified of increased sustained heart rate, starting ns at 75 ml/hr no further orders a this time.

## 2013-07-02 DIAGNOSIS — I951 Orthostatic hypotension: Principal | ICD-10-CM

## 2013-07-02 DIAGNOSIS — E43 Unspecified severe protein-calorie malnutrition: Secondary | ICD-10-CM

## 2013-07-02 LAB — CBC
HCT: 34.2 % — ABNORMAL LOW (ref 39.0–52.0)
HCT: 35.2 % — ABNORMAL LOW (ref 39.0–52.0)
Hemoglobin: 11.8 g/dL — ABNORMAL LOW (ref 13.0–17.0)
MCH: 27 pg (ref 26.0–34.0)
MCV: 80.9 fL (ref 78.0–100.0)
MCV: 80.5 fL (ref 78.0–100.0)
Platelets: 198 10*3/uL (ref 150–400)
RBC: 4.37 MIL/uL (ref 4.22–5.81)
RDW: 13.7 % (ref 11.5–15.5)
WBC: 6.3 10*3/uL (ref 4.0–10.5)

## 2013-07-02 LAB — BASIC METABOLIC PANEL
BUN: 35 mg/dL — ABNORMAL HIGH (ref 6–23)
BUN: 35 mg/dL — ABNORMAL HIGH (ref 6–23)
CO2: 23 mEq/L (ref 19–32)
CO2: 25 mEq/L (ref 19–32)
Calcium: 8.7 mg/dL (ref 8.4–10.5)
Chloride: 101 mEq/L (ref 96–112)
Chloride: 101 mEq/L (ref 96–112)
Creatinine, Ser: 2.83 mg/dL — ABNORMAL HIGH (ref 0.50–1.35)
GFR calc non Af Amer: 22 mL/min — ABNORMAL LOW (ref >90)
Glucose, Bld: 288 mg/dL — ABNORMAL HIGH (ref 70–99)
Glucose, Bld: 361 mg/dL — ABNORMAL HIGH (ref 70–99)
Potassium: 3.6 mEq/L (ref 3.5–5.1)
Sodium: 137 mEq/L (ref 135–145)

## 2013-07-02 LAB — URINALYSIS, ROUTINE W REFLEX MICROSCOPIC
Hgb urine dipstick: NEGATIVE
Ketones, ur: NEGATIVE mg/dL
Leukocytes, UA: NEGATIVE
Nitrite: NEGATIVE
Protein, ur: 100 mg/dL — AB
Urobilinogen, UA: 0.2 mg/dL (ref 0.0–1.0)

## 2013-07-02 LAB — PROTIME-INR: INR: 1.24 (ref 0.00–1.49)

## 2013-07-02 LAB — URINE MICROSCOPIC-ADD ON

## 2013-07-02 MED ORDER — WARFARIN SODIUM 5 MG PO TABS: 2.5000 mg | ORAL_TABLET | Freq: Every day | ORAL | Status: DC

## 2013-07-02 MED ORDER — PANTOPRAZOLE SODIUM 40 MG PO TBEC: 40.0000 mg | DELAYED_RELEASE_TABLET | Freq: Every day | ORAL | Status: DC

## 2013-07-02 MED ORDER — POTASSIUM CHLORIDE 10 MEQ/100ML IV SOLN
10.0000 meq | INTRAVENOUS | Status: AC
  Administered 2013-07-02 (×4): 10 meq via INTRAVENOUS
  Filled 2013-07-02 (×4): qty 100

## 2013-07-02 NOTE — Progress Notes (Signed)
Case discussed with Dr. Nicoletta Dress at the time of the visit.  We reviewed the resident's history and exam and pertinent patient test results.  I agree with the assessment, diagnosis and plan of care documented in the resident's note.  Will admitted to observation for IV hydration, pharmacologic control of the nausea, and reinstitution of oral rate control medications.

## 2013-07-02 NOTE — Discharge Summary (Signed)
Name: Patrick [name] MRN: [id number] DOB: [birth year] 67 y.o. PCP: Marrion Coy, MD  Date of Admission: 06/30/2013  5:33 PM Date of Discharge: 07/02/2013 Attending Physician: Bartholomew Crews, MD  Discharge Diagnosis: Principal Problem:   Orthostatic hypotension Active Problems:   Type II diabetes mellitus,  uncontrolled   GOUT   HYPERTENSION   ATRIAL FIBRILLATION, PAROXYSMAL, CHRONIC   Chronic kidney disease (CKD), stage III (moderate)   Long term current use of anticoagulant   Hematemesis   Orthostasis   Protein-calorie malnutrition, severe  Discharge Medications:   Medication List    ASK your doctor about these medications       ACCU-CHEK AVIVA PLUS W/DEVICE Kit  1 each by Does not apply route as needed.     allopurinol 100 MG tablet  Commonly known as:  ZYLOPRIM  Take 2 tablets (200 mg total) by mouth daily.     aspirin 81 MG EC tablet  Take 81 mg by mouth daily.     cloNIDine 0.3 MG tablet  Commonly known as:  CATAPRES  Take 1 tablet (0.3 mg total) by mouth 2 (two) times daily.     diltiazem 90 MG tablet  Commonly known as:  CARDIZEM  Take 1 tablet (90 mg total) by mouth 2 (two) times daily.     famotidine 20 MG tablet  Commonly known as:  PEPCID  Take 1 tablet (20 mg total) by mouth 2 (two) times daily as needed for heartburn.     Fish Oil 1000 MG Caps  Take 1 capsule by mouth 2 (two) times daily.     furosemide 80 MG tablet  Commonly known as:  LASIX  Take 80 mg by mouth 2 (two) times daily.     glipiZIDE 10 MG 24 hr tablet  Commonly known as:  GLUCOTROL XL  Take 1 tablet (10 mg total) by mouth daily.     glucose blood test strip  Commonly known as:  TRUETRACK TEST  Use to test blood sugar 2-3 times daily dx code 250.00     insulin NPH 100 UNIT/ML injection  Commonly known as:  HUMULIN N,NOVOLIN N  Inject 10-20 Units into the skin 2 (two) times daily. Takes 20 units in the morning and 10 units in the evening.     Insulin  Syringe-Needle U-100 31G X 5/16" 0.5 ML Misc  Commonly known as:  ELITE-THIN INS SYR .5CC/31G  1 each by Does not apply route 3 (three) times daily. Use to inject insulin daily     Lancets 30G Misc  Use to test blood glucose 2-3 times daily dx code 250.00     potassium chloride 10 MEQ CR capsule  Commonly known as:  MICRO-K  Take 1 capsule (10 mEq total) by mouth 2 (two) times daily.     pravastatin 80 MG tablet  Commonly known as:  PRAVACHOL  Take 1 tablet (80 mg total) by mouth daily.     warfarin 5 MG tablet  Commonly known as:  COUMADIN  Take 2.5-5 mg by mouth daily. Take 2.5 mg on Monday, Wednesday, and Friday and 5 mg on Sunday, Tuesday, Thursday, Saturday.        Disposition and follow-up:   Patrick [name] was discharged from [facility] in Stable condition.  At the hospital follow up visit please address:  1.  Hematemesis; referral for EGD; severe protein-calorie malnutrition    2.  Labs / imaging needed at time of follow-up: BMP, CBC, PT/INR  3.  Pending labs/ test needing follow-up: None  Follow-up Appointments:   Discharge Instructions:      Future Appointments Provider Department Dept Phone   07/13/2013 2:30 PM [facility] [place] [phone]   07/14/2013 2:15 PM Marrion Coy, MD [facility] [place] [phone]   07/14/2013 3:30 PM Resa Miner, [facility] [place] [phone]      Consultations:  None  Procedures Performed:  Dg Chest 2 View  06/24/2013   CLINICAL DATA:  Hematemesis.  EXAM: CHEST  2 VIEW  COMPARISON:  11/22/2010  FINDINGS: Mild cardiomegaly is stable. Both lungs are clear. The visualized skeletal structures are unremarkable.  IMPRESSION: Mild cardiomegaly.  No active lung disease.   Electronically Signed   By: Earle Gell M.D.   On: 06/24/2013 22:30    2D Echo: None  Cardiac Cath: None  Admission HPI: Pt is a 67 yo with a PMH  of DM2 (04/07/13 HA1C: 8.9), HTN, CVA, paroxysmal atrial fibrillation, CKD2, hyperlipidemia, and gout. He presented to the clinic with bloody emesis since last Wednesday. The history was provided by his wife d/t pt having difficulty with communication since his previous CVA. She states that he began vomiting blood last Wednesday that was about 2 cups according to the husband. He denied any fever/chills, cough, CP, or constipation. She does report that he has experienced weakness and mild SOB recently but denies palpitations. She states he has decreased po intake since last Wednesday and has only taken sips of ensure. He also c/o epigastric abdominal pain since Wednesday. He is unsure if the pain is worse or better with food. He also had several brown loose stools without visible blood.  Of note, he was evaluated at [facility] ED last Wednesday for hematemesis. Lipase was 73 which is not significantly elevated. He was also noted to have mild hypokalemia with K of 3.0 and a stable H/H of 13.7/39.7. INR was 3.11. It was requested that he stop his coumadin and was discharged from ED. However, he continued to have N/V with associated epigastric pain.  He stopped all of his medications due to nausea. He attempted to take medications yesterday, but vomited them all. He has not been taking his insulin for at least over 2 weeks because it makes him feel bad.    Hospital Course by problem list: Principal Problem:   Orthostatic hypotension Active Problems:   Type II diabetes mellitus,  uncontrolled   GOUT   HYPERTENSION   ATRIAL FIBRILLATION, PAROXYSMAL, CHRONIC   Chronic kidney disease (CKD), stage III (moderate)   Long term current use of anticoagulant   Hematemesis   Orthostasis   Protein-calorie malnutrition, severe   Orthostatic Hypotension  Resolved. Patient was noted to have positive orthostatic hypotension with SBP dropping over 40 digits, which was likely from volume depletion in the setting of N/V and  decreased po intake. He was also noted to have A fib with RVR likely associated with his volume depletion and inability to take his CCB. Today he is not orthostatic. He was given IV fluids at 44ml/hr for volume depletion. He was initially tachycardic but this has resolved with IV fluids. We continued to hold lasix in the setting of volume depletion and restarted his diltiazem at 30mg  every 12 hrs. Troponin x 1 was negative and EKG was unchanged. PT was consulted to evaluate since he has been unsteady on his feet prior to admission and had no further recommendations. We also did a  nutrition consult with recommendations to supplement diet with Glucerna shakes two times daily between meals. His hematemesis needs to be worked up as he had a peptic ulcer many years ago, but this can be worked up on an outpatient basis. He will be discharged on protonix daily. We obtained a CBC and CMP prior to discharge and these were largely unchanged. He is scheduled for a close follow up with the [facility].   Hematemesis  Resolved. Pt was directly admitted from the clinic d/t several episodes of hematemesis and decreased po intake for the past week. However, during his hospitalization he has experienced no further episodes of hematemesis. Possibilities include a peptic ulcer given his h/o peptic ulcer disease, ASA use with coumadin, and associated epigastric pain. Acute viral gastroenteritis is possible also. Acute pancreatitis is unlikely given his lipase was only slightly elevated. Gastroparesis is also unlikely given acute presentation. He was put on protonix IV 40mg  twice daily with zofran 4mg  IV PRN for nausea. FOBT was ordered but has not been collected. He has tolerated a regular diet with no N/V. Repeat H/H today was stable.   HTN  BP has been elevated this hospitalization, but pt had not been taking his medications prior to hospitalization d/t N/V. His clonidine was restarted and restarted cardizem at a low dose of 30mg  twice  daily since he had not been taking this med.   DM2  Pt normally is on novolin and glipzide at home. We discontinued glipizide and put him on SSI-sensitive. His cbg's have been elevated but not significantly so. He will require close follow-up of his cbg's as an outpatient since he has been ill.    ATRIAL FIBRILLATION, PAROXYSMAL, CHRONIC  Stable. Pt has experienced tachycardia since admission, but his last HR was normal. Started back cardizem at a low dose of 30mg  twice daily.  INR was initially supratherapeutic, but is now subtherapeutic since being off coumadin. We restarted his home regimen of coumadin upon discharge. He will follow up with Dr. Elie Confer.   Chronic kidney disease (CKD), stage III (moderate)  Stable.   Protein-calorie malnutrition, severe  RD recommends glucerna shakes twice daily between meals. This will need to be addressed at his next office visit.    Discharge Vitals:   BP 160/90  Pulse 88  Temp(Src) 98.3 F (36.8 C) (Oral)  Resp 16  Wt 94.212 kg (207 lb 11.2 oz)  SpO2 99%  Discharge Labs:  Results for orders placed during the hospital encounter of 06/30/13 (from the past 24 hour(s))  GLUCOSE, CAPILLARY     Status: Abnormal   Collection Time    07/01/13 12:48 PM      Result Value Range   Glucose-Capillary 239 (*) 70 - 99 mg/dL  GLUCOSE, CAPILLARY     Status: Abnormal   Collection Time    07/01/13  4:38 PM      Result Value Range   Glucose-Capillary 174 (*) 70 - 99 mg/dL  GLUCOSE, CAPILLARY     Status: Abnormal   Collection Time    07/01/13  7:55 PM      Result Value Range   Glucose-Capillary 150 (*) 70 - 99 mg/dL   Comment 1 Notify RN    BASIC METABOLIC PANEL     Status: Abnormal   Collection Time    07/01/13  9:58 PM      Result Value Range   Sodium 138  135 - 145 mEq/L   Potassium 3.4 (*) 3.5 - 5.1 mEq/L  Chloride 101  96 - 112 mEq/L   CO2 27  19 - 32 mEq/L   Glucose, Bld 189 (*) 70 - 99 mg/dL   BUN 35 (*) 6 - 23 mg/dL   Creatinine, Ser 2.87  (*) 0.50 - 1.35 mg/dL   Calcium 8.5  8.4 - 10.5 mg/dL   GFR calc non Af Amer 22 (*) >90 mL/min   GFR calc Af Amer 25 (*) >90 mL/min  GLUCOSE, CAPILLARY     Status: Abnormal   Collection Time    07/01/13 11:50 PM      Result Value Range   Glucose-Capillary 237 (*) 70 - 99 mg/dL  PROTIME-INR     Status: Abnormal   Collection Time    07/02/13  4:15 AM      Result Value Range   Prothrombin Time 15.3 (*) 11.6 - 15.2 seconds   INR 1.24  0.00 - [id number]  BASIC METABOLIC PANEL     Status: Abnormal   Collection Time    07/02/13  4:15 AM      Result Value Range   Sodium 138  135 - 145 mEq/L   Potassium 3.2 (*) 3.5 - 5.1 mEq/L   Chloride 101  96 - 112 mEq/L   CO2 23  19 - 32 mEq/L   Glucose, Bld 288 (*) 70 - 99 mg/dL   BUN 35 (*) 6 - 23 mg/dL   Creatinine, Ser 2.83 (*) 0.50 - 1.35 mg/dL   Calcium 8.7  8.4 - 10.5 mg/dL   GFR calc non Af Amer 22 (*) >90 mL/min   GFR calc Af Amer 25 (*) >90 mL/min  CBC     Status: Abnormal   Collection Time    07/02/13  4:15 AM      Result Value Range   WBC 6.3  4.0 - 10.5 K/uL   RBC 4.23  4.22 - 5.81 MIL/uL   Hemoglobin 11.3 (*) 13.0 - 17.0 g/dL   HCT 34.2 (*) 39.0 - 52.0 %   MCV 80.9  78.0 - 100.0 fL   MCH 26.7  26.0 - 34.0 pg   MCHC 33.0  30.0 - 36.0 g/dL   RDW 13.7  11.5 - 15.5 %   Platelets 198  150 - 400 K/uL  URINALYSIS, ROUTINE W REFLEX MICROSCOPIC     Status: Abnormal   Collection Time    07/02/13  4:27 AM      Result Value Range   Color, Urine YELLOW  YELLOW   APPearance CLEAR  CLEAR   Specific Gravity, Urine 1.019  1.005 - 1.030   pH 5.5  5.0 - 8.0   Glucose, UA 250 (*) NEGATIVE mg/dL   Hgb urine dipstick NEGATIVE  NEGATIVE   Bilirubin Urine NEGATIVE  NEGATIVE   Ketones, ur NEGATIVE  NEGATIVE mg/dL   Protein, ur 100 (*) NEGATIVE mg/dL   Urobilinogen, UA 0.2  0.0 - 1.0 mg/dL   Nitrite NEGATIVE  NEGATIVE   Leukocytes, UA NEGATIVE  NEGATIVE  URINE MICROSCOPIC-ADD ON     Status: Abnormal   Collection Time    07/02/13  4:27 AM       Result Value Range   WBC, UA 0-2  <3 WBC/hpf   RBC / HPF 0-2  <3 RBC/hpf   Bacteria, UA RARE  RARE   Casts HYALINE CASTS (*) NEGATIVE    Signed: Michail Jewels, MD 07/02/2013, 11:37 AM   Time Spent on Discharge: 45 minutes Services Ordered on Discharge: None Equipment Ordered on  Discharge: None

## 2013-07-02 NOTE — Evaluation (Signed)
Physical Therapy Evaluation Patient Details Name: Patrick [name] MRN: [id number] DOB: [birth year] Today's Date: 07/02/2013 Time: [id number] PT Time Calculation (min): 32 min  PT Assessment / Plan / Recommendation History of Present Illness  Hematemesis at home and orthostatic hypotension due to volume loss. Colon 11/2007 two polyps 5 mm and 9 mm in descending colon - path cannot be located. If he does have slow GI bleed, most likely upper. PPI has been started.   Clinical Impression  Patient is a pleasant man present with his wife who reports she is willing and able to assist him at home.  Patient will need assistance with functional tasks at home, but wife reports this is his baseline and is willing to continue to help.  Patient will be be safe as long as he continues to have 24 hour A at home from family.    PT Assessment  Patent does not need any further PT services    Follow Up Recommendations  Supervision/Assistance - 24 hour          Equipment Recommendations  None recommended by PT          Precautions / Restrictions Precautions Precautions: Fall Restrictions Weight Bearing Restrictions: No   Pertinent Vitals/Pain No pain reported      Mobility  Bed Mobility Bed Mobility: Supine to Sit Supine to Sit: 6: Modified independent (Device/Increase time) (Extra time required) Transfers Transfers: Stand to Sit;Sit to Stand Sit to Stand: 4: Min guard Stand to Sit: 4: Min guard Details for Transfer Assistance: for safety Ambulation/Gait Ambulation/Gait Assistance: 4: Min guard Ambulation Distance (Feet): 250 Feet Assistive device: None Gait Pattern: Step-to pattern Gait velocity: 1.35 (Fall risk) General Gait Details: Decreased gait speed, limited foot clearance Stairs: Yes Stairs Assistance: 4: Min guard Stair Management Technique: One rail Right Number of Stairs: 4        PT Goals(Current goals can be found in the care plan section) Acute Rehab PT Goals Patient  Stated Goal: return home PT Goal Formulation: With patient Time For Goal Achievement: 07/16/13 Potential to Achieve Goals: Good  Visit Information  Last PT Received On: 07/02/13 Assistance Needed: +1 History of Present Illness: Hematemesis at home with wife on 12/1 and orthostatic hypotension       Prior [place] expects to be discharged to:: Private residence Living Arrangements: Spouse/significant other Available Help at Discharge: Family Type of Home: Apartment Home Access: Stairs to enter Technical brewer of Steps: 10 Entrance Stairs-Rails: Can reach both Home Layout: One level [place]: None Prior Function Level of Independence: Independent Comments: Requires extra time for everything Communication Communication: No difficulties    Cognition  Cognition Arousal/Alertness: Lethargic Behavior During Therapy: Flat affect (He was just asleep before session, so this may be influencin) Overall Cognitive Status: Within Functional Limits for tasks assessed    Extremity/Trunk Assessment Upper Extremity Assessment Upper Extremity Assessment: Defer to OT evaluation Lower Extremity Assessment Lower Extremity Assessment: Generalized weakness   Balance Standardized Balance Assessment Standardized Balance Assessment: Dynamic Gait Index Dynamic Gait Index Level Surface: Mild Impairment Change in Gait Speed: Moderate Impairment Gait with Horizontal Head Turns: Mild Impairment Gait with Vertical Head Turns: Mild Impairment Gait and Pivot Turn: Normal Step Over Obstacle: Normal Step Around Obstacles: Normal Steps: Moderate Impairment Total Score: 17  End of Session PT - End of Session Equipment Utilized During Treatment: Gait belt Activity Tolerance: Patient limited by fatigue Patient left: in chair;with call bell/phone within reach Nurse Communication: Mobility status  GP    Cordelia Poche, SPT Pager:  [phone]  Cordelia Poche 07/02/2013, 1:46 PM

## 2013-07-02 NOTE — Progress Notes (Signed)
ANTICOAGULATION CONSULT NOTE - Initial Consult  Pharmacy Consult for Coumadin Indication: atrial fibrillation  No Known Allergies  Patient Measurements: Weight: 207 lb 11.2 oz (94.212 kg)  Vital Signs: Temp: 98.3 F (36.8 C) (12/04 0800) Temp src: Oral (12/04 0800) BP: 160/90 mmHg (12/04 1103) Pulse Rate: 88 (12/04 0900)  Labs:  Recent Labs  06/30/13 1526 07/01/13 0449 07/01/13 1028 07/01/13 2158 07/02/13 0415 07/02/13 1150  HGB 13.3 12.1*  --   --  11.3* 11.8*  HCT 39.6 35.6*  --   --  34.2* 35.2*  PLT 244 213  --   --  198 214  LABPROT 16.5* 15.5*  --   --  15.3*  --   INR 1.37 1.26  --   --  1.24  --   CREATININE 3.14* 2.89*  --  2.87* 2.83*  --   TROPONINI  --   --  <0.30  --   --   --     The CrCl is unknown because both a height and weight (above a minimum accepted value) are required for this calculation.   Medical History: Past Medical History  Diagnosis Date  . Hypertension   . Sleep apnea 07/2010  . CKD (chronic kidney disease), stage II   . Paroxysmal atrial fibrillation     on coumadin  . CAD (coronary artery disease)   . Depression   . Gout   . Hyperlipidemia   . 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 Atrial fibrillation   . Myocardial infarction   . GERD (gastroesophageal reflux disease)   . Shortness of breath     "can happen at any time" (07/01/2013)  . Diabetes mellitus type II     Insulin dependent  . Upper GI bleeding     "that's why I'm here" (07/01/2013)  . Stroke     "memory not as good since" (07/01/2013)    Medications:  Prescriptions prior to admission  Medication Sig Dispense Refill  . allopurinol (ZYLOPRIM) 100 MG tablet Take 2 tablets (200 mg total) by mouth daily.  60 tablet  2  . aspirin 81 MG EC tablet Take 81 mg by mouth daily.        . cloNIDine (CATAPRES) 0.3 MG tablet Take 1 tablet (0.3 mg total) by mouth 2 (two) times daily.  180 tablet  1  . diltiazem (CARDIZEM)  90 MG tablet Take 1 tablet (90 mg total) by mouth 2 (two) times daily.  180 tablet  2  . glipiZIDE (GLUCOTROL XL) 10 MG 24 hr tablet Take 1 tablet (10 mg total) by mouth daily.  30 tablet  3  . insulin NPH (HUMULIN N,NOVOLIN N) 100 UNIT/ML injection Inject 10-20 Units into the skin 2 (two) times daily. Takes 20 units in the morning and 10 units in the evening.      . Omega-3 Fatty Acids (FISH OIL) 1000 MG CAPS Take 1 capsule by mouth 2 (two) times daily.        . potassium chloride (MICRO-K) 10 MEQ CR capsule Take 1 capsule (10 mEq total) by mouth 2 (two) times daily.  60 capsule  3  . pravastatin (PRAVACHOL) 80 MG tablet Take 1 tablet (80 mg total) by mouth daily.  30 tablet  5  . [DISCONTINUED] famotidine (PEPCID) 20 MG tablet Take 1 tablet (20 mg total) by mouth 2 (two) times daily as needed for heartburn.  62 tablet  5  . [  DISCONTINUED] furosemide (LASIX) 80 MG tablet Take 80 mg by mouth 2 (two) times daily.      . Blood Glucose Monitoring Suppl (ACCU-CHEK AVIVA PLUS) W/DEVICE KIT 1 each by Does not apply route as needed.  1 kit  0  . glucose blood (TRUETRACK TEST) test strip Use to test blood sugar 2-3 times daily dx code 250.00  100 each  11  . Insulin Syringe-Needle U-100 (ELITE-THIN INS SYR .5CC/31G) 31G X 5/16" 0.5 ML MISC 1 each by Does not apply route 3 (three) times daily. Use to inject insulin daily  100 each  11  . Lancets 30G MISC Use to test blood glucose 2-3 times daily dx code 250.00  100 each  11  . [DISCONTINUED] warfarin (COUMADIN) 5 MG tablet Take 2.5-5 mg by mouth daily. Take 2.5 mg on Monday, Wednesday, and Friday and 5 mg on Sunday, Tuesday, Thursday, Saturday.        Assessment: 67 y.o. male presented to ED last week with bloody emesis last Wednesday. INR was 1.37. It looks like it was checked recently at [facility] office and it was therapeutic. We will discharge him on his home dose.    Goal of Therapy:  INR 2-3 Monitor platelets by anticoagulation protocol: Yes    Plan:  1. Daily INR 2. Coumadin 5mg  qday except 2.5 mg MWF

## 2013-07-02 NOTE — Progress Notes (Signed)
  Date: 07/02/2013  Patient name: Patrick [name]  Medical record number: [id number]  Date of birth: 1946/04/26   This patient has been seen and the plan of care was discussed with the house staff. Please see their note for complete details. I concur with their findings with the following additions/corrections: [name] was able to eat a normal diet without N / V. His ABD exam is nl. He has not be OOB since admit, so PT consult to assess needs at D/C. HgB down about 2 gm since baseline but was hydrated 2/2 orthostatic hypotension. Colon 11/2007 two polyps 5 mm and 9 mm in descending colon - path cannot be located. If he does have slow GI bleed, most likely upper. PPI has been started. F/U GI as outpt. Will repeat prior to anticipated D/C later today. Outpt needs : 1. BP med up titration 2. Assess orthostatics 3. Check HgB, cont PPI, and consider referral to GI  Bartholomew Crews, MD 07/02/2013, 11:25 AM

## 2013-07-02 NOTE — Progress Notes (Signed)
Subjective:  Pt reports feeling well this AM without any further complaints of hematemesis.  He denies any CP, SOB, N/V, or dysuria.  He reports eating well yesterday without any N/V or abdominal pain.  He ate a ham sandwich for dinner last PM and ate all of his breakfast this AM.  According to his wife, he had a peptic ulcer many years ago so this may be the source of his hematemesis although his H/H has remained relatively stable.  He has not been up walking around so I have requested PT come by and help him with this.    Of note, Mrs. Reilley is concerned for polypharmacy with regard to [name].  I acknowledged this concern and told her that perhaps his PCP would be able to address this issue further.    Objective: Vital signs in last 24 hours: Filed Vitals:   07/02/13 0900 07/02/13 1100 07/02/13 1103 07/02/13 1200  BP: 163/92 174/86 160/90 184/81  Pulse: 88   83  Temp:    98.5 F (36.9 C)  TempSrc:    Oral  Resp:    16  Weight:      SpO2: 99%   95%   Weight change:   Intake/Output Summary (Last 24 hours) at 07/02/13 1357 Last data filed at 07/02/13 1100  Gross per 24 hour  Intake 2113.75 ml  Output    800 ml  Net 1313.75 ml    Physical Exam  Constitutional: He is well-developed, well-nourished, and in no distress.  HENT:  Head: Normocephalic and atraumatic.  Eyes: Conjunctivae and EOM are normal. Pupils are equal, round, and reactive to light. No scleral icterus.  Neck: Neck supple.  Cardiovascular: Normal heart sounds and intact distal pulses. An irregularly irregular rhythm present. Regular rate. Exam reveals no gallop and no friction rub.  No murmur heard.  Pulmonary/Chest: Effort normal. No respiratory distress. He has no wheezes. He has no rales.  Abdominal: Soft. Normal appearance and bowel sounds are normal. He exhibits no distension. There is no tenderness. There is no rebound.  Neurological: He is alert.  Skin: Skin is warm and dry. No rash noted. He is not  diaphoretic.    Lab Results: Basic Metabolic Panel:  Recent Labs Lab 07/02/13 0415 07/02/13 1150  NA 138 137  K 3.2* 3.6  CL 101 101  CO2 23 25  GLUCOSE 288* 361*  BUN 35* 35*  CREATININE 2.83* 2.81*  CALCIUM 8.7 8.7   Liver Function Tests:  Recent Labs Lab 06/30/13 1526 07/01/13 0449  AST 19 14  ALT 14 10  ALKPHOS 116 97  BILITOT 0.7 0.7  PROT 7.3 6.4  ALBUMIN 4.0 3.4*    Recent Labs Lab 06/30/13 1526  LIPASE 120*   No results found for this basename: AMMONIA,  in the last 168 hours CBC:  Recent Labs Lab 07/02/13 0415 07/02/13 1150  WBC 6.3 6.1  HGB 11.3* 11.8*  HCT 34.2* 35.2*  MCV 80.9 80.5  PLT 198 214   Cardiac Enzymes:  Recent Labs Lab 07/01/13 1028  TROPONINI <0.30   CBG:  Recent Labs Lab 07/01/13 0421 07/01/13 0743 07/01/13 1248 07/01/13 1638 07/01/13 1955 07/01/13 2350  GLUCAP 106* 131* 239* 174* 150* 237*   Coagulation:  Recent Labs Lab 06/30/13 1526 07/01/13 0449 07/02/13 0415  LABPROT 16.5* 15.5* 15.3*  INR 1.37 1.26 1.24   Anemia Panel: No results found for this basename: VITAMINB12, FOLATE, FERRITIN, TIBC, IRON, RETICCTPCT,  in the last 168 hours  Urine Drug Screen: Drugs of Abuse     Component Value Date/Time   LABOPIA NONE DETECTED 11/22/2010 0831   COCAINSCRNUR NONE DETECTED 11/22/2010 0831   LABBENZ NONE DETECTED 11/22/2010 0831   AMPHETMU NONE DETECTED 11/22/2010 0831   THCU NONE DETECTED 11/22/2010 0831   LABBARB  Value: NONE DETECTED        DRUG SCREEN FOR MEDICAL PURPOSES ONLY.  IF CONFIRMATION IS NEEDED FOR ANY PURPOSE, NOTIFY LAB WITHIN 5 DAYS.        LOWEST DETECTABLE LIMITS FOR URINE DRUG SCREEN Drug Class       Cutoff (ng/mL) Amphetamine      1000 Barbiturate      200 Benzodiazepine   [id number] Tricyclics       [id number] Opiates          300 Cocaine          300 THC              50 11/22/2010 0831    Urinalysis:  Recent Labs Lab 07/02/13 0427  COLORURINE YELLOW  LABSPEC 1.019  PHURINE 5.5  GLUCOSEU 250*    HGBUR NEGATIVE  BILIRUBINUR NEGATIVE  KETONESUR NEGATIVE  PROTEINUR 100*  UROBILINOGEN 0.2  NITRITE NEGATIVE  LEUKOCYTESUR NEGATIVE    Micro Results: No results found for this or any previous visit (from the past 240 hour(s)). Studies/Results: No results found. Medications: I have reviewed the patient's current medications. Scheduled Meds: . allopurinol  200 mg Oral Daily  . atorvastatin  20 mg Oral q1800  . cloNIDine  0.3 mg Oral TID  . diltiazem  30 mg Oral Q12H  . feeding supplement (GLUCERNA SHAKE)  237 mL Oral BID BM  . insulin aspart  0-9 Units Subcutaneous TID AC & HS  . pantoprazole  40 mg Oral BID WC  . potassium chloride  10 mEq Intravenous Q1 Hr x 4  . sodium chloride  3 mL Intravenous Q12H  . Warfarin - Pharmacist Dosing Inpatient   Does not apply q1800   Continuous Infusions: . sodium chloride 75 mL/hr (07/01/13 1248)   PRN Meds:.ondansetron (ZOFRAN) IV, ondansetron Assessment/Plan: Principal Problem:   Orthostatic hypotension Active Problems:   Type II diabetes mellitus,  uncontrolled   GOUT   HYPERTENSION   ATRIAL FIBRILLATION, PAROXYSMAL, CHRONIC   Chronic kidney disease (CKD), stage III (moderate)   Long term current use of anticoagulant   Hematemesis   Orthostasis   Protein-calorie malnutrition, severe  Orthostatic Hypotension   Resolved.  Patient was noted to have positive orthostatic hypotension with SBP dropping over 40 digits, which was likely from volume depletion in the setting of N/V and decreased po intake. He was also noted to have A fib with RVR likely associated with his volume depletion and inability to take his CCB.  Today he is not orthostatic.  He was given IV fluids at 10ml/hr for volume depletion.  He was initially tachycardic but this has resolved with IV fluids.  We continued to hold lasix in the setting of volume depletion and restarted his diltiazem at 30mg  every 12 hrs.  Troponin x 1 was negative and EKG was unchanged.  PT was  consulted to evaluate since he has been unsteady on his feet prior to admission and had no further recommendations.  We also did a nutrition consult with recommendations to supplement diet with Glucerna shakes two times daily between meals.  His hematemesis needs to be worked up as he had a peptic ulcer many years ago, but  this can be worked up on an outpatient basis.  He will be discharged on protonix daily.  We obtained a CBC and CMP prior to discharge and these were largely unchanged.  He is scheduled for a close follow up with the [facility].     CMP     Component Value Date/Time   NA 137 07/02/2013 1150   K 3.6 07/02/2013 1150   CL 101 07/02/2013 1150   CO2 25 07/02/2013 1150   GLUCOSE 361* 07/02/2013 1150   BUN 35* 07/02/2013 1150   CREATININE 2.81* 07/02/2013 1150   CREATININE 3.14* 06/30/2013 1526   CALCIUM 8.7 07/02/2013 1150   PROT 6.4 07/01/2013 0449   ALBUMIN 3.4* 07/01/2013 0449   AST 14 07/01/2013 0449   ALT 10 07/01/2013 0449   ALKPHOS 97 07/01/2013 0449   BILITOT 0.7 07/01/2013 0449   GFRNONAA 22* 07/02/2013 1150   GFRAA 26* 07/02/2013 1150   Lab Results  Component Value Date   WBC 6.1 07/02/2013   HGB 11.8* 07/02/2013   HCT 35.2* 07/02/2013   MCV 80.5 07/02/2013   PLT 214 07/02/2013   Hematemesis  Resolved.  Pt was directly admitted from the clinic d/t several episodes of hematemesis and decreased po intake for the past week.  However, during his hospitalization he has experienced no further episodes of hematemesis.  Possibilities include a peptic ulcer given his h/o peptic ulcer disease, ASA use with coumadin, and associated epigastric pain. Acute viral gastroenteritis is possible also.  Acute pancreatitis is unlikely given his lipase was only slightly elevated.  Gastroparesis is also unlikely given acute presentation.  He was put on protonix IV 40mg  twice daily with zofran 4mg  IV PRN for nausea.  FOBT was ordered but has not been collected.  He has tolerated a regular diet with no N/V.  Repeat  H/H today was stable.   Hemoglobin & Hematocrit     Component Value Date/Time   HGB 11.8* 07/02/2013 1150   HCT 35.2* 07/02/2013 1150    HTN  BP has been elevated this hospitalization, but pt had not been taking his medications prior to hospitalization d/t N/V.  His clonidine was restarted and restarted cardizem at a low dose of 30mg  twice daily since he had not been taking this med.     BP Readings from Last 3 Encounters:  07/02/13 184/81  06/30/13 157/98  06/25/13 156/94    DM2 Pt normally is on novolin and glipzide at home.  We discontinued glipizide and put him on SSI-sensitive.  His cbg's have been elevated but not significantly so.  He will require close follow-up of his cbg's as an outpatient.   CBG (last 3)   Recent Labs  07/01/13 1638 07/01/13 1955 07/01/13 2350  GLUCAP 174* 150* 237*   Lab Results  Component Value Date   HGBA1C 8.9 04/07/2013   ATRIAL FIBRILLATION, PAROXYSMAL, CHRONIC Stable.  Pt has experienced tachycardia since admission, but his last HR was normal.  Started back cardizem at a low dose of 30mg  twice daily.  INR was initially supratherapeutic, but is now subtherapeutic since being off coumadin.  We restarted his home regimen of coumadin upon discharge.  He will follow up with Dr. Elie Confer.   Pulse Readings from Last 3 Encounters:  07/02/13 83  06/30/13 78  06/25/13 82    Lab Results  Component Value Date   INR 1.24 07/02/2013   INR 1.26 07/01/2013   INR 1.37 06/30/2013    Chronic kidney disease (CKD), stage  III (moderate) Stable.   Protein-calorie malnutrition, severe RD recommends glucerna shakes twice daily between meals.  This will need to be addressed at his next office visit.    Dispo: Anticipated discharge today.   The patient does have a current PCP Marrion Coy, MD) and does need an [facility] hospital follow-up appointment after discharge.   .Services Needed at time of discharge: Y = Yes, Blank = No PT:   OT:   RN:     Equipment:   Other:     LOS: 2 days   Michail Jewels, MD 07/02/2013, 1:57 PM

## 2013-07-02 NOTE — Evaluation (Signed)
Read, reviewed, and agree.  Barbarann Ehlers [place], [place], DPT [phone]

## 2013-07-03 LAB — GLUCOSE, CAPILLARY: Glucose-Capillary: 299 mg/dL — ABNORMAL HIGH (ref 70–99)

## 2013-07-06 ENCOUNTER — Ambulatory Visit (INDEPENDENT_AMBULATORY_CARE_PROVIDER_SITE_OTHER): Payer: Medicare Other | Admitting: Internal Medicine

## 2013-07-06 ENCOUNTER — Encounter: Payer: Self-pay | Admitting: Internal Medicine

## 2013-07-06 ENCOUNTER — Encounter: Payer: Self-pay | Admitting: *Deleted

## 2013-07-06 ENCOUNTER — Ambulatory Visit (INDEPENDENT_AMBULATORY_CARE_PROVIDER_SITE_OTHER): Payer: Medicare Other | Admitting: Pharmacist

## 2013-07-06 VITALS — BP 151/84 | HR 58 | Temp 97.4°F | Ht 69.0 in | Wt 212.3 lb

## 2013-07-06 DIAGNOSIS — I10 Essential (primary) hypertension: Secondary | ICD-10-CM

## 2013-07-06 DIAGNOSIS — E1165 Type 2 diabetes mellitus with hyperglycemia: Secondary | ICD-10-CM

## 2013-07-06 DIAGNOSIS — I4891 Unspecified atrial fibrillation: Secondary | ICD-10-CM | POA: Diagnosis not present

## 2013-07-06 DIAGNOSIS — I951 Orthostatic hypotension: Secondary | ICD-10-CM

## 2013-07-06 DIAGNOSIS — K920 Hematemesis: Secondary | ICD-10-CM

## 2013-07-06 DIAGNOSIS — Z7901 Long term (current) use of anticoagulants: Secondary | ICD-10-CM

## 2013-07-06 LAB — POCT GLYCOSYLATED HEMOGLOBIN (HGB A1C): Hemoglobin A1C: 9.1

## 2013-07-06 LAB — CBC
HCT: 31.9 % — ABNORMAL LOW (ref 39.0–52.0)
Hemoglobin: 10.7 g/dL — ABNORMAL LOW (ref 13.0–17.0)
MCHC: 33.5 g/dL (ref 30.0–36.0)
MCV: 80.2 fL (ref 78.0–100.0)
WBC: 4.8 10*3/uL (ref 4.0–10.5)

## 2013-07-06 LAB — BASIC METABOLIC PANEL WITH GFR
BUN: 25 mg/dL — ABNORMAL HIGH (ref 6–23)
CO2: 26 mEq/L (ref 19–32)
Chloride: 105 mEq/L (ref 96–112)
GFR, Est Non African American: 30 mL/min — ABNORMAL LOW
Glucose, Bld: 159 mg/dL — ABNORMAL HIGH (ref 70–99)
Potassium: 3.7 mEq/L (ref 3.5–5.3)
Sodium: 140 mEq/L (ref 135–145)

## 2013-07-06 LAB — POCT INR: INR: 1.7

## 2013-07-06 LAB — PROTIME-INR: INR: 1.67 — ABNORMAL HIGH (ref <1.50)

## 2013-07-06 NOTE — Progress Notes (Signed)
Patient: Patrick [name]   MRN: [id number]  DOB: 19-Aug-1945  PCP: Marrion Coy, MD   Subjective:    CC: Follow-up   HPI: [name] is a 67 y.o. male with a PMHx of HTN, HLD, T2DM, MI/CAD, CVA, tage III,aroxysmal A Fib on Warfarin, OSA and depression, who presented to clinic today for the hospital follow up.  # Orthostatic hypotension Patient was hospitalized for orthostatic hypotension and A Fib with RVR 2/2 volume depletion from his N/V on 06/30/13. He was treated with IVF and discharged on stable condition. His Lasix was on hold and Diltiazem was started at lower dose of 30 BID. He denies dizziness, lightheaded and LOC.   # HTN    He had elevated blood pressure due to N/V and unable to keep oral intake. His Clonidine was started back, and his Diltiazem was started at lower dose of 30 BID upon discharge. Of note, his previous Diltiazem dose was 90 BID. His Lasix 80 mg BID is no hold upon discharge.   # hematemesis    Denies N/V/D and abdominal pain. No bloody emesis. He has a history of PUD and reports a couple of episodes of hematemesis last week, which resolved spontaneously. His HH was stable. He is discharged on Protonix. He would need GI referral for possible EGD.   # Severe protein-calories malnutrition   He was given supplement diet with Glucerna shakes BID between meals. Will need DM educator followup.   # T2DM, uncontrolled   Patient reports thirsty, polydipsia and polyuria. Denies blurry vision, hands/feet numbness. He endorses medical compliance. But unable to provide me the insulin dosage and states that he only takes it once daily ( He is NPH BID).  He does not think that he has talked to Butch Penny yet.    Review of Systems: Per HPI.   Current Outpatient Medications: Current Outpatient Prescriptions  Medication Sig Dispense Refill  . allopurinol (ZYLOPRIM) 100 MG tablet Take 2 tablets (200 mg total) by mouth daily.  60 tablet  2  . aspirin 81 MG EC tablet Take  81 mg by mouth daily.        . Blood Glucose Monitoring Suppl (ACCU-CHEK AVIVA PLUS) W/DEVICE KIT 1 each by Does not apply route as needed.  1 kit  0  . cloNIDine (CATAPRES) 0.3 MG tablet Take 1 tablet (0.3 mg total) by mouth 2 (two) times daily.  180 tablet  1  . diltiazem (CARDIZEM) 90 MG tablet Take 1 tablet (90 mg total) by mouth 2 (two) times daily.  180 tablet  2  . glipiZIDE (GLUCOTROL XL) 10 MG 24 hr tablet Take 1 tablet (10 mg total) by mouth daily.  30 tablet  3  . glucose blood (TRUETRACK TEST) test strip Use to test blood sugar 2-3 times daily dx code 250.00  100 each  11  . insulin NPH (HUMULIN N,NOVOLIN N) 100 UNIT/ML injection Inject 10-20 Units into the skin 2 (two) times daily. Takes 20 units in the morning and 10 units in the evening.      . Insulin Syringe-Needle U-100 (ELITE-THIN INS SYR .5CC/31G) 31G X 5/16" 0.5 ML MISC 1 each by Does not apply route 3 (three) times daily. Use to inject insulin daily  100 each  11  . Lancets 30G MISC Use to test blood glucose 2-3 times daily dx code 250.00  100 each  11  . pantoprazole (PROTONIX) 40 MG tablet Take 1 tablet (40 mg total) by mouth daily.  [place]  tablet  3  . pravastatin (PRAVACHOL) 80 MG tablet Take 1 tablet (80 mg total) by mouth daily.  30 tablet  5  . warfarin (COUMADIN) 5 MG tablet Take 0.5-1 tablets (2.5-5 mg total) by mouth daily. Take 2.5mg  on Monday, Wednesday, and Friday and 5mg  on Sun., Tues, Thurs, Sat.  30 tablet  3  . Omega-3 Fatty Acids (FISH OIL) 1000 MG CAPS Take 1 capsule by mouth 2 (two) times daily.        . potassium chloride (MICRO-K) 10 MEQ CR capsule Take 1 capsule (10 mEq total) by mouth 2 (two) times daily.  60 capsule  3   No current facility-administered medications for this visit.    Allergies: No Known Allergies  Past Medical History  Diagnosis Date  . Hypertension   . Sleep apnea 07/2010  . CKD (chronic kidney disease), stage II   . Paroxysmal atrial fibrillation     on coumadin  . CAD (coronary  artery disease)   . Depression   . Gout   . Hyperlipidemia   . 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 Atrial fibrillation   . Myocardial infarction   . GERD (gastroesophageal reflux disease)   . Shortness of breath     "can happen at any time" (07/01/2013)  . Diabetes mellitus type II     Insulin dependent  . Upper GI bleeding     "that's why I'm here" (07/01/2013)  . Stroke     "memory not as good since" (07/01/2013)    Objective:    Lying  BP 147/89 Sitting BP 151/84 Standing BP 122/79  Physical Exam: Filed Vitals:   07/06/13 1403 07/06/13 1418 07/06/13 1422 07/06/13 1424  BP: 180/94 147/89 122/79 151/84  Pulse: 59 56 60 58  Temp: 97.4 F (36.3 C)     TempSrc: Oral     Height: 5\' 9"  (1.753 m)     Weight: 212 lb 4.8 oz (96.299 kg)     SpO2: 100%        General: Vital signs reviewed and noted. Well-developed, well-nourished, in no acute distress; alert, appropriate and cooperative throughout examination.  Head: Normocephalic, atraumatic.  Lungs:  Normal respiratory effort. Clear to auscultation BL without crackles or wheezes.  Heart: Irregularly irregular. S1 and S2 normal without gallop, rubs, murmur. HR at 90's. Pulse rate at 60's  Abdomen:  BS normoactive. Soft, Nondistended, non-tender.  No masses or organomegaly.  Extremities: Chronic 2+ pitting pretibial edema.   Assessment/ Plan:

## 2013-07-06 NOTE — Assessment & Plan Note (Addendum)
BP Readings from Last 3 Encounters:  07/06/13 151/84  07/02/13 184/81  06/30/13 157/98    Lab Results  Component Value Date   Adrionna Delcid 140 07/06/2013   K 3.7 07/06/2013   CREATININE 2.19* 07/06/2013    Assessment: Blood pressure control: mildly elevated Progress toward BP goal:  improved BP is better than before. He reports taking his Clonidine and Diltiazem home dose. Unclear whether he is taking his Lasix.   Plan: Medications:  continue current medications Educational resources provided: brochure;handout;video Self management tools provided:   Other plans:  1. Continue his clonidine and Diltiazem home dose. 2. Hold lasix and potassium for now given orthostatic hypotension today.' 3. BMP today 4. Follow up in one week.  5. Patient understands above plan

## 2013-07-06 NOTE — Patient Instructions (Signed)
Patient instructed to take medications as defined in the Anti-coagulation Track section of this encounter.  Patient instructed to take today's dose.  Patient verbalized understanding of these instructions.    

## 2013-07-06 NOTE — Assessment & Plan Note (Addendum)
Lab Results  Component Value Date   HGBA1C 9.1 07/06/2013   HGBA1C 8.9 04/07/2013   HGBA1C 11.3 01/15/2013     Assessment: Diabetes control: poor control (HgbA1C >9%) Progress toward A1C goal:  deteriorated Patient states that he gives himself insulin injections. But he is not sure what kind of insulin or correct doses he uses. He seems not interested or engaged to talk about his insulin therapy during the [place]. I am concerning that he actually does not take his insulin.  His A1C is 9.1 today.   Plan: Medications:  will need thorough discussion with patient and his wife. Home glucose monitoring: does not check it.  Frequency:   Timing: N/A Instruction/counseling given: reminded to get eye exam, reminded to bring blood glucose meter & log to each visit, reminded to bring medications to each visit, discussed foot care, discussed the need for weight loss, discussed diet and discussed sick day management Educational resources provided: brochure;handout Self management tools provided:   Other plans:   1. We will need thorough discussion with him and wife about his DM therapy.  2. He will benefit from simplified DM regime  Basal Insulin daily instead of BID  The better option for him is Lantus Pen daily, which will eliminate additional steps involving shaking the bottle, drawing the syringe and measuring the correct dose.  He can benefit from GLP-receptor like victoza for weight loss and better control of his DM.  3. He will also need to follow up with DM educator Butch Penny. 4. Follow up with me in one week. He is instructed to bring his meter and medications. He agrees with come to the appt with his wife.

## 2013-07-06 NOTE — Assessment & Plan Note (Addendum)
Assessment: He had a couple of self reported hematemesis prior to his hospitalization. He denies any nausea, vomiting or bloody emesis during the hospitalization and since discharge. His baseline HH is ~13/39, and his hospital discharge [place] was 11.3-11.8/35. His repeat HH is 10.7/31.9 today.  Plan: - HH trended down slightly. The etiology could be the volume dilution ( received IVF and lasix is on hold) versus the true manifestation of blood loss from last week. - Will send GI referral for possible EGD given his previous history of PUD.  - Patient is instructed to call the clinic or go to ED if he experiences more hematemesis.

## 2013-07-06 NOTE — Patient Instructions (Signed)
1. Will check your blood work today. 2. Will send the GI referral for EGD 3. Please follow up in one week. Will need set up appt with me and Patrick [name].   Diabetes and Standards of Medical Care  Diabetes is complicated. You may find that your diabetes team includes a dietitian, nurse, diabetes educator, eye doctor, and more. To help everyone know what is going on and to help you get the care you deserve, the following schedule of care was developed to help keep you on track. Below are the tests, exams, vaccines, medicines, education, and plans you will need. HbA1c test This test shows how well you have controlled your glucose over the past 2 3 months. It is used to see if your diabetes management plan needs to be adjusted.   It is performed at least 2 times a year if you are meeting treatment goals.  It is performed 4 times a year if therapy has changed or if you are not meeting treatment goals. Blood pressure test  This test is performed at every routine medical visit. The goal is less than 140/90 mmHg for most people, but 130/80 mmHg in some cases. Ask your health care provider about your goal. Dental exam  Follow up with the dentist regularly. Eye exam  If you are diagnosed with type 1 diabetes as a child, get an exam upon reaching the age of 16 years or older and have had diabetes for 3 5 years. Yearly eye exams are recommended after that initial eye exam.  If you are diagnosed with type 1 diabetes as an adult, get an exam within 5 years of diagnosis and then yearly.  If you are diagnosed with type 2 diabetes, get an exam as soon as possible after the diagnosis and then yearly. Foot care exam  Visual foot exams are performed at every routine medical visit. The exams check for cuts, injuries, or other problems with the feet.  A comprehensive foot exam should be done yearly. This includes visual inspection as well as assessing foot pulses and testing for loss of sensation.  Check your  feet nightly for cuts, injuries, or other problems with your feet. Tell your health care provider if anything is not healing. Kidney function test (urine microalbumin)  This test is performed once a year.  Type 1 diabetes: The first test is performed 5 years after diagnosis.  Type 2 diabetes: The first test is performed at the time of diagnosis.  A serum creatinine and estimated glomerular filtration rate (eGFR) test is done once a year to assess the level of chronic kidney disease (CKD), if present. Lipid profile (cholesterol, HDL, LDL, triglycerides)  Performed every 5 years for most people.  The goal for LDL is less than 100 mg/dL. If you are at high risk, the goal is less than 70 mg/dL.  The goal for HDL is 40 mg/dL 50 mg/dL for men and 50 mg/dL 60 mg/dL for women. An HDL cholesterol of 60 mg/dL or higher gives some protection against heart disease.  The goal for triglycerides is less than 150 mg/dL. Influenza vaccine, pneumococcal vaccine, and hepatitis B vaccine  The influenza vaccine is recommended yearly.  The pneumococcal vaccine is generally given once in a lifetime. However, there are some instances when another vaccination is recommended. Check with your health care provider.  The hepatitis B vaccine is also recommended for adults with diabetes. Diabetes self-management education  Education is recommended at diagnosis and ongoing as needed. Treatment plan  Your treatment plan is reviewed at every medical visit. Document Released: 05/13/2009 Document Revised: 03/18/2013 Document Reviewed: 12/16/2012 [facility] Patient Information 2014 [place].

## 2013-07-06 NOTE — Assessment & Plan Note (Addendum)
Assessment: Patient was just hospitalized for orthostatic hypotension due to volume depletion 2/2 N/V on 06/30/13. He was instructed to hold Lasix and continue his Diltiazem at lower dose of 30 mg po BID. However, there is an misunderstanding of discharge medications, and he still takes Diltiazem home dose 90 BID along with Clonidine 0.3 BID. He is not sure whether he takes Lasix or not.   Patient is still positive for orthostatic hypotension by SBP dropping > 20 digits at the clinic today. He denies dizziness or lightheadedness. Physical exam unrevealing. Weight stable. No overt s/s dehydration or fluid overload.    Plan: - Patient is instructed to stop Lasix given his positive orthostatic hypotension.            He does not have overt s/s of fluid overload). Need close monitoring.    - Continue clonidine and Diltiazem home dose since we need better control his BP and A fib ( HR 90's today).  - Will check his BMP and CBC today for hospital followup labs.   - follow up with me in one week.   Patient is instructed to bring all of his pill bottles and boxes.   He is asked to come with his wife for thorough evaluation of home meds.   - Patient understands and agrees with the plan.

## 2013-07-06 NOTE — Progress Notes (Signed)
Anti-Coagulation Progress Note  Patrick [name] is a 67 y.o. male who is currently on an anti-coagulation regimen.    RECENT RESULTS: Recent results are below, the most recent result is correlated with a dose of 27.5 mg. per week but with 2 omitted doses while hospitalized last week he states. Lab Results  Component Value Date   INR 1.70 07/06/2013   INR 1.67* 07/06/2013   INR 1.24 07/02/2013    ANTI-COAG DOSE: Anticoagulation Dose Instructions as of 07/06/2013     [name] Tue Wed Thu Fri Sat   New Dose 5 mg 5 mg 5 mg 2.5 mg 5 mg 2.5 mg 5 mg       ANTICOAG SUMMARY: Anticoagulation Episode Summary   Current INR goal 2.0-3.0  Next INR check 07/13/2013  INR from last check 1.70! (07/06/2013)  Weekly max dose   Target end date Indefinite  INR check location   Preferred lab   Send INR reminders to ANTICOAG IMP   Indications  ATRIAL FIBRILLATION PAROXYSMAL CHRONIC [427.31] Long term current use of anticoagulant [V58.61]        Comments       Anticoagulation Care Providers   Provider Role Specialty Phone number   Bartholomew Crews, MD  Internal Medicine [phone]      ANTICOAG TODAY: Anticoagulation Summary as of 07/06/2013   INR goal 2.0-3.0  Selected INR 1.70! (07/06/2013)  Next INR check 07/13/2013  Target end date Indefinite   Indications  ATRIAL FIBRILLATION PAROXYSMAL CHRONIC [427.31] Long term current use of anticoagulant [V58.61]      Anticoagulation Episode Summary   INR check location    Preferred lab    Send INR reminders to ANTICOAG IMP   Comments     Anticoagulation Care Providers   Provider Role Specialty Phone number   Bartholomew Crews, MD  Internal Medicine [phone]      PATIENT INSTRUCTIONS: Patient Instructions  Patient instructed to take medications as defined in the Anti-coagulation Track section of this encounter.  Patient instructed to take today's dose.  Patient verbalized understanding of these instructions.        FOLLOW-UP Return in 7 days (on 07/13/2013) for Follow up INR at [place], III Pharm.D., CACP

## 2013-07-07 NOTE — Progress Notes (Signed)
Case discussed with Dr. Nicoletta Dress at time of visit.  We reviewed the resident's history and exam and pertinent patient test results.  I agree with the assessment, diagnosis, and plan of care documented in the resident's note.

## 2013-07-09 ENCOUNTER — Telehealth: Payer: Self-pay | Admitting: Licensed Clinical Social Worker

## 2013-07-09 ENCOUNTER — Other Ambulatory Visit: Payer: Self-pay | Admitting: *Deleted

## 2013-07-09 MED ORDER — POTASSIUM CHLORIDE ER 10 MEQ PO CPCR: 10.0000 meq | ORAL_CAPSULE | Freq: Two times a day (BID) | ORAL | Status: DC

## 2013-07-09 MED ORDER — PRAVASTATIN SODIUM 80 MG PO TABS: 80.0000 mg | ORAL_TABLET | Freq: Every day | ORAL | Status: DC

## 2013-07-09 NOTE — Telephone Encounter (Signed)
CSW placed call to Mr. Saadeh to assess desire for community care management.  Referral to [facility] has been made in the past, CSW placed call to [facility] to determine last contact with pt.  Message left with [facility].  CSW placed call to pt, pt sleeping at this time, spouse requesting to take message.  CSW informed spouse, physician wanted to refer pt to [facility] for community care management.  Spouse states she will pass on to Mr. Fiegel.  Spouse requesting phone number to [facility].  CSW informed Mrs. Haverland to feel free to contact [facility] to discuss services available, should pt want to be linked to contact CSW for referral to be placed.  Spouse provided with CSW contact information.

## 2013-07-13 ENCOUNTER — Ambulatory Visit: Payer: Medicare Other

## 2013-07-14 ENCOUNTER — Other Ambulatory Visit: Payer: Self-pay | Admitting: Internal Medicine

## 2013-07-14 ENCOUNTER — Inpatient Hospital Stay (HOSPITAL_COMMUNITY)
Admission: AD | Admit: 2013-07-14 | Discharge: 2013-07-18 | DRG: 310 | Disposition: A | Discharge status: Home / Self Care | Payer: Medicare Other | Source: Ambulatory Visit | Attending: Internal Medicine | Admitting: Internal Medicine

## 2013-07-14 ENCOUNTER — Ambulatory Visit (HOSPITAL_COMMUNITY)
Admission: RE | Admit: 2013-07-14 | Discharge: 2013-07-14 | Disposition: A | Discharge status: Home / Self Care | Payer: Medicare Other | Source: Ambulatory Visit | Attending: Internal Medicine | Admitting: Internal Medicine

## 2013-07-14 ENCOUNTER — Ambulatory Visit (INDEPENDENT_AMBULATORY_CARE_PROVIDER_SITE_OTHER): Payer: Medicare Other | Admitting: Internal Medicine

## 2013-07-14 ENCOUNTER — Encounter: Payer: Medicare Other | Admitting: Dietician

## 2013-07-14 ENCOUNTER — Encounter: Payer: Self-pay | Admitting: Internal Medicine

## 2013-07-14 ENCOUNTER — Encounter (HOSPITAL_COMMUNITY): Payer: Self-pay | Admitting: General Practice

## 2013-07-14 VITALS — BP 192/86 | HR 49 | Temp 97.0°F | Wt 215.1 lb

## 2013-07-14 DIAGNOSIS — I4430 Unspecified atrioventricular block: Secondary | ICD-10-CM | POA: Diagnosis present

## 2013-07-14 DIAGNOSIS — I498 Other specified cardiac arrhythmias: Principal | ICD-10-CM | POA: Diagnosis present

## 2013-07-14 DIAGNOSIS — E785 Hyperlipidemia, unspecified: Secondary | ICD-10-CM | POA: Diagnosis present

## 2013-07-14 DIAGNOSIS — IMO0001 Reserved for inherently not codable concepts without codable children: Secondary | ICD-10-CM | POA: Diagnosis not present

## 2013-07-14 DIAGNOSIS — I129 Hypertensive chronic kidney disease with stage 1 through stage 4 chronic kidney disease, or unspecified chronic kidney disease: Secondary | ICD-10-CM | POA: Diagnosis present

## 2013-07-14 DIAGNOSIS — I2510 Atherosclerotic heart disease of native coronary artery without angina pectoris: Secondary | ICD-10-CM | POA: Diagnosis present

## 2013-07-14 DIAGNOSIS — I10 Essential (primary) hypertension: Secondary | ICD-10-CM | POA: Diagnosis not present

## 2013-07-14 DIAGNOSIS — N184 Chronic kidney disease, stage 4 (severe): Secondary | ICD-10-CM | POA: Diagnosis present

## 2013-07-14 DIAGNOSIS — N182 Chronic kidney disease, stage 2 (mild): Secondary | ICD-10-CM | POA: Diagnosis present

## 2013-07-14 DIAGNOSIS — R001 Bradycardia, unspecified: Secondary | ICD-10-CM

## 2013-07-14 DIAGNOSIS — G4730 Sleep apnea, unspecified: Secondary | ICD-10-CM | POA: Diagnosis not present

## 2013-07-14 DIAGNOSIS — Z8673 Personal history of transient ischemic attack (TIA), and cerebral infarction without residual deficits: Secondary | ICD-10-CM | POA: Diagnosis not present

## 2013-07-14 DIAGNOSIS — Z7901 Long term (current) use of anticoagulants: Secondary | ICD-10-CM

## 2013-07-14 DIAGNOSIS — I4821 Permanent atrial fibrillation: Secondary | ICD-10-CM | POA: Diagnosis present

## 2013-07-14 DIAGNOSIS — G4733 Obstructive sleep apnea (adult) (pediatric): Secondary | ICD-10-CM | POA: Diagnosis present

## 2013-07-14 DIAGNOSIS — F329 Major depressive disorder, single episode, unspecified: Secondary | ICD-10-CM | POA: Diagnosis present

## 2013-07-14 DIAGNOSIS — E119 Type 2 diabetes mellitus without complications: Secondary | ICD-10-CM | POA: Diagnosis present

## 2013-07-14 DIAGNOSIS — M109 Gout, unspecified: Secondary | ICD-10-CM | POA: Diagnosis present

## 2013-07-14 DIAGNOSIS — F3289 Other specified depressive episodes: Secondary | ICD-10-CM | POA: Diagnosis present

## 2013-07-14 DIAGNOSIS — I4891 Unspecified atrial fibrillation: Secondary | ICD-10-CM

## 2013-07-14 DIAGNOSIS — E782 Mixed hyperlipidemia: Secondary | ICD-10-CM | POA: Diagnosis present

## 2013-07-14 DIAGNOSIS — N185 Chronic kidney disease, stage 5: Secondary | ICD-10-CM | POA: Diagnosis present

## 2013-07-14 DIAGNOSIS — Z794 Long term (current) use of insulin: Secondary | ICD-10-CM | POA: Diagnosis not present

## 2013-07-14 DIAGNOSIS — I495 Sick sinus syndrome: Secondary | ICD-10-CM | POA: Diagnosis present

## 2013-07-14 DIAGNOSIS — I252 Old myocardial infarction: Secondary | ICD-10-CM | POA: Diagnosis not present

## 2013-07-14 DIAGNOSIS — N183 Chronic kidney disease, stage 3 unspecified: Secondary | ICD-10-CM | POA: Diagnosis not present

## 2013-07-14 DIAGNOSIS — E1165 Type 2 diabetes mellitus with hyperglycemia: Secondary | ICD-10-CM

## 2013-07-14 HISTORY — DX: Sick sinus syndrome: I49.5

## 2013-07-14 HISTORY — DX: Gastric ulcer, unspecified as acute or chronic, without hemorrhage or perforation: K25.9

## 2013-07-14 HISTORY — DX: Permanent atrial fibrillation: I48.21

## 2013-07-14 HISTORY — DX: Long term (current) use of anticoagulants: Z79.01

## 2013-07-14 LAB — CBC
HCT: 33.2 % — ABNORMAL LOW (ref 39.0–52.0)
Hemoglobin: 10.8 g/dL — ABNORMAL LOW (ref 13.0–17.0)
MCH: 26.4 pg (ref 26.0–34.0)
RBC: 4.09 MIL/uL — ABNORMAL LOW (ref 4.22–5.81)

## 2013-07-14 LAB — COMPREHENSIVE METABOLIC PANEL
ALT: 25 U/L (ref 0–53)
Albumin: 3.4 g/dL — ABNORMAL LOW (ref 3.5–5.2)
Alkaline Phosphatase: 111 U/L (ref 39–117)
BUN: 24 mg/dL — ABNORMAL HIGH (ref 6–23)
Calcium: 8.6 mg/dL (ref 8.4–10.5)
Creatinine, Ser: 1.88 mg/dL — ABNORMAL HIGH (ref 0.50–1.35)
GFR calc Af Amer: 42 mL/min — ABNORMAL LOW (ref >90)
Glucose, Bld: 139 mg/dL — ABNORMAL HIGH (ref 70–99)
Potassium: 4.2 mEq/L (ref 3.5–5.1)
Sodium: 137 mEq/L (ref 135–145)
Total Protein: 6.5 g/dL (ref 6.0–8.3)

## 2013-07-14 LAB — PHOSPHORUS: Phosphorus: 2.6 mg/dL (ref 2.3–4.6)

## 2013-07-14 LAB — GLUCOSE, CAPILLARY
Glucose-Capillary: 133 mg/dL — ABNORMAL HIGH (ref 70–99)
Glucose-Capillary: 156 mg/dL — ABNORMAL HIGH (ref 70–99)

## 2013-07-14 LAB — PROTIME-INR: INR: 1.52 — ABNORMAL HIGH (ref 0.00–1.49)

## 2013-07-14 MED ORDER — DILTIAZEM HCL 90 MG PO TABS
90.0000 mg | ORAL_TABLET | Freq: Two times a day (BID) | ORAL | Status: DC
  Filled 2013-07-14: qty 1

## 2013-07-14 MED ORDER — WARFARIN SODIUM 2.5 MG PO TABS
2.5000 mg | ORAL_TABLET | Freq: Once | ORAL | Status: AC
  Administered 2013-07-14: 2.5 mg via ORAL
  Filled 2013-07-14: qty 1

## 2013-07-14 MED ORDER — INSULIN GLARGINE 100 UNIT/ML ~~LOC~~ SOLN
10.0000 [IU] | Freq: Every day | SUBCUTANEOUS | Status: DC
  Administered 2013-07-14 – 2013-07-17 (×4): 10 [IU] via SUBCUTANEOUS
  Filled 2013-07-14 (×5): qty 0.1

## 2013-07-14 MED ORDER — PRAVASTATIN SODIUM 40 MG PO TABS
80.0000 mg | ORAL_TABLET | Freq: Every day | ORAL | Status: DC
  Administered 2013-07-15 – 2013-07-17 (×3): 80 mg via ORAL
  Filled 2013-07-14 (×4): qty 2

## 2013-07-14 MED ORDER — AMLODIPINE BESYLATE 5 MG PO TABS
5.0000 mg | ORAL_TABLET | Freq: Every day | ORAL | Status: DC
  Administered 2013-07-14: 5 mg via ORAL
  Filled 2013-07-14: qty 1

## 2013-07-14 MED ORDER — HYDRALAZINE HCL 20 MG/ML IJ SOLN
5.0000 mg | INTRAMUSCULAR | Status: DC | PRN
  Administered 2013-07-15 (×3): 5 mg via INTRAVENOUS
  Filled 2013-07-14 (×3): qty 1

## 2013-07-14 MED ORDER — SODIUM CHLORIDE 0.9 % IJ SOLN
3.0000 mL | Freq: Two times a day (BID) | INTRAMUSCULAR | Status: DC
  Administered 2013-07-15 – 2013-07-18 (×4): 3 mL via INTRAVENOUS

## 2013-07-14 MED ORDER — HYDRALAZINE HCL 50 MG PO TABS
50.0000 mg | ORAL_TABLET | Freq: Four times a day (QID) | ORAL | Status: DC
  Administered 2013-07-15 – 2013-07-17 (×12): 50 mg via ORAL
  Filled 2013-07-14 (×14): qty 1

## 2013-07-14 MED ORDER — INSULIN ASPART 100 UNIT/ML ~~LOC~~ SOLN
0.0000 [IU] | SUBCUTANEOUS | Status: DC
  Administered 2013-07-15: via SUBCUTANEOUS
  Administered 2013-07-15 – 2013-07-16 (×2): 3 [IU] via SUBCUTANEOUS
  Administered 2013-07-16: 1 [IU] via SUBCUTANEOUS
  Administered 2013-07-16: 2 [IU] via SUBCUTANEOUS
  Administered 2013-07-17: 3 [IU] via SUBCUTANEOUS
  Administered 2013-07-17: 1 [IU] via SUBCUTANEOUS
  Administered 2013-07-17 – 2013-07-18 (×3): 3 [IU] via SUBCUTANEOUS

## 2013-07-14 MED ORDER — WARFARIN - PHARMACIST DOSING INPATIENT: Freq: Every day | Status: DC

## 2013-07-14 MED ORDER — INSULIN ASPART 100 UNIT/ML ~~LOC~~ SOLN
0.0000 [IU] | SUBCUTANEOUS | Status: DC
Start: 2013-07-14 — End: 2013-07-14

## 2013-07-14 MED ORDER — SIMVASTATIN 40 MG PO TABS: 40.0000 mg | ORAL_TABLET | Freq: Every day | ORAL | Status: DC

## 2013-07-14 MED ORDER — ALLOPURINOL 100 MG PO TABS
100.0000 mg | ORAL_TABLET | Freq: Every day | ORAL | Status: DC
  Administered 2013-07-15 – 2013-07-18 (×4): 100 mg via ORAL
  Filled 2013-07-14 (×4): qty 1

## 2013-07-14 MED ORDER — ASPIRIN EC 81 MG PO TBEC
81.0000 mg | DELAYED_RELEASE_TABLET | Freq: Every day | ORAL | Status: DC
  Administered 2013-07-15 – 2013-07-18 (×4): 81 mg via ORAL
  Filled 2013-07-14 (×5): qty 1

## 2013-07-14 MED ORDER — AMLODIPINE BESYLATE 5 MG PO TABS: 5.0000 mg | ORAL_TABLET | Freq: Every day | ORAL | Status: DC

## 2013-07-14 NOTE — Telephone Encounter (Signed)
CSW placed call to pt to determine desire to be linked with [facility].  Pt has an appt with afternoon, will request PCP to inquire for pt's motivation and desire to be linked with [facility].

## 2013-07-14 NOTE — Progress Notes (Signed)
Subjective:   Patient ID: Patrick [name] male   DOB: 07/12/1946 67 y.o.   MRN: [id number]  HPI: Patrick [name] is a 67 y.o. man recently discharge after being admitted for hypotension. He was discharged on a lowered dose of dilt (30 mg BID), but he continued to take 90 mg BID. In the clinic today he complains of fatigue and malaise. Denies SOB, dizziness, or presyncopal symptoms. See assessment in p[lan below.  Past Medical History  Diagnosis Date  . Hypertension   . Sleep apnea 07/2010  . CKD (chronic kidney disease), stage II   . Paroxysmal atrial fibrillation     on coumadin  . CAD (coronary artery disease)   . Depression   . Gout   . Hyperlipidemia   . 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 Atrial fibrillation   . Myocardial infarction   . GERD (gastroesophageal reflux disease)   . Shortness of breath     "can happen at any time" (07/01/2013)  . Diabetes mellitus type II     Insulin dependent  . Upper GI bleeding     "that's why I'm here" (07/01/2013)  . Stroke     "memory not as good since" (07/01/2013)   Current Outpatient Prescriptions  Medication Sig Dispense Refill  . allopurinol (ZYLOPRIM) 100 MG tablet TAKE TWO TABLETS BY MOUTH ONCE DAILY  60 tablet  0  . aspirin 81 MG EC tablet Take 81 mg by mouth daily.        . Blood Glucose Monitoring Suppl (ACCU-CHEK AVIVA PLUS) W/DEVICE KIT 1 each by Does not apply route as needed.  1 kit  0  . cloNIDine (CATAPRES) 0.3 MG tablet Take 1 tablet (0.3 mg total) by mouth 2 (two) times daily.  180 tablet  1  . diltiazem (CARDIZEM) 90 MG tablet Take 1 tablet (90 mg total) by mouth 2 (two) times daily.  180 tablet  2  . glipiZIDE (GLUCOTROL XL) 10 MG 24 hr tablet TAKE ONE TABLET BY MOUTH ONCE DAILY  30 tablet  3  . glucose blood (TRUETRACK TEST) test strip Use to test blood sugar 2-3 times daily dx code 250.00  100 each  11  . insulin NPH (HUMULIN N,NOVOLIN N) 100 UNIT/ML injection  Inject 10-20 Units into the skin 2 (two) times daily. Takes 20 units in the morning and 10 units in the evening.      . Insulin Syringe-Needle U-100 (ELITE-THIN INS SYR .5CC/31G) 31G X 5/16" 0.5 ML MISC 1 each by Does not apply route 3 (three) times daily. Use to inject insulin daily  100 each  11  . Lancets 30G MISC Use to test blood glucose 2-3 times daily dx code 250.00  100 each  11  . Omega-3 Fatty Acids (FISH OIL) 1000 MG CAPS Take 1 capsule by mouth 2 (two) times daily.        . pantoprazole (PROTONIX) 40 MG tablet Take 1 tablet (40 mg total) by mouth daily.  30 tablet  3  . potassium chloride (MICRO-K) 10 MEQ CR capsule Take 1 capsule (10 mEq total) by mouth 2 (two) times daily.  60 capsule  0  . pravastatin (PRAVACHOL) 80 MG tablet Take 1 tablet (80 mg total) by mouth daily.  30 tablet  6  . warfarin (COUMADIN) 5 MG tablet Take 0.5-1 tablets (2.5-5 mg total) by mouth daily. Take 2.5mg  on Monday, Wednesday, and Friday and 5mg  on Sun.,  Tues, Thurs, Sat.  30 tablet  3   No current facility-administered medications for this visit.   No family history on file. History   Social History  . Marital Status: Married    [name]    Number of Children: N/A  . Years of Education: 5   Occupational History  .     Social History Main Topics  . Smoking status: Former Smoker -- 0.50 packs/day for 15 years    Types: Cigarettes    Quit date: 10/26/1976  . Smokeless tobacco: Former Systems developer  . Alcohol Use: No  . Drug Use: No  . Sexual Activity: No   Other Topics Concern  . None   Social History Narrative  . None   Review of Systems:  Review of Systems  Constitutional: Positive for chills and malaise/fatigue. Negative for fever and diaphoresis.  HENT: Negative for sore throat.   Eyes: Negative for blurred vision, double vision and photophobia.  Respiratory: Negative for cough, hemoptysis, sputum production and shortness of breath.   Cardiovascular: Negative for chest pain,  palpitations, orthopnea, claudication, leg swelling and PND.  Skin: Negative for itching and rash.  Neurological: Positive for weakness. Negative for headaches.     Objective:  Physical Exam: Filed Vitals:   07/14/13 1350  BP: 192/86  Pulse: 49  Temp: 97 F (36.1 C)  TempSrc: Oral  Weight: 215 lb 1.6 oz (97.569 kg)  SpO2: 100%   Physical Exam  Constitutional: He is oriented to person, place, and time. He appears well-developed and well-nourished. No distress.  HENT:  Head: Normocephalic and atraumatic.  Eyes: EOM are normal. Pupils are equal, round, and reactive to light.  Cardiovascular: Normal heart sounds and intact distal pulses.  Exam reveals no friction rub.   No murmur heard. Bradycardic at 35, irregular  Pulmonary/Chest: Effort normal and breath sounds normal. No respiratory distress. He has no wheezes. He has no rales. He exhibits no tenderness.  Abdominal: Soft. Bowel sounds are normal. He exhibits no distension. There is no tenderness.  Neurological: He is alert and oriented to person, place, and time.  Skin: He is not diaphoretic.  Psychiatric:  Depressed mood and affect. Slow speech.    Assessment & Plan:    I spent greater than 30 minutes with the patient discussing his medications and the associated side effects. We discussed the benefits of statin therapy, despite that Dr. Irena Cords says it is a bad medication. I explained that, as the patient has had a stroke and has known CAD, there is very good evidence that the patient will be protected by taking a statin. I also discussed that the patient may need to change his blood pressure medications because his HR is too low. The patient was in agreement with that plan. Further, we discussed that the patient wanted to decrease his meds. As he just recently started a PPI and has no symptoms of GERD at this time, I suggested discontinuing his PPI. He agreed with this plan.   In terms of his bradycardia, he was in afib with HR  in 30's. There was concern for tachy brady syndrome given recent tachycardia. His last echocardiogram was in 2007, thus there is concern regarding his heart function. As he was hypertensive in the clinic 190/80, there was concern about lowering his BP medications. Therefore, we elected to admit the patient for observation overnight while dilt was held and he was restarted on a lower dose. It is likely that he may need the  additional anti-hypertensive such as amlodipine or an ACE/ARB. He likely needs a cardiology consult regarding his tachy-brady syndrome.

## 2013-07-14 NOTE — H&P (Signed)
Date: 07/14/2013               Patient Name:  Patrick [name] MRN: [id number]  DOB: [birth year] Age / Sex: 67 y.o., male   PCP: Marrion Coy, MD         Medical Service: Internal Medicine Teaching Service         Attending Physician: Dr. Axel Filler, MD    First Contact: Dr. Denton Brick Pager: [phone]  Second Contact: Dr. Alice Rieger Pager: [phone]       After Hours (After 5p/  First Contact Pager: [phone]  weekends / holidays): Second Contact Pager: [phone]   Chief Complaint: Bradycardia.  History of Present Illness: Patrick [name] is a 61 y o male with PMH - depression, CAD, OSA- has a CPAp but dosent use it, Paroxysmal A fib, HLD, CKD3, HTN, DM2, Gout.  Presented today for his routine clinic follow up. He was then found to be bradycardic- pulse in the 20-30s with elevated blood pressure- Systolic- [id number] and was therefore admitted. No complainst of dizziness, lightheadedness or falls. Wife says pt is a bit SOB, but on further questioning, husband denies this says there is no change in the number of pillows he uses, and no change in the distance he works before he becomes SOB. But wife does say that pt wife is abit more swollen than usual. Pt is on dialtizem for Atria fib, and was taking 90mg  BID instead of 30mg  Po BID, but as per chat pt was told to cont at this dose. Pt is also on clonidine- 0.3mg  BID. Pt and his wife say that he takes his meds accordingly. But cannot tell exactly what dose of meds pt is taking.  Meds: Current Facility-Administered Medications  Medication Dose Route Frequency Provider Last Rate Last Dose  . [START ON 07/15/2013] allopurinol (ZYLOPRIM) tablet 100 mg  100 mg Oral Daily Jessee Avers, MD      . aspirin EC tablet 81 mg  81 mg Oral Daily Jessee Avers, MD      . diltiazem (CARDIZEM) tablet 90 mg  90 mg Oral BID Jessee Avers, MD      . sodium chloride 0.9 % injection 3 mL  3 mL Intravenous Q12H Jessee Avers, MD         Allergies: Allergies as of 07/14/2013  . (No Known Allergies)   Past Medical History  Diagnosis Date  . Hypertension   . CKD (chronic kidney disease), stage II   . CAD (coronary artery disease)   . Depression   . Gout   . Hyperlipidemia   . 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 GERD (gastroesophageal reflux disease)   . Diabetes mellitus type II     Insulin dependent  . Upper GI bleeding 07/01/2013  . Paroxysmal atrial fibrillation     on coumadin  . Atrial fibrillation   . Myocardial infarction 2000's    "near heart attack" (07/14/2013)  . Shortness of breath     "can happen at any time" (07/14/2013)  . Sleep apnea 07/2010    "has mask at home; seldom uses it" (07/14/2013)  . Stomach ulcer 1950's    "as a teenager"  . Stroke ~ 2007; ~ 2009    "memory not as good since" (07/14/2013)   Past Surgical History  Procedure Laterality Date  . Cardiac catheterization  07/2003    /encounter notes 08/27/2005 (07/01/2013)   History reviewed. No pertinent family  history. History   Social History  . Marital Status: Married    [name]    Number of Children: N/A  . Years of Education: 5   Occupational History  .     Social History Main Topics  . Smoking status: Former Smoker -- 0.50 packs/day for 15 years    Types: Cigarettes    Quit date: 10/26/1976  . Smokeless tobacco: Former Systems developer     Comment: 07/14/2013 "quit chewing and dipping in ~ 1970"  . Alcohol Use: Yes     Comment: 07/14/2013 "drank a little; quit in ~ 1970; never had problem w/it"  . Drug Use: No  . Sexual Activity: Not Currently   Other Topics Concern  . Not on file   Social History Narrative  . No narrative on file    Review of Systems: CONSTITUTIONAL- No Fever, weightloss, night sweat,or change in appetite. SKIN- No Rash, colour changes,or  itching. HEAD- No Headache, or dizziness. Mouth/throat- No Sorethroat, dentures, bleeding  gums. RESPIRATORY- No Cough,or SOB. CARDIAC- No Palpitations, DOE, PND, or chest pain. GI- no vomiting, diarrhoea, constipation,or abd pain. URINARY- No Frequency, or dysuria NEUROLOGIC- Numbness, syncope, burning.  Physical Exam: Blood pressure 169/87, pulse 30, temperature 97.6 F (36.4 C), temperature source Oral, SpO2 100.00%. GENERAL- alert, co-operative, appears as stated age, not in any distress. HEENT- Atraumatic, normocephalic, PERRL, EOMI, oral mucosa appears moist, No cervical LN enlargement, thyroid does not appear enlarged. CARDIAC- Bradycardic, irregular , no murmurs, rubs or gallops. RESP- Moving equal volumes of air, and clear to auscultation bilaterally, no crackles, or rales. ABDOMEN- Soft, nontender, no palpable masses or organomegaly, bowel sounds present. BACK- Normal curvature of the spine, No tenderness along the vertebrae, no CVA tenderness. NEURO- No obvious Cr N 2-12 abnormality, strenght equal and present in all extremities.  EXTREMITIES- pulse 2+, symmetric, 2+ pitting pedal edema. SKIN- Warm, dry, No rash or lesion. PSYCH- Normal mood and affect, appropriate thought content and speech.  Lab results: Basic Metabolic Panel: No results found for this basename: NA, K, CL, CO2, GLUCOSE, BUN, CREATININE, CALCIUM, MG, PHOS,  in the last 72 hours Liver Function Tests: No results found for this basename: AST, ALT, ALKPHOS, BILITOT, PROT, ALBUMIN,  in the last 72 hours No results found for this basename: LIPASE, AMYLASE,  in the last 72 hours No results found for this basename: AMMONIA,  in the last 72 hours CBC:  Recent Labs  07/14/13 1825  WBC 4.2  HGB 10.8*  HCT 33.2*  MCV 81.2  PLT 216   Cardiac Enzymes: No results found for this basename: CKTOTAL, CKMB, CKMBINDEX, TROPONINI,  in the last 72 hours BNP: No results found for this basename: PROBNP,  in the last 72 hours D-Dimer: No results found for this basename: DDIMER,  in the last 72  hours CBG:  Recent Labs  07/14/13 1632  GLUCAP 133*   Hemoglobin A1C: No results found for this basename: HGBA1C,  in the last 72 hours Fasting Lipid Panel: No results found for this basename: CHOL, HDL, LDLCALC, TRIG, CHOLHDL, LDLDIRECT,  in the last 72 hours Thyroid Function Tests: No results found for this basename: TSH, T4TOTAL, FREET4, T3FREE, THYROIDAB,  in the last 72 hours Anemia Panel: No results found for this basename: VITAMINB12, FOLATE, FERRITIN, TIBC, IRON, RETICCTPCT,  in the last 72 hours Coagulation:  Recent Labs  07/14/13 1825  LABPROT 17.9*  INR 1.52*   Urine Drug Screen: Drugs of Abuse     Component Value  Date/Time   LABOPIA NONE DETECTED 11/22/2010 0831   COCAINSCRNUR NONE DETECTED 11/22/2010 0831   LABBENZ NONE DETECTED 11/22/2010 0831   AMPHETMU NONE DETECTED 11/22/2010 0831   THCU NONE DETECTED 11/22/2010 0831   LABBARB  Value: NONE DETECTED        DRUG SCREEN FOR MEDICAL PURPOSES ONLY.  IF CONFIRMATION IS NEEDED FOR ANY PURPOSE, NOTIFY LAB WITHIN 5 DAYS.        LOWEST DETECTABLE LIMITS FOR URINE DRUG SCREEN Drug Class       Cutoff (ng/mL) Amphetamine      1000 Barbiturate      200 Benzodiazepine   [id number] Tricyclics       [id number] Opiates          300 Cocaine          300 THC              50 11/22/2010 0831    Imaging results:  No results found.  Other results: EKG: No discernable P waves, irreg, QRS complex <0.12s,  Qtc- 414.    Assessment & Plan by Problem:  Bradycardia- Pt pulse has been in the 30s since admission. From EKG- pt has atria fib, with slow vent response, likely due to AV block. Pt is completely asymptomatic- No dizziness, no hypotension, with normal mental status. Will manage conservatively for now. This could to due to medication overdose, Diltiazem, clonidine and potassium, but K and mag levles are within normal limits. Other possible cause CAD- infltrative disease, acute or chronic ischemic heart disease. - Cardiology Consulted. - Avoid meds  that can lower heart rate. - Echo with contrast - Consider Atropine if pt becomes symptomatic. - Cycle trops - Repeat EKG in the morning.  Atria Fib- Home meds- Coumadin, and Cardizem- 90mg  BID. Will hold cardizem on admission, considering bradycardia.  HTN- Home meds- Clonidine- 0.3mg  tab BID. Currently held due to bradycardia. Will commnce Amlodipine at 5mg  daily and titrate up as needed. As it acts more on the blood vessels with less propensity for cardiac effects and hence worsening of pt bradycardia.  DM2- Home meds- NPH insulin- 20u -morning, 10u at night. Last HBA1c- 07/06/2013- 9.1. - SSI- S - CBGs before meals and at bedtime.  HLD- Home meds- pravastatin- 80mg  dly. Last lipid profile- 02/12/2013- LDL- 68, total- 138. - Commnence Simvastatin which is on hosp formulary- 40mg  daily.  CAD- Hx of non-obstructive CAD, Appears stable, no chest pain. Home Meds- Aspirin 81mg  daily, cont pravastatin- 80mg  daily.   Dispo: Disposition is deferred at this time, awaiting improvement of current medical problems.   The patient does have a current PCP Marrion Coy, MD) and does need an [facility] hospital follow-up appointment after discharge.  The patient does not know have transportation limitations that hinder transportation to clinic appointments.  Signed: Jenetta Downer, MD 07/14/2013, 7:22 PM

## 2013-07-14 NOTE — Progress Notes (Addendum)
ANTICOAGULATION CONSULT NOTE - Initial Consult  Pharmacy Consult for coumadin Indication: atrial fibrillation  No Known Allergies  Patient Measurements:   Heparin Dosing Weight:   Vital Signs: Temp: 97.6 F (36.4 C) (12/16 1628) Temp src: Oral (12/16 1628) BP: 169/87 mmHg (12/16 1718) Pulse Rate: 30 (12/16 1718)  Labs:  Recent Labs  07/14/13 1825  HGB 10.8*  HCT 33.2*  PLT 216  LABPROT 17.9*  INR 1.52*    The CrCl is unknown because both a height and weight (above a minimum accepted value) are required for this calculation.   Medical History: Past Medical History  Diagnosis Date  . Hypertension   . CKD (chronic kidney disease), stage II   . CAD (coronary artery disease)   . Depression   . Gout   . Hyperlipidemia   . 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 GERD (gastroesophageal reflux disease)   . Diabetes mellitus type II     Insulin dependent  . Upper GI bleeding 07/01/2013  . Paroxysmal atrial fibrillation     on coumadin  . Atrial fibrillation   . Myocardial infarction 2000's    "near heart attack" (07/14/2013)  . Shortness of breath     "can happen at any time" (07/14/2013)  . Sleep apnea 07/2010    "has mask at home; seldom uses it" (07/14/2013)  . Stomach ulcer 1950's    "as a teenager"  . Stroke ~ 2007; ~ 2009    "memory not as good since" (07/14/2013)    Medications:  Prescriptions prior to admission  Medication Sig Dispense Refill  . allopurinol (ZYLOPRIM) 100 MG tablet Take 200 mg by mouth daily.      Marland Kitchen aspirin 81 MG EC tablet Take 81 mg by mouth daily.        . Blood Glucose Monitoring Suppl (ACCU-CHEK AVIVA PLUS) W/DEVICE KIT 1 each by Does not apply route as needed.  1 kit  0  . cloNIDine (CATAPRES) 0.3 MG tablet Take 1 tablet (0.3 mg total) by mouth 2 (two) times daily.  180 tablet  1  . diltiazem (CARDIZEM) 90 MG tablet Take 1 tablet (90 mg total) by mouth 2 (two) times daily.  180  tablet  2  . glipiZIDE (GLUCOTROL XL) 10 MG 24 hr tablet Take 10 mg by mouth daily with breakfast.      . glucose blood (TRUETRACK TEST) test strip Use to test blood sugar 2-3 times daily dx code 250.00  100 each  11  . insulin NPH (HUMULIN N,NOVOLIN N) 100 UNIT/ML injection Inject 10-20 Units into the skin 2 (two) times daily. Takes 20 units in the morning and 10 units in the evening.      . Insulin Syringe-Needle U-100 (ELITE-THIN INS SYR .5CC/31G) 31G X 5/16" 0.5 ML MISC 1 each by Does not apply route 3 (three) times daily. Use to inject insulin daily  100 each  11  . Lancets 30G MISC Use to test blood glucose 2-3 times daily dx code 250.00  100 each  11  . potassium chloride (MICRO-K) 10 MEQ CR capsule Take 1 capsule (10 mEq total) by mouth 2 (two) times daily.  60 capsule  0  . pravastatin (PRAVACHOL) 80 MG tablet Take 1 tablet (80 mg total) by mouth daily.  30 tablet  6  . warfarin (COUMADIN) 5 MG tablet Take 2.5-5 mg by mouth daily. 2.5mg  Monday, Wednesday, and Friday.  5 mg the  rest of the week       Scheduled:  . [START ON 07/15/2013] allopurinol  100 mg Oral Daily  . aspirin EC  81 mg Oral Daily  . diltiazem  90 mg Oral BID  . sodium chloride  3 mL Intravenous Q12H    Assessment: 67 yo who was recently here and discharge. He has been on coumadin for afib. INR is 1.52 today. Coumadin is to be continued here.   PTA = 5mg  qday except 2.5mg  MWF?  Goal of Therapy:  INR 2-3 Monitor platelets by anticoagulation protocol: Yes   Plan:   Coumadin 2.5mg  PO x1 to total 7.5mg  today Daily INR  Onnie Boer, PharmD Pager: [phone] 07/14/2013 7:24 PM

## 2013-07-15 ENCOUNTER — Other Ambulatory Visit: Payer: Self-pay

## 2013-07-15 LAB — BASIC METABOLIC PANEL
BUN: 22 mg/dL (ref 6–23)
CO2: 25 mEq/L (ref 19–32)
Calcium: 8.8 mg/dL (ref 8.4–10.5)
Creatinine, Ser: 1.86 mg/dL — ABNORMAL HIGH (ref 0.50–1.35)
GFR calc Af Amer: 42 mL/min — ABNORMAL LOW (ref >90)
Glucose, Bld: 120 mg/dL — ABNORMAL HIGH (ref 70–99)
Potassium: 3.8 mEq/L (ref 3.5–5.1)
Sodium: 140 mEq/L (ref 135–145)

## 2013-07-15 LAB — PROTIME-INR
INR: 1.67 — ABNORMAL HIGH (ref 0.00–1.49)
Prothrombin Time: 19.2 seconds — ABNORMAL HIGH (ref 11.6–15.2)

## 2013-07-15 LAB — GLUCOSE, CAPILLARY
Glucose-Capillary: 147 mg/dL — ABNORMAL HIGH (ref 70–99)
Glucose-Capillary: 204 mg/dL — ABNORMAL HIGH (ref 70–99)
Glucose-Capillary: 188 mg/dL — ABNORMAL HIGH (ref 70–99)

## 2013-07-15 LAB — TROPONIN I: Troponin I: <0.3 ng/mL (ref <0.30)

## 2013-07-15 MED ORDER — WARFARIN SODIUM 7.5 MG PO TABS
7.5000 mg | ORAL_TABLET | Freq: Once | ORAL | Status: AC
Start: 2013-07-15 — End: 2013-07-15
  Administered 2013-07-15: 7.5 mg via ORAL
  Filled 2013-07-15: qty 1

## 2013-07-15 MED ORDER — ACETAMINOPHEN 325 MG PO TABS
650.0000 mg | ORAL_TABLET | Freq: Four times a day (QID) | ORAL | Status: DC | PRN
  Administered 2013-07-15 – 2013-07-16 (×2): 650 mg via ORAL
  Filled 2013-07-15: qty 2

## 2013-07-15 MED ORDER — DILTIAZEM HCL 90 MG PO TABS
45.0000 mg | ORAL_TABLET | Freq: Two times a day (BID) | ORAL | Status: DC
  Administered 2013-07-15 – 2013-07-17 (×5): 45 mg via ORAL
  Filled 2013-07-15 (×8): qty 0.5

## 2013-07-15 MED ORDER — CLONIDINE HCL 0.1 MG PO TABS
0.1000 mg | ORAL_TABLET | Freq: Two times a day (BID) | ORAL | Status: DC
  Administered 2013-07-15: 0.1 mg via ORAL
  Filled 2013-07-15 (×3): qty 1

## 2013-07-15 NOTE — Patient Instructions (Signed)
Admitted for observation.

## 2013-07-15 NOTE — Progress Notes (Signed)
Pt received Diltazem 45mg  PO at 1608( BP 190/101) on hour later BP 178/100 ; Two hour later BP: 196/87. MD notified. Will continue to monitor.

## 2013-07-15 NOTE — Progress Notes (Addendum)
Received referral for [facility] Care Management services. Patient declined services in the past. At initial bedside visit this morning, wife and patient declined services. After speaking with MD via phone, met with patient and wife again to discuss confusion around medications. Mrs Filardi reports she does not really have any confusion about medications. States "we picked up the medications that were sent to pharmacy at last discharge". States she will be agreeable to routine phone call to discuss medications and discharge instructions from [place] Management does not feel like patient needs home visits. Consents were signed. Confirmed contact number. Of note, Mrs Partida states they get prescriptions filled at [facility] on [address]. Made inpatient RNCM aware that [place] Management will follow.  Marthenia Rolling, MSN-Ed, RN,BSN- [facility] [phone]

## 2013-07-15 NOTE — Progress Notes (Signed)
ANTICOAGULATION CONSULT NOTE - Follow-up  Pharmacy Consult for coumadin Indication: atrial fibrillation  No Known Allergies  Patient Measurements: Weight: 208 lb 4.8 oz (94.484 kg)  Vital Signs: Temp: 98.7 F (37.1 C) (12/17 0536) Temp src: Oral (12/17 0536) BP: 182/100 mmHg (12/17 0536) Pulse Rate: 58 (12/17 0536)  Labs:  Recent Labs  07/14/13 0100 07/14/13 1825 07/15/13 0415 07/15/13 0422  HGB  --  10.8*  --   --   HCT  --  33.2*  --   --   PLT  --  216  --   --   LABPROT  --  17.9*  --  19.2*  INR  --  1.52*  --  1.67*  CREATININE  --  1.88*  --  1.86*  TROPONINI <0.30  --  <0.30  --     The CrCl is unknown because both a height and weight (above a minimum accepted value) are required for this calculation.  Assessment: 47 yom on chronic coumadin for afib, presented to the hospital with bradycardia. Pt continues on coumadin but INR remains low at 1.86. No new CBC today, no bleeding noted.   Goal of Therapy:  INR 2-3   Plan:  1. Repeat warfarin 7.5mg  PO x 1 tonight 2. F/u AM INR  Salome Arnt, PharmD, BCPS Pager # [phone] 07/15/2013 10:43 AM

## 2013-07-15 NOTE — Progress Notes (Signed)
UR Completed Shela Esses Graves-Bigelow, RN,BSN [phone]  

## 2013-07-15 NOTE — Progress Notes (Addendum)
Subjective:  Pt has no new complaints this AM.  Wife is at the bedside and does most of the talking for the patient (pt has h/o CVA).  I discussed with her that I felt [facility] services would be a great help to them and may prevent future readmission for Mr. Getachew.  There seems to be an issue of medication compliance in the past and I am afraid that Mrs. Zilliox is unaware of what medications her husband should be taking.  I stressed to her that she really needs to be vigilant as taking medications improperly can be very dangerous and have bad outcomes.  I encouraged her to try [facility] [place] services again. Apparently she had tried [facility] in the past and indicated that she didn't think it was necessary.  Pt denies any pain, fever/chills, CP, SOB, or N/V/D/C.    Of note, NT walked pt in the hall and HR went up to 130-140.    Objective:  Vital signs in last 24 hours: Filed Vitals:   07/15/13 0536 07/15/13 1147 07/15/13 1235 07/15/13 1351  BP: 182/100 211/101 187/112 190/102  Pulse: 58 76 93   Temp: 98.7 F (37.1 C) 98.2 F (36.8 C)    TempSrc: Oral Oral    Resp: 20 18    Weight: 94.484 kg (208 lb 4.8 oz)     SpO2: 98% 98%     Weight change:   Intake/Output Summary (Last 24 hours) at 07/15/13 1603 Last data filed at 07/15/13 1149  Gross per 24 hour  Intake    240 ml  Output   2050 ml  Net  -1810 ml    Physical Exam: Constitutional: Vital signs reviewed.  Patient is in no acute distress and cooperative with exam.   Head: Normocephalic and atraumatic Eyes: PERRL, EOMI, conjunctivae normal, no scleral icterus  Neck: Supple, Trachea midline Cardiovascular: RR irregular rhythm, S1, S2 present, no MRG, no peripheral edema noted Pulmonary/Chest: normal respiratory effort, CTAB, no wheezes, rales, or rhonchi Abdominal: Soft. +BS, Non-tender, non-distended Musculoskeletal: No joint deformities, erythema, or stiffness noted Neurological: A&O x3, cranial nerve II-XII are grossly intact, moving all  extremities  Skin: Warm, dry and intact. No rash Psychiatric: Normal mood and affect  Lab Results:  BMP:  Recent Labs Lab 07/14/13 1825 07/15/13 0422  NA 137 140  K 4.2 3.8  CL 105 107  CO2 22 25  GLUCOSE 139* 120*  BUN 24* 22  CREATININE 1.88* 1.86*  CALCIUM 8.6 8.8  MG 2.0  --   PHOS 2.6  --     CBC:  Recent Labs Lab 07/14/13 1825  WBC 4.2  HGB 10.8*  HCT 33.2*  MCV 81.2  PLT 216    Coagulation:  Recent Labs Lab 07/14/13 1825 07/15/13 0422  LABPROT 17.9* 19.2*  INR 1.52* 1.67*    CBG:            Recent Labs Lab 07/14/13 1632 07/14/13 2101 07/15/13 0007 07/15/13 0404 07/15/13 0725 07/15/13 1105  GLUCAP 133* 156* 277* 121* 85 204*           LFTs:  Recent Labs Lab 07/14/13 1825  AST 24  ALT 25  ALKPHOS 111  BILITOT 0.4  PROT 6.5  ALBUMIN 3.4*   Cardiac Enzymes:  Recent Labs Lab 07/14/13 0100 07/15/13 0415 07/15/13 1145  TROPONINI <0.30 <0.30 <0.30   Lab Results  Component Value Date   CKTOTAL 118 11/23/2010   CKMB 2.5 11/23/2010   TROPONINI <0.30 07/15/2013  Studies/Results: No results found.  Medications:  Scheduled Meds: . allopurinol  100 mg Oral Daily  . aspirin EC  81 mg Oral Daily  . diltiazem  45 mg Oral Q12H  . hydrALAZINE  50 mg Oral Q6H  . insulin aspart  0-9 Units Subcutaneous Q4H  . insulin glargine  10 Units Subcutaneous QHS  . pravastatin  80 mg Oral q1800  . sodium chloride  3 mL Intravenous Q12H  . warfarin  7.5 mg Oral ONCE-1800  . Warfarin - Pharmacist Dosing Inpatient   Does not apply q1800   Continuous Infusions:  PRN Meds:.hydrALAZINE  Antibiotics: Anti-infectives   None     Antibiotics Given (last 72 hours)   None      Day of Hospitalization:  1  Assessment/Plan: Active Problems:   Bradycardia   #Bradycardia- Resolved.  Pt pulse had been in the 30s since admission.  EKG showed a-fib, with slow vent response.  Pt is completely asymptomatic- no dizziness, no hypotension.  He  is with normal mental status. Will manage conservatively for now. Of note, pt had been instructed on last discharge to take diltiazem 30mg  twice daily but was taking his previous dose of 90mg  twice daily.  Cardiology was consulted but no further recommendations.  Cycled troponins x 3 are negative.  EKG this AM shows no acute changes.  HR this AM was up to 76.   -avoid meds that can lower heart rate -add diltiazem 45mg  twice daily; check BP 1 hour after restarting and then recheck again in 2 hours; if BP still elevated add 0.1mg  clonidine bid -TTE pending  Chronic Atrial Fibrillation- Home meds are coumadin and cardizem 90mg  BID. Will hold cardizem on admission, considering bradycardia.  -restart diltiazem 45mg  twice daily  HTN- Home medsClonidine- 0.3mg  tab BID. Initially held due to bradycardia.  Pt BP is elevated 211/101. -add diltiazem 45mg  twice daily; check BP 1 hour after restarting and then recheck again in 2 hours; if BP still elevated add 0.1mg  clonidine bid  DMII- Home meds- NPH insulin- 20u -morning, 10u at night. Last HBA1c- 07/06/2013- 9.1.  - SSI-S  - CBGs ac and hs   HLD- Home meds- pravastatin- 80mg  dly.   Lab Results  Component Value Date   CHOL 139 02/12/2013   HDL 47 02/12/2013   LDLCALC 68 02/12/2013   TRIG 118 02/12/2013   CHOLHDL 3.0 02/12/2013   -simvastatin which is on hosp formulary- 40mg  daily.   CAD- Stable. h/o non-obstructive CAD.  Appears stable, no chest pain. Home Meds- Aspirin 81mg  daily, cont pravastatin- 80mg  daily.   Dispo- Pt should be able to be discharged tomorrow with close follow-up since his bradycardia has resolved.  [facility] is also following and should help with medication compliance.     LOS: 1 day   Michail Jewels, MD 07/15/2013, 4:03 PM

## 2013-07-15 NOTE — Care Management Note (Unsigned)
    Page 1 of 1   07/15/2013     2:12:20 PM   CARE MANAGEMENT NOTE 07/15/2013  Patient:  Patrick [name], Patrick [name]   Account Number:  [id number]  Date Initiated:  07/15/2013  Documentation initiated by:  GRAVES-BIGELOW,Herald Vallin  Subjective/Objective Assessment:   Pt admitted for bradycardia. Pt is from home with wife.     Action/Plan:   [facility] was consulted and pt and wife are refusing  [facility] services at this time. No needs from CM at this time.   Anticipated DC Date:  07/17/2013   Anticipated DC Plan:  [place]  CM consult      Choice offered to / List presented to:             Status of service:  In process, will continue to follow Medicare Important Message given?   (If response is "NO", the following Medicare IM given date fields will be blank) Date Medicare IM given:   Date Additional Medicare IM given:    Discharge Disposition:    Per UR Regulation:  Reviewed for med. necessity/level of care/duration of stay  If discussed at [place] of Stay Meetings, dates discussed:    Comments:  07-15-13 [place], RN,BSN [phone] [place] did go back in and discussed with family the benefits of [facility] and they are agreeable to services. No further needs from CM at this time.

## 2013-07-15 NOTE — H&P (Signed)
Internal Medicine Attending Admission Note Date: 07/15/2013  Patient name: Patrick [name] Medical record number: [id number] Date of birth: [birth year] Age: 67 y.o. Gender: male  I saw and evaluated the patient. I reviewed the resident's note and I agree with the resident's findings and plan as documented in the resident's note, with the following additional comments.  Chief Complaint(s): Bradycardia  History - key components related to admission: Patient is a 68 year old man with history of coronary artery disease, atrial fibrillation, hyperlipidemia, chronic kidney disease, hypertension, type 2 diabetes mellitus, and other problems as outlined in the medical history, admitted with bradycardia noted during a clinic visit on 07/14/2013.  Patient was recently hospitalized with orthostatic hypotension and discharged 07/02/2013; at that time, it was intended that his diltiazem dose be reduced from 90 mg twice a day to 30 mg twice a day, but due to miscommunication patient had continued taking 90 mg twice a day.  He denies any dizziness, shortness of breath, or other symptoms.   Physical Exam - key components related to admission:  Filed Vitals:   07/15/13 0536 07/15/13 1147 07/15/13 1235 07/15/13 1351  BP: 182/100 211/101 187/112 190/102  Pulse: 58 76 93   Temp: 98.7 F (37.1 C) 98.2 F (36.8 C)    TempSrc: Oral Oral    Resp: 20 18    Weight: 208 lb 4.8 oz (94.484 kg)     SpO2: 98% 98%     General: Alert, no distress Lungs: Clear Heart: Irregularly irregular; no extra sounds Abdomen: Bowel sounds present, soft, nontender Shoney's: 2+ bilateral pedal edema  Lab results:   Basic Metabolic Panel:  Recent Labs  07/14/13 1825 07/15/13 0422  NA 137 140  K 4.2 3.8  CL 105 107  CO2 22 25  GLUCOSE 139* 120*  BUN 24* 22  CREATININE 1.88* 1.86*  CALCIUM 8.6 8.8  MG 2.0  --   PHOS 2.6  --     Liver Function Tests:  Recent Labs  07/14/13 1825  AST 24  ALT 25  ALKPHOS 111   BILITOT 0.4  PROT 6.5  ALBUMIN 3.4*     CBC:  Recent Labs  07/14/13 1825  WBC 4.2  HGB 10.8*  HCT 33.2*  MCV 81.2  PLT 216     Cardiac Enzymes:  Recent Labs  07/14/13 0100 07/15/13 0415 07/15/13 1145  TROPONINI <0.30 <0.30 <0.30      CBG:  Recent Labs  07/14/13 1632 07/14/13 2101 07/15/13 0007 07/15/13 0404 07/15/13 0725 07/15/13 1105  GLUCAP 133* 156* 277* 121* 85 204*    lCoagulation:  Recent Labs  07/14/13 1825 07/15/13 0422  INR 1.52* 1.67*    Other results: EKG 12/16: Atrial fibrillation, ventricular rate 36; T-inversion in 1 and aVL EKG 12/17: Atrial fibrillation, ventricular rate 61   Assessment & Plan by Problem:  1.  Bradycardia.  Patient presented with marked bradycardia which was asymptomatic.  His diltiazem and clonidine were held, and his heart rate has improved to normal rate at rest, with increased rate above 130 when walking.  The plan is to resume diltiazem at half of his previous dose (45 mg twice a day) and monitor heart rate at rest and with ambulation.  If bradycardia recurs, further reductions in the diltiazem may be needed; if acceptable rate control is compromised by bradycardia at rest, then will need further input from cardiology.  2.  Hypertension.  As above, will resume a lower dose of diltiazem for rate control.  Would then carefully  add back clonidine as indicated by his blood pressure and as heart rate allows.  3.  Other problems and plans as per the resident physician's note.

## 2013-07-16 LAB — GLUCOSE, CAPILLARY
Glucose-Capillary: 122 mg/dL — ABNORMAL HIGH (ref 70–99)
Glucose-Capillary: 210 mg/dL — ABNORMAL HIGH (ref 70–99)
Glucose-Capillary: 185 mg/dL — ABNORMAL HIGH (ref 70–99)
Glucose-Capillary: 167 mg/dL — ABNORMAL HIGH (ref 70–99)

## 2013-07-16 LAB — PROTIME-INR: INR: 1.9 — ABNORMAL HIGH (ref 0.00–1.49)

## 2013-07-16 MED ORDER — FUROSEMIDE 20 MG PO TABS
20.0000 mg | ORAL_TABLET | Freq: Every day | ORAL | Status: DC
  Administered 2013-07-16 – 2013-07-18 (×3): 20 mg via ORAL
  Filled 2013-07-16 (×3): qty 1

## 2013-07-16 MED ORDER — CLONIDINE HCL 0.1 MG PO TABS
0.1000 mg | ORAL_TABLET | ORAL | Status: AC
  Administered 2013-07-16: 0.1 mg via ORAL
  Filled 2013-07-16: qty 1

## 2013-07-16 MED ORDER — WARFARIN SODIUM 5 MG PO TABS
5.0000 mg | ORAL_TABLET | Freq: Once | ORAL | Status: AC
  Administered 2013-07-16: 5 mg via ORAL
  Filled 2013-07-16: qty 1

## 2013-07-16 MED ORDER — CLONIDINE HCL 0.2 MG PO TABS
0.2000 mg | ORAL_TABLET | Freq: Two times a day (BID) | ORAL | Status: DC
  Administered 2013-07-16: 0.2 mg via ORAL
  Filled 2013-07-16 (×2): qty 1

## 2013-07-16 MED ORDER — CLONIDINE HCL 0.3 MG PO TABS
0.3000 mg | ORAL_TABLET | Freq: Two times a day (BID) | ORAL | Status: DC
  Administered 2013-07-16 – 2013-07-18 (×4): 0.3 mg via ORAL
  Filled 2013-07-16 (×5): qty 1

## 2013-07-16 NOTE — Progress Notes (Signed)
Internal Medicine Attending  Date: 07/16/2013  Patient name: Patrick [name] Medical record number: [id number] Date of birth: 19-Apr-1946 Age: 67 y.o. Gender: male  I saw and evaluated the patient and discussed his care on a.m. rounds with house staff. I reviewed the resident's note by Dr. Gordy Levan and I agree with the resident's findings and plans as documented in her note.

## 2013-07-16 NOTE — Progress Notes (Signed)
ANTICOAGULATION CONSULT NOTE - Follow-up  Pharmacy Consult for coumadin Indication: atrial fibrillation  No Known Allergies  Patient Measurements: Weight: 204 lb 12.8 oz (92.897 kg)  Vital Signs: Temp: 98.7 F (37.1 C) (12/18 0529) Temp src: Oral (12/18 0529) BP: 160/112 mmHg (12/18 0529) Pulse Rate: 91 (12/18 0529)  Labs:  Recent Labs  07/14/13 0100 07/14/13 1825 07/15/13 0415 07/15/13 0422 07/15/13 1145 07/16/13 0615  HGB  --  10.8*  --   --   --   --   HCT  --  33.2*  --   --   --   --   PLT  --  216  --   --   --   --   LABPROT  --  17.9*  --  19.2*  --  21.2*  INR  --  1.52*  --  1.67*  --  1.90*  CREATININE  --  1.88*  --  1.86*  --   --   TROPONINI <0.30  --  <0.30  --  <0.30  --     The CrCl is unknown because both a height and weight (above a minimum accepted value) are required for this calculation.  Assessment: 8 yom on chronic coumadin for afib, presented to the hospital with bradycardia. Pt continues on coumadin but INR remains low at 1.9. However it is starting to trend up with boosted dose. No new CBC today, no bleeding noted.   Goal of Therapy:  INR 2-3   Plan:  1. Warfarin 5mg  PO x 1 tonight - if discharged would continue 5mg  daily with INR check early next week 2. F/u AM INR  Salome Arnt, PharmD, BCPS Pager # [phone] 07/16/2013 8:40 AM

## 2013-07-16 NOTE — Progress Notes (Signed)
Subjective:  Pt's family is at the bedside.  Pt denies any CP, SOB, N/V/D/C, or lightheadedness.  He reports having a frontal HA this AM which he does not usually get.  He has been eating and drinking OK.  He denies any other complaints.   Wife voices concerns about pt taking so many medications.  [facility] will monitor medication compliance.   Objective:  Vital signs in last 24 hours: Filed Vitals:   07/15/13 2230 07/16/13 0015 07/16/13 0529 07/16/13 1023  BP: 203/94 186/85 160/112 191/93  Pulse:   91   Temp:   98.7 F (37.1 C)   TempSrc:   Oral   Resp:   18   Weight:   92.897 kg (204 lb 12.8 oz)   SpO2:   96%    Weight change: -1.588 kg (-3 lb 8 oz)  Intake/Output Summary (Last 24 hours) at 07/16/13 1118 Last data filed at 07/16/13 0900  Gross per 24 hour  Intake    120 ml  Output   1700 ml  Net  -1580 ml    Physical Exam: Constitutional: Vital signs reviewed.  Patient is in no acute distress and cooperative with exam.   Head: Normocephalic and atraumatic Eyes: PERRL, EOMI, conjunctivae normal, no scleral icterus  Neck: Supple, Trachea midline Cardiovascular: RR irregular rhythm, S1, S2 present, no MRG, 1+ pedal edema b/l >L than R Pulmonary/Chest: normal respiratory effort, CTAB, no wheezes, rales, or rhonchi Abdominal: Soft. +BS, Non-tender, non-distended Musculoskeletal: No joint deformities, erythema, or stiffness noted Neurological: A&O x3, cranial nerve II-XII are grossly intact, moving all extremities  Skin: Warm, dry and intact. No rash Psychiatric: Normal mood and affect  Lab Results:  BMP:  Recent Labs Lab 07/14/13 1825 07/15/13 0422  NA 137 140  K 4.2 3.8  CL 105 107  CO2 22 25  GLUCOSE 139* 120*  BUN 24* 22  CREATININE 1.88* 1.86*  CALCIUM 8.6 8.8  MG 2.0  --   PHOS 2.6  --     CBC:  Recent Labs Lab 07/14/13 1825  WBC 4.2  HGB 10.8*  HCT 33.2*  MCV 81.2  PLT 216    Coagulation:  Recent Labs Lab 07/14/13 1825 07/15/13 0422  07/16/13 0615  LABPROT 17.9* 19.2* 21.2*  INR 1.52* 1.67* 1.90*    CBG:            Recent Labs Lab 07/15/13 0404 07/15/13 0725 07/15/13 1105 07/15/13 1624 07/15/13 2057 07/16/13 0714  GLUCAP 121* 85 204* 113* 188* 122*           LFTs:  Recent Labs Lab 07/14/13 1825  AST 24  ALT 25  ALKPHOS 111  BILITOT 0.4  PROT 6.5  ALBUMIN 3.4*   Cardiac Enzymes:  Recent Labs Lab 07/14/13 0100 07/15/13 0415 07/15/13 1145  TROPONINI <0.30 <0.30 <0.30   Lab Results  Component Value Date   CKTOTAL 118 11/23/2010   CKMB 2.5 11/23/2010   TROPONINI <0.30 07/15/2013    Studies/Results: No results found.  Medications:  Scheduled Meds: . allopurinol  100 mg Oral Daily  . aspirin EC  81 mg Oral Daily  . cloNIDine  0.1 mg Oral NOW  . cloNIDine  0.3 mg Oral BID  . diltiazem  45 mg Oral Q12H  . hydrALAZINE  50 mg Oral Q6H  . insulin aspart  0-9 Units Subcutaneous Q4H  . insulin glargine  10 Units Subcutaneous QHS  . pravastatin  80 mg Oral q1800  . sodium chloride  3 mL Intravenous Q12H  . warfarin  5 mg Oral ONCE-1800  . Warfarin - Pharmacist Dosing Inpatient   Does not apply q1800   Continuous Infusions:  PRN Meds:.acetaminophen, hydrALAZINE  Antibiotics: Anti-infectives   None     Antibiotics Given (last 72 hours)   None      Day of Hospitalization:  2  Assessment/Plan: Active Problems:   Bradycardia  #Bradycardia- Resolved.  Pt pulse had been in the 30s since admission.  EKG showed a-fib, with slow vent response.  Pt is completely asymptomatic- no dizziness, no hypotension.  He is with normal mental status. Will manage conservatively for now. Of note, pt had been instructed on last discharge to take diltiazem 30mg  twice daily but was taking his previous dose of 90mg  twice daily.  Cardiology was consulted but no further recommendations.  Cycled troponins x 3 are negative.  EKG shows no acute changes.  HR this AM was 91.    -avoid meds that can lower heart  rate -continue diltiazem 45mg  twice daily -TTE pending (called should be done today)  Chronic Atrial Fibrillation- Home meds are coumadin and cardizem 90mg  BID. Will hold cardizem on admission, considering bradycardia.  Restarted diltiazem yesterday.  INR is subtherapeutic at 1.90. -continue diltiazem 45mg  twice daily -walk pt today to see if HR increases with ambulation -pharmacy recommends 5mg  coumadin tonight and then 5mg  coumadin daily upon discharge with INR check next week  HTN- Home meds Clonidine- 0.3mg  tab BID. Initially held due to bradycardia.  Pt BP is elevated 211/101.  Added clonidine 0.1mg  yesterday bid and BP is still elevated.  Will increase to home dose of 0.3mg  bid today.  -continue diltiazem 45mg  twice daily -home dose of clonidine 0.3mg  added today (received 0.2mg  this AM-will give 0.1mg  now to total up to his home dose; recheck BP in 2 hours -may need to add back a small dose of lasix given b/l LE edema  DMII- Home meds- NPH insulin- 20u -morning, 10u at night. Last HBA1c- 07/06/2013- 9.1.  CBGs OK.  - SSI-S  - CBGs ac and hs   HLD- Home meds- pravastatin- 80mg  dly.   Lab Results  Component Value Date   CHOL 139 02/12/2013   HDL 47 02/12/2013   LDLCALC 68 02/12/2013   TRIG 118 02/12/2013   CHOLHDL 3.0 02/12/2013   -simvastatin which is on hosp formulary- 40mg  daily.   CAD- Stable. h/o non-obstructive CAD.  Appears stable, no chest pain. Home Meds- Aspirin 81mg  daily, cont pravastatin- 80mg  daily.   Dispo- Pt should be able to be discharged possible later today if BP better controlled with home regimen and close follow-up since his bradycardia has resolved.  [facility] is also following and should help with medication compliance.     LOS: 2 days   Michail Jewels, MD 07/16/2013, 11:18 AM

## 2013-07-16 NOTE — Progress Notes (Signed)
I saw and evaluated the patient.  I personally confirmed the key portions of the history and exam documented by Dr. Komanski and I reviewed pertinent patient test results.  The assessment, diagnosis, and plan were formulated together and I agree with the documentation in the resident's note.  

## 2013-07-16 NOTE — Progress Notes (Signed)
Echocardiogram 2D Echocardiogram has been performed.  Joelene Millin 07/16/2013, 11:17 AM

## 2013-07-16 NOTE — Progress Notes (Signed)
Chaplain responded to spiritual care consult. Chaplain visited with pt and pt's wife at bedside. Pt requested prayer. Chaplain provided emotional and spiritual support, caring presence, and empathic listening. Pt and his wife was very grateful for support.   Ethelene Browns, [place]

## 2013-07-16 NOTE — Progress Notes (Signed)
Heart rate at rest : 76 beat per min, while ambulating heart rate stayed between 88 to 105 beats per min.  Ferdinand Lango, RN

## 2013-07-17 ENCOUNTER — Encounter (HOSPITAL_COMMUNITY): Payer: Self-pay | Admitting: Cardiology

## 2013-07-17 DIAGNOSIS — I4821 Permanent atrial fibrillation: Secondary | ICD-10-CM

## 2013-07-17 DIAGNOSIS — I495 Sick sinus syndrome: Secondary | ICD-10-CM | POA: Diagnosis present

## 2013-07-17 DIAGNOSIS — Z7901 Long term (current) use of anticoagulants: Secondary | ICD-10-CM

## 2013-07-17 DIAGNOSIS — G4730 Sleep apnea, unspecified: Secondary | ICD-10-CM

## 2013-07-17 HISTORY — DX: Sick sinus syndrome: I49.5

## 2013-07-17 HISTORY — DX: Permanent atrial fibrillation: I48.21

## 2013-07-17 HISTORY — DX: Long term (current) use of anticoagulants: Z79.01

## 2013-07-17 LAB — GLUCOSE, CAPILLARY
Glucose-Capillary: 249 mg/dL — ABNORMAL HIGH (ref 70–99)
Glucose-Capillary: 228 mg/dL — ABNORMAL HIGH (ref 70–99)

## 2013-07-17 LAB — PROTIME-INR: Prothrombin Time: 21.8 seconds — ABNORMAL HIGH (ref 11.6–15.2)

## 2013-07-17 MED ORDER — DILTIAZEM HCL 30 MG PO TABS
30.0000 mg | ORAL_TABLET | Freq: Three times a day (TID) | ORAL | Status: DC
  Administered 2013-07-17 – 2013-07-18 (×2): 30 mg via ORAL
  Filled 2013-07-17 (×5): qty 1

## 2013-07-17 MED ORDER — WARFARIN SODIUM 5 MG PO TABS
5.0000 mg | ORAL_TABLET | Freq: Once | ORAL | Status: AC
  Administered 2013-07-17: 5 mg via ORAL
  Filled 2013-07-17: qty 1

## 2013-07-17 MED ORDER — HYDRALAZINE HCL 50 MG PO TABS
100.0000 mg | ORAL_TABLET | Freq: Three times a day (TID) | ORAL | Status: DC
  Administered 2013-07-17 – 2013-07-18 (×2): 100 mg via ORAL
  Filled 2013-07-17 (×5): qty 2

## 2013-07-17 NOTE — Progress Notes (Signed)
Blood pressure 161/99, heart rate 80.

## 2013-07-17 NOTE — Progress Notes (Signed)
Pts heart rate trend has started to come down more so than it has with some pauses that are about 2.2 second. Pt is asymptomatic but I paged Dr. Gordy Levan to let her be made aware

## 2013-07-17 NOTE — Progress Notes (Signed)
ANTICOAGULATION CONSULT NOTE - Follow-up  Pharmacy Consult for coumadin Indication: atrial fibrillation  No Known Allergies  Patient Measurements: Weight: 202 lb 9.6 oz (91.899 kg)  Vital Signs: Temp: 98.9 F (37.2 C) (12/19 0622) Temp src: Oral (12/19 0622) BP: 176/93 mmHg (12/19 0622) Pulse Rate: 77 (12/19 0622)  Labs:  Recent Labs  07/14/13 1825 07/15/13 0415 07/15/13 0422 07/15/13 1145 07/16/13 0615 07/17/13 0610  HGB 10.8*  --   --   --   --   --   HCT 33.2*  --   --   --   --   --   PLT 216  --   --   --   --   --   LABPROT 17.9*  --  19.2*  --  21.2* 21.8*  INR 1.52*  --  1.67*  --  1.90* 1.97*  CREATININE 1.88*  --  1.86*  --   --   --   TROPONINI  --  <0.30  --  <0.30  --   --     The CrCl is unknown because both a height and weight (above a minimum accepted value) are required for this calculation.  Assessment: 80 yom on chronic coumadin for afib, presented to the hospital with bradycardia. Pt continues on coumadin and INR is nearly therapeutic today at 1.97. No new CBC today, no bleeding noted.   Goal of Therapy:  INR 2-3   Plan:  1. Repeat Warfarin 5mg  PO x 1 tonight - if discharged would continue 5mg  daily with INR check early next week 2. F/u AM INR  Salome Arnt, PharmD, BCPS Pager # [phone] 07/17/2013 8:45 AM

## 2013-07-17 NOTE — Consult Note (Signed)
Reason for Consult: tachy brady syndrome now with increased bradycardia, PAF   Referring Physician:  Dr. Gordy Levan [id number], Harrell Gave, MD Primary Cardiologist:New Dr. Donivan Scull  Patrick [name] is an 67 y.o. male.    Chief Complaint: no complaints found to be bradycardic HR in the 20-30s. on OV.  Pt admitted 07/14/13  HPI: 67 year old AAMM with hx of atrial fib as far as I can tell from at least 2012.  HR in 2012 was 45, meds have been adjusted at times and he has done well in atrial fib.  He is on coumadin.  On OV he was taking 90 mg BID of cardizem but was supposed to be taking 30 mg BID.  Pt was asymptomatic without dizziness, chest pain or SOB.  Though on admit wife thought he was a little SOB.   Here he has both brady and tach arrythmia.  At time HR up to 124, other times in the 40s.    Pt has been seen by cardiology remotely, His wife could not remember who but it had been a long time.  He does have a history of Heart cath 04/06/04 by Dr. Lia Foyer  With non obstructive disease 30% LAD, 40% 2nd diag, and RCA with 30-40% stenosis.  Pt had presented then mild bump in troponin with chest pain.   Echo this admit:  Left ventricle: The cavity size was normal. There was mild focal basal and severe concentric hypertrophy of the septum. Systolic function was vigorous. The estimated ejection fraction was in the range of 65% to 70%. Wall motion was normal; there were no regional wall motion abnormalities. Doppler parameters are consistent with a reversible restrictive pattern, indicative of decreased left ventricular diastolic compliance and/or increased left atrial pressure (grade 3 diastolic dysfunction). - Aortic valve: Mild regurgitation. - Aorta: The aorta was moderately dilated. - Mitral valve: Moderately calcified annulus. - Left atrium: The atrium was severely dilated. Last nuc study I see is 2004 prior to the cath.  Currently pt without complaints. His cardizem is now 45 mg  BID, he is on clonidine for BP.  Additionally pt with OSA and uses Cpap but does not have here.   No chest pain no SOB now.  Past Medical History  Diagnosis Date  . Hypertension   . CKD (chronic kidney disease), stage II   . CAD (coronary artery disease)   . Depression   . Gout   . Hyperlipidemia   . 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 GERD (gastroesophageal reflux disease)   . Diabetes mellitus type II     Insulin dependent  . Upper GI bleeding 07/01/2013  . Paroxysmal atrial fibrillation     on coumadin  . Atrial fibrillation   . Myocardial infarction 2000's    "near heart attack" (07/14/2013)  . Shortness of breath     "can happen at any time" (07/14/2013)  . Sleep apnea 07/2010    "has mask at home; seldom uses it" (07/14/2013)  . Stomach ulcer 1950's    "as a teenager"  . Stroke ~ 2007; ~ 2009    "memory not as good since" (07/14/2013)  . Permanent atrial fibrillation 07/17/2013  . Chronic anticoagulation, on coumadin 07/17/2013  . Tachy-brady syndrome 07/17/2013    Past Surgical History  Procedure Laterality Date  . Cardiac catheterization  07/2003    /encounter notes 08/27/2005 (07/01/2013)    History reviewed.  No pertinent family history. Unknown cause of parents deaths.  No family hx. Of CAD Social History:  reports that he quit smoking about 36 years ago. His smoking use included Cigarettes. He has a 7.5 pack-year smoking history. He has quit using smokeless tobacco. He reports that he drinks alcohol. He reports that he does not use illicit drugs. Married, retired from [employer] in [place]: No Known Allergies  Medications Prior to Admission  Medication Sig Dispense Refill  . allopurinol (ZYLOPRIM) 100 MG tablet Take 200 mg by mouth daily.      Marland Kitchen aspirin 81 MG EC tablet Take 81 mg by mouth daily.        . Blood Glucose Monitoring Suppl (ACCU-CHEK AVIVA PLUS) W/DEVICE KIT 1 each by Does not apply  route as needed.  1 kit  0  . cloNIDine (CATAPRES) 0.3 MG tablet Take 1 tablet (0.3 mg total) by mouth 2 (two) times daily.  180 tablet  1  . diltiazem (CARDIZEM) 90 MG tablet Take 1 tablet (90 mg total) by mouth 2 (two) times daily.  180 tablet  2  . glipiZIDE (GLUCOTROL XL) 10 MG 24 hr tablet Take 10 mg by mouth daily with breakfast.      . glucose blood (TRUETRACK TEST) test strip Use to test blood sugar 2-3 times daily dx code 250.00  100 each  11  . insulin NPH (HUMULIN N,NOVOLIN N) 100 UNIT/ML injection Inject 10-20 Units into the skin 2 (two) times daily. Takes 20 units in the morning and 10 units in the evening.      . Insulin Syringe-Needle U-100 (ELITE-THIN INS SYR .5CC/31G) 31G X 5/16" 0.5 ML MISC 1 each by Does not apply route 3 (three) times daily. Use to inject insulin daily  100 each  11  . Lancets 30G MISC Use to test blood glucose 2-3 times daily dx code 250.00  100 each  11  . potassium chloride (MICRO-K) 10 MEQ CR capsule Take 1 capsule (10 mEq total) by mouth 2 (two) times daily.  60 capsule  0  . pravastatin (PRAVACHOL) 80 MG tablet Take 1 tablet (80 mg total) by mouth daily.  30 tablet  6  . warfarin (COUMADIN) 5 MG tablet Take 2.5-5 mg by mouth daily. 2.5mg  Monday, Wednesday, and Friday.  5 mg the rest of the week        Results for orders placed during the hospital encounter of 07/14/13 (from the past 48 hour(s))  GLUCOSE, CAPILLARY     Status: Abnormal   Collection Time    07/15/13  8:57 PM      Result Value Range   Glucose-Capillary 188 (*) 70 - 99 mg/dL   Comment 1 Notify RN    PROTIME-INR     Status: Abnormal   Collection Time    07/16/13  6:15 AM      Result Value Range   Prothrombin Time 21.2 (*) 11.6 - 15.2 seconds   INR 1.90 (*) 0.00 - 1.49  GLUCOSE, CAPILLARY     Status: Abnormal   Collection Time    07/16/13  7:14 AM      Result Value Range   Glucose-Capillary 122 (*) 70 - 99 mg/dL   Comment 1 Notify RN     Comment 2 Documented in Chart    GLUCOSE,  CAPILLARY     Status: Abnormal   Collection Time    07/16/13 11:27 AM      Result Value Range  Glucose-Capillary 210 (*) 70 - 99 mg/dL   Comment 1 Notify RN     Comment 2 Documented in Chart    GLUCOSE, CAPILLARY     Status: Abnormal   Collection Time    07/16/13  4:32 PM      Result Value Range   Glucose-Capillary 185 (*) 70 - 99 mg/dL   Comment 1 Notify RN     Comment 2 Documented in Chart    GLUCOSE, CAPILLARY     Status: Abnormal   Collection Time    07/16/13  9:08 PM      Result Value Range   Glucose-Capillary 167 (*) 70 - 99 mg/dL  PROTIME-INR     Status: Abnormal   Collection Time    07/17/13  6:10 AM      Result Value Range   Prothrombin Time 21.8 (*) 11.6 - 15.2 seconds   INR 1.97 (*) 0.00 - 1.49  GLUCOSE, CAPILLARY     Status: Abnormal   Collection Time    07/17/13  7:47 AM      Result Value Range   Glucose-Capillary 141 (*) 70 - 99 mg/dL   Comment 1 Documented in Chart     Comment 2 Notify RN    GLUCOSE, CAPILLARY     Status: Abnormal   Collection Time    07/17/13 11:51 AM      Result Value Range   Glucose-Capillary 249 (*) 70 - 99 mg/dL   Comment 1 Documented in Chart     Comment 2 Notify RN    GLUCOSE, CAPILLARY     Status: Abnormal   Collection Time    07/17/13  3:48 PM      Result Value Range   Glucose-Capillary 228 (*) 70 - 99 mg/dL   Comment 1 Documented in Chart     Comment 2 Notify RN     No results found.  ROS: General:no colds or fevers, no weight changes Skin:no rashes or ulcers HEENT:no blurred vision, no congestion CV:see HPI PUL:see HPI GI:no diarrhea constipation or melena, no indigestion GU:no hematuria, no dysuria MS:no joint pain, no claudication Neuro:no syncope, no lightheadedness Endo:+ diabetes, no thyroid disease   Blood pressure 169/61, pulse 72, temperature 98.8 F (37.1 C), temperature source Oral, resp. rate 16, weight 202 lb 9.6 oz (91.899 kg), SpO2 100.00%. PE: General:Pleasant affect but very flat, NAD, answers  questions but slow to respond Skin:Warm and dry, brisk capillary refill HEENT:normocephalic, sclera clear, mucus membranes moist Neck:supple, no JVD, no bruits  Heart:irreg irreg without murmur, gallup, rub or click Lungs:clear without rales, rhonchi, or wheezes [id number], non tender, + BS, do not palpate liver spleen or masses Ext:no lower ext edema, 2+ pedal pulses, 2+ radial pulses Neuro:alert and oriented, MAE, follows commands, + facial symmetry    Assessment/Plan Principal Problem:   Bradycardia Active Problems:   Type II diabetes mellitus,  uncontrolled   DEPRESSION   CORONARY ARTERY DISEASE, non obstructive disease in 2005 with cath   SLEEP APNEA   Permanent atrial fibrillation   Chronic anticoagulation, on coumadin   Tachy-brady syndrome  PLAN:  Difficult situation, pt now with tachy brady syndrome, meds to control HR cause bradycardia.  He is asymptomatic and on lower dose of cardizem less bradycardic.  It would be helpful to have him wear his c pap in hospital to prevent nighttime bradycardia.  I briefly discussed pacemaker with pt and his wife.  MD to see for further eval.  [facility]  Nurse Practitioner  Certified [place] Pager [phone] or after 5pm or weekends call [phone] 07/17/2013, 4:40 PM    I have seen and examined the patient along with Cecilie Kicks, PA.  I have reviewed the chart, notes and new data.  I agree with NP's note.  Key new complaints: none Key examination changes: his heart rate was very fast in the morning, until he received his 10 AM dose of diltiazem, after which it has been 60-65 at rest and maximum 100 with exercise. Key new findings / data: INR 1.9  PLAN: I think that the short acting pharmacokinetics of diltiazem are part of the problem. Reschedule as TID.  Increase hydralazine dose since his BP is high - will make this drug TID as well to simplfy timing of meds. I do not think he needs a pacemaker at this  time, but this may be a future need.  Sanda Klein, MD, [place] [phone] 07/17/2013, 6:02 PM

## 2013-07-17 NOTE — Progress Notes (Signed)
Subjective:  [name] is at the bedside.  Pt denies any CP, SOB, N/V/D/C, or lightheadedness.  He denies any other complaints.    Objective:  Vital signs in last 24 hours: Filed Vitals:   07/16/13 2110 07/16/13 2335 07/17/13 0622 07/17/13 1244  BP: 160/86 172/83 176/93 161/99  Pulse: 58  77 80  Temp: 98.6 F (37 C)  98.9 F (37.2 C)   TempSrc: Oral  Oral   Resp:      Weight:   91.899 kg (202 lb 9.6 oz)   SpO2: 99%  96%    Weight change: -0.998 kg (-2 lb 3.2 oz)  Intake/Output Summary (Last 24 hours) at 07/17/13 1301 Last data filed at 07/16/13 1700  Gross per 24 hour  Intake    180 ml  Output    250 ml  Net    -70 ml    Physical Exam: Constitutional: Vital signs reviewed.  Patient is in no acute distress and cooperative with exam.   Head: Normocephalic and atraumatic Eyes: PERRL, EOMI, conjunctivae normal, no scleral icterus  Neck: Supple, Trachea midline Cardiovascular: RR irregular rhythm, S1, S2 present, no MRG, minimal pedal edema b/l Pulmonary/Chest: normal respiratory effort, CTAB, no wheezes, rales, or rhonchi Abdominal: Soft. +BS, Non-tender, non-distended Musculoskeletal: No joint deformities, erythema, or stiffness noted Neurological: A&O x3, cranial nerve II-XII are grossly intact, moving all extremities  Skin: Warm, dry and intact. No rash Psychiatric: Normal mood and affect  Lab Results:  BMP:  Recent Labs Lab 07/14/13 1825 07/15/13 0422  NA 137 140  K 4.2 3.8  CL 105 107  CO2 22 25  GLUCOSE 139* 120*  BUN 24* 22  CREATININE 1.88* 1.86*  CALCIUM 8.6 8.8  MG 2.0  --   PHOS 2.6  --     CBC:  Recent Labs Lab 07/14/13 1825  WBC 4.2  HGB 10.8*  HCT 33.2*  MCV 81.2  PLT 216    Coagulation:  Recent Labs Lab 07/14/13 1825 07/15/13 0422 07/16/13 0615 07/17/13 0610  LABPROT 17.9* 19.2* 21.2* 21.8*  INR 1.52* 1.67* 1.90* 1.97*    CBG:            Recent Labs Lab 07/16/13 0714 07/16/13 1127 07/16/13 1632 07/16/13 2108  07/17/13 0747 07/17/13 1151  GLUCAP 122* 210* 185* 167* 141* 249*           LFTs:  Recent Labs Lab 07/14/13 1825  AST 24  ALT 25  ALKPHOS 111  BILITOT 0.4  PROT 6.5  ALBUMIN 3.4*   Cardiac Enzymes:  Recent Labs Lab 07/14/13 0100 07/15/13 0415 07/15/13 1145  TROPONINI <0.30 <0.30 <0.30   Lab Results  Component Value Date   CKTOTAL 118 11/23/2010   CKMB 2.5 11/23/2010   TROPONINI <0.30 07/15/2013    Studies/Results: Transthoracic Echocardiography Study Conclusions: - Left ventricle: The cavity size was normal. There was mild focal basal and severe concentric hypertrophy of the septum. Systolic function was vigorous. The estimated ejection fraction was in the range of 65% to 70%. Wall motion was normal; there were no regional wall motion abnormalities. Doppler parameters are consistent with a reversible restrictive pattern, indicative of decreased left ventricular diastolic compliance and/or increased left atrial pressure (grade 3 diastolic dysfunction). - Aortic valve: Mild regurgitation. - Aorta: The aorta was moderately dilated. - Mitral valve: Moderately calcified annulus. - Left atrium: The atrium was severely dilated  Medications:  Scheduled Meds: . allopurinol  100 mg Oral Daily  . aspirin EC  81  mg Oral Daily  . cloNIDine  0.3 mg Oral BID  . diltiazem  45 mg Oral Q12H  . furosemide  20 mg Oral Daily  . hydrALAZINE  50 mg Oral Q6H  . insulin aspart  0-9 Units Subcutaneous Q4H  . insulin glargine  10 Units Subcutaneous QHS  . pravastatin  80 mg Oral q1800  . sodium chloride  3 mL Intravenous Q12H  . warfarin  5 mg Oral ONCE-1800  . Warfarin - Pharmacist Dosing Inpatient   Does not apply q1800   Continuous Infusions:  PRN Meds:.acetaminophen, hydrALAZINE  Antibiotics: Anti-infectives   None     Antibiotics Given (last 72 hours)   None      Day of Hospitalization:  3  Assessment/Plan: Active Problems:   Bradycardia  Bradycardia-  Resolved.  Pt pulse had been in the 30s since admission.  EKG showed a-fib, with slow vent response.  Pt is completely asymptomatic- no dizziness, no hypotension.  He is with normal mental status. Will manage conservatively for now. Of note, pt had been instructed on last discharge to take diltiazem 30mg  twice daily but was taking his previous dose of 90mg  twice daily.  Cardiology was consulted but no further recommendations.  Cycled troponins x 3 are negative.  EKG shows no acute changes.  HR this AM was 77.  - continue diltiazem 45mg  twice daily -TTE reveals normal systolic function with grade 3 diastolic dysfunction  Chronic Atrial Fibrillation- Home meds are coumadin and cardizem 90mg  BID. Will hold cardizem on admission, considering bradycardia.  Restarted diltiazem.    INR is subtherapeutic at 1.97.   07/16/13: At rest, 76 beat per min, while ambulating HR between 88 to 105 bpm.   -continue diltiazem 45mg  twice daily -pharmacy recommends 5mg  coumadin tonight and then 5mg  coumadin daily upon -discharge with INR check early next week  HTN- Home meds Clonidine- 0.3mg  tab BID. Initially held due to bradycardia.  Pt BP is elevated 211/101.  Added clonidine 0.1mg  yesterday bid and BP is still elevated.  Will increase to home dose of 0.3mg  bid today.  -continue diltiazem 45mg  twice daily -home dose of clonidine 0.3mg  added yesterday -home dose of lasix 20mg  restarted  DMII- Home meds- NPH insulin- 20u -morning, 10u at night. Last HBA1c- 07/06/2013- 9.1.  CBGs OK.  - SSI-S  - CBGs ac and hs   HLD- Home meds- pravastatin- 80mg  dly.   Lab Results  Component Value Date   CHOL 139 02/12/2013   HDL 47 02/12/2013   LDLCALC 68 02/12/2013   TRIG 118 02/12/2013   CHOLHDL 3.0 02/12/2013   -simvastatin which is on hosp formulary- 40mg  daily.   CAD- Stable. h/o non-obstructive CAD.  Appears stable, no chest pain. Home Meds- Aspirin 81mg  daily, cont pravastatin- 80mg  daily.   Dispo- Pt should be able to  be discharged today if BP better controlled with home regimen and close follow-up since his bradycardia has resolved.  [facility] is also following and should help with medication compliance.     LOS: 3 days   Michail Jewels, MD 07/17/2013, 1:01 PM

## 2013-07-17 NOTE — Progress Notes (Signed)
Internal Medicine Attending  Date: 07/17/2013  Patient name: Patrick [name] Medical record number: [id number] Date of birth: 05-Mar-1946 Age: 67 y.o. Gender: male  I saw and evaluated the patient on a.m. rounds with house staff. I reviewed the resident's note by Dr. Gordy Levan and I agree with the resident's findings and plans as documented in her note.

## 2013-07-17 NOTE — Discharge Summary (Signed)
Internal [facility] Discharge Note  Name: Patrick [name] MRN: [id number] DOB: 17-Sep-1945 67 y.o.  Date of Admission: 07/14/2013  4:26 PM Date of Discharge: 07/18/2013 Attending Physician: Bertha Stakes, MD  Discharge Diagnosis: Principal Problem:   Tachy-brady syndrome Active Problems:   Type II diabetes mellitus,  uncontrolled   HYPERLIPIDEMIA   Morbid obesity   DEPRESSION   HYPERTENSION   CORONARY ARTERY DISEASE, non obstructive disease in 2005 with cath   Chronic kidney disease (CKD), stage III (moderate)   SLEEP APNEA   Bradycardia   Permanent atrial fibrillation   Chronic anticoagulation, on coumadin   Discharge Medications:   Medication List         ACCU-CHEK AVIVA PLUS W/DEVICE Kit  1 each by Does not apply route as needed.     allopurinol 100 MG tablet  Commonly known as:  ZYLOPRIM  Take 200 mg by mouth daily.     aspirin 81 MG EC tablet  Take 81 mg by mouth daily.     cloNIDine 0.3 MG tablet  Commonly known as:  CATAPRES  Take 1 tablet (0.3 mg total) by mouth 2 (two) times daily.     diltiazem 30 MG tablet  Commonly known as:  CARDIZEM  Take 1 tablet (30 mg total) by mouth 3 (three) times daily.     furosemide 20 MG tablet  Commonly known as:  LASIX  Take 1 tablet (20 mg total) by mouth daily.     glipiZIDE 10 MG 24 hr tablet  Commonly known as:  GLUCOTROL XL  Take 10 mg by mouth daily with breakfast.     glucose blood test strip  Commonly known as:  TRUETRACK TEST  Use to test blood sugar 2-3 times daily dx code 250.00     hydrALAZINE 100 MG tablet  Commonly known as:  APRESOLINE  Take 1 tablet (100 mg total) by mouth every 8 (eight) hours.     insulin NPH 100 UNIT/ML injection  Commonly known as:  HUMULIN N,NOVOLIN N  Inject 10-20 Units into the skin 2 (two) times daily. Takes 20 units in the morning and 10 units in the evening.     Insulin Syringe-Needle U-100 31G X 5/16" 0.5 ML Misc  Commonly known as:  ELITE-THIN  INS SYR .5CC/31G  1 each by Does not apply route 3 (three) times daily. Use to inject insulin daily     Lancets 30G Misc  Use to test blood glucose 2-3 times daily dx code 250.00     lisinopril 5 MG tablet  Commonly known as:  PRINIVIL,ZESTRIL  Take 1 tablet (5 mg total) by mouth daily.     potassium chloride 10 MEQ CR capsule  Commonly known as:  MICRO-K  Take 1 capsule (10 mEq total) by mouth 2 (two) times daily.     pravastatin 80 MG tablet  Commonly known as:  PRAVACHOL  Take 1 tablet (80 mg total) by mouth daily.     warfarin 5 MG tablet  Commonly known as:  COUMADIN  Take 0.5-1 tablets (2.5-5 mg total) by mouth daily. 2.5mg  Monday and Friday.  5 mg the rest of the week        Disposition and follow-up:   [name] was discharged from [facility] in Good condition.  At the hospital follow up visit please address:  1. Please check patient's vital signs specifically, the pulse rate, and adjust her medications accordingly. 2. BMP for creatinine and potassium level. May  need to increase his lisinopril dose if blood pressure is elevated. 3. Please encourage patient to comply with his CPAP 4. Please encourage patient to followup with cardiology. 5. Labs / imaging needed at time of follow-up: BMP 6. Pending labs/ test needing follow-up: none  Follow-up Appointments:     Follow-up Information   Follow up with Caryl Bis, [facility] On 07/20/2013. (9:30AM)    Specialty:  Pharmacist   Contact information:   [place] [address] [phone]       Follow up with Marrion Coy, MD On 08/10/2013. (8:15AM)    Specialty:  Internal Medicine   Contact information:   [place] [address] [phone]       Follow up with Dane. Call in 2 days.   Contact information:   [address] [address] [phone]      Follow up with Erlene Quan, PA-C. (office will  call you)    Specialty:  Cardiology   Contact information:   [address] [place] [address] [phone]       Follow up with Marrion Coy, MD On 08/10/2013. (8:15am)    Specialty:  Internal Medicine   Contact information:   [place] [address] [phone]      Discharge Orders   Future Appointments Provider Department Dept Phone   07/28/2013 2:00 PM Ilean China [facility] [id number]   08/10/2013 8:15 AM Marrion Coy, MD [place] [phone]   Future Orders Complete By Expires   Call MD for:  persistant dizziness or light-headedness  As directed    Call MD for:  persistant nausea and vomiting  As directed    Call MD for:  severe uncontrolled pain  As directed    Diet - low sodium heart healthy  As directed    Increase activity slowly  As directed       Consultations: Treatment Team:  Rounding Lbcardiology, MD Sanda Klein, MD  Procedures Performed:  Dg Chest 2 View  06/24/2013   CLINICAL DATA:  Hematemesis.  EXAM: CHEST  2 VIEW  COMPARISON:  11/22/2010  FINDINGS: Mild cardiomegaly is stable. Both lungs are clear. The visualized skeletal structures are unremarkable.  IMPRESSION: Mild cardiomegaly.  No active lung disease.   Electronically Signed   By: Earle Gell M.D.   On: 06/24/2013 22:30    2D Echo: 07/16/2013 Study Conclusions  - Left ventricle: The cavity size was normal. There was mild focal basal and severe concentric hypertrophy of the septum. Systolic function was vigorous. The estimated ejection fraction was in the range of 65% to 70%. Wall motion was normal; there were no regional wall motion abnormalities. Doppler parameters are consistent with a reversible restrictive pattern, indicative of decreased left ventricular diastolic compliance and/or increased left atrial pressure (grade 3 diastolic dysfunction). - Aortic valve: Mild  regurgitation. - Aorta: The aorta was moderately dilated. - Mitral valve: Moderately calcified annulus. - Left atrium: The atrium was severely dilated.  Admission HPI:   Chief Complaint: Bradycardia.   History of Present Illness: [name] is a 25 y o male with PMH - depression, CAD, OSA- has a CPAp but dosent use it, Paroxysmal A fib, HLD, CKD3, HTN, DM2, Gout.  Presented today for his routine clinic follow up. He was then found to be bradycardic- pulse in the 20-30s with elevated blood pressure- Systolic- [id number] and  was therefore admitted. No complainst of dizziness, lightheadedness or falls. Wife says pt is a bit SOB, but on further questioning, husband denies this says there is no change in the number of pillows he uses, and no change in the distance he works before he becomes SOB. But wife does say that pt wife is abit more swollen than usual. Pt is on dialtizem for Atria fib, and was taking 90mg  BID instead of 30mg  Po BID, but as per chat pt was told to cont at this dose. Pt is also on clonidine- 0.3mg  BID. Pt and his wife say that he takes his meds accordingly. But cannot tell exactly what dose of meds pt is taking.  Review of Systems:  CONSTITUTIONAL- No Fever, weightloss, night sweat,or change in appetite.  SKIN- No Rash, colour changes,or itching.  HEAD- No Headache, or dizziness.  Mouth/throat- No Sorethroat, dentures, bleeding gums.  RESPIRATORY- No Cough,or SOB.  CARDIAC- No Palpitations, DOE, PND, or chest pain.  GI- no vomiting, diarrhoea, constipation,or abd pain.  URINARY- No Frequency, or dysuria  NEUROLOGIC- Numbness, syncope, burning.   Physical Exam:  Blood pressure 169/87, pulse 30, temperature 97.6 F (36.4 C), temperature source Oral, SpO2 100.00%.  GENERAL- alert, co-operative, appears as stated age, not in any distress.  HEENT- Atraumatic, normocephalic, PERRL, EOMI, oral mucosa appears moist, No cervical LN enlargement, thyroid does not appear enlarged.   CARDIAC- Bradycardic, irregular , no murmurs, rubs or gallops.  RESP- Moving equal volumes of air, and clear to auscultation bilaterally, no crackles, or rales.  ABDOMEN- Soft, nontender, no palpable masses or organomegaly, bowel sounds present.  BACK- Normal curvature of the spine, No tenderness along the vertebrae, no CVA tenderness.  NEURO- No obvious Cr N 2-12 abnormality, strenght equal and present in all extremities.  EXTREMITIES- pulse 2+, symmetric, 2+ pitting pedal edema.  SKIN- Warm, dry, No rash or lesion.  PSYCH- Normal mood and affect, appropriate thought content and speech.   Hospital Course by problem list: Principal Problem:   Tachy-brady syndrome Active Problems:   Type II diabetes mellitus,  uncontrolled   HYPERLIPIDEMIA   Morbid obesity   DEPRESSION   HYPERTENSION   CORONARY ARTERY DISEASE, non obstructive disease in 2005 with cath   Chronic kidney disease (CKD), stage III (moderate)   SLEEP APNEA   Bradycardia   Permanent atrial fibrillation   Chronic anticoagulation, on coumadin    # Tachybradycardia syndrome in the setting of Atrial fibrillation: Patient's pulse was in there ranges 30s-40s on admission. EKG showed a-fib, with slow vent response. He was completely asymptomatic- no dizziness, no hypotension, no shortness of breath. He remained hemodynamically stable throughout his hospitalization, and, therefore, conservative management for his tachy-brady syndrome was pursued. Of note, patient had been instructed on last discharge to take diltiazem 30mg  twice daily but was taking a higher dose of 90mg  twice daily. Cycled troponins x 3 were negative. EKG shows no acute changes for cardiac ischemia. 2D echocardiogram revealed normal systolic function with grade 3 diastolic dysfunction. On admission, his home Cardizem was held and his heart rate slowly improved to the 70s-80s. However, when he was ambulated, he would become tachycardic in the ranges of 140s. At that time  cardiology (Dr Dani Gobble) was consulted. He recommended that due to the short acting pharmacokinetics of diltiazem, the medication should be restarted at an 8 hourly dose of 30 mg.  He also recommended to increase hydralazine dose since his BP was high. This would help to simplify timing of both  medications. He felt that a pacemaker was not required, but this may be a future need. The patient was discharged on Cardizem 30 mg 3 times a day.  We will avoid any other beta blockade medications. He was also encouraged to use his CPAP machine nightly as this might help with tachybradycardia syndrome. Patient will followup with cardiology as outpatient on 07/28/2013. In terms of his anticoagulation, patient was discharged on his home Coumadin dose with instructions to followup with Dr. Elie Confer on 07/20/2013. INR will be checked and further Coumadin dose adjustment made as needed.  # Hypertension:  Patient's home meds include p.o Clonidine 0.3mg  BID. This was Initially held due to bradycardia , but during his hospital stay. His blood pressure rebounded to highs of 200s/100s. We gradually restarted and increased clonidine to a dose of 0.3 mg bid. Hydralazine 100 mg 3 times a day was added in addition to lisinopril 5 mg as recommended by cardiology. He was also instructed to take his home Lasix of 20 mg daily. During his outpatient followup visit at [facility], lisinopril, if renal function permits, can be increased to achieve optimal BP control.   No other changes were made on his other home medications.  Discharge Vitals:  BP 140/90  Pulse 70  Temp(Src) 97.5 F (36.4 C) (Oral)  Resp 18  Wt 201 lb 6 oz (91.344 kg)  SpO2 100%  Discharge Labs: No results found for this or any previous visit (from the past 24 hour(s)).  Signed: Jessee Avers 07/21/2013, 2:24 PM   Time Spent on Discharge: 38 minutes Services Ordered on Discharge: none Equipment Ordered on Discharge: none

## 2013-07-17 NOTE — Progress Notes (Signed)
RT placed patient on cpap auto mode max 12cmH20 and min 5cmH20 via full face mask on room air. Patient did not know home settings and told RT that this setting felt right for him. RT will continue to monitor.

## 2013-07-18 DIAGNOSIS — Z7901 Long term (current) use of anticoagulants: Secondary | ICD-10-CM

## 2013-07-18 LAB — PROTIME-INR
INR: 2.31 — ABNORMAL HIGH (ref 0.00–1.49)
Prothrombin Time: 24.6 seconds — ABNORMAL HIGH (ref 11.6–15.2)

## 2013-07-18 LAB — GLUCOSE, CAPILLARY
Glucose-Capillary: 92 mg/dL (ref 70–99)
Glucose-Capillary: 209 mg/dL — ABNORMAL HIGH (ref 70–99)

## 2013-07-18 MED ORDER — LISINOPRIL 5 MG PO TABS
5.0000 mg | ORAL_TABLET | Freq: Every day | ORAL | Status: DC
  Filled 2013-07-18: qty 1

## 2013-07-18 MED ORDER — WARFARIN SODIUM 5 MG PO TABS
5.0000 mg | ORAL_TABLET | Freq: Once | ORAL | Status: DC
  Filled 2013-07-18: qty 1

## 2013-07-18 MED ORDER — HYDRALAZINE HCL 100 MG PO TABS: 100.0000 mg | ORAL_TABLET | Freq: Three times a day (TID) | ORAL | Status: DC

## 2013-07-18 MED ORDER — LISINOPRIL 5 MG PO TABS: 5.0000 mg | ORAL_TABLET | Freq: Every day | ORAL | Status: DC

## 2013-07-18 MED ORDER — WARFARIN SODIUM 5 MG PO TABS: 2.5000 mg | ORAL_TABLET | Freq: Every day | ORAL | Status: DC

## 2013-07-18 MED ORDER — FUROSEMIDE 20 MG PO TABS: 20.0000 mg | ORAL_TABLET | Freq: Every day | ORAL | Status: DC

## 2013-07-18 MED ORDER — DILTIAZEM HCL 30 MG PO TABS: 90.0000 mg | ORAL_TABLET | Freq: Three times a day (TID) | ORAL | Status: DC

## 2013-07-18 MED ORDER — LISINOPRIL 5 MG PO TABS
5.0000 mg | ORAL_TABLET | Freq: Every day | ORAL | Status: DC
  Administered 2013-07-18: 5 mg via ORAL
  Filled 2013-07-18: qty 1

## 2013-07-18 NOTE — Progress Notes (Signed)
Subjective:  Patient feeling better today.   Objective:  Vital signs in last 24 hours: Filed Vitals:   07/17/13 2028 07/18/13 0430 07/18/13 0956 07/18/13 1232  BP: 188/78 175/93 195/99 140/90  Pulse: 77 65  70  Temp: 99.1 F (37.3 C) 97.5 F (36.4 C)    TempSrc: Oral Oral    Resp: 18 18    Weight:  201 lb 6 oz (91.344 kg)    SpO2: 100% 100%     Weight change: -1 lb 3.6 oz (-0.555 kg)  Intake/Output Summary (Last 24 hours) at 07/18/13 2039 Last data filed at 07/18/13 0431  Gross per 24 hour  Intake      0 ml  Output    300 ml  Net   -300 ml    Physical Exam: Constitutional: Vital signs reviewed.  Patient is in no acute distress and cooperative with exam.  Two sons at bedside. Head: Normocephalic and atraumatic Eyes: PERRL, EOMI, conjunctivae normal, no scleral icterus  Neck: Supple, Trachea midline Cardiovascular: RR irregular rhythm, S1, S2 present, no MRG, minimal pedal edema b/l Pulmonary/Chest: normal respiratory effort, CTAB, no wheezes, rales, or rhonchi Abdominal: Soft. +BS, Non-tender, non-distended Musculoskeletal: No joint deformities, erythema, or stiffness noted Neurological: A&O x3, cranial nerve II-XII are grossly intact, moving all extremities  Skin: Warm, dry and intact. No rash Psychiatric: Normal mood and affect  Lab Results:  BMP:  Recent Labs Lab 07/14/13 1825 07/15/13 0422  NA 137 140  K 4.2 3.8  CL 105 107  CO2 22 25  GLUCOSE 139* 120*  BUN 24* 22  CREATININE 1.88* 1.86*  CALCIUM 8.6 8.8  MG 2.0  --   PHOS 2.6  --     CBC:  Recent Labs Lab 07/14/13 1825  WBC 4.2  HGB 10.8*  HCT 33.2*  MCV 81.2  PLT 216    Coagulation:  Recent Labs Lab 07/15/13 0422 07/16/13 0615 07/17/13 0610 07/18/13 0354  LABPROT 19.2* 21.2* 21.8* 24.6*  INR 1.67* 1.90* 1.97* 2.31*    CBG:            Recent Labs Lab 07/17/13 1151 07/17/13 1548 07/17/13 2014 07/18/13 0426 07/18/13 0742 07/18/13 1155  GLUCAP 249* 228* 222* 122* 92  209*           LFTs:  Recent Labs Lab 07/14/13 1825  AST 24  ALT 25  ALKPHOS 111  BILITOT 0.4  PROT 6.5  ALBUMIN 3.4*   Cardiac Enzymes:  Recent Labs Lab 07/14/13 0100 07/15/13 0415 07/15/13 1145  TROPONINI <0.30 <0.30 <0.30   Lab Results  Component Value Date   CKTOTAL 118 11/23/2010   CKMB 2.5 11/23/2010   TROPONINI <0.30 07/15/2013    Studies/Results: Transthoracic Echocardiography Study Conclusions: - Left ventricle: The cavity size was normal. There was mild focal basal and severe concentric hypertrophy of the septum. Systolic function was vigorous. The estimated ejection fraction was in the range of 65% to 70%. Wall motion was normal; there were no regional wall motion abnormalities. Doppler parameters are consistent with a reversible restrictive pattern, indicative of decreased left ventricular diastolic compliance and/or increased left atrial pressure (grade 3 diastolic dysfunction). - Aortic valve: Mild regurgitation. - Aorta: The aorta was moderately dilated. - Mitral valve: Moderately calcified annulus. - Left atrium: The atrium was severely dilated  Medications:  Scheduled Meds:  Continuous Infusions: PRN Meds:.    Antibiotics: Anti-infectives   None     Antibiotics Given (last 72 hours)   None  Day of Hospitalization:  4  Assessment/Plan: Principal Problem:   Bradycardia Active Problems:   Type II diabetes mellitus,  uncontrolled   HYPERLIPIDEMIA   Morbid obesity   DEPRESSION   HYPERTENSION   CORONARY ARTERY DISEASE, non obstructive disease in 2005 with cath   Chronic kidney disease (CKD), stage III (moderate)   SLEEP APNEA   Permanent atrial fibrillation   Chronic anticoagulation, on coumadin   Tachy-brady syndrome  Bradycardia- Resolved.  Pt pulse had been in the 30s since admission.  EKG showed a-fib, with slow vent response.  Pt is completely asymptomatic- no dizziness, no hypotension.  He was with normal mental  status and therefore managed conservatively. Of note, pt had been instructed on last discharge to take diltiazem 30mg  twice daily but was taking his previous dose of 90mg  twice daily. Cycled troponins x 3 are negative.  EKG shows no acute changes. TTE revealed normal systolic function with grade 3 diastolic dysfunction  Plan  - HR improved  - continue diltiazem 30mg  tid - for discharge today - appreciate cards input  Chronic Atrial Fibrillation- Home meds are coumadin and cardizem 90mg  BID. Reduced cardizem to 30 mg tid. Patient did well on this dosing as recommended by cards. Encouraged to use his CPAP machine more consistently. Cardiology will patient call for appt.   HTN- Improved. Home meds Clonidine- 0.3mg  tab BID. Initially held due to bradycardia.  Pt BP is elevated 211/101.  Gradually increased clonidine to home dose of 0.3 mg bid. Added lisinopril 5 mg as recommended by cards. Will follow up early this week at [facility] for further adjustment. Continue with  lasix 20mg  restarted  DMII- Home meds- NPH insulin- 20u -morning, 10u at night. Last HBA1c- 07/06/2013- 9.1.  CBGs OK.  - SSI-S  - CBGs ac and hs   HLD- Home meds- pravastatin- 80mg  dly.   Lab Results  Component Value Date   CHOL 139 02/12/2013   HDL 47 02/12/2013   LDLCALC 68 02/12/2013   TRIG 118 02/12/2013   CHOLHDL 3.0 02/12/2013   -simvastatin which is on hosp formulary- 40mg  daily.   CAD- Stable. h/o non-obstructive CAD.  Appears stable, no chest pain. Home Meds- Aspirin 81mg  daily, cont pravastatin- 80mg  daily.   Dispo- Pt should be able to be discharged today if BP better controlled with home regimen and close follow-up since his bradycardia has resolved.  [facility] is also following and should help with medication compliance.     LOS: 4 days   Jessee Avers, MD 07/18/2013, 8:39 PM

## 2013-07-18 NOTE — Progress Notes (Addendum)
Subjective:  No complaints  Objective:  Vital Signs in the last 24 hours: Temp:  [97.5 F (36.4 C)-99.1 F (37.3 C)] 97.5 F (36.4 C) (12/20 0430) Pulse Rate:  [65-80] 65 (12/20 0430) Resp:  [16-18] 18 (12/20 0430) BP: (161-188)/(61-99) 175/93 mmHg (12/20 0430) SpO2:  [100 %] 100 % (12/20 0430) Weight:  [201 lb 6 oz (91.344 kg)] 201 lb 6 oz (91.344 kg) (12/20 0430)  Intake/Output from previous day:  Intake/Output Summary (Last 24 hours) at 07/18/13 [id number] Last data filed at 07/18/13 0431  Gross per 24 hour  Intake    340 ml  Output   1400 ml  Net  -1060 ml    Physical Exam: General appearance: alert, cooperative, no distress and mildly obese Lungs: clear to auscultation bilaterally Heart: irregularly irregular rhythm and 2/6 systolic murmur   Rate: [id number]  Rhythm: atrial fibrillation  Lab Results: No results found for this basename: WBC, HGB, PLT,  in the last 72 hours No results found for this basename: NA, K, CL, CO2, GLUCOSE, BUN, CREATININE,  in the last 72 hours  Recent Labs  07/15/13 1145  TROPONINI <0.30    Recent Labs  07/18/13 0354  INR 2.31*    Imaging: Imaging results have been reviewed  Cardiac Studies:  Assessment/Plan:   Principal Problem:   Bradycardia Active Problems:   Tachy-brady syndrome   Type II diabetes mellitus,  uncontrolled   Chronic kidney disease (CKD), stage III (moderate)   Permanent atrial fibrillation   HYPERLIPIDEMIA   Morbid obesity   DEPRESSION   HYPERTENSION   CORONARY ARTERY DISEASE, non obstructive disease in 2005 with cath   SLEEP APNEA   Chronic anticoagulation, on coumadin    PLAN: Dr Sallyanne Kuster to see. He says he will be more compliant with C-pap at home. We are trying to avoid a pacemaker.   Kerin Ransom PA-C Beeper [id number] 07/18/2013, 9:22 AM   I have seen and examined the patient along with Kerin Ransom, PA.  I have reviewed the chart, notes and new data.  I agree with PA's note.  Key new  complaints: feels great Key examination changes: HR is well controlled (60s at rest, low 100s walking), but BP is high Key new findings / data: INR>2  PLAN: If primary team agrees. I think he can be discharged and we can adjust BP meds further as outpatient. Would recommend adding ACEi or ARB for BP, but will need to watch renal function and K closely. No need for pacemaker at this time. Avoid adding beta blockers or higher doses of diltiazem.  Sanda Klein, MD, [place] [phone] 07/18/2013, 10:35 AM

## 2013-07-18 NOTE — Progress Notes (Signed)
ANTICOAGULATION CONSULT NOTE - Follow-up  Pharmacy Consult for coumadin Indication: atrial fibrillation  No Known Allergies  Patient Measurements: Weight: 201 lb 6 oz (91.344 kg)  Vital Signs: Temp: 97.5 F (36.4 C) (12/20 0430) Temp src: Oral (12/20 0430) BP: 175/93 mmHg (12/20 0430) Pulse Rate: 65 (12/20 0430)  Labs:  Recent Labs  07/15/13 1145 07/16/13 0615 07/17/13 0610 07/18/13 0354  LABPROT  --  21.2* 21.8* 24.6*  INR  --  1.90* 1.97* 2.31*  TROPONINI <0.30  --   --   --     The CrCl is unknown because both a height and weight (above a minimum accepted value) are required for this calculation.  Assessment: 56 yom on chronic coumadin for afib, presented to the hospital with bradycardia. Pt continues on coumadin and INR is therapeutic today at 2.31. No new CBC today, no bleeding noted. All doses charted as given.  PTA coumadin dose: 2.5mg  MWF, 5mg  all other days  Coumadin education completed 12/17.   Goal of Therapy:  INR 2-3   Plan:  1. Repeat Warfarin 5mg  PO x 1 tonight - (PTA dosing schedule). If discharged would continue 5mg  daily with INR check early next week 2. F/u AM INR  Laniece Hornbaker C. Maribeth Jiles, PharmD Clinical Pharmacist-Resident Pager: [phone] Pharmacy: [phone] 07/18/2013 8:55 AM

## 2013-07-20 ENCOUNTER — Telehealth: Payer: Self-pay | Admitting: Internal Medicine

## 2013-07-20 ENCOUNTER — Encounter: Payer: Self-pay | Admitting: Pharmacist

## 2013-07-20 ENCOUNTER — Ambulatory Visit (INDEPENDENT_AMBULATORY_CARE_PROVIDER_SITE_OTHER): Payer: Medicare Other | Admitting: Pharmacist

## 2013-07-20 VITALS — BP 189/106 | HR 82

## 2013-07-20 DIAGNOSIS — I10 Essential (primary) hypertension: Secondary | ICD-10-CM | POA: Diagnosis not present

## 2013-07-20 DIAGNOSIS — Z7901 Long term (current) use of anticoagulants: Secondary | ICD-10-CM | POA: Diagnosis not present

## 2013-07-20 DIAGNOSIS — Z5181 Encounter for therapeutic drug level monitoring: Secondary | ICD-10-CM | POA: Diagnosis not present

## 2013-07-20 DIAGNOSIS — I4891 Unspecified atrial fibrillation: Secondary | ICD-10-CM | POA: Diagnosis not present

## 2013-07-20 DIAGNOSIS — E1165 Type 2 diabetes mellitus with hyperglycemia: Secondary | ICD-10-CM | POA: Diagnosis not present

## 2013-07-20 DIAGNOSIS — I63219 Cerebral infarction due to unspecified occlusion or stenosis of unspecified vertebral arteries: Secondary | ICD-10-CM

## 2013-07-20 DIAGNOSIS — N183 Chronic kidney disease, stage 3 unspecified: Secondary | ICD-10-CM | POA: Diagnosis not present

## 2013-07-20 DIAGNOSIS — I6350 Cerebral infarction due to unspecified occlusion or stenosis of unspecified cerebral artery: Secondary | ICD-10-CM

## 2013-07-20 DIAGNOSIS — E119 Type 2 diabetes mellitus without complications: Secondary | ICD-10-CM | POA: Diagnosis not present

## 2013-07-20 LAB — POCT INR: INR: 3

## 2013-07-20 MED ORDER — DILTIAZEM HCL 30 MG PO TABS: 30.0000 mg | ORAL_TABLET | Freq: Three times a day (TID) | ORAL | Status: DC

## 2013-07-20 NOTE — Progress Notes (Signed)
Indication: Atrial fibrillation. Duration: Lifelong. INR: At target. Agree with Dr. Gladstone Pih assessment and plan.

## 2013-07-20 NOTE — Patient Instructions (Signed)
Patient instructed to take medications as defined in the Anti-coagulation Track section of this encounter.  Patient instructed to take today's dose.  Patient verbalized understanding of these instructions.    

## 2013-07-20 NOTE — Progress Notes (Signed)
Anti-Coagulation Progress Note  Patrick [name] is a 67 y.o. male who is currently on an anti-coagulation regimen.    RECENT RESULTS: Recent results are below, the most recent result is correlated with a dose of 30 mg. per week: Lab Results  Component Value Date   INR 3.00 07/20/2013   INR 2.31* 07/18/2013   INR 1.97* 07/17/2013    ANTI-COAG DOSE: Anticoagulation Dose Instructions as of 07/20/2013     [name] Tue Wed Thu Fri Sat   New Dose 5 mg 2.5 mg 5 mg 2.5 mg 5 mg 2.5 mg 5 mg       ANTICOAG SUMMARY: Anticoagulation Episode Summary   Current INR goal 2.0-3.0  Next INR check 08/03/2013  INR from last check 3.00 (07/20/2013)  Weekly max dose   Target end date Indefinite  [facility] check [facility]  Preferred lab   Send INR reminders to    Indications  ATRIAL FIBRILLATION PAROXYSMAL CHRONIC (Resolved) [427.31] Acute ischemic VBA thalamic stroke [434.91] Chronic anticoagulation on coumadin [V58.61]        Comments         ANTICOAG TODAY: Anticoagulation Summary as of 07/20/2013   INR goal 2.0-3.0  Selected INR 3.00 (07/20/2013)  Next INR check 08/03/2013  Target end date Indefinite   Indications  ATRIAL FIBRILLATION PAROXYSMAL CHRONIC (Resolved) [427.31] Acute ischemic VBA thalamic stroke [434.91] Chronic anticoagulation on coumadin [V58.61]      Anticoagulation Episode Summary   [facility] check [facility]   Preferred lab    Send INR reminders to    Comments       PATIENT INSTRUCTIONS: Patient Instructions  Patient instructed to take medications as defined in the Anti-coagulation Track section of this encounter.  Patient instructed to take today's dose.  Patient verbalized understanding of these instructions.       FOLLOW-UP Return in 2 weeks (on 08/03/2013) for Follow up INR at 1000h.  [name], III Pharm.D., CACP

## 2013-07-20 NOTE — Telephone Encounter (Signed)
Called [name] and clarified about the dose of Cardizem. I instructed the patient to take cardizem 30 mg three times daily. Patient indicated that she is getting her medications on Wednesday 07/22/2013. She verbalized understanding.

## 2013-07-28 ENCOUNTER — Encounter: Payer: Self-pay | Admitting: Cardiology

## 2013-07-28 ENCOUNTER — Ambulatory Visit (INDEPENDENT_AMBULATORY_CARE_PROVIDER_SITE_OTHER): Payer: Medicare Other | Admitting: Cardiology

## 2013-07-28 VITALS — BP 140/80 | HR 80 | Ht 69.0 in | Wt 214.0 lb

## 2013-07-28 DIAGNOSIS — G4730 Sleep apnea, unspecified: Secondary | ICD-10-CM

## 2013-07-28 DIAGNOSIS — I6322 Cerebral infarction due to unspecified occlusion or stenosis of basilar arteries: Secondary | ICD-10-CM

## 2013-07-28 DIAGNOSIS — N183 Chronic kidney disease, stage 3 unspecified: Secondary | ICD-10-CM

## 2013-07-28 DIAGNOSIS — I495 Sick sinus syndrome: Secondary | ICD-10-CM

## 2013-07-28 DIAGNOSIS — I10 Essential (primary) hypertension: Secondary | ICD-10-CM

## 2013-07-28 DIAGNOSIS — I4821 Permanent atrial fibrillation: Secondary | ICD-10-CM

## 2013-07-28 DIAGNOSIS — I6350 Cerebral infarction due to unspecified occlusion or stenosis of unspecified cerebral artery: Secondary | ICD-10-CM | POA: Diagnosis not present

## 2013-07-28 DIAGNOSIS — I4891 Unspecified atrial fibrillation: Secondary | ICD-10-CM

## 2013-07-28 DIAGNOSIS — I63219 Cerebral infarction due to unspecified occlusion or stenosis of unspecified vertebral arteries: Secondary | ICD-10-CM

## 2013-07-28 DIAGNOSIS — E1165 Type 2 diabetes mellitus with hyperglycemia: Secondary | ICD-10-CM | POA: Diagnosis not present

## 2013-07-28 NOTE — Progress Notes (Signed)
07/28/2013 Patrick [name]   [birth year]  LC:[id number]  Primary Physicia Marrion Coy, MD Primary Cardiologist: Dr Sallyanne Kuster  HPI:  67 y/o male new to [name] Dec 2014 when we saw him in consult at [facility]. The pt has a history of a remote cath in 2005 showing mild to moderate CAD. He has AF, apparently dating back to 2012. He has had a thalamic CVA in 2012 with residual word finding deficit. When he presented in December he was SOB and his HR was 40-120 in AF.  An echo showed preserved LVF. His SCr was 1.8. We made recommendations regarding his medications. The plan was to avoid a pacemaker if possible. Fortunately he improved  With conservative Rx. He is seen in the office today for a post hospital check. His wife says he is doing well, no unusual SOB. He has been compliant with his C-pap at night. His B/P in the office is still a little elevated- 140/80- 180/90. With his AF its a little difficult to tell.    Current Outpatient Prescriptions  Medication Sig Dispense Refill  . allopurinol (ZYLOPRIM) 100 MG tablet Take 200 mg by mouth daily.      Marland Kitchen aspirin 81 MG EC tablet Take 81 mg by mouth daily.        . Blood Glucose Monitoring Suppl (ACCU-CHEK AVIVA PLUS) W/DEVICE KIT 1 each by Does not apply route as needed.  1 kit  0  . cloNIDine (CATAPRES) 0.3 MG tablet Take 1 tablet (0.3 mg total) by mouth 2 (two) times daily.  180 tablet  1  . diltiazem (CARDIZEM) 30 MG tablet Take 1 tablet (30 mg total) by mouth 3 (three) times daily.  180 tablet  2  . furosemide (LASIX) 20 MG tablet Take 1 tablet (20 mg total) by mouth daily.  30 tablet  0  . glipiZIDE (GLUCOTROL XL) 10 MG 24 hr tablet Take 10 mg by mouth daily with breakfast.      . glucose blood (TRUETRACK TEST) test strip Use to test blood sugar 2-3 times daily dx code 250.00  100 each  11  . hydrALAZINE (APRESOLINE) 100 MG tablet Take 1 tablet (100 mg total) by mouth every 8 (eight) hours.  90 tablet  0  . insulin NPH (HUMULIN N,NOVOLIN N) 100 UNIT/ML  injection Inject 10-20 Units into the skin 2 (two) times daily. Takes 20 units in the morning and 10 units in the evening.      . Insulin Syringe-Needle U-100 (ELITE-THIN INS SYR .5CC/31G) 31G X 5/16" 0.5 ML MISC 1 each by Does not apply route 3 (three) times daily. Use to inject insulin daily  100 each  11  . Lancets 30G MISC Use to test blood glucose 2-3 times daily dx code 250.00  100 each  11  . lisinopril (PRINIVIL,ZESTRIL) 5 MG tablet Take 1 tablet (5 mg total) by mouth daily.  30 tablet  0  . potassium chloride (MICRO-K) 10 MEQ CR capsule Take 1 capsule (10 mEq total) by mouth 2 (two) times daily.  60 capsule  0  . pravastatin (PRAVACHOL) 80 MG tablet Take 1 tablet (80 mg total) by mouth daily.  30 tablet  6  . warfarin (COUMADIN) 5 MG tablet Take 0.5-1 tablets (2.5-5 mg total) by mouth daily. 2.5mg  Monday and Friday.  5 mg the rest of the week  30 tablet  0   No current facility-administered medications for this visit.    No Known Allergies  History   Social History  .  Marital Status: Married    [name]    Number of Children: N/A  . Years of Education: 5   Occupational History  .     Social History Main Topics  . Smoking status: Former Smoker -- 0.50 packs/day for 15 years    Types: Cigarettes    Quit date: 10/26/1976  . Smokeless tobacco: Former Systems developer     Comment: 07/14/2013 "quit chewing and dipping in ~ 1970"  . Alcohol Use: Yes     Comment: 07/14/2013 "drank a little; quit in ~ 1970; never had problem w/it"  . Drug Use: No  . Sexual Activity: Not Currently   Other Topics Concern  . Not on file   Social History Narrative  . No narrative on file     Review of Systems: General: negative for chills, fever, night sweats or weight changes.  Cardiovascular: negative for chest pain, dyspnea on exertion, edema, orthopnea, palpitations, paroxysmal nocturnal dyspnea or shortness of breath Dermatological: negative for rash Respiratory: negative for cough or  wheezing Urologic: negative for hematuria Abdominal: negative for nausea, vomiting, diarrhea, bright red blood per rectum, melena, or hematemesis Neurologic: negative for visual changes, syncope, or dizziness All other systems reviewed and are otherwise negative except as noted above.    Blood pressure 140/80, pulse 80, height 5\' 9"  (1.753 m), weight 214 lb (97.07 kg).  General appearance: alert, cooperative and no distress Neck: no carotid bruit and no JVD Lungs: few rhonchi bilat Heart: irregularly irregular rhythm    ASSESSMENT AND PLAN:   Tachy-brady syndrome Stable, hoping to avoid a pacemaker.  Type II diabetes mellitus,  uncontrolled .  HYPERTENSION Refractory, on multiple medications.  SLEEP APNEA His wife says he is now compliant with C-pap  Acute ischemic VBA thalamic stroke Residual word finding deficet  Permanent atrial fibrillation Rate controlled  Chronic kidney disease (CKD), stage III (moderate) Last SCr 1.88   PLAN  I ordered renal artery dopplers and a BMP today. I made no medication changes. He will return in a month for follow up.   Tamantha Saline KPA-C 07/28/2013 2:15 PM

## 2013-07-28 NOTE — Assessment & Plan Note (Signed)
Rate controlled 

## 2013-07-28 NOTE — Patient Instructions (Signed)
Your physician has requested that you have a renal artery duplex. During this test, an ultrasound is used to evaluate blood flow to the kidneys. Allow one hour for this exam. Do not eat after midnight the day before and avoid carbonated beverages. Take your medications as you usually do. Your physician recommends that you have lab today Your physician recommends that you schedule a follow-up appointment in: one month

## 2013-07-28 NOTE — Assessment & Plan Note (Signed)
His wife says he is now compliant with C-pap

## 2013-07-28 NOTE — Assessment & Plan Note (Signed)
Refractory, on multiple medications.

## 2013-07-28 NOTE — Assessment & Plan Note (Signed)
Residual word finding deficet

## 2013-07-28 NOTE — Assessment & Plan Note (Signed)
Stable, hoping to avoid a pacemaker.

## 2013-07-28 NOTE — Assessment & Plan Note (Signed)
Last SCr 1.88

## 2013-07-29 LAB — BASIC METABOLIC PANEL
BUN: 23 mg/dL (ref 6–23)
CO2: 26 mEq/L (ref 19–32)
Calcium: 9.3 mg/dL (ref 8.4–10.5)
Chloride: 106 mEq/L (ref 96–112)
Creat: 1.79 mg/dL — ABNORMAL HIGH (ref 0.50–1.35)
Glucose, Bld: 66 mg/dL — ABNORMAL LOW (ref 70–99)
Potassium: 4.2 mEq/L (ref 3.5–5.3)
Sodium: 145 mEq/L (ref 135–145)

## 2013-07-31 ENCOUNTER — Other Ambulatory Visit: Payer: Self-pay | Admitting: *Deleted

## 2013-07-31 DIAGNOSIS — R5381 Other malaise: Secondary | ICD-10-CM

## 2013-07-31 DIAGNOSIS — R5383 Other fatigue: Principal | ICD-10-CM

## 2013-08-03 ENCOUNTER — Ambulatory Visit (INDEPENDENT_AMBULATORY_CARE_PROVIDER_SITE_OTHER): Payer: Medicare Other | Admitting: Pharmacist

## 2013-08-03 DIAGNOSIS — I4891 Unspecified atrial fibrillation: Secondary | ICD-10-CM

## 2013-08-03 DIAGNOSIS — I6322 Cerebral infarction due to unspecified occlusion or stenosis of basilar arteries: Secondary | ICD-10-CM

## 2013-08-03 DIAGNOSIS — Z7901 Long term (current) use of anticoagulants: Secondary | ICD-10-CM

## 2013-08-03 DIAGNOSIS — I6350 Cerebral infarction due to unspecified occlusion or stenosis of unspecified cerebral artery: Secondary | ICD-10-CM

## 2013-08-03 DIAGNOSIS — IMO0002 Reserved for concepts with insufficient information to code with codable children: Secondary | ICD-10-CM | POA: Diagnosis not present

## 2013-08-03 DIAGNOSIS — I6381 Other cerebral infarction due to occlusion or stenosis of small artery: Secondary | ICD-10-CM

## 2013-08-03 DIAGNOSIS — I63219 Cerebral infarction due to unspecified occlusion or stenosis of unspecified vertebral arteries: Secondary | ICD-10-CM

## 2013-08-03 DIAGNOSIS — E1165 Type 2 diabetes mellitus with hyperglycemia: Secondary | ICD-10-CM | POA: Diagnosis not present

## 2013-08-03 LAB — POCT INR: INR: 2

## 2013-08-03 NOTE — Patient Instructions (Signed)
Patient instructed to take medications as defined in the Anti-coagulation Track section of this encounter.  Patient instructed to take today's dose.  Patient verbalized understanding of these instructions.    

## 2013-08-03 NOTE — Progress Notes (Signed)
Anti-Coagulation Progress Note  Patrick [name] is a 68 y.o. male who is currently on an anti-coagulation regimen.    RECENT RESULTS: Recent results are below, the most recent result is correlated with a dose of 27.5 mg. per week: Lab Results  Component Value Date   INR 2.00 08/03/2013   INR 3.00 07/20/2013   INR 2.31* 07/18/2013    ANTI-COAG DOSE: Anticoagulation Dose Instructions as of 08/03/2013     [name] Tue Wed Thu Fri Sat   New Dose 5 mg 2.5 mg 5 mg 5 mg 5 mg 2.5 mg 5 mg       ANTICOAG SUMMARY: Anticoagulation Episode Summary   Current INR goal 2.0-3.0  Next INR check 08/31/2013  INR from last check 2.00 (08/03/2013)  Weekly max dose   Target end date Indefinite  [facility]  Preferred lab   Send INR reminders to    Indications  ATRIAL FIBRILLATION PAROXYSMAL CHRONIC (Resolved) [427.31] Acute ischemic VBA thalamic stroke [434.91] Chronic anticoagulation on coumadin [V58.61]        Comments         ANTICOAG TODAY: Anticoagulation Summary as of 08/03/2013   INR goal 2.0-3.0  Selected INR 2.00 (08/03/2013)  Next INR check 08/31/2013  Target end date Indefinite   Indications  ATRIAL FIBRILLATION PAROXYSMAL CHRONIC (Resolved) [427.31] Acute ischemic VBA thalamic stroke [434.91] Chronic anticoagulation on coumadin [V58.61]      Anticoagulation Episode Summary   [facility]   Preferred lab    Send INR reminders to    Comments       PATIENT INSTRUCTIONS: Patient Instructions  Patient instructed to take medications as defined in the Anti-coagulation Track section of this encounter.  Patient instructed to take today's dose.  Patient verbalized understanding of these instructions.       FOLLOW-UP Return in about 4 weeks (around 08/31/2013) for Follow up INR at 1100h.  [name], III Pharm.D., CACP

## 2013-08-04 ENCOUNTER — Telehealth: Payer: Self-pay | Admitting: *Deleted

## 2013-08-04 NOTE — Progress Notes (Signed)
Indication: Atrial fibrillation.  Duration: Lifelong.  INR: At target.  Agree with Dr. Gladstone Pih assessment and plan.

## 2013-08-04 NOTE — Telephone Encounter (Signed)
SPOKE WITH [name]. SAYS TO CANCEL GI REFERRAL FOR [name]. SHE CALLED THE GI OFFICE. UNABLE TO GET GI APPOINTMENT FOR HUSBAND DUE TO OUTSTANDING BILL AT THIS TIME.Marland Kitchen PATIENT STATES UNABLE TO PAY BILL.WILL HAVE REFERRAL CANCELED.   Patrick [name] STURDVIANT NTII 1-6-015   5:38PM

## 2013-08-05 NOTE — Addendum Note (Signed)
Addended by: Hulan Fray on: 08/05/2013 05:34 PM   Modules accepted: Orders

## 2013-08-05 NOTE — Telephone Encounter (Signed)
Pt to have lab drawn before office visit 1/29.  Kerin Ransom PA-C 08/05/2013 8:20 AM

## 2013-08-06 ENCOUNTER — Encounter (HOSPITAL_COMMUNITY): Payer: Medicare Other

## 2013-08-10 ENCOUNTER — Encounter: Payer: Medicare Other | Admitting: Internal Medicine

## 2013-08-10 ENCOUNTER — Encounter (HOSPITAL_COMMUNITY): Payer: Medicare Other

## 2013-08-12 NOTE — Addendum Note (Signed)
Addended by: Hulan Fray on: 08/12/2013 07:41 PM   Modules accepted: Orders

## 2013-08-13 ENCOUNTER — Other Ambulatory Visit (HOSPITAL_COMMUNITY): Payer: Self-pay | Admitting: Internal Medicine

## 2013-08-13 ENCOUNTER — Other Ambulatory Visit: Payer: Self-pay | Admitting: Internal Medicine

## 2013-08-18 ENCOUNTER — Encounter (HOSPITAL_COMMUNITY): Payer: Medicare Other

## 2013-08-19 ENCOUNTER — Other Ambulatory Visit: Payer: Self-pay | Admitting: Internal Medicine

## 2013-08-24 ENCOUNTER — Telehealth (HOSPITAL_COMMUNITY): Payer: Self-pay | Admitting: *Deleted

## 2013-08-27 ENCOUNTER — Ambulatory Visit: Payer: Medicare Other | Admitting: Cardiology

## 2013-08-31 ENCOUNTER — Ambulatory Visit: Payer: Medicare Other

## 2013-09-01 ENCOUNTER — Other Ambulatory Visit (HOSPITAL_COMMUNITY): Payer: Self-pay | Admitting: Internal Medicine

## 2013-09-08 ENCOUNTER — Telehealth (HOSPITAL_COMMUNITY): Payer: Self-pay | Admitting: *Deleted

## 2013-09-11 ENCOUNTER — Other Ambulatory Visit: Payer: Self-pay | Admitting: Internal Medicine

## 2013-09-14 ENCOUNTER — Ambulatory Visit (INDEPENDENT_AMBULATORY_CARE_PROVIDER_SITE_OTHER): Payer: Medicare Other | Admitting: Pharmacist

## 2013-09-14 DIAGNOSIS — I63219 Cerebral infarction due to unspecified occlusion or stenosis of unspecified vertebral arteries: Secondary | ICD-10-CM

## 2013-09-14 DIAGNOSIS — Z7901 Long term (current) use of anticoagulants: Secondary | ICD-10-CM | POA: Diagnosis not present

## 2013-09-14 DIAGNOSIS — I6381 Other cerebral infarction due to occlusion or stenosis of small artery: Secondary | ICD-10-CM

## 2013-09-14 DIAGNOSIS — I6322 Cerebral infarction due to unspecified occlusion or stenosis of basilar arteries: Secondary | ICD-10-CM

## 2013-09-14 DIAGNOSIS — E1165 Type 2 diabetes mellitus with hyperglycemia: Secondary | ICD-10-CM | POA: Diagnosis not present

## 2013-09-14 DIAGNOSIS — I6350 Cerebral infarction due to unspecified occlusion or stenosis of unspecified cerebral artery: Secondary | ICD-10-CM

## 2013-09-14 DIAGNOSIS — I4891 Unspecified atrial fibrillation: Secondary | ICD-10-CM

## 2013-09-14 DIAGNOSIS — IMO0002 Reserved for concepts with insufficient information to code with codable children: Secondary | ICD-10-CM | POA: Diagnosis not present

## 2013-09-14 LAB — POCT INR: INR: 2

## 2013-09-14 NOTE — Patient Instructions (Signed)
Patient instructed to take medications as defined in the Anti-coagulation Track section of this encounter.  Patient instructed to take today's dose.  Patient verbalized understanding of these instructions.    

## 2013-09-14 NOTE — Progress Notes (Signed)
Anti-Coagulation Progress Note  Patrick [name] is a 68 y.o. male who is currently on an anti-coagulation regimen.    RECENT RESULTS: Recent results are below, the most recent result is correlated with a dose of 30 mg. per week: Lab Results  Component Value Date   INR 2.00 09/14/2013   INR 2.00 08/03/2013   INR 3.00 07/20/2013    ANTI-COAG DOSE: Anticoagulation Dose Instructions as of 09/14/2013     [name] Tue Wed Thu Fri Sat   New Dose 5 mg 5 mg 5 mg 5 mg 5 mg 2.5 mg 5 mg       ANTICOAG SUMMARY: Anticoagulation Episode Summary   Current INR goal 2.0-3.0  Next INR check 09/28/2013  INR from last check 2.00 (09/14/2013)  Weekly max dose   Target end date Indefinite  [facility]  Preferred lab   Send INR reminders to    Indications  ATRIAL FIBRILLATION PAROXYSMAL CHRONIC (Resolved) [427.31] Acute ischemic VBA thalamic stroke [434.91] Chronic anticoagulation on coumadin [V58.61]        Comments         ANTICOAG TODAY: Anticoagulation Summary as of 09/14/2013   INR goal 2.0-3.0  Selected INR 2.00 (09/14/2013)  Next INR check 09/28/2013  Target end date Indefinite   Indications  ATRIAL FIBRILLATION PAROXYSMAL CHRONIC (Resolved) [427.31] Acute ischemic VBA thalamic stroke [434.91] Chronic anticoagulation on coumadin [V58.61]      Anticoagulation Episode Summary   [facility]   Preferred lab    Send INR reminders to    Comments       PATIENT INSTRUCTIONS: Patient Instructions  Patient instructed to take medications as defined in the Anti-coagulation Track section of this encounter.  Patient instructed to take today's dose.  Patient verbalized understanding of these instructions.       FOLLOW-UP Return in 2 weeks (on 09/28/2013) for Follow up INR at [place], III Pharm.D., CACP

## 2013-09-16 ENCOUNTER — Other Ambulatory Visit (HOSPITAL_COMMUNITY): Payer: Self-pay | Admitting: Internal Medicine

## 2013-09-26 ENCOUNTER — Other Ambulatory Visit (HOSPITAL_COMMUNITY): Payer: Self-pay | Admitting: Internal Medicine

## 2013-09-28 ENCOUNTER — Ambulatory Visit (INDEPENDENT_AMBULATORY_CARE_PROVIDER_SITE_OTHER): Payer: Medicare Other | Admitting: Pharmacist

## 2013-09-28 DIAGNOSIS — I6381 Other cerebral infarction due to occlusion or stenosis of small artery: Secondary | ICD-10-CM

## 2013-09-28 DIAGNOSIS — Z7901 Long term (current) use of anticoagulants: Secondary | ICD-10-CM | POA: Diagnosis not present

## 2013-09-28 DIAGNOSIS — I4891 Unspecified atrial fibrillation: Secondary | ICD-10-CM

## 2013-09-28 DIAGNOSIS — E1165 Type 2 diabetes mellitus with hyperglycemia: Secondary | ICD-10-CM | POA: Diagnosis not present

## 2013-09-28 DIAGNOSIS — I6350 Cerebral infarction due to unspecified occlusion or stenosis of unspecified cerebral artery: Secondary | ICD-10-CM

## 2013-09-28 DIAGNOSIS — N183 Chronic kidney disease, stage 3 unspecified: Secondary | ICD-10-CM | POA: Diagnosis not present

## 2013-09-28 DIAGNOSIS — I129 Hypertensive chronic kidney disease with stage 1 through stage 4 chronic kidney disease, or unspecified chronic kidney disease: Secondary | ICD-10-CM | POA: Diagnosis not present

## 2013-09-28 DIAGNOSIS — I6322 Cerebral infarction due to unspecified occlusion or stenosis of basilar arteries: Secondary | ICD-10-CM

## 2013-09-28 DIAGNOSIS — I63219 Cerebral infarction due to unspecified occlusion or stenosis of unspecified vertebral arteries: Secondary | ICD-10-CM

## 2013-09-28 DIAGNOSIS — I10 Essential (primary) hypertension: Secondary | ICD-10-CM | POA: Diagnosis not present

## 2013-09-28 DIAGNOSIS — R109 Unspecified abdominal pain: Secondary | ICD-10-CM | POA: Diagnosis not present

## 2013-09-28 DIAGNOSIS — E119 Type 2 diabetes mellitus without complications: Secondary | ICD-10-CM | POA: Diagnosis not present

## 2013-09-28 DIAGNOSIS — Z5181 Encounter for therapeutic drug level monitoring: Secondary | ICD-10-CM | POA: Diagnosis not present

## 2013-09-28 DIAGNOSIS — IMO0002 Reserved for concepts with insufficient information to code with codable children: Secondary | ICD-10-CM | POA: Diagnosis not present

## 2013-09-28 LAB — POCT INR: INR: 2

## 2013-09-28 NOTE — Patient Instructions (Signed)
Patient instructed to take medications as defined in the Anti-coagulation Track section of this encounter.  Patient instructed to take today's dose.  Patient verbalized understanding of these instructions.    

## 2013-09-28 NOTE — Progress Notes (Signed)
Patient instructed to take medications as defined in the Anti-coagulation Track section of this encounter.  Patient instructed to take today's dose.  Patient verbalized understanding of these instructions.    

## 2013-09-29 NOTE — Progress Notes (Signed)
Indication: Atrial fibrillation.  Duration: Lifelong.  INR:  At target.  I agree with Dr. Gladstone Pih assessment and plan as documented.

## 2013-10-12 ENCOUNTER — Encounter: Payer: Medicare Other | Admitting: Internal Medicine

## 2013-10-12 ENCOUNTER — Encounter: Payer: Self-pay | Admitting: Internal Medicine

## 2013-10-12 ENCOUNTER — Ambulatory Visit (INDEPENDENT_AMBULATORY_CARE_PROVIDER_SITE_OTHER): Payer: Medicare Other | Admitting: Internal Medicine

## 2013-10-12 VITALS — BP 181/108 | HR 71 | Temp 97.9°F | Ht 68.0 in | Wt 201.8 lb

## 2013-10-12 DIAGNOSIS — I4891 Unspecified atrial fibrillation: Secondary | ICD-10-CM | POA: Diagnosis not present

## 2013-10-12 DIAGNOSIS — N183 Chronic kidney disease, stage 3 unspecified: Secondary | ICD-10-CM | POA: Diagnosis not present

## 2013-10-12 DIAGNOSIS — I6350 Cerebral infarction due to unspecified occlusion or stenosis of unspecified cerebral artery: Secondary | ICD-10-CM

## 2013-10-12 DIAGNOSIS — E118 Type 2 diabetes mellitus with unspecified complications: Principal | ICD-10-CM

## 2013-10-12 DIAGNOSIS — Z5181 Encounter for therapeutic drug level monitoring: Secondary | ICD-10-CM | POA: Diagnosis not present

## 2013-10-12 DIAGNOSIS — E1165 Type 2 diabetes mellitus with hyperglycemia: Secondary | ICD-10-CM | POA: Diagnosis not present

## 2013-10-12 DIAGNOSIS — R109 Unspecified abdominal pain: Secondary | ICD-10-CM

## 2013-10-12 DIAGNOSIS — I129 Hypertensive chronic kidney disease with stage 1 through stage 4 chronic kidney disease, or unspecified chronic kidney disease: Secondary | ICD-10-CM | POA: Diagnosis not present

## 2013-10-12 DIAGNOSIS — IMO0002 Reserved for concepts with insufficient information to code with codable children: Secondary | ICD-10-CM | POA: Diagnosis not present

## 2013-10-12 DIAGNOSIS — I10 Essential (primary) hypertension: Secondary | ICD-10-CM

## 2013-10-12 DIAGNOSIS — E119 Type 2 diabetes mellitus without complications: Secondary | ICD-10-CM | POA: Diagnosis not present

## 2013-10-12 DIAGNOSIS — Z7901 Long term (current) use of anticoagulants: Secondary | ICD-10-CM | POA: Diagnosis not present

## 2013-10-12 LAB — POCT GLYCOSYLATED HEMOGLOBIN (HGB A1C): HEMOGLOBIN A1C: 7.1

## 2013-10-12 LAB — GLUCOSE, CAPILLARY: GLUCOSE-CAPILLARY: 141 mg/dL — AB (ref 70–99)

## 2013-10-12 MED ORDER — LISINOPRIL 10 MG PO TABS: ORAL_TABLET | ORAL | Status: DC

## 2013-10-12 NOTE — Patient Instructions (Signed)
I will call you with the results of the urinalysis. Continue your medications as discussed. Increase Lisinopril to 10mg  daily. (you can take 2 of the 5mg  pills until that runs out) You can take one 500mg  Tylenol every 6 hours for pain, please make sure to come to your appointment with Dr. Elie Confer next week.

## 2013-10-12 NOTE — Progress Notes (Signed)
[facility] Subjective:   Patient ID: Patrick [name] male   DOB: 01-11-1946 68 y.o.   MRN: [id number]  HPI: Patrick [name] is a 68 y.o. male with a PMH significant for Permanent Afib (A/C with Coumadin, rate control with dilt), Tachy Brady syndrome, T2DM, HTN, CAD, CVA, CKD Stage 3, HLD, Sleep apnea  He brings his medications for review and reports he has been taking all of them as prescribed.  He denies any issues with his medications. He reports he has been using his CPAP every night.  He has not taken his AM BP medications yet.  His main complaint this morning his left hip pain that has been ongoing for the last 2 weeks, he denies any falls at home or any trauma to the area.  He points to a spot on his left lateral flank area and reports the pain is currently a 7/10.  He reports the pain varies in intensity and he does not know any aggravating causes.  He denies any history of similar pain but does note he had some left knee pain preceding this pain.  He denies any history of kidney stones, denies dysuria, denies hematuria.   T2DM He denies increased thirst, polyuria, or blurry vision. He reports he has not been checking his blood sugars at home.  He reports this is because "he has not thought about it lately."  He reports he has been taking his insulin NPH only at night and has been taking about "4 blocks."  Of note he has been instructed to take Humulin N 20 units in the morning and 10 units at night, he has been prescribed insulin syringes that can hold 0.25m.  He is resistant to changing his insulin regimen or using pens.   Past Medical History  Diagnosis Date  . Hypertension   . CKD (chronic kidney disease), stage II   . CAD (coronary artery disease)   . Depression   . Gout   . Hyperlipidemia   . 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 GERD (gastroesophageal reflux disease)   . Diabetes mellitus type II      Insulin dependent  . Upper GI bleeding 07/01/2013  . Paroxysmal atrial fibrillation     on coumadin  . Atrial fibrillation   . Myocardial infarction 2000's    "near heart attack" (07/14/2013)  . Shortness of breath     "can happen at any time" (07/14/2013)  . Sleep apnea 07/2010    "has mask at home; seldom uses it" (07/14/2013)  . Stomach ulcer 1950's    "as a teenager"  . Stroke ~ 2007; ~ 2009    "memory not as good since" (07/14/2013)  . Permanent atrial fibrillation 07/17/2013  . Chronic anticoagulation, on coumadin 07/17/2013  . Tachy-brady syndrome 07/17/2013   Current Outpatient Prescriptions  Medication Sig Dispense Refill  . allopurinol (ZYLOPRIM) 100 MG tablet Take 200 mg by mouth daily.      .Marland Kitchenallopurinol (ZYLOPRIM) 100 MG tablet TAKE TWO TABLETS BY MOUTH ONCE DAILY  60 tablet  6  . aspirin 81 MG EC tablet Take 81 mg by mouth daily.        . Blood Glucose Monitoring Suppl (ACCU-CHEK AVIVA PLUS) W/DEVICE KIT 1 each by Does not apply route as needed.  1 kit  0  . cloNIDine (CATAPRES) 0.3 MG tablet Take 1 tablet (0.3 mg total) by mouth 2 (two) times daily.  180 tablet  1  . diltiazem (CARDIZEM) 30 MG tablet Take 1 tablet (30 mg total) by mouth 3 (three) times daily.  180 tablet  2  . furosemide (LASIX) 20 MG tablet TAKE ONE TABLET BY MOUTH ONCE DAILY  30 tablet  0  . glipiZIDE (GLUCOTROL XL) 10 MG 24 hr tablet Take 10 mg by mouth daily with breakfast.      . glucose blood (TRUETRACK TEST) test strip Use to test blood sugar 2-3 times daily dx code 250.00  100 each  11  . hydrALAZINE (APRESOLINE) 100 MG tablet TAKE ONE TABLET BY MOUTH EVERY 8 HOURS  90 tablet  0  . insulin NPH (HUMULIN N,NOVOLIN N) 100 UNIT/ML injection Inject 10-20 Units into the skin 2 (two) times daily. Takes 20 units in the morning and 10 units in the evening.      . Insulin Syringe-Needle U-100 (ELITE-THIN INS SYR .5CC/31G) 31G X 5/16" 0.5 ML MISC 1 each by Does not apply route 3 (three) times daily. Use to  inject insulin daily  100 each  11  . Lancets 30G MISC Use to test blood glucose 2-3 times daily dx code 250.00  100 each  11  . lisinopril (PRINIVIL,ZESTRIL) 10 MG tablet TAKE ONE TABLET BY MOUTH ONCE DAILY  30 tablet  0  . potassium chloride (MICRO-K) 10 MEQ CR capsule TAKE ONE CAPSULE BY MOUTH TWICE DAILY  60 capsule  0  . pravastatin (PRAVACHOL) 80 MG tablet Take 1 tablet (80 mg total) by mouth daily.  30 tablet  6  . warfarin (COUMADIN) 5 MG tablet TAKE ONE-HALF TO ONE TABLET (2.5-5 MG TOTAL) BY MOUTH ONCE DAILY. 2.5MG MONDAY AND FRIDAY, 5MG THE REST OF THE WEEK  30 tablet  1   No current facility-administered medications for this visit.   No family history on file. History   Social History  . Marital Status: Married    [name]    Number of Children: N/A  . Years of Education: 5   Occupational History  .     Social History Main Topics  . Smoking status: Former Smoker -- 0.50 packs/day for 15 years    Types: Cigarettes    Quit date: 10/26/1976  . Smokeless tobacco: Former Systems developer     Comment: 07/14/2013 "quit chewing and dipping in ~ 1970"  . Alcohol Use: Yes     Comment: 07/14/2013 "drank a little; quit in ~ 1970; never had problem w/it"  . Drug Use: No  . Sexual Activity: Not Currently   Other Topics Concern  . None   Social History Narrative  . None   Review of Systems: Review of Systems  Constitutional: Negative for fever, chills and malaise/fatigue.  Eyes: Negative for blurred vision.  Respiratory: Negative for cough and shortness of breath.   Cardiovascular: Positive for leg swelling (occasionally). Negative for chest pain and orthopnea.  Gastrointestinal: Negative for nausea, vomiting and abdominal pain.  Genitourinary: Positive for flank pain (left side). Negative for dysuria and hematuria.  Musculoskeletal: Positive for back pain (sometimes). Negative for falls.  Neurological: Negative for dizziness, focal weakness, seizures, weakness and headaches.   Psychiatric/Behavioral: Negative for depression. The patient is not nervous/anxious.     Objective:  Physical Exam: Filed Vitals:   10/12/13 0917  BP: 181/108  Pulse: 71  Temp: 97.9 F (36.6 C)  TempSrc: Oral  Height: 5' 8" (1.727 m)  Weight: 201 lb 12.8 oz (91.536 kg)  SpO2: 99%  Physical Exam  Nursing note and vitals reviewed. Constitutional: He is oriented to person, place, and time and well-developed, well-nourished, and in no distress. No distress.  Cardiovascular: Normal rate, regular rhythm, normal heart sounds and intact distal pulses.   No murmur heard. Pulmonary/Chest: Effort normal and breath sounds normal. No respiratory distress. He has no wheezes. He has no rales.  Abdominal: Soft. Bowel sounds are normal. He exhibits no distension. There is no tenderness.  No CVA tenderness B/L  Musculoskeletal:       Left hip: He exhibits normal range of motion, normal strength, no tenderness, no bony tenderness, no swelling and no crepitus.       Right knee: He exhibits normal range of motion and no swelling. No tenderness found.       Left knee: He exhibits normal range of motion and no swelling. No tenderness found.  Straight leg raise negative bilaterally. No tenderness over lumbar spine, no tenderness over left flank.  Neurological: He is alert and oriented to person, place, and time.  Skin: He is not diaphoretic.  ; Assessment & Plan:  Case discussed with Dr. Eppie Gibson See Problem Based Assessment and Plan Medications Ordered Meds ordered this encounter  Medications  . DISCONTD: cloNIDine (CATAPRES) 0.3 MG tablet    Sig: 0.3 mg 2 (two) times daily.  Marland Kitchen lisinopril (PRINIVIL,ZESTRIL) 10 MG tablet    Sig: TAKE ONE TABLET BY MOUTH ONCE DAILY    Dispense:  30 tablet    Refill:  0   Other Orders Orders Placed This Encounter  Procedures  . Glucose, capillary  . Urinalysis, Reflex Microscopic  . POCT HgB A1C

## 2013-10-12 NOTE — Assessment & Plan Note (Signed)
Patient denies Hx of nephrolithiasis and denies hematuria.  Has no bony tenderness on exam and no pinpoint tenderness.  He is on coumadin but has not been supratheraputic and no signs of ecchymosis or retroperitoneal hemorrhage.  Likely this is MSK in origin.  Will obtain a U/A to rule out infection and hematuria (if positive will obtain a CT to r/o nephrolithiasis).

## 2013-10-12 NOTE — Assessment & Plan Note (Signed)
Lab Results  Component Value Date   HGBA1C 7.1 10/12/2013   HGBA1C 9.1 07/06/2013   HGBA1C 8.9 04/07/2013     Assessment: Diabetes control: good control (HgbA1C at goal) Progress toward A1C goal:  improved Comments: A1c of 7.1, I would consider this at goal for Patrick [name] as he does not check his CBG frequently and more aggressive control would likely be detrimental.  Plan: Medications:  Continue Insulin NPH (note patient was instructed to take 20 units in AM, 10 in PM, however has been taking an unclear amount, however this has been working) Home glucose monitoring: Frequency: 2 times a day Timing: before breakfast;before dinner Instruction/counseling given: reminded to get eye exam, reminded to bring blood glucose meter & log to each visit and reminded to bring medications to each visit Educational resources provided: brochure Self management tools provided:   Other plans: Patient's unknown amount of insulin injection is concern but his diabetes has been difficult to control.  He is now at goal but is taking an unclear amount of NPH insulin.  He was instructed to write down the amount of insulin he is taking and bring this to his next appointment.  He was encouraged to increase his glucose monitoring from once a day to twice a day, and to bring his glucometer to his next appointment.

## 2013-10-12 NOTE — Assessment & Plan Note (Signed)
On small dose ACEi.  Previously uncontrolled diabetes.  Currently uncontrolled hypertension. Increase Lisinopril to 10mg  daily. Will continue to titrate this up and hopefully tritrate down clonidine.

## 2013-10-12 NOTE — Assessment & Plan Note (Signed)
BP Readings from Last 3 Encounters:  10/12/13 181/108  07/28/13 140/80  07/20/13 189/106    Lab Results  Component Value Date   NA 145 07/28/2013   K 4.2 07/28/2013   CREATININE 1.79* 07/28/2013    Assessment: Blood pressure control: moderately elevated Progress toward BP goal:  deteriorated Comments: Did not take BP meds this AM  Plan: Medications:  Clonidine 0.3mg  BID, Diltiazem 30mg  TID, Hydralazine 100mg  TID, Increase Lisinopril to 10mg  daily Educational resources provided: brochure Self management tools provided:   Other plans: Given patient's history of noncompliance and poor understanding of his disease clonidine is not an ideal medication.  Will repeat BMP at follow up and continue to increase lisinopril,  Will try to reduce clonidine once BP is better controlled with increasing lisinopril.

## 2013-10-13 LAB — URINALYSIS, MICROSCOPIC ONLY
BACTERIA UA: NONE SEEN
Casts: NONE SEEN
Crystals: NONE SEEN
Squamous Epithelial / LPF: NONE SEEN

## 2013-10-13 LAB — URINALYSIS, ROUTINE W REFLEX MICROSCOPIC
BILIRUBIN URINE: NEGATIVE
Glucose, UA: NEGATIVE mg/dL
Hgb urine dipstick: NEGATIVE
KETONES UR: NEGATIVE mg/dL
Leukocytes, UA: NEGATIVE
NITRITE: NEGATIVE
PROTEIN: 100 mg/dL — AB
SPECIFIC GRAVITY, URINE: 1.016 (ref 1.005–1.030)
UROBILINOGEN UA: 0.2 mg/dL (ref 0.0–1.0)
pH: 5.5 (ref 5.0–8.0)

## 2013-10-13 NOTE — Progress Notes (Signed)
Case discussed with Dr. Hoffman at the time of the visit.  We reviewed the resident's history and exam and pertinent patient test results.  I agree with the assessment, diagnosis and plan of care documented in the resident's note. 

## 2013-10-15 ENCOUNTER — Other Ambulatory Visit (HOSPITAL_COMMUNITY): Payer: Self-pay | Admitting: Internal Medicine

## 2013-10-16 ENCOUNTER — Other Ambulatory Visit: Payer: Self-pay | Admitting: Internal Medicine

## 2013-10-19 ENCOUNTER — Ambulatory Visit (INDEPENDENT_AMBULATORY_CARE_PROVIDER_SITE_OTHER): Payer: Medicare Other | Admitting: Pharmacist

## 2013-10-19 DIAGNOSIS — I6381 Other cerebral infarction due to occlusion or stenosis of small artery: Secondary | ICD-10-CM

## 2013-10-19 DIAGNOSIS — I6350 Cerebral infarction due to unspecified occlusion or stenosis of unspecified cerebral artery: Secondary | ICD-10-CM | POA: Diagnosis not present

## 2013-10-19 DIAGNOSIS — Z7901 Long term (current) use of anticoagulants: Secondary | ICD-10-CM | POA: Diagnosis not present

## 2013-10-19 DIAGNOSIS — I63219 Cerebral infarction due to unspecified occlusion or stenosis of unspecified vertebral arteries: Secondary | ICD-10-CM

## 2013-10-19 DIAGNOSIS — I4891 Unspecified atrial fibrillation: Secondary | ICD-10-CM

## 2013-10-19 DIAGNOSIS — I6322 Cerebral infarction due to unspecified occlusion or stenosis of basilar arteries: Secondary | ICD-10-CM

## 2013-10-19 LAB — POCT INR: INR: 3

## 2013-10-19 NOTE — Patient Instructions (Signed)
Patient instructed to take medications as defined in the Anti-coagulation Track section of this encounter.  Patient instructed to take today's dose.  Patient verbalized understanding of these instructions.    

## 2013-10-19 NOTE — Progress Notes (Signed)
Anti-Coagulation Progress Note  Patrick [name] is a 68 y.o. male who is currently on an anti-coagulation regimen.    RECENT RESULTS: Recent results are below, the most recent result is correlated with a dose of 40 mg. per week: Lab Results  Component Value Date   INR 3.00 10/19/2013   INR 2.0 09/28/2013   INR 2.00 09/14/2013    ANTI-COAG DOSE: Anticoagulation Dose Instructions as of 10/19/2013     [name] Tue Wed Thu Fri Sat   New Dose 5 mg 5 mg 5 mg 7.5 mg 5 mg 5 mg 5 mg       ANTICOAG SUMMARY: Anticoagulation Episode Summary   Current INR goal 2.0-3.0  Next INR check 11/16/2013  INR from last check 3.00 (10/19/2013)  Weekly max dose   Target end date Indefinite  [facility]  Preferred lab   Send INR reminders to    Indications  ATRIAL FIBRILLATION PAROXYSMAL CHRONIC (Resolved) [427.31] Acute ischemic VBA thalamic stroke [434.91] Chronic anticoagulation on coumadin [V58.61]        Comments         ANTICOAG TODAY: Anticoagulation Summary as of 10/19/2013   INR goal 2.0-3.0  Selected INR 3.00 (10/19/2013)  Next INR check 11/16/2013  Target end date Indefinite   Indications  ATRIAL FIBRILLATION PAROXYSMAL CHRONIC (Resolved) [427.31] Acute ischemic VBA thalamic stroke [434.91] Chronic anticoagulation on coumadin [V58.61]      Anticoagulation Episode Summary   [facility]   Preferred lab    Send INR reminders to    Comments       PATIENT INSTRUCTIONS: Patient Instructions  Patient instructed to take medications as defined in the Anti-coagulation Track section of this encounter.  Patient instructed to take today's dose.  Patient verbalized understanding of these instructions.       FOLLOW-UP Return in 4 weeks (on 11/16/2013) for Follow up INR at 3:15PM.  [name], III Pharm.D., CACP

## 2013-11-10 ENCOUNTER — Encounter: Payer: Medicare Other | Admitting: Internal Medicine

## 2013-11-12 ENCOUNTER — Other Ambulatory Visit: Payer: Self-pay | Admitting: Internal Medicine

## 2013-11-16 ENCOUNTER — Ambulatory Visit (INDEPENDENT_AMBULATORY_CARE_PROVIDER_SITE_OTHER): Payer: Medicare Other | Admitting: Pharmacist

## 2013-11-16 DIAGNOSIS — I4891 Unspecified atrial fibrillation: Secondary | ICD-10-CM

## 2013-11-16 DIAGNOSIS — I6381 Other cerebral infarction due to occlusion or stenosis of small artery: Secondary | ICD-10-CM

## 2013-11-16 DIAGNOSIS — I951 Orthostatic hypotension: Secondary | ICD-10-CM | POA: Diagnosis not present

## 2013-11-16 DIAGNOSIS — R109 Unspecified abdominal pain: Secondary | ICD-10-CM | POA: Diagnosis not present

## 2013-11-16 DIAGNOSIS — IMO0002 Reserved for concepts with insufficient information to code with codable children: Secondary | ICD-10-CM | POA: Diagnosis not present

## 2013-11-16 DIAGNOSIS — E869 Volume depletion, unspecified: Secondary | ICD-10-CM | POA: Diagnosis not present

## 2013-11-16 DIAGNOSIS — I6322 Cerebral infarction due to unspecified occlusion or stenosis of basilar arteries: Secondary | ICD-10-CM

## 2013-11-16 DIAGNOSIS — R1013 Epigastric pain: Secondary | ICD-10-CM | POA: Diagnosis not present

## 2013-11-16 DIAGNOSIS — E1165 Type 2 diabetes mellitus with hyperglycemia: Secondary | ICD-10-CM | POA: Diagnosis not present

## 2013-11-16 DIAGNOSIS — K920 Hematemesis: Secondary | ICD-10-CM | POA: Diagnosis not present

## 2013-11-16 DIAGNOSIS — E119 Type 2 diabetes mellitus without complications: Secondary | ICD-10-CM | POA: Diagnosis not present

## 2013-11-16 DIAGNOSIS — I63219 Cerebral infarction due to unspecified occlusion or stenosis of unspecified vertebral arteries: Secondary | ICD-10-CM

## 2013-11-16 DIAGNOSIS — I6350 Cerebral infarction due to unspecified occlusion or stenosis of unspecified cerebral artery: Secondary | ICD-10-CM

## 2013-11-16 DIAGNOSIS — I129 Hypertensive chronic kidney disease with stage 1 through stage 4 chronic kidney disease, or unspecified chronic kidney disease: Secondary | ICD-10-CM | POA: Diagnosis not present

## 2013-11-16 DIAGNOSIS — Z7901 Long term (current) use of anticoagulants: Secondary | ICD-10-CM

## 2013-11-16 DIAGNOSIS — R110 Nausea: Secondary | ICD-10-CM | POA: Diagnosis not present

## 2013-11-16 DIAGNOSIS — IMO0001 Reserved for inherently not codable concepts without codable children: Secondary | ICD-10-CM | POA: Diagnosis not present

## 2013-11-16 DIAGNOSIS — Z5181 Encounter for therapeutic drug level monitoring: Secondary | ICD-10-CM | POA: Diagnosis not present

## 2013-11-16 DIAGNOSIS — I10 Essential (primary) hypertension: Secondary | ICD-10-CM | POA: Diagnosis not present

## 2013-11-16 DIAGNOSIS — N183 Chronic kidney disease, stage 3 unspecified: Secondary | ICD-10-CM | POA: Diagnosis not present

## 2013-11-16 LAB — POCT INR: INR: 4

## 2013-11-16 NOTE — Progress Notes (Signed)
Anti-Coagulation Progress Note  Patrick [name] is a 69 y.o. male who is currently on an anti-coagulation regimen.    RECENT RESULTS: Recent results are below, the most recent result is correlated with a dose of 37.5 mg. per week: Lab Results  Component Value Date   INR 4.00 11/16/2013   INR 3.00 10/19/2013   INR 2.0 09/28/2013    ANTI-COAG DOSE: Anticoagulation Dose Instructions as of 11/16/2013     [name] Tue Wed Thu Fri Sat   New Dose 5 mg 2.5 mg 5 mg 5 mg 5 mg 5 mg 5 mg       ANTICOAG SUMMARY: Anticoagulation Episode Summary   Current INR goal 2.0-3.0  Next INR check 12/07/2013  INR from last check 4.00! (11/16/2013)  Weekly max dose   Target end date Indefinite  [facility] check location [facility]  Preferred lab   Send INR reminders to    Indications  ATRIAL FIBRILLATION PAROXYSMAL CHRONIC (Resolved) [427.31] Acute ischemic VBA thalamic stroke [434.91] Chronic anticoagulation on coumadin [V58.61]        Comments         ANTICOAG TODAY: Anticoagulation Summary as of 11/16/2013   INR goal 2.0-3.0  Selected INR 4.00! (11/16/2013)  Next INR check 12/07/2013  Target end date Indefinite   Indications  ATRIAL FIBRILLATION PAROXYSMAL CHRONIC (Resolved) [427.31] Acute ischemic VBA thalamic stroke [434.91] Chronic anticoagulation on coumadin [V58.61]      Anticoagulation Episode Summary   [facility] check [facility]   Preferred lab    Send INR reminders to    Comments       PATIENT INSTRUCTIONS: Patient Instructions  Patient instructed to take medications as defined in the Anti-coagulation Track section of this encounter.  Patient instructed to take today's dose.  Patient verbalized understanding of these instructions.       FOLLOW-UP Return in 3 weeks (on 12/07/2013) for Follow up INR at 3:45PM.  [name], III Pharm.D., CACP

## 2013-11-16 NOTE — Patient Instructions (Signed)
Patient instructed to take medications as defined in the Anti-coagulation Track section of this encounter.  Patient instructed to take today's dose.  Patient verbalized understanding of these instructions.    

## 2013-11-17 ENCOUNTER — Encounter: Payer: Medicare Other | Admitting: Internal Medicine

## 2013-11-17 NOTE — Progress Notes (Signed)
Indication: Atrial fibrillation.  Duration: Lifelong.  INR: Above target.  Agree with Dr. Gladstone Pih assessment and plan.

## 2013-11-20 ENCOUNTER — Other Ambulatory Visit: Payer: Self-pay | Admitting: Internal Medicine

## 2013-11-24 ENCOUNTER — Encounter: Payer: Medicare Other | Admitting: Internal Medicine

## 2013-12-07 ENCOUNTER — Ambulatory Visit (INDEPENDENT_AMBULATORY_CARE_PROVIDER_SITE_OTHER): Payer: Medicare Other | Admitting: Pharmacist

## 2013-12-07 DIAGNOSIS — E1165 Type 2 diabetes mellitus with hyperglycemia: Secondary | ICD-10-CM | POA: Diagnosis not present

## 2013-12-07 DIAGNOSIS — Z7901 Long term (current) use of anticoagulants: Secondary | ICD-10-CM | POA: Diagnosis not present

## 2013-12-07 DIAGNOSIS — I4891 Unspecified atrial fibrillation: Secondary | ICD-10-CM | POA: Diagnosis not present

## 2013-12-07 DIAGNOSIS — I6322 Cerebral infarction due to unspecified occlusion or stenosis of basilar arteries: Secondary | ICD-10-CM

## 2013-12-07 DIAGNOSIS — I6350 Cerebral infarction due to unspecified occlusion or stenosis of unspecified cerebral artery: Secondary | ICD-10-CM | POA: Diagnosis not present

## 2013-12-07 DIAGNOSIS — IMO0002 Reserved for concepts with insufficient information to code with codable children: Secondary | ICD-10-CM | POA: Diagnosis not present

## 2013-12-07 DIAGNOSIS — I63219 Cerebral infarction due to unspecified occlusion or stenosis of unspecified vertebral arteries: Secondary | ICD-10-CM

## 2013-12-07 DIAGNOSIS — I6381 Other cerebral infarction due to occlusion or stenosis of small artery: Secondary | ICD-10-CM

## 2013-12-07 LAB — POCT INR: INR: 2.6

## 2013-12-07 NOTE — Patient Instructions (Signed)
Anti-Coagulation Progress Note  Cartez Tritz is a 68 y.o. male who is currently on an anti-coagulation regimen.    RECENT RESULTS: Recent results are below, the most recent result is correlated with a dose of 32.5 mg. per week: Lab Results  Component Value Date   INR 2.60 12/07/2013   INR 4.00 11/16/2013   INR 3.00 10/19/2013    ANTI-COAG DOSE: Anticoagulation Dose Instructions as of 12/07/2013     [name] Tue Wed Thu Fri Sat   New Dose 5 mg 2.5 mg 5 mg 5 mg 5 mg 5 mg 5 mg       ANTICOAG SUMMARY: @ANTICOAGSUMMARY @  ANTICOAG TODAY: @ANTICOAGTODAY @  PATIENT INSTRUCTIONS: @PATINSTR @   FOLLOW-UP No Follow-up on file.  [name], III Pharm.D., CACP

## 2013-12-07 NOTE — Progress Notes (Signed)
Anti-Coagulation Progress Note  Patrick [name] is a 68 y.o. male who is currently on an anti-coagulation regimen.    RECENT RESULTS: Recent results are below, the most recent result is correlated with a dose of 32.5 mg. per week: Lab Results  Component Value Date   INR 2.60 12/07/2013   INR 4.00 11/16/2013   INR 3.00 10/19/2013    ANTI-COAG DOSE: Anticoagulation Dose Instructions as of 12/07/2013     [name] Tue Wed Thu Fri Sat   New Dose 5 mg 2.5 mg 5 mg 5 mg 5 mg 5 mg 5 mg       ANTICOAG SUMMARY: Anticoagulation Episode Summary   Current INR goal 2.0-3.0  Next INR check 01/04/2014  INR from last check 2.60 (12/07/2013)  Weekly max dose   Target end date Indefinite  [facility]  Preferred lab   Send INR reminders to    Indications  ATRIAL FIBRILLATION PAROXYSMAL CHRONIC (Resolved) [427.31] Acute ischemic VBA thalamic stroke [434.91] Chronic anticoagulation on coumadin [V58.61]        Comments         ANTICOAG TODAY: Anticoagulation Summary as of 12/07/2013   INR goal 2.0-3.0  Selected INR 2.60 (12/07/2013)  Next INR check 01/04/2014  Target end date Indefinite   Indications  ATRIAL FIBRILLATION PAROXYSMAL CHRONIC (Resolved) [427.31] Acute ischemic VBA thalamic stroke [434.91] Chronic anticoagulation on coumadin [V58.61]      Anticoagulation Episode Summary   [facility] check location [facility]   Preferred lab    Send INR reminders to    Comments       PATIENT INSTRUCTIONS: Patient Instructions   Anti-Coagulation Progress Note  Patrick [name] is a 68 y.o. male who is currently on an anti-coagulation regimen.    RECENT RESULTS: Recent results are below, the most recent result is correlated with a dose of 32.5 mg. per week: Lab Results  Component Value Date   INR 2.60 12/07/2013   INR 4.00 11/16/2013   INR 3.00 10/19/2013    ANTI-COAG DOSE: Anticoagulation Dose Instructions as of 12/07/2013     [name] Tue Wed Thu Fri Sat   New  Dose 5 mg 2.5 mg 5 mg 5 mg 5 mg 5 mg 5 mg       ANTICOAG SUMMARY: @ANTICOAGSUMMARY @  ANTICOAG TODAY: @ANTICOAGTODAY @  PATIENT INSTRUCTIONS: @PATINSTR @   FOLLOW-UP No Follow-up on file.  [name], III Pharm.D., CACP      FOLLOW-UP Return in 4 weeks (on 01/04/2014) for Follow up INR at 3:30PM.  [name], III Pharm.D., CACP

## 2013-12-08 ENCOUNTER — Other Ambulatory Visit: Payer: Self-pay | Admitting: Internal Medicine

## 2013-12-08 NOTE — Progress Notes (Signed)
Indication: Atrial fibrillation. Duration: Lifelong. INR: At target. No dose adjustment required.

## 2014-01-04 ENCOUNTER — Ambulatory Visit (INDEPENDENT_AMBULATORY_CARE_PROVIDER_SITE_OTHER): Payer: Medicare Other | Admitting: Pharmacist

## 2014-01-04 DIAGNOSIS — I6322 Cerebral infarction due to unspecified occlusion or stenosis of basilar arteries: Secondary | ICD-10-CM

## 2014-01-04 DIAGNOSIS — I6350 Cerebral infarction due to unspecified occlusion or stenosis of unspecified cerebral artery: Secondary | ICD-10-CM

## 2014-01-04 DIAGNOSIS — I4891 Unspecified atrial fibrillation: Secondary | ICD-10-CM

## 2014-01-04 DIAGNOSIS — IMO0002 Reserved for concepts with insufficient information to code with codable children: Secondary | ICD-10-CM | POA: Diagnosis not present

## 2014-01-04 DIAGNOSIS — I63219 Cerebral infarction due to unspecified occlusion or stenosis of unspecified vertebral arteries: Secondary | ICD-10-CM

## 2014-01-04 DIAGNOSIS — Z7901 Long term (current) use of anticoagulants: Secondary | ICD-10-CM

## 2014-01-04 DIAGNOSIS — E1165 Type 2 diabetes mellitus with hyperglycemia: Secondary | ICD-10-CM | POA: Diagnosis not present

## 2014-01-04 DIAGNOSIS — I6381 Other cerebral infarction due to occlusion or stenosis of small artery: Secondary | ICD-10-CM

## 2014-01-04 LAB — POCT INR: INR: 3

## 2014-01-04 NOTE — Progress Notes (Signed)
Anti-Coagulation Progress Note  Hani Bladow is a 68 y.o. male who is currently on an anti-coagulation regimen.    RECENT RESULTS: Recent results are below, the most recent result is correlated with a dose of 32.5 mg. per week: Lab Results  Component Value Date   INR 3.00 01/04/2014   INR 2.60 12/07/2013   INR 4.00 11/16/2013    ANTI-COAG DOSE: Anticoagulation Dose Instructions as of 01/04/2014     [name] Tue Wed Thu Fri Sat   New Dose 5 mg 2.5 mg 5 mg 5 mg 2.5 mg 5 mg 5 mg       ANTICOAG SUMMARY: Anticoagulation Episode Summary   Current INR goal 2.0-3.0  Next INR check 02/01/2014  INR from last check 3.00 (01/04/2014)  Weekly max dose   Target end date Indefinite  [facility]  Preferred lab   Send INR reminders to    Indications  ATRIAL FIBRILLATION PAROXYSMAL CHRONIC (Resolved) [427.31] Acute ischemic VBA thalamic stroke [434.91] Chronic anticoagulation on coumadin [V58.61]        Comments         ANTICOAG TODAY: Anticoagulation Summary as of 01/04/2014   INR goal 2.0-3.0  Selected INR 3.00 (01/04/2014)  Next INR check 02/01/2014  Target end date Indefinite   Indications  ATRIAL FIBRILLATION PAROXYSMAL CHRONIC (Resolved) [427.31] Acute ischemic VBA thalamic stroke [434.91] Chronic anticoagulation on coumadin [V58.61]      Anticoagulation Episode Summary   [facility] check location [facility]   Preferred lab    Send INR reminders to    Comments       PATIENT INSTRUCTIONS: Patient Instructions  Patient instructed to take medications as defined in the Anti-coagulation Track section of this encounter.  Patient instructed to take today's dose.  Patient verbalized understanding of these instructions.       FOLLOW-UP Return in 4 weeks (on 02/01/2014) for Follow up INR at 1530h.  [name], III Pharm.D., CACP

## 2014-01-04 NOTE — Patient Instructions (Signed)
Patient instructed to take medications as defined in the Anti-coagulation Track section of this encounter.  Patient instructed to take today's dose.  Patient verbalized understanding of these instructions.    

## 2014-02-01 ENCOUNTER — Other Ambulatory Visit: Payer: Self-pay | Admitting: Internal Medicine

## 2014-02-01 ENCOUNTER — Ambulatory Visit (INDEPENDENT_AMBULATORY_CARE_PROVIDER_SITE_OTHER): Payer: Medicare Other | Admitting: Pharmacist

## 2014-02-01 DIAGNOSIS — N183 Chronic kidney disease, stage 3 unspecified: Secondary | ICD-10-CM | POA: Diagnosis not present

## 2014-02-01 DIAGNOSIS — I6350 Cerebral infarction due to unspecified occlusion or stenosis of unspecified cerebral artery: Secondary | ICD-10-CM

## 2014-02-01 DIAGNOSIS — I6322 Cerebral infarction due to unspecified occlusion or stenosis of basilar arteries: Secondary | ICD-10-CM

## 2014-02-01 DIAGNOSIS — Z7901 Long term (current) use of anticoagulants: Secondary | ICD-10-CM

## 2014-02-01 DIAGNOSIS — I4891 Unspecified atrial fibrillation: Secondary | ICD-10-CM | POA: Diagnosis not present

## 2014-02-01 DIAGNOSIS — E119 Type 2 diabetes mellitus without complications: Secondary | ICD-10-CM | POA: Diagnosis not present

## 2014-02-01 DIAGNOSIS — Z5181 Encounter for therapeutic drug level monitoring: Secondary | ICD-10-CM | POA: Diagnosis not present

## 2014-02-01 DIAGNOSIS — I6381 Other cerebral infarction due to occlusion or stenosis of small artery: Secondary | ICD-10-CM

## 2014-02-01 DIAGNOSIS — E1165 Type 2 diabetes mellitus with hyperglycemia: Secondary | ICD-10-CM | POA: Diagnosis not present

## 2014-02-01 DIAGNOSIS — IMO0002 Reserved for concepts with insufficient information to code with codable children: Secondary | ICD-10-CM | POA: Diagnosis not present

## 2014-02-01 DIAGNOSIS — I129 Hypertensive chronic kidney disease with stage 1 through stage 4 chronic kidney disease, or unspecified chronic kidney disease: Secondary | ICD-10-CM | POA: Diagnosis not present

## 2014-02-01 DIAGNOSIS — I10 Essential (primary) hypertension: Secondary | ICD-10-CM | POA: Diagnosis not present

## 2014-02-01 DIAGNOSIS — R109 Unspecified abdominal pain: Secondary | ICD-10-CM | POA: Diagnosis not present

## 2014-02-01 DIAGNOSIS — I63219 Cerebral infarction due to unspecified occlusion or stenosis of unspecified vertebral arteries: Secondary | ICD-10-CM

## 2014-02-01 LAB — POCT INR
INR: 2.6
INR: 2.6

## 2014-02-01 NOTE — Progress Notes (Signed)
Anti-Coagulation Progress Note  Patrick [name] is a 68 y.o. male who is currently on an anti-coagulation regimen.    RECENT RESULTS: Recent results are below, the most recent result is correlated with a dose of 30 mg. per week: Lab Results  Component Value Date   INR 2.60 02/01/2014   INR 2.60 02/01/2014   INR 3.00 01/04/2014    ANTI-COAG DOSE: Anticoagulation Dose Instructions as of 02/01/2014     Sun Mon Tue Wed Thu Fri Sat   New Dose 5 mg 2.5 mg 5 mg 5 mg 2.5 mg 5 mg 5 mg       ANTICOAG SUMMARY: Anticoagulation Episode Summary   Current INR goal 2.0-3.0  Next INR check 03/08/2014  INR from last check 2.60 (02/01/2014)  Weekly max dose   Target end date Indefinite  [facility] check location [facility]  Preferred lab   Send INR reminders to    Indications  ATRIAL FIBRILLATION PAROXYSMAL CHRONIC (Resolved) [427.31] Acute ischemic VBA thalamic stroke [434.91] Chronic anticoagulation on coumadin [V58.61]        Comments         ANTICOAG TODAY: Anticoagulation Summary as of 02/01/2014   INR goal 2.0-3.0  Selected INR 2.60 (02/01/2014)  Next INR check 03/08/2014  Target end date Indefinite   Indications  ATRIAL FIBRILLATION PAROXYSMAL CHRONIC (Resolved) [427.31] Acute ischemic VBA thalamic stroke [434.91] Chronic anticoagulation on coumadin [V58.61]      Anticoagulation Episode Summary   [facility] check location [facility]   Preferred lab    Send INR reminders to    Comments       PATIENT INSTRUCTIONS: Patient Instructions  Patient instructed to take medications as defined in the Anti-coagulation Track section of this encounter.  Patient instructed to take today's dose.  Patient verbalized understanding of these instructions.       FOLLOW-UP Return in 5 weeks (on 03/08/2014) for Follow up INR at 4:15pm.  [name], III Pharm.D., CACP

## 2014-02-01 NOTE — Patient Instructions (Signed)
Patient instructed to take medications as defined in the Anti-coagulation Track section of this encounter.  Patient instructed to take today's dose.  Patient verbalized understanding of these instructions.    

## 2014-02-02 NOTE — Progress Notes (Signed)
INTERNAL MEDICINE TEACHING ATTENDING ADDENDUM - Aldine Contes M.D  Duration- indefinite, Indication- afib, CVA, INR- therapeutic. Agree with Dr. Gladstone Pih recommendations as outlined in his note.

## 2014-03-08 ENCOUNTER — Ambulatory Visit (INDEPENDENT_AMBULATORY_CARE_PROVIDER_SITE_OTHER): Payer: Medicare Other | Admitting: Dietician

## 2014-03-08 ENCOUNTER — Ambulatory Visit (INDEPENDENT_AMBULATORY_CARE_PROVIDER_SITE_OTHER): Payer: Medicare Other | Admitting: Pharmacist

## 2014-03-08 DIAGNOSIS — Z7901 Long term (current) use of anticoagulants: Secondary | ICD-10-CM | POA: Diagnosis not present

## 2014-03-08 DIAGNOSIS — E119 Type 2 diabetes mellitus without complications: Secondary | ICD-10-CM

## 2014-03-08 DIAGNOSIS — I6350 Cerebral infarction due to unspecified occlusion or stenosis of unspecified cerebral artery: Secondary | ICD-10-CM | POA: Diagnosis not present

## 2014-03-08 DIAGNOSIS — I6381 Other cerebral infarction due to occlusion or stenosis of small artery: Secondary | ICD-10-CM

## 2014-03-08 DIAGNOSIS — I63219 Cerebral infarction due to unspecified occlusion or stenosis of unspecified vertebral arteries: Secondary | ICD-10-CM

## 2014-03-08 DIAGNOSIS — I6322 Cerebral infarction due to unspecified occlusion or stenosis of basilar arteries: Secondary | ICD-10-CM

## 2014-03-08 LAB — POCT INR: INR: 2.8

## 2014-03-08 LAB — HM DIABETES EYE EXAM

## 2014-03-08 NOTE — Progress Notes (Signed)
Anti-Coagulation Progress Note  Patrick [name] is a 68 y.o. male who is currently on an anti-coagulation regimen.    RECENT RESULTS: Recent results are below, the most recent result is correlated with a dose of 30 mg. per week: Lab Results  Component Value Date   INR 2.80 03/08/2014   INR 2.60 02/01/2014   INR 2.60 02/01/2014    ANTI-COAG DOSE: Anticoagulation Dose Instructions as of 03/08/2014     Patrick [name] Tue Wed Thu Fri Sat   New Dose 5 mg 2.5 mg 5 mg 5 mg 2.5 mg 5 mg 5 mg       ANTICOAG SUMMARY: Anticoagulation Episode Summary   Current INR goal 2.0-3.0  Next INR check 03/29/2014  INR from last check 2.80 (03/08/2014)  Weekly max dose   Target end date Indefinite  [facility] check location [facility]  Preferred lab   Send INR reminders to    Indications  ATRIAL FIBRILLATION PAROXYSMAL CHRONIC (Resolved) [427.31] Acute ischemic VBA thalamic stroke [434.91] Chronic anticoagulation on coumadin [V58.61]        Comments         ANTICOAG TODAY: Anticoagulation Summary as of 03/08/2014   INR goal 2.0-3.0  Selected INR 2.80 (03/08/2014)  Next INR check 03/29/2014  Target end date Indefinite   Indications  ATRIAL FIBRILLATION PAROXYSMAL CHRONIC (Resolved) [427.31] Acute ischemic VBA thalamic stroke [434.91] Chronic anticoagulation on coumadin [V58.61]      Anticoagulation Episode Summary   [facility] check location [facility]   Preferred lab    Send INR reminders to    Comments       PATIENT INSTRUCTIONS: Patient Instructions  Patient instructed to take medications as defined in the Anti-coagulation Track section of this encounter.  Patient instructed to take today's dose.  Patient verbalized understanding of these instructions.       FOLLOW-UP Return in 3 weeks (on 03/29/2014) for Follow up INR at 3:45pm.  [name], III Pharm.D., CACP

## 2014-03-08 NOTE — Patient Instructions (Signed)
Patient instructed to take medications as defined in the Anti-coagulation Track section of this encounter.  Patient instructed to take today's dose.  Patient verbalized understanding of these instructions.    

## 2014-03-08 NOTE — Addendum Note (Signed)
Addended by: Truddie Crumble on: 03/08/2014 04:58 PM   Modules accepted: Orders

## 2014-03-08 NOTE — Progress Notes (Signed)
Retinal images taken and transmitted.

## 2014-03-08 NOTE — Addendum Note (Signed)
Addended by: Larey Dresser A on: 03/08/2014 04:46 PM   Modules accepted: Orders

## 2014-03-11 ENCOUNTER — Other Ambulatory Visit: Payer: Self-pay | Admitting: Pulmonary Disease

## 2014-03-11 ENCOUNTER — Other Ambulatory Visit: Payer: Self-pay | Admitting: *Deleted

## 2014-03-11 DIAGNOSIS — IMO0002 Reserved for concepts with insufficient information to code with codable children: Secondary | ICD-10-CM

## 2014-03-11 DIAGNOSIS — E1165 Type 2 diabetes mellitus with hyperglycemia: Secondary | ICD-10-CM

## 2014-03-11 DIAGNOSIS — E118 Type 2 diabetes mellitus with unspecified complications: Principal | ICD-10-CM

## 2014-03-11 MED ORDER — POTASSIUM CHLORIDE ER 10 MEQ PO CPCR: 10.0000 meq | ORAL_CAPSULE | Freq: Two times a day (BID) | ORAL | Status: DC

## 2014-03-11 NOTE — Telephone Encounter (Signed)
Last BMP 2014. ACE I increased last visit but did not return for F/U. Needs appt next 2 weeks.

## 2014-03-26 ENCOUNTER — Other Ambulatory Visit: Payer: Self-pay | Admitting: *Deleted

## 2014-03-26 MED ORDER — ALLOPURINOL 100 MG PO TABS: 200.0000 mg | ORAL_TABLET | Freq: Every day | ORAL | Status: DC

## 2014-03-29 ENCOUNTER — Ambulatory Visit (INDEPENDENT_AMBULATORY_CARE_PROVIDER_SITE_OTHER): Payer: Medicare Other | Admitting: Internal Medicine

## 2014-03-29 ENCOUNTER — Ambulatory Visit: Payer: Medicare Other | Admitting: Internal Medicine

## 2014-03-29 ENCOUNTER — Ambulatory Visit (INDEPENDENT_AMBULATORY_CARE_PROVIDER_SITE_OTHER): Payer: Medicare Other | Admitting: Pharmacist

## 2014-03-29 ENCOUNTER — Encounter: Payer: Self-pay | Admitting: Internal Medicine

## 2014-03-29 ENCOUNTER — Ambulatory Visit: Payer: Medicare Other

## 2014-03-29 VITALS — BP 159/103 | HR 84 | Ht 69.0 in | Wt 205.1 lb

## 2014-03-29 DIAGNOSIS — M109 Gout, unspecified: Secondary | ICD-10-CM

## 2014-03-29 DIAGNOSIS — I6350 Cerebral infarction due to unspecified occlusion or stenosis of unspecified cerebral artery: Secondary | ICD-10-CM | POA: Diagnosis not present

## 2014-03-29 DIAGNOSIS — Z23 Encounter for immunization: Secondary | ICD-10-CM

## 2014-03-29 DIAGNOSIS — IMO0002 Reserved for concepts with insufficient information to code with codable children: Secondary | ICD-10-CM | POA: Diagnosis not present

## 2014-03-29 DIAGNOSIS — Z7901 Long term (current) use of anticoagulants: Secondary | ICD-10-CM | POA: Diagnosis not present

## 2014-03-29 DIAGNOSIS — I4821 Permanent atrial fibrillation: Secondary | ICD-10-CM

## 2014-03-29 DIAGNOSIS — E785 Hyperlipidemia, unspecified: Secondary | ICD-10-CM

## 2014-03-29 DIAGNOSIS — I63219 Cerebral infarction due to unspecified occlusion or stenosis of unspecified vertebral arteries: Secondary | ICD-10-CM

## 2014-03-29 DIAGNOSIS — D649 Anemia, unspecified: Secondary | ICD-10-CM

## 2014-03-29 DIAGNOSIS — Z0000 Encounter for general adult medical examination without abnormal findings: Secondary | ICD-10-CM

## 2014-03-29 DIAGNOSIS — I10 Essential (primary) hypertension: Secondary | ICD-10-CM

## 2014-03-29 DIAGNOSIS — E1165 Type 2 diabetes mellitus with hyperglycemia: Secondary | ICD-10-CM

## 2014-03-29 DIAGNOSIS — E118 Type 2 diabetes mellitus with unspecified complications: Principal | ICD-10-CM

## 2014-03-29 DIAGNOSIS — D539 Nutritional anemia, unspecified: Secondary | ICD-10-CM | POA: Diagnosis not present

## 2014-03-29 DIAGNOSIS — I6381 Other cerebral infarction due to occlusion or stenosis of small artery: Secondary | ICD-10-CM

## 2014-03-29 DIAGNOSIS — I6322 Cerebral infarction due to unspecified occlusion or stenosis of basilar arteries: Secondary | ICD-10-CM

## 2014-03-29 DIAGNOSIS — I4891 Unspecified atrial fibrillation: Secondary | ICD-10-CM

## 2014-03-29 LAB — POCT INR: INR: 3.6

## 2014-03-29 LAB — COMPLETE METABOLIC PANEL WITH GFR
ALK PHOS: 91 U/L (ref 39–117)
ALT: 31 U/L (ref 0–53)
AST: 26 U/L (ref 0–37)
Albumin: 3.7 g/dL (ref 3.5–5.2)
BILIRUBIN TOTAL: 0.4 mg/dL (ref 0.2–1.2)
BUN: 37 mg/dL — ABNORMAL HIGH (ref 6–23)
CO2: 27 meq/L (ref 19–32)
CREATININE: 2.33 mg/dL — AB (ref 0.50–1.35)
Calcium: 8.7 mg/dL (ref 8.4–10.5)
Chloride: 104 mEq/L (ref 96–112)
GFR, EST NON AFRICAN AMERICAN: 28 mL/min — AB
GFR, Est African American: 32 mL/min — ABNORMAL LOW
GLUCOSE: 230 mg/dL — AB (ref 70–99)
Potassium: 4 mEq/L (ref 3.5–5.3)
SODIUM: 140 meq/L (ref 135–145)
TOTAL PROTEIN: 6.2 g/dL (ref 6.0–8.3)

## 2014-03-29 LAB — LIPID PANEL
CHOL/HDL RATIO: 2.6 ratio
Cholesterol: 121 mg/dL (ref 0–200)
HDL: 46 mg/dL (ref >39)
LDL Cholesterol: 33 mg/dL (ref 0–99)
Triglycerides: 208 mg/dL — ABNORMAL HIGH (ref <150)
VLDL: 42 mg/dL — AB (ref 0–40)

## 2014-03-29 LAB — GLUCOSE, CAPILLARY: Glucose-Capillary: 302 mg/dL — ABNORMAL HIGH (ref 70–99)

## 2014-03-29 LAB — POCT GLYCOSYLATED HEMOGLOBIN (HGB A1C): HEMOGLOBIN A1C: 9.3

## 2014-03-29 LAB — URIC ACID: Uric Acid, Serum: 6.2 mg/dL (ref 4.0–7.8)

## 2014-03-29 MED ORDER — GLIPIZIDE ER 5 MG PO TB24: ORAL_TABLET | ORAL | Status: DC

## 2014-03-29 MED ORDER — METFORMIN HCL 500 MG PO TABS: 500.0000 mg | ORAL_TABLET | Freq: Two times a day (BID) | ORAL | Status: DC

## 2014-03-29 NOTE — Patient Instructions (Signed)
-  Take 5 mg of glipizide instead of 10 mg daily because of low blood sugar and take it with meals -Start taking metformin 500 mg twice a day for diabetes -Will check your lab work today -Please come back in 1 month and bring your glucose meter, pleasure meeting you!   General Instructions:   Please bring your medicines with you each time you come to clinic.  Medicines may include prescription medications, over-the-counter medications, herbal remedies, eye drops, vitamins, or other pills.   Progress Toward Treatment Goals:  Treatment Goal 10/12/2013  Hemoglobin A1C improved  Blood pressure deteriorated    Self Care Goals & Plans:  Self Care Goal 03/29/2014  Manage my medications take my medicines as prescribed; bring my medications to every visit; refill my medications on time; follow the sick day instructions if I am sick  Monitor my health keep track of my blood glucose; keep track of my blood pressure; check my feet daily  Eat healthy foods eat more vegetables; eat fruit for snacks and desserts; eat foods that are low in salt; eat baked foods instead of fried foods; eat smaller portions; drink diet soda or water instead of juice or soda  Be physically active find an activity I enjoy    Home Blood Glucose Monitoring 10/12/2013  Check my blood sugar 2 times a day  When to check my blood sugar before breakfast; before dinner     Care Management & Community Referrals:  Referral 10/12/2013  Referrals made for care management support none needed

## 2014-03-29 NOTE — Progress Notes (Signed)
Patient ID: Patrick [name], male   DOB: 1945-12-30, 68 y.o.   MRN: [id number]    Subjective:   Patient ID: Patrick [name] male   DOB: January 17, 1946 68 y.o.   MRN: [id number]  HPI: [name] is a 68 y.o. man with past medical history of hypertension, hyperlipidemia, CAD,CVA with residual memory loss, insulin-dependent Type II DM, atrial fibrillation on coumadin, tachy-brady syndrome, CKD Stage 3, gout, depression, OSA, and GERD who presents for routine clinic visit.   Due to past history of stroke he has poor memory and unsure of what medications he is exactly taking. His wife is unfortunately not present.  His last A1c was 7.1 on 10/12/13. He checks his blood sugar daily but forgot to bring his meter in today. He in unsure how much insulin he is taking (at last visit was instructed to take NPH 20 U AM & 10 U PM) and seems to be taking glipizide 10 mg daily. He has never been on metformin in the past. He reports his average values are in the 100's with occasional symptomatic hypoglycemia. He has chronic polydipsia, polyuria, polyphagia, and neuropathy but denies blurry vision or foot injury/ulcer. He has a poor diet and does not exercise. His weight has been stable.   He is supposed to be taking clonidine, diltiazem, hydralazine, lisinopril, and furosemide for hypertension. He denies headache, chest pain, LE swelling, or lightheadedness.   He denies recent gout flare and is compliant with taking allopurinol daily.   He reports compliance with taking pravastatin daily and denies muscle cramping or pain.   He is compliant with taking coumadin for atrial fibrillation and denies recent bleeding.    Past Medical History  Diagnosis Date  . Hypertension   . CKD (chronic kidney disease), stage II   . CAD (coronary artery disease)   . Depression   . Gout   . Hyperlipidemia   . 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 GERD (gastroesophageal reflux  disease)   . Diabetes mellitus type II     Insulin dependent  . Upper GI bleeding 07/01/2013  . Paroxysmal atrial fibrillation     on coumadin  . Atrial fibrillation   . Myocardial infarction 2000's    "near heart attack" (07/14/2013)  . Shortness of breath     "can happen at any time" (07/14/2013)  . Sleep apnea 07/2010    "has mask at home; seldom uses it" (07/14/2013)  . Stomach ulcer 1950's    "as a teenager"  . Stroke ~ 2007; ~ 2009    "memory not as good since" (07/14/2013)  . Permanent atrial fibrillation 07/17/2013  . Chronic anticoagulation, on coumadin 07/17/2013  . Tachy-brady syndrome 07/17/2013   Current Outpatient Prescriptions  Medication Sig Dispense Refill  . allopurinol (ZYLOPRIM) 100 MG tablet Take 2 tablets (200 mg total) by mouth daily.  60 tablet  3  . aspirin 81 MG EC tablet Take 81 mg by mouth daily.        . Blood Glucose Monitoring Suppl (ACCU-CHEK AVIVA PLUS) W/DEVICE KIT 1 each by Does not apply route as needed.  1 kit  0  . cloNIDine (CATAPRES) 0.3 MG tablet TAKE ONE TABLET BY MOUTH TWICE DAILY  180 tablet  6  . diltiazem (CARDIZEM) 30 MG tablet Take 1 tablet (30 mg total) by mouth 3 (three) times daily.  180 tablet  2  . furosemide (LASIX) 20 MG tablet TAKE ONE TABLET BY  MOUTH ONCE DAILY  30 tablet  6  . glipiZIDE (GLUCOTROL XL) 10 MG 24 hr tablet TAKE ONE TABLET BY MOUTH ONCE DAILY  30 tablet  6  . glucose blood (TRUETRACK TEST) test strip Use to test blood sugar 2-3 times daily dx code 250.00  100 each  11  . hydrALAZINE (APRESOLINE) 100 MG tablet TAKE ONE TABLET BY MOUTH EVERY 8 HOURS  90 tablet  2  . insulin NPH (HUMULIN N,NOVOLIN N) 100 UNIT/ML injection Inject 10-20 Units into the skin 2 (two) times daily. Takes 20 units in the morning and 10 units in the evening.      . Insulin Syringe-Needle U-100 (ELITE-THIN INS SYR .5CC/31G) 31G X 5/16" 0.5 ML MISC 1 each by Does not apply route 3 (three) times daily. Use to inject insulin daily  100 each  11  .  Lancets 30G MISC Use to test blood glucose 2-3 times daily dx code 250.00  100 each  11  . lisinopril (PRINIVIL,ZESTRIL) 10 MG tablet TAKE ONE TABLET BY MOUTH ONCE DAILY  30 tablet  3  . potassium chloride (MICRO-K) 10 MEQ CR capsule Take 1 capsule (10 mEq total) by mouth 2 (two) times daily.  180 capsule  0  . pravastatin (PRAVACHOL) 80 MG tablet TAKE ONE TABLET BY MOUTH ONCE DAILY  30 tablet  6  . warfarin (COUMADIN) 5 MG tablet TAKE ONE-HALF TO ONE TABLET (2.5-5 MG TOTAL) BY MOUTH ONCE DAILY. 2.5MG MONDAY AND FRIDAY, 5MG THE REST OF THE WEEK  30 tablet  1   No current facility-administered medications for this visit.   No family history on file. History   Social History  . Marital Status: Married    [name]    Number of Children: N/A  . Years of Education: 5   Occupational History  .     Social History Main Topics  . Smoking status: Former Smoker -- 0.50 packs/day for 15 years    Types: Cigarettes    Quit date: 10/26/1976  . Smokeless tobacco: Former Systems developer     Comment: 07/14/2013 "quit chewing and dipping in ~ 1970"  . Alcohol Use: Yes     Comment: 07/14/2013 "drank a little; quit in ~ 1970; never had problem w/it"  . Drug Use: No  . Sexual Activity: Not Currently   Other Topics Concern  . None   Social History Narrative  . None   Review of Systems: Review of Systems  Constitutional: Negative for fever, chills and weight loss.  Eyes: Negative for blurred vision.  Respiratory: Negative for cough, shortness of breath and wheezing.   Cardiovascular: Negative for chest pain and leg swelling.  Gastrointestinal: Negative for nausea, vomiting, abdominal pain, diarrhea and constipation.  Genitourinary: Negative for dysuria, urgency and hematuria.       Chronic polyuria  Musculoskeletal: Negative for myalgias.  Neurological: Positive for sensory change (chronic neuropathy). Negative for dizziness and headaches.  Endo/Heme/Allergies: Positive for polydipsia (chronic).     Objective:  Physical Exam: Filed Vitals:   03/29/14 1439  Height: '5\' 9"'  (1.753 m)  Weight: 205 lb 1.6 oz (93.033 kg)   Physical Exam  Constitutional: He is oriented to person, place, and time. He appears well-developed and well-nourished. No distress.  HENT:  Head: Normocephalic and atraumatic.  Right Ear: External ear normal.  Left Ear: External ear normal.  Nose: Nose normal.  Mouth/Throat: Oropharynx is clear and moist. No oropharyngeal exudate.  Eyes: Conjunctivae and EOM are normal. Pupils  are equal, round, and reactive to light. Right eye exhibits no discharge. Left eye exhibits no discharge. No scleral icterus.  Neck: Normal range of motion. Neck supple.  Cardiovascular: Normal rate.   Irregularly irregular rhythm  Pulmonary/Chest: Effort normal and breath sounds normal. No respiratory distress. He has no wheezes. He has no rales.  Abdominal: Soft. Bowel sounds are normal. He exhibits no distension. There is no tenderness. There is no rebound and no guarding.  Musculoskeletal: Normal range of motion. He exhibits no edema and no tenderness.  Neurological: He is alert and oriented to person, place, and time.  Slow to respond to questions  Skin: Skin is warm and dry. No rash noted. He is not diaphoretic. No erythema. No pallor.  Psychiatric: He has a normal mood and affect. His behavior is normal. Judgment and thought content normal.    Assessment & Plan:   Please see problem list for problem-based assessment and plan

## 2014-03-29 NOTE — Patient Instructions (Signed)
Patient instructed to take medications as defined in the Anti-coagulation Track section of this encounter.  Patient instructed to take today's dose.  Patient verbalized understanding of these instructions.    

## 2014-03-29 NOTE — Progress Notes (Signed)
Anti-Coagulation Progress Note  Patrick [name] is a 68 y.o. male who is currently on an anti-coagulation regimen.    RECENT RESULTS: Recent results are below, the most recent result is correlated with a dose of 30 mg. per week: Lab Results  Component Value Date   INR 3.60 03/29/2014   INR 2.80 03/08/2014   INR 2.60 02/01/2014    ANTI-COAG DOSE: Anticoagulation Dose Instructions as of 03/29/2014     [name] Tue Wed Thu Fri Sat   New Dose 5 mg 2.5 mg 5 mg 2.5 mg 5 mg 2.5 mg 5 mg       ANTICOAG SUMMARY: Anticoagulation Episode Summary   Current INR goal 2.0-3.0  Next INR check 04/19/2014  INR from last check 3.60! (03/29/2014)  Weekly max dose   Target end date Indefinite  [facility] location [facility]  Preferred lab   Send INR reminders to    Indications  ATRIAL FIBRILLATION PAROXYSMAL CHRONIC (Resolved) [427.31] Acute ischemic VBA thalamic stroke [434.91] Chronic anticoagulation on coumadin [V58.61]        Comments         ANTICOAG TODAY: Anticoagulation Summary as of 03/29/2014   INR goal 2.0-3.0  Selected INR 3.60! (03/29/2014)  Next INR check 04/19/2014  Target end date Indefinite   Indications  ATRIAL FIBRILLATION PAROXYSMAL CHRONIC (Resolved) [427.31] Acute ischemic VBA thalamic stroke [434.91] Chronic anticoagulation on coumadin [V58.61]      Anticoagulation Episode Summary   [facility] check location [facility]   Preferred lab    Send INR reminders to    Comments       PATIENT INSTRUCTIONS: Patient Instructions  Patient instructed to take medications as defined in the Anti-coagulation Track section of this encounter.  Patient instructed to take today's dose.  Patient verbalized understanding of these instructions.       FOLLOW-UP Return in 3 weeks (on 04/19/2014) for Follow up INR at 2:15PM.  [name], III Pharm.D., CACP

## 2014-03-30 LAB — CBC
HCT: 37.7 % — ABNORMAL LOW (ref 39.0–52.0)
HEMOGLOBIN: 12.2 g/dL — AB (ref 13.0–17.0)
MCH: 25.6 pg — ABNORMAL LOW (ref 26.0–34.0)
MCHC: 32.4 g/dL (ref 30.0–36.0)
MCV: 79.2 fL (ref 78.0–100.0)
Platelets: 169 10*3/uL (ref 150–400)
RBC: 4.76 MIL/uL (ref 4.22–5.81)
RDW: 15.1 % (ref 11.5–15.5)
WBC: 4.9 10*3/uL (ref 4.0–10.5)

## 2014-03-30 LAB — ANEMIA PANEL
%SAT: 16 % — ABNORMAL LOW (ref 20–55)
ABS Retic: 47.6 10*3/uL (ref 19.0–186.0)
Ferritin: 99 ng/mL (ref 22–322)
Folate: 12.7 ng/mL
IRON: 36 ug/dL — AB (ref 42–165)
RBC.: 4.76 MIL/uL (ref 4.22–5.81)
RETIC CT PCT: 1 % (ref 0.4–2.3)
TIBC: 220 ug/dL (ref 215–435)
UIBC: 184 ug/dL (ref 125–400)
VITAMIN B 12: 339 pg/mL (ref 211–911)

## 2014-03-31 DIAGNOSIS — D649 Anemia, unspecified: Secondary | ICD-10-CM | POA: Insufficient documentation

## 2014-03-31 DIAGNOSIS — Z0000 Encounter for general adult medical examination without abnormal findings: Secondary | ICD-10-CM | POA: Insufficient documentation

## 2014-03-31 NOTE — Assessment & Plan Note (Addendum)
Assessment: Pt with permanent atrial fibrillation on rate control and AC therapy who presents with normal rate and irregularly irregular rhythm.    Plan: -Obtain POCT INR ----> 3.6 supra therapeutic  -Continue coumadin per Dr. Gladstone Pih instructions today for [facility] therapy  -Continue diltiazem 30 mg TID for rate control

## 2014-03-31 NOTE — Assessment & Plan Note (Addendum)
Assessment: Pt with last A1c of 7.1 on 10/12/13 with questionable compliance with insulin and oral hypoglycemic therapy with recent symptomatic hypoglycemia who presents with CBG of 302 and worsened A1c of 9.3.   Plan:  -A1c 9.3 not at goal <7, start metformin 500 mg BID, decrease glipizide 10 mg to 5 mg daily due to hypoglycemic episodes, and continue insulin NPH 20 U AM & 10 U PM. Pt instructed to bring glucose meter at next visit in 1 month for insulin adjustment if necessary.  -BP 159/103 not at goal <140/90, unclear compliance, continue clonidine 0.3 mg BID, diltiazem 30 mg TID, hydralazine 100 mg TID, lisinopril 10 mg daily, and furosemide 20 mg daily -Obtain annual lipid panel, LDL 33 at goal <100, continue pravastatin 80 mg daily  -Last annual eye exam on 03/08/14 with NPDR, pt already referred to opthalmology  -Perform annual foot exam today -Last annual urine microalbumin on 10/27/10 with proteinuria, consider obtaining at next visit to assess for effectiveness of ACEi therapy -BMI 30 at goal <30 -Continue aspirin 81 mg for secondary CVD prevention

## 2014-03-31 NOTE — Assessment & Plan Note (Signed)
Assessment: Pt with non-tophaceous gout compliant with uric acid lowering therapy who presents with no recent flare.   Plan: -Obtain uric acid level ---> 6.2 -Continue allopurinol 200 mg daily

## 2014-03-31 NOTE — Progress Notes (Signed)
Indication: Atrial fibrillation. Duration: Lifelong. INR: Above target. Agree with Dr. Gladstone Pih assessment and plan.

## 2014-03-31 NOTE — Assessment & Plan Note (Signed)
Assessment: Pt with last lipid panel on 02/12/13 that was normal with 10-yr ASCVD risk of 38.1 % with recommendations to continue moderate intensity statin therapy.  Plan:  -Obtain annual lipid panel  -Continue pravastatin 80 mg daily -Obtain CMP ---> normal liver function  -Monitor for myalgias

## 2014-03-31 NOTE — Assessment & Plan Note (Signed)
Pt received annual influenza vaccination today on 03/29/14.  

## 2014-03-31 NOTE — Assessment & Plan Note (Signed)
Assessment: Pt with poorly controlled hypertension with unclear compliance with five-class (CCB, alpha-2 agonist, nitrate, ACEi, & diuretic) anti-hypertensive therapy who presents with blood pressure of 159/103.   Plan:  -BP 159/103 not at goal <140/90 -Unclear compliance, continue clonidine 0.3 mg BID, diltiazem 30 mg TID, hydralazine 100 mg TID, lisinopril 10 mg daily, and furosemide 20 mg daily -Obtain CMP ---> Cr 2.33 in setting of CKD Stage 3 with unclear baseline -Consider obtaining renin and aldosterone levels to assess for primary hyperaldosteronism due to history of chronic hypokalemia and refractory hypertension

## 2014-03-31 NOTE — Assessment & Plan Note (Addendum)
Assessment: Pt with CKD Stage 3 with chronic microcytic to normocytic anemia with baseline Hg 11,  last anemia panel on 04/04/12 consistent with ACD, and colonoscopy in 2009 with benign polyps on chronic AC therapy who presents with no active bleeding or hemodynamic instability.   Plan:  -Obtain CBC ----> Hg 12.2 above baseline 11 -Obtain anemia panel ---> mildly low iron (36) and % saturation (16)

## 2014-04-01 NOTE — Progress Notes (Signed)
INTERNAL MEDICINE TEACHING ATTENDING ADDENDUM - Berklie Dethlefs, MD: I reviewed and discussed at the time of visit with the resident Dr. Rabbani, the patient's medical history, physical examination, diagnosis and results of pertinent tests and treatment and I agree with the patient's care as documented.  

## 2014-04-19 ENCOUNTER — Ambulatory Visit (INDEPENDENT_AMBULATORY_CARE_PROVIDER_SITE_OTHER): Payer: Medicare Other | Admitting: Internal Medicine

## 2014-04-19 ENCOUNTER — Ambulatory Visit (INDEPENDENT_AMBULATORY_CARE_PROVIDER_SITE_OTHER): Payer: Medicare Other | Admitting: Pharmacist

## 2014-04-19 VITALS — BP 215/121 | HR 95 | Temp 97.7°F | Ht 68.0 in | Wt 197.5 lb

## 2014-04-19 DIAGNOSIS — I4891 Unspecified atrial fibrillation: Secondary | ICD-10-CM

## 2014-04-19 DIAGNOSIS — I63219 Cerebral infarction due to unspecified occlusion or stenosis of unspecified vertebral arteries: Secondary | ICD-10-CM

## 2014-04-19 DIAGNOSIS — I6381 Other cerebral infarction due to occlusion or stenosis of small artery: Secondary | ICD-10-CM

## 2014-04-19 DIAGNOSIS — I6322 Cerebral infarction due to unspecified occlusion or stenosis of basilar arteries: Secondary | ICD-10-CM

## 2014-04-19 DIAGNOSIS — E119 Type 2 diabetes mellitus without complications: Secondary | ICD-10-CM

## 2014-04-19 DIAGNOSIS — I10 Essential (primary) hypertension: Secondary | ICD-10-CM

## 2014-04-19 DIAGNOSIS — I6350 Cerebral infarction due to unspecified occlusion or stenosis of unspecified cerebral artery: Secondary | ICD-10-CM

## 2014-04-19 DIAGNOSIS — Z7901 Long term (current) use of anticoagulants: Secondary | ICD-10-CM

## 2014-04-19 LAB — POCT INR: INR: 1.6

## 2014-04-19 LAB — GLUCOSE, CAPILLARY: Glucose-Capillary: 91 mg/dL (ref 70–99)

## 2014-04-19 NOTE — Progress Notes (Signed)
Indication: Permanent atrial fibrillation. Duration: Lifelong. INR: Below target. Agree with Dr. Halina Maidens assessment and plan.

## 2014-04-19 NOTE — Patient Instructions (Signed)
Patient instructed to take medications as defined in the Anti-coagulation Track section of this encounter.  Patient instructed to take today's dose.  Patient verbalized understanding of these instructions.    

## 2014-04-19 NOTE — Patient Instructions (Addendum)
General Instructions: I want you to come in to see [name] in 2 weeks with your glucometer. This is very important.  Please check your bloodpressure twice a day- morning and evening. Wait up to 51mins while resting before checking your blood pressure.  Please call if you have any questions about your medications.  Please bring your medicines with you each time you come to clinic.  Medicines may include prescription medications, over-the-counter medications, herbal remedies, eye drops, vitamins, or other pills.

## 2014-04-19 NOTE — Progress Notes (Signed)
Patient ID: Patrick [name], male   DOB: 03-24-1946, 68 y.o.   MRN: [id number]   Subjective:   Patient ID: Patrick [name] male   DOB: 1945-11-17 68 y.o.   MRN: [id number]  HPI: Patrick [name] is a 68 y.o. with PMH listed below, came in today to have his INR checked, and was told he had an appointment today. As per chart he was supposed to come in , 2 weeks from today, he was here 3 weeks prior. Pt therefore was not ready for his appointment, did not know why he was to be seen today, did not bring his glucometer or bp log. Did not bring his medications.   Past Medical History  Diagnosis Date  . Hypertension   . CKD (chronic kidney disease), stage II   . CAD (coronary artery disease)   . Depression   . Gout   . Hyperlipidemia   . 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 GERD (gastroesophageal reflux disease)   . Diabetes mellitus type II     Insulin dependent  . Upper GI bleeding 07/01/2013  . Paroxysmal atrial fibrillation     on coumadin  . Atrial fibrillation   . Myocardial infarction 2000's    "near heart attack" (07/14/2013)  . Shortness of breath     "can happen at any time" (07/14/2013)  . Sleep apnea 07/2010    "has mask at home; seldom uses it" (07/14/2013)  . Stomach ulcer 1950's    "as a teenager"  . Stroke ~ 2007; ~ 2009    "memory not as good since" (07/14/2013)  . Permanent atrial fibrillation 07/17/2013  . Chronic anticoagulation, on coumadin 07/17/2013  . Tachy-brady syndrome 07/17/2013   Current Outpatient Prescriptions  Medication Sig Dispense Refill  . allopurinol (ZYLOPRIM) 100 MG tablet Take 2 tablets (200 mg total) by mouth daily.  60 tablet  3  . aspirin 81 MG EC tablet Take 81 mg by mouth daily.        . Blood Glucose Monitoring Suppl (ACCU-CHEK AVIVA PLUS) W/DEVICE KIT 1 each by Does not apply route as needed.  1 kit  0  . cloNIDine (CATAPRES) 0.3 MG tablet TAKE ONE TABLET BY MOUTH TWICE DAILY  180 tablet  6    . diltiazem (CARDIZEM) 30 MG tablet Take 1 tablet (30 mg total) by mouth 3 (three) times daily.  180 tablet  2  . furosemide (LASIX) 20 MG tablet TAKE ONE TABLET BY MOUTH ONCE DAILY  30 tablet  6  . glipiZIDE (GLUCOTROL XL) 5 MG 24 hr tablet TAKE ONE TABLET BY MOUTH ONCE DAILY  30 tablet  6  . glucose blood (TRUETRACK TEST) test strip Use to test blood sugar 2-3 times daily dx code 250.00  100 each  11  . hydrALAZINE (APRESOLINE) 100 MG tablet TAKE ONE TABLET BY MOUTH EVERY 8 HOURS  90 tablet  2  . insulin NPH (HUMULIN N,NOVOLIN N) 100 UNIT/ML injection Inject 10-20 Units into the skin 2 (two) times daily. Takes 20 units in the morning and 10 units in the evening.      . Insulin Syringe-Needle U-100 (ELITE-THIN INS SYR .5CC/31G) 31G X 5/16" 0.5 ML MISC 1 each by Does not apply route 3 (three) times daily. Use to inject insulin daily  100 each  11  . Lancets 30G MISC Use to test blood glucose 2-3 times daily dx code 250.00  100 each  11  .  lisinopril (PRINIVIL,ZESTRIL) 10 MG tablet TAKE ONE TABLET BY MOUTH ONCE DAILY  30 tablet  3  . metFORMIN (GLUCOPHAGE) 500 MG tablet Take 1 tablet (500 mg total) by mouth 2 (two) times daily with a meal.  60 tablet  3  . potassium chloride (MICRO-K) 10 MEQ CR capsule Take 1 capsule (10 mEq total) by mouth 2 (two) times daily.  180 capsule  0  . pravastatin (PRAVACHOL) 80 MG tablet TAKE ONE TABLET BY MOUTH ONCE DAILY  30 tablet  6  . warfarin (COUMADIN) 5 MG tablet TAKE ONE-HALF TO ONE TABLET (2.5-5 MG TOTAL) BY MOUTH ONCE DAILY. 2.5MG MONDAY AND FRIDAY, 5MG THE REST OF THE WEEK  30 tablet  1   No current facility-administered medications for this visit.   No family history on file. History   Social History  . Marital Status: Married    [name]    Number of Children: N/A  . Years of Education: 5   Occupational History  .     Social History Main Topics  . Smoking status: Former Smoker -- 0.50 packs/day for 15 years    Types: Cigarettes     Quit date: 10/26/1976  . Smokeless tobacco: Former Systems developer     Comment: 07/14/2013 "quit chewing and dipping in ~ 1970"  . Alcohol Use: Yes     Comment: 07/14/2013 "drank a little; quit in ~ 1970; never had problem w/it"  . Drug Use: No  . Sexual Activity: Not Currently   Other Topics Concern  . Not on file   Social History Narrative  . No narrative on file   Review of Systems: CONSTITUTIONAL- No Fever, weightloss, night sweat or change in appetite. SKIN- No Rash, colour changes or itching. HEAD- No Headache or dizziness. EYES- No Vision loss, pain, redness, double or blurred vision. RESPIRATORY- No Cough or SOB. CARDIAC- No Palpitations, DOE, PND or chest pain. GI- No nausea, vomiting, diarrhoea, constipation, abd pain. URINARY- No Frequency, urgency, straining or dysuria. NEUROLOGIC- No Numbness, syncope, seizures or burning. [facility]- Denies depression or anxiety.  Objective:  Physical Exam: Filed Vitals:   04/19/14 1506  BP: 215/121  Pulse: 95  Temp: 97.7 F (36.5 C)  TempSrc: Oral  Height: '5\' 8"'  (1.727 m)  Weight: 197 lb 8 oz (89.585 kg)  SpO2: 100%   GENERAL- alert, co-operative, appears as stated age, not in any distress. HEENT- Atraumatic, normocephalic, PERRL, EOMI, oral mucosa appears moist, neck supple. CARDIAC- Irregular, no murmurs, rubs or gallops. RESP- Moving equal volumes of air, and clear to auscultation bilaterally, no wheezes or crackles. ABDOMEN- Soft, nontender, no guarding or rebound, no palpable masses or organomegaly, bowel sounds present. BACK- Normal curvature of the spine, No tenderness along the vertebrae, no CVA tenderness. NEURO- No obvious Cr N abnormality, strenght upper and lower extremities -intact, Gait- Normal. EXTREMITIES- pulse 2+, symmetric, no pedal edema. SKIN- Warm, dry, No rash or lesion. PSYCH- Normal mood and affect, appropriate thought content and speech.  Assessment & Plan:  The patient's case and plan of care was discussed  with attending physician, Dr. Eppie Gibson.  Please see problem based charting for assessment and plan.

## 2014-04-19 NOTE — Progress Notes (Signed)
Anti-Coagulation Progress Note  Patrick [name] is a 68 y.o. male who is currently on an anti-coagulation regimen.    RECENT RESULTS: Recent results are below, the most recent result is correlated with a dose of 27.5 mg. per week: Lab Results  Component Value Date   INR 1.6 04/19/2014   INR 3.60 03/29/2014   INR 2.80 03/08/2014    ANTI-COAG DOSE: Anticoagulation Dose Instructions as of 04/19/2014     [name] Tue Wed Thu Fri Sat   New Dose 5 mg 2.5 mg 5 mg 2.5 mg 5 mg 2.5 mg 5 mg       ANTICOAG SUMMARY: Anticoagulation Episode Summary   Current INR goal 2.0-3.0  Next INR check 05/10/2014  INR from last check 1.6! (04/19/2014)  Weekly max dose   Target end date Indefinite  [facility]  Preferred lab   Send INR reminders to    Indications  ATRIAL FIBRILLATION PAROXYSMAL CHRONIC (Resolved) [427.31] Acute ischemic VBA thalamic stroke [434.91] Chronic anticoagulation on coumadin [V58.61]        Comments         ANTICOAG TODAY: Anticoagulation Summary as of 04/19/2014   INR goal 2.0-3.0  Selected INR 1.6! (04/19/2014)  Next INR check 05/10/2014  Target end date Indefinite   Indications  ATRIAL FIBRILLATION PAROXYSMAL CHRONIC (Resolved) [427.31] Acute ischemic VBA thalamic stroke [434.91] Chronic anticoagulation on coumadin [V58.61]      Anticoagulation Episode Summary   [facility]   Preferred lab    Send INR reminders to    Comments       PATIENT INSTRUCTIONS: Patient Instructions  Patient instructed to take medications as defined in the Anti-coagulation Track section of this encounter.  Patient instructed to take today's dose.  Patient verbalized understanding of these instructions.        FOLLOW-UP No Follow-up on file.  Elicia Lamp, PharmD Clinical Pharmacy Resident

## 2014-04-20 NOTE — Assessment & Plan Note (Addendum)
BP Readings from Last 3 Encounters:  04/19/14 215/121  03/29/14 159/103  10/12/13 181/108    Lab Results  Component Value Date   NA 140 03/29/2014   K 4.0 03/29/2014   CREATININE 2.33* 03/29/2014    Assessment: Blood pressure control:  Uncontrolled Progress toward BP goal:   Not at goal. Comments: Takes his medication 3 days out of 7 days. Not compliant, no particular reason why. Offered him help with his medications- Compliance, pt says he needs no help. He knows what to do. If not confused about his meds. Pt did not take his Bp meds today. Pt is on Clonidine- 0.3 mg, Diltiazem - 30mg  daily, for atria fib, hydralazine- 100mg  Q8H, lisinopril 10mg . On recheck BP- 205/105. Pt denies any chest pain, SOB, headaches, hematuria, vision changes. Pt checks his BP but cannot remember any readings, if high or  Low.   Plan: Medications:  continue current medications Educational resources provided:  Self management tools provided:   Other plans: Come back in 2 weeks, with meds, bp log. Pt strongly advised to take his Bp meds toady as soon as he gets home, and everyday, and the reasons why he should adhere to his regimen. Pt to check his Bp twice a day. Pt voiced understanding.  Considering hx of rebound HTN with his hx of non compliance, Clonidine might not be perfect for him, but his options appear limited with his CKD and being on Diltiazem for Atria fib, and so not Norvasc. Can consider as an option metop and Norvasc, and D/c Diltiazem.

## 2014-04-20 NOTE — Progress Notes (Signed)
Case discussed with Dr. Denton Brick at time of visit. We reviewed the resident's history and exam and pertinent patient test results. I agree with the assessment, diagnosis, and plan of care documented in the resident's note.  Elevated blood pressure is a chronic problem.  He is currently asymptomatic.  We stressed the importance of medication compliance.  There is no indication for acute lowering of the blood pressure in clinic as the risks outweigh the benefits for this chronic problem requiring chronic medication compliance.

## 2014-04-26 ENCOUNTER — Encounter: Payer: Self-pay | Admitting: *Deleted

## 2014-05-07 ENCOUNTER — Encounter: Payer: Self-pay | Admitting: *Deleted

## 2014-05-10 ENCOUNTER — Ambulatory Visit (INDEPENDENT_AMBULATORY_CARE_PROVIDER_SITE_OTHER): Payer: Medicare Other | Admitting: Pharmacist

## 2014-05-10 ENCOUNTER — Ambulatory Visit: Payer: Medicare Other | Admitting: Internal Medicine

## 2014-05-10 ENCOUNTER — Ambulatory Visit (INDEPENDENT_AMBULATORY_CARE_PROVIDER_SITE_OTHER): Payer: Medicare Other | Admitting: Internal Medicine

## 2014-05-10 ENCOUNTER — Encounter: Payer: Self-pay | Admitting: Internal Medicine

## 2014-05-10 VITALS — BP 168/94 | HR 82 | Temp 98.4°F | Ht 68.5 in | Wt 205.0 lb

## 2014-05-10 DIAGNOSIS — I63219 Cerebral infarction due to unspecified occlusion or stenosis of unspecified vertebral arteries: Secondary | ICD-10-CM | POA: Diagnosis not present

## 2014-05-10 DIAGNOSIS — Z7901 Long term (current) use of anticoagulants: Secondary | ICD-10-CM

## 2014-05-10 DIAGNOSIS — N183 Chronic kidney disease, stage 3 unspecified: Secondary | ICD-10-CM

## 2014-05-10 DIAGNOSIS — I6381 Other cerebral infarction due to occlusion or stenosis of small artery: Secondary | ICD-10-CM

## 2014-05-10 DIAGNOSIS — I10 Essential (primary) hypertension: Secondary | ICD-10-CM | POA: Diagnosis not present

## 2014-05-10 DIAGNOSIS — I6322 Cerebral infarction due to unspecified occlusion or stenosis of basilar arteries: Secondary | ICD-10-CM | POA: Diagnosis not present

## 2014-05-10 DIAGNOSIS — D649 Anemia, unspecified: Secondary | ICD-10-CM | POA: Diagnosis not present

## 2014-05-10 DIAGNOSIS — E1165 Type 2 diabetes mellitus with hyperglycemia: Secondary | ICD-10-CM

## 2014-05-10 DIAGNOSIS — IMO0002 Reserved for concepts with insufficient information to code with codable children: Secondary | ICD-10-CM

## 2014-05-10 LAB — BASIC METABOLIC PANEL WITH GFR
BUN: 34 mg/dL — AB (ref 6–23)
CO2: 24 mEq/L (ref 19–32)
Calcium: 9.1 mg/dL (ref 8.4–10.5)
Chloride: 108 mEq/L (ref 96–112)
Creat: 2.09 mg/dL — ABNORMAL HIGH (ref 0.50–1.35)
GFR, EST AFRICAN AMERICAN: 37 mL/min — AB
GFR, EST NON AFRICAN AMERICAN: 32 mL/min — AB
Glucose, Bld: 101 mg/dL — ABNORMAL HIGH (ref 70–99)
POTASSIUM: 4.6 meq/L (ref 3.5–5.3)
Sodium: 143 mEq/L (ref 135–145)

## 2014-05-10 LAB — GLUCOSE, CAPILLARY: Glucose-Capillary: 130 mg/dL — ABNORMAL HIGH (ref 70–99)

## 2014-05-10 LAB — POCT INR
INR: 2.4
INR: 2.4
INR: 2.4

## 2014-05-10 MED ORDER — CLONIDINE HCL 0.3 MG PO TABS: 0.3000 mg | ORAL_TABLET | Freq: Three times a day (TID) | ORAL | Status: DC

## 2014-05-10 NOTE — Progress Notes (Signed)
Patient ID: Patrick [name], male   DOB: 08-25-1945, 68 y.o.   MRN: [id number]   Subjective:   HPI: Patrick [name] is a 68 y.o. man with past medical history of hypertension, hyperlipidemia, CAD,CVA with residual memory loss, insulin-dependent Type II DM, atrial fibrillation on coumadin, tachy-brady syndrome, CKD Stage 3, gout, depression, OSA, and GERD    Reason(s) for this visit: 1. Hypertension and Diabetes follow up.   Kindly see the A&P for the status of above problems.   Patient otherwise does not have any complaints today. He has been doing well lately and compliant with medications. He has his pill bottles today. Last office visit 9/21.   ROS: Constitutional: Denies fever, chills, diaphoresis, appetite change and fatigue.  Respiratory: Denies SOB, DOE, cough, chest tightness, and wheezing. Denies chest pain. CVS: No chest pain, palpitations and leg swelling.  GI: No abdominal pain, nausea, vomiting, bloody stools GU: No dysuria, frequency, hematuria, or flank pain.  MSK: No myalgias, back pain, joint swelling, arthralgias  Psych: No depression symptoms. No SI or SA.    Objective:  Physical Exam: Filed Vitals:   05/10/14 1513  BP: 168/94  Pulse: 82  Temp: 98.4 F (36.9 C)  TempSrc: Oral  Height: 5' 8.5" (1.74 m)  Weight: 205 lb (92.987 kg)  SpO2: 100%   General: Well nourished. No acute distress. Minimally interactive but alert and oriented.  HEENT: Normal oral mucosa. MMM.  Lungs: CTA bilaterally. Heart: RRR; no extra sounds or murmurs  Abdomen: Non-distended, normal bowel sounds, soft, nontender; no hepatosplenomegaly  Extremities: No pedal edema. No joint swelling or tenderness. Neurologic: Normal EOM,  Alert and oriented x3. No obvious neurologic/cranial nerve deficits.  Assessment & Plan:  Discussed case with my attending in the clinic, Dr. Daryll Drown See problem based charting.

## 2014-05-10 NOTE — Patient Instructions (Addendum)
General Instructions: Please increase Clonidine from 0.3 mg twice a day to 0.3 mg three times a day  Please check your blood pressure at home for the next one week and bring in your log Cont with the rest of your medication as before.  I would like to see you again in 1 week  Thank you for bringing your medicines today. This helps [name] keep you safe from mistakes.   Progress Toward Treatment Goals:  Treatment Goal 05/10/2014  Hemoglobin A1C unable to assess  Blood pressure improved    Self Care Goals & Plans:  Self Care Goal 05/10/2014  Manage my medications bring my medications to every visit; take my medicines as prescribed; refill my medications on time  Monitor my health -  Eat healthy foods eat baked foods instead of fried foods; eat fruit for snacks and desserts; eat foods that are low in salt  Be physically active take a walk every day    Home Blood Glucose Monitoring 05/10/2014  Check my blood sugar 3 times a day  When to check my blood sugar before breakfast; before lunch; before dinner     Care Management & Community Referrals:  Referral 10/12/2013  Referrals made for care management support none needed

## 2014-05-10 NOTE — Assessment & Plan Note (Signed)
Last Cr had gone up to 2.33 from 1.7 one year prior.  Plan  Will recheck renal function today.  Aim for better HTN control with at least goal of <140/80

## 2014-05-10 NOTE — Assessment & Plan Note (Signed)
BP Readings from Last 3 Encounters:  05/10/14 168/94  04/19/14 215/121  03/29/14 159/103    Lab Results  Component Value Date   NA 140 03/29/2014   K 4.0 03/29/2014   CREATININE 2.33* 03/29/2014    Assessment: Blood pressure control: moderately elevated Progress toward BP goal:  improved Comments: Compliant with medications. Has his pill bottles with him today.   Plan: Medications:  Increase Clonidine 0.3 mg twice a day to 0.3 mg 3 times a day. Continue with Cardizem is on 30 mg daily. Hydralazine 100 mg 3 times a day. Lisinopril 10 mg daily. Furosemide 20 mg daily. Educational resources provided:   Self management tools provided: home blood pressure logbook Other plans: His clonidine has been increased from 0.3 mg twice a day to 0.3 mg 3 times daily. Patient instructed to check his blood pressure twice a day for the next one week and followup here for his further medication adjustment as needed. Patient given a blood pressure log. If his blood pressure remains high after this change, one option would be to replace Furosemide with Spironolactone. This might even have an added advantage for him to maintain his potassium level as he is currently on potassium replacement. He will follow up in one week.

## 2014-05-10 NOTE — Assessment & Plan Note (Signed)
Lab Results  Component Value Date   HGBA1C 9.3 03/29/2014   HGBA1C 7.1 10/12/2013   HGBA1C 9.1 07/06/2013     Assessment: Diabetes control: fair control Progress toward A1C goal:  unable to assess  Comments: I reviewed home meter download. Mostly within goal. One reading CBG of 57. Patient did not have symptoms with this. Otherwise, most of the readings are within 100-120 range. Plan: Medications:  continue current medications Home glucose monitoring: Frequency: 3 times a day Timing: before breakfast;before lunch;before dinner Instruction/counseling given: reminded to bring blood glucose meter & log to each visit Educational resources provided:   Self management tools provided:   Other plans: Followup with her PCP.

## 2014-05-10 NOTE — Assessment & Plan Note (Signed)
Check INR today

## 2014-05-11 LAB — POCT INR: INR: 2.4

## 2014-05-11 NOTE — Patient Instructions (Signed)
Patient instructed to take medications as defined in the Anti-coagulation Track section of this encounter.  Patient instructed to take today's dose.  Patient verbalized understanding of these instructions.    

## 2014-05-11 NOTE — Progress Notes (Signed)
Anti-Coagulation Progress Note  Patrick [name] is a 68 y.o. male who reports to the clinic for monitoring of anticoagulation treatment.    RECENT RESULTS: Recent results are below, the most recent result is correlated with a dose of 30 mg. per week: Lab Results  Component Value Date   INR 2.4 05/11/2014   INR 2.4 05/10/2014   INR 1.6 04/19/2014    Weekly dose was unchanged.  ANTI-COAG DOSE: INR as of 05/10/2014 and Previous Dosing Information   INR Dt INR Goal Wkly Tot Sun Mon Tue Wed Thu Fri Sat   05/10/2014 2.4 2.0-3.0 30 mg 5 mg 5 mg 5 mg 2.5 mg 5 mg 2.5 mg 5 mg    Anticoagulation Dose Instructions as of 05/10/2014     Total Sun Mon Tue Wed Thu Fri Sat   New Dose 30 mg 5 mg 5 mg 5 mg 2.5 mg 5 mg 2.5 mg 5 mg     (5 mg x 1)  (5 mg x 1)  (5 mg x 1)  (5 mg x 0.5)  (5 mg x 1)  (5 mg x 0.5)  (5 mg x 1)                           ANTICOAG SUMMARY: Anticoagulation Episode Summary   Current INR goal 2.0-3.0  Next INR check 06/07/2014  INR from last check 2.4 (05/10/2014)  Most recent INR 2.4 (05/11/2014)  Weekly max dose   Target end date Indefinite  [facility]  Preferred lab   Send INR reminders to    Indications  ATRIAL FIBRILLATION PAROXYSMAL CHRONIC (Resolved) [I48.91] Acute ischemic VBA thalamic stroke [I63.219 I63.22] Chronic anticoagulation on coumadin [Z79.01]        Comments        PATIENT INSTRUCTIONS: Patient Instructions  Patient instructed to take medications as defined in the Anti-coagulation Track section of this encounter.  Patient instructed to take today's dose.  Patient verbalized understanding of these instructions.   FOLLOW-UP 4 weeks  Patient was seen in clinic by Elicia Lamp, PharmD, PGY1 pharmacy  resident. I agree with the assessment, diagnosis, and plan of care documented by the pharmacy resident.  [name], PharmD BCPS, BCACP

## 2014-05-11 NOTE — Progress Notes (Signed)
Internal Medicine Clinic Attending  Case discussed with Dr. Kazibwe soon after the resident saw the patient.  We reviewed the resident's history and exam and pertinent patient test results.  I agree with the assessment, diagnosis, and plan of care documented in the resident's note. 

## 2014-05-14 NOTE — Progress Notes (Signed)
On coumadin for pAF.  INR at goal.  I have reviewed Dr. Julianne Rice note

## 2014-05-17 ENCOUNTER — Ambulatory Visit: Payer: Medicare Other | Admitting: Internal Medicine

## 2014-06-04 ENCOUNTER — Other Ambulatory Visit: Payer: Self-pay | Admitting: Pulmonary Disease

## 2014-06-04 DIAGNOSIS — E113219 Type 2 diabetes mellitus with mild nonproliferative diabetic retinopathy with macular edema, unspecified eye: Secondary | ICD-10-CM

## 2014-06-07 ENCOUNTER — Ambulatory Visit (INDEPENDENT_AMBULATORY_CARE_PROVIDER_SITE_OTHER): Payer: Medicare Other | Admitting: Pharmacist

## 2014-06-07 ENCOUNTER — Other Ambulatory Visit: Payer: Self-pay | Admitting: Internal Medicine

## 2014-06-07 DIAGNOSIS — I6381 Other cerebral infarction due to occlusion or stenosis of small artery: Secondary | ICD-10-CM

## 2014-06-07 DIAGNOSIS — N183 Chronic kidney disease, stage 3 (moderate): Secondary | ICD-10-CM | POA: Diagnosis not present

## 2014-06-07 DIAGNOSIS — Z7901 Long term (current) use of anticoagulants: Secondary | ICD-10-CM

## 2014-06-07 DIAGNOSIS — I63219 Cerebral infarction due to unspecified occlusion or stenosis of unspecified vertebral arteries: Secondary | ICD-10-CM

## 2014-06-07 DIAGNOSIS — I10 Essential (primary) hypertension: Secondary | ICD-10-CM

## 2014-06-07 DIAGNOSIS — I6322 Cerebral infarction due to unspecified occlusion or stenosis of basilar arteries: Secondary | ICD-10-CM

## 2014-06-07 LAB — POCT INR: INR: 2.2

## 2014-06-07 MED ORDER — WARFARIN SODIUM 5 MG PO TABS: ORAL_TABLET | ORAL | Status: DC

## 2014-06-07 NOTE — Patient Instructions (Signed)
Patient instructed to take medications as defined in the Anti-coagulation Track section of this encounter.  Patient instructed to take today's dose.  Patient verbalized understanding of these instructions.    

## 2014-06-07 NOTE — Progress Notes (Signed)
Anti-Coagulation Progress Note  Patrick [name] is a 68 y.o. male who is currently on an anti-coagulation regimen.    RECENT RESULTS: Recent results are below, the most recent result is correlated with a dose of 30 mg. per week: Lab Results  Component Value Date   INR 2.20 06/07/2014   INR 2.4 05/11/2014   INR 2.4 05/10/2014    ANTI-COAG DOSE: Anticoagulation Dose Instructions as of 06/07/2014      [name] Tue Wed Thu Fri Sat   New Dose 5 mg 5 mg 5 mg 2.5 mg 5 mg 2.5 mg 5 mg       ANTICOAG SUMMARY: Anticoagulation Episode Summary    Current INR goal 2.0-3.0  Next INR check 06/28/2014  INR from last check 2.20 (06/07/2014)  Weekly max dose   Target end date Indefinite  [facility]  Preferred lab   Send INR reminders to    Indications  ATRIAL FIBRILLATION PAROXYSMAL CHRONIC (Resolved) [I48.91] Acute ischemic VBA thalamic stroke [I63.219 I63.22] Chronic anticoagulation on coumadin [Z79.01]        Comments         ANTICOAG TODAY: Anticoagulation Summary as of 06/07/2014    INR goal 2.0-3.0  Selected INR 2.20 (06/07/2014)  Next INR check 06/28/2014  Target end date Indefinite   Indications  ATRIAL FIBRILLATION PAROXYSMAL CHRONIC (Resolved) [I48.91] Acute ischemic VBA thalamic stroke [I63.219 I63.22] Chronic anticoagulation on coumadin [Z79.01]      Anticoagulation Episode Summary    [facility]   Preferred lab    Send INR reminders to    Comments       PATIENT INSTRUCTIONS: Patient Instructions  Patient instructed to take medications as defined in the Anti-coagulation Track section of this encounter.  Patient instructed to take today's dose.  Patient verbalized understanding of these instructions.       FOLLOW-UP Return in 3 weeks (on 06/28/2014) for Follow up INR at [facility], III Pharm.D., CACP

## 2014-06-08 NOTE — Progress Notes (Signed)
INTERNAL MEDICINE TEACHING ATTENDING ADDENDUM - Aldine Contes M.D  Duration- indefinite, Indication- afib, INR- therapeutic. Agree with Dr. Gladstone Pih recommendations as outlined in his note.

## 2014-06-15 MED ORDER — DILTIAZEM HCL 30 MG PO TABS: 30.0000 mg | ORAL_TABLET | Freq: Three times a day (TID) | ORAL | Status: DC

## 2014-06-15 NOTE — Telephone Encounter (Signed)
Ordered refill on diltiazem 30mg  1 tablet three times daily

## 2014-06-28 ENCOUNTER — Ambulatory Visit (INDEPENDENT_AMBULATORY_CARE_PROVIDER_SITE_OTHER): Payer: Medicare Other | Admitting: Pharmacist

## 2014-06-28 DIAGNOSIS — I6322 Cerebral infarction due to unspecified occlusion or stenosis of basilar arteries: Secondary | ICD-10-CM

## 2014-06-28 DIAGNOSIS — I63219 Cerebral infarction due to unspecified occlusion or stenosis of unspecified vertebral arteries: Secondary | ICD-10-CM

## 2014-06-28 DIAGNOSIS — Z7901 Long term (current) use of anticoagulants: Secondary | ICD-10-CM

## 2014-06-28 DIAGNOSIS — I6381 Other cerebral infarction due to occlusion or stenosis of small artery: Secondary | ICD-10-CM

## 2014-06-28 LAB — POCT INR: INR: 2.2

## 2014-06-28 NOTE — Patient Instructions (Signed)
Patient instructed to take medications as defined in the Anti-coagulation Track section of this encounter.  Patient instructed to take today's dose.  Patient verbalized understanding of these instructions.    

## 2014-06-28 NOTE — Progress Notes (Signed)
Anti-Coagulation Progress Note  Patrick [name] is a 68 y.o. male who is currently on an anti-coagulation regimen.    RECENT RESULTS: Recent results are below, the most recent result is correlated with a dose of 30 mg. per week: Lab Results  Component Value Date   INR 2.2 06/28/2014   INR 2.20 06/07/2014   INR 2.4 05/11/2014    ANTI-COAG DOSE: Anticoagulation Dose Instructions as of 06/28/2014      [name] Tue Wed Thu Fri Sat   New Dose 5 mg 5 mg 5 mg 2.5 mg 5 mg 2.5 mg 5 mg       ANTICOAG SUMMARY: Anticoagulation Episode Summary    Current INR goal 2.0-3.0  Next INR check 07/26/2014  INR from last check 2.2 (06/28/2014)  Weekly max dose   Target end date Indefinite  [facility]  Preferred lab   Send INR reminders to    Indications  ATRIAL FIBRILLATION PAROXYSMAL CHRONIC (Resolved) [I48.91] Acute ischemic VBA thalamic stroke [I63.219 I63.22] Chronic anticoagulation on coumadin [Z79.01]        Comments         ANTICOAG TODAY: Anticoagulation Summary as of 06/28/2014    INR goal 2.0-3.0  Selected INR 2.2 (06/28/2014)  Next INR check 07/26/2014  Target end date Indefinite   Indications  ATRIAL FIBRILLATION PAROXYSMAL CHRONIC (Resolved) [I48.91] Acute ischemic VBA thalamic stroke [I63.219 I63.22] Chronic anticoagulation on coumadin [Z79.01]      Anticoagulation Episode Summary    [facility] check [facility]   Preferred lab    Send INR reminders to    Comments       PATIENT INSTRUCTIONS: Patient Instructions  Patient instructed to take medications as defined in the Anti-coagulation Track section of this encounter.  Patient instructed to take today's dose.  Patient verbalized understanding of these instructions.        FOLLOW-UP Return in 4 weeks (on 07/26/2014) for Follow-up INR at 3pm.  Elicia Lamp, PharmD Clinical Pharmacy Resident

## 2014-06-30 DIAGNOSIS — H4322 Crystalline deposits in vitreous body, left eye: Secondary | ICD-10-CM | POA: Diagnosis not present

## 2014-06-30 DIAGNOSIS — H3582 Retinal ischemia: Secondary | ICD-10-CM | POA: Diagnosis not present

## 2014-06-30 DIAGNOSIS — E11341 Type 2 diabetes mellitus with severe nonproliferative diabetic retinopathy with macular edema: Secondary | ICD-10-CM | POA: Diagnosis not present

## 2014-06-30 LAB — HM DIABETES EYE EXAM

## 2014-07-01 ENCOUNTER — Other Ambulatory Visit: Payer: Self-pay | Admitting: *Deleted

## 2014-07-03 MED ORDER — HYDRALAZINE HCL 100 MG PO TABS: 100.0000 mg | ORAL_TABLET | Freq: Three times a day (TID) | ORAL | Status: DC

## 2014-07-03 MED ORDER — LISINOPRIL 10 MG PO TABS: 10.0000 mg | ORAL_TABLET | Freq: Every day | ORAL | Status: DC

## 2014-07-26 ENCOUNTER — Ambulatory Visit (INDEPENDENT_AMBULATORY_CARE_PROVIDER_SITE_OTHER): Payer: Medicare Other | Admitting: Pharmacist

## 2014-07-26 DIAGNOSIS — I6322 Cerebral infarction due to unspecified occlusion or stenosis of basilar arteries: Secondary | ICD-10-CM

## 2014-07-26 DIAGNOSIS — I6381 Other cerebral infarction due to occlusion or stenosis of small artery: Secondary | ICD-10-CM

## 2014-07-26 DIAGNOSIS — I63219 Cerebral infarction due to unspecified occlusion or stenosis of unspecified vertebral arteries: Secondary | ICD-10-CM | POA: Diagnosis not present

## 2014-07-26 DIAGNOSIS — Z7901 Long term (current) use of anticoagulants: Secondary | ICD-10-CM | POA: Diagnosis not present

## 2014-07-26 LAB — POCT INR: INR: 1.9

## 2014-07-26 NOTE — Patient Instructions (Signed)
Patient instructed to take medications as defined in the Anti-coagulation Track section of this encounter.  Patient instructed to take today's dose.  Patient verbalized understanding of these instructions.    

## 2014-07-26 NOTE — Progress Notes (Signed)
Anti-Coagulation Progress Note  Patrick [name] is a 68 y.o. male who reports to the clinic for monitoring of anticoagulation treatment.    RECENT RESULTS: Recent results are below, the most recent result is correlated with a dose of 30 mg. per week: Lab Results  Component Value Date   INR 1.9 07/26/2014   INR 2.2 06/28/2014   INR 2.20 06/07/2014   Pt reported eating some more greens around the holiday then usual which may have played a part in todays slightly subtherapeutic INR. Will plan to continue current dose since pt was therapeutic on this dose the last 3 visits.  ANTI-COAG DOSE: INR as of 07/26/2014 and Previous Dosing Information    INR Dt INR Goal Wkly Tot Sun Mon Tue Wed Thu Fri Sat   07/26/2014 1.9 2.0-3.0 30 mg 5 mg 5 mg 5 mg 2.5 mg 5 mg 2.5 mg 5 mg    Anticoagulation Dose Instructions as of 07/26/2014      Total Sun Mon Tue Wed Thu Fri Sat   New Dose 30 mg 5 mg 5 mg 5 mg 2.5 mg 5 mg 2.5 mg 5 mg     (5 mg x 1)  (5 mg x 1)  (5 mg x 1)  (5 mg x 0.5)  (5 mg x 1)  (5 mg x 0.5)  (5 mg x 1)                           ANTICOAG SUMMARY: Anticoagulation Episode Summary    Current INR goal 2.0-3.0  Next INR check 08/23/2014  INR from last check 1.9! (07/26/2014)  Weekly max dose   Target end date Indefinite  [facility]  Preferred lab   Send INR reminders to    Indications  ATRIAL FIBRILLATION PAROXYSMAL CHRONIC (Resolved) [I48.91] Acute ischemic VBA thalamic stroke [I63.219 I63.22] Chronic anticoagulation on coumadin [Z79.01]        Comments        PATIENT INSTRUCTIONS: Patient Instructions  Patient instructed to take medications as defined in the Anti-coagulation Track section of this encounter.  Patient instructed to take today's dose.  Patient verbalized understanding of these instructions.     FOLLOW-UP Return in 4 weeks (on 08/23/2014) for Follow up INR at 3:00.  Elenor Quinones, PharmD Clinical Pharmacist  Resident Pager [phone] 07/26/2014 3:20 PM

## 2014-08-02 ENCOUNTER — Encounter: Payer: Self-pay | Admitting: *Deleted

## 2014-08-06 ENCOUNTER — Other Ambulatory Visit: Payer: Self-pay | Admitting: Internal Medicine

## 2014-08-23 ENCOUNTER — Ambulatory Visit (INDEPENDENT_AMBULATORY_CARE_PROVIDER_SITE_OTHER): Payer: Medicare Other | Admitting: Pharmacist

## 2014-08-23 DIAGNOSIS — I4891 Unspecified atrial fibrillation: Secondary | ICD-10-CM

## 2014-08-23 DIAGNOSIS — I6322 Cerebral infarction due to unspecified occlusion or stenosis of basilar arteries: Secondary | ICD-10-CM

## 2014-08-23 DIAGNOSIS — Z7901 Long term (current) use of anticoagulants: Secondary | ICD-10-CM

## 2014-08-23 DIAGNOSIS — I63219 Cerebral infarction due to unspecified occlusion or stenosis of unspecified vertebral arteries: Secondary | ICD-10-CM

## 2014-08-23 DIAGNOSIS — I6381 Other cerebral infarction due to occlusion or stenosis of small artery: Secondary | ICD-10-CM

## 2014-08-23 LAB — POCT INR: INR: 1.5

## 2014-08-23 NOTE — Patient Instructions (Signed)
Patient instructed to take medications as defined in the Anti-coagulation Track section of this encounter.  Patient instructed to take today's dose.  Patient verbalized understanding of these instructions.    

## 2014-08-23 NOTE — Progress Notes (Signed)
Anti-Coagulation Progress Note  Patrick [name] is a 69 y.o. male who is currently on an anti-coagulation regimen.    RECENT RESULTS: Recent results are below, the most recent result is correlated with a dose of 30 mg. per week: Lab Results  Component Value Date   INR 1.50 08/23/2014   INR 1.9 07/26/2014   INR 2.2 06/28/2014    ANTI-COAG DOSE: Anticoagulation Dose Instructions as of 08/23/2014      [name] Tue Wed Thu Fri Sat   New Dose 5 mg 5 mg 5 mg 2.5 mg 5 mg 2.5 mg 5 mg       ANTICOAG SUMMARY: Anticoagulation Episode Summary    Current INR goal 2.0-3.0  Next INR check 09/13/2014  INR from last check 1.50! (08/23/2014)  Weekly max dose   Target end date Indefinite  [facility]  Preferred lab   Send INR reminders to    Indications  ATRIAL FIBRILLATION PAROXYSMAL CHRONIC (Resolved) [I48.91] Acute ischemic VBA thalamic stroke [I63.219 I63.22] Chronic anticoagulation on coumadin [Z79.01]        Comments         ANTICOAG TODAY: Anticoagulation Summary as of 08/23/2014    INR goal 2.0-3.0  Selected INR 1.50! (08/23/2014)  Next INR check 09/13/2014  Target end date Indefinite   Indications  ATRIAL FIBRILLATION PAROXYSMAL CHRONIC (Resolved) [I48.91] Acute ischemic VBA thalamic stroke [I63.219 I63.22] Chronic anticoagulation on coumadin [Z79.01]      Anticoagulation Episode Summary    [facility]   Preferred lab    Send INR reminders to    Comments       PATIENT INSTRUCTIONS: Patient Instructions  Patient instructed to take medications as defined in the Anti-coagulation Track section of this encounter.  Patient instructed to take today's dose.  Patient verbalized understanding of these instructions.        FOLLOW-UP Return in about 3 weeks (around 09/13/2014) for Follow-up INR @ 1500.   Theron Arista, PharmD Clinical Pharmacist - Resident Pager: [phone] 1/25/20163:00 PM

## 2014-08-24 NOTE — Progress Notes (Signed)
Indication: Permanent atrial fibrillation. Duration: Lifelong. INR: Below target. Agree with Dr. Kristine Royal assessment and plan.

## 2014-08-25 ENCOUNTER — Telehealth: Payer: Self-pay | Admitting: *Deleted

## 2014-08-25 NOTE — Telephone Encounter (Signed)
Received fax from pt's pharmacy-Patient has been on the "Zydus" generic warfarin, but pharmacy is now changing to the "Citron" generic warfarin.  Before they can make the change, they will need approval from pt's MD.  Please advise.Despina Hidden Cassady1/27/201610:42 AM

## 2014-08-26 ENCOUNTER — Other Ambulatory Visit: Payer: Self-pay | Admitting: *Deleted

## 2014-08-30 MED ORDER — WARFARIN SODIUM 5 MG PO TABS: ORAL_TABLET | ORAL | Status: DC

## 2014-08-30 NOTE — Telephone Encounter (Signed)
Likely OK but will forward to Dr Maudie Mercury for review

## 2014-08-31 NOTE — Telephone Encounter (Signed)
The switch to Citron is fine. Patient is doing well keeping follow up visits for warfarin clinic so we can keep a close watch.

## 2014-09-13 ENCOUNTER — Ambulatory Visit: Payer: Medicare Other

## 2014-09-14 ENCOUNTER — Other Ambulatory Visit: Payer: Self-pay | Admitting: Internal Medicine

## 2014-09-14 DIAGNOSIS — E785 Hyperlipidemia, unspecified: Secondary | ICD-10-CM

## 2014-09-22 ENCOUNTER — Other Ambulatory Visit: Payer: Self-pay | Admitting: Internal Medicine

## 2014-09-22 NOTE — Telephone Encounter (Signed)
No showed last 3 appt. Needs PCP or [facility] appt ASAP. Only 30 days given

## 2014-09-27 ENCOUNTER — Ambulatory Visit (INDEPENDENT_AMBULATORY_CARE_PROVIDER_SITE_OTHER): Payer: Medicare Other | Admitting: Pharmacist

## 2014-09-27 DIAGNOSIS — Z7901 Long term (current) use of anticoagulants: Secondary | ICD-10-CM

## 2014-09-27 DIAGNOSIS — I63219 Cerebral infarction due to unspecified occlusion or stenosis of unspecified vertebral arteries: Secondary | ICD-10-CM

## 2014-09-27 DIAGNOSIS — I6381 Other cerebral infarction due to occlusion or stenosis of small artery: Secondary | ICD-10-CM

## 2014-09-27 DIAGNOSIS — I6322 Cerebral infarction due to unspecified occlusion or stenosis of basilar arteries: Secondary | ICD-10-CM

## 2014-09-27 LAB — POCT INR: INR: 2.8

## 2014-09-27 NOTE — Progress Notes (Signed)
Anti-Coagulation Progress Note  Patrick [name] is a 69 y.o. male who is currently on an anti-coagulation regimen.  RECENT RESULTS:  Recent results are below, the most recent result is correlated with a dose of 35 mg. per week:  Lab Results  Component Value Date   INR 2.8 09/27/2014   INR 1.50 08/23/2014   INR 1.9 07/26/2014    ANTI-COAG DOSE:  Anticoagulation Dose Instructions as of 09/27/2014      [name] Tue Wed Thu Fri Sat   New Dose 5 mg 5 mg 5 mg 2.5 mg 5 mg 2.5 mg 5 mg      ANTICOAG SUMMARY:  Anticoagulation Episode Summary    Current INR goal 2.0-3.0  Next INR check 10/18/2014  INR from last check 2.8 (09/27/2014)  Weekly max dose   Target end date Indefinite  [facility]  Preferred lab   Send INR reminders to    Indications  ATRIAL FIBRILLATION PAROXYSMAL CHRONIC (Resolved) [I48.91] Acute ischemic VBA thalamic stroke [I63.219 I63.22] Chronic anticoagulation on coumadin [Z79.01]        Comments         ANTICOAG TODAY:  Anticoagulation Summary as of 09/27/2014    INR goal 2.0-3.0  Selected INR 2.8 (09/27/2014)  Next INR check 10/18/2014  Target end date Indefinite   Indications  ATRIAL FIBRILLATION PAROXYSMAL CHRONIC (Resolved) [I48.91] Acute ischemic VBA thalamic stroke [I63.219 I63.22] Chronic anticoagulation on coumadin [Z79.01]      Anticoagulation Episode Summary    [facility]   Preferred lab    Send INR reminders to    Comments       PATIENT INSTRUCTIONS:  Patient Instructions  Patient instructed to take medications as defined in the Anti-coagulation Track section of this encounter.  Patient instructed to take today's dose.  Patient verbalized understanding of these instructions.        FOLLOW-UP  Return in 3 weeks (on 10/18/2014) for Follow up INR at 2:15pm.   Thank you for allowing pharmacy to be part of this patient's care team  [place], Pharm.D Clinical Pharmacy Resident Pager:  [phone] 09/27/2014 .2:46 PM

## 2014-09-27 NOTE — Patient Instructions (Signed)
Patient instructed to take medications as defined in the Anti-coagulation Track section of this encounter.  Patient instructed to take today's dose.  Patient verbalized understanding of these instructions.    

## 2014-09-30 ENCOUNTER — Encounter: Payer: Self-pay | Admitting: *Deleted

## 2014-10-11 ENCOUNTER — Other Ambulatory Visit: Payer: Self-pay | Admitting: *Deleted

## 2014-10-11 DIAGNOSIS — E1165 Type 2 diabetes mellitus with hyperglycemia: Secondary | ICD-10-CM

## 2014-10-11 DIAGNOSIS — IMO0002 Reserved for concepts with insufficient information to code with codable children: Secondary | ICD-10-CM

## 2014-10-11 MED ORDER — GLUCOSE BLOOD VI STRP: ORAL_STRIP | Status: DC

## 2014-10-18 ENCOUNTER — Ambulatory Visit (INDEPENDENT_AMBULATORY_CARE_PROVIDER_SITE_OTHER): Payer: Medicare Other | Admitting: Internal Medicine

## 2014-10-18 ENCOUNTER — Ambulatory Visit (INDEPENDENT_AMBULATORY_CARE_PROVIDER_SITE_OTHER): Payer: Medicare Other | Admitting: Pharmacist

## 2014-10-18 VITALS — BP 156/87 | HR 73 | Temp 97.5°F | Ht 69.0 in | Wt 191.6 lb

## 2014-10-18 DIAGNOSIS — Z7982 Long term (current) use of aspirin: Secondary | ICD-10-CM

## 2014-10-18 DIAGNOSIS — I4821 Permanent atrial fibrillation: Secondary | ICD-10-CM

## 2014-10-18 DIAGNOSIS — Z87891 Personal history of nicotine dependence: Secondary | ICD-10-CM

## 2014-10-18 DIAGNOSIS — M1A9XX0 Chronic gout, unspecified, without tophus (tophi): Secondary | ICD-10-CM | POA: Diagnosis not present

## 2014-10-18 DIAGNOSIS — Z794 Long term (current) use of insulin: Secondary | ICD-10-CM

## 2014-10-18 DIAGNOSIS — Z8673 Personal history of transient ischemic attack (TIA), and cerebral infarction without residual deficits: Secondary | ICD-10-CM

## 2014-10-18 DIAGNOSIS — E119 Type 2 diabetes mellitus without complications: Secondary | ICD-10-CM | POA: Diagnosis not present

## 2014-10-18 DIAGNOSIS — I10 Essential (primary) hypertension: Secondary | ICD-10-CM

## 2014-10-18 DIAGNOSIS — I6381 Other cerebral infarction due to occlusion or stenosis of small artery: Secondary | ICD-10-CM

## 2014-10-18 DIAGNOSIS — E113419 Type 2 diabetes mellitus with severe nonproliferative diabetic retinopathy with macular edema, unspecified eye: Secondary | ICD-10-CM

## 2014-10-18 DIAGNOSIS — D649 Anemia, unspecified: Secondary | ICD-10-CM

## 2014-10-18 DIAGNOSIS — E11341 Type 2 diabetes mellitus with severe nonproliferative diabetic retinopathy with macular edema: Secondary | ICD-10-CM | POA: Diagnosis not present

## 2014-10-18 DIAGNOSIS — Z9114 Patient's other noncompliance with medication regimen: Secondary | ICD-10-CM | POA: Diagnosis not present

## 2014-10-18 DIAGNOSIS — I482 Chronic atrial fibrillation: Secondary | ICD-10-CM

## 2014-10-18 DIAGNOSIS — Z7901 Long term (current) use of anticoagulants: Secondary | ICD-10-CM

## 2014-10-18 DIAGNOSIS — I63219 Cerebral infarction due to unspecified occlusion or stenosis of unspecified vertebral arteries: Secondary | ICD-10-CM

## 2014-10-18 DIAGNOSIS — Z0000 Encounter for general adult medical examination without abnormal findings: Secondary | ICD-10-CM

## 2014-10-18 DIAGNOSIS — E1165 Type 2 diabetes mellitus with hyperglycemia: Secondary | ICD-10-CM | POA: Diagnosis not present

## 2014-10-18 DIAGNOSIS — I6322 Cerebral infarction due to unspecified occlusion or stenosis of basilar arteries: Secondary | ICD-10-CM

## 2014-10-18 LAB — POCT GLYCOSYLATED HEMOGLOBIN (HGB A1C): Hemoglobin A1C: 6.2

## 2014-10-18 LAB — POCT INR: INR: 2.7

## 2014-10-18 LAB — GLUCOSE, CAPILLARY: Glucose-Capillary: 109 mg/dL — ABNORMAL HIGH (ref 70–99)

## 2014-10-18 MED ORDER — FUROSEMIDE 20 MG PO TABS: 20.0000 mg | ORAL_TABLET | Freq: Every day | ORAL | Status: DC

## 2014-10-18 MED ORDER — METFORMIN HCL 500 MG PO TABS: 500.0000 mg | ORAL_TABLET | Freq: Every day | ORAL | Status: DC

## 2014-10-18 NOTE — Patient Instructions (Signed)
-  Keep taking metformin 500 mg daily, stop taking glipizide, your A1c has improved to 6.2, congratulations! -Keep taking your blood pressure medications -Will check your lab work and let you know if you need to take potassium or not  -Please come back in 3 months, nice seeing you again!  General Instructions:   Thank you for bringing your medicines today. This helps [name] keep you safe from mistakes.   Progress Toward Treatment Goals:  Treatment Goal 05/10/2014  Hemoglobin A1C unable to assess  Blood pressure improved    Self Care Goals & Plans:  Self Care Goal 10/18/2014  Manage my medications take my medicines as prescribed; bring my medications to every visit; refill my medications on time  Monitor my health keep track of my blood glucose; check my feet daily  Eat healthy foods eat more vegetables; eat foods that are low in salt; eat baked foods instead of fried foods  Be physically active take a walk every day    Home Blood Glucose Monitoring 05/10/2014  Check my blood sugar 3 times a day  When to check my blood sugar before breakfast; before lunch; before dinner     Care Management & Community Referrals:  Referral 10/12/2013  Referrals made for care management support none needed

## 2014-10-18 NOTE — Progress Notes (Signed)
Patient ID: Patrick [name], male   DOB: 06/12/1946, 68 y.o.   MRN: [id number]    Subjective:   Patient ID: Patrick [name] male   DOB: 06/26/1946 68 y.o.   MRN: [id number]  HPI: [name] is a 68 y.o. man with past medical history of hypertension, hyperlipidemia, CAD,CVA with residual memory loss, Type II DM, atrial fibrillation on coumadin, tachy-brady syndrome, CKD Stage 3, non-tophaceous, gout, depression, OSA, and GERD who presents for routine clinic visit. He is a poor historian and is accompanied by his wife.   His last A1c was 9.3 on 03/29/14. He checks his blood sugars 1-2 times daily with average in low 100's. He did not bring his meter today. He has not been taking insulin for about the past 2 months. He is taking metformin 500 mg daily instead of BID and also taking glipizide 5 mg daily. He has had low blood sugars per his wife. He has chronic polydipsia, polyuria, and blurry vision but denies polyphagia, neuropathy, or foot injury/ulcer. He is trying to follow a healthy diet but does not exercise. He has lost about 14 lbs since last visit in October.    He is not always compliant with taking clonidine, diltiazem, hydralazine, lisinopril, and furosemide for hypertension. He has occasional headache but denies chest pain, LE swelling, or lightheadedness. He is no longer taking potassium supplementation.   He denies recent gout flare and is compliant with taking allopurinol daily however is taking 100 mg daily instead of BID. His last uric acid level was 6.2 on 03/29/14.   He reports compliance with taking pravastatin daily for hyperlipidemia and denies myalgias.   He is compliant with taking coumadin for atrial fibrillation and denies recent bleeding.     Past Medical History  Diagnosis Date  . Hypertension   . CKD (chronic kidney disease), stage II   . CAD (coronary artery disease)   . Depression   . Gout   . Hyperlipidemia   . CVA (cerebral infarction) 11/22/10    Thalamic with  residual memory loss and slow speech  . GERD (gastroesophageal reflux disease)   . Diabetes mellitus type II     Insulin dependent  . Upper GI bleeding 07/01/2013  . Paroxysmal atrial fibrillation     on coumadin  . Atrial fibrillation   . Myocardial infarction 2000's    "near heart attack" (07/14/2013)  . Shortness of breath     "can happen at any time" (07/14/2013)  . Sleep apnea 07/2010    "has mask at home; seldom uses it" (07/14/2013)  . Stomach ulcer 1950's    "as a teenager"  . Stroke ~ 2007; ~ 2009    "memory not as good since" (07/14/2013)  . Permanent atrial fibrillation 07/17/2013  . Chronic anticoagulation, on coumadin 07/17/2013  . Tachy-brady syndrome 07/17/2013   Current Outpatient Prescriptions  Medication Sig Dispense Refill  . allopurinol (ZYLOPRIM) 100 MG tablet TAKE TWO TABLETS BY MOUTH ONCE DAILY 60 tablet 2  . aspirin 81 MG EC tablet Take 81 mg by mouth daily.      . Blood Glucose Monitoring Suppl (ACCU-CHEK AVIVA PLUS) W/DEVICE KIT 1 each by Does not apply route as needed. 1 kit 0  . cloNIDine (CATAPRES) 0.3 MG tablet Take 1 tablet (0.3 mg total) by mouth 3 (three) times daily. 180 tablet 6  . diltiazem (CARDIZEM) 30 MG tablet Take 1 tablet (30 mg total) by mouth 3 (three) times daily. 180 tablet 2  . furosemide (LASIX) 20   MG tablet TAKE ONE TABLET BY MOUTH ONCE DAILY 30 tablet 0  . glipiZIDE (GLUCOTROL XL) 5 MG 24 hr tablet TAKE ONE TABLET BY MOUTH ONCE DAILY 30 tablet 6  . glucose blood (TRUETRACK TEST) test strip Use to test blood sugar 2-3 times daily dx code 250.00 100 each 11  . hydrALAZINE (APRESOLINE) 100 MG tablet Take 1 tablet (100 mg total) by mouth every 8 (eight) hours. 90 tablet 2  . insulin NPH (HUMULIN N,NOVOLIN N) 100 UNIT/ML injection Inject 10-20 Units into the skin 2 (two) times daily. Takes 20 units in the morning and 10 units in the evening.    . Insulin Syringe-Needle U-100 (ELITE-THIN INS SYR .5CC/31G) 31G X 5/16" 0.5 ML MISC 1 each by  Does not apply route 3 (three) times daily. Use to inject insulin daily 100 each 11  . Lancets 30G MISC Use to test blood glucose 2-3 times daily dx code 250.00 100 each 11  . lisinopril (PRINIVIL,ZESTRIL) 10 MG tablet Take 1 tablet (10 mg total) by mouth daily. 30 tablet 3  . metFORMIN (GLUCOPHAGE) 500 MG tablet Take 1 tablet (500 mg total) by mouth 2 (two) times daily with a meal. 60 tablet 3  . potassium chloride (MICRO-K) 10 MEQ CR capsule TAKE ONE CAPSULE BY MOUTH TWICE DAILY 180 capsule 0  . pravastatin (PRAVACHOL) 80 MG tablet Take 1 tablet (80 mg total) by mouth daily. 90 tablet 3  . warfarin (COUMADIN) 5 MG tablet Take 1 tablet all days of week EXCEPT on Wednesdays and Fridays--take only 1/2 tablet. 30 tablet 2   No current facility-administered medications for this visit.   No family history on file. History   Social History  . Marital Status: Married    [name]  . Number of Children: N/A  . Years of Education: 5   Occupational History  .     Social History Main Topics  . Smoking status: Former Smoker -- 0.50 packs/day for 15 years    Types: Cigarettes    Quit date: 10/26/1976  . Smokeless tobacco: Former Systems developer     Comment: 07/14/2013 "quit chewing and dipping in ~ 1970"  . Alcohol Use: Yes     Comment: 07/14/2013 "drank a little; quit in ~ 1970; never had problem w/it"  . Drug Use: No  . Sexual Activity: Not Currently   Other Topics Concern  . Not on file   Social History Narrative  . No narrative on file   Review of Systems: Review of Systems  Constitutional: Positive for weight loss (intentional ) and malaise/fatigue (chronic). Negative for fever and chills.  HENT: Negative for congestion.   Eyes: Positive for blurred vision (chronic).  Respiratory: Positive for shortness of breath. Negative for cough and wheezing.   Cardiovascular: Negative for chest pain and leg swelling.  Gastrointestinal: Negative for nausea, vomiting, abdominal pain, diarrhea,  constipation and blood in stool.  Genitourinary: Negative for dysuria, urgency, frequency and hematuria.  Musculoskeletal: Negative for myalgias.  Neurological: Positive for headaches. Negative for dizziness.  Endo/Heme/Allergies: Positive for polydipsia (chronic).  Psychiatric/Behavioral: The patient does not have insomnia.     Objective:  Physical Exam: Filed Vitals:   10/18/14 1420  BP: 156/87  Pulse: 73  Temp: 97.5 F (36.4 C)  TempSrc: Oral  Height: 5' 9" (1.753 m)  Weight: 191 lb 9.6 oz (86.909 kg)  SpO2: 100%    Physical Exam  Constitutional: He is oriented to person, place, and time. He appears well-developed and  well-nourished. No distress.  HENT:  Head: Normocephalic and atraumatic.  Right Ear: External ear normal.  Left Ear: External ear normal.  Nose: Nose normal.  Mouth/Throat: Oropharynx is clear and moist. No oropharyngeal exudate.  Eyes: Conjunctivae and EOM are normal. Pupils are equal, round, and reactive to light. Right eye exhibits no discharge. Left eye exhibits no discharge. No scleral icterus.  Neck: Normal range of motion. Neck supple.  Cardiovascular: Normal rate.   Irregularly irregular rhythm   Pulmonary/Chest: Effort normal and breath sounds normal. No respiratory distress. He has no wheezes. He has no rales.  Abdominal: Soft. Bowel sounds are normal. He exhibits no distension. There is no tenderness. There is no rebound and no guarding.  Musculoskeletal: Normal range of motion. He exhibits no edema or tenderness.  Neurological: He is alert and oriented to person, place, and time.  Skin: Skin is warm and dry. No rash noted. He is not diaphoretic. No erythema. No pallor.  Psychiatric: His behavior is normal. Judgment and thought content normal.  Flat affect    Assessment & Plan:   Please see problem list for problem-based assessment and plan 

## 2014-10-18 NOTE — Patient Instructions (Signed)
Patient instructed to take medications as defined in the Anti-coagulation Track section of this encounter.  Patient instructed to take today's dose.  Patient verbalized understanding of these instructions.    

## 2014-10-18 NOTE — Progress Notes (Signed)
Anti-Coagulation Progress Note  Patrick [name] is a 69 y.o. male who is currently on an anti-coagulation regimen.    RECENT RESULTS: Recent results are below, the most recent result is correlated with a dose of 35 mg. per week: Lab Results  Component Value Date   INR 2.70 10/18/2014   INR 2.8 09/27/2014   INR 1.50 08/23/2014    ANTI-COAG DOSE: Anticoagulation Dose Instructions as of 10/18/2014      [name] Tue Wed Thu Fri Sat   New Dose 5 mg 5 mg 5 mg 5 mg 5 mg 5 mg 5 mg       ANTICOAG SUMMARY: Anticoagulation Episode Summary    Current INR goal 2.0-3.0  Next INR check 11/08/2014  INR from last check 2.70 (10/18/2014)  Weekly max dose   Target end date Indefinite  [facility]  Preferred lab   Send INR reminders to    Indications  ATRIAL FIBRILLATION PAROXYSMAL CHRONIC (Resolved) [I48.91] Acute ischemic VBA thalamic stroke [I63.219 I63.22] Chronic anticoagulation on coumadin [Z79.01]        Comments         ANTICOAG TODAY: Anticoagulation Summary as of 10/18/2014    INR goal 2.0-3.0  Selected INR 2.70 (10/18/2014)  Next INR check 11/08/2014  Target end date Indefinite   Indications  ATRIAL FIBRILLATION PAROXYSMAL CHRONIC (Resolved) [I48.91] Acute ischemic VBA thalamic stroke [I63.219 I63.22] Chronic anticoagulation on coumadin [Z79.01]      Anticoagulation Episode Summary    [facility] check [facility]   Preferred lab    Send INR reminders to    Comments       PATIENT INSTRUCTIONS: Patient Instructions  Patient instructed to take medications as defined in the Anti-coagulation Track section of this encounter.  Patient instructed to take today's dose.  Patient verbalized understanding of these instructions.       FOLLOW-UP Return in 3 weeks (on 11/08/2014) for Follow up INR at 2:30PM.  [name], III Pharm.D., CACP

## 2014-10-19 LAB — COMPLETE METABOLIC PANEL WITH GFR
ALT: 12 U/L (ref 0–53)
AST: 16 U/L (ref 0–37)
Albumin: 4.3 g/dL (ref 3.5–5.2)
Alkaline Phosphatase: 72 U/L (ref 39–117)
BUN: 35 mg/dL — ABNORMAL HIGH (ref 6–23)
CALCIUM: 9.3 mg/dL (ref 8.4–10.5)
CO2: 25 meq/L (ref 19–32)
CREATININE: 2.24 mg/dL — AB (ref 0.50–1.35)
Chloride: 105 mEq/L (ref 96–112)
GFR, Est African American: 34 mL/min — ABNORMAL LOW
GFR, Est Non African American: 29 mL/min — ABNORMAL LOW
Glucose, Bld: 114 mg/dL — ABNORMAL HIGH (ref 70–99)
Potassium: 4.2 mEq/L (ref 3.5–5.3)
SODIUM: 142 meq/L (ref 135–145)
TOTAL PROTEIN: 6.8 g/dL (ref 6.0–8.3)
Total Bilirubin: 0.6 mg/dL (ref 0.2–1.2)

## 2014-10-19 LAB — MICROALBUMIN / CREATININE URINE RATIO
CREATININE, URINE: 158.3 mg/dL
Microalb Creat Ratio: 599.5 mg/g — ABNORMAL HIGH (ref 0.0–30.0)
Microalb, Ur: 94.9 mg/dL — ABNORMAL HIGH (ref <2.0)

## 2014-10-19 LAB — CBC
HEMATOCRIT: 41.3 % (ref 39.0–52.0)
Hemoglobin: 13.1 g/dL (ref 13.0–17.0)
MCH: 25.4 pg — ABNORMAL LOW (ref 26.0–34.0)
MCHC: 31.7 g/dL (ref 30.0–36.0)
MCV: 80.2 fL (ref 78.0–100.0)
Platelets: 197 10*3/uL (ref 150–400)
RBC: 5.15 MIL/uL (ref 4.22–5.81)
RDW: 15.5 % (ref 11.5–15.5)
WBC: 4.5 10*3/uL (ref 4.0–10.5)

## 2014-10-19 LAB — URIC ACID: Uric Acid, Serum: 6.2 mg/dL (ref 4.0–7.8)

## 2014-10-19 LAB — TSH: TSH: 2.291 u[IU]/mL (ref 0.350–4.500)

## 2014-10-20 LAB — ALDOSTERONE + RENIN ACTIVITY W/ RATIO
ALDO / PRA Ratio: 10.6 (ref 0.0–30.0)
Aldosterone: 22.2 ng/dL (ref 0.0–30.0)
PRA LC/MS/MS: 2.1 ng/mL/hr

## 2014-10-20 NOTE — Assessment & Plan Note (Addendum)
Assessment: Pt with poorly controlled hypertension with unclear compliance with five-class (CCB, alpha-2 agonist, nitrate, ACEi, & diuretic) anti-hypertensive therapy who presents with blood pressure of 156/87.   Plan:  -BP 156/87 not at goal <140/90  -Unclear compliance, stressed importance of compliance with taking clonidine 0.3 mg BID, diltiazem 30 mg TID, hydralazine 100 mg TID, lisinopril 10 mg daily, and furosemide 20 mg daily -Obtain CMP ---> K normal with stable CKD Stage 3 with Cr 2.2 and GFR 34, pt instructed to discontinue potassium supplementation kdur 10 mEq BID which he is not taking  -Obtain TSH level ---> normal  -Obtain renin and aldosterone levels to assess for primary hyperaldosteronism due to history of chronic hypokalemia and refractory hypertension ---> no evidence of primary hyperaldosteronism

## 2014-10-20 NOTE — Assessment & Plan Note (Signed)
Assessment: Pt with non-tophaceous gout partially compliant with uric acid lowering therapy with last uric acid level of 6.2 on 03/29/14 who presents with no recent flare.   Plan: -Obtain uric acid level ---> 6.2 near goal <6 -Continue allopurinol 100 mg BID, however pt reports taking 100 mg daily

## 2014-10-20 NOTE — Assessment & Plan Note (Addendum)
Assessment: Pt with last A1c of 9.3 on 03/29/14 not compliant with insulin and compliant with oral hypoglycemic therapy with recent hypoglycemia who presents with CBG of 109 and significantly improved A1c of 6.2.   Plan:  -A1c 6.2  at goal <7, discontinue insulin as pt no longer taking and glipizide 5 mg daily due to hypoglycemia. Continue metformin 500 mg daily (pt not taking BID).  -BP 156/87 not at goal <140/90, stressed importance of compliance with clonidine 0.3 mg BID, diltiazem 30 mg TID, hydralazine 100 mg TID, lisinopril 10 mg daily, and furosemide 20 mg daily -LDL 33 at goal <100, continue pravastatin 80 mg daily, consider decreasing to 40 mg daily after results of next lipid panel  -Last annual eye exam on 06/30/14 with sever NPDR and macular edema   -Last annual foot exam on 03/29/14 -Obtain annual urine microalbumin ---> 599.5 mg proteinuria, continue lisinopril 10 mg daily -BMI 28.28 at goal <30 -Continue aspirin 81 mg for secondary CVD prevention

## 2014-10-20 NOTE — Progress Notes (Signed)
Case discussed with Dr. Rabbani soon after the resident saw the patient.  We reviewed the resident's history and exam and pertinent patient test results.  I agree with the assessment, diagnosis, and plan of care documented in the resident's note. 

## 2014-10-20 NOTE — Assessment & Plan Note (Signed)
Assessment: Pt with CKD Stage 3 with chronic microcytic to normocytic anemia with baseline Hg 11, last anemia panel on 03/19/14 consistent with ACD, and colonoscopy in 2009 with benign polyps on chronic AC therapy who presents with no active bleeding or hemodynamic instability.   Plan:  -Obtain CBC w/o diff ---> Hg improved to normal 13.1

## 2014-10-20 NOTE — Assessment & Plan Note (Signed)
Assessment: Pt with permanent atrial fibrillation on rate control and AC therapy who presents with normal rate and irregularly irregular rhythm.   Plan: -Obtain POCT INR ----> 2.7  therapeutic  -Continue coumadin per Dr. Gladstone Pih instructions today for [facility] therapy  -Continue diltiazem 30 mg TID for rate control

## 2014-10-20 NOTE — Progress Notes (Signed)
Indication: Permanent atrial fibrillation.  Duration: Lifelong.  INR at target.  Agree with Dr. Gladstone Pih assessment and plan as documented.

## 2014-10-20 NOTE — Assessment & Plan Note (Signed)
Pt declined zoster and pneumococcal vaccination today on 10/18/14, inquire again at next visit.

## 2014-11-03 ENCOUNTER — Other Ambulatory Visit: Payer: Self-pay | Admitting: *Deleted

## 2014-11-04 MED ORDER — LISINOPRIL 10 MG PO TABS: 10.0000 mg | ORAL_TABLET | Freq: Every day | ORAL | Status: DC

## 2014-11-08 ENCOUNTER — Ambulatory Visit (INDEPENDENT_AMBULATORY_CARE_PROVIDER_SITE_OTHER): Payer: Medicare Other | Admitting: Pharmacist

## 2014-11-08 DIAGNOSIS — I6381 Other cerebral infarction due to occlusion or stenosis of small artery: Secondary | ICD-10-CM

## 2014-11-08 DIAGNOSIS — Z7901 Long term (current) use of anticoagulants: Secondary | ICD-10-CM | POA: Diagnosis present

## 2014-11-08 DIAGNOSIS — I4891 Unspecified atrial fibrillation: Secondary | ICD-10-CM

## 2014-11-08 DIAGNOSIS — I63219 Cerebral infarction due to unspecified occlusion or stenosis of unspecified vertebral arteries: Secondary | ICD-10-CM

## 2014-11-08 DIAGNOSIS — I6322 Cerebral infarction due to unspecified occlusion or stenosis of basilar arteries: Secondary | ICD-10-CM

## 2014-11-08 LAB — POCT INR: INR: 1.2

## 2014-11-08 NOTE — Patient Instructions (Signed)
Patient instructed to take medications as defined in the Anti-coagulation Track section of this encounter.  Patient instructed to take today's dose.  Patient instructed to always the call office for refills prior to running out of medication. Patient verbalized understanding of these instructions.

## 2014-11-08 NOTE — Progress Notes (Signed)
Anti-Coagulation Progress Note  Patrick [name] is a 69 y.o. male who is currently on an anti-coagulation regimen.  RECENT RESULTS:  Recent results are below, the most recent result is correlated with a dose of 35 mg. per week: However patient explained that he has missed 4-5 doses over the past week due to running out of his medication. Lab Results  Component Value Date   INR 1.2 11/08/2014   INR 2.70 10/18/2014   INR 2.8 09/27/2014    ANTI-COAG DOSE:  Anticoagulation Dose Instructions as of 11/08/2014      [name] Tue Wed Thu Fri Sat   New Dose 5 mg 5 mg 5 mg 5 mg 5 mg 5 mg 5 mg      ANTICOAG SUMMARY:  Anticoagulation Episode Summary    Current INR goal 2.0-3.0  Next INR check 11/15/2014  INR from last check 1.2! (11/08/2014)  Weekly max dose   Target end date Indefinite  INR check location [facility]  Preferred lab   Send INR reminders to    Indications  ATRIAL FIBRILLATION PAROXYSMAL CHRONIC (Resolved) [I48.91] Acute ischemic VBA thalamic stroke [I63.219 I63.22] Chronic anticoagulation on coumadin [Z79.01]        Comments         ANTICOAG TODAY:  Anticoagulation Summary as of 11/08/2014    INR goal 2.0-3.0  Selected INR 1.2! (11/08/2014)  Next INR check 11/15/2014  Target end date Indefinite   Indications  ATRIAL FIBRILLATION PAROXYSMAL CHRONIC (Resolved) [I48.91] Acute ischemic VBA thalamic stroke [I63.219 I63.22] Chronic anticoagulation on coumadin [Z79.01]      Anticoagulation Episode Summary    INR check location [facility]   Preferred lab    Send INR reminders to    Comments       PATIENT INSTRUCTIONS:  Patient Instructions  Patient instructed to take medications as defined in the Anti-coagulation Track section of this encounter.  Patient instructed to take today's dose.  Patient instructed to always the call office for refills prior to running out of medication. Patient verbalized understanding of these instructions.        FOLLOW-UP   Return in about 1 week (around 11/15/2014) for Follow up INR @ 2pm.   Thank you for allowing pharmacy to be part of this patient's care team  [place], Pharm.D Clinical Pharmacy Resident Pager: [phone] 11/08/2014 .3:11 PM

## 2014-11-15 ENCOUNTER — Ambulatory Visit (INDEPENDENT_AMBULATORY_CARE_PROVIDER_SITE_OTHER): Payer: Medicare Other | Admitting: Pharmacist

## 2014-11-15 DIAGNOSIS — I63219 Cerebral infarction due to unspecified occlusion or stenosis of unspecified vertebral arteries: Secondary | ICD-10-CM | POA: Diagnosis not present

## 2014-11-15 DIAGNOSIS — I6381 Other cerebral infarction due to occlusion or stenosis of small artery: Secondary | ICD-10-CM

## 2014-11-15 DIAGNOSIS — I6322 Cerebral infarction due to unspecified occlusion or stenosis of basilar arteries: Secondary | ICD-10-CM

## 2014-11-15 DIAGNOSIS — Z7901 Long term (current) use of anticoagulants: Secondary | ICD-10-CM | POA: Diagnosis present

## 2014-11-15 LAB — POCT INR: INR: 2.1

## 2014-11-15 NOTE — Patient Instructions (Signed)
Patient instructed to take medications as defined in the Anti-coagulation Track section of this encounter.  Patient instructed to take today's dose.  Patient verbalized understanding of these instructions.    

## 2014-11-15 NOTE — Progress Notes (Signed)
Anti-Coagulation Progress Note  Patrick [name] is a 69 y.o. male who is currently on an anti-coagulation regimen.    RECENT RESULTS: Recent results are below, the most recent result is correlated with a dose of 35 mg. per week: Lab Results  Component Value Date   INR 2.10 11/15/2014   INR 1.2 11/08/2014   INR 2.70 10/18/2014    ANTI-COAG DOSE: Anticoagulation Dose Instructions as of 11/15/2014      [name] Tue Wed Thu Fri Sat   New Dose 5 mg 7.5 mg 5 mg 7.5 mg 5 mg 7.5 mg 5 mg       ANTICOAG SUMMARY: Anticoagulation Episode Summary    Current INR goal 2.0-3.0  Next INR check 12/06/2014  INR from last check 2.10 (11/15/2014)  Weekly max dose   Target end date Indefinite  [facility] check location [facility]  Preferred lab   Send INR reminders to    Indications  ATRIAL FIBRILLATION PAROXYSMAL CHRONIC (Resolved) [I48.91] Acute ischemic VBA thalamic stroke [I63.219 I63.22] Chronic anticoagulation on coumadin [Z79.01]        Comments         ANTICOAG TODAY: Anticoagulation Summary as of 11/15/2014    INR goal 2.0-3.0  Selected INR 2.10 (11/15/2014)  Next INR check 12/06/2014  Target end date Indefinite   Indications  ATRIAL FIBRILLATION PAROXYSMAL CHRONIC (Resolved) [I48.91] Acute ischemic VBA thalamic stroke [I63.219 I63.22] Chronic anticoagulation on coumadin [Z79.01]      Anticoagulation Episode Summary    INR check location [facility]   Preferred lab    Send INR reminders to    Comments       PATIENT INSTRUCTIONS: Patient Instructions  Patient instructed to take medications as defined in the Anti-coagulation Track section of this encounter.  Patient instructed to take today's dose.  Patient verbalized understanding of these instructions.       FOLLOW-UP Return in 3 weeks (on 12/06/2014) for Follow up INR at 4:15PM.  [name], III Pharm.D., CACP

## 2014-12-06 ENCOUNTER — Ambulatory Visit (INDEPENDENT_AMBULATORY_CARE_PROVIDER_SITE_OTHER): Payer: Medicare Other | Admitting: Pharmacist

## 2014-12-06 DIAGNOSIS — I63219 Cerebral infarction due to unspecified occlusion or stenosis of unspecified vertebral arteries: Secondary | ICD-10-CM

## 2014-12-06 DIAGNOSIS — Z7901 Long term (current) use of anticoagulants: Secondary | ICD-10-CM

## 2014-12-06 DIAGNOSIS — I6381 Other cerebral infarction due to occlusion or stenosis of small artery: Secondary | ICD-10-CM

## 2014-12-06 DIAGNOSIS — I6322 Cerebral infarction due to unspecified occlusion or stenosis of basilar arteries: Secondary | ICD-10-CM

## 2014-12-06 DIAGNOSIS — Z8673 Personal history of transient ischemic attack (TIA), and cerebral infarction without residual deficits: Secondary | ICD-10-CM | POA: Diagnosis not present

## 2014-12-06 LAB — POCT INR: INR: 1.6

## 2014-12-06 NOTE — Patient Instructions (Signed)
Patient instructed to take medications as defined in the Anti-coagulation Track section of this encounter.  Patient instructed to take today's dose.  Patient verbalized understanding of these instructions.    

## 2014-12-06 NOTE — Progress Notes (Signed)
Anti-Coagulation Progress Note  Patrick [name] is a 69 y.o. male who is currently on an anti-coagulation regimen.    RECENT RESULTS: Recent results are below, the most recent result is correlated with a dose of 42.5 mg. per week: Lab Results  Component Value Date   INR 1.60 12/06/2014   INR 2.10 11/15/2014   INR 1.2 11/08/2014    ANTI-COAG DOSE: Anticoagulation Dose Instructions as of 12/06/2014      Sun Mon Tue Wed Thu Fri Sat   New Dose 5 mg 7.5 mg 7.5 mg 7.5 mg 7.5 mg 7.5 mg 5 mg       ANTICOAG SUMMARY: Anticoagulation Episode Summary    Current INR goal 2.0-3.0  Next INR check 12/20/2014  INR from last check 1.60! (12/06/2014)  Weekly max dose   Target end date Indefinite  [facility]R check location [facility]  Preferred lab   Send INR reminders to    Indications  ATRIAL FIBRILLATION PAROXYSMAL CHRONIC (Resolved) [I48.91] Acute ischemic VBA thalamic stroke [I63.219 I63.22] Chronic anticoagulation on coumadin [Z79.01]        Comments         ANTICOAG TODAY: Anticoagulation Summary as of 12/06/2014    INR goal 2.0-3.0  Selected INR 1.60! (12/06/2014)  Next INR check 12/20/2014  Target end date Indefinite   Indications  ATRIAL FIBRILLATION PAROXYSMAL CHRONIC (Resolved) [I48.91] Acute ischemic VBA thalamic stroke [I63.219 I63.22] Chronic anticoagulation on coumadin [Z79.01]      Anticoagulation Episode Summary    [facility]R check location [facility]   Preferred lab    Send INR reminders to    Comments       PATIENT INSTRUCTIONS: Patient Instructions  Patient instructed to take medications as defined in the Anti-coagulation Track section of this encounter.  Patient instructed to take today's dose.  Patient verbalized understanding of these instructions.       FOLLOW-UP Return in 2 weeks (on 12/20/2014) for Follow up INR at 3:30PM.  [name], III Pharm.D., CACP

## 2014-12-07 NOTE — Progress Notes (Signed)
INTERNAL MEDICINE TEACHING ATTENDING ADDENDUM - Mertha Clyatt M.D  Duration- indefinite, Indication- afib, INR- sub therapeutic. Agree with pharmacy recommendations as outlined in their note.      

## 2014-12-20 ENCOUNTER — Ambulatory Visit (INDEPENDENT_AMBULATORY_CARE_PROVIDER_SITE_OTHER): Payer: Medicare Other | Admitting: Pharmacist

## 2014-12-20 DIAGNOSIS — Z7901 Long term (current) use of anticoagulants: Secondary | ICD-10-CM | POA: Diagnosis present

## 2014-12-20 DIAGNOSIS — Z8673 Personal history of transient ischemic attack (TIA), and cerebral infarction without residual deficits: Secondary | ICD-10-CM

## 2014-12-20 DIAGNOSIS — I6322 Cerebral infarction due to unspecified occlusion or stenosis of basilar arteries: Secondary | ICD-10-CM

## 2014-12-20 DIAGNOSIS — I63219 Cerebral infarction due to unspecified occlusion or stenosis of unspecified vertebral arteries: Secondary | ICD-10-CM

## 2014-12-20 DIAGNOSIS — I6381 Other cerebral infarction due to occlusion or stenosis of small artery: Secondary | ICD-10-CM

## 2014-12-20 LAB — POCT INR: INR: 4.1

## 2014-12-20 NOTE — Patient Instructions (Signed)
Patient instructed to take medications as defined in the Anti-coagulation Track section of this encounter.  Patient instructed to hold today's dose and begin new regimen tomorrow. Patient verbalized understanding of these instructions.

## 2014-12-20 NOTE — Progress Notes (Signed)
Anti-Coagulation Progress Note  Patrick [name] is a 69 y.o. male who is currently on an anti-coagulation regimen.  RECENT RESULTS:  Recent results are below, the most recent result is correlated with a dose of 47.5 mg. per week:  Lab Results  Component Value Date   INR 4.1 12/20/2014   INR 1.60 12/06/2014   INR 2.10 11/15/2014    ANTI-COAG DOSE:  Anticoagulation Dose Instructions as of 12/20/2014      [name] Tue Wed Thu Fri Sat   New Dose 7.5 mg 5 mg 7.5 mg 5 mg 7.5 mg 5 mg 7.5 mg    Description        Take 1 tablet on Mondays, Wednesdays, and Fridays then take 1&1/2 tablets on other days ( Tuesday, Thursdays, Saturday, and Sunday)      ANTICOAG SUMMARY:  Anticoagulation Episode Summary    Current INR goal 2.0-3.0  Next INR check 01/03/2015  INR from last check 4.1! (12/20/2014)  Weekly max dose   Target end date Indefinite  [facility]R check location [facility]  Preferred lab   Send INR reminders to    Indications  ATRIAL FIBRILLATION PAROXYSMAL CHRONIC (Resolved) [I48.91] Acute ischemic VBA thalamic stroke [I63.219 I63.22] Chronic anticoagulation on coumadin [Z79.01]        Comments         ANTICOAG TODAY:  Anticoagulation Summary as of 12/20/2014    INR goal 2.0-3.0  Selected INR 4.1! (12/20/2014)  Next INR check 01/03/2015  Target end date Indefinite   Indications  ATRIAL FIBRILLATION PAROXYSMAL CHRONIC (Resolved) [I48.91] Acute ischemic VBA thalamic stroke [I63.219 I63.22] Chronic anticoagulation on coumadin [Z79.01]      Anticoagulation Episode Summary    INR check location [facility]   Preferred lab    Send INR reminders to    Comments       PATIENT INSTRUCTIONS:  Patient Instructions  Patient instructed to take medications as defined in the Anti-coagulation Track section of this encounter.  Patient instructed to hold today's dose and begin new regimen tomorrow. Patient verbalized understanding of these instructions.        FOLLOW-UP   Return in 2 weeks (on 01/03/2015) for Follow up INR @ 3pm.    Thank you for allowing pharmacy to be part of this patient's care team  [place], Pharm.D Clinical Pharmacy Resident Pager: [phone] 12/20/2014 .3:29 PM

## 2014-12-21 NOTE — Progress Notes (Signed)
Indication: Permanent atrial fibrillation. Duration: Lifelong. INR: Above target. Agree with Dr. Jan Fireman assessment and plan.

## 2014-12-30 ENCOUNTER — Telehealth: Payer: Self-pay | Admitting: Pharmacist

## 2014-12-30 NOTE — Telephone Encounter (Signed)
Call to patient to confirm appointment for 01/03/15 at 3:00 lmtcb

## 2015-01-03 ENCOUNTER — Ambulatory Visit (INDEPENDENT_AMBULATORY_CARE_PROVIDER_SITE_OTHER): Payer: Medicare Other | Admitting: Pharmacist

## 2015-01-03 DIAGNOSIS — I63219 Cerebral infarction due to unspecified occlusion or stenosis of unspecified vertebral arteries: Secondary | ICD-10-CM

## 2015-01-03 DIAGNOSIS — Z8673 Personal history of transient ischemic attack (TIA), and cerebral infarction without residual deficits: Secondary | ICD-10-CM | POA: Diagnosis not present

## 2015-01-03 DIAGNOSIS — I6381 Other cerebral infarction due to occlusion or stenosis of small artery: Secondary | ICD-10-CM

## 2015-01-03 DIAGNOSIS — Z7901 Long term (current) use of anticoagulants: Secondary | ICD-10-CM

## 2015-01-03 DIAGNOSIS — I6322 Cerebral infarction due to unspecified occlusion or stenosis of basilar arteries: Secondary | ICD-10-CM

## 2015-01-03 LAB — POCT INR: INR: 4.3

## 2015-01-03 NOTE — Patient Instructions (Signed)
Patient instructed to take medications as defined in the Anti-coagulation Track section of this encounter.  Patient instructed to OMIT today's dose.  Patient verbalized understanding of these instructions.    

## 2015-01-03 NOTE — Progress Notes (Signed)
Anti-Coagulation Progress Note  Patrick [name] is a 69 y.o. male who is currently on an anti-coagulation regimen.    RECENT RESULTS: Recent results are below, the most recent result is correlated with a dose of 45 mg. per week:  Will OMIT today's dose and reduce weekly dose. Wife accompanied the patient today with questions of INR range (time in target range). We discussed all issues surrounding ability to keep INR in TTR. We discussed the possibility of a DOAC. Wife and patient were made aware of the options and that Dr. Maudie Mercury or I will explore his insurance coverage by his Medicare Plan to determine what his copay would be for a DOAC and that at next visit we would discuss this option based upon co-pay and additional clinical factors upon what such a decision would be made.  Lab Results  Component Value Date   INR 4.30 01/03/2015   INR 4.1 12/20/2014   INR 1.60 12/06/2014    ANTI-COAG DOSE: Anticoagulation Dose Instructions as of 01/03/2015      [name] Tue Wed Thu Fri Sat   New Dose 5 mg 0 mg 7.5 mg 5 mg 5 mg 7.5 mg 5 mg       ANTICOAG SUMMARY: Anticoagulation Episode Summary    Current INR goal 2.0-3.0  Next INR check 01/10/2015  INR from last check 4.30! (01/03/2015)  Weekly max dose   Target end date Indefinite  [facility]R check location [facility]  Preferred lab   Send INR reminders to    Indications  ATRIAL FIBRILLATION PAROXYSMAL CHRONIC (Resolved) [I48.91] Acute ischemic VBA thalamic stroke [I63.219 I63.22] Chronic anticoagulation on coumadin [Z79.01]        Comments         ANTICOAG TODAY: Anticoagulation Summary as of 01/03/2015    INR goal 2.0-3.0  Selected INR 4.30! (01/03/2015)  Next INR check 01/10/2015  Target end date Indefinite   Indications  ATRIAL FIBRILLATION PAROXYSMAL CHRONIC (Resolved) [I48.91] Acute ischemic VBA thalamic stroke [I63.219 I63.22] Chronic anticoagulation on coumadin [Z79.01]      Anticoagulation Episode Summary    INR check  location [facility]   Preferred lab    Send INR reminders to    Comments       PATIENT INSTRUCTIONS: Patient Instructions  Patient instructed to take medications as defined in the Anti-coagulation Track section of this encounter.  Patient instructed to OMIT today's dose.  Patient verbalized understanding of these instructions.       FOLLOW-UP Return in 7 days (on 01/10/2015) for Follow up INR and discussion for potential role of a DOAC. [name], III Pharm.D., CACP

## 2015-01-10 ENCOUNTER — Ambulatory Visit (INDEPENDENT_AMBULATORY_CARE_PROVIDER_SITE_OTHER): Payer: Medicare Other | Admitting: Pharmacist

## 2015-01-10 DIAGNOSIS — Z7901 Long term (current) use of anticoagulants: Secondary | ICD-10-CM | POA: Diagnosis present

## 2015-01-10 DIAGNOSIS — I6381 Other cerebral infarction due to occlusion or stenosis of small artery: Secondary | ICD-10-CM

## 2015-01-10 DIAGNOSIS — I63219 Cerebral infarction due to unspecified occlusion or stenosis of unspecified vertebral arteries: Secondary | ICD-10-CM | POA: Diagnosis not present

## 2015-01-10 DIAGNOSIS — I6322 Cerebral infarction due to unspecified occlusion or stenosis of basilar arteries: Secondary | ICD-10-CM

## 2015-01-10 LAB — POCT INR: INR: 4.3

## 2015-01-10 NOTE — Progress Notes (Signed)
Anti-Coagulation Progress Note  Patrick [name] is a 69 y.o. male who is currently on an anti-coagulation regimen.    RECENT RESULTS: Recent results are below, the most recent result is correlated with a dose of 35 mg. per week--with ONE dose omitted last week: Lab Results  Component Value Date   INR 4.30 01/10/2015   INR 4.30 01/03/2015   INR 4.1 12/20/2014    ANTI-COAG DOSE: Anticoagulation Dose Instructions as of 01/10/2015      [name] Tue Wed Thu Fri Sat   New Dose 5 mg 5 mg 5 mg 5 mg 5 mg 7.5 mg 5 mg       ANTICOAG SUMMARY: Anticoagulation Episode Summary    Current INR goal 2.0-3.0  Next INR check 02/07/2015  INR from last check 4.30! (01/10/2015)  Weekly max dose   Target end date Indefinite  [facility]R check location [facility]  Preferred lab   Send INR reminders to    Indications  ATRIAL FIBRILLATION PAROXYSMAL CHRONIC (Resolved) [I48.91] Acute ischemic VBA thalamic stroke [I63.219 I63.22] Chronic anticoagulation on coumadin [Z79.01]        Comments         ANTICOAG TODAY: Anticoagulation Summary as of 01/10/2015    INR goal 2.0-3.0  Selected INR 4.30! (01/10/2015)  Next INR check 02/07/2015  Target end date Indefinite   Indications  ATRIAL FIBRILLATION PAROXYSMAL CHRONIC (Resolved) [I48.91] Acute ischemic VBA thalamic stroke [I63.219 I63.22] Chronic anticoagulation on coumadin [Z79.01]      Anticoagulation Episode Summary    INR check location [facility]   Preferred lab    Send INR reminders to    Comments       PATIENT INSTRUCTIONS: Patient Instructions  Patient instructed to take medications as defined in the Anti-coagulation Track section of this encounter.  Patient instructed to takeoday's dose.  Patient verbalized understanding of these instructions.       FOLLOW-UP Return in 4 weeks (on 02/07/2015) for Follow up INR at 1415h.  [name], III Pharm.D., CACP  Anti-Coagulation Progress Note  Pharrell Nikas is a 69 y.o. male  who is currently on an anti-coagulation regimen.    RECENT RESULTS: Recent results are below, the most recent result is correlated with a dose of 35 mg. per week: Lab Results  Component Value Date   INR 4.30 01/10/2015   INR 4.30 01/03/2015   INR 4.1 12/20/2014    ANTI-COAG DOSE: Anticoagulation Dose Instructions as of 01/10/2015      [name] Tue Wed Thu Fri Sat   New Dose 5 mg 5 mg 5 mg 5 mg 5 mg 7.5 mg 5 mg       ANTICOAG SUMMARY: Anticoagulation Episode Summary    Current INR goal 2.0-3.0  Next INR check 02/07/2015  INR from last check 4.30! (01/10/2015)  Weekly max dose   Target end date Indefinite  INR check location [facility]  Preferred lab   Send INR reminders to    Indications  ATRIAL FIBRILLATION PAROXYSMAL CHRONIC (Resolved) [I48.91] Acute ischemic VBA thalamic stroke [I63.219 I63.22] Chronic anticoagulation on coumadin [Z79.01]        Comments         ANTICOAG TODAY: Anticoagulation Summary as of 01/10/2015    INR goal 2.0-3.0  Selected INR 4.30! (01/10/2015)  Next INR check 02/07/2015  Target end date Indefinite   Indications  ATRIAL FIBRILLATION PAROXYSMAL CHRONIC (Resolved) [I48.91] Acute ischemic VBA thalamic stroke [I63.219 I63.22] Chronic anticoagulation on coumadin [Z79.01]  Anticoagulation Episode Summary    INR check location [facility]   Preferred lab    Send INR reminders to    Comments       PATIENT INSTRUCTIONS: Patient Instructions  Patient instructed to take medications as defined in the Anti-coagulation Track section of this encounter.  Patient instructed to takeoday's dose.  Patient verbalized understanding of these instructions.       FOLLOW-UP Return in 4 weeks (on 02/07/2015) for Follow up INR at 1415h.  [name], III Pharm.D., CACP

## 2015-01-10 NOTE — Patient Instructions (Signed)
Patient instructed to take medications as defined in the Anti-coagulation Track section of this encounter.  Patient instructed to takeoday's dose.  Patient verbalized understanding of these instructions.

## 2015-01-25 ENCOUNTER — Other Ambulatory Visit: Payer: Self-pay | Admitting: Pulmonary Disease

## 2015-01-25 ENCOUNTER — Other Ambulatory Visit: Payer: Self-pay | Admitting: Internal Medicine

## 2015-02-07 ENCOUNTER — Ambulatory Visit: Payer: Medicare Other

## 2015-02-10 ENCOUNTER — Encounter: Payer: Medicare Other | Admitting: Pulmonary Disease

## 2015-02-14 ENCOUNTER — Ambulatory Visit (INDEPENDENT_AMBULATORY_CARE_PROVIDER_SITE_OTHER): Payer: Medicare Other | Admitting: Pharmacist

## 2015-02-14 DIAGNOSIS — Z8673 Personal history of transient ischemic attack (TIA), and cerebral infarction without residual deficits: Secondary | ICD-10-CM

## 2015-02-14 DIAGNOSIS — Z7901 Long term (current) use of anticoagulants: Secondary | ICD-10-CM | POA: Diagnosis present

## 2015-02-14 DIAGNOSIS — I6381 Other cerebral infarction due to occlusion or stenosis of small artery: Secondary | ICD-10-CM

## 2015-02-14 DIAGNOSIS — I6322 Cerebral infarction due to unspecified occlusion or stenosis of basilar arteries: Secondary | ICD-10-CM

## 2015-02-14 DIAGNOSIS — I63219 Cerebral infarction due to unspecified occlusion or stenosis of unspecified vertebral arteries: Secondary | ICD-10-CM

## 2015-02-14 DIAGNOSIS — I480 Paroxysmal atrial fibrillation: Secondary | ICD-10-CM | POA: Diagnosis not present

## 2015-02-14 LAB — POCT INR: INR: 2.9

## 2015-02-14 NOTE — Progress Notes (Signed)
Anti-Coagulation Progress Note  Patrick [name] is a 69 y.o. male who is currently on an anti-coagulation regimen.    RECENT RESULTS: Recent results are below, the most recent result is correlated with a dose of 37.5 mg. per week: Lab Results  Component Value Date   INR 2.90 02/14/2015   INR 4.30 01/10/2015   INR 4.30 01/03/2015    ANTI-COAG DOSE: Anticoagulation Dose Instructions as of 02/14/2015      [name] Tue Wed Thu Fri Sat   New Dose 5 mg 2.5 mg 5 mg 5 mg 5 mg 5 mg 5 mg       ANTICOAG SUMMARY: Anticoagulation Episode Summary    Current INR goal 2.0-3.0  Next INR check 03/14/2015  INR from last check 2.90 (02/14/2015)  Weekly max dose   Target end date Indefinite  [facility]R check location [facility]  Preferred lab   Send INR reminders to    Indications  ATRIAL FIBRILLATION PAROXYSMAL CHRONIC (Resolved) [I48.91] Acute ischemic VBA thalamic stroke [I63.219 I63.22] Chronic anticoagulation on coumadin [Z79.01]        Comments         ANTICOAG TODAY: Anticoagulation Summary as of 02/14/2015    INR goal 2.0-3.0  Selected INR 2.90 (02/14/2015)  Next INR check 03/14/2015  Target end date Indefinite   Indications  ATRIAL FIBRILLATION PAROXYSMAL CHRONIC (Resolved) [I48.91] Acute ischemic VBA thalamic stroke [I63.219 I63.22] Chronic anticoagulation on coumadin [Z79.01]      Anticoagulation Episode Summary    INR check location [facility]   Preferred lab    Send INR reminders to    Comments       PATIENT INSTRUCTIONS: Patient Instructions  Patient instructed to take medications as defined in the Anti-coagulation Track section of this encounter.  Patient instructed to take today's dose.  Patient verbalized understanding of these instructions.       FOLLOW-UP Return in about 4 weeks (around 03/14/2015) for Follow up INR at 2:15PM.  [name], III Pharm.D., CACP

## 2015-02-14 NOTE — Patient Instructions (Signed)
Patient instructed to take medications as defined in the Anti-coagulation Track section of this encounter.  Patient instructed to take today's dose.  Patient verbalized understanding of these instructions.    

## 2015-03-14 ENCOUNTER — Ambulatory Visit (INDEPENDENT_AMBULATORY_CARE_PROVIDER_SITE_OTHER): Payer: Medicare Other | Admitting: Pharmacist

## 2015-03-14 DIAGNOSIS — I63219 Cerebral infarction due to unspecified occlusion or stenosis of unspecified vertebral arteries: Secondary | ICD-10-CM | POA: Diagnosis not present

## 2015-03-14 DIAGNOSIS — Z7901 Long term (current) use of anticoagulants: Secondary | ICD-10-CM

## 2015-03-14 DIAGNOSIS — I6381 Other cerebral infarction due to occlusion or stenosis of small artery: Secondary | ICD-10-CM

## 2015-03-14 DIAGNOSIS — I6322 Cerebral infarction due to unspecified occlusion or stenosis of basilar arteries: Secondary | ICD-10-CM

## 2015-03-14 LAB — POCT INR: INR: 3

## 2015-03-14 NOTE — Progress Notes (Signed)
Anti-Coagulation Progress Note  Patrick [name] is a 69 y.o. male who is currently on an anti-coagulation regimen.    RECENT RESULTS: Recent results are below, the most recent result is correlated with a dose of 32.5 mg. per week: Lab Results  Component Value Date   INR 3.00 03/14/2015   INR 2.90 02/14/2015   INR 4.30 01/10/2015    ANTI-COAG DOSE: Anticoagulation Dose Instructions as of 03/14/2015      [name] Tue Wed Thu Fri Sat   New Dose 5 mg 2.5 mg 5 mg 5 mg 2.5 mg 5 mg 5 mg       ANTICOAG SUMMARY: Anticoagulation Episode Summary    Current INR goal 2.0-3.0  Next INR check 04/11/2015  INR from last check 3.00 (03/14/2015)  Weekly max dose   Target end date Indefinite  [facility]R check location [facility]  Preferred lab   Send INR reminders to    Indications  ATRIAL FIBRILLATION PAROXYSMAL CHRONIC (Resolved) [I48.91] Acute ischemic VBA thalamic stroke [I63.219 I63.22] Chronic anticoagulation on coumadin [Z79.01]        Comments         ANTICOAG TODAY: Anticoagulation Summary as of 03/14/2015    INR goal 2.0-3.0  Selected INR 3.00 (03/14/2015)  Next INR check 04/11/2015  Target end date Indefinite   Indications  ATRIAL FIBRILLATION PAROXYSMAL CHRONIC (Resolved) [I48.91] Acute ischemic VBA thalamic stroke [I63.219 I63.22] Chronic anticoagulation on coumadin [Z79.01]      Anticoagulation Episode Summary    INR check location [facility]   Preferred lab    Send INR reminders to    Comments       PATIENT INSTRUCTIONS: Patient Instructions  Patient instructed to take medications as defined in the Anti-coagulation Track section of this encounter.  Patient instructed to take today's dose.  Patient verbalized understanding of these instructions.       FOLLOW-UP Return in 4 weeks (on 04/11/2015) for Follow up INR at 3:30PM.  [name], III Pharm.D., CACP

## 2015-03-14 NOTE — Patient Instructions (Signed)
Patient instructed to take medications as defined in the Anti-coagulation Track section of this encounter.  Patient instructed to take today's dose.  Patient verbalized understanding of these instructions.    

## 2015-03-17 NOTE — Progress Notes (Signed)
I have reviewed Dr. Gladstone Pih note.  Patient is on anticoagulation for afib.  INR at high range of normal, coumadin decreased somewhat.

## 2015-03-29 ENCOUNTER — Other Ambulatory Visit: Payer: Self-pay | Admitting: Internal Medicine

## 2015-03-29 ENCOUNTER — Other Ambulatory Visit: Payer: Self-pay | Admitting: Pulmonary Disease

## 2015-04-11 ENCOUNTER — Ambulatory Visit (INDEPENDENT_AMBULATORY_CARE_PROVIDER_SITE_OTHER): Payer: Medicare Other | Admitting: Pharmacist

## 2015-04-11 DIAGNOSIS — I6322 Cerebral infarction due to unspecified occlusion or stenosis of basilar arteries: Secondary | ICD-10-CM

## 2015-04-11 DIAGNOSIS — I63219 Cerebral infarction due to unspecified occlusion or stenosis of unspecified vertebral arteries: Secondary | ICD-10-CM

## 2015-04-11 DIAGNOSIS — Z7901 Long term (current) use of anticoagulants: Secondary | ICD-10-CM | POA: Diagnosis present

## 2015-04-11 DIAGNOSIS — I482 Chronic atrial fibrillation: Secondary | ICD-10-CM | POA: Diagnosis not present

## 2015-04-11 DIAGNOSIS — I6381 Other cerebral infarction due to occlusion or stenosis of small artery: Secondary | ICD-10-CM

## 2015-04-11 DIAGNOSIS — Z8673 Personal history of transient ischemic attack (TIA), and cerebral infarction without residual deficits: Secondary | ICD-10-CM | POA: Diagnosis not present

## 2015-04-11 LAB — POCT INR: INR: 1.4

## 2015-04-11 NOTE — Progress Notes (Signed)
Anti-Coagulation Progress Note  Patrick [name] is a 69 y.o. male who is currently on an anti-coagulation regimen.    RECENT RESULTS: Recent results are below, the most recent result is correlated with a dose of 30 mg. per week: Lab Results  Component Value Date   INR 1.4 04/11/2015   INR 3.00 03/14/2015   INR 2.90 02/14/2015    ANTI-COAG DOSE:    ANTICOAG SUMMARY: Anticoagulation Episode Summary    Current INR goal 2.0-3.0  Next INR check 04/11/2015  INR from last check 3.00 (03/14/2015)  Most recent INR 1.4! (04/11/2015)  Weekly max dose   Target end date Indefinite  INR check location [facility]  Preferred lab   Send INR reminders to    Indications  ATRIAL FIBRILLATION PAROXYSMAL CHRONIC (Resolved) [I48.91] Acute ischemic VBA thalamic stroke [I63.219 I63.22] Chronic anticoagulation on coumadin [Z79.01]        Comments         ANTICOAG TODAY: Anticoagulation Summary as of 04/11/2015    INR goal 2.0-3.0  Selected INR   Next INR check   Target end date Indefinite   Indications  ATRIAL FIBRILLATION PAROXYSMAL CHRONIC (Resolved) [I48.91] Acute ischemic VBA thalamic stroke [I63.219 I63.22] Chronic anticoagulation on coumadin [Z79.01]      Anticoagulation Episode Summary    INR check location [facility]   Preferred lab    Send INR reminders to    Comments       PATIENT INSTRUCTIONS: Patient Instructions  Patient instructed to take medications as defined in the Anti-coagulation Track section of this encounter.  Patient instructed to take today's dose.  Patient verbalized understanding of these instructions.       FOLLOW-UP Return in 3 weeks (on 05/02/2015) for Follow up for INR at 2:30PM.  Joya San, PharmD Clinical Pharmacy Resident Pager # [phone] 04/11/2015 3:46 PM

## 2015-04-11 NOTE — Patient Instructions (Signed)
Patient instructed to take medications as defined in the Anti-coagulation Track section of this encounter.  Patient instructed to take today's dose.  Patient verbalized understanding of these instructions.    

## 2015-04-14 NOTE — Progress Notes (Signed)
INTERNAL MEDICINE TEACHING ATTENDING ADDENDUM - Lalla Brothers M.D  Duration- life long, Indication- atrial fibrillation, INR- below goal. Agree with pharmacy recommendations as outlined in their note.

## 2015-05-02 ENCOUNTER — Ambulatory Visit (INDEPENDENT_AMBULATORY_CARE_PROVIDER_SITE_OTHER): Payer: Medicare Other | Admitting: Pharmacist

## 2015-05-02 DIAGNOSIS — I6381 Other cerebral infarction due to occlusion or stenosis of small artery: Secondary | ICD-10-CM

## 2015-05-02 DIAGNOSIS — Z8673 Personal history of transient ischemic attack (TIA), and cerebral infarction without residual deficits: Secondary | ICD-10-CM

## 2015-05-02 DIAGNOSIS — Z7901 Long term (current) use of anticoagulants: Secondary | ICD-10-CM | POA: Diagnosis not present

## 2015-05-02 DIAGNOSIS — I6322 Cerebral infarction due to unspecified occlusion or stenosis of basilar arteries: Secondary | ICD-10-CM

## 2015-05-02 DIAGNOSIS — I63219 Cerebral infarction due to unspecified occlusion or stenosis of unspecified vertebral arteries: Secondary | ICD-10-CM

## 2015-05-02 DIAGNOSIS — I4891 Unspecified atrial fibrillation: Secondary | ICD-10-CM

## 2015-05-02 LAB — POCT INR: INR: 3.2

## 2015-05-02 NOTE — Progress Notes (Signed)
Anti-Coagulation Progress Note  Patrick [name] is a 69 y.o. male who is currently on an anti-coagulation regimen.    RECENT RESULTS: Recent results are below, the most recent result is correlated with a dose of 35 mg. per week: Lab Results  Component Value Date   INR 3.2 05/02/2015   INR 1.4 04/11/2015   INR 3.00 03/14/2015    ANTI-COAG DOSE: Anticoagulation Dose Instructions as of 05/02/2015      [name] Tue Wed Thu Fri Sat   New Dose 5 mg 2.5 mg 5 mg 5 mg 5 mg 5 mg 5 mg       ANTICOAG SUMMARY: Anticoagulation Episode Summary    Current INR goal 2.0-3.0  Next INR check 05/23/2015  INR from last check 3.2! (05/02/2015)  Weekly max dose   Target end date Indefinite  [facility]R check location [facility]  Preferred lab   Send INR reminders to    Indications  ATRIAL FIBRILLATION PAROXYSMAL CHRONIC (Resolved) [I48.91] Acute ischemic VBA thalamic stroke ([facility]) [id number] I63.22] Chronic anticoagulation on coumadin [Z79.01]        Comments         ANTICOAG TODAY: Anticoagulation Summary as of 05/02/2015    INR goal 2.0-3.0  Selected INR 3.2! (05/02/2015)  Next INR check 05/23/2015  Target end date Indefinite   Indications  ATRIAL FIBRILLATION PAROXYSMAL CHRONIC (Resolved) [I48.91] Acute ischemic VBA thalamic stroke ([facility]) [id number] I63.22] Chronic anticoagulation on coumadin [Z79.01]      Anticoagulation Episode Summary    [facility]R check location [facility]   Preferred lab    Send INR reminders to    Comments       PATIENT INSTRUCTIONS: Patient Instructions  Patient instructed to take medications as defined in the Anti-coagulation Track section of this encounter.  Patient instructed to take today's dose.  Patient verbalized understanding of these instructions.        FOLLOW-UP Return in 3 weeks (on 05/23/2015) for Follow up INR for 2:30 PM.  Joya San, PharmD PGY1 Pharmacy Resident

## 2015-05-02 NOTE — Patient Instructions (Signed)
Patient instructed to take medications as defined in the Anti-coagulation Track section of this encounter.  Patient instructed to take today's dose.  Patient verbalized understanding of these instructions.    

## 2015-05-06 NOTE — Progress Notes (Signed)
INTERNAL MEDICINE TEACHING ATTENDING ADDENDUM - Aaria Happ M.D  Duration- indefinite, Indication- afib, INR- supratherapeutic. Agree with pharmacy recommendations as outlined in their note.     

## 2015-05-30 ENCOUNTER — Ambulatory Visit (INDEPENDENT_AMBULATORY_CARE_PROVIDER_SITE_OTHER): Payer: Medicare Other | Admitting: Pharmacist

## 2015-05-30 DIAGNOSIS — Z8673 Personal history of transient ischemic attack (TIA), and cerebral infarction without residual deficits: Secondary | ICD-10-CM

## 2015-05-30 DIAGNOSIS — Z7901 Long term (current) use of anticoagulants: Secondary | ICD-10-CM

## 2015-05-30 DIAGNOSIS — I6381 Other cerebral infarction due to occlusion or stenosis of small artery: Secondary | ICD-10-CM

## 2015-05-30 DIAGNOSIS — I63219 Cerebral infarction due to unspecified occlusion or stenosis of unspecified vertebral arteries: Secondary | ICD-10-CM

## 2015-05-30 DIAGNOSIS — I6322 Cerebral infarction due to unspecified occlusion or stenosis of basilar arteries: Secondary | ICD-10-CM

## 2015-05-30 LAB — POCT INR: INR: 2.5

## 2015-05-30 NOTE — Patient Instructions (Signed)
Patient instructed to take medications as defined in the Anti-coagulation Track section of this encounter.  Patient instructed to take today's dose.  Patient verbalized understanding of these instructions.    

## 2015-05-30 NOTE — Progress Notes (Signed)
Anti-Coagulation Progress Note  Patrick [name] is a 69 y.o. male who is currently on an anti-coagulation regimen.    RECENT RESULTS: Recent results are below, the most recent result is correlated with a dose of 32.5 mg. per week: Lab Results  Component Value Date   INR 2.50 05/30/2015   INR 3.2 05/02/2015   INR 1.4 04/11/2015    ANTI-COAG DOSE: Anticoagulation Dose Instructions as of 05/30/2015      [name] Tue Wed Thu Fri Sat   New Dose 5 mg 2.5 mg 5 mg 5 mg 5 mg 5 mg 5 mg       ANTICOAG SUMMARY: Anticoagulation Episode Summary    Current INR goal 2.0-3.0  Next INR check 06/27/2015  INR from last check 2.50 (05/30/2015)  Weekly max dose   Target end date Indefinite  [facility]  Preferred lab   Send INR reminders to    Indications  ATRIAL FIBRILLATION PAROXYSMAL CHRONIC (Resolved) [I48.91] Acute ischemic VBA thalamic stroke ([place]) [id number] I63.22] Chronic anticoagulation on coumadin [Z79.01]        Comments         ANTICOAG TODAY: Anticoagulation Summary as of 05/30/2015    INR goal 2.0-3.0  Selected INR 2.50 (05/30/2015)  Next INR check 06/27/2015  Target end date Indefinite   Indications  ATRIAL FIBRILLATION PAROXYSMAL CHRONIC (Resolved) [I48.91] Acute ischemic VBA thalamic stroke ([facility]) [id number] I63.22] Chronic anticoagulation on coumadin [Z79.01]      Anticoagulation Episode Summary    [facility]   Preferred lab    Send INR reminders to    Comments       PATIENT INSTRUCTIONS: Patient Instructions  Patient instructed to take medications as defined in the Anti-coagulation Track section of this encounter.  Patient instructed to take today's dose.  Patient verbalized understanding of these instructions.       FOLLOW-UP Return in 4 weeks (on 06/27/2015) for Follow up INR at [place], III Pharm.D., CACP

## 2015-05-30 NOTE — Progress Notes (Signed)
Indication: Permanent atrial fibrillation. Duration: Lifelong. INR: At target. Agree with Dr. Gladstone Pih assessment and plan.

## 2015-06-04 ENCOUNTER — Other Ambulatory Visit: Payer: Self-pay | Admitting: Pulmonary Disease

## 2015-06-17 ENCOUNTER — Other Ambulatory Visit: Payer: Self-pay | Admitting: Pulmonary Disease

## 2015-06-27 ENCOUNTER — Ambulatory Visit (INDEPENDENT_AMBULATORY_CARE_PROVIDER_SITE_OTHER): Payer: Medicare Other | Admitting: Pharmacist

## 2015-06-27 DIAGNOSIS — I6381 Other cerebral infarction due to occlusion or stenosis of small artery: Secondary | ICD-10-CM

## 2015-06-27 DIAGNOSIS — I63219 Cerebral infarction due to unspecified occlusion or stenosis of unspecified vertebral arteries: Secondary | ICD-10-CM

## 2015-06-27 DIAGNOSIS — I6322 Cerebral infarction due to unspecified occlusion or stenosis of basilar arteries: Secondary | ICD-10-CM | POA: Diagnosis not present

## 2015-06-27 DIAGNOSIS — Z7901 Long term (current) use of anticoagulants: Secondary | ICD-10-CM

## 2015-06-27 LAB — POCT INR: INR: 2.2

## 2015-06-28 NOTE — Patient Instructions (Signed)
Patient instructed to take medications as defined in the Anti-coagulation Track section of this encounter.  Patient instructed to take today's dose.  Patient verbalized understanding of these instructions.    

## 2015-06-28 NOTE — Progress Notes (Signed)
Anti-Coagulation Progress Note  Patrick [name] is a 69 y.o. male who is currently on an anti-coagulation regimen.    RECENT RESULTS: Recent results are below, the most recent result is correlated with a dose of 32.5 mg. per week: Lab Results  Component Value Date   INR 2.20 06/27/2015   INR 2.50 05/30/2015   INR 3.2 05/02/2015    ANTI-COAG DOSE: Anticoagulation Dose Instructions as of 06/27/2015      [name] Tue Wed Thu Fri Sat   New Dose 5 mg 2.5 mg 5 mg 5 mg 5 mg 5 mg 5 mg       ANTICOAG SUMMARY: Anticoagulation Episode Summary    Current INR goal 2.0-3.0  Next INR check 07/28/2015  INR from last check 2.20 (06/27/2015)  Weekly max dose   Target end date Indefinite  [facility]  Preferred lab   Send INR reminders to    Indications  ATRIAL FIBRILLATION PAROXYSMAL CHRONIC (Resolved) [I48.91] Acute ischemic VBA thalamic stroke ([place]) [id number] I63.22] Chronic anticoagulation on coumadin [Z79.01]        Comments         ANTICOAG TODAY: Anticoagulation Summary as of 06/27/2015    INR goal 2.0-3.0  Selected INR 2.20 (06/27/2015)  Next INR check 07/28/2015  Target end date Indefinite   Indications  ATRIAL FIBRILLATION PAROXYSMAL CHRONIC (Resolved) [I48.91] Acute ischemic VBA thalamic stroke ([facility]) [id number] I63.22] Chronic anticoagulation on coumadin [Z79.01]      Anticoagulation Episode Summary    [facility]   Preferred lab    Send INR reminders to    Comments       PATIENT INSTRUCTIONS: Patient Instructions  Patient instructed to take medications as defined in the Anti-coagulation Track section of this encounter.  Patient instructed to take today's dose.  Patient verbalized understanding of these instructions.       FOLLOW-UP Return in 4 weeks (on 07/28/2015) for Follow up INR at 2:15PM.  [name], III Pharm.D., CACP

## 2015-07-04 ENCOUNTER — Other Ambulatory Visit: Payer: Self-pay | Admitting: Internal Medicine

## 2015-07-08 ENCOUNTER — Other Ambulatory Visit: Payer: Self-pay | Admitting: Pulmonary Disease

## 2015-07-10 ENCOUNTER — Other Ambulatory Visit: Payer: Self-pay | Admitting: Internal Medicine

## 2015-07-14 NOTE — Progress Notes (Signed)
I have reviewed Dr. Groce's note.  Patient is on AC for Afib, INR at goal.  

## 2015-07-21 ENCOUNTER — Other Ambulatory Visit: Payer: Self-pay

## 2015-07-21 DIAGNOSIS — I10 Essential (primary) hypertension: Secondary | ICD-10-CM

## 2015-07-21 MED ORDER — CLONIDINE HCL 0.3 MG PO TABS: 0.3000 mg | ORAL_TABLET | Freq: Three times a day (TID) | ORAL | Status: DC

## 2015-07-28 ENCOUNTER — Other Ambulatory Visit: Payer: Self-pay | Admitting: Pulmonary Disease

## 2015-07-28 ENCOUNTER — Ambulatory Visit (INDEPENDENT_AMBULATORY_CARE_PROVIDER_SITE_OTHER): Payer: Medicare Other | Admitting: Pharmacist

## 2015-07-28 DIAGNOSIS — Z7901 Long term (current) use of anticoagulants: Secondary | ICD-10-CM | POA: Diagnosis present

## 2015-07-28 DIAGNOSIS — Z8673 Personal history of transient ischemic attack (TIA), and cerebral infarction without residual deficits: Secondary | ICD-10-CM | POA: Diagnosis not present

## 2015-07-28 DIAGNOSIS — I6381 Other cerebral infarction due to occlusion or stenosis of small artery: Secondary | ICD-10-CM

## 2015-07-28 DIAGNOSIS — I63219 Cerebral infarction due to unspecified occlusion or stenosis of unspecified vertebral arteries: Secondary | ICD-10-CM

## 2015-07-28 DIAGNOSIS — I6322 Cerebral infarction due to unspecified occlusion or stenosis of basilar arteries: Secondary | ICD-10-CM

## 2015-07-28 LAB — POCT INR: INR: 2.1

## 2015-07-28 NOTE — Patient Instructions (Signed)
Patient instructed to take medications as defined in the Anti-coagulation Track section of this encounter.  Patient instructed to take today's dose.  Patient verbalized understanding of these instructions.    

## 2015-07-28 NOTE — Progress Notes (Signed)
Anti-Coagulation Progress Note  Patrick [name] is a 69 y.o. male who is currently on an anti-coagulation regimen.    RECENT RESULTS: Recent results are below, the most recent result is correlated with a dose of 32.5 mg. per week: Lab Results  Component Value Date   INR 2.10 07/28/2015   INR 2.20 06/27/2015   INR 2.50 05/30/2015    ANTI-COAG DOSE: Anticoagulation Dose Instructions as of 07/28/2015      [name] Tue Wed Thu Fri Sat   New Dose 5 mg 5 mg 5 mg 5 mg 5 mg 5 mg 5 mg    Description        Take one (1) tablet of your 5mg  strength warfarin by mouth daily.        ANTICOAG SUMMARY: Anticoagulation Episode Summary    Current INR goal 2.0-3.0  Next INR check 08/29/2015  INR from last check 2.10 (07/28/2015)  Weekly max dose   Target end date Indefinite  [facility]  Preferred lab   Send INR reminders to    Indications  ATRIAL FIBRILLATION PAROXYSMAL CHRONIC (Resolved) [I48.91] Acute ischemic VBA thalamic stroke ([place]) [id number] I63.22] Chronic anticoagulation on coumadin [Z79.01]        Comments         ANTICOAG TODAY: Anticoagulation Summary as of 07/28/2015    INR goal 2.0-3.0  Selected INR 2.10 (07/28/2015)  Next INR check 08/29/2015  Target end date Indefinite   Indications  ATRIAL FIBRILLATION PAROXYSMAL CHRONIC (Resolved) [I48.91] Acute ischemic VBA thalamic stroke ([facility]) [id number] I63.22] Chronic anticoagulation on coumadin [Z79.01]      Anticoagulation Episode Summary    [facility]   Preferred lab    Send INR reminders to    Comments       PATIENT INSTRUCTIONS: Patient Instructions  Patient instructed to take medications as defined in the Anti-coagulation Track section of this encounter.  Patient instructed to take today's dose.  Patient verbalized understanding of these instructions.       FOLLOW-UP Return in 5 weeks (on 08/29/2015) for Follow up INR at 0930h.  [name],  III Pharm.D., CACP

## 2015-07-28 NOTE — Progress Notes (Signed)
Indication: Permanent atrial fibrillation.  Duration: Lifelong.  INR at target.  Agree with Dr. Gladstone Pih assessment and plan as documented.

## 2015-08-29 ENCOUNTER — Ambulatory Visit (INDEPENDENT_AMBULATORY_CARE_PROVIDER_SITE_OTHER): Payer: Medicare Other | Admitting: Pharmacist

## 2015-08-29 ENCOUNTER — Ambulatory Visit (INDEPENDENT_AMBULATORY_CARE_PROVIDER_SITE_OTHER): Payer: Medicare Other | Admitting: Pulmonary Disease

## 2015-08-29 ENCOUNTER — Encounter: Payer: Self-pay | Admitting: Pulmonary Disease

## 2015-08-29 ENCOUNTER — Other Ambulatory Visit: Payer: Self-pay | Admitting: Internal Medicine

## 2015-08-29 VITALS — BP 221/132 | HR 59 | Temp 97.5°F | Resp 18 | Ht 68.5 in | Wt 187.7 lb

## 2015-08-29 DIAGNOSIS — I6322 Cerebral infarction due to unspecified occlusion or stenosis of basilar arteries: Secondary | ICD-10-CM

## 2015-08-29 DIAGNOSIS — Z23 Encounter for immunization: Secondary | ICD-10-CM

## 2015-08-29 DIAGNOSIS — R319 Hematuria, unspecified: Secondary | ICD-10-CM

## 2015-08-29 DIAGNOSIS — E113419 Type 2 diabetes mellitus with severe nonproliferative diabetic retinopathy with macular edema, unspecified eye: Secondary | ICD-10-CM

## 2015-08-29 DIAGNOSIS — I63219 Cerebral infarction due to unspecified occlusion or stenosis of unspecified vertebral arteries: Secondary | ICD-10-CM

## 2015-08-29 DIAGNOSIS — Z7901 Long term (current) use of anticoagulants: Secondary | ICD-10-CM | POA: Diagnosis present

## 2015-08-29 DIAGNOSIS — I10 Essential (primary) hypertension: Secondary | ICD-10-CM | POA: Diagnosis not present

## 2015-08-29 DIAGNOSIS — Z8673 Personal history of transient ischemic attack (TIA), and cerebral infarction without residual deficits: Secondary | ICD-10-CM | POA: Diagnosis not present

## 2015-08-29 DIAGNOSIS — I6381 Other cerebral infarction due to occlusion or stenosis of small artery: Secondary | ICD-10-CM

## 2015-08-29 DIAGNOSIS — E785 Hyperlipidemia, unspecified: Secondary | ICD-10-CM | POA: Diagnosis not present

## 2015-08-29 LAB — POCT GLYCOSYLATED HEMOGLOBIN (HGB A1C): Hemoglobin A1C: 7.7

## 2015-08-29 LAB — POCT URINALYSIS DIPSTICK
BILIRUBIN UA: NEGATIVE
Glucose, UA: NEGATIVE
KETONES UA: NEGATIVE
Leukocytes, UA: NEGATIVE
Nitrite, UA: NEGATIVE
PH UA: 6.5
Protein, UA: 100
RBC UA: NEGATIVE
SPEC GRAV UA: 1.02
Urobilinogen, UA: 0.2

## 2015-08-29 LAB — GLUCOSE, CAPILLARY: GLUCOSE-CAPILLARY: 147 mg/dL — AB (ref 65–99)

## 2015-08-29 LAB — POCT INR: INR: 1.8

## 2015-08-29 MED ORDER — PRAVASTATIN SODIUM 80 MG PO TABS: 80.0000 mg | ORAL_TABLET | Freq: Every day | ORAL | Status: DC

## 2015-08-29 MED ORDER — CLONIDINE HCL 0.3 MG/24HR TD PTWK: 0.3000 mg | MEDICATED_PATCH | TRANSDERMAL | Status: DC

## 2015-08-29 MED ORDER — DILTIAZEM HCL ER 60 MG PO CP12: 60.0000 mg | ORAL_CAPSULE | Freq: Two times a day (BID) | ORAL | Status: DC

## 2015-08-29 MED ORDER — METFORMIN HCL ER 500 MG PO TB24: 1000.0000 mg | ORAL_TABLET | Freq: Every day | ORAL | Status: DC

## 2015-08-29 MED ORDER — WARFARIN SODIUM 5 MG PO TABS: 5.0000 mg | ORAL_TABLET | Freq: Every day | ORAL | Status: DC

## 2015-08-29 MED ORDER — LISINOPRIL 10 MG PO TABS: 10.0000 mg | ORAL_TABLET | Freq: Every day | ORAL | Status: DC

## 2015-08-29 NOTE — Progress Notes (Signed)
Subjective:    Patient ID: Patrick [name], male    DOB: 09-Sep-1945, 70 y.o.   MRN: [id number]  HPI [name] is a 70 year old man with history of HTN, CKD stage 3, CAD, depression, gout, HLD, CVA, GERD, DM2, AF on coumadin, OSA presenting for follow up of hypertension.  His wife reports he has been having dietary indiscretion with eating fatback. He also is only taking his medications once a day. Denies headaches or lightheadedness. No slurred speech. He does not check his blood pressure at home.   He had hematuria and dysuria on Thursday and Friday. He decreased his coumadin dose and his symptoms have resolved. He is a previous tobacco user who quit in 1970s.  Review of Systems Constitutional: no fevers/chills Eyes: no vision changes Respiratory: no shortness of breath Cardiovascular: no chest pain, no palpitations Gastrointestinal: no nausea/vomiting, no abdominal pain, no diarrhea Hematologic/lymphatic: +chronic edema Neurological: no paresthesias, no weakness  Past Medical History  Diagnosis Date  . Hypertension   . CKD (chronic kidney disease), stage II   . CAD (coronary artery disease)   . Depression   . Gout   . Hyperlipidemia   . 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 GERD (gastroesophageal reflux disease)   . Diabetes mellitus type II     Insulin dependent  . Upper GI bleeding 07/01/2013  . Paroxysmal atrial fibrillation ([facility])     on coumadin  . Atrial fibrillation ([place])   . Myocardial infarction [facility]) 2000's    "near heart attack" (07/14/2013)  . Shortness of breath     "can happen at any time" (07/14/2013)  . Sleep apnea 07/2010    "has mask at home; seldom uses it" (07/14/2013)  . Stomach ulcer 1950's    "as a teenager"  . Stroke [facility]) ~ 2007; ~ 2009    "memory not as good since" (07/14/2013)  . Permanent atrial fibrillation ([place]) 07/17/2013  . Chronic anticoagulation, on coumadin 07/17/2013  .  Tachy-brady syndrome ([place]) 07/17/2013    Current Outpatient Prescriptions on File Prior to Visit  Medication Sig Dispense Refill  . allopurinol (ZYLOPRIM) 100 MG tablet TAKE TWO TABLETS BY MOUTH ONCE DAILY 60 tablet 3  . aspirin 81 MG EC tablet Take 81 mg by mouth daily.      . Blood Glucose Monitoring Suppl (ACCU-CHEK AVIVA PLUS) W/DEVICE KIT 1 each by Does not apply route as needed. 1 kit 0  . cloNIDine (CATAPRES) 0.3 MG tablet Take 1 tablet (0.3 mg total) by mouth 3 (three) times daily. 180 tablet 2  . diltiazem (CARDIZEM) 30 MG tablet TAKE ONE TABLET BY MOUTH THREE TIMES DAILY 180 tablet 0  . furosemide (LASIX) 20 MG tablet TAKE ONE TABLET BY MOUTH ONCE DAILY 30 tablet 5  . glucose blood (TRUETRACK TEST) test strip Use to test blood sugar 2-3 times daily dx code 250.00 100 each 11  . hydrALAZINE (APRESOLINE) 100 MG tablet TAKE ONE TABLET BY MOUTH EVERY 8 HOURS 90 tablet 3  . Lancets 30G MISC Use to test blood glucose 2-3 times daily dx code 250.00 100 each 11  . lisinopril (PRINIVIL,ZESTRIL) 10 MG tablet Take 1 tablet (10 mg total) by mouth daily. 90 tablet 3  . metFORMIN (GLUCOPHAGE) 500 MG tablet TAKE ONE TABLET BY MOUTH ONCE DAILY WITH  BREAKFAST 60 tablet 0  . metFORMIN (GLUCOPHAGE) 500 MG tablet TAKE ONE TABLET BY MOUTH ONCE DAILY WITH  BREAKFAST 60  tablet 3  . pravastatin (PRAVACHOL) 80 MG tablet Take 1 tablet (80 mg total) by mouth daily. 90 tablet 3  . warfarin (COUMADIN) 5 MG tablet TAKE ONE TABLET BY MOUTH ON ALL DAYS EXCEPT ON MONDAYS, TAKE ONE-HALF TABLET. 30 tablet 1   No current facility-administered medications on file prior to visit.    Today's Vitals   08/29/15 1440 08/29/15 1450  BP: 220/115 221/132  Pulse: 59   Temp: 97.5 F (36.4 C)   TempSrc: Oral   Resp: 18   Height: 5' 8.5" (1.74 m)   Weight: 187 lb 11.2 oz (85.14 kg)   SpO2: 100%    Objective:   Physical Exam  Constitutional: He appears well-developed and well-nourished. No distress.  HENT:  Head:  Normocephalic and atraumatic.  Eyes: No scleral icterus.  Cardiovascular: Normal rate, regular rhythm and normal heart sounds.   Pulmonary/Chest: Effort normal and breath sounds normal. He has no wheezes. He has no rales.  Musculoskeletal: He exhibits edema (1+ BLE). He exhibits no tenderness.  Neurological: Coordination normal.   Assessment & Plan:  Please refer to problem based charting.

## 2015-08-29 NOTE — Progress Notes (Signed)
Anti-Coagulation Progress Note  Patrick [name] is a 70 y.o. male who is currently on an anti-coagulation regimen.    RECENT RESULTS: Recent results are below, the most recent result is correlated with a dose of 35 mg. per week: Lab Results  Component Value Date   INR 1.80 08/29/2015   INR 2.10 07/28/2015   INR 2.20 06/27/2015    ANTI-COAG DOSE: Anticoagulation Dose Instructions as of 08/29/2015      [name] Tue Wed Thu Fri Sat   New Dose 5 mg 5 mg 5 mg 5 mg 5 mg 5 mg 5 mg    Description        Take one (1) tablet of your 5mg  strength warfarin by mouth daily.        ANTICOAG SUMMARY: Anticoagulation Episode Summary    Current INR goal 2.0-3.0  Next INR check 09/26/2015  INR from last check 1.80! (08/29/2015)  Weekly max dose   Target end date Indefinite  [facility]  Preferred lab   Send INR reminders to    Indications  ATRIAL FIBRILLATION PAROXYSMAL CHRONIC (Resolved) [I48.91] Acute ischemic VBA thalamic stroke ([place]) [id number] I63.22] Chronic anticoagulation on coumadin [Z79.01]        Comments         ANTICOAG TODAY: Anticoagulation Summary as of 08/29/2015    INR goal 2.0-3.0  Selected INR 1.80! (08/29/2015)  Next INR check 09/26/2015  Target end date Indefinite   Indications  ATRIAL FIBRILLATION PAROXYSMAL CHRONIC (Resolved) [I48.91] Acute ischemic VBA thalamic stroke ([facility]) [id number] I63.22] Chronic anticoagulation on coumadin [Z79.01]      Anticoagulation Episode Summary    [facility]   Preferred lab    Send INR reminders to    Comments       PATIENT INSTRUCTIONS: Patient Instructions  Patient instructed to take medications as defined in the Anti-coagulation Track section of this encounter.  Patient instructed to take today's dose.  Patient was instructed that upon seeing his physician today--should he be commenced upon any new medications--to please come back by the anticoagulation clinic room or  have physician alert me to any new medication.  Patient verbalized understanding of these instructions.       FOLLOW-UP Return in 4 weeks (on 09/26/2015) for Follow up INR at [place], III Pharm.D., CACP

## 2015-08-29 NOTE — Patient Instructions (Signed)
Patient instructed to take medications as defined in the Anti-coagulation Track section of this encounter.  Patient instructed to take today's dose.  Patient was instructed that upon seeing his physician today--should he be commenced upon any new medications--to please come back by the anticoagulation clinic room or have physician alert me to any new medication.  Patient verbalized understanding of these instructions.

## 2015-08-29 NOTE — Progress Notes (Signed)
Indication: Permanent atrial fibrillation Duration: Lifelong INR: Below target.  Dr. Gladstone Pih assessment and plan were reviewed and I agree with his documentation.

## 2015-08-30 DIAGNOSIS — R319 Hematuria, unspecified: Secondary | ICD-10-CM | POA: Insufficient documentation

## 2015-08-30 NOTE — Assessment & Plan Note (Signed)
Assessment: Doing well on pravastatin 80mg  daily  Plan: -Refill ordered for pravastatin 80mg  daily

## 2015-08-30 NOTE — Assessment & Plan Note (Signed)
BP Readings from Last 3 Encounters:  08/29/15 221/132  10/18/14 156/87  05/10/14 168/94    Lab Results  Component Value Date   NA 142 10/18/2014   K 4.2 10/18/2014   CREATININE 2.24* 10/18/2014    Assessment: Blood pressure control: severely elevated Progress toward BP goal:  deteriorated Comments: Noncompliant with medications and dietary indiscretions  Plan: Medications:  Changed clonidine 0.3mg  TID to 0.3mg  patch. Changed diltiazem from 30mg  TID to 60mg  12 hr capsule BID. Continue Lasix 20mg  daily. Restart lisinopril 10mg  daily. Discontinue hydralazine.  Other plans:  -Consider changing Lasix to HCTZ at follow up visit for better BP control. -Follow up in 3-4 weeks. -Emphasized cutting out fatback from diet. -Check BMP at follow up visit.

## 2015-08-30 NOTE — Assessment & Plan Note (Addendum)
Assessment: Hematuria and dysuria that have resolved now. Urine dip with neg leuk, nitrite, RBC. Former smoker.  Plan: -Continue to monitor -Would obtain CT abd/pelvis without contrast and referral to urology for eval. Will discuss with patient at next visit as it looks like he has had this ordered for him before and he did not get it done.

## 2015-08-30 NOTE — Assessment & Plan Note (Signed)
Lab Results  Component Value Date   HGBA1C 7.7 08/29/2015   HGBA1C 6.2 10/18/2014   HGBA1C 9.3 03/29/2014     Assessment: Diabetes control: fair control Progress toward A1C goal:  deteriorated Comments: Dietary indiscretions  Plan: Medications:  Increase metformin to 1000mg  XR tablet. Other plans:  -Recheck A1c in 3 months.

## 2015-08-31 ENCOUNTER — Telehealth: Payer: Self-pay | Admitting: Pulmonary Disease

## 2015-08-31 NOTE — Telephone Encounter (Signed)
Dr. Randell Patient- pt wife [name] called to clarify medication regimen after his visit on the 30th.  They cannot afford the clonidine patch so pt says he will return to taking the po version (he admits he wasn't taking before), they would like to change the pravastatin to something cheaper.  Finally, they say that hydralazine is no longer listed on his active list and want to be sure this is correct because [facility] is stating they have a new rx for this medicine.

## 2015-08-31 NOTE — Progress Notes (Signed)
Internal Medicine Clinic Attending  Case discussed with Dr. Krall at the time of the visit.  We reviewed the resident's history and exam and pertinent patient test results.  I agree with the assessment, diagnosis, and plan of care documented in the resident's note.  

## 2015-08-31 NOTE — Telephone Encounter (Signed)
Spoke with [facility], they haven't rec'd a new hydralazine rx since august so pt likely just has a refill.  I have told them not to fill under the old rx.  If you intend for hydralzaine to be a current med please send new rx.

## 2015-08-31 NOTE — Telephone Encounter (Signed)
Pt have question about his medicines. Please call pt back.

## 2015-09-01 NOTE — Telephone Encounter (Signed)
Will ask for Dr. Julianne Rice help with this.  Dr. Maudie Mercury, I am trying to simplify his hypertension medication regimen because he likes to take all of his medications once daily. He was agreeable to some being twice a day. Also will need help with figuring out an alternative to pravastatin. Ideally would be on high intensity statin.  Thanks, Jacques Earthly, MD  Internal Medicine PGY-2

## 2015-09-09 NOTE — Telephone Encounter (Signed)
Dr. Maudie Mercury, this fell off my radar.  Did you ever see this and make any updates for pt?

## 2015-09-15 ENCOUNTER — Telehealth: Payer: Self-pay | Admitting: Pharmacist

## 2015-09-15 ENCOUNTER — Other Ambulatory Visit: Payer: Self-pay | Admitting: Pulmonary Disease

## 2015-09-15 NOTE — Telephone Encounter (Signed)
Hi Dr. Randell Patient, sorry for the delay. Between calling the patient (spoke to wife) and pharmacy, these are my findings and suggestions.  Patient is refusing the clonidine due to cost and hydralazine due to more than once daily dosing. Per [facility], patient is only consistent with filling lisinopril out of the HTN meds. Diltiazem was filled in May 2016 for 60-day supply and Nov 2016 for 60-day supply. Furosemide was filled in Aug, Sep, Nov, Dec 2016 each for 30-day supply.  Maybe for next patient appointment we can increase furosemide to 40 mg daily and/or switch diltiazem to the 24-hour formulation to help with adherence. We also have room to increase diltiazem dose if tolerated. I would worry about renal function to increase lisinopril dosing. Otherwise, not a lot of options due to renal function but maybe a non-B1 selective beta blocker if patient would tolerate (e.g. Carvedilol or labetalol). Please let me know if I can help.

## 2015-09-15 NOTE — Telephone Encounter (Signed)
Spoke to patient's wife about current regimen and she states that they are refusing the clonidine patch at this time due to cost and prefer once-daily dosing on medications if possible. Also requesting help with cost overall. Will collaborate with PCP on HTN regimen.

## 2015-09-16 MED ORDER — DILTIAZEM HCL ER 120 MG PO CP24: 120.0000 mg | ORAL_CAPSULE | Freq: Every day | ORAL | Status: DC

## 2015-09-16 NOTE — Telephone Encounter (Signed)
I changed his diltiazem to a once daily formulation.  Could you please let the patient know? Thank you.  Jacques Earthly, MD  Internal Medicine Teaching Service PGY-2

## 2015-09-26 ENCOUNTER — Encounter: Payer: Self-pay | Admitting: Internal Medicine

## 2015-09-26 ENCOUNTER — Ambulatory Visit (INDEPENDENT_AMBULATORY_CARE_PROVIDER_SITE_OTHER): Payer: Medicare Other | Admitting: Pharmacist

## 2015-09-26 ENCOUNTER — Ambulatory Visit (INDEPENDENT_AMBULATORY_CARE_PROVIDER_SITE_OTHER): Payer: Medicare Other | Admitting: Internal Medicine

## 2015-09-26 VITALS — BP 160/102 | HR 79 | Temp 97.4°F | Ht 68.5 in | Wt 184.2 lb

## 2015-09-26 DIAGNOSIS — Z7984 Long term (current) use of oral hypoglycemic drugs: Secondary | ICD-10-CM | POA: Diagnosis not present

## 2015-09-26 DIAGNOSIS — I63219 Cerebral infarction due to unspecified occlusion or stenosis of unspecified vertebral arteries: Secondary | ICD-10-CM

## 2015-09-26 DIAGNOSIS — E113419 Type 2 diabetes mellitus with severe nonproliferative diabetic retinopathy with macular edema, unspecified eye: Secondary | ICD-10-CM

## 2015-09-26 DIAGNOSIS — Z7901 Long term (current) use of anticoagulants: Secondary | ICD-10-CM

## 2015-09-26 DIAGNOSIS — I6322 Cerebral infarction due to unspecified occlusion or stenosis of basilar arteries: Secondary | ICD-10-CM

## 2015-09-26 DIAGNOSIS — N183 Chronic kidney disease, stage 3 unspecified: Secondary | ICD-10-CM

## 2015-09-26 DIAGNOSIS — I6381 Other cerebral infarction due to occlusion or stenosis of small artery: Secondary | ICD-10-CM

## 2015-09-26 DIAGNOSIS — Z8673 Personal history of transient ischemic attack (TIA), and cerebral infarction without residual deficits: Secondary | ICD-10-CM

## 2015-09-26 DIAGNOSIS — I10 Essential (primary) hypertension: Secondary | ICD-10-CM

## 2015-09-26 DIAGNOSIS — Z7982 Long term (current) use of aspirin: Secondary | ICD-10-CM | POA: Diagnosis not present

## 2015-09-26 DIAGNOSIS — E1122 Type 2 diabetes mellitus with diabetic chronic kidney disease: Secondary | ICD-10-CM

## 2015-09-26 DIAGNOSIS — I129 Hypertensive chronic kidney disease with stage 1 through stage 4 chronic kidney disease, or unspecified chronic kidney disease: Secondary | ICD-10-CM | POA: Diagnosis not present

## 2015-09-26 LAB — POCT INR: INR: 3

## 2015-09-26 LAB — GLUCOSE, CAPILLARY: GLUCOSE-CAPILLARY: 146 mg/dL — AB (ref 65–99)

## 2015-09-26 MED ORDER — CLONIDINE HCL 0.3 MG/24HR TD PTWK: 0.3000 mg | MEDICATED_PATCH | TRANSDERMAL | Status: DC

## 2015-09-26 MED ORDER — DILTIAZEM HCL ER 120 MG PO CP24: 120.0000 mg | ORAL_CAPSULE | Freq: Every day | ORAL | Status: DC

## 2015-09-26 MED ORDER — FUROSEMIDE 20 MG PO TABS: 20.0000 mg | ORAL_TABLET | Freq: Two times a day (BID) | ORAL | Status: DC

## 2015-09-26 NOTE — Patient Instructions (Signed)
1. Please make a follow up for 2 weeks.   2. Please take all medications as previously prescribed with the following changes:  PLEASE PICK UP THESE PRESCRIPTIONS FROM YOUR PHARMACY:   -Clonidine PATCH (for blood pressure). This is to replaced your clonidine pill. DO NOT USE BOTH THE PATCH AND THE PILL.  -Diltiazem 120 mg daily (for blood pressure). THIS IS A NEW PILL THAT REPLACES THE DILTIAZEM THAT YOU TAKE THREE TIMES DAILY. DO NOT TAKE BOTH.   -Lasix (water pill, for blood pressure) 20 mg TWICE DAILY. THIS IS INCREASED FROM ONCE DAILY.   -Please THROW AWAY ANY HYDRALAZINE YOU MIGHT HAVE. DO NOT TAKE THIS ANY LONGER.   3. If you have worsening of your symptoms or new symptoms arise, please call the clinic [phone]), or go to the ER immediately if symptoms are severe.

## 2015-09-26 NOTE — Progress Notes (Signed)
Anti-Coagulation Progress Note  Patrick [name] is a 70 y.o. male who is currently on an anti-coagulation regimen.    RECENT RESULTS: Recent results are below, the most recent result is correlated with a dose of 35 mg. per week: Lab Results  Component Value Date   INR 3.0 09/26/2015   INR 1.80 08/29/2015   INR 2.10 07/28/2015    ANTI-COAG DOSE: Anticoagulation Dose Instructions as of 09/26/2015      [name] Tue Wed Thu Fri Sat   New Dose 5 mg 2.5 mg 5 mg 5 mg 5 mg 5 mg 5 mg       ANTICOAG SUMMARY: Anticoagulation Episode Summary    Current INR goal 2.0-3.0  Next INR check 10/17/2015  INR from last check 3.0 (09/26/2015)  Weekly max dose   Target end date Indefinite  [facility]  Preferred lab   Send INR reminders to    Indications  ATRIAL FIBRILLATION PAROXYSMAL CHRONIC (Resolved) [I48.91] Acute ischemic VBA thalamic stroke ([place]) [id number] I63.22] Chronic anticoagulation on coumadin [Z79.01]        Comments         ANTICOAG TODAY: Anticoagulation Summary as of 09/26/2015    INR goal 2.0-3.0  Selected INR 3.0 (09/26/2015)  Next INR check 10/17/2015  Target end date Indefinite   Indications  ATRIAL FIBRILLATION PAROXYSMAL CHRONIC (Resolved) [I48.91] Acute ischemic VBA thalamic stroke ([facility]) [id number] I63.22] Chronic anticoagulation on coumadin [Z79.01]      Anticoagulation Episode Summary    [facility]   Preferred lab    Send INR reminders to    Comments       PATIENT INSTRUCTIONS: Patient Instructions  Patient instructed to take medications as defined in the Anti-coagulation Track section of this encounter.  Patient instructed to take today's dose.  Patient verbalized understanding of these instructions.       FOLLOW-UP Return in about 3 weeks (around 10/17/2015) for INR follow up [place], III Pharm.D., CACP

## 2015-09-26 NOTE — Progress Notes (Signed)
Subjective:   Patient ID: Patrick [name] male   DOB: Jan 20, 1946 70 y.o.   MRN: [id number]  HPI: [name] is a 70 y.o. male w/ PMHx of HTN, HLD, CKD stage 2, CAD, depression, Gout, h/o CVA in 2012 with MCI, DM type II, GERD, PAF on Coumadin, and OSA, presents to the clinic today for an acute visit for hid BP. Patient is NOT accompanied by his wife today who helps him with his medications. Patient has elevated blood pressure today and does not understand how he is to take any of his medications, does not understand what they are for, when he is to take them, or why he needs them. He does not seem to comprehend his medical issues or have any understanding of even why he is in clinic today. Today, he brought in the following medications:  -Clonidine 0.3 mg tid (apparently takes this once a day; did not pick up the patch because of cost) -Diltiazem 30 mg tid (NOT long acting as previously prescribed by Dr. Randell Patient; also takes this once a day) -Lasix 20 mg daily -Empty Hydralazine bottle -Lisinopril 10 mg daily  At his last visit, new prescriptions were sent to the patient's pharmacy to simplify his medication regimen, however, he has not picked up any of the medications and is not taking virtually any of the medications he does have correctly. Wife did not accompany patient to his appointment today, however discussed with her over the phone after his visit given his confusion with his medications, and she does not seem to comprehend his likely mild cognitive impairment and inability to understand his BP regimen. She was not able to afford the patch for Clonidine, did not know this until after the visit given the fact that the wife did not come to visit.   Patient also did not go to lab to get BMP drawn during visit.   Past Medical History  Diagnosis Date  . Hypertension   . CKD (chronic kidney disease), stage II   . CAD (coronary artery disease)   . Depression   . Gout   . Hyperlipidemia   .  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 GERD (gastroesophageal reflux disease)   . Diabetes mellitus type II     Insulin dependent  . Upper GI bleeding 07/01/2013  . Paroxysmal atrial fibrillation (HCC)     on coumadin  . Atrial fibrillation ([place])   . Myocardial infarction [facility]) 2000's    "near heart attack" (07/14/2013)  . Shortness of breath     "can happen at any time" (07/14/2013)  . Sleep apnea 07/2010    "has mask at home; seldom uses it" (07/14/2013)  . Stomach ulcer 1950's    "as a teenager"  . Stroke [facility]) ~ 2007; ~ 2009    "memory not as good since" (07/14/2013)  . Permanent atrial fibrillation ([place]) 07/17/2013  . Chronic anticoagulation, on coumadin 07/17/2013  . Tachy-brady syndrome ([place]) 07/17/2013   Current Outpatient Prescriptions  Medication Sig Dispense Refill  . allopurinol (ZYLOPRIM) 100 MG tablet TAKE TWO TABLETS BY MOUTH ONCE DAILY 60 tablet 3  . aspirin 81 MG EC tablet Take 81 mg by mouth daily.      . Blood Glucose Monitoring Suppl (ACCU-CHEK AVIVA PLUS) W/DEVICE KIT 1 each by Does not apply route as needed. 1 kit 0  . cloNIDine (CATAPRES) 0.3 MG tablet Take 1 tablet (0.3 mg total) by mouth  3 (three) times daily. 90 tablet 5  . diltiazem (DILACOR XR) 120 MG 24 hr capsule Take 1 capsule (120 mg total) by mouth daily. 30 capsule 1  . furosemide (LASIX) 20 MG tablet Take 1 tablet (20 mg total) by mouth 2 (two) times daily. 60 tablet 5  . glucose blood (TRUETRACK TEST) test strip Use to test blood sugar 2-3 times daily dx code 250.00 100 each 11  . Lancets 30G MISC Use to test blood glucose 2-3 times daily dx code 250.00 100 each 11  . lisinopril (PRINIVIL,ZESTRIL) 10 MG tablet Take 1 tablet (10 mg total) by mouth daily. 90 tablet 0  . metFORMIN (GLUCOPHAGE-XR) 500 MG 24 hr tablet Take 2 tablets (1,000 mg total) by mouth daily with breakfast. 60 tablet 0  . pravastatin (PRAVACHOL) 80 MG tablet Take 1 tablet (80 mg  total) by mouth daily. 90 tablet 3  . warfarin (COUMADIN) 5 MG tablet Take 1 tablet (5 mg total) by mouth daily at 6 PM. 30 tablet 1   No current facility-administered medications for this visit.    Review of Systems: General: Denies fever, chills, diaphoresis, appetite change and fatigue.  Respiratory: Denies SOB, DOE, cough, and wheezing.   Cardiovascular: Denies chest pain and palpitations.  Gastrointestinal: Denies nausea, vomiting, abdominal pain, and diarrhea.  Genitourinary: Positive for impotence. Denies dysuria, increased frequency, and flank pain. Endocrine: Denies hot or cold intolerance, polyuria, and polydipsia. Musculoskeletal: Denies myalgias, back pain, joint swelling, arthralgias and gait problem.  Skin: Denies pallor, rash and wounds.  Neurological: Denies dizziness, seizures, syncope, weakness, lightheadedness, numbness and headaches.  Psychiatric/Behavioral: Denies mood changes, and sleep disturbances.  Objective:   Physical Exam: Filed Vitals:   09/26/15 1407 09/26/15 1413  BP: 191/104 160/102  Pulse: 76 79  Temp: 97.4 F (36.3 C)   TempSrc: Oral   Height: 5' 8.5" (1.74 m)   Weight: 184 lb 3.2 oz (83.553 kg)   SpO2: 100%     General: Alert, cooperative, NAD. Speaks slowly.  HEENT: PERRL, EOMI. Moist mucus membranes Neck: Full range of motion without pain, supple, no lymphadenopathy or carotid bruits Lungs: Clear to ascultation bilaterally, normal work of respiration, no wheezes, rales, rhonchi Heart: Irregular rhythm, no murmurs, gallops, or rubs Abdomen: Soft, non-tender, non-distended, BS + Extremities: No cyanosis, clubbing. +2 pitting edema extending to the shins.  Neurologic: Alert & oriented x2, cranial nerves II-XII intact, strength grossly intact, sensation intact to light touch   Assessment & Plan:   Please see problem based assessment and plan.

## 2015-09-26 NOTE — Progress Notes (Signed)
INTERNAL MEDICINE TEACHING ATTENDING ADDENDUM - Geetika Laborde M.D  Duration- indefinite, Indication- pAfib, INR- therapeutic. Agree with pharmacy recommendations as outlined in their note.      

## 2015-09-26 NOTE — Patient Instructions (Signed)
Patient instructed to take medications as defined in the Anti-coagulation Track section of this encounter.  Patient instructed to take today's dose.  Patient verbalized understanding of these instructions.    

## 2015-09-27 ENCOUNTER — Telehealth: Payer: Self-pay | Admitting: *Deleted

## 2015-09-27 ENCOUNTER — Other Ambulatory Visit (INDEPENDENT_AMBULATORY_CARE_PROVIDER_SITE_OTHER): Payer: Medicare Other

## 2015-09-27 DIAGNOSIS — I110 Hypertensive heart disease with heart failure: Secondary | ICD-10-CM | POA: Diagnosis present

## 2015-09-27 DIAGNOSIS — N183 Chronic kidney disease, stage 3 (moderate): Secondary | ICD-10-CM

## 2015-09-27 DIAGNOSIS — I10 Essential (primary) hypertension: Secondary | ICD-10-CM | POA: Diagnosis not present

## 2015-09-27 MED ORDER — CLONIDINE HCL 0.3 MG PO TABS: 0.3000 mg | ORAL_TABLET | Freq: Three times a day (TID) | ORAL | Status: DC

## 2015-09-27 MED ORDER — DILTIAZEM HCL ER 120 MG PO CP24: 120.0000 mg | ORAL_CAPSULE | Freq: Every day | ORAL | Status: DC

## 2015-09-27 NOTE — Assessment & Plan Note (Signed)
Most recent Cr 2.24 in 09/2014. Intended on repeating given the use of ACEI and Metformin 1000 mg daily with GFR of 34, however, patient left without obtaining lab.  -Will call patient and have him return to check Cr. If unable to perform, would make sure this happens at next clinic visit.

## 2015-09-27 NOTE — Assessment & Plan Note (Signed)
Unable to discuss diabetes control today given extensive discussion surrounding his BP. Most recent HbA1c 7.7 as of 08/29/15. On Metformin 1000 mg daily despite poor GFR. This may need to be readdressed at his next visit and potentially decreased. May benefit from another agent that is safer in patient's with CKD.

## 2015-09-27 NOTE — Assessment & Plan Note (Signed)
BP Readings from Last 3 Encounters:  09/26/15 160/102  08/29/15 221/132  10/18/14 156/87    Lab Results  Component Value Date   NA 142 10/18/2014   K 4.2 10/18/2014   CREATININE 2.24* 10/18/2014    Assessment: Blood pressure control:  Poorly controlled Progress toward BP goal:   Not at goal Comments: The bigger issue here seems to be the patient's lack of understanding of his medical disease, most likely related to what seems like mild cognitive impairment due to his stroke. Please see HPI for full details with regards to our visit. During his last visit, various medications were sent to pharmacy to simplify his regimen, however, he has not started taking any of the medications that per prescribed at his last appointment, except for Lisinopril 10 mg daily which was restarted. Apparently, his wife helps with his medications but she did not come to his visit today. At this time, he is taking Clonidine 0.3 mg ONCE daily (prescription was for 0.3 mg tid), Diltiazem 30 mg ONCE daily (previously prescribed as 30 mg tid), Lisinopril 10 mg daily, and Lasix 20 mg daily. He was supposed to be using Clonidine Patch in place of Clonidine pill (poor compliance with three times daily dosing), however, per the wife, she is not able to pay for this as it is too expensive. Also supposed to be using Dilt CD 120 mg daily but this Rx pas never picked up. Patient has not understanding of any of this.   Plan: Medications: For now, will continue Clonidine 0.3 mg TID (reinforced importance of taking this three times daily, also discussed with wife), as I suspect he will have rebound HTN and tachycardia if this is stopped abruptly, although this is not an ideal agent for this man as he has issues with understanding his meds. Also resent in Rx for Cardizem CD 120 mg daily. Instructed patient and wife not to take in addition to short acting pill also. Continue Lisinopril 10 mg daily, although question the safety of this  medication given his poor renal function. Also increased Lasix to 20 mg BID to gain some added BP benefit from diuretic with twice daily dosing.   Other plans: Discussed at length with Mannie Stabile, will attempt to set up mail order medications for simpler use and hopefull improved adherence. Discussed with wife, someone from their family must accompany the patient to his appointments as he is not able to understand his medications and changes. Also attempted to collect BMP today given CKD on ACEI and Metformin, although patient left without obtaining this lab draw. Will call patient to have him return.   Ideally, would discontinue Clonidine and start beta blocker such as Toprol-XL for once daily dosing.   RTC in 2 weeks, at which time will also have medication adherence and understanding discussion with pharmacy.

## 2015-09-27 NOTE — Telephone Encounter (Signed)
Call to patient's home phone number message left that patient will need to come in for lab work only.  Call to Mobile # spoke with patient's wife who said that she has an appointment this afternoon and will bring patient in to get labs done today to get labs done.  Sander Nephew, RN 09/27/2015 10:47 AM

## 2015-09-28 LAB — BMP8+ANION GAP
ANION GAP: 20 mmol/L — AB (ref 10.0–18.0)
BUN/Creatinine Ratio: 21 (ref 10–22)
BUN: 46 mg/dL — ABNORMAL HIGH (ref 8–27)
CO2: 23 mmol/L (ref 18–29)
CREATININE: 2.16 mg/dL — AB (ref 0.76–1.27)
Calcium: 9.3 mg/dL (ref 8.6–10.2)
Chloride: 103 mmol/L (ref 96–106)
GFR calc Af Amer: 35 mL/min/{1.73_m2} — ABNORMAL LOW (ref >59)
GFR calc non Af Amer: 31 mL/min/{1.73_m2} — ABNORMAL LOW (ref >59)
GLUCOSE: 150 mg/dL — AB (ref 65–99)
POTASSIUM: 4.1 mmol/L (ref 3.5–5.2)
SODIUM: 146 mmol/L — AB (ref 134–144)

## 2015-09-28 NOTE — Progress Notes (Signed)
Internal Medicine Clinic Attending  Case discussed with Dr. Jones at the time of the visit.  We reviewed the resident's history and exam and pertinent patient test results.  I agree with the assessment, diagnosis, and plan of care documented in the resident's note.  

## 2015-10-01 ENCOUNTER — Other Ambulatory Visit: Payer: Self-pay | Admitting: Pulmonary Disease

## 2015-10-14 ENCOUNTER — Other Ambulatory Visit: Payer: Self-pay | Admitting: Pulmonary Disease

## 2015-10-17 ENCOUNTER — Ambulatory Visit (INDEPENDENT_AMBULATORY_CARE_PROVIDER_SITE_OTHER): Payer: Medicare Other | Admitting: Pharmacist

## 2015-10-17 DIAGNOSIS — Z8673 Personal history of transient ischemic attack (TIA), and cerebral infarction without residual deficits: Secondary | ICD-10-CM | POA: Diagnosis not present

## 2015-10-17 DIAGNOSIS — Z7901 Long term (current) use of anticoagulants: Secondary | ICD-10-CM

## 2015-10-17 DIAGNOSIS — I6381 Other cerebral infarction due to occlusion or stenosis of small artery: Secondary | ICD-10-CM

## 2015-10-17 DIAGNOSIS — I6322 Cerebral infarction due to unspecified occlusion or stenosis of basilar arteries: Secondary | ICD-10-CM

## 2015-10-17 DIAGNOSIS — I63219 Cerebral infarction due to unspecified occlusion or stenosis of unspecified vertebral arteries: Secondary | ICD-10-CM

## 2015-10-17 LAB — POCT INR: INR: 2.8

## 2015-10-17 NOTE — Patient Instructions (Signed)
Patient instructed to take medications as defined in the Anti-coagulation Track section of this encounter.  Patient instructed to take today's dose.  Patient verbalized understanding of these instructions.    

## 2015-10-17 NOTE — Progress Notes (Signed)
Anti-Coagulation Progress Note  Patrick [name] is a 70 y.o. male who is currently on an anti-coagulation regimen.    RECENT RESULTS: Recent results are below, the most recent result is correlated with a dose of 32.5 mg. per week: Lab Results  Component Value Date   INR 2.8 10/17/2015   INR 3.0 09/26/2015   INR 1.80 08/29/2015    ANTI-COAG DOSE: Anticoagulation Dose Instructions as of 10/17/2015      [name] Tue Wed Thu Fri Sat   New Dose 5 mg 2.5 mg 5 mg 5 mg 5 mg 5 mg 5 mg       ANTICOAG SUMMARY: Anticoagulation Episode Summary    Current INR goal 2.0-3.0  Next INR check 11/07/2015  INR from last check 2.8 (10/17/2015)  Weekly max dose   Target end date Indefinite  [facility]  Preferred lab   Send INR reminders to    Indications  ATRIAL FIBRILLATION PAROXYSMAL CHRONIC (Resolved) [I48.91] Acute ischemic VBA thalamic stroke ([place]) [id number] I63.22] Chronic anticoagulation on coumadin [Z79.01]        Comments         ANTICOAG TODAY: Anticoagulation Summary as of 10/17/2015    INR goal 2.0-3.0  Selected INR 2.8 (10/17/2015)  Next INR check 11/07/2015  Target end date Indefinite   Indications  ATRIAL FIBRILLATION PAROXYSMAL CHRONIC (Resolved) [I48.91] Acute ischemic VBA thalamic stroke (HCC) [id number] I63.22] Chronic anticoagulation on coumadin [Z79.01]      Anticoagulation Episode Summary    [facility]   Preferred lab    Send INR reminders to    Comments       PATIENT INSTRUCTIONS: Patient Instructions  Patient instructed to take medications as defined in the Anti-coagulation Track section of this encounter.  Patient instructed to take today's dose.  Patient verbalized understanding of these instructions.        FOLLOW-UP Return in about 3 weeks (around 11/07/2015) for f/u INR at 2pm.  [name], III Pharm.D., CACP  Dimitri Ped, PharmD.

## 2015-10-21 ENCOUNTER — Telehealth: Payer: Self-pay | Admitting: *Deleted

## 2015-10-21 NOTE — Telephone Encounter (Signed)
Hey shana, traci mentioned that this pt was having some issues with memory and possibly some medicine compliance issues also wife is stressed, would [facility] be a possibility?

## 2015-10-24 NOTE — Telephone Encounter (Signed)
Yes, he is [facility] eligible.  If Mr. Yehl is agreeable, I can make referral.  Home Health is an option as well.

## 2015-10-28 ENCOUNTER — Telehealth: Payer: Self-pay | Admitting: Pharmacist

## 2015-10-28 ENCOUNTER — Encounter: Payer: Self-pay | Admitting: Pharmacist

## 2015-10-28 NOTE — Telephone Encounter (Signed)
Patient's wife, [name], requested call back for assistance with medications. In general, she is asking for help understanding the regimen.  Medication(s) were reviewed with [name] including name, instructions, indication, goals of therapy, potential side effects, importance of adherence, and safe use.  Patient verbalized understanding by repeating back information and was advised to contact me if further medication-related questions arise. Patient was also provided an information handout.

## 2015-11-02 ENCOUNTER — Other Ambulatory Visit: Payer: Self-pay | Admitting: Pulmonary Disease

## 2015-11-07 ENCOUNTER — Ambulatory Visit: Payer: Medicare Other

## 2015-11-14 ENCOUNTER — Ambulatory Visit (INDEPENDENT_AMBULATORY_CARE_PROVIDER_SITE_OTHER): Payer: Medicare Other | Admitting: Pharmacist

## 2015-11-14 DIAGNOSIS — Z7901 Long term (current) use of anticoagulants: Secondary | ICD-10-CM

## 2015-11-14 DIAGNOSIS — Z8673 Personal history of transient ischemic attack (TIA), and cerebral infarction without residual deficits: Secondary | ICD-10-CM

## 2015-11-14 DIAGNOSIS — I6381 Other cerebral infarction due to occlusion or stenosis of small artery: Secondary | ICD-10-CM

## 2015-11-14 DIAGNOSIS — I63219 Cerebral infarction due to unspecified occlusion or stenosis of unspecified vertebral arteries: Secondary | ICD-10-CM

## 2015-11-14 DIAGNOSIS — I6322 Cerebral infarction due to unspecified occlusion or stenosis of basilar arteries: Secondary | ICD-10-CM

## 2015-11-14 LAB — POCT INR: INR: 3

## 2015-11-14 NOTE — Progress Notes (Signed)
Anti-Coagulation Progress Note  Patrick [name] is a 70 y.o. male who is currently on an anti-coagulation regimen.    RECENT RESULTS: Recent results are below, the most recent result is correlated with a dose of 32.5 mg. per week: Lab Results  Component Value Date   INR 3.00 11/14/2015   INR 2.8 10/17/2015   INR 3.0 09/26/2015    ANTI-COAG DOSE: Anticoagulation Dose Instructions as of 11/14/2015      [name] Tue Wed Thu Fri Sat   New Dose 5 mg 2.5 mg 5 mg 5 mg 2.5 mg 5 mg 5 mg       ANTICOAG SUMMARY: Anticoagulation Episode Summary    Current INR goal 2.0-3.0  Next INR check 12/12/2015  INR from last check 3.00 (11/14/2015)  Weekly max dose   Target end date Indefinite  [facility]  Preferred lab   Send INR reminders to    Indications  ATRIAL FIBRILLATION PAROXYSMAL CHRONIC (Resolved) [I48.91] Acute ischemic VBA thalamic stroke ([place]) [id number] I63.22] Chronic anticoagulation on coumadin [Z79.01]        Comments         ANTICOAG TODAY: Anticoagulation Summary as of 11/14/2015    INR goal 2.0-3.0  Selected INR 3.00 (11/14/2015)  Next INR check 12/12/2015  Target end date Indefinite   Indications  ATRIAL FIBRILLATION PAROXYSMAL CHRONIC (Resolved) [I48.91] Acute ischemic VBA thalamic stroke (HCC) [id number] I63.22] Chronic anticoagulation on coumadin [Z79.01]      Anticoagulation Episode Summary    [facility]   Preferred lab    Send INR reminders to    Comments       PATIENT INSTRUCTIONS: Patient Instructions  Patient instructed to take medications as defined in the Anti-coagulation Track section of this encounter.  Patient instructed to take today's dose.  Patient verbalized understanding of these instructions.       FOLLOW-UP Return in 4 weeks (on 12/12/2015) for Follow up INR at [place], III Pharm.D., CACP

## 2015-11-14 NOTE — Patient Instructions (Signed)
Patient instructed to take medications as defined in the Anti-coagulation Track section of this encounter.  Patient instructed to take today's dose.  Patient verbalized understanding of these instructions.    

## 2015-11-14 NOTE — Progress Notes (Signed)
Indication: Permanent atrial fibrillation. Duration: Indefinite. INR: At target. Agree with Dr. Groce's assessment and plan. 

## 2015-11-15 ENCOUNTER — Telehealth: Payer: Self-pay | Admitting: *Deleted

## 2015-11-15 ENCOUNTER — Ambulatory Visit (INDEPENDENT_AMBULATORY_CARE_PROVIDER_SITE_OTHER): Payer: Medicare Other | Admitting: Internal Medicine

## 2015-11-15 ENCOUNTER — Ambulatory Visit (HOSPITAL_COMMUNITY)
Admission: RE | Admit: 2015-11-15 | Discharge: 2015-11-15 | Disposition: A | Discharge status: Home / Self Care | Payer: Medicare Other | Source: Ambulatory Visit | Attending: Internal Medicine | Admitting: Internal Medicine

## 2015-11-15 ENCOUNTER — Encounter: Payer: Self-pay | Admitting: Internal Medicine

## 2015-11-15 VITALS — BP 253/130 | HR 93 | Temp 98.7°F | Wt 178.0 lb

## 2015-11-15 DIAGNOSIS — M79659 Pain in unspecified thigh: Secondary | ICD-10-CM | POA: Diagnosis not present

## 2015-11-15 DIAGNOSIS — M79652 Pain in left thigh: Secondary | ICD-10-CM | POA: Diagnosis present

## 2015-11-15 DIAGNOSIS — I10 Essential (primary) hypertension: Secondary | ICD-10-CM

## 2015-11-15 DIAGNOSIS — I161 Hypertensive emergency: Secondary | ICD-10-CM | POA: Diagnosis not present

## 2015-11-15 DIAGNOSIS — R51 Headache: Principal | ICD-10-CM

## 2015-11-15 DIAGNOSIS — M79651 Pain in right thigh: Secondary | ICD-10-CM

## 2015-11-15 DIAGNOSIS — Z9114 Patient's other noncompliance with medication regimen: Secondary | ICD-10-CM | POA: Diagnosis not present

## 2015-11-15 DIAGNOSIS — Z87891 Personal history of nicotine dependence: Secondary | ICD-10-CM | POA: Diagnosis not present

## 2015-11-15 DIAGNOSIS — I471 Supraventricular tachycardia: Secondary | ICD-10-CM | POA: Diagnosis not present

## 2015-11-15 DIAGNOSIS — I674 Hypertensive encephalopathy: Secondary | ICD-10-CM | POA: Diagnosis not present

## 2015-11-15 DIAGNOSIS — R519 Headache, unspecified: Secondary | ICD-10-CM

## 2015-11-15 DIAGNOSIS — I6340 Cerebral infarction due to embolism of unspecified cerebral artery: Secondary | ICD-10-CM | POA: Diagnosis not present

## 2015-11-15 DIAGNOSIS — N184 Chronic kidney disease, stage 4 (severe): Secondary | ICD-10-CM | POA: Diagnosis not present

## 2015-11-15 DIAGNOSIS — N179 Acute kidney failure, unspecified: Secondary | ICD-10-CM | POA: Diagnosis not present

## 2015-11-15 LAB — GLUCOSE, CAPILLARY: GLUCOSE-CAPILLARY: 132 mg/dL — AB (ref 65–99)

## 2015-11-15 LAB — LACTIC ACID, PLASMA: Lactic Acid, Venous: 1 mmol/L (ref 0.5–2.0)

## 2015-11-15 NOTE — Telephone Encounter (Signed)
Please note results of CT of today

## 2015-11-15 NOTE — Telephone Encounter (Signed)
CT head without acute findings-->pt and son notified.

## 2015-11-15 NOTE — Progress Notes (Signed)
Case discussed with Dr. Gordy Levan at the time of the visit.  We reviewed the resident's history and exam and pertinent patient test results.  I agree with the assessment, diagnosis and plan of care documented in the resident's note.  Head CT w/o contrast was negative for acute bleed.  Headache likely secondary to his hypertension.  I suspect the acute hypertension is related to rebound from missing his clonidine.  He was instructed to take his antihypertensives regularly.  If compliance is an issue weaning him off of clonidine and starting an alternative agent may be prudent.  Will defer this decision to Patrick [name] and his PCP.

## 2015-11-15 NOTE — Patient Instructions (Signed)
Thank you for your visit today.   Please return to the internal medicine clinic in 2 weeks or sooner if needed.     I have made the following additions/changes to your medications:  Continue your current medications.   Please be sure to take your blood pressure medications daily.    Please be sure to bring all of your medications with you to every visit; this includes herbal supplements, vitamins, eye drops, and any over-the-counter medications.   Should you have any questions regarding your medications and/or any new or worsening symptoms, please be sure to call the clinic at [phone].   If you believe that you are suffering from a life threatening condition or one that may result in the loss of limb or function, then you should call 911 and proceed to the nearest Emergency Department.   A healthy lifestyle and preventative care can promote health and wellness.   Maintain regular health, dental, and eye exams.  Eat a healthy diet. Foods like vegetables, fruits, whole grains, low-fat dairy products, and lean protein foods contain the nutrients you need without too many calories. Decrease your intake of foods high in solid fats, added sugars, and salt. Get information about a proper diet from your caregiver, if necessary.  Regular physical exercise is one of the most important things you can do for your health. Most adults should get at least 150 minutes of moderate-intensity exercise (any activity that increases your heart rate and causes you to sweat) each week. In addition, most adults need muscle-strengthening exercises on 2 or more days a week.   Maintain a healthy weight. The body mass index (BMI) is a screening tool to identify possible weight problems. It provides an estimate of body fat based on height and weight. Your caregiver can help determine your BMI, and can help you achieve or maintain a healthy weight. For adults 20 years and older:  A BMI below 18.5 is considered  underweight.  A BMI of 18.5 to 24.9 is normal.  A BMI of 25 to 29.9 is considered overweight.  A BMI of 30 and above is considered obese.

## 2015-11-15 NOTE — Assessment & Plan Note (Addendum)
Pt has experienced thigh pain since about 1 week ago.  Pain is mainly in the back of the thighs bilaterally.  Denies any trauma or falls.  He has experienced no weakness.  Neuro exam normal with intact strength and sensation.  No swelling.  He is on a statin.  Etiology unclear.  -check CMP, cbc, CK, ESR, lactic acid

## 2015-11-15 NOTE — Progress Notes (Signed)
Patient ID: Patrick [name], male   DOB: 03-Jan-1946, 70 y.o.   MRN: [id number]     Subjective:   Patient ID: Patrick [name] male    DOB: 07-24-1946 70 y.o.    MRN: [id number] Health Maintenance Due: Health Maintenance Due  Topic Date Due  . Hepatitis C Screening  [birth year]  . ZOSTAVAX  12/01/2007  . PNA vac Low Risk Adult (1 of 2 - PCV13) 11/30/2012  . LIPID PANEL  03/30/2015  . OPHTHALMOLOGY EXAM  07/01/2015    _________________________________________________  HPI: [name] is a 70 y.o. male here for an acute visit for HA and leg pain.  Pt has a PMH outlined below.  Please see problem-based charting assessment and plan for further status of patient's chronic medical problems addressed at today's visit.  PMH: Past Medical History  Diagnosis Date  . Hypertension   . CKD (chronic kidney disease), stage II   . CAD (coronary artery disease)   . Depression   . Gout   . Hyperlipidemia   . 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 GERD (gastroesophageal reflux disease)   . Diabetes mellitus type II     Insulin dependent  . Upper GI bleeding 07/01/2013  . Paroxysmal atrial fibrillation (HCC)     on coumadin  . Atrial fibrillation ([place])   . Myocardial infarction [facility]) 2000's    "near heart attack" (07/14/2013)  . Shortness of breath     "can happen at any time" (07/14/2013)  . Sleep apnea 07/2010    "has mask at home; seldom uses it" (07/14/2013)  . Stomach ulcer 1950's    "as a teenager"  . Stroke [facility]) ~ 2007; ~ 2009    "memory not as good since" (07/14/2013)  . Permanent atrial fibrillation ([place]) 07/17/2013  . Chronic anticoagulation, on coumadin 07/17/2013  . Tachy-brady syndrome ([place]) 07/17/2013    Medications: Current Outpatient Prescriptions on File Prior to Visit  Medication Sig Dispense Refill  . allopurinol (ZYLOPRIM) 100 MG tablet TAKE TWO TABLETS BY MOUTH ONCE DAILY 60 tablet 0  . aspirin 81 MG EC tablet  Take 81 mg by mouth daily.      . Blood Glucose Monitoring Suppl (ACCU-CHEK AVIVA PLUS) W/DEVICE KIT 1 each by Does not apply route as needed. 1 kit 0  . cloNIDine (CATAPRES) 0.3 MG tablet Take 1 tablet (0.3 mg total) by mouth 3 (three) times daily. 90 tablet 5  . diltiazem (DILACOR XR) 120 MG 24 hr capsule Take 1 capsule (120 mg total) by mouth daily. 30 capsule 1  . furosemide (LASIX) 20 MG tablet Take 1 tablet (20 mg total) by mouth 2 (two) times daily. 60 tablet 5  . glucose blood (TRUETRACK TEST) test strip Use to test blood sugar 2-3 times daily dx code 250.00 100 each 11  . Lancets 30G MISC Use to test blood glucose 2-3 times daily dx code 250.00 100 each 11  . lisinopril (PRINIVIL,ZESTRIL) 10 MG tablet Take 1 tablet (10 mg total) by mouth daily. 90 tablet 0  . metFORMIN (GLUCOPHAGE-XR) 500 MG 24 hr tablet TAKE TWO TABLETS BY MOUTH DAILY WITH  BREAKFAST 60 tablet 0  . pravastatin (PRAVACHOL) 80 MG tablet Take 1 tablet (80 mg total) by mouth daily. 90 tablet 3  . warfarin (COUMADIN) 5 MG tablet Take 1 tablet (5 mg total) by mouth daily at 6 PM. 30 tablet 1   No current facility-administered medications on file prior  to visit.    Allergies: No Known Allergies  FH: No family history on file.  SH: Social History   Social History  . Marital Status: Married    [name]  . Number of Children: N/A  . Years of Education: 5   Occupational History  .     Social History Main Topics  . Smoking status: Former Smoker -- 0.50 packs/day for 15 years    Types: Cigarettes    Quit date: 10/26/1976  . Smokeless tobacco: Former Systems developer     Comment: 07/14/2013 "quit chewing and dipping in ~ 1970"  . Alcohol Use: 0.0 oz/week    0 Standard drinks or equivalent per week     Comment: 07/14/2013 "drank a little; quit in ~ 1970; never had problem w/it"  . Drug Use: No  . Sexual Activity: Not Currently   Other Topics Concern  . None   Social History Narrative    Review of  Systems: Constitutional: Negative for fever, chills.  Eyes: Negative for blurred vision.  Respiratory: Negative for cough and shortness of breath.  Cardiovascular: Negative for chest pain.  Gastrointestinal: Negative for nausea, vomiting. Neurological: Negative for dizziness.   Objective:   Vital Signs: Filed Vitals:   11/15/15 1029 11/15/15 1040  BP: 242/159 253/130  Pulse: 105 93  Temp: 98.7 F (37.1 C)   TempSrc: Oral   Weight: 178 lb (80.74 kg)   SpO2: 100%       BP Readings from Last 3 Encounters:  11/15/15 253/130  09/26/15 160/102  08/29/15 221/132    Physical Exam: Constitutional: Vital signs reviewed.  Patient is in NAD and cooperative with exam.  Head: Normocephalic and atraumatic. Eyes: EOMI, conjunctivae nl, no scleral icterus.  Neck: Supple. Cardiovascular: RRR, no MRG. Pulmonary/Chest: normal effort, CTAB, no wheezes, rales, or rhonchi. Abdominal: Soft. NT/ND +BS. Musculoskeletal: Bilateral LE: FROM, without pain,swelling, or deformity, strength and sensation intact.  No thigh tenderness.    Neurological: A&O x3, cranial nerves II-XII are intact, moving all extremities. Extremities: No LE edema. Skin: Warm, dry and intact. No rash.   Assessment & Plan:   Assessment and plan was discussed and formulated with my attending.

## 2015-11-15 NOTE — Assessment & Plan Note (Addendum)
Pt p/w acute headache since yesterday which has somewhat improved.  He describes the headache as a tightness around the top of his head bilaterally.  No changes in vision.  No photophobia, phonophobia, meningeal signs, nausea, vomiting.  Does not have a history of HA.  No URI symptoms.  He has not taken his BP meds and has a h/o noncompliance.  He has a h/o previous strokes in the past during which time he also experienced HA.  He is on chronic coumadin for atrial fibrillation for which is is compliant.  Neuro exam normal but with previous history of stroke, uncontrolled HTN on coumadin, and no previous HA, I feel he warrants a CT head.  However, likely also could be related to uncontrolled HTN.  -CT head stat  -advised not to miss doses of BP meds -clonidine is likely not the best choice for him given history of noncompliance

## 2015-11-15 NOTE — Assessment & Plan Note (Signed)
Pt has a h/o noncompliance with meds. His wife has been hospitalized and he reports not taking his meds yesterday. -advised to take meds consistently -clonidine is probably not the best choice given his noncompliance, would consider tapering off with titration up of other meds

## 2015-11-15 NOTE — Telephone Encounter (Signed)
Dr. Gordy Levan saw this patient today.

## 2015-11-16 ENCOUNTER — Other Ambulatory Visit: Payer: Self-pay | Admitting: Pulmonary Disease

## 2015-11-16 ENCOUNTER — Ambulatory Visit (HOSPITAL_COMMUNITY): Payer: Medicare Other

## 2015-11-16 LAB — CMP14 + ANION GAP
A/G RATIO: 1.5 (ref 1.2–2.2)
ALBUMIN: 4.3 g/dL (ref 3.6–4.8)
ALT: 11 IU/L (ref 0–44)
AST: 13 IU/L (ref 0–40)
Alkaline Phosphatase: 85 IU/L (ref 39–117)
Anion Gap: 22 mmol/L — ABNORMAL HIGH (ref 10.0–18.0)
BUN / CREAT RATIO: 15 (ref 10–24)
BUN: 38 mg/dL — AB (ref 8–27)
Bilirubin Total: 0.6 mg/dL (ref 0.0–1.2)
CALCIUM: 9.2 mg/dL (ref 8.6–10.2)
CO2: 23 mmol/L (ref 18–29)
Chloride: 98 mmol/L (ref 96–106)
Creatinine, Ser: 2.5 mg/dL — ABNORMAL HIGH (ref 0.76–1.27)
GFR, EST AFRICAN AMERICAN: 30 mL/min/{1.73_m2} — AB (ref >59)
GFR, EST NON AFRICAN AMERICAN: 26 mL/min/{1.73_m2} — AB (ref >59)
GLOBULIN, TOTAL: 2.8 g/dL (ref 1.5–4.5)
GLUCOSE: 154 mg/dL — AB (ref 65–99)
POTASSIUM: 4.1 mmol/L (ref 3.5–5.2)
Sodium: 143 mmol/L (ref 134–144)
TOTAL PROTEIN: 7.1 g/dL (ref 6.0–8.5)

## 2015-11-16 LAB — CBC
HEMOGLOBIN: 12.6 g/dL (ref 12.6–17.7)
Hematocrit: 39.2 % (ref 37.5–51.0)
MCH: 25.4 pg — AB (ref 26.6–33.0)
MCHC: 32.1 g/dL (ref 31.5–35.7)
MCV: 79 fL (ref 79–97)
Platelets: 196 10*3/uL (ref 150–379)
RBC: 4.97 x10E6/uL (ref 4.14–5.80)
RDW: 15.1 % (ref 12.3–15.4)
WBC: 5.6 10*3/uL (ref 3.4–10.8)

## 2015-11-16 LAB — SEDIMENTATION RATE: Sed Rate: 25 mm/hr (ref 0–30)

## 2015-11-16 LAB — CK: CK TOTAL: 83 U/L (ref 24–204)

## 2015-11-16 NOTE — Telephone Encounter (Signed)
Last seen yesterday by Dr Gordy Levan.

## 2015-11-17 NOTE — Telephone Encounter (Addendum)
Cr Cl 33 by original Cockcroft-Gault. He is at 50% of maximal dose of Metformin. Will need to monitor renal function very closely (recheck at latest in 3 months). Will probably switch him over to insulin at next follow up (scheduled for 4/27). Could consider glipizide as well. (Per UpToDate: Glipizide is primarily converted to inactive metabolites and may be less likely to cause hypoglycemia in patients with renal impairment compared to other sulfonylureas)

## 2015-11-18 ENCOUNTER — Emergency Department (HOSPITAL_COMMUNITY): Payer: Medicare Other

## 2015-11-18 ENCOUNTER — Encounter (HOSPITAL_COMMUNITY): Payer: Self-pay | Admitting: Emergency Medicine

## 2015-11-18 ENCOUNTER — Other Ambulatory Visit: Payer: Self-pay

## 2015-11-18 ENCOUNTER — Inpatient Hospital Stay (HOSPITAL_COMMUNITY)
Admission: EM | Admit: 2015-11-18 | Discharge: 2015-11-22 | DRG: 304 | Disposition: A | Discharge status: Rehabilitation Facility | Payer: Medicare Other | Attending: Internal Medicine | Admitting: Internal Medicine

## 2015-11-18 DIAGNOSIS — G4733 Obstructive sleep apnea (adult) (pediatric): Secondary | ICD-10-CM | POA: Diagnosis present

## 2015-11-18 DIAGNOSIS — I674 Hypertensive encephalopathy: Secondary | ICD-10-CM

## 2015-11-18 DIAGNOSIS — N179 Acute kidney failure, unspecified: Secondary | ICD-10-CM | POA: Insufficient documentation

## 2015-11-18 DIAGNOSIS — I130 Hypertensive heart and chronic kidney disease with heart failure and stage 1 through stage 4 chronic kidney disease, or unspecified chronic kidney disease: Secondary | ICD-10-CM | POA: Diagnosis present

## 2015-11-18 DIAGNOSIS — G8191 Hemiplegia, unspecified affecting right dominant side: Secondary | ICD-10-CM | POA: Diagnosis present

## 2015-11-18 DIAGNOSIS — E119 Type 2 diabetes mellitus without complications: Secondary | ICD-10-CM | POA: Diagnosis not present

## 2015-11-18 DIAGNOSIS — I509 Heart failure, unspecified: Secondary | ICD-10-CM | POA: Diagnosis not present

## 2015-11-18 DIAGNOSIS — Z7982 Long term (current) use of aspirin: Secondary | ICD-10-CM

## 2015-11-18 DIAGNOSIS — Z91128 Patient's intentional underdosing of medication regimen for other reason: Secondary | ICD-10-CM

## 2015-11-18 DIAGNOSIS — N183 Chronic kidney disease, stage 3 (moderate): Secondary | ICD-10-CM | POA: Diagnosis not present

## 2015-11-18 DIAGNOSIS — G4730 Sleep apnea, unspecified: Secondary | ICD-10-CM | POA: Diagnosis present

## 2015-11-18 DIAGNOSIS — I63139 Cerebral infarction due to embolism of unspecified carotid artery: Secondary | ICD-10-CM | POA: Diagnosis not present

## 2015-11-18 DIAGNOSIS — R51 Headache: Secondary | ICD-10-CM | POA: Diagnosis not present

## 2015-11-18 DIAGNOSIS — I480 Paroxysmal atrial fibrillation: Secondary | ICD-10-CM | POA: Diagnosis present

## 2015-11-18 DIAGNOSIS — R531 Weakness: Secondary | ICD-10-CM

## 2015-11-18 DIAGNOSIS — N184 Chronic kidney disease, stage 4 (severe): Secondary | ICD-10-CM | POA: Diagnosis present

## 2015-11-18 DIAGNOSIS — Z66 Do not resuscitate: Secondary | ICD-10-CM | POA: Diagnosis not present

## 2015-11-18 DIAGNOSIS — I69392 Facial weakness following cerebral infarction: Secondary | ICD-10-CM | POA: Diagnosis not present

## 2015-11-18 DIAGNOSIS — I10 Essential (primary) hypertension: Secondary | ICD-10-CM

## 2015-11-18 DIAGNOSIS — R791 Abnormal coagulation profile: Secondary | ICD-10-CM | POA: Insufficient documentation

## 2015-11-18 DIAGNOSIS — I6340 Cerebral infarction due to embolism of unspecified cerebral artery: Secondary | ICD-10-CM | POA: Diagnosis not present

## 2015-11-18 DIAGNOSIS — I482 Chronic atrial fibrillation: Secondary | ICD-10-CM | POA: Diagnosis present

## 2015-11-18 DIAGNOSIS — M109 Gout, unspecified: Secondary | ICD-10-CM | POA: Diagnosis present

## 2015-11-18 DIAGNOSIS — E1165 Type 2 diabetes mellitus with hyperglycemia: Secondary | ICD-10-CM | POA: Diagnosis not present

## 2015-11-18 DIAGNOSIS — K219 Gastro-esophageal reflux disease without esophagitis: Secondary | ICD-10-CM | POA: Diagnosis present

## 2015-11-18 DIAGNOSIS — Z79899 Other long term (current) drug therapy: Secondary | ICD-10-CM | POA: Diagnosis not present

## 2015-11-18 DIAGNOSIS — Z8711 Personal history of peptic ulcer disease: Secondary | ICD-10-CM

## 2015-11-18 DIAGNOSIS — Z8673 Personal history of transient ischemic attack (TIA), and cerebral infarction without residual deficits: Secondary | ICD-10-CM | POA: Insufficient documentation

## 2015-11-18 DIAGNOSIS — I6930 Unspecified sequelae of cerebral infarction: Secondary | ICD-10-CM | POA: Insufficient documentation

## 2015-11-18 DIAGNOSIS — I69311 Memory deficit following cerebral infarction: Secondary | ICD-10-CM | POA: Diagnosis not present

## 2015-11-18 DIAGNOSIS — I63413 Cerebral infarction due to embolism of bilateral middle cerebral arteries: Secondary | ICD-10-CM | POA: Diagnosis not present

## 2015-11-18 DIAGNOSIS — N185 Chronic kidney disease, stage 5: Secondary | ICD-10-CM | POA: Diagnosis present

## 2015-11-18 DIAGNOSIS — E113419 Type 2 diabetes mellitus with severe nonproliferative diabetic retinopathy with macular edema, unspecified eye: Secondary | ICD-10-CM | POA: Diagnosis present

## 2015-11-18 DIAGNOSIS — I161 Hypertensive emergency: Secondary | ICD-10-CM | POA: Diagnosis not present

## 2015-11-18 DIAGNOSIS — I2510 Atherosclerotic heart disease of native coronary artery without angina pectoris: Secondary | ICD-10-CM | POA: Diagnosis present

## 2015-11-18 DIAGNOSIS — G441 Vascular headache, not elsewhere classified: Secondary | ICD-10-CM | POA: Insufficient documentation

## 2015-11-18 DIAGNOSIS — R29705 NIHSS score 5: Secondary | ICD-10-CM | POA: Diagnosis present

## 2015-11-18 DIAGNOSIS — F329 Major depressive disorder, single episode, unspecified: Secondary | ICD-10-CM | POA: Diagnosis present

## 2015-11-18 DIAGNOSIS — I639 Cerebral infarction, unspecified: Secondary | ICD-10-CM | POA: Diagnosis not present

## 2015-11-18 DIAGNOSIS — I5020 Unspecified systolic (congestive) heart failure: Secondary | ICD-10-CM

## 2015-11-18 DIAGNOSIS — Z7984 Long term (current) use of oral hypoglycemic drugs: Secondary | ICD-10-CM | POA: Diagnosis not present

## 2015-11-18 DIAGNOSIS — Z87891 Personal history of nicotine dependence: Secondary | ICD-10-CM

## 2015-11-18 DIAGNOSIS — I4821 Permanent atrial fibrillation: Secondary | ICD-10-CM | POA: Diagnosis present

## 2015-11-18 DIAGNOSIS — T465X6A Underdosing of other antihypertensive drugs, initial encounter: Secondary | ICD-10-CM | POA: Diagnosis present

## 2015-11-18 DIAGNOSIS — I69398 Other sequelae of cerebral infarction: Secondary | ICD-10-CM | POA: Diagnosis not present

## 2015-11-18 DIAGNOSIS — I5032 Chronic diastolic (congestive) heart failure: Secondary | ICD-10-CM | POA: Diagnosis not present

## 2015-11-18 DIAGNOSIS — I471 Supraventricular tachycardia: Secondary | ICD-10-CM | POA: Diagnosis present

## 2015-11-18 DIAGNOSIS — E785 Hyperlipidemia, unspecified: Secondary | ICD-10-CM | POA: Diagnosis present

## 2015-11-18 DIAGNOSIS — G459 Transient cerebral ischemic attack, unspecified: Secondary | ICD-10-CM | POA: Insufficient documentation

## 2015-11-18 DIAGNOSIS — I252 Old myocardial infarction: Secondary | ICD-10-CM

## 2015-11-18 DIAGNOSIS — Z7901 Long term (current) use of anticoagulants: Secondary | ICD-10-CM | POA: Diagnosis not present

## 2015-11-18 DIAGNOSIS — Z9114 Patient's other noncompliance with medication regimen: Secondary | ICD-10-CM

## 2015-11-18 DIAGNOSIS — I169 Hypertensive crisis, unspecified: Secondary | ICD-10-CM | POA: Diagnosis present

## 2015-11-18 LAB — COMPREHENSIVE METABOLIC PANEL
ALBUMIN: 4.4 g/dL (ref 3.5–5.0)
ALK PHOS: 76 U/L (ref 38–126)
ALT: 13 U/L — ABNORMAL LOW (ref 17–63)
ANION GAP: 20 — AB (ref 5–15)
AST: 23 U/L (ref 15–41)
BUN: 41 mg/dL — ABNORMAL HIGH (ref 6–20)
CALCIUM: 9.8 mg/dL (ref 8.9–10.3)
CO2: 20 mmol/L — AB (ref 22–32)
Chloride: 105 mmol/L (ref 101–111)
Creatinine, Ser: 2.97 mg/dL — ABNORMAL HIGH (ref 0.61–1.24)
GFR calc Af Amer: 24 mL/min — ABNORMAL LOW (ref >60)
GFR calc non Af Amer: 20 mL/min — ABNORMAL LOW (ref >60)
GLUCOSE: 251 mg/dL — AB (ref 65–99)
POTASSIUM: 3.7 mmol/L (ref 3.5–5.1)
SODIUM: 145 mmol/L (ref 135–145)
Total Bilirubin: 1 mg/dL (ref 0.3–1.2)
Total Protein: 7.8 g/dL (ref 6.5–8.1)

## 2015-11-18 LAB — URINALYSIS, ROUTINE W REFLEX MICROSCOPIC
Bilirubin Urine: NEGATIVE
Glucose, UA: 100 mg/dL — AB
KETONES UR: NEGATIVE mg/dL
LEUKOCYTES UA: NEGATIVE
NITRITE: NEGATIVE
Specific Gravity, Urine: 1.014 (ref 1.005–1.030)
pH: 6.5 (ref 5.0–8.0)

## 2015-11-18 LAB — DIFFERENTIAL
BASOS ABS: 0 10*3/uL (ref 0.0–0.1)
Basophils Relative: 0 %
EOS PCT: 1 %
Eosinophils Absolute: 0.1 10*3/uL (ref 0.0–0.7)
Lymphocytes Relative: 10 %
Lymphs Abs: 0.7 10*3/uL (ref 0.7–4.0)
Monocytes Absolute: 0.5 10*3/uL (ref 0.1–1.0)
Monocytes Relative: 6 %
NEUTROS PCT: 83 %
Neutro Abs: 5.9 10*3/uL (ref 1.7–7.7)

## 2015-11-18 LAB — CBG MONITORING, ED: GLUCOSE-CAPILLARY: 238 mg/dL — AB (ref 65–99)

## 2015-11-18 LAB — URINE MICROSCOPIC-ADD ON

## 2015-11-18 LAB — PROTIME-INR
INR: 1.97 — AB (ref 0.00–1.49)
PROTHROMBIN TIME: 21.6 s — AB (ref 11.6–15.2)

## 2015-11-18 LAB — CBC
HCT: 40.6 % (ref 39.0–52.0)
Hemoglobin: 12.9 g/dL — ABNORMAL LOW (ref 13.0–17.0)
MCH: 25.4 pg — AB (ref 26.0–34.0)
MCHC: 31.8 g/dL (ref 30.0–36.0)
MCV: 80.1 fL (ref 78.0–100.0)
PLATELETS: 241 10*3/uL (ref 150–400)
RBC: 5.07 MIL/uL (ref 4.22–5.81)
RDW: 14.5 % (ref 11.5–15.5)
WBC: 7.1 10*3/uL (ref 4.0–10.5)

## 2015-11-18 LAB — I-STAT TROPONIN, ED: Troponin i, poc: 0.17 ng/mL (ref 0.00–0.08)

## 2015-11-18 LAB — APTT: APTT: 35 s (ref 24–37)

## 2015-11-18 LAB — LACTIC ACID, PLASMA: Lactic Acid, Venous: 1.7 mmol/L (ref 0.5–2.0)

## 2015-11-18 LAB — GLUCOSE, CAPILLARY: Glucose-Capillary: 201 mg/dL — ABNORMAL HIGH (ref 65–99)

## 2015-11-18 MED ORDER — MORPHINE SULFATE (PF) 4 MG/ML IV SOLN
4.0000 mg | Freq: Once | INTRAVENOUS | Status: AC
  Administered 2015-11-18: 4 mg via INTRAVENOUS
  Filled 2015-11-18: qty 1

## 2015-11-18 MED ORDER — LACTATED RINGERS IV BOLUS (SEPSIS)
1000.0000 mL | Freq: Once | INTRAVENOUS | Status: DC
  Administered 2015-11-18: 1000 mL via INTRAVENOUS

## 2015-11-18 MED ORDER — LISINOPRIL 10 MG PO TABS: 10.0000 mg | ORAL_TABLET | Freq: Every day | ORAL | Status: DC

## 2015-11-18 MED ORDER — INSULIN ASPART 100 UNIT/ML ~~LOC~~ SOLN
0.0000 [IU] | Freq: Three times a day (TID) | SUBCUTANEOUS | Status: DC
  Administered 2015-11-19 (×3): 3 [IU] via SUBCUTANEOUS
  Administered 2015-11-20: 2 [IU] via SUBCUTANEOUS

## 2015-11-18 MED ORDER — ASPIRIN EC 81 MG PO TBEC
81.0000 mg | DELAYED_RELEASE_TABLET | Freq: Every day | ORAL | Status: DC
  Administered 2015-11-19 – 2015-11-22 (×4): 81 mg via ORAL
  Filled 2015-11-18 (×5): qty 1

## 2015-11-18 MED ORDER — LABETALOL HCL 5 MG/ML IV SOLN
20.0000 mg | Freq: Once | INTRAVENOUS | Status: AC
  Administered 2015-11-18: 20 mg via INTRAVENOUS
  Filled 2015-11-18: qty 4

## 2015-11-18 MED ORDER — WARFARIN - PHARMACIST DOSING INPATIENT
Freq: Every day | Status: DC
  Administered 2015-11-20: 18:00:00

## 2015-11-18 MED ORDER — ALLOPURINOL 100 MG PO TABS
200.0000 mg | ORAL_TABLET | Freq: Every day | ORAL | Status: DC
  Administered 2015-11-19 – 2015-11-22 (×4): 200 mg via ORAL
  Filled 2015-11-18 (×4): qty 2

## 2015-11-18 MED ORDER — SODIUM CHLORIDE 0.9 % IV BOLUS (SEPSIS)
1000.0000 mL | Freq: Once | INTRAVENOUS | Status: AC
  Administered 2015-11-18: 1000 mL via INTRAVENOUS

## 2015-11-18 MED ORDER — FENTANYL CITRATE (PF) 100 MCG/2ML IJ SOLN
12.5000 ug | INTRAMUSCULAR | Status: DC | PRN
  Administered 2015-11-19: 25 ug via INTRAVENOUS
  Administered 2015-11-19: 50 ug via INTRAVENOUS
  Administered 2015-11-19: 25 ug via INTRAVENOUS
  Filled 2015-11-18 (×3): qty 2

## 2015-11-18 MED ORDER — ONDANSETRON HCL 4 MG/2ML IJ SOLN
4.0000 mg | Freq: Four times a day (QID) | INTRAMUSCULAR | Status: DC | PRN
  Administered 2015-11-18: 4 mg via INTRAVENOUS
  Filled 2015-11-18: qty 2

## 2015-11-18 MED ORDER — DILTIAZEM HCL ER COATED BEADS 120 MG PO CP24
120.0000 mg | ORAL_CAPSULE | Freq: Every day | ORAL | Status: DC
  Administered 2015-11-18 – 2015-11-22 (×5): 120 mg via ORAL
  Filled 2015-11-18 (×5): qty 1

## 2015-11-18 MED ORDER — ASPIRIN 81 MG PO TBEC: 81.0000 mg | DELAYED_RELEASE_TABLET | Freq: Every day | ORAL | Status: DC

## 2015-11-18 MED ORDER — ASPIRIN 81 MG PO CHEW
324.0000 mg | CHEWABLE_TABLET | Freq: Once | ORAL | Status: AC
Start: 2015-11-18 — End: 2015-11-18
  Administered 2015-11-18: 324 mg via ORAL
  Filled 2015-11-18: qty 4

## 2015-11-18 MED ORDER — CLONIDINE HCL 0.1 MG PO TABS
0.3000 mg | ORAL_TABLET | Freq: Once | ORAL | Status: AC
  Administered 2015-11-18: 0.3 mg via ORAL
  Filled 2015-11-18: qty 3

## 2015-11-18 MED ORDER — INSULIN ASPART 100 UNIT/ML ~~LOC~~ SOLN
0.0000 [IU] | Freq: Every day | SUBCUTANEOUS | Status: DC
Start: 2015-11-18 — End: 2015-11-20
  Administered 2015-11-18 – 2015-11-19 (×2): 2 [IU] via SUBCUTANEOUS

## 2015-11-18 MED ORDER — WARFARIN SODIUM 7.5 MG PO TABS
7.5000 mg | ORAL_TABLET | Freq: Once | ORAL | Status: AC
  Administered 2015-11-19: 7.5 mg via ORAL
  Filled 2015-11-18 (×2): qty 1

## 2015-11-18 MED ORDER — STROKE: EARLY STAGES OF RECOVERY BOOK
Freq: Once | Status: DC
  Filled 2015-11-18: qty 1

## 2015-11-18 MED ORDER — FUROSEMIDE 40 MG PO TABS: 20.0000 mg | ORAL_TABLET | Freq: Every day | ORAL | Status: DC

## 2015-11-18 MED ORDER — CLONIDINE HCL 0.1 MG PO TABS
0.3000 mg | ORAL_TABLET | Freq: Three times a day (TID) | ORAL | Status: DC
  Administered 2015-11-18 – 2015-11-21 (×10): 0.3 mg via ORAL
  Filled 2015-11-18 (×10): qty 3

## 2015-11-18 MED ORDER — NICARDIPINE HCL IN NACL 20-0.86 MG/200ML-% IV SOLN
3.0000 mg/h | Freq: Once | INTRAVENOUS | Status: AC
  Administered 2015-11-18: 5 mg/h via INTRAVENOUS
  Filled 2015-11-18: qty 200

## 2015-11-18 MED ORDER — NICARDIPINE HCL IN NACL 20-0.86 MG/200ML-% IV SOLN
3.0000 mg/h | INTRAVENOUS | Status: DC
  Administered 2015-11-18: 7.5 mg/h via INTRAVENOUS
  Administered 2015-11-18: 5 mg/h via INTRAVENOUS
  Administered 2015-11-18: 7.5 mg/h via INTRAVENOUS
  Administered 2015-11-19 (×3): 10 mg/h via INTRAVENOUS
  Administered 2015-11-19: 7.5 mg/h via INTRAVENOUS
  Filled 2015-11-18 (×6): qty 200

## 2015-11-18 MED ORDER — LACTATED RINGERS IV SOLN
INTRAVENOUS | Status: DC
  Administered 2015-11-18: 125 mL/h via INTRAVENOUS

## 2015-11-18 MED ORDER — PRAVASTATIN SODIUM 40 MG PO TABS
80.0000 mg | ORAL_TABLET | Freq: Every day | ORAL | Status: DC
  Administered 2015-11-18 – 2015-11-20 (×3): 80 mg via ORAL
  Filled 2015-11-18: qty 2
  Filled 2015-11-18 (×2): qty 1

## 2015-11-18 NOTE — ED Notes (Addendum)
Attempt to give report to floor x3 for past 20 mins. Secretary reports nurse will call back to ED.

## 2015-11-18 NOTE — ED Notes (Signed)
Per [name] leave cardene drip at current infusion rate and given catapres as ordered.

## 2015-11-18 NOTE — H&P (Addendum)
History and Physical    Patrick [name] TZG:[id number] DOB: 1946/01/18 DOA: 11/18/2015  Referring Provider: Brantley Stage PCP: Jacques Earthly, MD IM teaching service Outpatient Specialists:   Patient coming from: home  Chief Complaint: headache  HPI: Patrick [name] is a 70 y.o. male with medical history significant of HTN, CKD 3-4, a-fib on Coumadin, CVA, DM2 presenting for Headache for 1 wk and vomiting for a few days. He has also noted weakness in both legs. In ER his BP is in 759 systolic. He states he has been taking his medications appropriately but was not able to take them this AM due to vomiting.no reflux and no diarrhea. No fevers.    He was seen on 4/18 by Dr Eppie Gibson who notes non-compliance with meds and HTN likely secondary to Clonidine withdrawal/ rebound. CT head was negative. Headache has been on the top of his head and frontal area. No sinus issues. No visual changes or numbness or tingling.   ED Course: On my exam he has a right facial droop. Head CT negative. MRI pending. Nicardipine started in ER.    Review of Systems:  Has occasional pedal edema.  Has had some lower back pain lately.  All other systems reviewed and apart from HPI, are negative.  Past Medical History  Diagnosis Date  . Hypertension   . CKD (chronic kidney disease), stage II   . CAD (coronary artery disease)   . Depression   . Gout   . Hyperlipidemia   . 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 GERD (gastroesophageal reflux disease)   . Diabetes mellitus type II     Insulin dependent  . Upper GI bleeding 07/01/2013  . Paroxysmal atrial fibrillation (HCC)     on coumadin  . Atrial fibrillation ([place])   . Myocardial infarction [facility]) 2000's    "near heart attack" (07/14/2013)  . Shortness of breath     "can happen at any time" (07/14/2013)  . Sleep apnea 07/2010    "has mask at home; seldom uses it" (07/14/2013)  . Stomach ulcer 1950's    "as a  teenager"  . Stroke [facility]) ~ 2007; ~ 2009    "memory not as good since" (07/14/2013)  . Permanent atrial fibrillation ([place]) 07/17/2013  . Chronic anticoagulation, on coumadin 07/17/2013  . Tachy-brady syndrome ([place]) 07/17/2013    Past Surgical History  Procedure Laterality Date  . Cardiac catheterization  07/2003    /encounter notes 08/27/2005 (07/01/2013)     Social history: reports that he quit smoking about 39 years ago. His smoking use included Cigarettes. He has a 7.5 pack-year smoking history. He has quit using smokeless tobacco. He reports that he does not drinks alcohol. He reports that he does not use illicit drugs.  No Known Allergies  History reviewed. No pertinent family history.   Prior to Admission medications   Medication Sig Start Date End Date Taking? Authorizing Provider  allopurinol (ZYLOPRIM) 100 MG tablet TAKE TWO TABLETS BY MOUTH ONCE DAILY 11/02/15  Yes Milagros Loll, MD  aspirin 81 MG EC tablet Take 81 mg by mouth daily.     Yes Historical Provider, MD  cloNIDine (CATAPRES) 0.3 MG tablet Take 1 tablet (0.3 mg total) by mouth 3 (three) times daily. 09/27/15 09/26/16 Yes Corky Sox, MD  diltiazem (CARDIZEM) 30 MG tablet Take 30 mg by mouth 3 (three) times daily.   Yes Historical Provider, MD  furosemide (LASIX) 20 MG  tablet Take 1 tablet (20 mg total) by mouth 2 (two) times daily. Patient taking differently: Take 20 mg by mouth daily.  09/26/15  Yes Corky Sox, MD  lisinopril (PRINIVIL,ZESTRIL) 10 MG tablet Take 1 tablet (10 mg total) by mouth daily. 08/29/15  Yes Milagros Loll, MD  metFORMIN (GLUCOPHAGE-XR) 500 MG 24 hr tablet TAKE 2 TABLETS BY MOUTH WITH BREAKFAST 11/17/15  Yes Milagros Loll, MD  pravastatin (PRAVACHOL) 80 MG tablet Take 1 tablet (80 mg total) by mouth daily. 08/29/15  Yes Milagros Loll, MD  warfarin (COUMADIN) 5 MG tablet Take 1 tablet (5 mg total) by mouth daily at 6 PM. 08/29/15  Yes Milagros Loll, MD  Blood Glucose Monitoring Suppl  (ACCU-CHEK AVIVA PLUS) W/DEVICE KIT 1 each by Does not apply route as needed. 10/26/10   Trish Fountain, MD  diltiazem (DILACOR XR) 120 MG 24 hr capsule Take 1 capsule (120 mg total) by mouth daily. 09/27/15   Corky Sox, MD  glucose blood (TRUETRACK TEST) test strip Use to test blood sugar 2-3 times daily dx code 250.00 10/11/14   [name], MD  Lancets 30G MISC Use to test blood glucose 2-3 times daily dx code 250.00 01/16/13   Charlann Lange, MD    Physical Exam: Filed Vitals:   11/18/15 1608 11/18/15 1624 11/18/15 1627 11/18/15 1640  BP: 208/138 192/104 192/106 184/100  Pulse: 94 99    Temp: 98.5 F (36.9 C)     TempSrc: Oral     Resp: 14 20    SpO2: 99% 95%        Constitutional: NAD, calm, comfortable Eyes: PERTLA, lids and conjunctivae normal ENMT: Mucous membranes are moist. Posterior pharynx clear of any exudate or lesions. Normal dentition.  Neck: normal, supple, no masses, no thyromegaly Respiratory: clear to auscultation bilaterally, no wheezing, no crackles. Normal respiratory effort. No accessory muscle use.  Cardiovascular: S1 & S2 heard, regular rate and rhythm, no murmurs / rubs / gallops. No extremity edema. 2+ pedal pulses. No carotid bruits.  Abdomen: No distension, no tenderness, no masses palpated. No hepatosplenomegaly. Bowel sounds normal.  Musculoskeletal: no clubbing / cyanosis. No joint deformity upper and lower extremities. Good ROM, no contractures. Normal muscle tone.  Skin: no rashes, lesions, ulcers. No induration Neurologic: right facial droop. Sensation intact, DTR normal. Strength 5/5 in all 4 limbs.  Psychiatric: Normal judgment and insight. Alert and oriented x 3. Normal mood.     Labs on Admission: I have personally reviewed following labs and imaging studies  CBC:  Recent Labs Lab 11/15/15 1143 11/18/15 1359  WBC 5.6 7.1  NEUTROABS  --  5.9  HGB  --  12.9*  HCT 39.2 40.6  MCV 79 80.1  PLT 196 161   Basic Metabolic  Panel:  Recent Labs Lab 11/15/15 1143 11/18/15 1359  NA 143 145  K 4.1 3.7  CL 98 105  CO2 23 20*  GLUCOSE 154* 251*  BUN 38* 41*  CREATININE 2.50* 2.97*  CALCIUM 9.2 9.8   GFR: Estimated Creatinine Clearance: 23.8 mL/min (by C-G formula based on Cr of 2.97). Liver Function Tests:  Recent Labs Lab 11/15/15 1143 11/18/15 1359  AST 13 23  ALT 11 13*  ALKPHOS 85 76  BILITOT 0.6 1.0  PROT 7.1 7.8  ALBUMIN 4.3 4.4   No results for input(s): LIPASE, AMYLASE in the last 168 hours. No results for input(s): AMMONIA in the last 168 hours. Coagulation Profile:  Recent Labs  Lab 11/14/15 1216 11/18/15 1359  INR 3.00 1.97*   Cardiac Enzymes:  Recent Labs Lab 11/15/15 1143  CKTOTAL 83   BNP (last 3 results) No results for input(s): PROBNP in the last 8760 hours. HbA1C: No results for input(s): HGBA1C in the last 72 hours. CBG:  Recent Labs Lab 11/15/15 1148 11/18/15 1409  GLUCAP 132* 238*   Lipid Profile: No results for input(s): CHOL, HDL, LDLCALC, TRIG, CHOLHDL, LDLDIRECT in the last 72 hours. Thyroid Function Tests: No results for input(s): TSH, T4TOTAL, FREET4, T3FREE, THYROIDAB in the last 72 hours. Anemia Panel: No results for input(s): VITAMINB12, FOLATE, FERRITIN, TIBC, IRON, RETICCTPCT in the last 72 hours. Urine analysis:    Component Value Date/Time   COLORURINE YELLOW 10/12/2013 1032   APPEARANCEUR CLEAR 10/12/2013 1032   LABSPEC 1.016 10/12/2013 1032   PHURINE 5.5 10/12/2013 1032   GLUCOSEU NEG 10/12/2013 1032   HGBUR NEG 10/12/2013 1032   BILIRUBINUR negative 08/29/2015 1516   BILIRUBINUR NEG 10/12/2013 1032   KETONESUR NEG 10/12/2013 1032   PROTEINUR 100 mg/dl 08/29/2015 1516   PROTEINUR 100* 10/12/2013 1032   UROBILINOGEN 0.2 08/29/2015 1516   UROBILINOGEN 0.2 10/12/2013 1032   NITRITE negative 08/29/2015 1516   NITRITE NEG 10/12/2013 1032   LEUKOCYTESUR Negative 08/29/2015 1516   Sepsis  Labs: _0 (procalcitonin:4,lacticidven:4) )No results found for this or any previous visit (from the past 240 hour(s)).   Radiological Exams on Admission: Ct Head Wo Contrast  11/18/2015  CLINICAL DATA:  "Per pt, states he started having a severe headache causing emesis yesterday-states he is unable to move both legs-states history of stroke" EXAM: CT HEAD WITHOUT CONTRAST TECHNIQUE: Contiguous axial images were obtained from the base of the skull through the vertex without intravenous contrast. COMPARISON:  11/15/2015 FINDINGS: There is central and cortical atrophy. Periventricular white matter changes are consistent with small vessel disease. Prominent CSF spaces again noted along the convexities appear stable. There is no intra or extra-axial fluid collection or mass lesion. The basilar cisterns and ventricles have a normal appearance. There is no CT evidence for acute infarction or hemorrhage. Remote lacunar infarct is identified within the left thalamus. Bone windows are unremarkable. There is atherosclerotic calcification both internal carotid arteries. IMPRESSION: 1. Stable, prominent CSF spaces bilaterally at the convexities. 2. Chronic lacunar infarct of the left thalamus. 3.  No evidence for acute intracranial abnormality. Electronically Signed   By: Nolon Nations M.D.   On: 11/18/2015 14:45    EKG: Independently reviewed. 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 - stop Metformin- baseline Cr too high for this - ISS, A1c (last 7.7 in 1/17)    Chronic kidney disease (CKD), stage  III - IV - follow  Grade 3 diastolic dysfunction  - per last ECHO in 12/14 - cont Lasix, Lisinopril and Cardizem - follow I and O and daily weights    Permanent atrial fibrillation  - RVR / SVT on admission with rate in 160s - rate now in 80s- can cont Cardizem and try to wean off Nicardipine  - INR 1.9- pharmacy to manage Coumadin  DVT prophylaxis: Coumadin Code Status: DNR- discussed with patient  Family Communication:   Disposition Plan: transfer to [facility] ICU (due to Nicardipine infusion) when bed available - if he is weaned off of the Nicardipine and moved out of ICU, he needs to go to the Internal Medicine Teaching Service   Consults called: ER spoke with Neuro Dr Silverio Decamp- need to call Neuro when patient arrives to [facility]  Admission status: Antony Salmon MD [facility] Pager: [email or web address] Password [email or web address] 7PM-7AM, please contact night-coverage   11/18/2015, 5:11 PM

## 2015-11-18 NOTE — Progress Notes (Signed)
ANTICOAGULATION CONSULT NOTE - Initial Consult  Pharmacy Consult for Warfarin Indication: atrial fibrillation  No Known Allergies  Patient Measurements:    Vital Signs: Temp: 98.2 F (36.8 C) (04/21 1806) Temp Source: Oral (04/21 1806) BP: 177/108 mmHg (04/21 1823) Pulse Rate: 107 (04/21 1823)  Labs:  Recent Labs  11/18/15 1359  HGB 12.9*  HCT 40.6  PLT 241  APTT 35  LABPROT 21.6*  INR 1.97*  CREATININE 2.97*    Estimated Creatinine Clearance: 23.8 mL/min (by C-G formula based on Cr of 2.97).   Medical History: Past Medical History  Diagnosis Date  . Hypertension   . CKD (chronic kidney disease), stage II   . CAD (coronary artery disease)   . Depression   . Gout   . Hyperlipidemia   . 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 GERD (gastroesophageal reflux disease)   . Diabetes mellitus type II     Insulin dependent  . Upper GI bleeding 07/01/2013  . Paroxysmal atrial fibrillation (HCC)     on coumadin  . Atrial fibrillation ([place])   . Myocardial infarction [facility]) 2000's    "near heart attack" (07/14/2013)  . Shortness of breath     "can happen at any time" (07/14/2013)  . Sleep apnea 07/2010    "has mask at home; seldom uses it" (07/14/2013)  . Stomach ulcer 1950's    "as a teenager"  . Stroke [facility]) ~ 2007; ~ 2009    "memory not as good since" (07/14/2013)  . Permanent atrial fibrillation ([place]) 07/17/2013  . Chronic anticoagulation, on coumadin 07/17/2013  . Tachy-brady syndrome ([place]) 07/17/2013    Medications:   (Not in a hospital admission) Scheduled:  .  stroke: mapping our early stages of recovery book   Does not apply Once  . allopurinol  200 mg Oral Daily  . aspirin EC  81 mg Oral Daily  . cloNIDine  0.3 mg Oral TID  . diltiazem  120 mg Oral Daily  . furosemide  20 mg Oral Daily  . insulin aspart  0-5 Units Subcutaneous QHS  . [START ON 11/19/2015] insulin aspart  0-9 Units Subcutaneous TID  WC  . lisinopril  10 mg Oral Daily  . pravastatin  80 mg Oral Daily  . warfarin  7.5 mg Oral Once  . Warfarin - Pharmacist Dosing Inpatient   Does not apply q1800    Assessment: 66 yoM presented to ED on 4/21 with severe headache and vomiting; admitting MD notes HTN emergency with facial droop and possible CVA.  CT negative, MRI pending.  PMH includes HTN, CKD, DM2, stroke, atrial fibrillation, and warfarin anticoagulation.  Home warfarin dose is reported as 5mg  daily (last dose on 4/20 PM).  Admission INR 1.97 just subtherapeutic.  Pharmacy is consulted to dose warfarin.  Today, 11/18/2015: INR 1.97 CBC Hgb 12.9, Plt 241 Diet: NPO Drug-drug interactions: Allopurinol, Aspirin 325mg  4/21 then 81mg  daily  Goal of Therapy:  INR 2-3 Monitor platelets by anticoagulation protocol: Yes   Plan:   Warfarin 7.5 mg PO x 1 dose tonight.  Daily PT/INR.  Monitor for signs and symptoms of bleeding.  Gretta Arab PharmD, BCPS Pager [phone] 11/18/2015 6:41 PM

## 2015-11-18 NOTE — ED Notes (Signed)
Per pt, states he started having a severe headache causing emesis yesterday-states he is unable to move both legs-states history of stroke

## 2015-11-18 NOTE — ED Provider Notes (Signed)
CSN: [id number]     Arrival date & time 11/18/15  1335 History   First MD Initiated Contact with Patient 11/18/15 1354     Chief Complaint  Patient presents with  . Headache     (Consider location/radiation/quality/duration/timing/severity/associated sxs/prior Treatment) HPI 70 year old male who presents with headache. He has a history of hypertension, hyperlipidemia, diabetes, CKD, CAD, atrial fibrillation on Coumadin, and prior stroke with residual memory loss and slowing of his speech. History is provided by the patient and his son. States that he had onset of diffuse headache 1 week ago. It was associated with some gait instability as well as bilateral lower extremity weakness, although he thinks that it may be slightly worse on the right compared to the left. Yesterday began to have nausea and vomiting. Brought to the ED today for evaluation by his son. No chest pain, back pain, abdominal pain, flank pain, difficulty breathing. He has not had increased lower extremity edema, vision changes. Family does think his speech is mildly slurred. He denies any numbness, diarrhea, fevers or chills recently.   Past Medical History  Diagnosis Date  . Hypertension   . CKD (chronic kidney disease), stage II   . CAD (coronary artery disease)   . Depression   . Gout   . Hyperlipidemia   . 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 GERD (gastroesophageal reflux disease)   . Diabetes mellitus type II     Insulin dependent  . Upper GI bleeding 07/01/2013  . Paroxysmal atrial fibrillation (HCC)     on coumadin  . Atrial fibrillation ([place])   . Myocardial infarction [facility]) 2000's    "near heart attack" (07/14/2013)  . Shortness of breath     "can happen at any time" (07/14/2013)  . Sleep apnea 07/2010    "has mask at home; seldom uses it" (07/14/2013)  . Stomach ulcer 1950's    "as a teenager"  . Stroke [facility]) ~ 2007; ~ 2009    "memory not as good  since" (07/14/2013)  . Permanent atrial fibrillation ([place]) 07/17/2013  . Chronic anticoagulation, on coumadin 07/17/2013  . Tachy-brady syndrome ([place]) 07/17/2013   Past Surgical History  Procedure Laterality Date  . Cardiac catheterization  07/2003    /encounter notes 08/27/2005 (07/01/2013)   History reviewed. No pertinent family history. Social History  Substance Use Topics  . Smoking status: Former Smoker -- 0.50 packs/day for 15 years    Types: Cigarettes    Quit date: 10/26/1976  . Smokeless tobacco: Former Systems developer     Comment: 07/14/2013 "quit chewing and dipping in ~ 1970"  . Alcohol Use: 0.0 oz/week    0 Standard drinks or equivalent per week     Comment: 07/14/2013 "drank a little; quit in ~ 1970; never had problem w/it"    Review of Systems 10/14 systems reviewed and are negative other than those stated in the HPI    Allergies  Review of patient's allergies indicates no known allergies.  Home Medications   Prior to Admission medications   Medication Sig Start Date End Date Taking? Authorizing Provider  allopurinol (ZYLOPRIM) 100 MG tablet TAKE TWO TABLETS BY MOUTH ONCE DAILY 11/02/15  Yes Milagros Loll, MD  aspirin 81 MG EC tablet Take 81 mg by mouth daily.     Yes Historical Provider, MD  cloNIDine (CATAPRES) 0.3 MG tablet Take 1 tablet (0.3 mg total) by mouth 3 (three) times daily. 09/27/15 09/26/16 Yes  Eden W Jones, MD  diltiazem (CARDIZEM) 30 MG tablet Take 30 mg by mouth 3 (three) times daily.   Yes Historical Provider, MD  furosemide (LASIX) 20 MG tablet Take 1 tablet (20 mg total) by mouth 2 (two) times daily. Patient taking differently: Take 20 mg by mouth daily.  09/26/15  Yes Eden W Jones, MD  lisinopril (PRINIVIL,ZESTRIL) 10 MG tablet Take 1 tablet (10 mg total) by mouth daily. 08/29/15  Yes Jennifer T Krall, MD  metFORMIN (GLUCOPHAGE-XR) 500 MG 24 hr tablet TAKE 2 TABLETS BY MOUTH WITH BREAKFAST 11/17/15  Yes Jennifer T Krall, MD  pravastatin (PRAVACHOL) 80 MG  tablet Take 1 tablet (80 mg total) by mouth daily. 08/29/15  Yes Jennifer T Krall, MD  warfarin (COUMADIN) 5 MG tablet Take 1 tablet (5 mg total) by mouth daily at 6 PM. 08/29/15  Yes Jennifer T Krall, MD  Blood Glucose Monitoring Suppl (ACCU-CHEK AVIVA PLUS) W/DEVICE KIT 1 each by Does not apply route as needed. 10/26/10   [name] Pribula, MD  diltiazem (DILACOR XR) 120 MG 24 hr capsule Take 1 capsule (120 mg total) by mouth daily. 09/27/15   Eden W Jones, MD  glucose blood (TRUETRACK TEST) test strip Use to test blood sugar 2-3 times daily dx code 250.00 10/11/14   Shilpa Bhardwaj, MD  Lancets 30G MISC Use to test blood glucose 2-3 times daily dx code 250.00 01/16/13   Na Li, MD   BP 184/100 mmHg  Pulse 99  Temp(Src) 98.5 F (36.9 C) (Oral)  Resp 20  SpO2 95% Physical Exam Physical Exam  Nursing note and vitals reviewed. Constitutional: Non-toxic, and in no acute distress Head: Normocephalic and atraumatic.  Mouth/Throat: Oropharynx is clear and moist.  Neck: Normal range of motion. Neck supple.  Cardiovascular: Normal rate and regular rhythm.  +2 DP and radial pulses.  Pulmonary/Chest: Effort normal and breath sounds normal.  Abdominal: Soft. There is no tenderness. There is no rebound and no guarding.  Musculoskeletal: Normal range of motion.  Skin: Skin is warm and dry.  Psychiatric: Cooperative Neurological:  Alert, oriented to person, place, time, and situation. Memory grossly in tact. Fluent speech. No dysarthria or aphasia.  Cranial nerves: VF are full. Fundoscopic exam-unable to get good visualization of the discs. Pupils are symmetric, and reactive to light. EOMI without nystagmus. No gaze deviation. Facial muscles full activation but with ? Of possible droop at the lip on the right side. Sensation to light touch over face in tact bilaterally. Hearing grossly in tact. Palate elevates symmetrically. Head turn and shoulder shrug are intact. Tongue midline.  Reflexes defered.   Muscle bulk and tone normal. No pronator drift. Moves all extremities symmetrically. Sensation to light touch is in tact throughout in bilateral upper and lower extremities. Coordination reveals no dysmetria with finger to nose.   ED Course  Procedures (including critical care time) Labs Review Labs Reviewed  PROTIME-INR - Abnormal; Notable for the following:    Prothrombin Time 21.6 (*)    INR 1.97 (*)    All other components within normal limits  CBC - Abnormal; Notable for the following:    Hemoglobin 12.9 (*)    MCH 25.4 (*)    All other components within normal limits  COMPREHENSIVE METABOLIC PANEL - Abnormal; Notable for the following:    CO2 20 (*)    Glucose, Bld 251 (*)    BUN 41 (*)    Creatinine, Ser 2.97 (*)    ALT 13 (*)      GFR calc non Af Amer 20 (*)    GFR calc Af Amer 24 (*)    Anion gap 20 (*)    All other components within normal limits  I-STAT TROPOININ, ED - Abnormal; Notable for the following:    Troponin i, poc 0.17 (*)    All other components within normal limits  CBG MONITORING, ED - Abnormal; Notable for the following:    Glucose-Capillary 238 (*)    All other components within normal limits  APTT  DIFFERENTIAL  URINALYSIS, ROUTINE W REFLEX MICROSCOPIC (NOT AT [facility])  HEMOGLOBIN A1C  LIPID PANEL  BASIC METABOLIC PANEL    Imaging Review Ct Head Wo Contrast  11/18/2015  CLINICAL DATA:  "Per pt, states he started having a severe headache causing emesis yesterday-states he is unable to move both legs-states history of stroke" EXAM: CT HEAD WITHOUT CONTRAST TECHNIQUE: Contiguous axial images were obtained from the base of the skull through the vertex without intravenous contrast. COMPARISON:  11/15/2015 FINDINGS: There is central and cortical atrophy. Periventricular white matter changes are consistent with small vessel disease. Prominent CSF spaces again noted along the convexities appear stable. There is no intra or extra-axial fluid collection or  mass lesion. The basilar cisterns and ventricles have a normal appearance. There is no CT evidence for acute infarction or hemorrhage. Remote lacunar infarct is identified within the left thalamus. Bone windows are unremarkable. There is atherosclerotic calcification both internal carotid arteries. IMPRESSION: 1. Stable, prominent CSF spaces bilaterally at the convexities. 2. Chronic lacunar infarct of the left thalamus. 3.  No evidence for acute intracranial abnormality. Electronically Signed   By: Elizabeth  Brown M.D.   On: 11/18/2015 14:45   I have personally reviewed and evaluated these images and lab results as part of my medical decision-making.   EKG Interpretation None      CRITICAL CARE Performed by: Irl Bodie Duo Juniel Groene   Total critical care time: 35 minutes  Critical care time was exclusive of separately billable procedures and treating other patients.  Critical care was necessary to treat or prevent imminent or life-threatening deterioration.  Critical care was time spent personally by me on the following activities: development of treatment plan with patient and/or surrogate as well as nursing, discussions with consultants, evaluation of patient's response to treatment, examination of patient, obtaining history from patient or surrogate, ordering and performing treatments and interventions, ordering and review of laboratory studies, ordering and review of radiographic studies, pulse oximetry and re-evaluation of patient's condition.   MDM   Final diagnoses:  Hypertensive encephalopathy    69-year-old male with history of hypertension, CAD, diabetes, and prior CVA who presents with headache, lower extremity weakness, and gait instability. On presentation is nontoxic in no acute distress. He is hypertensive with systolic blood pressures in the 230-250s/120-160s. He is noted to be atrophic fibrillation with RVR at a heart rate of 120-160. States compliance with antihypertensive  medications at home. His neurological exam does not reveal significant focal deficits, question of possible droop involving the right lip. CT head does not reveal any intracranial hemorrhage. Suspect hypertensive encephalopathy as etiology of his symptoms. Will perform MRI to rule out stroke. Given 20 mg of labetalol, and heart rate subsequently in the 90s to low 100s. Still persistently hypertensive and subsequently place and on a nicardipine drip with a goal SBP of around 180/90-100. Discussed with Dr. Nandigam from neurology, suspecting hypertensive encephalopathy but agreed with MRI. Recommending transfer to [place] for neurology evaluation. Blood work also   notable for a troponin leak at 0.17, which I suspect is more from demand due to his hypertension. He is not having any chest pain, back pain, shortness of breath or any other symptoms concerning for ACS or dissection. Near baseline renal function. He has subtherapeutic INR. Discussed with internal medicine service, who will admit to stepdown at [facility] under Dr. Eppie Gibson.   Forde Dandy, MD 11/18/15 [phone]

## 2015-11-18 NOTE — ED Notes (Signed)
Pt transported to MRI 

## 2015-11-18 NOTE — ED Notes (Signed)
[facility] called at 17:39/

## 2015-11-18 NOTE — ED Notes (Signed)
Bed: RESA Expected date:  Expected time:  Means of arrival:  Comments: Hold for triage 3

## 2015-11-18 NOTE — Progress Notes (Signed)
ANTICOAGULATION CONSULT NOTE - Initial Consult  Pharmacy Consult for Warfarin Indication: atrial fibrillation  No Known Allergies  Patient Measurements:    Vital Signs: Temp: 98.5 F (36.9 C) (04/21 1608) Temp Source: Oral (04/21 1608) BP: 184/100 mmHg (04/21 1640) Pulse Rate: 99 (04/21 1624)  Labs:  Recent Labs  11/18/15 1359  HGB 12.9*  HCT 40.6  PLT 241  APTT 35  LABPROT 21.6*  INR 1.97*  CREATININE 2.97*    Estimated Creatinine Clearance: 23.8 mL/min (by C-G formula based on Cr of 2.97).   Medical History: Past Medical History  Diagnosis Date  . Hypertension   . CKD (chronic kidney disease), stage II   . CAD (coronary artery disease)   . Depression   . Gout   . Hyperlipidemia   . 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 GERD (gastroesophageal reflux disease)   . Diabetes mellitus type II     Insulin dependent  . Upper GI bleeding 07/01/2013  . Paroxysmal atrial fibrillation (HCC)     on coumadin  . Atrial fibrillation ([place])   . Myocardial infarction [facility]) 2000's    "near heart attack" (07/14/2013)  . Shortness of breath     "can happen at any time" (07/14/2013)  . Sleep apnea 07/2010    "has mask at home; seldom uses it" (07/14/2013)  . Stomach ulcer 1950's    "as a teenager"  . Stroke [facility]) ~ 2007; ~ 2009    "memory not as good since" (07/14/2013)  . Permanent atrial fibrillation ([place]) 07/17/2013  . Chronic anticoagulation, on coumadin 07/17/2013  . Tachy-brady syndrome ([place]) 07/17/2013    Medications:   (Not in a hospital admission) Scheduled:  .  stroke: mapping our early stages of recovery book   Does not apply Once  . allopurinol  200 mg Oral Daily  . aspirin EC  81 mg Oral Daily  . cloNIDine  0.3 mg Oral TID  . diltiazem  120 mg Oral Daily  . furosemide  20 mg Oral Daily  . insulin aspart  0-5 Units Subcutaneous QHS  . [START ON 11/19/2015] insulin aspart  0-9 Units Subcutaneous TID  WC  . lisinopril  10 mg Oral Daily  . pravastatin  80 mg Oral Daily    Assessment: 39 yoM presented to ED on 4/21 with severe headache and vomiting; admitting MD notes HTN emergency with facial droop and possible CVA.  CT negative, MRI pending.  PMH includes HTN, CKD, DM2, stroke, atrial fibrillation, and warfarin anticoagulation.  Home warfarin dose is reported as 5mg  daily (last dose on 4/20 PM).  Admission INR 1.97 just subtherapeutic.  Pharmacy is consulted to dose warfarin.  Today, 11/18/2015: INR 1.97 CBC Hgb 12.9, Plt 241 Diet: NPO Drug-drug interactions: Aspirin 325mg  4/21 then 81mg  daily  Goal of Therapy:  INR 2-3 Monitor platelets by anticoagulation protocol: Yes   Plan:   Warfarin 7.5 mg PO x 1 dose tonight.  Daily PT/INR.  Monitor for signs and symptoms of bleeding.  Gretta Arab PharmD, BCPS Pager [phone] 11/18/2015 5:55 PM

## 2015-11-18 NOTE — Consult Note (Signed)
Neurology Consultation Reason for Consult:  Referring Physician: Dr Diona Fanti  CC: AMS  History is obtained from: patient, son and chart  HPI: Patrick [name] is a 70 y.o. male with hx of HTN on catapres patch.  He has not been using the patch lately because he cannot afford it.  In thsi setting went to ED a couple of days ago with headache and was worked up and sent home. He then developed a headache and came back to OSH ED where he was again found to be extremily hypertensive.  The is a questionable report of there being minor right sided weakness but pateint denies this and tells me that he noramally has left leg pain.  His legs were weak prior to coming to the hospital but this was a bialteral issue.  HE was transferred to [facility] with concerns for PRES. However his MRI was negative.  ROS: A 14 point ROS was performed and is negative except as noted in the HPI.   Past Medical History  Diagnosis Date  . Hypertension   . CKD (chronic kidney disease), stage II   . CAD (coronary artery disease)   . Depression   . Gout   . Hyperlipidemia   . 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 GERD (gastroesophageal reflux disease)   . Diabetes mellitus type II     Insulin dependent  . Upper GI bleeding 07/01/2013  . Paroxysmal atrial fibrillation (HCC)     on coumadin  . Atrial fibrillation ([place])   . Myocardial infarction [facility]) 2000's    "near heart attack" (07/14/2013)  . Shortness of breath     "can happen at any time" (07/14/2013)  . Sleep apnea 07/2010    "has mask at home; seldom uses it" (07/14/2013)  . Stomach ulcer 1950's    "as a teenager"  . Stroke [facility]) ~ 2007; ~ 2009    "memory not as good since" (07/14/2013)  . Permanent atrial fibrillation ([place]) 07/17/2013  . Chronic anticoagulation, on coumadin 07/17/2013  . Tachy-brady syndrome ([place]) 07/17/2013    History reviewed. No pertinent family history.  Social History:  reports that he  quit smoking about 39 years ago. His smoking use included Cigarettes. He has a 7.5 pack-year smoking history. He has quit using smokeless tobacco. He reports that he drinks alcohol. He reports that he does not use illicit drugs.  Exam: Current vital signs: BP 174/92 mmHg  Pulse 91  Temp(Src) 98.2 F (36.8 C) (Oral)  Resp 20  SpO2 96% Vital signs in last 24 hours: Temp:  [97.8 F (36.6 C)-98.5 F (36.9 C)] 98.2 F (36.8 C) (04/21 1943) Pulse Rate:  [47-129] 91 (04/21 2015) Resp:  [13-29] 20 (04/21 2103) BP: (139-270)/(89-168) 174/92 mmHg (04/21 2103) SpO2:  [93 %-100 %] 96 % (04/21 2103)   Physical Exam  Constitutional: Appears well-developed and well-nourished.  Psych: Affect appropriate to situation Eyes: No scleral injection HENT: No OP obstrucion Head: Normocephalic.  Cardiovascular: Normal rate and regular rhythm.  Respiratory: Effort normal and breath sounds normal to anterior ascultation GI: Soft.  No distension. There is no tenderness.  Skin: WDI  Neuro: Mental Status: Patient is awake, alert, oriented to person, place, month and situation Patient is able to give a clear and coherent history No signs of aphasia or neglect Cranial Nerves: II: Visual Fields are full. Pupils are equal, round, and reactive  III,IV, VI: EOMI without ptosis or diploplia.  V: Facial sensation is symmetric to temperature VII: Facial movement is symmetric.  VIII: hearing is intact to voice X: Uvula elevates symmetrically XI: Shoulder shrug is symmetric. XII: tongue is midline without atrophy or fasciculations.  Motor: Tone is normal. Bulk is normal. 5/5 strength was present in all four extremities.  Sensory: Sensation is symmetric to light touch and temperature in the arms and legs. Deep Tendon Reflexes: 2+ and symmetric in the biceps and patellae. Plantars: Toes are downgoing bilaterally. Cerebellar: FNF and HKS are intact bilaterally    I have reviewed labs in epic and the  results pertinent to this consultation are:  Acute on chronic ARF - INR 1.97  I have reviewed the images obtained:MIR negative for press or acute pathology  Impression: this pateint had a spell of accelerated hypertesion in the setting of clonidine patch non compliance.  He is technically maximally managed for stroke on warfarin.  I think it is reasonable to perform a carotid doppler and an echo to complete a TIA workup but if these tests are negative I would not necessarily change his management.  He will however need tight blood pressure control.  Stroke attending to take over after tomorrow  Recommendations: 1) as above

## 2015-11-18 NOTE — H&P (Addendum)
PULMONARY / CRITICAL CARE MEDICINE   Name: Patrick [name] MRN: [id number] DOB: 30-Apr-1946    ADMISSION DATE:  11/18/2015  CHIEF COMPLAINT:  Hypertensive emergency  INITIAL PRESENTATION:  70 y.o. male with past med hx HTN, CKD 3-4, a-fib on Coumadin, CVA, DM2 presenting for Headache for 1 wk and vomiting for a several days. He has also noted weakness in both legs. Upon arrival to the ED SBP was > 270 . He states he has been taking his medications appropriately but was not able to take them this AM due to vomiting.  He was seen on 4/18 by Dr Eppie Gibson who notes non-compliance with meds and HTN likely secondary to Clonidine withdrawal/ rebound. CT head was negative.   STUDIES:  MRI CT head  SIGNIFICANT EVENTS: Admitting hospitalist noted a right facial droop. Head CT negative. MRI without any acute changes but chronic lacunar infarcs and chronic ischemic changes of posterior R MCA.   Nicardipine gtt started at [facility] in the ED  Patient transferred to [place] :   has a past medical history of Hypertension; CKD (chronic kidney disease), stage II; CAD (coronary artery disease); Depression; Gout; Hyperlipidemia; CVA (cerebral infarction) (11/22/10); GERD (gastroesophageal reflux disease); Diabetes mellitus type II; Upper GI bleeding (07/01/2013); Paroxysmal atrial fibrillation ([place]); Atrial fibrillation ([place]); Myocardial infarction ([place]) (2000's); Shortness of breath; Sleep apnea (07/2010); Stomach ulcer (1950's); Stroke ([place]) (~ 2007; ~ 2009); Permanent atrial fibrillation ([place]) (07/17/2013); Chronic anticoagulation, on coumadin (07/17/2013); and Tachy-brady syndrome ([place]) (07/17/2013).  has past surgical history that includes Cardiac catheterization (07/2003). Prior to Admission medications   Medication Sig Start Date End Date Taking? Authorizing Provider  allopurinol (ZYLOPRIM) 100 MG tablet TAKE TWO TABLETS BY MOUTH ONCE DAILY 11/02/15  Yes Milagros Loll, MD  aspirin 81 MG EC  tablet Take 81 mg by mouth daily.     Yes Historical Provider, MD  cloNIDine (CATAPRES) 0.3 MG tablet Take 1 tablet (0.3 mg total) by mouth 3 (three) times daily. 09/27/15 09/26/16 Yes Corky Sox, MD  diltiazem (CARDIZEM) 30 MG tablet Take 30 mg by mouth 3 (three) times daily.   Yes Historical Provider, MD  furosemide (LASIX) 20 MG tablet Take 1 tablet (20 mg total) by mouth 2 (two) times daily. Patient taking differently: Take 20 mg by mouth daily.  09/26/15  Yes Corky Sox, MD  lisinopril (PRINIVIL,ZESTRIL) 10 MG tablet Take 1 tablet (10 mg total) by mouth daily. 08/29/15  Yes Milagros Loll, MD  metFORMIN (GLUCOPHAGE-XR) 500 MG 24 hr tablet TAKE 2 TABLETS BY MOUTH WITH BREAKFAST 11/17/15  Yes Milagros Loll, MD  pravastatin (PRAVACHOL) 80 MG tablet Take 1 tablet (80 mg total) by mouth daily. 08/29/15  Yes Milagros Loll, MD  warfarin (COUMADIN) 5 MG tablet Take 1 tablet (5 mg total) by mouth daily at 6 PM. 08/29/15  Yes Milagros Loll, MD  Blood Glucose Monitoring Suppl (ACCU-CHEK AVIVA PLUS) W/DEVICE KIT 1 each by Does not apply route as needed. 10/26/10   Trish Fountain, MD  diltiazem (DILACOR XR) 120 MG 24 hr capsule Take 1 capsule (120 mg total) by mouth daily. 09/27/15   Corky Sox, MD  glucose blood (TRUETRACK TEST) test strip Use to test blood sugar 2-3 times daily dx code 250.00 10/11/14   [name], MD  Lancets 30G MISC Use to test blood glucose 2-3 times daily dx code 250.00 01/16/13   Charlann Lange, MD   No Known Allergies  FAMILY  HISTORY:  indicated that his mother is deceased. He indicated that his father is deceased.  SOCIAL HISTORY:  reports that he quit smoking about 39 years ago. His smoking use included Cigarettes. He has a 7.5 pack-year smoking history. He has quit using smokeless tobacco. He reports that he drinks alcohol. He reports that he does not use illicit drugs.  REVIEW OF SYSTEMS:   Denies: fevers, chills, with changes, SOB, DOE, heat/cold intolerance,  rashes, visual changes Reports: leg numbness, headache nausea, vomiting  SUBJECTIVE:   VITAL SIGNS: Temp:  [97.8 F (36.6 C)-98.5 F (36.9 C)] 98.2 F (36.8 C) (04/21 1806) Pulse Rate:  [47-129] 99 (04/21 1930) Resp:  [13-29] 15 (04/21 1930) BP: (139-270)/(93-168) 139/93 mmHg (04/21 1930) SpO2:  [93 %-100 %] 95 % (04/21 1930) HEMODYNAMICS:   VENTILATOR SETTINGS:   INTAKE / OUTPUT: No intake or output data in the 24 hours ending 11/18/15 1933  PHYSICAL EXAMINATION: General:  NAD, AAOx3 Neuro:  CN II-XII intact.  Slow speech but this is old. HEENT:  Old right facial droop. NCAT Cardiovascular:  RRR, no m/r/g Lungs:  CTA b/l no w/r/r Abdomen: soft, nontender, normal bowel sounds Musculoskeletal:  Normal bulk and tone Skin:  No c/c/e  LABS:  CBC  Recent Labs Lab 11/15/15 1143 11/18/15 1359  WBC 5.6 7.1  HGB  --  12.9*  HCT 39.2 40.6  PLT 196 241   Coag's  Recent Labs Lab 11/14/15 1216 11/18/15 1359  APTT  --  35  INR 3.00 1.97*   BMET  Recent Labs Lab 11/15/15 1143 11/18/15 1359  NA 143 145  K 4.1 3.7  CL 98 105  CO2 23 20*  BUN 38* 41*  CREATININE 2.50* 2.97*  GLUCOSE 154* 251*   Electrolytes  Recent Labs Lab 11/15/15 1143 11/18/15 1359  CALCIUM 9.2 9.8   Sepsis Markers  Recent Labs Lab 11/15/15 1143  LATICACIDVEN 1.0   ABG No results for input(s): PHART, PCO2ART, PO2ART in the last 168 hours. Liver Enzymes  Recent Labs Lab 11/15/15 1143 11/18/15 1359  AST 13 23  ALT 11 13*  ALKPHOS 85 76  BILITOT 0.6 1.0  ALBUMIN 4.3 4.4   Cardiac Enzymes No results for input(s): TROPONINI, PROBNP in the last 168 hours. Glucose  Recent Labs Lab 11/15/15 1148 11/18/15 1409  GLUCAP 132* 238*    Imaging Ct Head Wo Contrast  11/18/2015  CLINICAL DATA:  "Per pt, states he started having a severe headache causing emesis yesterday-states he is unable to move both legs-states history of stroke" EXAM: CT HEAD WITHOUT CONTRAST TECHNIQUE:  Contiguous axial images were obtained from the base of the skull through the vertex without intravenous contrast. COMPARISON:  11/15/2015 FINDINGS: There is central and cortical atrophy. Periventricular white matter changes are consistent with small vessel disease. Prominent CSF spaces again noted along the convexities appear stable. There is no intra or extra-axial fluid collection or mass lesion. The basilar cisterns and ventricles have a normal appearance. There is no CT evidence for acute infarction or hemorrhage. Remote lacunar infarct is identified within the left thalamus. Bone windows are unremarkable. There is atherosclerotic calcification both internal carotid arteries. IMPRESSION: 1. Stable, prominent CSF spaces bilaterally at the convexities. 2. Chronic lacunar infarct of the left thalamus. 3.  No evidence for acute intracranial abnormality. Electronically Signed   By: Nolon Nations M.D.   On: 11/18/2015 14:45   Mr Brain Wo Contrast  11/18/2015  CLINICAL DATA:  70 year old male with headache and vomiting.  Bilateral lower extremity weakness. Gait instability. Hypertensive, 119 mmHg systolic. Initial encounter. EXAM: MRI HEAD WITHOUT CONTRAST TECHNIQUE: Multiplanar, multiecho pulse sequences of the brain and surrounding structures were obtained without intravenous contrast. COMPARISON:  Head CT without contrast 1431 hours today. Brain MRI 11/22/2010. FINDINGS: Major intracranial vascular flow voids are stable since 2012. No restricted diffusion or evidence of acute infarction. Chronic bilateral extra-axial CSF over both cerebral convexities has mildly increased since 2012, and appears mostly due to subarachnoid space CSF. However, there might be tiny associated subdural hygromas (series 10, image 16). Basilar cisterns remain patent. No significant associated intracranial mass effect. There are chronic lacunar infarcts in the left thalamus and right cerebellum. Chronic hemorrhage in the right  cerebellar tonsil is unchanged. Chronic micro hemorrhage in the lateral right occipital lobe is new since 2012. There is an associated nearby small area of cortical encephalomalacia in the posterior right temporal lobe best seen on series 7, image 16. Scattered small nonspecific cerebral white matter T2 and FLAIR hyperintense foci also have increased. No acute intracranial hemorrhage identified. No ventriculomegaly. Negative pituitary, cervicomedullary junction and visualized cervical spine. Visualized bone marrow signal is within normal limits. Visible internal auditory structures appear normal. Mastoids are clear. Chronic but improved mild ethmoid sinus mucosal thickening. Negative orbit and scalp soft tissues. IMPRESSION: 1.  No acute intracranial abnormality. 2. Chronic ischemic disease with mild progression in the posterior right MCA territory since 2012. 3. Chronic increased subarachnoid space CSF over both cerebral convexities. There may now be tiny superimposed subdural hygromas, but significance is doubtful. Electronically Signed   By: Genevie Ann M.D.   On: 11/18/2015 18:07     ASSESSMENT / PLAN: 70 yo male with Right facial droop TIA 2/2 hypertensive emergency/accelerated hypertension from clonidine withdrawal due to medication non-adhearance   PULMONARY A: Not acitive P:     CARDIOVASCULAR A:  Hypertensive emergency A-fib (CHADS2 > 2) On warfarin CAD Mild troponinemia - likley 2/2 hypertension P:  - Nicardipine gtt - goal SBP 170-200 for first 12 hrs - Restart clonidine - Continue diltiazem XR 183m Qday - INR subtherapeutic - continue warfarin - ASA 880mQday - trend troponins  RENAL A:   CKD 2 (Baseline Scr 1.8-2.5) Hypertensive proteinuria  Mild AKI - 2/2 Hypertensive nephropathy AGMA - unclear etiology.  Urin ketones negative.  Possible early DKA vs lactic acidosis  P:   - Control BP as above. - gentle fluid resuscitation given likely hypertensive diuresis over last  few days - hold lisinopril for now - Hold metformin - check serum lactate  GASTROINTESTINAL A:   GERD HLD  P:   - continue pravastatin  HEMATOLOGIC A:  P:  - check CBC  INFECTIOUS A:   Not active  P:    ENDOCRINE A:   Hyperglycemia - without DKA - no ketones (though may be early DKA given profound AG)   P:   - fluid resucitation - SSI - Finger sticks and SSI  NEUROLOGIC A:   TIA - 2/2 hypertension - now resolved P:   RASS goal: 0 - Appreciate neurology recs   DNR/DNI SQH Advance diet as tolerated  Total critical care time: 30 min  Critical care time was exclusive of separately billable procedures and treating other patients.  Critical care was necessary to treat or prevent imminent or life-threatening deterioration.  Critical care was time spent personally by me on the following activities: development of treatment plan with patient and/or surrogate as well as nursing, discussions with  consultants, evaluation of patient's response to treatment, examination of patient, obtaining history from patient or surrogate, ordering and performing treatments and interventions, ordering and review of laboratory studies, ordering and review of radiographic studies, pulse oximetry and re-evaluation of patient's condition.   Meribeth Mattes, DO., MS [place] [facility]   Pulmonary and Energy Pager: [phone]  11/18/2015, 7:33 PM

## 2015-11-18 NOTE — ED Notes (Signed)
[name] at bedside. 

## 2015-11-18 NOTE — ED Notes (Signed)
Per Rizwan placing orders of ICU bed related to cardene infusion.

## 2015-11-18 NOTE — ED Notes (Signed)
Pt denies headache.

## 2015-11-18 NOTE — ED Notes (Signed)
MD [name] aware of troponin result

## 2015-11-18 NOTE — Progress Notes (Signed)
[place] Progress Note Patient Name: Patrick [name] DOB: [birth year] MRN: [id number]   Date of Service  11/18/2015  HPI/Events of Note  C/O of headache and back pain  eICU Interventions  PRN fentanyl 12.5 to 50 mcg q2 hours prn pain     Intervention Category Intermediate Interventions: Pain - evaluation and management  DETERDING,ELIZABETH 11/18/2015, 11:43 PM

## 2015-11-19 ENCOUNTER — Encounter (HOSPITAL_COMMUNITY): Payer: Medicare Other

## 2015-11-19 DIAGNOSIS — G4730 Sleep apnea, unspecified: Secondary | ICD-10-CM

## 2015-11-19 DIAGNOSIS — I161 Hypertensive emergency: Principal | ICD-10-CM

## 2015-11-19 LAB — MRSA PCR SCREENING: MRSA BY PCR: NEGATIVE

## 2015-11-19 LAB — GLUCOSE, CAPILLARY
Glucose-Capillary: 225 mg/dL — ABNORMAL HIGH (ref 65–99)
Glucose-Capillary: 239 mg/dL — ABNORMAL HIGH (ref 65–99)
Glucose-Capillary: 210 mg/dL — ABNORMAL HIGH (ref 65–99)
Glucose-Capillary: 221 mg/dL — ABNORMAL HIGH (ref 65–99)

## 2015-11-19 LAB — LACTIC ACID, PLASMA: LACTIC ACID, VENOUS: 2.4 mmol/L — AB (ref 0.5–2.0)

## 2015-11-19 MED ORDER — WARFARIN SODIUM 7.5 MG PO TABS
7.5000 mg | ORAL_TABLET | Freq: Once | ORAL | Status: AC
Start: 2015-11-19 — End: 2015-11-19
  Administered 2015-11-19: 7.5 mg via ORAL
  Filled 2015-11-19: qty 1

## 2015-11-19 NOTE — H&P (Addendum)
PULMONARY / CRITICAL CARE MEDICINE   Name: Patrick [name] MRN: [id number] DOB: May 23, 1946    ADMISSION DATE:  11/18/2015  CHIEF COMPLAINT:  Hypertensive emergency  BRIEF 70 y.o. male with past med hx HTN, CKD 3-4, a-fib on Coumadin, CVA, DM2 presenting for Headache for 1 wk and vomiting for a several days. He has also noted weakness in both legs. Upon arrival to the ED SBP was > 270 . He states he has been taking his medications appropriately but was not able to take them this AM due to vomiting. He was seen on 4/18 by Dr Eppie Gibson who notes non-compliance with meds and HTN likely secondary to Clonidine withdrawal/ rebound. CT head was negative.   SIGNIFICANT EVENTS: 11/18/2015  - Admitting hospitalist noted a right facial droop. Head CT negative. MRI without any acute changes but chronic lacunar infarcs and chronic ischemic changes of posterior R MCA. Nicardipine gtt started at [facility] in the ED . Patient transferred to [facility]   SUBJECTIVE:  11/19/15 - off cardene gtt since 7am  (goal sbp 160-180). On oral bp meds.  Had some of his breakfast. Denies complaints. Son at bedside says - he came in for leg weakness. Says he is significantly improved and 30% away from baseline. AT home does ADLs. RN says he is very deconditioned. Chronic A Fib  - HR  77 ans bo 158/72. Code status - rediscussed - confirms DNR, DNI   VITAL SIGNS: Temp:  [97.7 F (36.5 C)-98.5 F (36.9 C)] 98.2 F (36.8 C) (04/22 0800) Pulse Rate:  [47-129] 70 (04/22 0800) Resp:  [0-30] 14 (04/22 0800) BP: (115-270)/(45-168) 124/53 mmHg (04/22 0800) SpO2:  [85 %-100 %] 95 % (04/22 0800) Weight:  [76.3 kg (168 lb 3.4 oz)-78.1 kg (172 lb 2.9 oz)] 78.1 kg (172 lb 2.9 oz) (04/22 0500) HEMODYNAMICS:   VENTILATOR SETTINGS:   INTAKE / OUTPUT:  Intake/Output Summary (Last 24 hours) at 11/19/15 0920 Last data filed at 11/19/15 0700  Gross per 24 hour  Intake 1463.33 ml  Output    125 ml  Net 1338.33 ml    PHYSICAL EXAMINATION: General:   NAD, AAOx3. Looks very deconditioned.  Neuro:  CN II-XII intact.  Slow speech but this is old. Moves all 4s HEENT:  Old right facial droop. NCAT Cardiovascular:  RRR, no m/r/g Lungs:  CTA b/l no w/r/r Abdomen: soft, nontender, normal bowel sounds Musculoskeletal:  Normal bulk and tone Skin:  No c/c/e  LABS:  CBC  Recent Labs Lab 11/15/15 1143 11/18/15 1359  WBC 5.6 7.1  HGB  --  12.9*  HCT 39.2 40.6  PLT 196 241   Coag's  Recent Labs Lab 11/14/15 1216 11/18/15 1359  APTT  --  35  INR 3.00 1.97*   BMET  Recent Labs Lab 11/15/15 1143 11/18/15 1359  NA 143 145  K 4.1 3.7  CL 98 105  CO2 23 20*  BUN 38* 41*  CREATININE 2.50* 2.97*  GLUCOSE 154* 251*   Electrolytes  Recent Labs Lab 11/15/15 1143 11/18/15 1359  CALCIUM 9.2 9.8   Sepsis Markers  Recent Labs Lab 11/15/15 1143 11/18/15 2159 11/19/15 0102  LATICACIDVEN 1.0 1.7 2.4*   ABG No results for input(s): PHART, PCO2ART, PO2ART in the last 168 hours. Liver Enzymes  Recent Labs Lab 11/15/15 1143 11/18/15 1359  AST 13 23  ALT 11 13*  ALKPHOS 85 76  BILITOT 0.6 1.0  ALBUMIN 4.3 4.4   Cardiac Enzymes No results for input(s): TROPONINI, PROBNP in  the last 168 hours. Glucose  Recent Labs Lab 11/15/15 1148 11/18/15 1409 11/18/15 2146 11/19/15 0727  GLUCAP 132* 238* 201* 225*    Imaging Ct Head Wo Contrast  11/18/2015  CLINICAL DATA:  "Per pt, states he started having a severe headache causing emesis yesterday-states he is unable to move both legs-states history of stroke" EXAM: CT HEAD WITHOUT CONTRAST TECHNIQUE: Contiguous axial images were obtained from the base of the skull through the vertex without intravenous contrast. COMPARISON:  11/15/2015 FINDINGS: There is central and cortical atrophy. Periventricular white matter changes are consistent with small vessel disease. Prominent CSF spaces again noted along the convexities appear stable. There is no intra or extra-axial fluid  collection or mass lesion. The basilar cisterns and ventricles have a normal appearance. There is no CT evidence for acute infarction or hemorrhage. Remote lacunar infarct is identified within the left thalamus. Bone windows are unremarkable. There is atherosclerotic calcification both internal carotid arteries. IMPRESSION: 1. Stable, prominent CSF spaces bilaterally at the convexities. 2. Chronic lacunar infarct of the left thalamus. 3.  No evidence for acute intracranial abnormality. Electronically Signed   By: Nolon Nations M.D.   On: 11/18/2015 14:45   Mr Brain Wo Contrast  11/18/2015  CLINICAL DATA:  70 year old male with headache and vomiting. Bilateral lower extremity weakness. Gait instability. Hypertensive, [id number] mmHg systolic. Initial encounter. EXAM: MRI HEAD WITHOUT CONTRAST TECHNIQUE: Multiplanar, multiecho pulse sequences of the brain and surrounding structures were obtained without intravenous contrast. COMPARISON:  Head CT without contrast 1431 hours today. Brain MRI 11/22/2010. FINDINGS: Major intracranial vascular flow voids are stable since 2012. No restricted diffusion or evidence of acute infarction. Chronic bilateral extra-axial CSF over both cerebral convexities has mildly increased since 2012, and appears mostly due to subarachnoid space CSF. However, there might be tiny associated subdural hygromas (series 10, image 16). Basilar cisterns remain patent. No significant associated intracranial mass effect. There are chronic lacunar infarcts in the left thalamus and right cerebellum. Chronic hemorrhage in the right cerebellar tonsil is unchanged. Chronic micro hemorrhage in the lateral right occipital lobe is new since 2012. There is an associated nearby small area of cortical encephalomalacia in the posterior right temporal lobe best seen on series 7, image 16. Scattered small nonspecific cerebral white matter T2 and FLAIR hyperintense foci also have increased. No acute intracranial  hemorrhage identified. No ventriculomegaly. Negative pituitary, cervicomedullary junction and visualized cervical spine. Visualized bone marrow signal is within normal limits. Visible internal auditory structures appear normal. Mastoids are clear. Chronic but improved mild ethmoid sinus mucosal thickening. Negative orbit and scalp soft tissues. IMPRESSION: 1.  No acute intracranial abnormality. 2. Chronic ischemic disease with mild progression in the posterior right MCA territory since 2012. 3. Chronic increased subarachnoid space CSF over both cerebral convexities. There may now be tiny superimposed subdural hygromas, but significance is doubtful. Electronically Signed   By: Genevie Ann M.D.   On: 11/18/2015 18:07     ASSESSMENT / PLAN: 70 yo male with Right facial droop TIA 2/2 hypertensive emergency/accelerated hypertension from clonidine withdrawal due to medication non-adhearance   PULMONARY A: Not acitive P:   monitoir   CARDIOVASCULAR A:  Hypertensive emergency A-fib (CHADS2 > 2) On warfarin CAD Mild troponinemia - likley 2/2 hypertension    - BP improved to goal with reintrdouction of oral meds. Off cardene since 7am 11/19/2015  P:  - dc Nicardipine gtt - goal SBP 156-180 while on cardene - allow slow auto-reg /22/17 - Restart clonidine -  Continue diltiazem XR 120mg  Qday - INR subtherapeutic - continue warfarin - ASA 81mg  Qday - trend troponins  RENAL A:   CKD 2 (Baseline Scr 1.8-2.5) Hypertensive proteinuria  Mild AKI - 2/2 Hypertensive nephropathy AGMA - unclear etiology.  Urin ketones negative.  Possible early DKA vs lactic acidosis  P:   - Control BP as above. - - hold lisinopril for now - Hold metformin - check serum lactate  GASTROINTESTINAL A:   GERD HLD  P:   - continue pravastatin  HEMATOLOGIC A:  P:  - check CBC andINr (on coumading)  INFECTIOUS A:   Not active  P:    ENDOCRINE A:   Hyperglycemia - without DKA - no ketones (though may be  early DKA given profound AG)   P:   - DM diet - SSI - Finger sticks and SSI  NEUROLOGIC A:   TIA - 2/2 hypertension - now resolved P:   RASS goal: 0 - Appreciate neurology recs   CODE DNR/DNI - again confirmed with RN and son present at bedside 11/19/15 . VERY DECONDITIONED - PT ordered. Likely needs SNF v inpatient rehab. Also,? Depressed - TRH to investigate (wife had stroke)    Move to tele. TRH primary from 11/20/15 and PCCM off - d/w Dr Dorothy Puffer    Dr. Brand Males, M.D., [facility].C.P Pulmonary and Critical Care Medicine Staff Physician [place] [facility] Pager: [phone], If no answer or between  15:00h - 7:00h: call [phone]  11/19/2015 9:20 AM

## 2015-11-19 NOTE — Evaluation (Signed)
Physical Therapy Evaluation Patient Details Name: Patrick [name] MRN: [id number] DOB: Apr 02, 1946 Today's Date: 11/19/2015   History of Present Illness  70 y.o. male with past med hx HTN, CKD 3-4, a-fib on Coumadin, CVA, DM2 presenting for Headache for 1 wk and vomiting for a several days. He has also noted weakness in both legs. Upon arrival to the ED SBP was > 270 . He states he has been taking his medications appropriately but was not able to take them this AM due to vomiting.  PMH positive for Hypertension; CKD, stage II; CAD; Depression; Gout; Hyperlipidemia; CVA; GERD; Diabetes mellitus type II; Upper GI bleeding; Paroxysmal atrial fibrillation; Atrial fibrillation; Myocardial infarction; Shortness of breath; Sleep apnea; Stomach ulcer; Stroke; Permanent atrial fibrillation; Chronic anticoagulation, on coumadin; and Tachy-brady syndrome  Clinical Impression  Patient presents with decreased mobility secondary to deficits listed in PT problem list below.   He will benefit from skilled PT in the acute setting to allow d/c home following CIR level rehab stay.    Follow Up Recommendations CIR    Equipment Recommendations  Rolling walker with 5" wheels    Recommendations for Other Services       Precautions / Restrictions Precautions Precautions: Fall      Mobility  Bed Mobility Overal bed mobility: Needs Assistance Bed Mobility: Supine to Sit     Supine to sit: Mod assist     General bed mobility comments: heavy assist to lift trunk upright, pt very slow but able to move legs off bed with guidance  Transfers Overall transfer level: Needs assistance Equipment used: Rolling walker (2 wheeled) Transfers: Sit to/from Stand Sit to Stand: Mod assist;+2 physical assistance         General transfer comment: increased time to stand, but able to assist with cues for hand placement and lifting assist from EOB  Ambulation/Gait Ambulation/Gait assistance: Mod assist;Max assist;+2  safety/equipment Ambulation Distance (Feet): 10 Feet Assistive device: Rolling walker (2 wheeled) Gait Pattern/deviations: Step-to pattern;Decreased stride length;Shuffle;Trunk flexed;Decreased dorsiflexion - left;Decreased dorsiflexion - right     General Gait Details: initially needed facilitation for weight shift to allow LE progression, at times needing assist to progress R foot due to dragging on floor, then able to sequence and weight shift with less assist once started walking  Stairs            Wheelchair Mobility    Modified Rankin (Stroke Patients Only) Modified Rankin (Stroke Patients Only) Pre-Morbid Rankin Score: Slight disability Modified Rankin: Moderately severe disability     Balance Overall balance assessment: Needs assistance Sitting-balance support: Feet supported;Bilateral upper extremity supported Sitting balance-Leahy Scale: Poor Sitting balance - Comments: mod A for sitting balance initially then able to sit with S, but assist again needed for dynamic activities   Standing balance support: Bilateral upper extremity supported Standing balance-Leahy Scale: Poor Standing balance comment: assist and UE support for balance                             Pertinent Vitals/Pain Pain Assessment: No/denies pain    Home Living Family/patient expects to be discharged to:: Private residence Living Arrangements: Spouse/significant other Available Help at Discharge: Family Type of Home: Apartment Home Access: Level entry     Home Layout: One level Home Equipment: None Additional Comments: patient's wife just here last week due to stroke    Prior Function Level of Independence: Independent  Hand Dominance        Extremity/Trunk Assessment   Upper Extremity Assessment: Overall WFL for tasks assessed           Lower Extremity Assessment: RLE deficits/detail;LLE deficits/detail RLE Deficits / Details: AROM WFL,  strength hip flexion 2+/5, knee extension 2/5, ankle DF 3/5 LLE Deficits / Details: AROM WFL, strength hip flexion 2+/5, knee extension 3/5, ankle DF 3/5     Communication   Communication: No difficulties (slower speech)  Cognition Arousal/Alertness: Lethargic Behavior During Therapy: Flat affect Overall Cognitive Status: Impaired/Different from baseline Area of Impairment: Following commands;Problem solving;Attention   Current Attention Level: Sustained   Following Commands: Follows one step commands with increased time     Problem Solving: Slow processing;Decreased initiation;Difficulty sequencing;Requires verbal cues      General Comments General comments (skin integrity, edema, etc.): son and daughter in law in room and gave history as pt sleeping initially; pt wears glasses and was able to find and read breakfast selection recalling day on his own    Exercises        Assessment/Plan    PT Assessment Patient needs continued PT services  PT Diagnosis Abnormality of gait;Generalized weakness   PT Problem List Decreased strength;Decreased cognition;Decreased activity tolerance;Decreased safety awareness;Decreased balance;Decreased mobility;Decreased coordination  PT Treatment Interventions DME instruction;Balance training;Gait training;Functional mobility training;Patient/family education;Therapeutic activities;Therapeutic exercise   PT Goals (Current goals can be found in the Care Plan section) Acute Rehab PT Goals Patient Stated Goal: To get stronger PT Goal Formulation: With patient/family Time For Goal Achievement: 11/26/15 Potential to Achieve Goals: Good    Frequency Min 4X/week   Barriers to discharge Decreased caregiver support      Co-evaluation               End of Session Equipment Utilized During Treatment: Gait belt Activity Tolerance: Patient limited by lethargy Patient left: in chair;with family/visitor present;with call bell/phone within  reach;with nursing/sitter in room           Time: 0810-0845 PT Time Calculation (min) (ACUTE ONLY): 35 min   Charges:   PT Evaluation $PT Eval High Complexity: 1 Procedure PT Treatments $Gait Training: 8-22 mins   PT G CodesReginia Naas Dec 10, 2015, 9:03 AM  Magda Kiel, [place] 2015/12/10

## 2015-11-19 NOTE — Progress Notes (Signed)
STROKE TEAM PROGRESS NOTE   HISTORY OF PRESENT ILLNESS Patrick [name] is a 70 y.o. male with hx of HTN on catapres patch. He has not been using the patch lately because he cannot afford it. In thsi setting went to ED a couple of days ago with headache and was worked up and sent home. He then developed a headache and came back to OSH ED where he was again found to be extremily hypertensive. The is a questionable report of there being minor right sided weakness but pateint denies this and tells me that he noramally has left leg pain. His legs were weak prior to coming to the hospital but this was a bialteral issue. HE was transferred to [facility] with concerns for PRES. However his MRI was negative.   SUBJECTIVE (INTERVAL HISTORY) His son is at the bedside.  Overall he feels his condition is completely resolved. BP better controlled with cardene drip. No new neuro deficit. MRI negative for acute stroke. INR 1.97 on admission. Continue coumadin, not DOAC candidate due to CKD.    OBJECTIVE Temp:  [97.7 F (36.5 C)-98.5 F (36.9 C)] 98.3 F (36.8 C) (04/22 0400) Pulse Rate:  [47-129] 59 (04/22 0700) Cardiac Rhythm:  [-] Atrial fibrillation (04/22 0500) Resp:  [12-30] 14 (04/22 0700) BP: (116-270)/(47-168) 118/54 mmHg (04/22 0700) SpO2:  [85 %-100 %] 89 % (04/22 0700) Weight:  [76.3 kg (168 lb 3.4 oz)-78.1 kg (172 lb 2.9 oz)] 78.1 kg (172 lb 2.9 oz) (04/22 0500)  CBC:  Recent Labs Lab 11/15/15 1143 11/18/15 1359  WBC 5.6 7.1  NEUTROABS  --  5.9  HGB  --  12.9*  HCT 39.2 40.6  MCV 79 80.1  PLT 196 [id number]    Basic Metabolic Panel:  Recent Labs Lab 11/15/15 1143 11/18/15 1359  NA 143 145  K 4.1 3.7  CL 98 105  CO2 23 20*  GLUCOSE 154* 251*  BUN 38* 41*  CREATININE 2.50* 2.97*  CALCIUM 9.2 9.8    Lipid Panel:    Component Value Date/Time   CHOL 121 03/29/2014 1554   TRIG 208* 03/29/2014 1554   HDL 46 03/29/2014 1554   CHOLHDL 2.6 03/29/2014 1554   VLDL 42* 03/29/2014 1554    LDLCALC 33 03/29/2014 1554   HgbA1c:  Lab Results  Component Value Date   HGBA1C 7.7 08/29/2015   Urine Drug Screen:    Component Value Date/Time   LABOPIA NONE DETECTED 11/22/2010 0831   COCAINSCRNUR NONE DETECTED 11/22/2010 0831   LABBENZ NONE DETECTED 11/22/2010 0831   AMPHETMU NONE DETECTED 11/22/2010 0831   THCU NONE DETECTED 11/22/2010 0831   LABBARB  11/22/2010 0831    NONE DETECTED        DRUG SCREEN FOR MEDICAL PURPOSES ONLY.  IF CONFIRMATION IS NEEDED FOR ANY PURPOSE, NOTIFY LAB WITHIN 5 DAYS.        LOWEST DETECTABLE LIMITS FOR URINE DRUG SCREEN Drug Class       Cutoff (ng/mL) Amphetamine      1000 Barbiturate      200 Benzodiazepine   [id number] Tricyclics       [id number] Opiates          300 Cocaine          300 THC              50      IMAGING I have personally reviewed the radiological images below and agree with the radiology interpretations.  Ct Head Wo Contrast 11/18/2015  1. Stable, prominent CSF spaces bilaterally at the convexities.  2. Chronic lacunar infarct of the left thalamus.  3.  No evidence for acute intracranial abnormality.   Mr Brain Wo Contrast 11/18/2015   1. No acute intracranial abnormality.  2. Chronic ischemic disease with mild progression in the posterior right MCA territory since 2012.  3. Chronic increased subarachnoid space CSF over both cerebral convexities. There may now be tiny superimposed subdural hygromas, but significance is doubtful.  CUS - pending  TCD - pending  TTE - pending   PHYSICAL EXAM  Temp:  [97.7 F (36.5 C)-98.5 F (36.9 C)] 98.2 F (36.8 C) (04/22 0800) Pulse Rate:  [47-129] 70 (04/22 0800) Resp:  [0-30] 14 (04/22 0800) BP: (115-270)/(45-168) 173/74 mmHg (04/22 0956) SpO2:  [85 %-100 %] 95 % (04/22 0800) Weight:  [168 lb 3.4 oz (76.3 kg)-172 lb 2.9 oz (78.1 kg)] 172 lb 2.9 oz (78.1 kg) (04/22 0500)  General - Well nourished, well developed, in no apparent distress, depressed mood.  Ophthalmologic -  Fundi not visualized due to eye movement.  Cardiovascular - irregularly irregular heart rate and rhythm.  Mental Status -  Level of arousal and orientation to time, place, and person were intact. Language including expression, repetition, comprehension was assessed and found intact. Naming 2/3 Fund of Knowledge was assessed and was intact.  Cranial Nerves II - XII - II - Visual field intact OU. III, IV, VI - Extraocular movements intact. V - Facial sensation intact bilaterally. VII - Facial movement intact bilaterally. VIII - Hearing & vestibular intact bilaterally. X - Palate elevates symmetrically. XI - Chin turning & shoulder shrug intact bilaterally. XII - Tongue protrusion intact.  Motor Strength - The patient's strength was normal in all extremities and pronator drift was absent.  Bulk was normal and fasciculations were absent.   Motor Tone - Muscle tone was assessed at the neck and appendages and was normal.  Reflexes - The patient's reflexes were 1+ in all extremities and he had no pathological reflexes.  Sensory - Light touch, temperature/pinprick were assessed and were symmetrical.    Coordination - The patient had normal movements in the hands with no ataxia or dysmetria.  Tremor was absent.  Gait and Station - deferred due to safety concerns.    ASSESSMENT/PLAN [name] is a 70 y.o. male with history of hypertension, medical noncompliance secondary to finances,  chronic kidney disease, coronary artery disease with previous MI, gout, hyperlipidemia, previous stroke, diabetes mellitus, history of upper GI bleed, atrial fibrillation on warfarin (INR 1.97), and OSA, presenting with severe hypertension and headache. He did not receive IV t-PA due to a negative MRI, late presentation, and warfarin therapy.  Hypertensive emergency  Resultant  resolution of deficits  MRI - negative for stroke  Carotid Doppler - pending  TCD - pending  2D Echo - pending  LDL  - pending  HgbA1c pending  VTE prophylaxis - warfarin  Diet Heart Room service appropriate?: Yes; Fluid consistency:: Thin  aspirin 81 mg daily and warfarin daily prior to admission, now on aspirin 81 mg daily and warfarin daily.  Patient counseled to be compliant with his antithrombotic medications  Ongoing aggressive stroke risk factor management  Therapy recommendations: CIR recommended - rehabilitation M.D. consult pending.  Disposition: Pending  Chronic afib on coumadin  INR 1.97 on admission  INR daily monitoring  INR goal 2-3  Not DOAC candidate due to CKD  On cardiazem  History of stroke  Left thalamic  10/2010  No significant residue as per son  Hypertension  Blood pressure running mildly high  On cardene drip  Continue home po meds  Wean off cardene as able  Hyperlipidemia  Home meds:  Pravachol 80 mg daily resumed in hospital  LDL pending, goal < 70  Continue statin at discharge  Diabetes  HgbA1c pending, goal < 7.0  Uncontrolled  SSI  Management as per primary team  Other Stroke Risk Factors  Advanced age  Cigarette smoker, quit smoking 39 years ago.  Hx stroke/TIA  Coronary artery disease  Obstructive sleep apnea  Other Active Problems  Chronic kidney disease  Hospital day # 1  This patient is critically ill due to hypertensive emergency, history of stroke, afib on coumadin and at significant risk of neurological worsening, death form recurrent stroke, hemorrhage, heart failure. This patient's care requires constant monitoring of vital signs, hemodynamics, respiratory and cardiac monitoring, review of multiple databases, neurological assessment, discussion with family, other specialists and medical decision making of high complexity. I spent 35 minutes of neurocritical care time in the care of this patient.  Rosalin Hawking, MD PhD Stroke Neurology 11/19/2015 5:19 PM  To contact Stroke Continuity provider, please refer to  [email or web address] After hours, contact General Neurology

## 2015-11-19 NOTE — Progress Notes (Signed)
IM teaching service to resume care tomorrow morning (4/23) at 7AM.   Albin Felling, MD, MPH Internal Medicine Resident, PGY-II Pager: [phone]

## 2015-11-19 NOTE — Progress Notes (Signed)
revisiion : Dr Arcelia Jew of IMTS - d/w and IMTS to be primary from 11/20/15   Dr. Brand Males, M.D., [facility].C.P Pulmonary and Critical Care Medicine Staff Physician [facility] Pager: [phone], If no answer or between  15:00h - 7:00h: call [phone]  11/19/2015 5:49 PM

## 2015-11-19 NOTE — Progress Notes (Signed)
ANTICOAGULATION CONSULT NOTE - Follow Up Consult  Pharmacy Consult for warfarin Indication: atrial fibrillation  No Known Allergies  Patient Measurements: Height: 5\' 9"  (175.3 cm) Weight: 172 lb 2.9 oz (78.1 kg) IBW/kg (Calculated) : 70.7  Vital Signs: Temp: 98.6 F (37 C) (04/22 1141) Temp Source: Axillary (04/22 1141) BP: 147/84 mmHg (04/22 1300) Pulse Rate: 78 (04/22 1300)  Labs:  Recent Labs  11/18/15 1359  HGB 12.9*  HCT 40.6  PLT 241  APTT 35  LABPROT 21.6*  INR 1.97*  CREATININE 2.97*    Estimated Creatinine Clearance: 24.1 mL/min (by C-G formula based on Cr of 2.97).  Assessment: 70 yo male presenting from [facility] with HTN emergency - HA, vomiting, weakness in BL legs x 1 week  PMH: HTN, Afib on warfarin, CKD 3, DM2  AC: warfarin pta for a fib. INR 1.97, 7.5 mg x 1 on 4/21  PTA warfarin 5 mg daily  Renal: SCr 2.97  Heme: H&H 12.9/40.6, Plt 241  Goal of Therapy:  INR 2-3 Monitor platelets by anticoagulation protocol: Yes   Plan:  Warfarin 7.5 mg x 1 tonight Daily INR, CBC q72h Monitor s/sx of bleeding  Levester Fresh, PharmD, BCPS, [facility] Clinical Pharmacist Pager [phone] 11/19/2015 1:15 PM

## 2015-11-19 NOTE — Plan of Care (Signed)
Problem: Safety: Goal: Ability to remain free from injury will improve Outcome: Completed/Met Date Met:  11/19/15 Pt has remained free from falls  Problem: Pain Managment: Goal: General experience of comfort will improve Outcome: Progressing Patient pain 2/10, he is able to communicate pain scores and is cooperative with interventions.

## 2015-11-19 NOTE — Progress Notes (Signed)
Rehab Admissions Coordinator Note:  Patient was screened by Cleatrice Burke for appropriateness for an Inpatient Acute Rehab Consult per PT recommendation.  At this time, we are recommending inpt rehab consult.Cleatrice Burke 11/19/2015, 10:00 AM  I can be reached at [phone].

## 2015-11-19 NOTE — Progress Notes (Signed)
Received from ICU via wheelchair; 2 max assist to bed; generalized weakness; denies headache presently; oriented to unit and room; family at bedside.

## 2015-11-20 ENCOUNTER — Encounter (HOSPITAL_COMMUNITY): Payer: Medicare Other

## 2015-11-20 ENCOUNTER — Inpatient Hospital Stay (HOSPITAL_COMMUNITY): Payer: Medicare Other

## 2015-11-20 DIAGNOSIS — Z7984 Long term (current) use of oral hypoglycemic drugs: Secondary | ICD-10-CM

## 2015-11-20 DIAGNOSIS — I509 Heart failure, unspecified: Secondary | ICD-10-CM

## 2015-11-20 DIAGNOSIS — Z9114 Patient's other noncompliance with medication regimen: Secondary | ICD-10-CM

## 2015-11-20 DIAGNOSIS — I639 Cerebral infarction, unspecified: Secondary | ICD-10-CM

## 2015-11-20 DIAGNOSIS — E1165 Type 2 diabetes mellitus with hyperglycemia: Secondary | ICD-10-CM

## 2015-11-20 LAB — MAGNESIUM: Magnesium: 1.8 mg/dL (ref 1.7–2.4)

## 2015-11-20 LAB — BASIC METABOLIC PANEL
Anion gap: 13 (ref 5–15)
BUN: 31 mg/dL — AB (ref 6–20)
CALCIUM: 8.9 mg/dL (ref 8.9–10.3)
CO2: 22 mmol/L (ref 22–32)
CREATININE: 2.49 mg/dL — AB (ref 0.61–1.24)
Chloride: 103 mmol/L (ref 101–111)
GFR calc Af Amer: 29 mL/min — ABNORMAL LOW (ref >60)
GFR, EST NON AFRICAN AMERICAN: 25 mL/min — AB (ref >60)
Glucose, Bld: 195 mg/dL — ABNORMAL HIGH (ref 65–99)
POTASSIUM: 4.2 mmol/L (ref 3.5–5.1)
SODIUM: 138 mmol/L (ref 135–145)

## 2015-11-20 LAB — GLUCOSE, CAPILLARY
GLUCOSE-CAPILLARY: 191 mg/dL — AB (ref 65–99)
Glucose-Capillary: 191 mg/dL — ABNORMAL HIGH (ref 65–99)
Glucose-Capillary: 181 mg/dL — ABNORMAL HIGH (ref 65–99)
Glucose-Capillary: 111 mg/dL — ABNORMAL HIGH (ref 65–99)
Glucose-Capillary: 167 mg/dL — ABNORMAL HIGH (ref 65–99)

## 2015-11-20 LAB — PHOSPHORUS: Phosphorus: 2.9 mg/dL (ref 2.5–4.6)

## 2015-11-20 LAB — TROPONIN I
TROPONIN I: 0.1 ng/mL — AB (ref <0.031)
TROPONIN I: 0.09 ng/mL — AB (ref <0.031)
Troponin I: 0.09 ng/mL — ABNORMAL HIGH (ref <0.031)

## 2015-11-20 LAB — PROTIME-INR
INR: 3.2 — ABNORMAL HIGH (ref 0.00–1.49)
PROTHROMBIN TIME: 32.1 s — AB (ref 11.6–15.2)

## 2015-11-20 LAB — LACTIC ACID, PLASMA: LACTIC ACID, VENOUS: 1.2 mmol/L (ref 0.5–2.0)

## 2015-11-20 LAB — LIPID PANEL
Cholesterol: 128 mg/dL (ref 0–200)
HDL: 48 mg/dL (ref >40)
LDL CALC: 65 mg/dL (ref 0–99)
Total CHOL/HDL Ratio: 2.7 RATIO
Triglycerides: 77 mg/dL (ref <150)
VLDL: 15 mg/dL (ref 0–40)

## 2015-11-20 LAB — ECHOCARDIOGRAM COMPLETE
HEIGHTINCHES: 69 in
WEIGHTICAEL: 2886.4 [oz_av]

## 2015-11-20 MED ORDER — WARFARIN SODIUM 5 MG PO TABS
2.5000 mg | ORAL_TABLET | Freq: Once | ORAL | Status: AC
Start: 2015-11-20 — End: 2015-11-20
  Administered 2015-11-20: 2.5 mg via ORAL
  Filled 2015-11-20: qty 0.5

## 2015-11-20 MED ORDER — INSULIN ASPART 100 UNIT/ML ~~LOC~~ SOLN
0.0000 [IU] | Freq: Three times a day (TID) | SUBCUTANEOUS | Status: DC
  Administered 2015-11-20 – 2015-11-21 (×3): 3 [IU] via SUBCUTANEOUS
  Administered 2015-11-21: 5 [IU] via SUBCUTANEOUS

## 2015-11-20 MED ORDER — ACETAMINOPHEN 325 MG PO TABS
650.0000 mg | ORAL_TABLET | Freq: Four times a day (QID) | ORAL | Status: DC | PRN
  Administered 2015-11-20 – 2015-11-21 (×4): 650 mg via ORAL
  Filled 2015-11-20 (×4): qty 2

## 2015-11-20 MED ORDER — HYDRALAZINE HCL 20 MG/ML IJ SOLN
5.0000 mg | Freq: Four times a day (QID) | INTRAMUSCULAR | Status: DC | PRN
  Administered 2015-11-21 (×2): 5 mg via INTRAVENOUS
  Filled 2015-11-20 (×3): qty 1

## 2015-11-20 MED ORDER — NEPRO/CARBSTEADY PO LIQD: 237.0000 mL | Freq: Two times a day (BID) | ORAL | Status: DC

## 2015-11-20 MED ORDER — INSULIN ASPART 100 UNIT/ML ~~LOC~~ SOLN: 0.0000 [IU] | Freq: Every day | SUBCUTANEOUS | Status: DC

## 2015-11-20 MED ORDER — SENNOSIDES-DOCUSATE SODIUM 8.6-50 MG PO TABS: 1.0000 | ORAL_TABLET | Freq: Every evening | ORAL | Status: DC | PRN

## 2015-11-20 MED ORDER — LISINOPRIL 10 MG PO TABS
10.0000 mg | ORAL_TABLET | Freq: Every day | ORAL | Status: DC
Start: 2015-11-20 — End: 2015-11-20

## 2015-11-20 MED ORDER — POLYETHYLENE GLYCOL 3350 17 G PO PACK
17.0000 g | PACK | Freq: Every day | ORAL | Status: DC
  Administered 2015-11-20 – 2015-11-22 (×3): 17 g via ORAL
  Filled 2015-11-20 (×3): qty 1

## 2015-11-20 MED ORDER — LISINOPRIL 10 MG PO TABS
10.0000 mg | ORAL_TABLET | Freq: Every day | ORAL | Status: DC
  Administered 2015-11-20 – 2015-11-21 (×2): 10 mg via ORAL
  Filled 2015-11-20 (×2): qty 1

## 2015-11-20 NOTE — Progress Notes (Signed)
  Echocardiogram 2D Echocardiogram has been performed.  Patrick [name] 11/20/2015, 10:02 AM

## 2015-11-20 NOTE — Progress Notes (Signed)
*  PRELIMINARY RESULTS* Vascular Ultrasound Carotid Duplex (Doppler) has been completed.  Preliminary findings: Bilateral: No significant (1-39%) ICA stenosis. Antegrade vertebral flow.    Landry Mellow, RDMS, RVT  11/20/2015, 12:29 PM

## 2015-11-20 NOTE — Progress Notes (Signed)
Transfer Summary Note  Hospital Course:  [name] is a 70 yo man with a PMH of L anterior thalamic infarct in 2012 (residual right facial weakness, slow walking), CKD Stage 3, chronic atrial fibrillation (CHADS-VASc 5, on Warfarin), and depression who presented with one week of a headache and new bilateral LE weakness with vomiting. He presented to the ED with SBPs >270. The patient said that he had not been taking any of his medication, including clonidine, over the past few days prior to admission since he was too busy tending to his wife, who is in the hospital with a stroke. His INR was 3.2. Head CT showed no acute findings. MRI showed no acute infarct, but demonstrated chronic ischemic disease with mild progression in the posterior right MCA territory since 2012. BP was initially controlled with cardene gtt but was transitioned to diltiazem and clonidine only. Blood pressure has been ~ 155-175/70-90 since 4/22. He was evaluated by physical therapy who recommended evaluation for CIR.  Subjective:  Patient says his headache is persistent yet improved. He says he is still afraid to walk. He denies any chest pain for vision changes. He is agreeable to being evaluated for CIR. Patient's son is at baseline, who indicates that his affect and behavior is similar to how he is at home.    Objective: Vital signs in last 24 hours: Filed Vitals:   11/20/15 0105 11/20/15 0337 11/20/15 0500 11/20/15 0617  BP: 197/96 181/99  176/85  Pulse: 76   89  Temp: 98.1 F (36.7 C)   98 F (36.7 C)  TempSrc: Oral   Oral  Resp: 15   16  Height:      Weight:   180 lb 6.4 oz (81.829 kg)   SpO2: 97%   99%   Weight change: 12 lb 3 oz (5.529 kg)  Intake/Output Summary (Last 24 hours) at 11/20/15 0904 Last data filed at 11/20/15 0045  Gross per 24 hour  Intake      0 ml  Output    565 ml  Net   -565 ml   Physical Exam General: Lying in bed, NAD HEENT: Dentures in place. MMM. No carotid bruits, Neck  supple. [place]/AT Cardiovascular: Irregularly irregular rhythm, no murmurs, rubs, or gallops. Pulmonary: CTAB, unlabored breathing Abdominal: Soft, NT/ND. Normal bowel sounds Extremities: No clubbing, cyanosis, or edema Neurological: Speech normal except as below (see psychiatric). AAOx4. EOMI, Tongue midline, Residual right facial weakness, Sensation to light touch in tact throughout. 5/5 strength in upper extremities, 4+/5 strength in RLE and LLE. 1+ patellar reflex bilaterally Psychiatric: Blunted affect with minimal spontaneous speech.   Lab Results: Basic Metabolic Panel:  Recent Labs Lab 11/18/15 1359 11/20/15 0424  NA 145 138  K 3.7 4.2  CL 105 103  CO2 20* 22  GLUCOSE 251* 195*  BUN 41* 31*  CREATININE 2.97* 2.49*  CALCIUM 9.8 8.9  MG  --  1.8  PHOS  --  2.9   Liver Function Tests:  Recent Labs Lab 11/15/15 1143 11/18/15 1359  AST 13 23  ALT 11 13*  ALKPHOS 85 76  BILITOT 0.6 1.0  PROT 7.1 7.8  ALBUMIN 4.3 4.4   CBC:  Recent Labs Lab 11/15/15 1143 11/18/15 1359  WBC 5.6 7.1  NEUTROABS  --  5.9  HGB  --  12.9*  HCT 39.2 40.6  MCV 79 80.1  PLT 196 241   Cardiac Enzymes:  Recent Labs Lab 11/15/15 1143 11/20/15 0424  CKTOTAL 83  --  TROPONINI  --  0.09*   CBG:  Recent Labs Lab 11/18/15 2146 11/19/15 0727 11/19/15 1115 11/19/15 1648 11/19/15 2137 11/20/15 0620  GLUCAP 201* 225* 239* 210* 221* 191*   Coagulation:  Recent Labs Lab 11/14/15 1216 11/18/15 1359 11/20/15 0424  LABPROT  --  21.6* 32.1*  INR 3.00 1.97* 3.20*   Urinalysis:  Recent Labs Lab 11/18/15 1701  COLORURINE YELLOW  LABSPEC 1.014  PHURINE 6.5  GLUCOSEU 100*  HGBUR SMALL*  BILIRUBINUR NEGATIVE  KETONESUR NEGATIVE  PROTEINUR >300*  NITRITE NEGATIVE  LEUKOCYTESUR NEGATIVE    Studies/Results: Ct Head Wo Contrast  11/18/2015  CLINICAL DATA:  "Per pt, states he started having a severe headache causing emesis yesterday-states he is unable to move both  legs-states history of stroke" EXAM: CT HEAD WITHOUT CONTRAST TECHNIQUE: Contiguous axial images were obtained from the base of the skull through the vertex without intravenous contrast. COMPARISON:  11/15/2015 FINDINGS: There is central and cortical atrophy. Periventricular white matter changes are consistent with small vessel disease. Prominent CSF spaces again noted along the convexities appear stable. There is no intra or extra-axial fluid collection or mass lesion. The basilar cisterns and ventricles have a normal appearance. There is no CT evidence for acute infarction or hemorrhage. Remote lacunar infarct is identified within the left thalamus. Bone windows are unremarkable. There is atherosclerotic calcification both internal carotid arteries. IMPRESSION: 1. Stable, prominent CSF spaces bilaterally at the convexities. 2. Chronic lacunar infarct of the left thalamus. 3.  No evidence for acute intracranial abnormality. Electronically Signed   By: Nolon Nations M.D.   On: 11/18/2015 14:45   Mr Brain Wo Contrast  11/18/2015  CLINICAL DATA:  70 year old male with headache and vomiting. Bilateral lower extremity weakness. Gait instability. Hypertensive, A999333 mmHg systolic. Initial encounter. EXAM: MRI HEAD WITHOUT CONTRAST TECHNIQUE: Multiplanar, multiecho pulse sequences of the brain and surrounding structures were obtained without intravenous contrast. COMPARISON:  Head CT without contrast 1431 hours today. Brain MRI 11/22/2010. FINDINGS: Major intracranial vascular flow voids are stable since 2012. No restricted diffusion or evidence of acute infarction. Chronic bilateral extra-axial CSF over both cerebral convexities has mildly increased since 2012, and appears mostly due to subarachnoid space CSF. However, there might be tiny associated subdural hygromas (series 10, image 16). Basilar cisterns remain patent. No significant associated intracranial mass effect. There are chronic lacunar infarcts in the  left thalamus and right cerebellum. Chronic hemorrhage in the right cerebellar tonsil is unchanged. Chronic micro hemorrhage in the lateral right occipital lobe is new since 2012. There is an associated nearby small area of cortical encephalomalacia in the posterior right temporal lobe best seen on series 7, image 16. Scattered small nonspecific cerebral white matter T2 and FLAIR hyperintense foci also have increased. No acute intracranial hemorrhage identified. No ventriculomegaly. Negative pituitary, cervicomedullary junction and visualized cervical spine. Visualized bone marrow signal is within normal limits. Visible internal auditory structures appear normal. Mastoids are clear. Chronic but improved mild ethmoid sinus mucosal thickening. Negative orbit and scalp soft tissues. IMPRESSION: 1.  No acute intracranial abnormality. 2. Chronic ischemic disease with mild progression in the posterior right MCA territory since 2012. 3. Chronic increased subarachnoid space CSF over both cerebral convexities. There may now be tiny superimposed subdural hygromas, but significance is doubtful. Electronically Signed   By: Genevie Ann M.D.   On: 11/18/2015 18:07   Medications: I have reviewed the patient's current medications. Scheduled Meds: . allopurinol  200 mg Oral Daily  . aspirin EC  81 mg Oral Daily  . cloNIDine  0.3 mg Oral TID  . diltiazem  120 mg Oral Daily  . insulin aspart  0-5 Units Subcutaneous QHS  . insulin aspart  0-9 Units Subcutaneous TID WC  . pravastatin  80 mg Oral q1800  . Warfarin - Pharmacist Dosing Inpatient   Does not apply q1800   Continuous Infusions:  PRN Meds:.acetaminophen Assessment/Plan:  Hypertensive Emergency secondary to Clonidine Withdrawal: Patient was non-adherent with clonidine due to a family emergency. His symptoms of headache and lower extremity weakness have abated; nonetheless, he will benefit from an evaluation for CIR placement. Blood pressure 176/85 this morning on  oral diltiazem and clonidine only. Recurrent CVA has been ruled out. Patient will benefit from coming off of clonidine with a gradual taper to prevent future episodes. We will reintroduce his other home blood pressure medications, lisinopril 10 mg and furosemide 20 mg, gradually. - Clonidine 0.3 mg TID - Diltiazem 120 mg daily - ASA 81 mg - From stroke workup - TCD, A1c, LDL, TTE, Carotid doppers pending  Chronic Atrial Fibrillation (CHA2DS2-VASc 6): Despite non-adherence, INR has been near therapeutic range. INR supratherapeutic at 3.2 this AM. - Diltiazem as above - Warfarin per pharm  T2DM: On metformin 24 hr tablet 1000 mg daily at home. CBGs have been in 200s. - Change SSI from sensitive to moderate  DVT PPx: On Warfarin  Dispo: Disposition is deferred at this time, awaiting improvement of current medical problems.  Anticipated discharge in approximately 1-2 day(s). Awaiting evaluation for CIR.  The patient does have a current PCP Milagros Loll, MD) and does need an [facility] hospital follow-up appointment after discharge.  The patient does have transportation limitations that hinder transportation to clinic appointments.  .Services Needed at time of discharge: Y = Yes, Blank = No PT:   OT:   RN:   Equipment:   Other:     LOS: 2 days   Liberty Handy, MD 11/20/2015, 9:04 AM

## 2015-11-20 NOTE — Progress Notes (Signed)
STROKE TEAM PROGRESS NOTE   HISTORY OF PRESENT ILLNESS Patrick [name] is a 70 y.o. male with hx of HTN on catapres patch. He has not been using the patch lately because he cannot afford it. In thsi setting went to ED a couple of days ago with headache and was worked up and sent home. He then developed a headache and came back to OSH ED where he was again found to be extremily hypertensive. The is a questionable report of there being minor right sided weakness but pateint denies this and tells me that he noramally has left leg pain. His legs were weak prior to coming to the hospital but this was a bialteral issue. HE was transferred to [facility] with concerns for PRES. However his MRI was negative.   SUBJECTIVE (INTERVAL HISTORY) The patient's son is at the bedside. The patient was dozing. No new complaints. Pending CUS.    OBJECTIVE Temp:  [98 F (36.7 C)-98.7 F (37.1 C)] 98.7 F (37.1 C) (04/23 1420) Pulse Rate:  [65-89] 82 (04/23 1420) Cardiac Rhythm:  [-] Atrial fibrillation (04/23 0800) Resp:  [11-18] 18 (04/23 1420) BP: (128-197)/(80-117) 128/80 mmHg (04/23 1500) SpO2:  [95 %-99 %] 99 % (04/23 1420) Weight:  [81.829 kg (180 lb 6.4 oz)] 81.829 kg (180 lb 6.4 oz) (04/23 0500)  CBC:   Recent Labs Lab 11/15/15 1143 11/18/15 1359  WBC 5.6 7.1  NEUTROABS  --  5.9  HGB  --  12.9*  HCT 39.2 40.6  MCV 79 80.1  PLT 196 [id number]    Basic Metabolic Panel:   Recent Labs Lab 11/18/15 1359 11/20/15 0424  NA 145 138  K 3.7 4.2  CL 105 103  CO2 20* 22  GLUCOSE 251* 195*  BUN 41* 31*  CREATININE 2.97* 2.49*  CALCIUM 9.8 8.9  MG  --  1.8  PHOS  --  2.9    Lipid Panel:     Component Value Date/Time   CHOL 128 11/20/2015 0424   TRIG 77 11/20/2015 0424   HDL 48 11/20/2015 0424   CHOLHDL 2.7 11/20/2015 0424   VLDL 15 11/20/2015 0424   LDLCALC 65 11/20/2015 0424   HgbA1c:  Lab Results  Component Value Date   HGBA1C 7.7 08/29/2015   Urine Drug Screen:     Component Value  Date/Time   LABOPIA NONE DETECTED 11/22/2010 0831   COCAINSCRNUR NONE DETECTED 11/22/2010 0831   LABBENZ NONE DETECTED 11/22/2010 0831   AMPHETMU NONE DETECTED 11/22/2010 0831   THCU NONE DETECTED 11/22/2010 0831   LABBARB  11/22/2010 0831    NONE DETECTED        DRUG SCREEN FOR MEDICAL PURPOSES ONLY.  IF CONFIRMATION IS NEEDED FOR ANY PURPOSE, NOTIFY LAB WITHIN 5 DAYS.        LOWEST DETECTABLE LIMITS FOR URINE DRUG SCREEN Drug Class       Cutoff (ng/mL) Amphetamine      1000 Barbiturate      200 Benzodiazepine   A999333 Tricyclics       [id number] Opiates          300 Cocaine          300 THC              50      IMAGING I have personally reviewed the radiological images below and agree with the radiology interpretations.  Ct Head Wo Contrast 11/18/2015   1. Stable, prominent CSF spaces bilaterally at the convexities.  2. Chronic lacunar infarct  of the left thalamus.  3.  No evidence for acute intracranial abnormality.   Mr Brain Wo Contrast 11/18/2015   1. No acute intracranial abnormality.  2. Chronic ischemic disease with mild progression in the posterior right MCA territory since 2012.  3. Chronic increased subarachnoid space CSF over both cerebral convexities. There may now be tiny superimposed subdural hygromas, but significance is doubtful.  CUS  11/20/2015 Preliminary findings: Bilateral: No significant (1-39%) ICA stenosis. Antegrade vertebral flow.   TTE  11/20/2015 Study Conclusions - Left ventricle: The cavity size was normal. Wall thickness was  increased in a pattern of moderate to severe LVH. Systolic  function was mildly to moderately reduced. The estimated ejection  fraction was in the range of 40% to 45%. Wall motion was normal;  there were no regional wall motion abnormalities. - Aortic valve: There was mild regurgitation. - Mitral valve: Calcified annulus. - Left atrium: The atrium was moderately dilated.   PHYSICAL EXAM  Temp:  [98 F (36.7  C)-98.7 F (37.1 C)] 98.7 F (37.1 C) (04/23 1420) Pulse Rate:  [65-89] 82 (04/23 1420) Resp:  [11-18] 18 (04/23 1420) BP: (128-197)/(80-117) 128/80 mmHg (04/23 1500) SpO2:  [95 %-99 %] 99 % (04/23 1420) Weight:  [81.829 kg (180 lb 6.4 oz)] 81.829 kg (180 lb 6.4 oz) (04/23 0500)  General - Well nourished, well developed, in no apparent distress, depressed mood.  Ophthalmologic - Fundi not visualized due to eye movement.  Cardiovascular - irregularly irregular heart rate and rhythm.  Mental Status -  Level of arousal and orientation to time, place, and person were intact. Language including expression, repetition, comprehension was assessed and found intact. Naming 2/3 Fund of Knowledge was assessed and was intact.  Cranial Nerves II - XII - II - Visual field intact OU. III, IV, VI - Extraocular movements intact. V - Facial sensation intact bilaterally. VII - Facial movement intact bilaterally. VIII - Hearing & vestibular intact bilaterally. X - Palate elevates symmetrically. XI - Chin turning & shoulder shrug intact bilaterally. XII - Tongue protrusion intact.  Motor Strength - The patient's strength was normal in all extremities and pronator drift was absent.  Bulk was normal and fasciculations were absent.   Motor Tone - Muscle tone was assessed at the neck and appendages and was normal.  Reflexes - The patient's reflexes were 1+ in all extremities and he had no pathological reflexes.  Sensory - Light touch, temperature/pinprick were assessed and were symmetrical.    Coordination - The patient had normal movements in the hands with no ataxia or dysmetria.  Tremor was absent.  Gait and Station - deferred due to safety concerns.    ASSESSMENT/PLAN [name] is a 70 y.o. male with history of hypertension, medical noncompliance secondary to finances,  chronic kidney disease, coronary artery disease with previous MI, gout, hyperlipidemia, previous stroke, diabetes  mellitus, history of upper GI bleed, atrial fibrillation on warfarin (INR 1.97), and OSA, presenting with severe hypertension and headache. He did not receive IV t-PA due to a negative MRI, late presentation, and warfarin therapy.  Hypertensive emergency  Resultant  resolution of deficits  MRI - negative for stroke  Carotid Doppler unremarkable   2D Echo - EF 40-45%. No cardiac source of emboli identified.  LDL - 65  HgbA1c pending  VTE prophylaxis - warfarin Diet heart healthy/carb modified Room service appropriate?: Yes; Fluid consistency:: Thin  aspirin 81 mg daily and warfarin daily prior to admission, now on aspirin 81 mg  daily and warfarin daily.  Patient counseled to be compliant with his antithrombotic medications  Ongoing aggressive stroke risk factor management  Therapy recommendations: CIR   Disposition: Pending  Chronic afib on coumadin  INR 1.97 on admission, today 3.2  INR daily monitoring  INR goal 2-3  Not DOAC candidate due to CKD  On cardiazem  History of stroke  Left thalamic 10/2010  No significant residue as per son  Hypertension  Blood pressure running mildly high  On cardene drip  Continue home po meds  Wean off cardene as able  Hyperlipidemia  Home meds:  Pravachol 80 mg daily resumed in hospital  LDL 65, goal < 70  Continue statin at discharge  Diabetes  HgbA1c pending, goal < 7.0  Uncontrolled  SSI  Management as per primary team  Other Stroke Risk Factors  Advanced age  Cigarette smoker, quit smoking 39 years ago.  Hx stroke/TIA  Coronary artery disease  Obstructive sleep apnea  Other Active Problems  Chronic kidney disease  Hospital day # 2  Neurology will sign off. Please call with questions. No neurology follow up needed at this time. Thanks for the consult.  Rosalin Hawking, MD PhD Stroke Neurology 11/20/2015 5:16 PM   To contact Stroke Continuity provider, please refer to [email or web address] After  hours, contact General Neurology

## 2015-11-20 NOTE — Evaluation (Signed)
Occupational Therapy Evaluation Patient Details Name: Patrick [name] MRN: [id number] DOB: Nov 30, 1945 Today's Date: 11/20/2015    History of Present Illness 70 y.o. male with past med hx HTN, CKD 3-4, a-fib on Coumadin, CVA, DM2 presenting for Headache for 1 wk and vomiting for a several days. He has also noted weakness in both legs. Upon arrival to the ED SBP was > 270 . He states he has been taking his medications appropriately but was not able to take them this AM due to vomiting.  PMH positive for Hypertension; CKD, stage II; CAD; Depression; Gout; Hyperlipidemia; CVA; GERD; Diabetes mellitus type II; Upper GI bleeding; Paroxysmal atrial fibrillation; Atrial fibrillation; Myocardial infarction; Shortness of breath; Sleep apnea; Stomach ulcer; Stroke; Permanent atrial fibrillation; Chronic anticoagulation, on coumadin; and Tachy-brady syndrome   Clinical Impression   Pt reports he was independent with ADLs and mobility PTA. Currently pt overall max assist for stand pivot transfer and min-max assist for ADLs. Pt c/o R thigh pain and generalized weakness in bil LEs; in standing pt reports he "can't move R leg" to assist with stand pivot transfer. Recommending CIR level therapies in order to maximize independence and safety with ADLs and functional mobility prior to return home. Pt would benefit from continued skilled OT to address established goals.     Follow Up Recommendations  CIR;Supervision/Assistance - 24 hour    Equipment Recommendations  Other (comment) (TBD)    Recommendations for Other Services Rehab consult     Precautions / Restrictions Precautions Precautions: Fall Restrictions Weight Bearing Restrictions: No      Mobility Bed Mobility Overal bed mobility: Needs Assistance Bed Mobility: Supine to Sit     Supine to sit: Min assist;HOB elevated     General bed mobility comments: Min assist for bil LEs. Increased time required. HOB elevated with use of bed  rail.  Transfers Overall transfer level: Needs assistance Equipment used: 1 person hand held assist Transfers: Sit to/from [facility] Sit to Stand: Mod assist Stand pivot transfers: Max assist       General transfer comment: Sit to stand from EOB x 2. Stand pivot from EOB to chair x 1. VCs for hand placement and technique.     Balance Overall balance assessment: Needs assistance Sitting-balance support: Feet supported;Single extremity supported Sitting balance-Leahy Scale: Fair     Standing balance support: Bilateral upper extremity supported Standing balance-Leahy Scale: Zero Standing balance comment: Max assist for balance in standing                            ADL Overall ADL's : Needs assistance/impaired Eating/Feeding: Set up;Sitting   Grooming: Min guard;Sitting;Set up   Upper Body Bathing: Minimal assitance;Sitting   Lower Body Bathing: Maximal assistance;Sit to/from stand;+2 for physical assistance   Upper Body Dressing : Minimal assistance;Sitting   Lower Body Dressing: Maximal assistance;+2 for safety/equipment;Sit to/from stand Lower Body Dressing Details (indicate cue type and reason): Pt unable to pull up socks in sitting secondary to increased pain in R thigh. Toilet Transfer: Maximal assistance;Stand-pivot;BSC (pt holding on to therapist bil forearms) Toilet Transfer Details (indicate cue type and reason): simulated by transfer from EOB to chair Toileting- Clothing Manipulation and Hygiene: Maximal assistance;+2 for physical assistance;Sit to/from stand       Functional mobility during ADLs: Maximal assistance (for stand pivot transfer) General ADL Comments: Pt with significant weakness in R LE; significant difficulty moving RLE in standing for stand pivot.  Vision Vision Assessment?: No apparent visual deficits   Perception     Praxis      Pertinent Vitals/Pain Pain Assessment: Faces Faces Pain Scale: Hurts little  more Pain Location: R thigh Pain Descriptors / Indicators: Grimacing;Guarding Pain Intervention(s): Limited activity within patient's tolerance;Monitored during session;Repositioned     Hand Dominance Right   Extremity/Trunk Assessment Upper Extremity Assessment Upper Extremity Assessment: Overall WFL for tasks assessed   Lower Extremity Assessment Lower Extremity Assessment: Defer to PT evaluation       Communication Communication Communication: No difficulties   Cognition Arousal/Alertness: Awake/alert Behavior During Therapy: Flat affect           Following Commands: Follows one step commands with increased time     Problem Solving: Slow processing     General Comments       Exercises       Shoulder Instructions      Home Living Family/patient expects to be discharged to:: Unsure Living Arrangements: Spouse/significant other Available Help at Discharge: Family Type of Home: Apartment Home Access: Level entry     Home Layout: One level     Bathroom Shower/Tub: Tub/shower unit Shower/tub characteristics: Charity fundraiser: Standard     Home Equipment: Bedside commode (wife has BSC)   Additional Comments: patient's wife just here last week due to stroke      Prior Functioning/Environment Level of Independence: Independent             OT Diagnosis: Generalized weakness;Acute pain   OT Problem List: Decreased strength;Decreased activity tolerance;Impaired balance (sitting and/or standing);Decreased safety awareness;Decreased knowledge of use of DME or AE;Decreased knowledge of precautions;Pain   OT Treatment/Interventions: Self-care/ADL training;Energy conservation;DME and/or AE instruction;Therapeutic activities;Patient/family education;Balance training    OT Goals(Current goals can be found in the care plan section) Acute Rehab OT Goals Patient Stated Goal: To get stronger OT Goal Formulation: With patient Time For Goal Achievement:  12/04/15 Potential to Achieve Goals: Good ADL Goals Pt Will Perform Grooming: with min guard assist;standing Pt Will Perform Upper Body Bathing: with set-up;with supervision;sitting Pt Will Perform Lower Body Bathing: with min assist;sit to/from stand Pt Will Transfer to Toilet: with min assist;ambulating;bedside commode (BSC over toilet) Pt Will Perform Toileting - Clothing Manipulation and hygiene: with min assist;sit to/from stand  OT Frequency: Min 2X/week   Barriers to D/C:            Co-evaluation              End of Session Equipment Utilized During Treatment: Gait belt Nurse Communication: Mobility status;Other (comment) (pt up in chair with chair alarm on)  Activity Tolerance: Patient tolerated treatment well Patient left: in chair;with call bell/phone within reach;with chair alarm set;with family/visitor present   Time: [id number] OT Time Calculation (min): 24 min Charges:  OT General Charges $OT Visit: 1 Procedure OT Evaluation $OT Eval Moderate Complexity: 1 Procedure OT Treatments $Therapeutic Activity: 8-22 mins G-Codes:     Binnie Kand M.S., OTR/L Pager: [phone]  11/20/2015, 2:58 PM

## 2015-11-20 NOTE — Progress Notes (Signed)
ANTICOAGULATION CONSULT NOTE - Follow Up Consult  Pharmacy Consult for warfarin Indication: atrial fibrillation  No Known Allergies  Patient Measurements: Height: 5\' 9"  (175.3 cm) Weight: 180 lb 6.4 oz (81.829 kg) IBW/kg (Calculated) : 70.7  Vital Signs: Temp: 98.4 F (36.9 C) (04/23 1007) Temp Source: Oral (04/23 1007) BP: 183/109 mmHg (04/23 1007) Pulse Rate: 85 (04/23 1007)  Labs:  Recent Labs  11/18/15 1359 11/20/15 0424  HGB 12.9*  --   HCT 40.6  --   PLT 241  --   APTT 35  --   LABPROT 21.6* 32.1*  INR 1.97* 3.20*  CREATININE 2.97* 2.49*  TROPONINI  --  0.09*    Estimated Creatinine Clearance: 28.8 mL/min (by C-G formula based on Cr of 2.49).  Assessment: 70 yo male presenting from [facility] with HTN emergency - HA, vomiting, weakness in BL legs x 1 week  PMH: HTN, Afib on warfarin, CKD 3, DM2  AC: warfarin pta for a fib. INR 3.2  PTA warfarin 5 mg daily  Renal: SCr 2.49  Heme: H&H 12.9/40.6, Plt 241  Goal of Therapy:  INR 2-3 Monitor platelets by anticoagulation protocol: Yes   Plan:  Warfarin 2.5 mg x 1 tonight Daily INR, CBC q72h Monitor s/sx of bleeding  Levester Fresh, PharmD, BCPS, [facility] Clinical Pharmacist Pager [phone] 11/20/2015 10:52 AM

## 2015-11-20 NOTE — Progress Notes (Signed)
Internal Medicine Attending  Date: 11/20/2015  Patient name: Patrick [name] Medical record number: [id number] Date of birth: Nov 05, 1945 Age: 69 y.o. Gender: male  I saw and evaluated the patient. I reviewed the resident's note by Dr. Marijean Bravo and I agree with the resident's findings and plans as documented in his progress note.  [name] was admitted for a hypertensive emergency. This is likely related to missing several clonidine doses he was busy caring for his wife who just had a stroke. Fortunately, Ms. Moehring says his wife is doing better. [name] is also doing better from a blood pressure standpoint and we are slowly reintroducing his antihypertensives. It would be ideal to slowly wean off the clonidine if possible and treat with alternative antihypertensives. Currently we are waiting for a CIR evaluation, but anticipate he will be ready for discharge home or transfer to CIR soon thereafter.

## 2015-11-21 ENCOUNTER — Encounter (HOSPITAL_COMMUNITY): Payer: Self-pay | Admitting: Physical Medicine and Rehabilitation

## 2015-11-21 ENCOUNTER — Inpatient Hospital Stay (HOSPITAL_COMMUNITY): Payer: Medicare Other

## 2015-11-21 DIAGNOSIS — I6930 Unspecified sequelae of cerebral infarction: Secondary | ICD-10-CM

## 2015-11-21 DIAGNOSIS — N179 Acute kidney failure, unspecified: Secondary | ICD-10-CM

## 2015-11-21 DIAGNOSIS — R791 Abnormal coagulation profile: Secondary | ICD-10-CM

## 2015-11-21 DIAGNOSIS — R531 Weakness: Secondary | ICD-10-CM

## 2015-11-21 DIAGNOSIS — N183 Chronic kidney disease, stage 3 (moderate): Secondary | ICD-10-CM

## 2015-11-21 DIAGNOSIS — I5032 Chronic diastolic (congestive) heart failure: Secondary | ICD-10-CM

## 2015-11-21 DIAGNOSIS — G441 Vascular headache, not elsewhere classified: Secondary | ICD-10-CM

## 2015-11-21 DIAGNOSIS — I482 Chronic atrial fibrillation: Secondary | ICD-10-CM

## 2015-11-21 DIAGNOSIS — E113419 Type 2 diabetes mellitus with severe nonproliferative diabetic retinopathy with macular edema, unspecified eye: Secondary | ICD-10-CM

## 2015-11-21 DIAGNOSIS — Z8673 Personal history of transient ischemic attack (TIA), and cerebral infarction without residual deficits: Secondary | ICD-10-CM | POA: Insufficient documentation

## 2015-11-21 DIAGNOSIS — I169 Hypertensive crisis, unspecified: Secondary | ICD-10-CM

## 2015-11-21 DIAGNOSIS — I10 Essential (primary) hypertension: Secondary | ICD-10-CM

## 2015-11-21 DIAGNOSIS — Z79899 Other long term (current) drug therapy: Secondary | ICD-10-CM

## 2015-11-21 DIAGNOSIS — R51 Headache: Secondary | ICD-10-CM

## 2015-11-21 DIAGNOSIS — Z7901 Long term (current) use of anticoagulants: Secondary | ICD-10-CM

## 2015-11-21 LAB — BASIC METABOLIC PANEL
Anion gap: 12 (ref 5–15)
BUN: 27 mg/dL — AB (ref 6–20)
CALCIUM: 9 mg/dL (ref 8.9–10.3)
CHLORIDE: 101 mmol/L (ref 101–111)
CO2: 24 mmol/L (ref 22–32)
CREATININE: 2.33 mg/dL — AB (ref 0.61–1.24)
GFR calc non Af Amer: 27 mL/min — ABNORMAL LOW (ref >60)
GFR, EST AFRICAN AMERICAN: 32 mL/min — AB (ref >60)
Glucose, Bld: 199 mg/dL — ABNORMAL HIGH (ref 65–99)
Potassium: 3.8 mmol/L (ref 3.5–5.1)
SODIUM: 137 mmol/L (ref 135–145)

## 2015-11-21 LAB — PHOSPHORUS: Phosphorus: 2.9 mg/dL (ref 2.5–4.6)

## 2015-11-21 LAB — GLUCOSE, CAPILLARY
GLUCOSE-CAPILLARY: 188 mg/dL — AB (ref 65–99)
GLUCOSE-CAPILLARY: 214 mg/dL — AB (ref 65–99)
GLUCOSE-CAPILLARY: 205 mg/dL — AB (ref 65–99)
GLUCOSE-CAPILLARY: 143 mg/dL — AB (ref 65–99)
Glucose-Capillary: 192 mg/dL — ABNORMAL HIGH (ref 65–99)

## 2015-11-21 LAB — CK: Total CK: 67 U/L (ref 49–397)

## 2015-11-21 LAB — MAGNESIUM: Magnesium: 1.7 mg/dL (ref 1.7–2.4)

## 2015-11-21 LAB — PROTIME-INR
INR: 3.22 — AB (ref 0.00–1.49)
PROTHROMBIN TIME: 32.3 s — AB (ref 11.6–15.2)

## 2015-11-21 MED ORDER — INSULIN ASPART 100 UNIT/ML ~~LOC~~ SOLN
0.0000 [IU] | Freq: Three times a day (TID) | SUBCUTANEOUS | Status: DC
  Administered 2015-11-21 – 2015-11-22 (×3): 4 [IU] via SUBCUTANEOUS

## 2015-11-21 MED ORDER — ACETAMINOPHEN 500 MG PO TABS
1000.0000 mg | ORAL_TABLET | Freq: Four times a day (QID) | ORAL | Status: DC | PRN
  Administered 2015-11-21 – 2015-11-22 (×4): 1000 mg via ORAL
  Filled 2015-11-21 (×4): qty 2

## 2015-11-21 MED ORDER — DICLOFENAC SODIUM 1 % TD GEL
4.0000 g | Freq: Four times a day (QID) | TRANSDERMAL | Status: DC
  Administered 2015-11-21 – 2015-11-22 (×4): 4 g via TOPICAL
  Filled 2015-11-21: qty 100

## 2015-11-21 MED ORDER — HYDRALAZINE HCL 20 MG/ML IJ SOLN
5.0000 mg | Freq: Four times a day (QID) | INTRAMUSCULAR | Status: DC | PRN
  Administered 2015-11-21 – 2015-11-22 (×2): 5 mg via INTRAVENOUS
  Filled 2015-11-21 (×2): qty 1

## 2015-11-21 MED ORDER — HYDROCHLOROTHIAZIDE 12.5 MG PO CAPS
12.5000 mg | ORAL_CAPSULE | Freq: Every day | ORAL | Status: DC
  Administered 2015-11-21: 12.5 mg via ORAL
  Filled 2015-11-21: qty 1

## 2015-11-21 MED ORDER — LISINOPRIL 10 MG PO TABS
10.0000 mg | ORAL_TABLET | Freq: Once | ORAL | Status: AC
  Administered 2015-11-21: 10 mg via ORAL
  Filled 2015-11-21: qty 1

## 2015-11-21 MED ORDER — SERTRALINE HCL 50 MG PO TABS
25.0000 mg | ORAL_TABLET | Freq: Every day | ORAL | Status: DC
  Filled 2015-11-21: qty 1

## 2015-11-21 MED ORDER — WARFARIN SODIUM 1 MG PO TABS
1.0000 mg | ORAL_TABLET | Freq: Once | ORAL | Status: AC
  Administered 2015-11-21: 1 mg via ORAL
  Filled 2015-11-21: qty 1

## 2015-11-21 MED ORDER — FUROSEMIDE 20 MG PO TABS
20.0000 mg | ORAL_TABLET | Freq: Two times a day (BID) | ORAL | Status: DC
  Administered 2015-11-21: 20 mg via ORAL
  Filled 2015-11-21: qty 1

## 2015-11-21 MED ORDER — INSULIN ASPART 100 UNIT/ML ~~LOC~~ SOLN: 0.0000 [IU] | Freq: Every day | SUBCUTANEOUS | Status: DC

## 2015-11-21 MED ORDER — LISINOPRIL 20 MG PO TABS
20.0000 mg | ORAL_TABLET | Freq: Every day | ORAL | Status: DC
Start: 2015-11-22 — End: 2015-11-22

## 2015-11-21 NOTE — Consult Note (Addendum)
Physical Medicine and Rehabilitation Consult  Reason for Consult: Weakness Referring Physician: Dr. Chase Caller.    HPI: Patrick [name] is a 70 y.o. male with history of CKD, HTN, A fib, DMT2 who was admitted on 11/18/15 with hypertensive emergency, HA, weakness and minor right sided weakness. He was admitted due to concerns of PRES. Patient had missed multiple BP doses due to wife being in the hospital. Work up showed acute on chronic renal insufficiency and INR -1.97 at admission. He was started on cardene drip and home medications resumed. 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  MRI brain negative. Carotid dopplers without significant ICA stenosis. 2 D echo with EF 40-45% and no wall abnormality.   Work up negative and neurology has signed off.  Therapy evaluations done and CIR recommended due to generalized weakness.    Review of Systems  HENT: Negative for hearing loss.   Eyes: Negative for blurred vision and double vision.  Respiratory: Negative for cough and shortness of breath.   Cardiovascular: Negative for chest pain and palpitations.  Gastrointestinal: Negative for heartburn, nausea and abdominal pain.  Genitourinary: Negative for dysuria and urgency.  Musculoskeletal: Positive for joint pain (right > left leg pain--at hips?). Negative for myalgias and falls.  Neurological: Positive for focal weakness and weakness. Negative for speech change and headaches.  Psychiatric/Behavioral: Positive for memory loss.  All other systems reviewed and are negative.   Past Medical History  Diagnosis Date  . Hypertension   . CKD (chronic kidney disease), stage II   . CAD (coronary artery disease)   . Depression   . Gout   . Hyperlipidemia   . 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 GERD (gastroesophageal reflux disease)   . Diabetes mellitus type II     Insulin dependent  . Upper GI bleeding 07/01/2013  . Paroxysmal atrial  fibrillation (HCC)     on coumadin  . Atrial fibrillation ([place])   . Myocardial infarction [facility]) 2000's    "near heart attack" (07/14/2013)  . Shortness of breath     "can happen at any time" (07/14/2013)  . Sleep apnea 07/2010    "has mask at home; seldom uses it" (07/14/2013)  . Stomach ulcer 1950's    "as a teenager"  . Stroke [facility]) ~ 2007; ~ 2009    "memory not as good since" (07/14/2013)  . Permanent atrial fibrillation ([place]) 07/17/2013  . Chronic anticoagulation, on coumadin 07/17/2013  . Tachy-brady syndrome ([place]) 07/17/2013    Past Surgical History  Procedure Laterality Date  . Cardiac catheterization  07/2003    /encounter notes 08/27/2005 (07/01/2013)    Family History  Problem Relation Age of Onset  . Diabetes Mother   . Hypertension Mother   . Heart Problems Father   . Cancer Brother   . Hypertension Sister   . Diabetes Sister       Social History:  Married. Independent PTA.  He  reports that he quit smoking about 39 years ago. His smoking use included Cigarettes. He has a 7.5 pack-year smoking history. He has quit using smokeless tobacco. He reports that he does not drink alcohol--.quit 39 years ago.  He reports that he does not use illicit drugs.    Allergies: No Known Allergies    Medications Prior to Admission  Medication Sig Dispense Refill  . allopurinol (ZYLOPRIM) 100 MG tablet TAKE TWO TABLETS BY MOUTH ONCE DAILY 60 tablet  0  . aspirin 81 MG EC tablet Take 81 mg by mouth daily.      . cloNIDine (CATAPRES) 0.3 MG tablet Take 1 tablet (0.3 mg total) by mouth 3 (three) times daily. 90 tablet 5  . diltiazem (CARDIZEM) 30 MG tablet Take 30 mg by mouth 3 (three) times daily.    . furosemide (LASIX) 20 MG tablet Take 1 tablet (20 mg total) by mouth 2 (two) times daily. (Patient taking differently: Take 20 mg by mouth daily. ) 60 tablet 5  . lisinopril (PRINIVIL,ZESTRIL) 10 MG tablet Take 1 tablet (10 mg total) by mouth daily. 90 tablet 0  . metFORMIN  (GLUCOPHAGE-XR) 500 MG 24 hr tablet TAKE 2 TABLETS BY MOUTH WITH BREAKFAST 60 tablet 0  . pravastatin (PRAVACHOL) 80 MG tablet Take 1 tablet (80 mg total) by mouth daily. 90 tablet 3  . warfarin (COUMADIN) 5 MG tablet Take 1 tablet (5 mg total) by mouth daily at 6 PM. 30 tablet 1  . Blood Glucose Monitoring Suppl (ACCU-CHEK AVIVA PLUS) W/DEVICE KIT 1 each by Does not apply route as needed. 1 kit 0  . diltiazem (DILACOR XR) 120 MG 24 hr capsule Take 1 capsule (120 mg total) by mouth daily. 30 capsule 1  . glucose blood (TRUETRACK TEST) test strip Use to test blood sugar 2-3 times daily dx code 250.00 100 each 11  . Lancets 30G MISC Use to test blood glucose 2-3 times daily dx code 250.00 100 each 11    Home: Home Living Family/patient expects to be discharged to:: Unsure Living Arrangements: Spouse/significant other Available Help at Discharge: Family Type of Home: Apartment Home Access: Level entry Home Layout: One level Bathroom Shower/Tub: Chiropodist: Standard Home Equipment: Bedside commode (wife has BSC) Additional Comments: patient's wife just here last week due to stroke  Functional History: Prior Function Level of Independence: Independent Functional Status:  Mobility: Bed Mobility Overal bed mobility: Needs Assistance Bed Mobility: Supine to Sit Supine to sit: Min assist, HOB elevated General bed mobility comments: Min assist for bil LEs. Increased time required. HOB elevated with use of bed rail. Transfers Overall transfer level: Needs assistance Equipment used: 1 person hand held assist Transfers: Sit to/from Stand, Stand Pivot Transfers Sit to Stand: Mod assist Stand pivot transfers: Max assist General transfer comment: Sit to stand from EOB x 2. Stand pivot from EOB to chair x 1. VCs for hand placement and technique.  Ambulation/Gait Ambulation/Gait assistance: Mod assist, Max assist, +2 safety/equipment Ambulation Distance (Feet): 10  Feet Assistive device: Rolling walker (2 wheeled) Gait Pattern/deviations: Step-to pattern, Decreased stride length, Shuffle, Trunk flexed, Decreased dorsiflexion - left, Decreased dorsiflexion - right General Gait Details: initially needed facilitation for weight shift to allow LE progression, at times needing assist to progress R foot due to dragging on floor, then able to sequence and weight shift with less assist once started walking    ADL: ADL Overall ADL's : Needs assistance/impaired Eating/Feeding: Set up, Sitting Grooming: Min guard, Sitting, Set up Upper Body Bathing: Minimal assitance, Sitting Lower Body Bathing: Maximal assistance, Sit to/from stand, +2 for physical assistance Upper Body Dressing : Minimal assistance, Sitting Lower Body Dressing: Maximal assistance, +2 for safety/equipment, Sit to/from stand Lower Body Dressing Details (indicate cue type and reason): Pt unable to pull up socks in sitting secondary to increased pain in R thigh. Toilet Transfer: Maximal assistance, Stand-pivot, BSC (pt holding on to therapist bil forearms) Toilet Transfer Details (indicate cue type and reason):  simulated by transfer from EOB to chair Toileting- Clothing Manipulation and Hygiene: Maximal assistance, +2 for physical assistance, Sit to/from stand Functional mobility during ADLs: Maximal assistance (for stand pivot transfer) General ADL Comments: Pt with significant weakness in R LE; significant difficulty moving RLE in standing for stand pivot.  Cognition: Cognition Overall Cognitive Status: Impaired/Different from baseline Orientation Level: Oriented X4 Cognition Arousal/Alertness: Awake/alert Behavior During Therapy: Flat affect Overall Cognitive Status: Impaired/Different from baseline Area of Impairment: Following commands, Problem solving, Attention Current Attention Level: Sustained Following Commands: Follows one step commands with increased time Problem Solving: Slow  processing  Blood pressure 180/120, pulse 54, temperature 98.3 F (36.8 C), temperature source Oral, resp. rate 20, height '5\' 9"'  (1.753 m), weight 79.833 kg (176 lb), SpO2 98 %. Physical Exam  Vitals reviewed. Constitutional: He appears well-developed and well-nourished.  HENT:  Head: Normocephalic and atraumatic.  Eyes: EOM are normal. Right eye exhibits no discharge. Left eye exhibits no discharge.  Neck: Normal range of motion. Neck supple.  Cardiovascular:  Irregularly irregular  Respiratory: Effort normal and breath sounds normal.  GI: Soft. Bowel sounds are normal.  Musculoskeletal: He exhibits no edema or tenderness.  Neurological:  Fatigued Alert and oriented 2 with cues DTRs symmetric Motor: Right upper extremity: 4+/5 proximal to distal Left upper extremity: 4+/5 proximal to distal Right lower extremity: Hip flexion 2/5, knee extension 3/5, ankle dorsi/plantar flexion 2/5 Left lower extremity: 4+/5 proximal to distal  Skin: Skin is warm and dry.  Psychiatric: His affect is blunt. His speech is delayed. He is slowed. Cognition and memory are impaired.    Results for orders placed or performed during the hospital encounter of 11/18/15 (from the past 24 hour(s))  Troponin I     Status: Abnormal   Collection Time: 11/20/15 11:26 AM  Result Value Ref Range   Troponin I 0.10 (H) <0.031 ng/mL  Glucose, capillary     Status: Abnormal   Collection Time: 11/20/15 12:06 PM  Result Value Ref Range   Glucose-Capillary 181 (H) 65 - 99 mg/dL  Glucose, capillary     Status: Abnormal   Collection Time: 11/20/15  4:28 PM  Result Value Ref Range   Glucose-Capillary 191 (H) 65 - 99 mg/dL  Troponin I     Status: Abnormal   Collection Time: 11/20/15  7:55 PM  Result Value Ref Range   Troponin I 0.09 (H) <0.031 ng/mL  Glucose, capillary     Status: Abnormal   Collection Time: 11/20/15  9:21 PM  Result Value Ref Range   Glucose-Capillary 111 (H) 65 - 99 mg/dL   Comment 1 Notify RN     Comment 2 Document in Chart   Glucose, capillary     Status: Abnormal   Collection Time: 11/20/15 10:17 PM  Result Value Ref Range   Glucose-Capillary 167 (H) 65 - 99 mg/dL   Comment 1 Notify RN    Comment 2 Document in Chart   Basic metabolic panel     Status: Abnormal   Collection Time: 11/21/15  2:10 AM  Result Value Ref Range   Sodium 137 135 - 145 mmol/L   Potassium 3.8 3.5 - 5.1 mmol/L   Chloride 101 101 - 111 mmol/L   CO2 24 22 - 32 mmol/L   Glucose, Bld 199 (H) 65 - 99 mg/dL   BUN 27 (H) 6 - 20 mg/dL   Creatinine, Ser 2.33 (H) 0.61 - 1.24 mg/dL   Calcium 9.0 8.9 - 10.3 mg/dL  GFR calc non Af Amer 27 (L) >60 mL/min   GFR calc Af Amer 32 (L) >60 mL/min   Anion gap 12 5 - 15  Protime-INR     Status: Abnormal   Collection Time: 11/21/15  2:10 AM  Result Value Ref Range   Prothrombin Time 32.3 (H) 11.6 - 15.2 seconds   INR 3.22 (H) 0.00 - 1.49  Magnesium     Status: None   Collection Time: 11/21/15  2:10 AM  Result Value Ref Range   Magnesium 1.7 1.7 - 2.4 mg/dL  Phosphorus     Status: None   Collection Time: 11/21/15  2:10 AM  Result Value Ref Range   Phosphorus 2.9 2.5 - 4.6 mg/dL  Glucose, capillary     Status: Abnormal   Collection Time: 11/21/15  6:41 AM  Result Value Ref Range   Glucose-Capillary 188 (H) 65 - 99 mg/dL   Comment 1 Notify RN    Comment 2 Document in Chart    No results found.  Assessment/Plan: Diagnosis: Weakness Labs and images independently reviewed.  Records reviewed and summated above.  1. Does the need for close, 24 hr/day medical supervision in concert with the patient's rehab needs make it unreasonable for this patient to be served in a less intensive setting? Potentially  2. Co-Morbidities requiring supervision/potential complications:  acute on chronic renal insufficiency (avoid nephrotoxic meds), uncontrolled HTN ( optimize meds, monitor and provide prns in accordance with increased physical exertion and pain), A fib (monitor  heart rate with increased physical activity), continue meds, DMT2 (Monitor in accordance with exercise and adjust meds as necessary), HA (ensure pain does not limit progress in therapies), chorinic left thalamus infarct, supra therapeutic INR (optimize Coumadin), diastolic CHF (monitor weight) 3. Due to safety, disease management, medication administration and patient education, does the patient require 24 hr/day rehab nursing? Yes 4. Does the patient require coordinated care of a physician, rehab nurse, PT (1-2 hrs/day, 5 days/week) and OT (1-2 hrs/day, 5 days/week) to address physical and functional deficits in the context of the above medical diagnosis(es)? Yes Addressing deficits in the following areas: balance, endurance, locomotion, strength, transferring, bathing, dressing, toileting and psychosocial support 5. Can the patient actively participate in an intensive therapy program of at least 3 hrs of therapy per day at least 5 days per week? Potentially 6. The potential for patient to make measurable gains while on inpatient rehab is excellent 7. Anticipated functional outcomes upon discharge from inpatient rehab are supervision and min assist  with PT, supervision and min assist with OT, supervision with SLP. 8. Estimated rehab length of stay to reach the above functional goals is: 14-18 days. 9. Does the patient have adequate social supports and living environment to accommodate these discharge functional goals? Potentially 10. Anticipated D/C setting: Home 11. Anticipated post D/C treatments: HH therapy and Home excercise program 12. Overall Rehab/Functional Prognosis: good  RECOMMENDATIONS: This patient's condition is appropriate for continued rehabilitative care in the following setting: Currently, the patient does not have a diagnosis that is amenable to IRF. In addition. Patient is not able to medically tolerate 3 hours of therapy per day at present. Patient has agreed to participate in  recommended program. Potentially Note that insurance prior authorization may be required for reimbursement for recommended care.  Addendum: Repeat MRI showing bilateral scattered infarcts.  Will consider admission to CIR once medically appropriate (particulary BP and HR).  Would also consider further workup for RLE weakness.  Comment: Delice Lesch, MD  Delice Lesch, MD 11/21/2015

## 2015-11-21 NOTE — Care Management Note (Signed)
Case Management Note  Patient Details  Name: Patrick [name] MRN: [id number] Date of Birth: 1946-06-12  Subjective/Objective:                    Action/Plan: Continued medical workup. PT/OT recommends CIR. CIR does not feel he is appropriate. CM following for discharge needs.   Expected Discharge Date:                  Expected Discharge Plan:  [place]  In-House Referral:     Discharge planning Services     Post Acute Care Choice:    Choice offered to:     DME Arranged:    DME Agency:     HH Arranged:    [place] Agency:     Status of Service:  In process, will continue to follow  Medicare Important Message Given:    Date Medicare IM Given:    Medicare IM give by:    Date Additional Medicare IM Given:    Additional Medicare Important Message give by:     If discussed at [place] of Stay Meetings, dates discussed:    Additional Comments:  Pollie Friar, RN 11/21/2015, 3:40 PM

## 2015-11-21 NOTE — Progress Notes (Signed)
Subjective:  Patient says his headache and lower extremity weakness is worse than yesterday. He denies any chest pain for vision changes. He is agreeable to being evaluated for CIR. Patient's son says his mood is worse today. He denies any chest pain, dyspnea, or vision changes.   Objective: Vital signs in last 24 hours: Filed Vitals:   11/21/15 0604 11/21/15 0939 11/21/15 1018 11/21/15 1043  BP: 165/110 249/125 189/129 180/120  Pulse: 84 54    Temp: 98.5 F (36.9 C) 98.3 F (36.8 C)    TempSrc: Oral Oral    Resp: 20 20    Height:      Weight:      SpO2: 100% 98%     Weight change: -4 lb 6.4 oz (-1.996 kg)  Intake/Output Summary (Last 24 hours) at 11/21/15 1103 Last data filed at 11/21/15 0610  Gross per 24 hour  Intake    240 ml  Output    100 ml  Net    140 ml   Physical Exam General: Lying in bed, appearing uncomfortable Cardiovascular: Irregularly irregular rhythm, no murmurs, rubs, or gallops. Pulmonary: CTAB, unlabored breathing Abdominal: Soft, NT/ND. Normal bowel sounds Extremities: No clubbing, cyanosis, or edema Neurological: Speech normal except as below (see psychiatric). AAOx3 (he did not know the President today). EOMI, 5/5 strength in left extremities, 4+/5 strength in LLE and LUE Psychiatric: Blunted affect with minimal spontaneous speech.   Lab Results: Basic Metabolic Panel:  Recent Labs Lab 11/20/15 0424 11/21/15 0210  NA 138 137  K 4.2 3.8  CL 103 101  CO2 22 24  GLUCOSE 195* 199*  BUN 31* 27*  CREATININE 2.49* 2.33*  CALCIUM 8.9 9.0  MG 1.8 1.7  PHOS 2.9 2.9   Liver Function Tests:  Recent Labs Lab 11/15/15 1143 11/18/15 1359  AST 13 23  ALT 11 13*  ALKPHOS 85 76  BILITOT 0.6 1.0  PROT 7.1 7.8  ALBUMIN 4.3 4.4   CBC:  Recent Labs Lab 11/15/15 1143 11/18/15 1359  WBC 5.6 7.1  NEUTROABS  --  5.9  HGB  --  12.9*  HCT 39.2 40.6  MCV 79 80.1  PLT 196 241    CBG:  Recent Labs Lab 11/20/15 0620 11/20/15 1206  11/20/15 1628 11/20/15 2121 11/20/15 2217 11/21/15 0641  GLUCAP 191* 181* 191* 111* 167* 188*   Coagulation:  Recent Labs Lab 11/14/15 1216 11/18/15 1359 11/20/15 0424 11/21/15 0210  LABPROT  --  21.6* 32.1* 32.3*  INR 3.00 1.97* 3.20* 3.22*   Urinalysis:  Recent Labs Lab 11/18/15 1701  COLORURINE YELLOW  LABSPEC 1.014  PHURINE 6.5  GLUCOSEU 100*  HGBUR SMALL*  BILIRUBINUR NEGATIVE  KETONESUR NEGATIVE  PROTEINUR >300*  NITRITE NEGATIVE  LEUKOCYTESUR NEGATIVE    Studies/Results: No results found. Medications: I have reviewed the patient's current medications. Scheduled Meds: . allopurinol  200 mg Oral Daily  . aspirin EC  81 mg Oral Daily  . cloNIDine  0.3 mg Oral TID  . diltiazem  120 mg Oral Daily  . furosemide  20 mg Oral BID  . insulin aspart  0-15 Units Subcutaneous TID WC  . insulin aspart  0-5 Units Subcutaneous QHS  . lisinopril  10 mg Oral Once  . [START ON 11/22/2015] lisinopril  20 mg Oral Daily  . polyethylene glycol  17 g Oral Daily  . pravastatin  80 mg Oral q1800  . warfarin  1 mg Oral ONCE-1800  . Warfarin - Pharmacist Dosing Inpatient  Does not apply q1800   Continuous Infusions:  PRN Meds:.acetaminophen, hydrALAZINE, senna-docusate Assessment/Plan:  Hypertensive Emergency secondary to Clonidine Withdrawal: Patient's blood pressures continues to be erratic and still has a headache. We will augment his blood pressure regimen. - Lisinopril 20 mg daily - Clonidine 0.3 mg TID - Furosemide 20 mg daily - Diltiazem 120 mg daily - ASA 81 mg - From stroke workup - TCD, A1c, LDL, TTE, Carotid doppers pending  Chronic Atrial Fibrillation (CHA2DS2-VASc 6): Despite non-adherence, INR has been near therapeutic range. INR supratherapeutic at 3.22 this AM. - Diltiazem as above - Warfarin per pharm  T2DM: On metformin 24 hr tablet 1000 mg daily at home. CBGs have been in 200s. - Change SSI from moderate to resistant  DVT PPx: On  Warfarin  Dispo: Disposition is deferred at this time, awaiting improvement of current medical problems.  Anticipated discharge in approximately 1-2 day(s). Awaiting evaluation for CIR.  The patient does have a current PCP Milagros Loll, MD) and does need an [facility] hospital follow-up appointment after discharge.  The patient does have transportation limitations that hinder transportation to clinic appointments.  .Services Needed at time of discharge: Y = Yes, Blank = No PT:   OT:   RN:   Equipment:   Other:     LOS: 3 days   Liberty Handy, MD 11/21/2015, 11:03 AM

## 2015-11-21 NOTE — Progress Notes (Signed)
Patient seen and examined. Case d/w residents in detail. I agree with findings and plan as documented in Dr. Barbera Setters note.  Patient complains of recurrent HA and notes worsening weakness today especially on his right side. His SBP was again noted to be in the 200s. We have restarted his clonidine and  Lisinopril. Will increase his lisinopril to 20 mg and have started him on HCTZ as well for BP control.  PT recommending CIR. PMNR follow up appreciated. Given his worsening weakness would consider re imaging patient if this persists despite his BP being better controlled.

## 2015-11-21 NOTE — Progress Notes (Signed)
Physical Therapy Treatment Patient Details Name: Patrick [name] MRN: [id number] DOB: Jun 09, 1946 Today's Date: 11/21/2015    History of Present Illness 70 y.o. male with past med hx HTN, CKD 3-4, a-fib on Coumadin, CVA, DM2 presenting for Headache for 1 wk and vomiting for a several days. He has also noted weakness in both legs. Upon arrival to the ED SBP was > 270 . He states he has been taking his medications appropriately but was not able to take them this AM due to vomiting.  PMH positive for Hypertension; CKD, stage II; CAD; Depression; Gout; Hyperlipidemia; CVA; GERD; Diabetes mellitus type II; Upper GI bleeding; Paroxysmal atrial fibrillation; Atrial fibrillation; Myocardial infarction; Shortness of breath; Sleep apnea; Stomach ulcer; Stroke; Permanent atrial fibrillation; Chronic anticoagulation, on coumadin; and Tachy-brady syndrome    PT Comments    Patient reports headache today and session limited due to elevated BP.  Pt demonstrates impaired sitting balance with LOB to the right without support. Requires assist of 2 to stand from elevated surface and to perform SPT due to right knee buckling. Cues for weight shifting and advancement of RLE to get to chair. Appropriate for CIR. Will continue to follow per current POC.   Follow Up Recommendations  CIR     Equipment Recommendations  Rolling walker with 5" wheels    Recommendations for Other Services       Precautions / Restrictions Precautions Precautions: Fall Restrictions Weight Bearing Restrictions: No    Mobility  Bed Mobility Overal bed mobility: Needs Assistance Bed Mobility: Supine to Sit     Supine to sit: Max assist;Mod assist;HOB elevated     General bed mobility comments: Assist with RLE and to elevate trunk to get to EOB. Use of bed rail for support. Increased time.  Transfers Overall transfer level: Needs assistance Equipment used: Rolling walker (2 wheeled) Transfers: Sit to/from Stand Sit to Stand: Mod  assist;+2 physical assistance;From elevated surface         General transfer comment: Assist of 2 to stand from EOB with cues for hand placement, technique with therapist stabilizing RLE. Right knee instability in standing. SPT bed to chair with max A of 2. Difficulty advancing RLE and mobilizing LLE due to right knee buckling.  Ambulation/Gait Ambulation/Gait assistance:  (Deferred secondary to elevated BP - 180/129 manually.)               Stairs            Wheelchair Mobility    Modified Rankin (Stroke Patients Only) Modified Rankin (Stroke Patients Only) Pre-Morbid Rankin Score: Slight disability Modified Rankin: Moderately severe disability     Balance Overall balance assessment: Needs assistance Sitting-balance support: Feet supported;No upper extremity supported Sitting balance-Leahy Scale: Poor Sitting balance - Comments: Mod A initially with moments of Min guard with LOB to the right. Able to self correct with cues inconsistently.    Standing balance support: Bilateral upper extremity supported Standing balance-Leahy Scale: Poor Standing balance comment: Max A of 2 for standing balance with therapist blocking right knee for support.                     Cognition Arousal/Alertness: Awake/alert Behavior During Therapy: Flat affect Overall Cognitive Status: Impaired/Different from baseline Area of Impairment: Problem solving   Current Attention Level: Sustained   Following Commands: Follows one step commands with increased time     Problem Solving: Slow processing      Exercises General Exercises - Lower Extremity Ankle  Circles/Pumps: AAROM;Both;10 reps;Seated Quad Sets: PROM;Right;5 reps;Seated    General Comments General comments (skin integrity, edema, etc.): Son present during session.      Pertinent Vitals/Pain Pain Assessment: Faces Faces Pain Scale: Hurts whole lot Pain Location: headache Pain Descriptors / Indicators:  Grimacing;Headache Pain Intervention(s): Monitored during session;Repositioned;Limited activity within patient's tolerance    Home Living                      Prior Function            PT Goals (current goals can now be found in the care plan section) Progress towards PT goals: Not progressing toward goals - comment (secondary to elevated BP and HA.)    Frequency  Min 4X/week    PT Plan Current plan remains appropriate    Co-evaluation             End of Session Equipment Utilized During Treatment: Gait belt Activity Tolerance: Patient tolerated treatment well Patient left: in chair;with call bell/phone within reach;with chair alarm set;with family/visitor present     Time: 1040-1103 PT Time Calculation (min) (ACUTE ONLY): 23 min  Charges:  $Therapeutic Activity: 23-37 mins                    G Codes:      Patrick [name] A Corynn Solberg 11/21/2015, 12:05 PM Wray Kearns, [place], DPT [phone]

## 2015-11-21 NOTE — Progress Notes (Signed)
BP remains elevated after morning BP meds.  Manual BP 180/120.  PRN hydralazine 5mg  given.  Will continue to monitor. Cori Razor, RN

## 2015-11-21 NOTE — Progress Notes (Signed)
ANTICOAGULATION CONSULT NOTE - Follow Up Consult  Pharmacy Consult:  Coumadin Indication: atrial fibrillation  No Known Allergies  Patient Measurements: Height: 5\' 9"  (175.3 cm) Weight: 176 lb (79.833 kg) IBW/kg (Calculated) : 70.7  Vital Signs: Temp: 98.5 F (36.9 C) (04/24 0604) Temp Source: Oral (04/24 0604) BP: 165/110 mmHg (04/24 0604) Pulse Rate: 84 (04/24 0604)  Labs:  Recent Labs  11/18/15 1359 11/20/15 0424 11/20/15 1126 11/20/15 1955 11/21/15 0210  HGB 12.9*  --   --   --   --   HCT 40.6  --   --   --   --   PLT 241  --   --   --   --   APTT 35  --   --   --   --   LABPROT 21.6* 32.1*  --   --  32.3*  INR 1.97* 3.20*  --   --  3.22*  CREATININE 2.97* 2.49*  --   --  2.33*  TROPONINI  --  0.09* 0.10* 0.09*  --     Estimated Creatinine Clearance: 30.8 mL/min (by C-G formula based on Cr of 2.33).    Assessment: 52 YOM presented [facility] with HTN emergency.  Pharmacy consulted to continue Coumadin for history of Afib.  INR is supra-therapeutic.  Patient received Coumadin 7.5mg  x 2 doses on 11/19/15, which likely caused the elevated INR.  Coumadin dose was reduced yesterday and INR is relatively unchanged.  No bleeding reported.   Goal of Therapy:  INR 2-3    Plan:  - Coumadin 1mg  PO today - Daily PT / INR   Ronnesha Mester D. Mina Marble, PharmD, BCPS Pager:  [phone] 11/21/2015, 10:49 AM

## 2015-11-22 ENCOUNTER — Inpatient Hospital Stay (HOSPITAL_COMMUNITY)
Admission: RE | Admit: 2015-11-22 | Discharge: 2015-12-09 | DRG: 057 | Disposition: A | Discharge status: Skilled Nursing Facility | Payer: Medicare Other | Source: Intra-hospital | Attending: Physical Medicine & Rehabilitation | Admitting: Physical Medicine & Rehabilitation

## 2015-11-22 DIAGNOSIS — I6789 Other cerebrovascular disease: Secondary | ICD-10-CM | POA: Diagnosis not present

## 2015-11-22 DIAGNOSIS — I5032 Chronic diastolic (congestive) heart failure: Secondary | ICD-10-CM

## 2015-11-22 DIAGNOSIS — I2510 Atherosclerotic heart disease of native coronary artery without angina pectoris: Secondary | ICD-10-CM

## 2015-11-22 DIAGNOSIS — I482 Chronic atrial fibrillation: Secondary | ICD-10-CM

## 2015-11-22 DIAGNOSIS — E785 Hyperlipidemia, unspecified: Secondary | ICD-10-CM | POA: Diagnosis not present

## 2015-11-22 DIAGNOSIS — N183 Chronic kidney disease, stage 3 unspecified: Secondary | ICD-10-CM

## 2015-11-22 DIAGNOSIS — Z79899 Other long term (current) drug therapy: Secondary | ICD-10-CM

## 2015-11-22 DIAGNOSIS — I639 Cerebral infarction, unspecified: Secondary | ICD-10-CM

## 2015-11-22 DIAGNOSIS — I130 Hypertensive heart and chronic kidney disease with heart failure and stage 1 through stage 4 chronic kidney disease, or unspecified chronic kidney disease: Secondary | ICD-10-CM | POA: Diagnosis not present

## 2015-11-22 DIAGNOSIS — I10 Essential (primary) hypertension: Secondary | ICD-10-CM | POA: Diagnosis present

## 2015-11-22 DIAGNOSIS — E119 Type 2 diabetes mellitus without complications: Secondary | ICD-10-CM | POA: Insufficient documentation

## 2015-11-22 DIAGNOSIS — Z7982 Long term (current) use of aspirin: Secondary | ICD-10-CM

## 2015-11-22 DIAGNOSIS — I6340 Cerebral infarction due to embolism of unspecified cerebral artery: Secondary | ICD-10-CM

## 2015-11-22 DIAGNOSIS — E871 Hypo-osmolality and hyponatremia: Secondary | ICD-10-CM | POA: Diagnosis not present

## 2015-11-22 DIAGNOSIS — Z87891 Personal history of nicotine dependence: Secondary | ICD-10-CM | POA: Diagnosis not present

## 2015-11-22 DIAGNOSIS — Z7984 Long term (current) use of oral hypoglycemic drugs: Secondary | ICD-10-CM

## 2015-11-22 DIAGNOSIS — M109 Gout, unspecified: Secondary | ICD-10-CM | POA: Diagnosis not present

## 2015-11-22 DIAGNOSIS — N182 Chronic kidney disease, stage 2 (mild): Secondary | ICD-10-CM | POA: Diagnosis not present

## 2015-11-22 DIAGNOSIS — R262 Difficulty in walking, not elsewhere classified: Secondary | ICD-10-CM | POA: Diagnosis not present

## 2015-11-22 DIAGNOSIS — G4730 Sleep apnea, unspecified: Secondary | ICD-10-CM

## 2015-11-22 DIAGNOSIS — I872 Venous insufficiency (chronic) (peripheral): Secondary | ICD-10-CM

## 2015-11-22 DIAGNOSIS — K259 Gastric ulcer, unspecified as acute or chronic, without hemorrhage or perforation: Secondary | ICD-10-CM | POA: Diagnosis not present

## 2015-11-22 DIAGNOSIS — N184 Chronic kidney disease, stage 4 (severe): Secondary | ICD-10-CM | POA: Diagnosis present

## 2015-11-22 DIAGNOSIS — E46 Unspecified protein-calorie malnutrition: Secondary | ICD-10-CM

## 2015-11-22 DIAGNOSIS — R3129 Other microscopic hematuria: Secondary | ICD-10-CM | POA: Diagnosis not present

## 2015-11-22 DIAGNOSIS — I63413 Cerebral infarction due to embolism of bilateral middle cerebral arteries: Secondary | ICD-10-CM | POA: Insufficient documentation

## 2015-11-22 DIAGNOSIS — I5020 Unspecified systolic (congestive) heart failure: Secondary | ICD-10-CM | POA: Diagnosis present

## 2015-11-22 DIAGNOSIS — F0150 Vascular dementia without behavioral disturbance: Secondary | ICD-10-CM | POA: Diagnosis not present

## 2015-11-22 DIAGNOSIS — M6281 Muscle weakness (generalized): Secondary | ICD-10-CM | POA: Diagnosis not present

## 2015-11-22 DIAGNOSIS — I69322 Dysarthria following cerebral infarction: Secondary | ICD-10-CM

## 2015-11-22 DIAGNOSIS — Z0000 Encounter for general adult medical examination without abnormal findings: Secondary | ICD-10-CM

## 2015-11-22 DIAGNOSIS — I69393 Ataxia following cerebral infarction: Secondary | ICD-10-CM

## 2015-11-22 DIAGNOSIS — R278 Other lack of coordination: Secondary | ICD-10-CM | POA: Diagnosis not present

## 2015-11-22 DIAGNOSIS — I69319 Unspecified symptoms and signs involving cognitive functions following cerebral infarction: Secondary | ICD-10-CM

## 2015-11-22 DIAGNOSIS — I252 Old myocardial infarction: Secondary | ICD-10-CM | POA: Diagnosis not present

## 2015-11-22 DIAGNOSIS — I517 Cardiomegaly: Secondary | ICD-10-CM | POA: Diagnosis not present

## 2015-11-22 DIAGNOSIS — I4821 Permanent atrial fibrillation: Secondary | ICD-10-CM

## 2015-11-22 DIAGNOSIS — E1122 Type 2 diabetes mellitus with diabetic chronic kidney disease: Secondary | ICD-10-CM

## 2015-11-22 DIAGNOSIS — I674 Hypertensive encephalopathy: Secondary | ICD-10-CM | POA: Diagnosis not present

## 2015-11-22 DIAGNOSIS — G451 Carotid artery syndrome (hemispheric): Secondary | ICD-10-CM

## 2015-11-22 DIAGNOSIS — I69351 Hemiplegia and hemiparesis following cerebral infarction affecting right dominant side: Principal | ICD-10-CM

## 2015-11-22 DIAGNOSIS — I480 Paroxysmal atrial fibrillation: Secondary | ICD-10-CM | POA: Diagnosis not present

## 2015-11-22 DIAGNOSIS — I63139 Cerebral infarction due to embolism of unspecified carotid artery: Secondary | ICD-10-CM | POA: Diagnosis not present

## 2015-11-22 DIAGNOSIS — N185 Chronic kidney disease, stage 5: Secondary | ICD-10-CM | POA: Diagnosis present

## 2015-11-22 DIAGNOSIS — Z7901 Long term (current) use of anticoagulants: Secondary | ICD-10-CM

## 2015-11-22 DIAGNOSIS — G9349 Other encephalopathy: Secondary | ICD-10-CM | POA: Diagnosis not present

## 2015-11-22 DIAGNOSIS — G459 Transient cerebral ischemic attack, unspecified: Secondary | ICD-10-CM | POA: Insufficient documentation

## 2015-11-22 DIAGNOSIS — R41841 Cognitive communication deficit: Secondary | ICD-10-CM | POA: Diagnosis not present

## 2015-11-22 LAB — GLUCOSE, CAPILLARY
GLUCOSE-CAPILLARY: 177 mg/dL — AB (ref 65–99)
GLUCOSE-CAPILLARY: 195 mg/dL — AB (ref 65–99)
GLUCOSE-CAPILLARY: 258 mg/dL — AB (ref 65–99)
Glucose-Capillary: 189 mg/dL — ABNORMAL HIGH (ref 65–99)

## 2015-11-22 LAB — BASIC METABOLIC PANEL
Anion gap: 14 (ref 5–15)
BUN: 28 mg/dL — AB (ref 6–20)
CO2: 22 mmol/L (ref 22–32)
CREATININE: 2.37 mg/dL — AB (ref 0.61–1.24)
Calcium: 9.3 mg/dL (ref 8.9–10.3)
Chloride: 98 mmol/L — ABNORMAL LOW (ref 101–111)
GFR, EST AFRICAN AMERICAN: 31 mL/min — AB (ref >60)
GFR, EST NON AFRICAN AMERICAN: 27 mL/min — AB (ref >60)
Glucose, Bld: 179 mg/dL — ABNORMAL HIGH (ref 65–99)
POTASSIUM: 3.5 mmol/L (ref 3.5–5.1)
SODIUM: 134 mmol/L — AB (ref 135–145)

## 2015-11-22 LAB — CBC
HCT: 42.1 % (ref 39.0–52.0)
HEMOGLOBIN: 13.9 g/dL (ref 13.0–17.0)
MCH: 25.5 pg — AB (ref 26.0–34.0)
MCHC: 33 g/dL (ref 30.0–36.0)
MCV: 77.2 fL — ABNORMAL LOW (ref 78.0–100.0)
PLATELETS: 196 10*3/uL (ref 150–400)
RBC: 5.45 MIL/uL (ref 4.22–5.81)
RDW: 14.5 % (ref 11.5–15.5)
WBC: 6.1 10*3/uL (ref 4.0–10.5)

## 2015-11-22 LAB — PHOSPHORUS: PHOSPHORUS: 3.5 mg/dL (ref 2.5–4.6)

## 2015-11-22 LAB — MAGNESIUM: MAGNESIUM: 1.7 mg/dL (ref 1.7–2.4)

## 2015-11-22 LAB — PROTIME-INR
INR: 2.94 — AB (ref 0.00–1.49)
Prothrombin Time: 30.1 seconds — ABNORMAL HIGH (ref 11.6–15.2)

## 2015-11-22 MED ORDER — DICLOFENAC SODIUM 1 % TD GEL
4.0000 g | Freq: Four times a day (QID) | TRANSDERMAL | Status: DC
  Administered 2015-11-22 – 2015-12-09 (×22): 4 g via TOPICAL
  Filled 2015-11-22: qty 100

## 2015-11-22 MED ORDER — GUAIFENESIN-DM 100-10 MG/5ML PO SYRP: 5.0000 mL | ORAL_SOLUTION | Freq: Four times a day (QID) | ORAL | Status: DC | PRN

## 2015-11-22 MED ORDER — ALUM & MAG HYDROXIDE-SIMETH 200-200-20 MG/5ML PO SUSP: 30.0000 mL | ORAL | Status: DC | PRN

## 2015-11-22 MED ORDER — POLYETHYLENE GLYCOL 3350 17 G PO PACK: 17.0000 g | PACK | Freq: Every day | ORAL | Status: DC

## 2015-11-22 MED ORDER — POLYETHYLENE GLYCOL 3350 17 G PO PACK
17.0000 g | PACK | Freq: Every day | ORAL | Status: DC
  Administered 2015-11-23 – 2015-12-08 (×14): 17 g via ORAL
  Filled 2015-11-22 (×15): qty 1

## 2015-11-22 MED ORDER — HYDRALAZINE HCL 20 MG/ML IJ SOLN
10.0000 mg | Freq: Four times a day (QID) | INTRAMUSCULAR | Status: DC | PRN
  Filled 2015-11-22: qty 1

## 2015-11-22 MED ORDER — DICLOFENAC SODIUM 1 % TD GEL: 4.0000 g | Freq: Four times a day (QID) | TRANSDERMAL | Status: DC

## 2015-11-22 MED ORDER — INSULIN ASPART 100 UNIT/ML ~~LOC~~ SOLN
0.0000 [IU] | Freq: Every day | SUBCUTANEOUS | Status: DC
  Administered 2015-11-22: 3 [IU] via SUBCUTANEOUS
  Administered 2015-11-23 – 2015-12-01 (×4): 2 [IU] via SUBCUTANEOUS

## 2015-11-22 MED ORDER — SENNOSIDES-DOCUSATE SODIUM 8.6-50 MG PO TABS: 1.0000 | ORAL_TABLET | Freq: Two times a day (BID) | ORAL | Status: DC

## 2015-11-22 MED ORDER — LISINOPRIL 20 MG PO TABS: 20.0000 mg | ORAL_TABLET | Freq: Every day | ORAL | Status: DC

## 2015-11-22 MED ORDER — INSULIN ASPART 100 UNIT/ML ~~LOC~~ SOLN
0.0000 [IU] | Freq: Three times a day (TID) | SUBCUTANEOUS | Status: DC
  Administered 2015-11-22: 4 [IU] via SUBCUTANEOUS
  Administered 2015-11-23: 7 [IU] via SUBCUTANEOUS
  Administered 2015-11-23 (×2): 3 [IU] via SUBCUTANEOUS
  Administered 2015-11-24: 4 [IU] via SUBCUTANEOUS
  Administered 2015-11-24: 7 [IU] via SUBCUTANEOUS
  Administered 2015-11-24: 4 [IU] via SUBCUTANEOUS
  Administered 2015-11-25: 7 [IU] via SUBCUTANEOUS
  Administered 2015-11-25 (×2): 4 [IU] via SUBCUTANEOUS
  Administered 2015-11-26: 3 [IU] via SUBCUTANEOUS
  Administered 2015-11-26: 7 [IU] via SUBCUTANEOUS
  Administered 2015-11-26: 4 [IU] via SUBCUTANEOUS
  Administered 2015-11-27: 11 [IU] via SUBCUTANEOUS
  Administered 2015-11-27 (×2): 4 [IU] via SUBCUTANEOUS
  Administered 2015-11-28: 3 [IU] via SUBCUTANEOUS
  Administered 2015-11-28 – 2015-11-30 (×4): 4 [IU] via SUBCUTANEOUS
  Administered 2015-11-30: 7 [IU] via SUBCUTANEOUS
  Administered 2015-12-01 (×2): 4 [IU] via SUBCUTANEOUS
  Administered 2015-12-02: 3 [IU] via SUBCUTANEOUS
  Administered 2015-12-02: 4 [IU] via SUBCUTANEOUS
  Administered 2015-12-03 – 2015-12-04 (×5): 3 [IU] via SUBCUTANEOUS
  Administered 2015-12-05: 11 [IU] via SUBCUTANEOUS
  Administered 2015-12-06: 3 [IU] via SUBCUTANEOUS
  Administered 2015-12-06: 4 [IU] via SUBCUTANEOUS
  Administered 2015-12-07: 3 [IU] via SUBCUTANEOUS
  Administered 2015-12-07: 11 [IU] via SUBCUTANEOUS
  Administered 2015-12-08: 7 [IU] via SUBCUTANEOUS
  Administered 2015-12-09: 4 [IU] via SUBCUTANEOUS

## 2015-11-22 MED ORDER — HYDRALAZINE HCL 10 MG PO TABS: 10.0000 mg | ORAL_TABLET | Freq: Four times a day (QID) | ORAL | Status: DC | PRN

## 2015-11-22 MED ORDER — DIPHENHYDRAMINE HCL 12.5 MG/5ML PO ELIX: 12.5000 mg | ORAL_SOLUTION | Freq: Four times a day (QID) | ORAL | Status: DC | PRN

## 2015-11-22 MED ORDER — ASPIRIN EC 81 MG PO TBEC
81.0000 mg | DELAYED_RELEASE_TABLET | Freq: Every day | ORAL | Status: DC
  Administered 2015-11-23 – 2015-12-09 (×17): 81 mg via ORAL
  Filled 2015-11-22 (×17): qty 1

## 2015-11-22 MED ORDER — ACETAMINOPHEN 325 MG PO TABS: 650.0000 mg | ORAL_TABLET | Freq: Four times a day (QID) | ORAL | Status: DC | PRN

## 2015-11-22 MED ORDER — PROCHLORPERAZINE EDISYLATE 5 MG/ML IJ SOLN: 5.0000 mg | Freq: Four times a day (QID) | INTRAMUSCULAR | Status: DC | PRN

## 2015-11-22 MED ORDER — WARFARIN SODIUM 3 MG PO TABS
3.0000 mg | ORAL_TABLET | Freq: Once | ORAL | Status: AC
  Administered 2015-11-22: 3 mg via ORAL
  Filled 2015-11-22 (×2): qty 1

## 2015-11-22 MED ORDER — FLEET ENEMA 7-19 GM/118ML RE ENEM
1.0000 | ENEMA | Freq: Once | RECTAL | Status: AC | PRN
  Administered 2015-11-29: 1 via RECTAL
  Filled 2015-11-22: qty 1

## 2015-11-22 MED ORDER — WARFARIN - PHARMACIST DOSING INPATIENT
Freq: Every day | Status: DC
  Administered 2015-11-26 – 2015-12-03 (×4)

## 2015-11-22 MED ORDER — DILTIAZEM HCL ER COATED BEADS 120 MG PO CP24
120.0000 mg | ORAL_CAPSULE | Freq: Every day | ORAL | Status: DC
  Administered 2015-11-23: 120 mg via ORAL
  Filled 2015-11-22 (×2): qty 1

## 2015-11-22 MED ORDER — PROCHLORPERAZINE MALEATE 5 MG PO TABS: 5.0000 mg | ORAL_TABLET | Freq: Four times a day (QID) | ORAL | Status: DC | PRN

## 2015-11-22 MED ORDER — SENNOSIDES-DOCUSATE SODIUM 8.6-50 MG PO TABS
1.0000 | ORAL_TABLET | Freq: Every evening | ORAL | Status: DC | PRN
  Administered 2015-11-23 – 2015-11-28 (×4): 1 via ORAL
  Filled 2015-11-22 (×4): qty 1

## 2015-11-22 MED ORDER — CLONIDINE HCL 0.1 MG PO TABS
0.1000 mg | ORAL_TABLET | ORAL | Status: DC | PRN
  Administered 2015-11-22 – 2015-11-23 (×2): 0.1 mg via ORAL
  Filled 2015-11-22 (×2): qty 1

## 2015-11-22 MED ORDER — HYDROCHLOROTHIAZIDE 12.5 MG PO TABS: 12.5000 mg | ORAL_TABLET | Freq: Every day | ORAL | Status: DC

## 2015-11-22 MED ORDER — ACETAMINOPHEN 325 MG PO TABS
325.0000 mg | ORAL_TABLET | ORAL | Status: DC | PRN
  Administered 2015-11-22 – 2015-11-24 (×6): 650 mg via ORAL
  Filled 2015-11-22 (×6): qty 2

## 2015-11-22 MED ORDER — CLONIDINE HCL 0.3 MG PO TABS: 0.3000 mg | ORAL_TABLET | Freq: Three times a day (TID) | ORAL | Status: DC

## 2015-11-22 MED ORDER — WARFARIN SODIUM 3 MG PO TABS
3.0000 mg | ORAL_TABLET | Freq: Once | ORAL | Status: DC
  Filled 2015-11-22: qty 1

## 2015-11-22 MED ORDER — PROCHLORPERAZINE 25 MG RE SUPP: 12.5000 mg | Freq: Four times a day (QID) | RECTAL | Status: DC | PRN

## 2015-11-22 MED ORDER — BISACODYL 5 MG PO TBEC
5.0000 mg | DELAYED_RELEASE_TABLET | Freq: Every day | ORAL | Status: DC | PRN
  Administered 2015-11-27 – 2015-11-28 (×2): 5 mg via ORAL
  Filled 2015-11-22 (×3): qty 1

## 2015-11-22 MED ORDER — TRAZODONE HCL 50 MG PO TABS
25.0000 mg | ORAL_TABLET | Freq: Every evening | ORAL | Status: DC | PRN
  Administered 2015-11-23: 25 mg via ORAL
  Filled 2015-11-22: qty 1

## 2015-11-22 MED ORDER — ALLOPURINOL 100 MG PO TABS
200.0000 mg | ORAL_TABLET | Freq: Every day | ORAL | Status: DC
  Administered 2015-11-23 – 2015-11-25 (×3): 200 mg via ORAL
  Filled 2015-11-22 (×3): qty 2

## 2015-11-22 MED ORDER — SERTRALINE HCL 50 MG PO TABS: 50.0000 mg | ORAL_TABLET | Freq: Every day | ORAL | Status: DC

## 2015-11-22 NOTE — Progress Notes (Addendum)
Occupational Therapy Treatment Patient Details Name: Patrick [name] MRN: [id number] DOB: 1946-04-28 Today's Date: 11/22/2015    History of present illness 70 y.o. male with past medical hx HTN, CKD 3-4, a-fib on Coumadin, CVA, DM2 presenting for Headache for 1 wk and vomiting for a several days. He has also noted weakness in both legs. Upon arrival to the ED SBP was > 270 . He states he has been taking his medications appropriately but was not able to take them this AM due to vomiting.  PMH positive for Hypertension; CKD, stage II; CAD; Depression; Gout; Hyperlipidemia; CVA; GERD; Diabetes mellitus type II; Upper GI bleeding; Paroxysmal atrial fibrillation; Atrial fibrillation; Myocardial infarction; Shortness of breath; Sleep apnea; Stomach ulcer; Stroke; Permanent atrial fibrillation; Chronic anticoagulation, on coumadin; and Tachy-brady syndrome   OT comments  Pt able to stand in session today and participated in ADLs. Pt's HR up to 150 in session. Continue to recommend CIR for rehab. Will continue to follow acutely to increase independence.   Follow Up Recommendations  CIR;Supervision/Assistance - 24 hour    Equipment Recommendations  Other (comment) (TBD)    Recommendations for Other Services Rehab consult    Precautions / Restrictions Precautions Precautions: Fall Restrictions Weight Bearing Restrictions: No       Mobility Bed Mobility Overal bed mobility: Needs Assistance;+2 for physical assistance Bed Mobility: Supine to Sit;Sit to Supine     Supine to sit: Mod assist;+2 for physical assistance;HOB elevated Sit to supine: Mod assist;+2 for physical assistance   General bed mobility comments: Assist with trunk and to scoot bottom to EOB. Pt able to initiate movement of BLEs with increased time and cues. Assist to bring BLEs into bed.   Transfers Overall transfer level: Needs assistance Equipment used: Rolling walker (2 wheeled) (and chair) Transfers: Sit to/from Stand Sit  to Stand: +2 physical assistance;Mod assist         General transfer comment: Assist of 2 to stand from EOB with cues for hand placement/technique with therapist blocking Right knee. Increased knee flexion bilaterally. Manual cues for upright posture and hip extension. Stood x4 from EOB. HR up to 150 bpm.  Max assist for standing balance.    Balance Overall balance assessment: Needs assistance Sitting-balance support: Feet supported;No upper extremity supported Sitting balance-Leahy Scale: Poor Sitting balance - Comments: External support sitting EOB esp during dynamic activities -UE dressing, washing off body etc with lean to the right. Provided postural cues for upright trunk. Sat EOB ~15 minutes.   Standing balance support: During functional activity Standing balance-Leahy Scale: Poor Standing balance comment: Max A of 2 for static standing with BUEs supported on chair. Stood for ~1 minute, 30 sec, 20 sec. Cues provided for upright posture. Therapist blocking right knee. Fatigued quickly.                   ADL Overall ADL's : Needs assistance/impaired         Upper Body Bathing: Moderate assistance;Sitting (sitting EOB)   Lower Body Bathing: Maximal assistance;+2 for safety/equipment (sitting and standing; +2 Mod assist for sit to stand transfer from bed with Max assist for balance) Lower Body Bathing Details (indicate cue type and reason): washed bottom area Upper Body Dressing : Maximal assistance;Sitting (sitting EOB)       Toilet Transfer: +2 for physical assistance;RW;Maximal assistance (+2 Mod assist for sit to stand and Max A for balance)   Toileting- Clothing Manipulation and Hygiene: Maximal assistance;+2 for physical assistance (sitting and standing) Toileting -  Clothing Manipulation Details (indicate cue type and reason): assist positioning urinal while standing; able to do so sitting     Functional mobility during ADLs: +2 for physical assistance;Rolling  walker;Maximal assistance (sit to stand from bed Mod assist with Max A for balance) General ADL Comments: pt sat EOB and performed ADLs.       Vision                     Perception     Praxis      Cognition  Awake/Alert Behavior During Therapy: Flat affect Overall Cognitive Status:  (unsure of baseline) Area of Impairment: Problem solving;Orientation Orientation Level: Disoriented to;Time          Problem Solving: Slow processing      Extremity/Trunk Assessment               Exercises     Shoulder Instructions       General Comments      Pertinent Vitals/ Pain       Pain Assessment: 0-10 Pain Score: 2  Pain Location: head Pain Descriptors / Indicators: Headache Pain Intervention(s): Monitored during session  Home Living                                          Prior Functioning/Environment              Frequency Min 2X/week     Progress Toward Goals  OT Goals(current goals can now be found in the care plan section)  Progress towards OT goals: Progressing toward goals  Acute Rehab OT Goals Patient Stated Goal: not stated OT Goal Formulation: With patient Time For Goal Achievement: 12/04/15 Potential to Achieve Goals: Good ADL Goals Pt Will Perform Grooming: with min guard assist;standing Pt Will Perform Upper Body Bathing: with set-up;with supervision;sitting Pt Will Perform Lower Body Bathing: with min assist;sit to/from stand Pt Will Transfer to Toilet: with min assist;ambulating;bedside commode (BSC over toilet) Pt Will Perform Toileting - Clothing Manipulation and hygiene: with min assist;sit to/from stand  Plan Discharge plan remains appropriate    Co-evaluation    PT/OT/SLP Co-Evaluation/Treatment: Yes Reason for Co-Treatment: For patient/therapist safety   OT goals addressed during session: ADL's and self-care;Other (comment) (mobility)      End of Session Equipment Utilized During Treatment: Gait  belt;Rolling walker   Activity Tolerance Patient tolerated treatment well;Patient limited by fatigue   Patient Left in bed;with bed alarm set;with call bell/phone within reach;with SCD's reapplied   Nurse Communication          Time: [id number] OT Time Calculation (min): 30 min  Charges: OT General Charges $OT Visit: 1 Procedure OT Treatments $Self Care/Home Management : 8-22 mins  Benito Mccreedy OTR/L [id number] 11/22/2015, 4:17 PM

## 2015-11-22 NOTE — Progress Notes (Signed)
Retta Diones, RN Rehab Admission Coordinator Signed Physical Medicine and Rehabilitation PMR Pre-admission 11/22/2015 2:16 PM  Related encounter: ED to Hosp-Admission (Discharged) from 11/18/2015 in [place] Collapse All   PMR Admission Coordinator Pre-Admission Assessment  Patient: Patrick [name] is an 70 y.o., male MRN: [id number] DOB: 25-Aug-1945 Height: 5\' 9"  (175.3 cm) Weight: 79.379 kg (175 lb)  Insurance Information HMO: No PPO: PCP: IPA: 80/20: OTHER:  PRIMARY: Medicare A/B Policy#: [id number] Subscriber: Patrick [name] CM Name: Phone#: Fax#:  Pre-Cert#: Employer: Not employed Benefits: Phone #: Name: Checked in [place]. Date: 07/30/05=A and 03/30/06=B Deduct: $1316 Out of Pocket Max: none  Life Max: unlimited CIR: 100% SNF: 100 days Outpatient: 80% Co-Pay: 20% Home Health: 100% Co-Pay: none DME: 80% Co-Pay: 20% Providers: patient's choice  Medicaid Application Date: Case Manager:  Disability Application Date: Case Worker:   Emergency Facilities manager Information    Name Relation Home Work Mobile   Patrick [name] Spouse [phone]       Current Medical History  Patient Admitting Diagnosis: B CVA  History of Present Illness: A 70 y.o. male with history of CKD, HTN, A fib, DMT2, prior CVAs with cognitive deficits who was admitted on 11/18/15 with hypertensive emergency, HA, weakness and minor right sided weakness. He was admitted due to elevated BP and concerns of PRES. Patient had missed multiple BP doses due to wife being in the hospital. Work up showed acute on chronic renal insufficiency as well as INR -1.97. He was started on cardene drip  and home medications resumed. 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 MRI brain negative. Carotid dopplers without significant ICA stenosis. 2 D echo with EF 40-45% and no wall abnormality. Work up negative and neurology has signed off. He had decline in mobility with inability to stand, RLE stability due to worsening of RLE weakness yesterday. Repeat MRI brain done showing interval development of scattered nonhemorrhagic infarcts medial aspect of posterior frontal and parietal lobes bilaterally, remote left thalamic infarcts, prior hemorrhagic infarct right cerebellar and posterior right temporal-occipital junction and global atrophy. Blood pressures remain labile requiring IV hydralazine and lisinopril increased for better control. Therapy ongoing and CIR recommended for follow up therapy. He was cleared medically for intensive rehab program.    Total: 3=NIH  Past Medical History  Past Medical History  Diagnosis Date  . Hypertension   . CKD (chronic kidney disease), stage II   . CAD (coronary artery disease)   . Depression   . Gout   . Hyperlipidemia   . 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 GERD (gastroesophageal reflux disease)   . Diabetes mellitus type II     Insulin dependent  . Upper GI bleeding 07/01/2013  . Paroxysmal atrial fibrillation (HCC)     on coumadin  . Atrial fibrillation ([place])   . Myocardial infarction [facility]) 2000's    "near heart attack" (07/14/2013)  . Shortness of breath     "can happen at any time" (07/14/2013)  . Sleep apnea 07/2010    "has mask at home; seldom uses it" (07/14/2013)  . Stomach ulcer 1950's    "as a teenager"  . Stroke [facility]) ~ 2007; ~ 2009    "memory not as good since" (07/14/2013)  . Permanent atrial fibrillation ([place]) 07/17/2013  . Chronic anticoagulation, on coumadin 07/17/2013  . Tachy-brady  syndrome ([place]) 07/17/2013    Family History  family history includes Cancer in his brother; Diabetes in his mother and sister; Heart Problems in his father; Hypertension in his mother and sister.  Prior Rehab/Hospitalizations: No previous rehab  Has the patient had major surgery during 100 days prior to admission? No  Current Medications   Current facility-administered medications:  . acetaminophen (TYLENOL) tablet 1,000 mg, 1,000 mg, Oral, Q6H PRN, Liberty Handy, MD, 1,000 mg at 11/22/15 1244 . allopurinol (ZYLOPRIM) tablet 200 mg, 200 mg, Oral, Daily, Debbe Odea, MD, 200 mg at 11/22/15 0911 . aspirin EC tablet 81 mg, 81 mg, Oral, Daily, Debbe Odea, MD, 81 mg at 11/22/15 0911 . diclofenac sodium (VOLTAREN) 1 % transdermal gel 4 g, 4 g, Topical, QID, Liberty Handy, MD, 4 g at 11/22/15 0914 . diltiazem (CARDIZEM CD) 24 hr capsule 120 mg, 120 mg, Oral, Daily, Debbe Odea, MD, 120 mg at 11/22/15 0911 . hydrALAZINE (APRESOLINE) injection 10 mg, 10 mg, Intravenous, Q6H PRN, Rushil Patel V, MD . insulin aspart (novoLOG) injection 0-20 Units, 0-20 Units, Subcutaneous, TID WC, Liberty Handy, MD, 4 Units at 11/22/15 1244 . insulin aspart (novoLOG) injection 0-5 Units, 0-5 Units, Subcutaneous, QHS, Liberty Handy, MD, 0 Units at 11/21/15 2200 . polyethylene glycol (MIRALAX / GLYCOLAX) packet 17 g, 17 g, Oral, Daily, Liberty Handy, MD, 17 g at 11/22/15 0911 . senna-docusate (Senokot-S) tablet 1 tablet, 1 tablet, Oral, QHS PRN, Liberty Handy, MD . warfarin (COUMADIN) tablet 3 mg, 3 mg, Oral, ONCE-1800, Tyrone Apple, RPH . Warfarin - Pharmacist Dosing Inpatient, , Does not apply, q1800, Randa Spike, RPH  Patients Current Diet: Diet heart healthy/carb modified Room service appropriate?: Yes; Fluid consistency:: Thin Diet - low sodium heart healthy  Precautions / Restrictions Precautions Precautions: Fall Restrictions Weight Bearing Restrictions: No   Has the patient had 2 or more  falls or a fall with injury in the past year?No. Wife reports that patient has been unsteady most recently.  Prior Activity Level Limited Community (1-2x/wk): Went out 2-3 X a week.  Home Assistive Devices / Equipment Home Assistive Devices/Equipment: None Home Equipment: Bedside commode (wife has BSC)  Prior Device Use: Indicate devices/aids used by the patient prior to current illness, exacerbation or injury? None  Prior Functional Level Prior Function Level of Independence: Independent  Self Care: Did the patient need help bathing, dressing, using the toilet or eating? Independent  Indoor Mobility: Did the patient need assistance with walking from room to room (with or without device)? Independent  Stairs: Did the patient need assistance with internal or external stairs (with or without device)? Independent  Functional Cognition: Did the patient need help planning regular tasks such as shopping or remembering to take medications? Independent  Current Functional Level Cognition  Overall Cognitive Status: Impaired/Different from baseline Current Attention Level: Sustained Orientation Level: Oriented X4 Following Commands: Follows one step commands with increased time   Extremity Assessment (includes Sensation/Coordination)  Upper Extremity Assessment: Overall WFL for tasks assessed  Lower Extremity Assessment: Defer to PT evaluation RLE Deficits / Details: AROM WFL, strength hip flexion 2+/5, knee extension 2/5, ankle DF 3/5 LLE Deficits / Details: AROM WFL, strength hip flexion 2+/5, knee extension 3/5, ankle DF 3/5    ADLs  Overall ADL's : Needs assistance/impaired Eating/Feeding: Set up, Sitting Grooming: Min guard, Sitting, Set up Upper Body Bathing: Minimal assitance, Sitting Lower Body Bathing: Maximal assistance, Sit to/from stand, +2 for physical assistance Upper Body Dressing : Minimal assistance, Sitting Lower Body Dressing: Maximal assistance, +2 for  safety/equipment, Sit  to/from stand Lower Body Dressing Details (indicate cue type and reason): Pt unable to pull up socks in sitting secondary to increased pain in R thigh. Toilet Transfer: Maximal assistance, Stand-pivot, BSC (pt holding on to therapist bil forearms) Toilet Transfer Details (indicate cue type and reason): simulated by transfer from EOB to chair Toileting- Clothing Manipulation and Hygiene: Maximal assistance, +2 for physical assistance, Sit to/from stand Functional mobility during ADLs: Maximal assistance (for stand pivot transfer) General ADL Comments: Pt with significant weakness in R LE; significant difficulty moving RLE in standing for stand pivot.    Mobility  Overal bed mobility: Needs Assistance Bed Mobility: Supine to Sit Supine to sit: Max assist, Mod assist, HOB elevated General bed mobility comments: Assist with RLE and to elevate trunk to get to EOB. Use of bed rail for support. Increased time.    Transfers  Overall transfer level: Needs assistance Equipment used: Rolling walker (2 wheeled) Transfers: Sit to/from Stand Sit to Stand: Mod assist, +2 physical assistance, From elevated surface Stand pivot transfers: Max assist General transfer comment: Assist of 2 to stand from EOB with cues for hand placement, technique with therapist stabilizing RLE. Right knee instability in standing. SPT bed to chair with max A of 2. Difficulty advancing RLE and mobilizing LLE due to right knee buckling.    Ambulation / Gait / Stairs / Wheelchair Mobility  Ambulation/Gait Ambulation/Gait assistance: (Deferred secondary to elevated BP - 180/129 manually.) Ambulation Distance (Feet): 10 Feet Assistive device: Rolling walker (2 wheeled) Gait Pattern/deviations: Step-to pattern, Decreased stride length, Shuffle, Trunk flexed, Decreased dorsiflexion - left, Decreased dorsiflexion - right General Gait Details: initially needed facilitation for weight shift to allow LE  progression, at times needing assist to progress R foot due to dragging on floor, then able to sequence and weight shift with less assist once started walking    Posture / Balance Dynamic Sitting Balance Sitting balance - Comments: Mod A initially with moments of Min guard with LOB to the right. Able to self correct with cues inconsistently.  Balance Overall balance assessment: Needs assistance Sitting-balance support: Feet supported, No upper extremity supported Sitting balance-Leahy Scale: Poor Sitting balance - Comments: Mod A initially with moments of Min guard with LOB to the right. Able to self correct with cues inconsistently.  Standing balance support: Bilateral upper extremity supported Standing balance-Leahy Scale: Poor Standing balance comment: Max A of 2 for standing balance with therapist blocking right knee for support.     Special needs/care consideration BiPAP/CPAP Yes, but he does not use it consistently. CPM No Continuous Drip IV No Dialysis No  Life Vest No Oxygen No Special Bed No Trach Size No Wound Vac (area) No  Skin Left leg bruising  Bowel mgmt: Last BM 11/18/2015 Bladder mgmt: Using urinal WDL Diabetic mgmt Yes, on oral medications    Previous Home Environment Living Arrangements: Spouse/significant other Available Help at Discharge: Family Type of Home: Apartment Home Layout: One level Home Access: Level entry Bathroom Shower/Tub: Chiropodist: Standard Home Care Services: No Additional Comments: patient's wife just here last week due to stroke  Discharge Living Setting Plans for Discharge Living Setting: Patient's home, Lives with (comment), Apartment (Lives with wife.) Type of Home at Discharge: Apartment Discharge Home Layout: One level Discharge Home Access: Level entry Does the patient have any problems obtaining your medications?: Yes (Describe) (financial, not using  catapres patch because of cost)  Social/Family/Support Systems Patient Roles: Spouse, Parent (Has a wife and 4 sons.)  Contact Information: Patrick [name] - spouse Anticipated Caregiver: Wife Anticipated Caregiver's Contact Information: Patrick [name] - wife (h) [phone]  Ability/Limitations of Caregiver: Wife can assist. Has four sons who can also assist. Caregiver Availability: 24/7 Discharge Plan Discussed with Primary Caregiver: Yes Is Caregiver In Agreement with Plan?: Yes Does Caregiver/Family have Issues with Lodging/Transportation while Pt is in Rehab?: No  Goals/Additional Needs Patient/Family Goal for Rehab: PT/OT supervision to min assist, ST supervision goals Expected length of stay: 14-18 days Cultural Considerations: Christian Dietary Needs: Heart healthy, carb mod, thin liquids Equipment Needs: TBD Pt/Family Agrees to Admission and willing to participate: Yes Program Orientation Provided & Reviewed with Pt/Caregiver Including Roles & Responsibilities: Yes  Decrease burden of Care through IP rehab admission: N/A  Possible need for SNF placement upon discharge: Not anticipated  Patient Condition: This patient's condition remains as documented in the consult dated 11/21/15 and addended 11/22/15, in which the Rehabilitation Physician determined and documented that the patient's condition is appropriate for intensive rehabilitative care in an inpatient rehabilitation facility pending stable BP and HR. These areas have been addressed. Neurology wants permissive BP for the next 24 hours with prn intervention. Neurology and attending MD have cleared patient for acute inpatient rehab today. Will admit to inpatient rehab today.  Preadmission Screen Completed By: Retta Diones, 11/22/2015 2:34 PM ______________________________________________________________________  Discussed status with Dr. Naaman Plummer on 04/25/17at 1433 and received telephone approval for admission  today.  Admission Coordinator: Retta Diones, time1433/Date04/25/17          Cosigned by: Meredith Staggers, MD at 11/22/2015 3:06 PM  Revision History     Date/Time User Provider Type Action   11/22/2015 3:06 PM Meredith Staggers, MD Physician Cosign   11/22/2015 2:34 PM Retta Diones, RN Rehab Admission Coordinator Sign

## 2015-11-22 NOTE — Progress Notes (Addendum)
Rehab admissions - Please see rehab MD consult done yesterday.  Patient lacks the medical necessity and lacks rehab diagnosis.  Recommend pursuit of SNF placement for therapies.  Call me for questions.  [phone]  Note:  My rehab MD has asked that we re evaluate patient for potential acute inpatient rehab admission.  I will see patient this am.  Call me for questions.  [phone]

## 2015-11-22 NOTE — Progress Notes (Signed)
Rehab admissions - I have discussed labile blood pressure and need for submissive blood pressure control for the next 24 hours with Dr. Naaman Plummer and Algis Liming, PA.  Has been cleared by attending MD for acute inpatient rehab admission for today.  Bed available and will admit to rehab today.  Call me for questions.  [phone]

## 2015-11-22 NOTE — H&P (Signed)
Physical Medicine and Rehabilitation Admission H&P    Chief Complaint  Patient presents with  . RLE weakness and cognitive deficits.    HPI:   Patrick [name] is a 70 y.o. male with history of CKD, HTN, A fib, DMT2, prior CVAs with cognitive deficits who was admitted on 11/18/15 with hypertensive emergency, HA, weakness and minor right sided weakness. He was admitted due to elevated BP and concerns of PRES. Patient had missed multiple BP doses due to wife being in the hospital. Work up showed acute on chronic renal insufficiency as well as INR -1.97. He was started on cardene drip and home medications resumed. 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 MRI brain negative. Carotid dopplers without significant ICA stenosis. 2 D echo with EF 40-45% and no wall abnormality. Work up negative and neurology has signed off. He had decline in mobility with inability to stand, RLE stability due to worsening of RLE weakness yesterday. Repeat MRI brain done showing interval development of scattered nonhemorrhagic infarcts medial aspect of posterior frontal and parietal lobes bilaterally, remote left thalamic infarcts, prior hemorrhagic infarct right cerebellar and posterior right temporal-occipital junction and global atrophy.  Blood pressures remain labile requiring IV hydralazine and lisinopril increased for better control.  Therapy ongoing and CIR recommended for follow up therapy. He was cleared medically for intensive rehab program.    Review of Systems  HENT: Negative for hearing loss and tinnitus.   Eyes: Negative for blurred vision and double vision.  Respiratory: Negative for cough, shortness of breath and wheezing.   Cardiovascular: Negative for chest pain and palpitations.  Gastrointestinal: Negative for nausea, abdominal pain and constipation.  Genitourinary: Positive for dysuria and frequency (gets up 4 times at night).       Difficulty urinating  Musculoskeletal: Negative for back pain  and joint pain.  Neurological: Positive for focal weakness and headaches (constant  since hospital). Negative for tingling.  Psychiatric/Behavioral: Positive for memory loss.      Past Medical History  Diagnosis Date  . Hypertension   . CKD (chronic kidney disease), stage II   . CAD (coronary artery disease)   . Depression   . Gout   . Hyperlipidemia   . 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 GERD (gastroesophageal reflux disease)   . Diabetes mellitus type II     Insulin dependent  . Upper GI bleeding 07/01/2013  . Paroxysmal atrial fibrillation (HCC)     on coumadin  . Atrial fibrillation ([place])   . Myocardial infarction [facility]) 2000's    "near heart attack" (07/14/2013)  . Shortness of breath     "can happen at any time" (07/14/2013)  . Sleep apnea 07/2010    "has mask at home; seldom uses it" (07/14/2013)  . Stomach ulcer 1950's    "as a teenager"  . Stroke [facility]) ~ 2007; ~ 2009    "memory not as good since" (07/14/2013)  . Permanent atrial fibrillation ([place]) 07/17/2013  . Chronic anticoagulation, on coumadin 07/17/2013  . Tachy-brady syndrome ([place]) 07/17/2013    Past Surgical History  Procedure Laterality Date  . Cardiac catheterization  07/2003    /encounter notes 08/27/2005 (07/01/2013)   Family History  Problem Relation Age of Onset  . Diabetes Mother   . Hypertension Mother   . Heart Problems Father   . Cancer Brother   . Hypertension Sister   . Diabetes Sister     Social  History:  Married. Independent PTA. He reports that he quit smoking about 39 years ago. His smoking use included Cigarettes. He has a 7.5 pack-year smoking history. He has quit using smokeless tobacco. He reports that he does not drink alcohol--.quit 39 years ago. He reports that he does not use illicit drugs.    Allergies: No Known Allergies    Medications Prior to Admission  Medication Sig Dispense Refill  . allopurinol (ZYLOPRIM)  100 MG tablet TAKE TWO TABLETS BY MOUTH ONCE DAILY 60 tablet 0  . aspirin 81 MG EC tablet Take 81 mg by mouth daily.      . cloNIDine (CATAPRES) 0.3 MG tablet Take 1 tablet (0.3 mg total) by mouth 3 (three) times daily. 90 tablet 5  . diltiazem (CARDIZEM) 30 MG tablet Take 30 mg by mouth 3 (three) times daily.    . furosemide (LASIX) 20 MG tablet Take 1 tablet (20 mg total) by mouth 2 (two) times daily. (Patient taking differently: Take 20 mg by mouth daily. ) 60 tablet 5  . lisinopril (PRINIVIL,ZESTRIL) 10 MG tablet Take 1 tablet (10 mg total) by mouth daily. 90 tablet 0  . metFORMIN (GLUCOPHAGE-XR) 500 MG 24 hr tablet TAKE 2 TABLETS BY MOUTH WITH BREAKFAST 60 tablet 0  . pravastatin (PRAVACHOL) 80 MG tablet Take 1 tablet (80 mg total) by mouth daily. 90 tablet 3  . warfarin (COUMADIN) 5 MG tablet Take 1 tablet (5 mg total) by mouth daily at 6 PM. 30 tablet 1  . Blood Glucose Monitoring Suppl (ACCU-CHEK AVIVA PLUS) W/DEVICE KIT 1 each by Does not apply route as needed. 1 kit 0  . diltiazem (DILACOR XR) 120 MG 24 hr capsule Take 1 capsule (120 mg total) by mouth daily. 30 capsule 1  . glucose blood (TRUETRACK TEST) test strip Use to test blood sugar 2-3 times daily dx code 250.00 100 each 11  . Lancets 30G MISC Use to test blood glucose 2-3 times daily dx code 250.00 100 each 11    Home: Home Living Family/patient expects to be discharged to:: Unsure Living Arrangements: Spouse/significant other Available Help at Discharge: Family Type of Home: Apartment Home Access: Level entry Home Layout: One level Bathroom Shower/Tub: Chiropodist: Standard Home Equipment: Bedside commode (wife has BSC) Additional Comments: patient's wife just here last week due to stroke   Functional History: Prior Function Level of Independence: Independent  Functional Status:  Mobility: Bed Mobility Overal bed mobility: Needs Assistance Bed Mobility: Supine to Sit Supine to sit: Max  assist, Mod assist, HOB elevated General bed mobility comments: Assist with RLE and to elevate trunk to get to EOB. Use of bed rail for support. Increased time. Transfers Overall transfer level: Needs assistance Equipment used: Rolling walker (2 wheeled) Transfers: Sit to/from Stand Sit to Stand: Mod assist, +2 physical assistance, From elevated surface Stand pivot transfers: Max assist General transfer comment: Assist of 2 to stand from EOB with cues for hand placement, technique with therapist stabilizing RLE. Right knee instability in standing. SPT bed to chair with max A of 2. Difficulty advancing RLE and mobilizing LLE due to right knee buckling. Ambulation/Gait Ambulation/Gait assistance:  (Deferred secondary to elevated BP - 180/129 manually.) Ambulation Distance (Feet): 10 Feet Assistive device: Rolling walker (2 wheeled) Gait Pattern/deviations: Step-to pattern, Decreased stride length, Shuffle, Trunk flexed, Decreased dorsiflexion - left, Decreased dorsiflexion - right General Gait Details: initially needed facilitation for weight shift to allow LE progression, at times needing assist to progress  R foot due to dragging on floor, then able to sequence and weight shift with less assist once started walking    ADL: ADL Overall ADL's : Needs assistance/impaired Eating/Feeding: Set up, Sitting Grooming: Min guard, Sitting, Set up Upper Body Bathing: Minimal assitance, Sitting Lower Body Bathing: Maximal assistance, Sit to/from stand, +2 for physical assistance Upper Body Dressing : Minimal assistance, Sitting Lower Body Dressing: Maximal assistance, +2 for safety/equipment, Sit to/from stand Lower Body Dressing Details (indicate cue type and reason): Pt unable to pull up socks in sitting secondary to increased pain in R thigh. Toilet Transfer: Maximal assistance, Stand-pivot, BSC (pt holding on to therapist bil forearms) Toilet Transfer Details (indicate cue type and reason):  simulated by transfer from EOB to chair Toileting- Clothing Manipulation and Hygiene: Maximal assistance, +2 for physical assistance, Sit to/from stand Functional mobility during ADLs: Maximal assistance (for stand pivot transfer) General ADL Comments: Pt with significant weakness in R LE; significant difficulty moving RLE in standing for stand pivot.  Cognition: Cognition Overall Cognitive Status: Impaired/Different from baseline Orientation Level: Oriented X4 Cognition Arousal/Alertness: Awake/alert Behavior During Therapy: Flat affect Overall Cognitive Status: Impaired/Different from baseline Area of Impairment: Problem solving Current Attention Level: Sustained Following Commands: Follows one step commands with increased time Problem Solving: Slow processing  Physical Exam: Blood pressure 175/96, pulse 85, temperature 98.7 F (37.1 C), temperature source Oral, resp. rate 20, height '5\' 9"'  (1.753 m), weight 79.379 kg (175 lb), SpO2 98 %. Physical Exam  Nursing note and vitals reviewed. Constitutional: He is oriented to person, place, and time. He appears well-nourished. No distress.  HENT:  Head: Normocephalic and atraumatic.  Eyes: Conjunctivae are normal. Pupils are equal, round, and reactive to light.  Neck: Normal range of motion. Neck supple. No tracheal deviation present. No thyromegaly present.  Cardiovascular: Normal rate.  An irregularly irregular rhythm present.  Respiratory: Effort normal and breath sounds normal. No respiratory distress. He has no wheezes.  GI: Soft. Bowel sounds are normal. He exhibits no distension. There is no tenderness.  Musculoskeletal: He exhibits no edema.  Neurological: He is alert and oriented to person, place, and time.  Mild right facial weakness. Soft slow speech. Difficult to keep awake.  Flat affect. Delayed processing with memory deficits-Sensory deficits RLE. Moves all 4 but favors left over right. Difficulty cooperating with exam due  to fatigue.  RLE grossly 4-/5. LLE 4+. RUE 4 to 4+/5 and LUE 5/5  Skin: Skin is warm and dry. No rash noted. No erythema.  Psychiatric: His affect is blunt. His speech is delayed. He is slowed and withdrawn. Cognition and memory are impaired. He expresses inappropriate judgment. He is inattentive.    Results for orders placed or performed during the hospital encounter of 11/18/15 (from the past 48 hour(s))  Troponin I     Status: Abnormal   Collection Time: 11/20/15 11:26 AM  Result Value Ref Range   Troponin I 0.10 (H) <0.031 ng/mL    Comment:        PERSISTENTLY INCREASED TROPONIN VALUES IN THE RANGE OF 0.04-0.49 ng/mL CAN BE SEEN IN:       -UNSTABLE ANGINA       -CONGESTIVE HEART FAILURE       -MYOCARDITIS       -CHEST TRAUMA       -ARRYHTHMIAS       -LATE PRESENTING MYOCARDIAL INFARCTION       -COPD   CLINICAL FOLLOW-UP RECOMMENDED.   Glucose, capillary  Status: Abnormal   Collection Time: 11/20/15 12:06 PM  Result Value Ref Range   Glucose-Capillary 181 (H) 65 - 99 mg/dL  Glucose, capillary     Status: Abnormal   Collection Time: 11/20/15  4:28 PM  Result Value Ref Range   Glucose-Capillary 191 (H) 65 - 99 mg/dL  Troponin I     Status: Abnormal   Collection Time: 11/20/15  7:55 PM  Result Value Ref Range   Troponin I 0.09 (H) <0.031 ng/mL    Comment:        PERSISTENTLY INCREASED TROPONIN VALUES IN THE RANGE OF 0.04-0.49 ng/mL CAN BE SEEN IN:       -UNSTABLE ANGINA       -CONGESTIVE HEART FAILURE       -MYOCARDITIS       -CHEST TRAUMA       -ARRYHTHMIAS       -LATE PRESENTING MYOCARDIAL INFARCTION       -COPD   CLINICAL FOLLOW-UP RECOMMENDED.   Glucose, capillary     Status: Abnormal   Collection Time: 11/20/15  9:21 PM  Result Value Ref Range   Glucose-Capillary 111 (H) 65 - 99 mg/dL   Comment 1 Notify RN    Comment 2 Document in Chart   Glucose, capillary     Status: Abnormal   Collection Time: 11/20/15 10:17 PM  Result Value Ref Range    Glucose-Capillary 167 (H) 65 - 99 mg/dL   Comment 1 Notify RN    Comment 2 Document in Chart   Basic metabolic panel     Status: Abnormal   Collection Time: 11/21/15  2:10 AM  Result Value Ref Range   Sodium 137 135 - 145 mmol/L   Potassium 3.8 3.5 - 5.1 mmol/L   Chloride 101 101 - 111 mmol/L   CO2 24 22 - 32 mmol/L   Glucose, Bld 199 (H) 65 - 99 mg/dL   BUN 27 (H) 6 - 20 mg/dL   Creatinine, Ser 2.33 (H) 0.61 - 1.24 mg/dL   Calcium 9.0 8.9 - 10.3 mg/dL   GFR calc non Af Amer 27 (L) >60 mL/min   GFR calc Af Amer 32 (L) >60 mL/min    Comment: (NOTE) The eGFR has been calculated using the CKD EPI equation. This calculation has not been validated in all clinical situations. eGFR's persistently <60 mL/min signify possible Chronic Kidney Disease.    Anion gap 12 5 - 15  Protime-INR     Status: Abnormal   Collection Time: 11/21/15  2:10 AM  Result Value Ref Range   Prothrombin Time 32.3 (H) 11.6 - 15.2 seconds   INR 3.22 (H) 0.00 - 1.49  Magnesium     Status: None   Collection Time: 11/21/15  2:10 AM  Result Value Ref Range   Magnesium 1.7 1.7 - 2.4 mg/dL  Phosphorus     Status: None   Collection Time: 11/21/15  2:10 AM  Result Value Ref Range   Phosphorus 2.9 2.5 - 4.6 mg/dL  Glucose, capillary     Status: Abnormal   Collection Time: 11/21/15  6:41 AM  Result Value Ref Range   Glucose-Capillary 188 (H) 65 - 99 mg/dL   Comment 1 Notify RN    Comment 2 Document in Chart   Glucose, capillary     Status: Abnormal   Collection Time: 11/21/15 11:24 AM  Result Value Ref Range   Glucose-Capillary 214 (H) 65 - 99 mg/dL   Comment 1 Notify RN  Glucose, capillary     Status: Abnormal   Collection Time: 11/21/15 12:14 PM  Result Value Ref Range   Glucose-Capillary 205 (H) 65 - 99 mg/dL  CK     Status: None   Collection Time: 11/21/15  4:02 PM  Result Value Ref Range   Total CK 67 49 - 397 U/L  Glucose, capillary     Status: Abnormal   Collection Time: 11/21/15  6:07 PM  Result  Value Ref Range   Glucose-Capillary 192 (H) 65 - 99 mg/dL  Glucose, capillary     Status: Abnormal   Collection Time: 11/21/15  9:55 PM  Result Value Ref Range   Glucose-Capillary 143 (H) 65 - 99 mg/dL   Comment 1 Notify RN    Comment 2 Document in Chart   Protime-INR     Status: Abnormal   Collection Time: 11/22/15  5:40 AM  Result Value Ref Range   Prothrombin Time 30.1 (H) 11.6 - 15.2 seconds   INR 2.94 (H) 0.00 - 1.49  Magnesium     Status: None   Collection Time: 11/22/15  5:40 AM  Result Value Ref Range   Magnesium 1.7 1.7 - 2.4 mg/dL  Phosphorus     Status: None   Collection Time: 11/22/15  5:40 AM  Result Value Ref Range   Phosphorus 3.5 2.5 - 4.6 mg/dL  CBC     Status: Abnormal   Collection Time: 11/22/15  5:40 AM  Result Value Ref Range   WBC 6.1 4.0 - 10.5 K/uL   RBC 5.45 4.22 - 5.81 MIL/uL   Hemoglobin 13.9 13.0 - 17.0 g/dL   HCT 42.1 39.0 - 52.0 %   MCV 77.2 (L) 78.0 - 100.0 fL   MCH 25.5 (L) 26.0 - 34.0 pg   MCHC 33.0 30.0 - 36.0 g/dL   RDW 14.5 11.5 - 15.5 %   Platelets 196 150 - 400 K/uL  Basic metabolic panel     Status: Abnormal   Collection Time: 11/22/15  5:40 AM  Result Value Ref Range   Sodium 134 (L) 135 - 145 mmol/L   Potassium 3.5 3.5 - 5.1 mmol/L   Chloride 98 (L) 101 - 111 mmol/L   CO2 22 22 - 32 mmol/L   Glucose, Bld 179 (H) 65 - 99 mg/dL   BUN 28 (H) 6 - 20 mg/dL   Creatinine, Ser 2.37 (H) 0.61 - 1.24 mg/dL   Calcium 9.3 8.9 - 10.3 mg/dL   GFR calc non Af Amer 27 (L) >60 mL/min   GFR calc Af Amer 31 (L) >60 mL/min    Comment: (NOTE) The eGFR has been calculated using the CKD EPI equation. This calculation has not been validated in all clinical situations. eGFR's persistently <60 mL/min signify possible Chronic Kidney Disease.    Anion gap 14 5 - 15  Glucose, capillary     Status: Abnormal   Collection Time: 11/22/15  6:23 AM  Result Value Ref Range   Glucose-Capillary 177 (H) 65 - 99 mg/dL   Comment 1 Notify RN    Comment 2  Document in Chart    Mr Brain Wo Contrast  11/21/2015  CLINICAL DATA:  70 year old hypertensive male with headache and right-sided weakness. Subsequent encounter. EXAM: MRI HEAD WITHOUT CONTRAST TECHNIQUE: Multiplanar, multiecho pulse sequences of the brain and surrounding structures were obtained without intravenous contrast. COMPARISON:  11/18/2015 MR and CT. FINDINGS: Interval development of scattered small nonhemorrhagic infarcts medial aspect of the posterior frontal and parietal lobes  bilaterally. Remote left thalamic infarct. Blood breakdown products right cerebellum and posterior right temporal -occipital junction suggestive of result of prior hemorrhagic insult. Mild chronic microvascular changes. Global atrophy. Prominent extra-axial spaces over the convexities. Although unchanged from the recent examination, slight increase in size when compared to 2012 (now measuring 1.1 cm on the right and 1.5 cm on the left versus remote 0.9 cm on the right and 1.4 cm on the left in 2012). Inward and slight downward displacement of adjacent brain parenchyma. 3.8 mm midline shift to the left. This may represent the presence of bilateral subdural hygromas. No intracranial mass lesion noted on this unenhanced exam. Major intracranial vascular structures are patent. Superimposed atherosclerotic changes of the vertebral arteries suspected. This is more notable involving the right vertebral artery. Partially empty non expanded sella. Cervical spondylotic changes C3-4. Cervical medullary junction unremarkable. IMPRESSION: Interval development of scattered small nonhemorrhagic infarcts medial aspect of the posterior frontal and parietal lobes bilaterally. Remainder of findings similar to the recent exam including: Remote left thalamic infarct. Blood breakdown products right cerebellum and posterior right temporal -occipital junction suggestive of result of prior hemorrhagic insult. Mild chronic microvascular changes. Global  atrophy. Prominent extra-axial spaces over the convexities. Although unchanged from the recent examination, slight increase in size when compared to 2012. Inward and slight downward displacement of adjacent brain parenchyma. 3.8 mm midline shift to the left. This may represent the presence of bilateral subdural hygromas. Major intracranial vascular structures are patent. Superimposed atherosclerotic changes of the vertebral arteries suspected. This is more notable involving the right vertebral artery. Electronically Signed   By: Genia Del M.D.   On: 11/21/2015 18:20       Medical Problem List and Plan: 1.  Mobilty,cognitive and functional deficits secondary to bi-cerebral embolic CVA   -admit to inpatient rehab 2.  DVT Prophylaxis/Anticoagulation: Pharmaceutical: Coumadin   -pt subtherapeutic upon admision 3. Pain Management: tylenol and tramadol prn for headache  -bp control over time 4. Mood: team to provide ego support 5. Neuropsych: This patient is not capable of making decisions on his own behalf--question of mild cognitive deficits at baseline.  6. Skin/Wound Care: routine pressure relief measures. Add supplements between meals.  7. Fluids/Electrolytes/Nutrition:  Monitor I/O. Check lytes in am. 8. HTN: permissive hypertension with prn hydralazine only for SBP >220 or DBP >120.  -hold catapres, hctz, lisinopril for now and gradually reintroduce 9. A Fib: cardizem/warfarin. Monitor HR with exertion 10 Hyponatremia: mild. Encourage appropriate nutrition 11. CKD: Cr 2.37 today. Follow i's and o's, check serial labs 12. H/o gout: allopurinol 13. Gastric ulcers/GIB:  Protonix, monitor any bleeding while on warfarin  -hgb 13.9      Post Admission Physician Evaluation: 1. Functional deficits secondary  to embolic CVA. 2. Patient is admitted to receive collaborative, interdisciplinary care between the physiatrist, rehab nursing staff, and therapy team. 3. Patient's level of medical  complexity and substantial therapy needs in context of that medical necessity cannot be provided at a lesser intensity of care such as a SNF. 4. Patient has experienced substantial functional loss from his/her baseline which was documented above under the "Functional History" and "Functional Status" headings.  Judging by the patient's diagnosis, physical exam, and functional history, the patient has potential for functional progress which will result in measurable gains while on inpatient rehab.  These gains will be of substantial and practical use upon discharge  in facilitating mobility and self-care at the household level. 5. Physiatrist will provide 24 hour management  of medical needs as well as oversight of the therapy plan/treatment and provide guidance as appropriate regarding the interaction of the two. 6. 24 hour rehab nursing will assist with bladder management, bowel management, safety, skin/wound care, disease management, medication administration, pain management and patient education  and help integrate therapy concepts, techniques,education, etc. 7. PT will assess and treat for/with: Lower extremity strength, range of motion, stamina, balance, functional mobility, safety, adaptive techniques and equipment, NMR, visual-spatial awareness, pain mgt, family ed, ego support.   Goals are: supervision. 8. OT will assess and treat for/with: ADL's, functional mobility, safety, upper extremity strength, adaptive techniques and equipment, NMR, visual-spatial awareness, pain mgt, family ed, community reintegration.   Goals are: supervision. Therapy may proceed with showering this patient. 9. SLP will assess and treat for/with: cognition, communication, family ed.  Goals are: supervision to mod I. 10. Case Management and Social Worker will assess and treat for psychological issues and discharge planning. 11. Team conference will be held weekly to assess progress toward goals and to determine barriers to  discharge. 12. Patient will receive at least 3 hours of therapy per day at least 5 days per week. 13. ELOS: 14-18 days       14. Prognosis:  excellent     Meredith Staggers, MD, [facility] 11/22/2015   11/22/2015

## 2015-11-22 NOTE — Progress Notes (Signed)
Patient refused use of CPAP at this time. RT will continue to monitor.

## 2015-11-22 NOTE — Progress Notes (Signed)
Pt arrived to unit with family at bedside at 1650. RN reviewed rehab packet, safety plan, and scheduling with pt and family. Verbal understanding by family and pt. Call bell within reach, family at bedside.

## 2015-11-22 NOTE — H&P (View-Only) (Signed)
Physical Medicine and Rehabilitation Admission H&P    Chief Complaint  Patient presents with  . RLE weakness and cognitive deficits.    HPI:   Patrick [name] is a 70 y.o. male with history of CKD, HTN, A fib, DMT2, prior CVAs with cognitive deficits who was admitted on 11/18/15 with hypertensive emergency, HA, weakness and minor right sided weakness. He was admitted due to elevated BP and concerns of PRES. Patient had missed multiple BP doses due to wife being in the hospital. Work up showed acute on chronic renal insufficiency as well as INR -1.97. He was started on cardene drip and home medications resumed. 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 MRI brain negative. Carotid dopplers without significant ICA stenosis. 2 D echo with EF 40-45% and no wall abnormality. Work up negative and neurology has signed off. He had decline in mobility with inability to stand, RLE stability due to worsening of RLE weakness yesterday. Repeat MRI brain done showing interval development of scattered nonhemorrhagic infarcts medial aspect of posterior frontal and parietal lobes bilaterally, remote left thalamic infarcts, prior hemorrhagic infarct right cerebellar and posterior right temporal-occipital junction and global atrophy.  Blood pressures remain labile requiring IV hydralazine and lisinopril increased for better control.  Therapy ongoing and CIR recommended for follow up therapy. He was cleared medically for intensive rehab program.    Review of Systems  HENT: Negative for hearing loss and tinnitus.   Eyes: Negative for blurred vision and double vision.  Respiratory: Negative for cough, shortness of breath and wheezing.   Cardiovascular: Negative for chest pain and palpitations.  Gastrointestinal: Negative for nausea, abdominal pain and constipation.  Genitourinary: Positive for dysuria and frequency (gets up 4 times at night).       Difficulty urinating  Musculoskeletal: Negative for back pain  and joint pain.  Neurological: Positive for focal weakness and headaches (constant  since hospital). Negative for tingling.  Psychiatric/Behavioral: Positive for memory loss.      Past Medical History  Diagnosis Date  . Hypertension   . CKD (chronic kidney disease), stage II   . CAD (coronary artery disease)   . Depression   . Gout   . Hyperlipidemia   . 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 GERD (gastroesophageal reflux disease)   . Diabetes mellitus type II     Insulin dependent  . Upper GI bleeding 07/01/2013  . Paroxysmal atrial fibrillation (HCC)     on coumadin  . Atrial fibrillation ([place])   . Myocardial infarction [facility]) 2000's    "near heart attack" (07/14/2013)  . Shortness of breath     "can happen at any time" (07/14/2013)  . Sleep apnea 07/2010    "has mask at home; seldom uses it" (07/14/2013)  . Stomach ulcer 1950's    "as a teenager"  . Stroke [facility]) ~ 2007; ~ 2009    "memory not as good since" (07/14/2013)  . Permanent atrial fibrillation ([place]) 07/17/2013  . Chronic anticoagulation, on coumadin 07/17/2013  . Tachy-brady syndrome ([place]) 07/17/2013    Past Surgical History  Procedure Laterality Date  . Cardiac catheterization  07/2003    /encounter notes 08/27/2005 (07/01/2013)   Family History  Problem Relation Age of Onset  . Diabetes Mother   . Hypertension Mother   . Heart Problems Father   . Cancer Brother   . Hypertension Sister   . Diabetes Sister     Social  History:  Married. Independent PTA. He reports that he quit smoking about 39 years ago. His smoking use included Cigarettes. He has a 7.5 pack-year smoking history. He has quit using smokeless tobacco. He reports that he does not drink alcohol--.quit 39 years ago. He reports that he does not use illicit drugs.    Allergies: No Known Allergies    Medications Prior to Admission  Medication Sig Dispense Refill  . allopurinol (ZYLOPRIM)  100 MG tablet TAKE TWO TABLETS BY MOUTH ONCE DAILY 60 tablet 0  . aspirin 81 MG EC tablet Take 81 mg by mouth daily.      . cloNIDine (CATAPRES) 0.3 MG tablet Take 1 tablet (0.3 mg total) by mouth 3 (three) times daily. 90 tablet 5  . diltiazem (CARDIZEM) 30 MG tablet Take 30 mg by mouth 3 (three) times daily.    . furosemide (LASIX) 20 MG tablet Take 1 tablet (20 mg total) by mouth 2 (two) times daily. (Patient taking differently: Take 20 mg by mouth daily. ) 60 tablet 5  . lisinopril (PRINIVIL,ZESTRIL) 10 MG tablet Take 1 tablet (10 mg total) by mouth daily. 90 tablet 0  . metFORMIN (GLUCOPHAGE-XR) 500 MG 24 hr tablet TAKE 2 TABLETS BY MOUTH WITH BREAKFAST 60 tablet 0  . pravastatin (PRAVACHOL) 80 MG tablet Take 1 tablet (80 mg total) by mouth daily. 90 tablet 3  . warfarin (COUMADIN) 5 MG tablet Take 1 tablet (5 mg total) by mouth daily at 6 PM. 30 tablet 1  . Blood Glucose Monitoring Suppl (ACCU-CHEK AVIVA PLUS) W/DEVICE KIT 1 each by Does not apply route as needed. 1 kit 0  . diltiazem (DILACOR XR) 120 MG 24 hr capsule Take 1 capsule (120 mg total) by mouth daily. 30 capsule 1  . glucose blood (TRUETRACK TEST) test strip Use to test blood sugar 2-3 times daily dx code 250.00 100 each 11  . Lancets 30G MISC Use to test blood glucose 2-3 times daily dx code 250.00 100 each 11    Home: Home Living Family/patient expects to be discharged to:: Unsure Living Arrangements: Spouse/significant other Available Help at Discharge: Family Type of Home: Apartment Home Access: Level entry Home Layout: One level Bathroom Shower/Tub: Chiropodist: Standard Home Equipment: Bedside commode (wife has BSC) Additional Comments: patient's wife just here last week due to stroke   Functional History: Prior Function Level of Independence: Independent  Functional Status:  Mobility: Bed Mobility Overal bed mobility: Needs Assistance Bed Mobility: Supine to Sit Supine to sit: Max  assist, Mod assist, HOB elevated General bed mobility comments: Assist with RLE and to elevate trunk to get to EOB. Use of bed rail for support. Increased time. Transfers Overall transfer level: Needs assistance Equipment used: Rolling walker (2 wheeled) Transfers: Sit to/from Stand Sit to Stand: Mod assist, +2 physical assistance, From elevated surface Stand pivot transfers: Max assist General transfer comment: Assist of 2 to stand from EOB with cues for hand placement, technique with therapist stabilizing RLE. Right knee instability in standing. SPT bed to chair with max A of 2. Difficulty advancing RLE and mobilizing LLE due to right knee buckling. Ambulation/Gait Ambulation/Gait assistance:  (Deferred secondary to elevated BP - 180/129 manually.) Ambulation Distance (Feet): 10 Feet Assistive device: Rolling walker (2 wheeled) Gait Pattern/deviations: Step-to pattern, Decreased stride length, Shuffle, Trunk flexed, Decreased dorsiflexion - left, Decreased dorsiflexion - right General Gait Details: initially needed facilitation for weight shift to allow LE progression, at times needing assist to progress  R foot due to dragging on floor, then able to sequence and weight shift with less assist once started walking    ADL: ADL Overall ADL's : Needs assistance/impaired Eating/Feeding: Set up, Sitting Grooming: Min guard, Sitting, Set up Upper Body Bathing: Minimal assitance, Sitting Lower Body Bathing: Maximal assistance, Sit to/from stand, +2 for physical assistance Upper Body Dressing : Minimal assistance, Sitting Lower Body Dressing: Maximal assistance, +2 for safety/equipment, Sit to/from stand Lower Body Dressing Details (indicate cue type and reason): Pt unable to pull up socks in sitting secondary to increased pain in R thigh. Toilet Transfer: Maximal assistance, Stand-pivot, BSC (pt holding on to therapist bil forearms) Toilet Transfer Details (indicate cue type and reason):  simulated by transfer from EOB to chair Toileting- Clothing Manipulation and Hygiene: Maximal assistance, +2 for physical assistance, Sit to/from stand Functional mobility during ADLs: Maximal assistance (for stand pivot transfer) General ADL Comments: Pt with significant weakness in R LE; significant difficulty moving RLE in standing for stand pivot.  Cognition: Cognition Overall Cognitive Status: Impaired/Different from baseline Orientation Level: Oriented X4 Cognition Arousal/Alertness: Awake/alert Behavior During Therapy: Flat affect Overall Cognitive Status: Impaired/Different from baseline Area of Impairment: Problem solving Current Attention Level: Sustained Following Commands: Follows one step commands with increased time Problem Solving: Slow processing  Physical Exam: Blood pressure 175/96, pulse 85, temperature 98.7 F (37.1 C), temperature source Oral, resp. rate 20, height '5\' 9"'  (1.753 m), weight 79.379 kg (175 lb), SpO2 98 %. Physical Exam  Nursing note and vitals reviewed. Constitutional: He is oriented to person, place, and time. He appears well-nourished. No distress.  HENT:  Head: Normocephalic and atraumatic.  Eyes: Conjunctivae are normal. Pupils are equal, round, and reactive to light.  Neck: Normal range of motion. Neck supple. No tracheal deviation present. No thyromegaly present.  Cardiovascular: Normal rate.  An irregularly irregular rhythm present.  Respiratory: Effort normal and breath sounds normal. No respiratory distress. He has no wheezes.  GI: Soft. Bowel sounds are normal. He exhibits no distension. There is no tenderness.  Musculoskeletal: He exhibits no edema.  Neurological: He is alert and oriented to person, place, and time.  Mild right facial weakness. Soft slow speech. Difficult to keep awake.  Flat affect. Delayed processing with memory deficits-Sensory deficits RLE. Moves all 4 but favors left over right. Difficulty cooperating with exam due  to fatigue.  RLE grossly 4-/5. LLE 4+. RUE 4 to 4+/5 and LUE 5/5  Skin: Skin is warm and dry. No rash noted. No erythema.  Psychiatric: His affect is blunt. His speech is delayed. He is slowed and withdrawn. Cognition and memory are impaired. He expresses inappropriate judgment. He is inattentive.    Results for orders placed or performed during the hospital encounter of 11/18/15 (from the past 48 hour(s))  Troponin I     Status: Abnormal   Collection Time: 11/20/15 11:26 AM  Result Value Ref Range   Troponin I 0.10 (H) <0.031 ng/mL    Comment:        PERSISTENTLY INCREASED TROPONIN VALUES IN THE RANGE OF 0.04-0.49 ng/mL CAN BE SEEN IN:       -UNSTABLE ANGINA       -CONGESTIVE HEART FAILURE       -MYOCARDITIS       -CHEST TRAUMA       -ARRYHTHMIAS       -LATE PRESENTING MYOCARDIAL INFARCTION       -COPD   CLINICAL FOLLOW-UP RECOMMENDED.   Glucose, capillary  Status: Abnormal   Collection Time: 11/20/15 12:06 PM  Result Value Ref Range   Glucose-Capillary 181 (H) 65 - 99 mg/dL  Glucose, capillary     Status: Abnormal   Collection Time: 11/20/15  4:28 PM  Result Value Ref Range   Glucose-Capillary 191 (H) 65 - 99 mg/dL  Troponin I     Status: Abnormal   Collection Time: 11/20/15  7:55 PM  Result Value Ref Range   Troponin I 0.09 (H) <0.031 ng/mL    Comment:        PERSISTENTLY INCREASED TROPONIN VALUES IN THE RANGE OF 0.04-0.49 ng/mL CAN BE SEEN IN:       -UNSTABLE ANGINA       -CONGESTIVE HEART FAILURE       -MYOCARDITIS       -CHEST TRAUMA       -ARRYHTHMIAS       -LATE PRESENTING MYOCARDIAL INFARCTION       -COPD   CLINICAL FOLLOW-UP RECOMMENDED.   Glucose, capillary     Status: Abnormal   Collection Time: 11/20/15  9:21 PM  Result Value Ref Range   Glucose-Capillary 111 (H) 65 - 99 mg/dL   Comment 1 Notify RN    Comment 2 Document in Chart   Glucose, capillary     Status: Abnormal   Collection Time: 11/20/15 10:17 PM  Result Value Ref Range    Glucose-Capillary 167 (H) 65 - 99 mg/dL   Comment 1 Notify RN    Comment 2 Document in Chart   Basic metabolic panel     Status: Abnormal   Collection Time: 11/21/15  2:10 AM  Result Value Ref Range   Sodium 137 135 - 145 mmol/L   Potassium 3.8 3.5 - 5.1 mmol/L   Chloride 101 101 - 111 mmol/L   CO2 24 22 - 32 mmol/L   Glucose, Bld 199 (H) 65 - 99 mg/dL   BUN 27 (H) 6 - 20 mg/dL   Creatinine, Ser 2.33 (H) 0.61 - 1.24 mg/dL   Calcium 9.0 8.9 - 10.3 mg/dL   GFR calc non Af Amer 27 (L) >60 mL/min   GFR calc Af Amer 32 (L) >60 mL/min    Comment: (NOTE) The eGFR has been calculated using the CKD EPI equation. This calculation has not been validated in all clinical situations. eGFR's persistently <60 mL/min signify possible Chronic Kidney Disease.    Anion gap 12 5 - 15  Protime-INR     Status: Abnormal   Collection Time: 11/21/15  2:10 AM  Result Value Ref Range   Prothrombin Time 32.3 (H) 11.6 - 15.2 seconds   INR 3.22 (H) 0.00 - 1.49  Magnesium     Status: None   Collection Time: 11/21/15  2:10 AM  Result Value Ref Range   Magnesium 1.7 1.7 - 2.4 mg/dL  Phosphorus     Status: None   Collection Time: 11/21/15  2:10 AM  Result Value Ref Range   Phosphorus 2.9 2.5 - 4.6 mg/dL  Glucose, capillary     Status: Abnormal   Collection Time: 11/21/15  6:41 AM  Result Value Ref Range   Glucose-Capillary 188 (H) 65 - 99 mg/dL   Comment 1 Notify RN    Comment 2 Document in Chart   Glucose, capillary     Status: Abnormal   Collection Time: 11/21/15 11:24 AM  Result Value Ref Range   Glucose-Capillary 214 (H) 65 - 99 mg/dL   Comment 1 Notify RN  Glucose, capillary     Status: Abnormal   Collection Time: 11/21/15 12:14 PM  Result Value Ref Range   Glucose-Capillary 205 (H) 65 - 99 mg/dL  CK     Status: None   Collection Time: 11/21/15  4:02 PM  Result Value Ref Range   Total CK 67 49 - 397 U/L  Glucose, capillary     Status: Abnormal   Collection Time: 11/21/15  6:07 PM  Result  Value Ref Range   Glucose-Capillary 192 (H) 65 - 99 mg/dL  Glucose, capillary     Status: Abnormal   Collection Time: 11/21/15  9:55 PM  Result Value Ref Range   Glucose-Capillary 143 (H) 65 - 99 mg/dL   Comment 1 Notify RN    Comment 2 Document in Chart   Protime-INR     Status: Abnormal   Collection Time: 11/22/15  5:40 AM  Result Value Ref Range   Prothrombin Time 30.1 (H) 11.6 - 15.2 seconds   INR 2.94 (H) 0.00 - 1.49  Magnesium     Status: None   Collection Time: 11/22/15  5:40 AM  Result Value Ref Range   Magnesium 1.7 1.7 - 2.4 mg/dL  Phosphorus     Status: None   Collection Time: 11/22/15  5:40 AM  Result Value Ref Range   Phosphorus 3.5 2.5 - 4.6 mg/dL  CBC     Status: Abnormal   Collection Time: 11/22/15  5:40 AM  Result Value Ref Range   WBC 6.1 4.0 - 10.5 K/uL   RBC 5.45 4.22 - 5.81 MIL/uL   Hemoglobin 13.9 13.0 - 17.0 g/dL   HCT 42.1 39.0 - 52.0 %   MCV 77.2 (L) 78.0 - 100.0 fL   MCH 25.5 (L) 26.0 - 34.0 pg   MCHC 33.0 30.0 - 36.0 g/dL   RDW 14.5 11.5 - 15.5 %   Platelets 196 150 - 400 K/uL  Basic metabolic panel     Status: Abnormal   Collection Time: 11/22/15  5:40 AM  Result Value Ref Range   Sodium 134 (L) 135 - 145 mmol/L   Potassium 3.5 3.5 - 5.1 mmol/L   Chloride 98 (L) 101 - 111 mmol/L   CO2 22 22 - 32 mmol/L   Glucose, Bld 179 (H) 65 - 99 mg/dL   BUN 28 (H) 6 - 20 mg/dL   Creatinine, Ser 2.37 (H) 0.61 - 1.24 mg/dL   Calcium 9.3 8.9 - 10.3 mg/dL   GFR calc non Af Amer 27 (L) >60 mL/min   GFR calc Af Amer 31 (L) >60 mL/min    Comment: (NOTE) The eGFR has been calculated using the CKD EPI equation. This calculation has not been validated in all clinical situations. eGFR's persistently <60 mL/min signify possible Chronic Kidney Disease.    Anion gap 14 5 - 15  Glucose, capillary     Status: Abnormal   Collection Time: 11/22/15  6:23 AM  Result Value Ref Range   Glucose-Capillary 177 (H) 65 - 99 mg/dL   Comment 1 Notify RN    Comment 2  Document in Chart    Mr Brain Wo Contrast  11/21/2015  CLINICAL DATA:  70 year old hypertensive male with headache and right-sided weakness. Subsequent encounter. EXAM: MRI HEAD WITHOUT CONTRAST TECHNIQUE: Multiplanar, multiecho pulse sequences of the brain and surrounding structures were obtained without intravenous contrast. COMPARISON:  11/18/2015 MR and CT. FINDINGS: Interval development of scattered small nonhemorrhagic infarcts medial aspect of the posterior frontal and parietal lobes  bilaterally. Remote left thalamic infarct. Blood breakdown products right cerebellum and posterior right temporal -occipital junction suggestive of result of prior hemorrhagic insult. Mild chronic microvascular changes. Global atrophy. Prominent extra-axial spaces over the convexities. Although unchanged from the recent examination, slight increase in size when compared to 2012 (now measuring 1.1 cm on the right and 1.5 cm on the left versus remote 0.9 cm on the right and 1.4 cm on the left in 2012). Inward and slight downward displacement of adjacent brain parenchyma. 3.8 mm midline shift to the left. This may represent the presence of bilateral subdural hygromas. No intracranial mass lesion noted on this unenhanced exam. Major intracranial vascular structures are patent. Superimposed atherosclerotic changes of the vertebral arteries suspected. This is more notable involving the right vertebral artery. Partially empty non expanded sella. Cervical spondylotic changes C3-4. Cervical medullary junction unremarkable. IMPRESSION: Interval development of scattered small nonhemorrhagic infarcts medial aspect of the posterior frontal and parietal lobes bilaterally. Remainder of findings similar to the recent exam including: Remote left thalamic infarct. Blood breakdown products right cerebellum and posterior right temporal -occipital junction suggestive of result of prior hemorrhagic insult. Mild chronic microvascular changes. Global  atrophy. Prominent extra-axial spaces over the convexities. Although unchanged from the recent examination, slight increase in size when compared to 2012. Inward and slight downward displacement of adjacent brain parenchyma. 3.8 mm midline shift to the left. This may represent the presence of bilateral subdural hygromas. Major intracranial vascular structures are patent. Superimposed atherosclerotic changes of the vertebral arteries suspected. This is more notable involving the right vertebral artery. Electronically Signed   By: Genia Del M.D.   On: 11/21/2015 18:20       Medical Problem List and Plan: 1.  Mobilty,cognitive and functional deficits secondary to bi-cerebral embolic CVA   -admit to inpatient rehab 2.  DVT Prophylaxis/Anticoagulation: Pharmaceutical: Coumadin   -pt subtherapeutic upon admision 3. Pain Management: tylenol and tramadol prn for headache  -bp control over time 4. Mood: team to provide ego support 5. Neuropsych: This patient is not capable of making decisions on his own behalf--question of mild cognitive deficits at baseline.  6. Skin/Wound Care: routine pressure relief measures. Add supplements between meals.  7. Fluids/Electrolytes/Nutrition:  Monitor I/O. Check lytes in am. 8. HTN: permissive hypertension with prn hydralazine only for SBP >220 or DBP >120.  -hold catapres, hctz, lisinopril for now and gradually reintroduce 9. A Fib: cardizem/warfarin. Monitor HR with exertion 10 Hyponatremia: mild. Encourage appropriate nutrition 11. CKD: Cr 2.37 today. Follow i's and o's, check serial labs 12. H/o gout: allopurinol 13. Gastric ulcers/GIB:  Protonix, monitor any bleeding while on warfarin  -hgb 13.9      Post Admission Physician Evaluation: 1. Functional deficits secondary  to embolic CVA. 2. Patient is admitted to receive collaborative, interdisciplinary care between the physiatrist, rehab nursing staff, and therapy team. 3. Patient's level of medical  complexity and substantial therapy needs in context of that medical necessity cannot be provided at a lesser intensity of care such as a SNF. 4. Patient has experienced substantial functional loss from his/her baseline which was documented above under the "Functional History" and "Functional Status" headings.  Judging by the patient's diagnosis, physical exam, and functional history, the patient has potential for functional progress which will result in measurable gains while on inpatient rehab.  These gains will be of substantial and practical use upon discharge  in facilitating mobility and self-care at the household level. 5. Physiatrist will provide 24 hour management  of medical needs as well as oversight of the therapy plan/treatment and provide guidance as appropriate regarding the interaction of the two. 6. 24 hour rehab nursing will assist with bladder management, bowel management, safety, skin/wound care, disease management, medication administration, pain management and patient education  and help integrate therapy concepts, techniques,education, etc. 7. PT will assess and treat for/with: Lower extremity strength, range of motion, stamina, balance, functional mobility, safety, adaptive techniques and equipment, NMR, visual-spatial awareness, pain mgt, family ed, ego support.   Goals are: supervision. 8. OT will assess and treat for/with: ADL's, functional mobility, safety, upper extremity strength, adaptive techniques and equipment, NMR, visual-spatial awareness, pain mgt, family ed, community reintegration.   Goals are: supervision. Therapy may proceed with showering this patient. 9. SLP will assess and treat for/with: cognition, communication, family ed.  Goals are: supervision to mod I. 10. Case Management and Social Worker will assess and treat for psychological issues and discharge planning. 11. Team conference will be held weekly to assess progress toward goals and to determine barriers to  discharge. 12. Patient will receive at least 3 hours of therapy per day at least 5 days per week. 13. ELOS: 14-18 days       14. Prognosis:  excellent     Meredith Staggers, MD, [place] [facility] 11/22/2015   11/22/2015

## 2015-11-22 NOTE — Progress Notes (Signed)
Subjective:  Patient says his headache and lower extremity is unimproved since yesterday. He wonders if there is anything besides tylenol for his headache.   Objective: Vital signs in last 24 hours: Filed Vitals:   11/22/15 0150 11/22/15 0500 11/22/15 0543 11/22/15 0549  BP: 152/114  237/117 203/145  Pulse: 101  107   Temp: 98.8 F (37.1 C)  98.9 F (37.2 C)   TempSrc: Oral  Oral   Resp: 20  20   Height:      Weight:  175 lb (79.379 kg)    SpO2: 99%  95%    Weight change: -1 lb (-0.454 kg)  Intake/Output Summary (Last 24 hours) at 11/22/15 0840 Last data filed at 11/22/15 0520  Gross per 24 hour  Intake      0 ml  Output    700 ml  Net   -700 ml   Physical Exam General: Lying in bed, appearing uncomfortable Cardiovascular: Irregularly irregular rhythm, no murmurs, rubs, or gallops. Pulmonary: CTAB, unlabored breathing Abdominal: Soft, NT/ND. Normal bowel sounds Extremities: No clubbing, cyanosis, or edema Neurological: AAOx3, did not know president. Speech normal except as below (see psychiatric). AAOx3 (he did not know the President today). EOMI, 4+/5 strength in RUE, 5/5 LUE, 4-/5 in RLE. 4+/5 strength in LLE  Psychiatric: Blunted affect with minimal spontaneous speech. Poor eye contact.   Lab Results: Basic Metabolic Panel:  Recent Labs Lab 11/21/15 0210 11/22/15 0540  NA 137 134*  K 3.8 3.5  CL 101 98*  CO2 24 22  GLUCOSE 199* 179*  BUN 27* 28*  CREATININE 2.33* 2.37*  CALCIUM 9.0 9.3  MG 1.7 1.7  PHOS 2.9 3.5   Liver Function Tests:  Recent Labs Lab 11/15/15 1143 11/18/15 1359  AST 13 23  ALT 11 13*  ALKPHOS 85 76  BILITOT 0.6 1.0  PROT 7.1 7.8  ALBUMIN 4.3 4.4   CBC:  Recent Labs Lab 11/18/15 1359 11/22/15 0540  WBC 7.1 6.1  NEUTROABS 5.9  --   HGB 12.9* 13.9  HCT 40.6 42.1  MCV 80.1 77.2*  PLT 241 196    CBG:  Recent Labs Lab 11/21/15 0641 11/21/15 1124 11/21/15 1214 11/21/15 1807 11/21/15 2155 11/22/15 0623    GLUCAP 188* 214* 205* 192* 143* 177*   Coagulation:  Recent Labs Lab 11/18/15 1359 11/20/15 0424 11/21/15 0210 11/22/15 0540  LABPROT 21.6* 32.1* 32.3* 30.1*  INR 1.97* 3.20* 3.22* 2.94*   Urinalysis:  Recent Labs Lab 11/18/15 1701  COLORURINE YELLOW  LABSPEC 1.014  PHURINE 6.5  GLUCOSEU 100*  HGBUR SMALL*  BILIRUBINUR NEGATIVE  KETONESUR NEGATIVE  PROTEINUR >300*  NITRITE NEGATIVE  LEUKOCYTESUR NEGATIVE    Studies/Results: Mr Brain Wo Contrast  11/21/2015  CLINICAL DATA:  70 year old hypertensive male with headache and right-sided weakness. Subsequent encounter. EXAM: MRI HEAD WITHOUT CONTRAST TECHNIQUE: Multiplanar, multiecho pulse sequences of the brain and surrounding structures were obtained without intravenous contrast. COMPARISON:  11/18/2015 MR and CT. FINDINGS: Interval development of scattered small nonhemorrhagic infarcts medial aspect of the posterior frontal and parietal lobes bilaterally. Remote left thalamic infarct. Blood breakdown products right cerebellum and posterior right temporal -occipital junction suggestive of result of prior hemorrhagic insult. Mild chronic microvascular changes. Global atrophy. Prominent extra-axial spaces over the convexities. Although unchanged from the recent examination, slight increase in size when compared to 2012 (now measuring 1.1 cm on the right and 1.5 cm on the left versus remote 0.9 cm on the right and 1.4 cm on  the left in 2012). Inward and slight downward displacement of adjacent brain parenchyma. 3.8 mm midline shift to the left. This may represent the presence of bilateral subdural hygromas. No intracranial mass lesion noted on this unenhanced exam. Major intracranial vascular structures are patent. Superimposed atherosclerotic changes of the vertebral arteries suspected. This is more notable involving the right vertebral artery. Partially empty non expanded sella. Cervical spondylotic changes C3-4. Cervical medullary  junction unremarkable. IMPRESSION: Interval development of scattered small nonhemorrhagic infarcts medial aspect of the posterior frontal and parietal lobes bilaterally. Remainder of findings similar to the recent exam including: Remote left thalamic infarct. Blood breakdown products right cerebellum and posterior right temporal -occipital junction suggestive of result of prior hemorrhagic insult. Mild chronic microvascular changes. Global atrophy. Prominent extra-axial spaces over the convexities. Although unchanged from the recent examination, slight increase in size when compared to 2012. Inward and slight downward displacement of adjacent brain parenchyma. 3.8 mm midline shift to the left. This may represent the presence of bilateral subdural hygromas. Major intracranial vascular structures are patent. Superimposed atherosclerotic changes of the vertebral arteries suspected. This is more notable involving the right vertebral artery. Electronically Signed   By: Genia Del M.D.   On: 11/21/2015 18:20   Medications: I have reviewed the patient's current medications. Scheduled Meds: . allopurinol  200 mg Oral Daily  . aspirin EC  81 mg Oral Daily  . diclofenac sodium  4 g Topical QID  . diltiazem  120 mg Oral Daily  . insulin aspart  0-20 Units Subcutaneous TID WC  . insulin aspart  0-5 Units Subcutaneous QHS  . polyethylene glycol  17 g Oral Daily  . Warfarin - Pharmacist Dosing Inpatient   Does not apply q1800   Continuous Infusions:  PRN Meds:.acetaminophen, hydrALAZINE, senna-docusate Assessment/Plan:  Cerebrovascular Accident: Patient has newly developing infarcts in the left precentral gyrus and right postcentral gyrus on 4/25 MRI. The former infarct would be consistent with his worsening RLE weakness. Unclear when last known normal was, therefore he is not a tPA candidate. Multiple sites suggest embolic stroke, and patient had not been taking his warfarin and was subtherapeutic on  admission (1.97). We will re-consult neurology and PM&R for evaluation for CIR. From stroke workup earlier in admission TTE and  Carotid dopplers were reassuring. - Permissive HTN, prn hydralazine only for SBP >220 or DBP over 120 - Awaiting further neuro recommendations. - ASA 81 mg  Hypertensive Emergency secondary to Clonidine Withdrawal: Patient's blood pressures continues to be erratic and still has a headache. Given his new CVA, permissive HTN as above;  - Holding lisinopril, clonidine, HCTZ for the next 24-48 hours.  Chronic Atrial Fibrillation (CHA2DS2-VASc 6): INR 2.94 this AM. - Diltiazem 24 hr 120 mg daily - Warfarin per pharm  T2DM: On metformin 24 hr tablet 1000 mg daily at home. CBGs in 100s today. - SSI-R  DVT PPx: On Warfarin  Dispo: Disposition is deferred at this time, awaiting improvement of current medical problems.  Anticipated discharge in approximately 1-2 day(s). Awaiting evaluation for CIR.  The patient does have a current PCP Milagros Loll, MD) and does need an [facility] hospital follow-up appointment after discharge.  The patient does have transportation limitations that hinder transportation to clinic appointments.  .Services Needed at time of discharge: Y = Yes, Blank = No PT:   OT:   RN:   Equipment:   Other:     LOS: 4 days   Liberty Handy, MD 11/22/2015, 8:40 AM

## 2015-11-22 NOTE — Interval H&P Note (Signed)
Patrick [name] was admitted today to Inpatient Rehabilitation with the diagnosis of bi-cerebral infarct.  The patient's history has been reviewed, patient examined, and there is no change in status.  Patient continues to be appropriate for intensive inpatient rehabilitation.  I have reviewed the patient's chart and labs.  Questions were answered to the patient's satisfaction. The PAPE has been reviewed and assessment remains appropriate.  SWARTZ,ZACHARY T 11/22/2015, 5:26 PM

## 2015-11-22 NOTE — Progress Notes (Signed)
Patient is discharged from room 5M20  And transferred to unit MW at this time. Alert and in stable condition. Report given to receiving nurse Pryor Montes, RN. Transported via bed with all belongings at side.

## 2015-11-22 NOTE — Progress Notes (Signed)
Physical Therapy Treatment Patient Details Name: Patrick [name] MRN: [id number] DOB: 09/11/1945 Today's Date: 11/22/2015    History of Present Illness 70 y.o. male with past med hx HTN, CKD 3-4, a-fib on Coumadin, CVA, DM2 presenting for Headache for 1 wk and vomiting for a several days. He has also noted weakness in both legs. Upon arrival to the ED SBP was > 270 . He states he has been taking his medications appropriately but was not able to take them this AM due to vomiting.  PMH positive for Hypertension; CKD, stage II; CAD; Depression; Gout; Hyperlipidemia; CVA; GERD; Diabetes mellitus type II; Upper GI bleeding; Paroxysmal atrial fibrillation; Atrial fibrillation; Myocardial infarction; Shortness of breath; Sleep apnea; Stomach ulcer; Stroke; Permanent atrial fibrillation; Chronic anticoagulation, on coumadin; and Tachy-brady syndrome    PT Comments    Patient progressing slowly towards PT goals. Continues to have impaired sitting balance especially during dynamic activities but able to self correct to midline with multimodal cues. HR up to 150 bpm during session requiring frequent rest breaks. Tolerated standing balance with assist of 2 due to right knee instability and balance deficits. Will continue to follow.   Follow Up Recommendations  CIR     Equipment Recommendations  Other (comment) (TBD)    Recommendations for Other Services       Precautions / Restrictions Precautions Precautions: Fall Restrictions Weight Bearing Restrictions: No    Mobility  Bed Mobility Overal bed mobility: Needs Assistance Bed Mobility: Supine to Sit;Sit to Supine     Supine to sit: Mod assist;+2 for physical assistance;HOB elevated Sit to supine: Mod assist;+2 for physical assistance   General bed mobility comments: Assist with trunk and to scoot bottom to EOB. Pt able to initiate movement of BLEs with increased time and cues. Assist to bring BLEs into bed.   Transfers Overall transfer  level: Needs assistance Equipment used: Rolling walker (2 wheeled) (chair) Transfers: Sit to/from Stand Sit to Stand: Mod assist;+2 physical assistance;From elevated surface         General transfer comment: Assist of 2 to stand from EOB with cues for hand placement/technique with therapist blocking Right knee. Increased knee flexion bilaterally. Manual cues for upright posture and hip extension. Stood x4 from EOB. HR up to 150 bpm.   Ambulation/Gait                 Stairs            Wheelchair Mobility    Modified Rankin (Stroke Patients Only) Modified Rankin (Stroke Patients Only) Pre-Morbid Rankin Score: Slight disability Modified Rankin: Moderately severe disability     Balance Overall balance assessment: Needs assistance Sitting-balance support: Feet supported;No upper extremity supported Sitting balance-Leahy Scale: Poor Sitting balance - Comments: External support sitting EOB esp during dynamic activities -UE dressing, washing off body etc with lean to the right. Provided postural cues for upright trunk. Sat EOB ~15 minutes.   Standing balance support: During functional activity Standing balance-Leahy Scale: Poor Standing balance comment: Max A of 2 for static standing with BUEs supported on chair. Stood for ~1 minute, 30 sec, 20 sec. Cues provided for upright posture. Therapist blocking right knee. Fatigued quickly.                    Cognition Arousal/Alertness: Awake/alert Behavior During Therapy: Flat affect Overall Cognitive Status: Impaired/Different from baseline Area of Impairment: Problem solving;Safety/judgement         Safety/Judgement: Decreased awareness of deficits;Decreased awareness of safety  Problem Solving: Slow processing      Exercises      General Comments General comments (skin integrity, edema, etc.): Wife present during session.      Pertinent Vitals/Pain Pain Assessment: 0-10 Pain Score: 2  Pain Location:  headache Pain Descriptors / Indicators: Headache Pain Intervention(s): Monitored during session;Repositioned    Home Living                      Prior Function            PT Goals (current goals can now be found in the care plan section) Progress towards PT goals: Progressing toward goals    Frequency  Min 4X/week    PT Plan Current plan remains appropriate    Co-evaluation             End of Session Equipment Utilized During Treatment: Gait belt Activity Tolerance: Patient limited by fatigue;Patient tolerated treatment well Patient left: in bed;with call bell/phone within reach;with bed alarm set;with family/visitor present     Time: [id number] PT Time Calculation (min) (ACUTE ONLY): 33 min  Charges:  $Therapeutic Activity: 8-22 mins                    G Codes:      Vendela Troung A Reggie Bise 11/22/2015, 3:41 PM  Wray Kearns, [place], DPT [phone]

## 2015-11-22 NOTE — Progress Notes (Signed)
STROKE TEAM PROGRESS NOTE   SUBJECTIVE (INTERVAL HISTORY) The patient's sons and wife are at the bedside. Pt seems to have significant RLE weakness but he denies any change. RUE also weak but still against gravity. MRI showed several punctate infarcts at bilateral medial frontal b/l ACA territory. Not able to fully explain the deficit.    OBJECTIVE Temp:  [98.2 F (36.8 C)-98.9 F (37.2 C)] 98.9 F (37.2 C) (04/25 1351) Pulse Rate:  [64-107] 94 (04/25 1351) Cardiac Rhythm:  [-] Atrial fibrillation (04/25 0900) Resp:  [18-20] 18 (04/25 1351) BP: (152-237)/(96-156) 187/122 mmHg (04/25 1351) SpO2:  [95 %-100 %] 100 % (04/25 1351) Weight:  [175 lb (79.379 kg)] 175 lb (79.379 kg) (04/25 0500)  CBC:   Recent Labs Lab 11/18/15 1359 11/22/15 0540  WBC 7.1 6.1  NEUTROABS 5.9  --   HGB 12.9* 13.9  HCT 40.6 42.1  MCV 80.1 77.2*  PLT 241 [id number]    Basic Metabolic Panel:   Recent Labs Lab 11/21/15 0210 11/22/15 0540  NA 137 134*  K 3.8 3.5  CL 101 98*  CO2 24 22  GLUCOSE 199* 179*  BUN 27* 28*  CREATININE 2.33* 2.37*  CALCIUM 9.0 9.3  MG 1.7 1.7  PHOS 2.9 3.5    Lipid Panel:     Component Value Date/Time   CHOL 128 11/20/2015 0424   TRIG 77 11/20/2015 0424   HDL 48 11/20/2015 0424   CHOLHDL 2.7 11/20/2015 0424   VLDL 15 11/20/2015 0424   LDLCALC 65 11/20/2015 0424   HgbA1c:  Lab Results  Component Value Date   HGBA1C 7.7 08/29/2015   Urine Drug Screen:     Component Value Date/Time   LABOPIA NONE DETECTED 11/22/2010 0831   COCAINSCRNUR NONE DETECTED 11/22/2010 0831   LABBENZ NONE DETECTED 11/22/2010 0831   AMPHETMU NONE DETECTED 11/22/2010 0831   THCU NONE DETECTED 11/22/2010 0831   LABBARB  11/22/2010 0831    NONE DETECTED        DRUG SCREEN FOR MEDICAL PURPOSES ONLY.  IF CONFIRMATION IS NEEDED FOR ANY PURPOSE, NOTIFY LAB WITHIN 5 DAYS.        LOWEST DETECTABLE LIMITS FOR URINE DRUG SCREEN Drug Class       Cutoff (ng/mL) Amphetamine       1000 Barbiturate      200 Benzodiazepine   [id number] Tricyclics       [id number] Opiates          300 Cocaine          300 THC              50      IMAGING I have personally reviewed the radiological images below and agree with the radiology interpretations.  Ct Head Wo Contrast 11/18/2015   1. Stable, prominent CSF spaces bilaterally at the convexities.  2. Chronic lacunar infarct of the left thalamus.  3.  No evidence for acute intracranial abnormality.   Mr Brain Wo Contrast 11/18/2015   1. No acute intracranial abnormality.  2. Chronic ischemic disease with mild progression in the posterior right MCA territory since 2012.  3. Chronic increased subarachnoid space CSF over both cerebral convexities. There may now be tiny superimposed subdural hygromas, but significance is doubtful.  CUS  11/20/2015 Preliminary findings: Bilateral: No significant (1-39%) ICA stenosis. Antegrade vertebral flow.   TTE  11/20/2015 Study Conclusions - Left ventricle: The cavity size was normal. Wall thickness was  increased in a pattern of moderate to severe  LVH. Systolic  function was mildly to moderately reduced. The estimated ejection  fraction was in the range of 40% to 45%. Wall motion was normal;  there were no regional wall motion abnormalities. - Aortic valve: There was mild regurgitation. - Mitral valve: Calcified annulus. - Left atrium: The atrium was moderately dilated.  Mr Brain Wo Contrast  11/21/2015  IMPRESSION: Interval development of scattered small nonhemorrhagic infarcts medial aspect of the posterior frontal and parietal lobes bilaterally. Remainder of findings similar to the recent exam including: Remote left thalamic infarct. Blood breakdown products right cerebellum and posterior right temporal -occipital junction suggestive of result of prior hemorrhagic insult. Mild chronic microvascular changes. Global atrophy. Prominent extra-axial spaces over the convexities. Although unchanged  from the recent examination, slight increase in size when compared to 2012. Inward and slight downward displacement of adjacent brain parenchyma. 3.8 mm midline shift to the left. This may represent the presence of bilateral subdural hygromas. Major intracranial vascular structures are patent. Superimposed atherosclerotic changes of the vertebral arteries suspected. This is more notable involving the right vertebral artery.    PHYSICAL EXAM  Temp:  [98.2 F (36.8 C)-98.9 F (37.2 C)] 98.9 F (37.2 C) (04/25 1351) Pulse Rate:  [64-107] 94 (04/25 1351) Resp:  [18-20] 18 (04/25 1351) BP: (152-237)/(96-156) 187/122 mmHg (04/25 1351) SpO2:  [95 %-100 %] 100 % (04/25 1351) Weight:  [175 lb (79.379 kg)] 175 lb (79.379 kg) (04/25 0500)  General - Well nourished, well developed, in no apparent distress, depressed mood.  Ophthalmologic - Fundi not visualized due to eye movement.  Cardiovascular - irregularly irregular heart rate and rhythm.  Mental Status -  Level of arousal and orientation to time, place, and person were intact. Language including expression, repetition, comprehension was assessed and found intact. Naming 2/3 and psychomotor slowing Fund of Knowledge was assessed and was intact.  Cranial Nerves II - XII - II - Visual field intact OU. III, IV, VI - Extraocular movements intact. V - Facial sensation intact bilaterally. VII - Facial movement intact bilaterally. VIII - Hearing & vestibular intact bilaterally. X - Palate elevates symmetrically. XI - Chin turning & shoulder shrug intact bilaterally. XII - Tongue protrusion intact.  Motor Strength - The patient's strength was 3+/5 RUE and 0/5 RLE, but 5/5 LUE and LLE and pronator drift was absent.  Bulk was normal and fasciculations were absent.   Motor Tone - Muscle tone was assessed at the neck and appendages and was normal.  Reflexes - The patient's reflexes were 1+ in all extremities and he had no pathological  reflexes.  Sensory - Light touch, temperature/pinprick were assessed and were symmetrical.    Coordination - The patient had normal movements in the left hand with no ataxia or dysmetria.  Tremor was absent.  Gait and Station - deferred due to safety concerns.    ASSESSMENT/PLAN [name] is a 69 y.o. male with history of hypertension, medical noncompliance secondary to finances,  chronic kidney disease, coronary artery disease with previous MI, gout, hyperlipidemia, previous stroke, diabetes mellitus, history of upper GI bleed, atrial fibrillation on warfarin (INR 1.97), and OSA, presenting with severe hypertension and headache. He did not receive IV t-PA due to a negative MRI, late presentation, and warfarin therapy.  Hypertensive emergency  BP still high  On clonidine, cardizam  Permissive hypertension (OK if <220/120) for 24-48 hours post stroke and then gradually normalized within 5-7 days.  Management as per primary team  Stroke: scattered several punctate infarcts  at b/l ACA territory - difficult to explain the right UE and LE weakness. Pt significant psychomotor slowing and in depression. Need continued rehab  Resultant  resolution of deficits  MRI - negative for stroke  Carotid Doppler unremarkable   2D Echo - EF 40-45%. No cardiac source of emboli identified.  LDL - 65  HgbA1c pending  VTE prophylaxis - warfarin Diet heart healthy/carb modified Room service appropriate?: Yes; Fluid consistency:: Thin Diet - low sodium heart healthy  aspirin 81 mg daily and warfarin daily prior to admission, now on aspirin 81 mg daily and warfarin daily. INR therapeutic.   Patient counseled to be compliant with his antithrombotic medications  Ongoing aggressive stroke risk factor management  Therapy recommendations: CIR   Disposition: Pending  Chronic afib on coumadin  INR 1.97 on admission, today 3.2  INR daily monitoring  INR goal 2-3  Not DOAC candidate due  to CKD  On cardiazem  History of stroke  Left thalamic 10/2010  No significant residue as per son  Hypertension  Blood pressure running mildly high  On cardene drip  Continue home po meds  Wean off cardene as able  Hyperlipidemia  Home meds:  Pravachol 80 mg daily resumed in hospital  LDL 65, goal < 70  Continue statin at discharge  Diabetes  HgbA1c pending, goal < 7.0  Uncontrolled  SSI  Management as per primary team  Other Stroke Risk Factors  Advanced age  Cigarette smoker, quit smoking 39 years ago.  Hx stroke/TIA  Coronary artery disease  Obstructive sleep apnea  Other Active Problems  Chronic kidney disease  Hospital day # 4  Neurology will sign off. Please call with questions. Follow up in clinic in 2 months with me. Thanks for the consult.  Rosalin Hawking, MD PhD Stroke Neurology 11/22/2015 3:26 PM   To contact Stroke Continuity provider, please refer to [email or web address] After hours, contact General Neurology

## 2015-11-22 NOTE — Progress Notes (Signed)
Patient refused CPAP for the night  

## 2015-11-22 NOTE — Discharge Summary (Signed)
Name: Patrick [name] MRN: [id number] DOB: November 02, 1945 70 y.o. PCP: Milagros Loll, MD  Date of Admission: 11/18/2015  1:40 PM Date of Discharge: 11/22/2015 Attending Physician: Aldine Contes, MD  Discharge Diagnosis: 1. Recurrent Cerebrovascular Accident 2. Hypertensive Emergency 3. Chronic Atrial Fibrillation (CHADS-VASc 6) 4. CKD Stage III 5. T2DM  Discharge Medications:   Medication List    STOP taking these medications        furosemide 20 MG tablet  Commonly known as:  LASIX      TAKE these medications        ACCU-CHEK AVIVA PLUS w/Device Kit  1 each by Does not apply route as needed.     allopurinol 100 MG tablet  Commonly known as:  ZYLOPRIM  TAKE TWO TABLETS BY MOUTH ONCE DAILY     aspirin 81 MG EC tablet  Take 81 mg by mouth daily.     cloNIDine 0.3 MG tablet  Commonly known as:  CATAPRES  Take 1 tablet (0.3 mg total) by mouth 3 (three) times daily. Please resume the morning of 11/23/15.     diclofenac sodium 1 % Gel  Commonly known as:  VOLTAREN  Apply 4 g topically 4 (four) times daily.     diltiazem 120 MG 24 hr capsule  Commonly known as:  DILACOR XR  Take 1 capsule (120 mg total) by mouth daily.     glucose blood test strip  Commonly known as:  TRUETRACK TEST  Use to test blood sugar 2-3 times daily dx code 250.00     hydrALAZINE 10 MG tablet  Commonly known as:  APRESOLINE  Take 1 tablet (10 mg total) by mouth 4 (four) times daily as needed. Take for SBP >220 or DBP >120.     hydrochlorothiazide 12.5 MG tablet  Commonly known as:  HYDRODIURIL  Take 1 tablet (12.5 mg total) by mouth daily.     Lancets 30G Misc  Use to test blood glucose 2-3 times daily dx code 250.00     lisinopril 20 MG tablet  Commonly known as:  PRINIVIL,ZESTRIL  Take 1 tablet (20 mg total) by mouth daily. Hold until 11/23/15.     metFORMIN 500 MG 24 hr tablet  Commonly known as:  GLUCOPHAGE-XR  TAKE 2 TABLETS BY MOUTH WITH BREAKFAST     polyethylene glycol  packet  Commonly known as:  MIRALAX / GLYCOLAX  Take 17 g by mouth daily.     pravastatin 80 MG tablet  Commonly known as:  PRAVACHOL  Take 1 tablet (80 mg total) by mouth daily.     senna-docusate 8.6-50 MG tablet  Commonly known as:  Senokot-S  Take 1 tablet by mouth 2 (two) times daily.     warfarin 5 MG tablet  Commonly known as:  COUMADIN  Take 1 tablet (5 mg total) by mouth daily at 6 PM.        Disposition and follow-up:   [name] was discharged from [facility] in stable condition.  At the hospital follow up visit please address:  1.  Patient noted to have a new CVA 24 hours prior to discharge on MRI that was not present on admission. Therefore, until tomorrow morning (11/23/15), our hypertension management strategy will be permissive. This will include only:  Hydralazine 10 mg po 4 times daily prn SBP >220 or DBP >120  He is to remain on diltiazem 120 mg daily for his chronic atrial fibrillation.  The morning of 11/23/15, you may  resume the following medications  Clonidine 0.3 mg TID Hydrochlorothiazide 12.5 mg daily Lisinopril 20 mg daily  Should he develop worsening headache or vision changes, please check blood pressure to assess need for prn hydralazine.  2.  Labs / imaging needed at time of follow-up: None  3.  Pending labs/ test needing follow-up: None  Follow-up Appointments: Follow-up Information    Schedule an appointment as soon as possible for a visit with Jacques Earthly, MD.   Specialty:  Internal Medicine   Why:  Make an appointment after you have completed Rehab.   Contact information:   [place] [address] [phone]       Discharge Instructions:  [name],  It was a pleasure taking care of you in the hospital.  Best of luck in rehabilitation. We hope this will help you get your strength back from your stroke.   Until tomorrow, you will only be on an as needed blood pressure medication  (hydralazine 10 mg by mouth four times daily as needed for systolic blood pressure over 469 or diastolic blood pressure over 120). Tomorrow morning, you will start on the following blood pressure medications:  Clonidine 0.3 mg three times a day Lisinopril 20 mg daily HCTZ 12.5 mg daily  We have discontinued your lasix.   Over time, it may be a good idea to to come off the clonidine SLOWLY and under the guidance of a physician. Until then, it is VERY IMPORTANT that you continue to take clonidine to prevent severe high blood pressure.  You can continue to resume your other home medications.  Discharge Instructions    Diet - low sodium heart healthy    Complete by:  As directed      Increase activity slowly    Complete by:  As directed            Consultations:    Procedures Performed:  Ct Head Wo Contrast  11/18/2015  CLINICAL DATA:  "Per pt, states he started having a severe headache causing emesis yesterday-states he is unable to move both legs-states history of stroke" EXAM: CT HEAD WITHOUT CONTRAST TECHNIQUE: Contiguous axial images were obtained from the base of the skull through the vertex without intravenous contrast. COMPARISON:  11/15/2015 FINDINGS: There is central and cortical atrophy. Periventricular white matter changes are consistent with small vessel disease. Prominent CSF spaces again noted along the convexities appear stable. There is no intra or extra-axial fluid collection or mass lesion. The basilar cisterns and ventricles have a normal appearance. There is no CT evidence for acute infarction or hemorrhage. Remote lacunar infarct is identified within the left thalamus. Bone windows are unremarkable. There is atherosclerotic calcification both internal carotid arteries. IMPRESSION: 1. Stable, prominent CSF spaces bilaterally at the convexities. 2. Chronic lacunar infarct of the left thalamus. 3.  No evidence for acute intracranial abnormality. Electronically Signed   By:  Nolon Nations M.D.   On: 11/18/2015 14:45   Ct Head Wo Contrast  11/15/2015  CLINICAL DATA:  Leg aching for 4 or 5 days. Headache today. History of CVA and anti coagulation. EXAM: CT HEAD WITHOUT CONTRAST TECHNIQUE: Contiguous axial images were obtained from the base of the skull through the vertex without intravenous contrast. COMPARISON:  Head CT 11/22/2010.  MRI brain 11/23/2010. FINDINGS: There is no evidence of acute intracranial hemorrhage, mass lesion, new extra-axial fluid collection or hydrocephalus. Prominent CSF spaces along both convexities are stable. There is no midline shift. There has been interval  of evolution of the previously demonstrated left thalamic infarct. No evidence of cortical based infarct. Intracranial vascular calcifications are present. The visualized paranasal sinuses, mastoid air cells and middle ears are clear. The calvarium is intact. IMPRESSION: 1. Interval evolution of previously noted left thalamic infarct. 2. No acute intracranial findings. Electronically Signed   By: Richardean Sale M.D.   On: 11/15/2015 13:38   Mr Brain Wo Contrast  11/21/2015  CLINICAL DATA:  70 year old hypertensive male with headache and right-sided weakness. Subsequent encounter. EXAM: MRI HEAD WITHOUT CONTRAST TECHNIQUE: Multiplanar, multiecho pulse sequences of the brain and surrounding structures were obtained without intravenous contrast. COMPARISON:  11/18/2015 MR and CT. FINDINGS: Interval development of scattered small nonhemorrhagic infarcts medial aspect of the posterior frontal and parietal lobes bilaterally. Remote left thalamic infarct. Blood breakdown products right cerebellum and posterior right temporal -occipital junction suggestive of result of prior hemorrhagic insult. Mild chronic microvascular changes. Global atrophy. Prominent extra-axial spaces over the convexities. Although unchanged from the recent examination, slight increase in size when compared to 2012 (now measuring  1.1 cm on the right and 1.5 cm on the left versus remote 0.9 cm on the right and 1.4 cm on the left in 2012). Inward and slight downward displacement of adjacent brain parenchyma. 3.8 mm midline shift to the left. This may represent the presence of bilateral subdural hygromas. No intracranial mass lesion noted on this unenhanced exam. Major intracranial vascular structures are patent. Superimposed atherosclerotic changes of the vertebral arteries suspected. This is more notable involving the right vertebral artery. Partially empty non expanded sella. Cervical spondylotic changes C3-4. Cervical medullary junction unremarkable. IMPRESSION: Interval development of scattered small nonhemorrhagic infarcts medial aspect of the posterior frontal and parietal lobes bilaterally. Remainder of findings similar to the recent exam including: Remote left thalamic infarct. Blood breakdown products right cerebellum and posterior right temporal -occipital junction suggestive of result of prior hemorrhagic insult. Mild chronic microvascular changes. Global atrophy. Prominent extra-axial spaces over the convexities. Although unchanged from the recent examination, slight increase in size when compared to 2012. Inward and slight downward displacement of adjacent brain parenchyma. 3.8 mm midline shift to the left. This may represent the presence of bilateral subdural hygromas. Major intracranial vascular structures are patent. Superimposed atherosclerotic changes of the vertebral arteries suspected. This is more notable involving the right vertebral artery. Electronically Signed   By: Genia Del M.D.   On: 11/21/2015 18:20   Mr Brain Wo Contrast  11/18/2015  CLINICAL DATA:  70 year old male with headache and vomiting. Bilateral lower extremity weakness. Gait instability. Hypertensive, 347 mmHg systolic. Initial encounter. EXAM: MRI HEAD WITHOUT CONTRAST TECHNIQUE: Multiplanar, multiecho pulse sequences of the brain and surrounding  structures were obtained without intravenous contrast. COMPARISON:  Head CT without contrast 1431 hours today. Brain MRI 11/22/2010. FINDINGS: Major intracranial vascular flow voids are stable since 2012. No restricted diffusion or evidence of acute infarction. Chronic bilateral extra-axial CSF over both cerebral convexities has mildly increased since 2012, and appears mostly due to subarachnoid space CSF. However, there might be tiny associated subdural hygromas (series 10, image 16). Basilar cisterns remain patent. No significant associated intracranial mass effect. There are chronic lacunar infarcts in the left thalamus and right cerebellum. Chronic hemorrhage in the right cerebellar tonsil is unchanged. Chronic micro hemorrhage in the lateral right occipital lobe is new since 2012. There is an associated nearby small area of cortical encephalomalacia in the posterior right temporal lobe best seen on series 7, image 16. Scattered small  nonspecific cerebral white matter T2 and FLAIR hyperintense foci also have increased. No acute intracranial hemorrhage identified. No ventriculomegaly. Negative pituitary, cervicomedullary junction and visualized cervical spine. Visualized bone marrow signal is within normal limits. Visible internal auditory structures appear normal. Mastoids are clear. Chronic but improved mild ethmoid sinus mucosal thickening. Negative orbit and scalp soft tissues. IMPRESSION: 1.  No acute intracranial abnormality. 2. Chronic ischemic disease with mild progression in the posterior right MCA territory since 2012. 3. Chronic increased subarachnoid space CSF over both cerebral convexities. There may now be tiny superimposed subdural hygromas, but significance is doubtful. Electronically Signed   By: Genevie Ann M.D.   On: 11/18/2015 18:07    2D Echo:   Study Conclusions  - Left ventricle: The cavity size was normal. Wall thickness was  increased in a pattern of moderate to severe LVH.  Systolic  function was mildly to moderately reduced. The estimated ejection  fraction was in the range of 40% to 45%. Wall motion was normal;  there were no regional wall motion abnormalities. - Aortic valve: There was mild regurgitation. - Mitral valve: Calcified annulus. - Left atrium: The atrium was moderately dilated.  Admission HPI: is a 70 y.o. male with medical history significant of HTN, CKD 3-4, a-fib on Coumadin, CVA, DM2 presenting for Headache for 1 wk and vomiting for a few days. He has also noted weakness in both legs. In ER his BP is in 716 systolic. He states he has been taking his medications appropriately but was not able to take them this AM due to vomiting.no reflux and no diarrhea. No fevers. He was seen on 4/18 by Dr Eppie Gibson who notes non-compliance with meds and HTN likely secondary to Clonidine withdrawal/ rebound. CT head was negative. Headache has been on the top of his head and frontal area. No sinus issues. No visual changes or numbness or tingling.   Hospital Course by problem list:  Recurrent Cerebrovascular Accident: Initial workup for stroke, including MRI, TTE, and carotid dopplers, was reassuring and his lower extremity weakness was attributed to his hypertensive emergency. However, on 4/24, the patient was noted to have worsening RLE weakness. In the absence of an adequate explanation for this worsening weakness, we obtained a repeat MRI on 4/24 which demonstrated non hemorrhagic infarcts in the medial aspects of the posterior frontal and parietal lobes. His blood pressure medications except for prn hydralazine were discontinued to allow for permissive hypertension. He was deemed appropriate for CIR for rehab.   Hypertensive Emergency secondary to : He presented to the ED with SBPs >270. The patient said that he had not been taking any of his medication, including clonidine, over the past few days prior to admission since he was too busy tending to his wife, who is  in the hospital with a stroke.  BP was initially controlled with cardene gtt in the ICU but was transitioned to diltiazem and clonidine only on the floor. Blood pressure had been ~ 155-175/70-90 for the 24 hours prior to going to the floor. However, his blood pressure became increasingly erratic the following day with SBPs >200. His blood pressure regimen was adjusted to include hctz 12.5 mg daily, lisinopril 20 mg daily, and clonidine 0.3 mg TID, and prn hydralazine - but all but hydralazine were discontinued upon the discovery his CVA.   Chronic Atrial Fibrillation (CHADS-VASc 6): Patient was in RVR on admission which resolved upon initiating diltiazem. Patient had slightly subtherapeutic INR upon admission. INR was largely in 2-3  range for duration of admission. He was controlled on diltiazem 120 mg 24 hr tablet daily.  CKD Stage III: Patient had Creatinine elevated over baseline of ~2.3 to 2.97 on admission, likely attributable to hypertension. This resolved with better control of his blood pressure.   T2DM: Patients BGs were in the mid to high 200s on SSI-M, which improved to the 100s on SSI resistant.  Discharge Vitals:   BP 187/122 mmHg  Pulse 94  Temp(Src) 98.9 F (37.2 C) (Oral)  Resp 18  Ht '5\' 9"'  (1.753 m)  Wt 175 lb (79.379 kg)  BMI 25.83 kg/m2  SpO2 100%  Discharge Labs:  Results for orders placed or performed during the hospital encounter of 11/18/15 (from the past 24 hour(s))  CK     Status: None   Collection Time: 11/21/15  4:02 PM  Result Value Ref Range   Total CK 67 49 - 397 U/L  Glucose, capillary     Status: Abnormal   Collection Time: 11/21/15  6:07 PM  Result Value Ref Range   Glucose-Capillary 192 (H) 65 - 99 mg/dL  Glucose, capillary     Status: Abnormal   Collection Time: 11/21/15  9:55 PM  Result Value Ref Range   Glucose-Capillary 143 (H) 65 - 99 mg/dL   Comment 1 Notify RN    Comment 2 Document in Chart   Protime-INR     Status: Abnormal   Collection  Time: 11/22/15  5:40 AM  Result Value Ref Range   Prothrombin Time 30.1 (H) 11.6 - 15.2 seconds   INR 2.94 (H) 0.00 - 1.49  Magnesium     Status: None   Collection Time: 11/22/15  5:40 AM  Result Value Ref Range   Magnesium 1.7 1.7 - 2.4 mg/dL  Phosphorus     Status: None   Collection Time: 11/22/15  5:40 AM  Result Value Ref Range   Phosphorus 3.5 2.5 - 4.6 mg/dL  CBC     Status: Abnormal   Collection Time: 11/22/15  5:40 AM  Result Value Ref Range   WBC 6.1 4.0 - 10.5 K/uL   RBC 5.45 4.22 - 5.81 MIL/uL   Hemoglobin 13.9 13.0 - 17.0 g/dL   HCT 42.1 39.0 - 52.0 %   MCV 77.2 (L) 78.0 - 100.0 fL   MCH 25.5 (L) 26.0 - 34.0 pg   MCHC 33.0 30.0 - 36.0 g/dL   RDW 14.5 11.5 - 15.5 %   Platelets 196 150 - 400 K/uL  Basic metabolic panel     Status: Abnormal   Collection Time: 11/22/15  5:40 AM  Result Value Ref Range   Sodium 134 (L) 135 - 145 mmol/L   Potassium 3.5 3.5 - 5.1 mmol/L   Chloride 98 (L) 101 - 111 mmol/L   CO2 22 22 - 32 mmol/L   Glucose, Bld 179 (H) 65 - 99 mg/dL   BUN 28 (H) 6 - 20 mg/dL   Creatinine, Ser 2.37 (H) 0.61 - 1.24 mg/dL   Calcium 9.3 8.9 - 10.3 mg/dL   GFR calc non Af Amer 27 (L) >60 mL/min   GFR calc Af Amer 31 (L) >60 mL/min   Anion gap 14 5 - 15  Glucose, capillary     Status: Abnormal   Collection Time: 11/22/15  6:23 AM  Result Value Ref Range   Glucose-Capillary 177 (H) 65 - 99 mg/dL   Comment 1 Notify RN    Comment 2 Document in Chart   Glucose, capillary  Status: Abnormal   Collection Time: 11/22/15 11:50 AM  Result Value Ref Range   Glucose-Capillary 189 (H) 65 - 99 mg/dL    Signed: Liberty Handy, MD 11/22/2015, 3:42 PM    Services Ordered on Discharge: CIR

## 2015-11-22 NOTE — Progress Notes (Signed)
ANTICOAGULATION CONSULT NOTE - Follow Up Consult  Pharmacy Consult:  Coumadin Indication: atrial fibrillation  No Known Allergies  Patient Measurements: Height: 5' 9.5" (176.5 cm) Weight: 176 lb 5.9 oz (80 kg) IBW/kg (Calculated) : 71.85  Vital Signs: Temp: 98.6 F (37 C) (04/25 1705) Temp Source: Oral (04/25 1705) BP: 182/110 mmHg (04/25 1715) Pulse Rate: 77 (04/25 1705)  Labs:  Recent Labs  11/20/15 0424 11/20/15 1126 11/20/15 1955 11/21/15 0210 11/21/15 1602 11/22/15 0540  HGB  --   --   --   --   --  13.9  HCT  --   --   --   --   --  42.1  PLT  --   --   --   --   --  196  LABPROT 32.1*  --   --  32.3*  --  30.1*  INR 3.20*  --   --  3.22*  --  2.94*  CREATININE 2.49*  --   --  2.33*  --  2.37*  CKTOTAL  --   --   --   --  67  --   TROPONINI 0.09* 0.10* 0.09*  --   --   --     Estimated Creatinine Clearance: 30.8 mL/min (by C-G formula based on Cr of 2.37).    Assessment: 50 YOM presented from [facility] with HTN emergency.  Pharmacy consulted to continue Coumadin for history of Afib. Patient transferred to [facility].  -INR= 2.94 (trend down)   Goal of Therapy:  INR 2-3    Plan:  - Coumadin 3mg  PO today - Daily PT / INR  Hildred Laser, Pharm D 11/22/2015 5:19 PM

## 2015-11-22 NOTE — Progress Notes (Signed)
Rehab admissions - I met with patient, his wife, and 3 sons.  They are interested in inpatient rehab admission.  I gave them rehab booklets and I explained inpatient rehab.  Wife and sons will provide care after rehab stay.  Will await medical readiness prior to admission.  Call me for questions.  #[phone]

## 2015-11-22 NOTE — Discharge Instructions (Signed)
Patrick [name],  It was a pleasure taking care of you in the hospital.  Best of luck in rehabilitation. We hope this will help you get your strength back from your stroke.   Until tomorrow, you will only be on an as needed blood pressure medication (hydralazine 10 mg by mouth four times daily as needed for systolic blood pressure over [id number] or diastolic blood pressure over 120). Tomorrow morning, you will start on the following blood pressure medications:  Clonidine 0.3 mg three times a day Lisinopril 20 mg daily HCTZ 12.5 mg daily  We have discontinued your lasix.   Over time, it may be a good idea to to come off the clonidine SLOWLY and under the guidance of a physician. Until then, it is VERY IMPORTANT that you continue to take clonidine to prevent severe high blood pressure.  You can continue to resume your other home medications.

## 2015-11-22 NOTE — PMR Pre-admission (Signed)
PMR Admission Coordinator Pre-Admission Assessment  Patient: Patrick [name] is an 70 y.o., male MRN: [id number] DOB: Feb 06, 1946 Height: 5\' 9"  (175.3 cm) Weight: 79.379 kg (175 lb)              Insurance Information HMO: No   PPO:       PCP:       IPA:       80/20:       OTHER:   PRIMARY: Medicare A/B      Policy#: [id number]      Subscriber: Patrick [name] CM Name:        Phone#:       Fax#:   Pre-Cert#:        Employer: Not employed Benefits:  Phone #:      Name: Checked in [place]. Date: 07/30/05=A and 03/30/06=B     Deduct: $1316      Out of Pocket Max: none      Life Max: unlimited CIR: 100%      SNF: 100 days Outpatient: 80%     Co-Pay: 20% Home Health: 100%      Co-Pay: none DME: 80%     Co-Pay: 20% Providers: patient's choice  Medicaid Application Date:        Case Manager:   Disability Application Date:        Case Worker:    Emergency Facilities manager Information    Name Relation Home Work Mobile   [place] Spouse [phone]       Current Medical History  Patient Admitting Diagnosis:  B CVA  History of Present Illness: A 70 y.o. male with history of CKD, HTN, A fib, DMT2, prior CVAs with cognitive deficits who was admitted on 11/18/15 with hypertensive emergency, HA, weakness and minor right sided weakness. He was admitted due to elevated BP and concerns of PRES. Patient had missed multiple BP doses due to wife being in the hospital. Work up showed acute on chronic renal insufficiency as well as INR -1.97. He was started on cardene drip and home medications resumed. 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 MRI brain negative. Carotid dopplers without significant ICA stenosis. 2 D echo with EF 40-45% and no wall abnormality. Work up negative and neurology has signed off. He had decline in mobility with inability to stand, RLE stability due to worsening of RLE weakness yesterday. Repeat MRI brain done showing interval development of scattered  nonhemorrhagic infarcts medial aspect of posterior frontal and parietal lobes bilaterally, remote left thalamic infarcts, prior hemorrhagic infarct right cerebellar and posterior right temporal-occipital junction and global atrophy. Blood pressures remain labile requiring IV hydralazine and lisinopril increased for better control. Therapy ongoing and CIR recommended for follow up therapy. He was cleared medically for intensive rehab program.     Total: 3=NIH  Past Medical History  Past Medical History  Diagnosis Date  . Hypertension   . CKD (chronic kidney disease), stage II   . CAD (coronary artery disease)   . Depression   . Gout   . Hyperlipidemia   . 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 GERD (gastroesophageal reflux disease)   . Diabetes mellitus type II     Insulin dependent  . Upper GI bleeding 07/01/2013  . Paroxysmal atrial fibrillation (HCC)     on coumadin  . Atrial fibrillation ([place])   . Myocardial infarction [facility]) 2000's    "near heart attack" (07/14/2013)  . Shortness  of breath     "can happen at any time" (07/14/2013)  . Sleep apnea 07/2010    "has mask at home; seldom uses it" (07/14/2013)  . Stomach ulcer 1950's    "as a teenager"  . Stroke [facility]) ~ 2007; ~ 2009    "memory not as good since" (07/14/2013)  . Permanent atrial fibrillation ([place]) 07/17/2013  . Chronic anticoagulation, on coumadin 07/17/2013  . Tachy-brady syndrome ([place]) 07/17/2013    Family History  family history includes Cancer in his brother; Diabetes in his mother and sister; Heart Problems in his father; Hypertension in his mother and sister.  Prior Rehab/Hospitalizations: No previous rehab  Has the patient had major surgery during 100 days prior to admission? No  Current Medications   Current facility-administered medications:  .  acetaminophen (TYLENOL) tablet 1,000 mg, 1,000 mg, Oral, Q6H PRN, Liberty Handy, MD, 1,000 mg at 11/22/15  1244 .  allopurinol (ZYLOPRIM) tablet 200 mg, 200 mg, Oral, Daily, Debbe Odea, MD, 200 mg at 11/22/15 0911 .  aspirin EC tablet 81 mg, 81 mg, Oral, Daily, Debbe Odea, MD, 81 mg at 11/22/15 0911 .  diclofenac sodium (VOLTAREN) 1 % transdermal gel 4 g, 4 g, Topical, QID, Liberty Handy, MD, 4 g at 11/22/15 0914 .  diltiazem (CARDIZEM CD) 24 hr capsule 120 mg, 120 mg, Oral, Daily, Debbe Odea, MD, 120 mg at 11/22/15 0911 .  hydrALAZINE (APRESOLINE) injection 10 mg, 10 mg, Intravenous, Q6H PRN, Rushil Patel V, MD .  insulin aspart (novoLOG) injection 0-20 Units, 0-20 Units, Subcutaneous, TID WC, Liberty Handy, MD, 4 Units at 11/22/15 1244 .  insulin aspart (novoLOG) injection 0-5 Units, 0-5 Units, Subcutaneous, QHS, Liberty Handy, MD, 0 Units at 11/21/15 2200 .  polyethylene glycol (MIRALAX / GLYCOLAX) packet 17 g, 17 g, Oral, Daily, Liberty Handy, MD, 17 g at 11/22/15 0911 .  senna-docusate (Senokot-S) tablet 1 tablet, 1 tablet, Oral, QHS PRN, Liberty Handy, MD .  warfarin (COUMADIN) tablet 3 mg, 3 mg, Oral, ONCE-1800, Tyrone Apple, RPH .  Warfarin - Pharmacist Dosing Inpatient, , Does not apply, q1800, Randa Spike, RPH  Patients Current Diet: Diet heart healthy/carb modified Room service appropriate?: Yes; Fluid consistency:: Thin Diet - low sodium heart healthy  Precautions / Restrictions Precautions Precautions: Fall Restrictions Weight Bearing Restrictions: No   Has the patient had 2 or more falls or a fall with injury in the past year?No.  Wife reports that patient has been unsteady most recently.  Prior Activity Level Limited Community (1-2x/wk): Went out 2-3 X a week.  Home Assistive Devices / Equipment Home Assistive Devices/Equipment: None Home Equipment: Bedside commode (wife has BSC)  Prior Device Use: Indicate devices/aids used by the patient prior to current illness, exacerbation or injury? None  Prior Functional Level Prior Function Level of Independence:  Independent  Self Care: Did the patient need help bathing, dressing, using the toilet or eating?  Independent  Indoor Mobility: Did the patient need assistance with walking from room to room (with or without device)? Independent  Stairs: Did the patient need assistance with internal or external stairs (with or without device)? Independent  Functional Cognition: Did the patient need help planning regular tasks such as shopping or remembering to take medications? Independent  Current Functional Level Cognition  Overall Cognitive Status: Impaired/Different from baseline Current Attention Level: Sustained Orientation Level: Oriented X4 Following Commands: Follows one step commands with increased time    Extremity Assessment (includes Sensation/Coordination)  Upper Extremity Assessment: Overall [facility]  for tasks assessed  Lower Extremity Assessment: Defer to PT evaluation RLE Deficits / Details: AROM WFL, strength hip flexion 2+/5, knee extension 2/5, ankle DF 3/5 LLE Deficits / Details: AROM WFL, strength hip flexion 2+/5, knee extension 3/5, ankle DF 3/5    ADLs  Overall ADL's : Needs assistance/impaired Eating/Feeding: Set up, Sitting Grooming: Min guard, Sitting, Set up Upper Body Bathing: Minimal assitance, Sitting Lower Body Bathing: Maximal assistance, Sit to/from stand, +2 for physical assistance Upper Body Dressing : Minimal assistance, Sitting Lower Body Dressing: Maximal assistance, +2 for safety/equipment, Sit to/from stand Lower Body Dressing Details (indicate cue type and reason): Pt unable to pull up socks in sitting secondary to increased pain in R thigh. Toilet Transfer: Maximal assistance, Stand-pivot, BSC (pt holding on to therapist bil forearms) Toilet Transfer Details (indicate cue type and reason): simulated by transfer from EOB to chair Toileting- Clothing Manipulation and Hygiene: Maximal assistance, +2 for physical assistance, Sit to/from stand Functional mobility  during ADLs: Maximal assistance (for stand pivot transfer) General ADL Comments: Pt with significant weakness in R LE; significant difficulty moving RLE in standing for stand pivot.    Mobility  Overal bed mobility: Needs Assistance Bed Mobility: Supine to Sit Supine to sit: Max assist, Mod assist, HOB elevated General bed mobility comments: Assist with RLE and to elevate trunk to get to EOB. Use of bed rail for support. Increased time.    Transfers  Overall transfer level: Needs assistance Equipment used: Rolling walker (2 wheeled) Transfers: Sit to/from Stand Sit to Stand: Mod assist, +2 physical assistance, From elevated surface Stand pivot transfers: Max assist General transfer comment: Assist of 2 to stand from EOB with cues for hand placement, technique with therapist stabilizing RLE. Right knee instability in standing. SPT bed to chair with max A of 2. Difficulty advancing RLE and mobilizing LLE due to right knee buckling.    Ambulation / Gait / Stairs / Wheelchair Mobility  Ambulation/Gait Ambulation/Gait assistance:  (Deferred secondary to elevated BP - 180/129 manually.) Ambulation Distance (Feet): 10 Feet Assistive device: Rolling walker (2 wheeled) Gait Pattern/deviations: Step-to pattern, Decreased stride length, Shuffle, Trunk flexed, Decreased dorsiflexion - left, Decreased dorsiflexion - right General Gait Details: initially needed facilitation for weight shift to allow LE progression, at times needing assist to progress R foot due to dragging on floor, then able to sequence and weight shift with less assist once started walking    Posture / Balance Dynamic Sitting Balance Sitting balance - Comments: Mod A initially with moments of Min guard with LOB to the right. Able to self correct with cues inconsistently.  Balance Overall balance assessment: Needs assistance Sitting-balance support: Feet supported, No upper extremity supported Sitting balance-Leahy Scale:  Poor Sitting balance - Comments: Mod A initially with moments of Min guard with LOB to the right. Able to self correct with cues inconsistently.  Standing balance support: Bilateral upper extremity supported Standing balance-Leahy Scale: Poor Standing balance comment: Max A of 2 for standing balance with therapist blocking right knee for support.     Special needs/care consideration BiPAP/CPAP Yes, but he does not use it consistently. CPM No Continuous Drip IV No Dialysis No        Life Vest No Oxygen No Special Bed No Trach Size No Wound Vac (area) No      Skin Left leg bruising  Bowel mgmt: Last BM 11/18/2015 Bladder mgmt: Using urinal WDL Diabetic mgmt Yes, on oral medications    Previous Home Environment Living Arrangements: Spouse/significant other Available Help at Discharge: Family Type of Home: Apartment Home Layout: One level Home Access: Level entry Bathroom Shower/Tub: Chiropodist: Standard Home Care Services: No Additional Comments: patient's wife just here last week due to stroke  Discharge Living Setting Plans for Discharge Living Setting: Patient's home, Lives with (comment), Apartment (Lives with wife.) Type of Home at Discharge: Apartment Discharge Home Layout: One level Discharge Home Access: Level entry Does the patient have any problems obtaining your medications?: Yes (Describe) (financial, not using catapres patch because of cost)  Social/Family/Support Systems Patient Roles: Spouse, Parent (Has a wife and 4 sons.) Contact Information: Joji Kirchgessner - spouse Anticipated Caregiver: Wife Anticipated Caregiver's Contact Information: [name] - wife (h) [phone]  Ability/Limitations of Caregiver: Wife can assist.  Has four sons who can also assist. Caregiver Availability: 24/7 Discharge Plan Discussed with Primary Caregiver: Yes Is Caregiver In Agreement with Plan?: Yes Does Caregiver/Family have  Issues with Lodging/Transportation while Pt is in Rehab?: No  Goals/Additional Needs Patient/Family Goal for Rehab: PT/OT supervision to min assist, ST supervision goals Expected length of stay: 14-18 days Cultural Considerations: Christian Dietary Needs: Heart healthy, carb mod, thin liquids Equipment Needs: TBD Pt/Family Agrees to Admission and willing to participate: Yes Program Orientation Provided & Reviewed with Pt/Caregiver Including Roles  & Responsibilities: Yes  Decrease burden of Care through IP rehab admission: N/A  Possible need for SNF placement upon discharge: Not anticipated  Patient Condition: This patient's condition remains as documented in the consult dated 11/21/15 and addended 11/22/15, in which the Rehabilitation Physician determined and documented that the patient's condition is appropriate for intensive rehabilitative care in an inpatient rehabilitation facility pending stable BP and HR. These areas have been addressed. Neurology wants permissive BP for the next 24 hours with prn intervention.  Neurology and attending MD have cleared patient for acute inpatient rehab today.  Will admit to inpatient rehab today.  Preadmission Screen Completed By:  Retta Diones, 11/22/2015 2:34 PM ______________________________________________________________________   Discussed status with Dr. Naaman Plummer on 04/25/17at 1433 and received telephone approval for admission today.  Admission Coordinator:  Retta Diones, time1433/Date04/25/17

## 2015-11-22 NOTE — Progress Notes (Signed)
Patient seen and examined. Case d/w residents in detail. I agree with findings and plan as documented in Dr. Barbera Setters note.  Patient noted to have new small non hemorrhagic infarcts in the medial aspects of the posterior frontal and parietal lobes. This would likely explain his R sided weakness particularly in his R LE. Neuro to follow up today. Will allow permissive HTN over the next 48 hours with hydralazine prn. Will c/w asa. Patient is now awaiting admission to CIR once medically cleared.  Will c/w diltiazem and coumadin for his chronic afib

## 2015-11-22 NOTE — Progress Notes (Signed)
Patrick Lorie Phenix, MD Physician Addendum Physical Medicine and Rehabilitation Consult Note 11/21/2015 8:49 AM  Related encounter: ED to Hosp-Admission (Discharged) from 11/18/2015 in [place] Collapse All        Physical Medicine and Rehabilitation Consult  Reason for Consult: Weakness Referring Physician: Dr. Chase Caller.    HPI: Patrick [name] is a 70 y.o. male with history of CKD, HTN, A fib, DMT2 who was admitted on 11/18/15 with hypertensive emergency, HA, weakness and minor right sided weakness. He was admitted due to concerns of PRES. Patient had missed multiple BP doses due to wife being in the hospital. Work up showed acute on chronic renal insufficiency and INR -1.97 at admission. He was started on cardene drip and home medications resumed. 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 MRI brain negative. Carotid dopplers without significant ICA stenosis. 2 D echo with EF 40-45% and no wall abnormality. Work up negative and neurology has signed off. Therapy evaluations done and CIR recommended due to generalized weakness.    Review of Systems  HENT: Negative for hearing loss.  Eyes: Negative for blurred vision and double vision.  Respiratory: Negative for cough and shortness of breath.  Cardiovascular: Negative for chest pain and palpitations.  Gastrointestinal: Negative for heartburn, nausea and abdominal pain.  Genitourinary: Negative for dysuria and urgency.  Musculoskeletal: Positive for joint pain (right > left leg pain--at hips?). Negative for myalgias and falls.  Neurological: Positive for focal weakness and weakness. Negative for speech change and headaches.  Psychiatric/Behavioral: Positive for memory loss.  All other systems reviewed and are negative.   Past Medical History  Diagnosis Date  . Hypertension   . CKD (chronic kidney disease), stage II   . CAD (coronary artery disease)   . Depression    . Gout   . Hyperlipidemia   . 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 GERD (gastroesophageal reflux disease)   . Diabetes mellitus type II     Insulin dependent  . Upper GI bleeding 07/01/2013  . Paroxysmal atrial fibrillation (HCC)     on coumadin  . Atrial fibrillation ([place])   . Myocardial infarction [facility]) 2000's    "near heart attack" (07/14/2013)  . Shortness of breath     "can happen at any time" (07/14/2013)  . Sleep apnea 07/2010    "has mask at home; seldom uses it" (07/14/2013)  . Stomach ulcer 1950's    "as a teenager"  . Stroke [facility]) ~ 2007; ~ 2009    "memory not as good since" (07/14/2013)  . Permanent atrial fibrillation ([place]) 07/17/2013  . Chronic anticoagulation, on coumadin 07/17/2013  . Tachy-brady syndrome ([place]) 07/17/2013    Past Surgical History  Procedure Laterality Date  . Cardiac catheterization  07/2003    /encounter notes 08/27/2005 (07/01/2013)    Family History  Problem Relation Age of Onset  . Diabetes Mother   . Hypertension Mother   . Heart Problems Father   . Cancer Brother   . Hypertension Sister   . Diabetes Sister       Social History: Married. Independent PTA. He reports that he quit smoking about 39 years ago. His smoking use included Cigarettes. He has a 7.5 pack-year smoking history. He has quit using smokeless tobacco. He reports that he does not drink alcohol--.quit 39 years ago. He reports that he does not use illicit drugs.  Allergies: No Known Allergies    Medications Prior to Admission  Medication Sig Dispense Refill  . allopurinol (ZYLOPRIM) 100 MG tablet TAKE TWO TABLETS BY MOUTH ONCE DAILY 60 tablet 0  . aspirin 81 MG EC tablet Take 81 mg by mouth daily.     . cloNIDine (CATAPRES) 0.3 MG tablet Take 1 tablet (0.3 mg total) by  mouth 3 (three) times daily. 90 tablet 5  . diltiazem (CARDIZEM) 30 MG tablet Take 30 mg by mouth 3 (three) times daily.    . furosemide (LASIX) 20 MG tablet Take 1 tablet (20 mg total) by mouth 2 (two) times daily. (Patient taking differently: Take 20 mg by mouth daily. ) 60 tablet 5  . lisinopril (PRINIVIL,ZESTRIL) 10 MG tablet Take 1 tablet (10 mg total) by mouth daily. 90 tablet 0  . metFORMIN (GLUCOPHAGE-XR) 500 MG 24 hr tablet TAKE 2 TABLETS BY MOUTH WITH BREAKFAST 60 tablet 0  . pravastatin (PRAVACHOL) 80 MG tablet Take 1 tablet (80 mg total) by mouth daily. 90 tablet 3  . warfarin (COUMADIN) 5 MG tablet Take 1 tablet (5 mg total) by mouth daily at 6 PM. 30 tablet 1  . Blood Glucose Monitoring Suppl (ACCU-CHEK AVIVA PLUS) W/DEVICE KIT 1 each by Does not apply route as needed. 1 kit 0  . diltiazem (DILACOR XR) 120 MG 24 hr capsule Take 1 capsule (120 mg total) by mouth daily. 30 capsule 1  . glucose blood (TRUETRACK TEST) test strip Use to test blood sugar 2-3 times daily dx code 250.00 100 each 11  . Lancets 30G MISC Use to test blood glucose 2-3 times daily dx code 250.00 100 each 11    Home: Home Living Family/patient expects to be discharged to:: Unsure Living Arrangements: Spouse/significant other Available Help at Discharge: Family Type of Home: Apartment Home Access: Level entry Home Layout: One level Bathroom Shower/Tub: Chiropodist: Standard Home Equipment: Bedside commode (wife has BSC) Additional Comments: patient's wife just here last week due to stroke  Functional History: Prior Function Level of Independence: Independent Functional Status:  Mobility: Bed Mobility Overal bed mobility: Needs Assistance Bed Mobility: Supine to Sit Supine to sit: Min assist, HOB elevated General bed mobility comments: Min assist for bil LEs. Increased time required. HOB elevated with use of bed  rail. Transfers Overall transfer level: Needs assistance Equipment used: 1 person hand held assist Transfers: Sit to/from Stand, Stand Pivot Transfers Sit to Stand: Mod assist Stand pivot transfers: Max assist General transfer comment: Sit to stand from EOB x 2. Stand pivot from EOB to chair x 1. VCs for hand placement and technique.  Ambulation/Gait Ambulation/Gait assistance: Mod assist, Max assist, +2 safety/equipment Ambulation Distance (Feet): 10 Feet Assistive device: Rolling walker (2 wheeled) Gait Pattern/deviations: Step-to pattern, Decreased stride length, Shuffle, Trunk flexed, Decreased dorsiflexion - left, Decreased dorsiflexion - right General Gait Details: initially needed facilitation for weight shift to allow LE progression, at times needing assist to progress R foot due to dragging on floor, then able to sequence and weight shift with less assist once started walking    ADL: ADL Overall ADL's : Needs assistance/impaired Eating/Feeding: Set up, Sitting Grooming: Min guard, Sitting, Set up Upper Body Bathing: Minimal assitance, Sitting Lower Body Bathing: Maximal assistance, Sit to/from stand, +2 for physical assistance Upper Body Dressing : Minimal assistance, Sitting Lower Body Dressing: Maximal assistance, +2 for safety/equipment, Sit to/from stand Lower Body Dressing Details (indicate cue type and reason): Pt unable to pull up  socks in sitting secondary to increased pain in R thigh. Toilet Transfer: Maximal assistance, Stand-pivot, BSC (pt holding on to therapist bil forearms) Toilet Transfer Details (indicate cue type and reason): simulated by transfer from EOB to chair Toileting- Clothing Manipulation and Hygiene: Maximal assistance, +2 for physical assistance, Sit to/from stand Functional mobility during ADLs: Maximal assistance (for stand pivot transfer) General ADL Comments: Pt with significant weakness in R LE; significant difficulty moving RLE in standing for  stand pivot.  Cognition: Cognition Overall Cognitive Status: Impaired/Different from baseline Orientation Level: Oriented X4 Cognition Arousal/Alertness: Awake/alert Behavior During Therapy: Flat affect Overall Cognitive Status: Impaired/Different from baseline Area of Impairment: Following commands, Problem solving, Attention Current Attention Level: Sustained Following Commands: Follows one step commands with increased time Problem Solving: Slow processing  Blood pressure 180/120, pulse 54, temperature 98.3 F (36.8 C), temperature source Oral, resp. rate 20, height '5\' 9"'  (1.753 m), weight 79.833 kg (176 lb), SpO2 98 %. Physical Exam  Vitals reviewed. Constitutional: He appears well-developed and well-nourished.  HENT:  Head: Normocephalic and atraumatic.  Eyes: EOM are normal. Right eye exhibits no discharge. Left eye exhibits no discharge.  Neck: Normal range of motion. Neck supple.  Cardiovascular:  Irregularly irregular  Respiratory: Effort normal and breath sounds normal.  GI: Soft. Bowel sounds are normal.  Musculoskeletal: He exhibits no edema or tenderness.  Neurological:  Fatigued Alert and oriented 2 with cues DTRs symmetric Motor: Right upper extremity: 4+/5 proximal to distal Left upper extremity: 4+/5 proximal to distal Right lower extremity: Hip flexion 2/5, knee extension 3/5, ankle dorsi/plantar flexion 2/5 Left lower extremity: 4+/5 proximal to distal  Skin: Skin is warm and dry.  Psychiatric: His affect is blunt. His speech is delayed. He is slowed. Cognition and memory are impaired.     Lab Results Last 24 Hours    Results for orders placed or performed during the hospital encounter of 11/18/15 (from the past 24 hour(s))  Troponin I Status: Abnormal   Collection Time: 11/20/15 11:26 AM  Result Value Ref Range   Troponin I 0.10 (H) <0.031 ng/mL  Glucose, capillary Status: Abnormal   Collection Time: 11/20/15 12:06 PM   Result Value Ref Range   Glucose-Capillary 181 (H) 65 - 99 mg/dL  Glucose, capillary Status: Abnormal   Collection Time: 11/20/15 4:28 PM  Result Value Ref Range   Glucose-Capillary 191 (H) 65 - 99 mg/dL  Troponin I Status: Abnormal   Collection Time: 11/20/15 7:55 PM  Result Value Ref Range   Troponin I 0.09 (H) <0.031 ng/mL  Glucose, capillary Status: Abnormal   Collection Time: 11/20/15 9:21 PM  Result Value Ref Range   Glucose-Capillary 111 (H) 65 - 99 mg/dL   Comment 1 Notify RN    Comment 2 Document in Chart   Glucose, capillary Status: Abnormal   Collection Time: 11/20/15 10:17 PM  Result Value Ref Range   Glucose-Capillary 167 (H) 65 - 99 mg/dL   Comment 1 Notify RN    Comment 2 Document in Chart   Basic metabolic panel Status: Abnormal   Collection Time: 11/21/15 2:10 AM  Result Value Ref Range   Sodium 137 135 - 145 mmol/L   Potassium 3.8 3.5 - 5.1 mmol/L   Chloride 101 101 - 111 mmol/L   CO2 24 22 - 32 mmol/L   Glucose, Bld 199 (H) 65 - 99 mg/dL   BUN 27 (H) 6 - 20 mg/dL   Creatinine, Ser 2.33 (H) 0.61 - 1.24 mg/dL  Calcium 9.0 8.9 - 10.3 mg/dL   GFR calc non Af Amer 27 (L) >60 mL/min   GFR calc Af Amer 32 (L) >60 mL/min   Anion gap 12 5 - 15  Protime-INR Status: Abnormal   Collection Time: 11/21/15 2:10 AM  Result Value Ref Range   Prothrombin Time 32.3 (H) 11.6 - 15.2 seconds   INR 3.22 (H) 0.00 - 1.49  Magnesium Status: None   Collection Time: 11/21/15 2:10 AM  Result Value Ref Range   Magnesium 1.7 1.7 - 2.4 mg/dL  Phosphorus Status: None   Collection Time: 11/21/15 2:10 AM  Result Value Ref Range   Phosphorus 2.9 2.5 - 4.6 mg/dL  Glucose, capillary Status: Abnormal   Collection Time: 11/21/15 6:41 AM  Result Value Ref Range   Glucose-Capillary 188  (H) 65 - 99 mg/dL   Comment 1 Notify RN    Comment 2 Document in Chart       Imaging Results (Last 48 hours)    No results found.    Assessment/Plan: Diagnosis: Weakness Labs and images independently reviewed. Records reviewed and summated above.  1. Does the need for close, 24 hr/day medical supervision in concert with the patient's rehab needs make it unreasonable for this patient to be served in a less intensive setting? Potentially  2. Co-Morbidities requiring supervision/potential complications: acute on chronic renal insufficiency (avoid nephrotoxic meds), uncontrolled HTN ( optimize meds, monitor and provide prns in accordance with increased physical exertion and pain), A fib (monitor heart rate with increased physical activity), continue meds, DMT2 (Monitor in accordance with exercise and adjust meds as necessary), HA (ensure pain does not limit progress in therapies), chorinic left thalamus infarct, supra therapeutic INR (optimize Coumadin), diastolic CHF (monitor weight) 3. Due to safety, disease management, medication administration and patient education, does the patient require 24 hr/day rehab nursing? Yes 4. Does the patient require coordinated care of a physician, rehab nurse, PT (1-2 hrs/day, 5 days/week) and OT (1-2 hrs/day, 5 days/week) to address physical and functional deficits in the context of the above medical diagnosis(es)? Yes Addressing deficits in the following areas: balance, endurance, locomotion, strength, transferring, bathing, dressing, toileting and psychosocial support 5. Can the patient actively participate in an intensive therapy program of at least 3 hrs of therapy per day at least 5 days per week? Potentially 6. The potential for patient to make measurable gains while on inpatient rehab is excellent 7. Anticipated functional outcomes upon discharge from inpatient rehab are supervision and min assist with PT, supervision and min assist with OT,  supervision with SLP. 8. Estimated rehab length of stay to reach the above functional goals is: 14-18 days. 9. Does the patient have adequate social supports and living environment to accommodate these discharge functional goals? Potentially 10. Anticipated D/C setting: Home 11. Anticipated post D/C treatments: HH therapy and Home excercise program 12. Overall Rehab/Functional Prognosis: good  RECOMMENDATIONS: This patient's condition is appropriate for continued rehabilitative care in the following setting: Currently, the patient does not have a diagnosis that is amenable to IRF. In addition. Patient is not able to medically tolerate 3 hours of therapy per day at present. Patient has agreed to participate in recommended program. Potentially Note that insurance prior authorization may be required for reimbursement for recommended care.  Addendum: Repeat MRI showing bilateral scattered infarcts. Will consider admission to CIR once medically appropriate (particulary BP and HR). Would also consider further workup for RLE weakness.  Comment: Delice Lesch, MD  Delice Lesch, MD  11/21/2015          Revision History     Date/Time User Provider Type Action   11/22/2015 10:54 AM Patrick Lorie Phenix, MD Physician Addend   11/22/2015 10:52 AM Patrick Lorie Phenix, MD Physician Addend   11/21/2015 1:59 PM Patrick Lorie Phenix, MD Physician Sign   11/21/2015 11:21 AM Bary Leriche, PA-C Physician Assistant [place]   View Details Report       Routing History     Date/Time From To Method   11/22/2015 10:54 AM Patrick Lorie Phenix, MD Milagros Loll, MD In Basket

## 2015-11-22 NOTE — Care Management Important Message (Signed)
Important Message  Patient Details  Name: Torrean Lambrecht MRN: [id number] Date of Birth: 02/02/1946   Medicare Important Message Given:  Yes    Barb Merino [place] 11/22/2015, 1:33 PM

## 2015-11-22 NOTE — Progress Notes (Signed)
ANTICOAGULATION CONSULT NOTE - Follow Up Consult  Pharmacy Consult:  Coumadin Indication: atrial fibrillation  No Known Allergies  Patient Measurements: Height: 5\' 9"  (175.3 cm) Weight: 175 lb (79.379 kg) IBW/kg (Calculated) : 70.7  Vital Signs: Temp: 98.7 F (37.1 C) (04/25 0937) Temp Source: Oral (04/25 0937) BP: 175/96 mmHg (04/25 0937) Pulse Rate: 85 (04/25 0937)  Labs:  Recent Labs  11/20/15 0424 11/20/15 1126 11/20/15 1955 11/21/15 0210 11/21/15 1602 11/22/15 0540  HGB  --   --   --   --   --  13.9  HCT  --   --   --   --   --  42.1  PLT  --   --   --   --   --  196  LABPROT 32.1*  --   --  32.3*  --  30.1*  INR 3.20*  --   --  3.22*  --  2.94*  CREATININE 2.49*  --   --  2.33*  --  2.37*  CKTOTAL  --   --   --   --  67  --   TROPONINI 0.09* 0.10* 0.09*  --   --   --     Estimated Creatinine Clearance: 30.2 mL/min (by C-G formula based on Cr of 2.37).    Assessment: 69 YOM presented from [facility] with HTN emergency.  Pharmacy consulted to continue Coumadin for history of Afib.  INR decreased to therapeutic level today.  No bleeding reported.   Goal of Therapy:  INR 2-3    Plan:  - Coumadin 3mg  PO today - Daily PT / INR   Misako Roeder D. Mina Marble, PharmD, BCPS Pager:  [phone] 11/22/2015, 11:06 AM

## 2015-11-23 LAB — CBC WITH DIFFERENTIAL/PLATELET
BASOS ABS: 0 10*3/uL (ref 0.0–0.1)
Basophils Relative: 0 %
EOS PCT: 1 %
Eosinophils Absolute: 0.1 10*3/uL (ref 0.0–0.7)
HEMATOCRIT: 43.1 % (ref 39.0–52.0)
HEMOGLOBIN: 14 g/dL (ref 13.0–17.0)
LYMPHS ABS: 1.1 10*3/uL (ref 0.7–4.0)
LYMPHS PCT: 15 %
MCH: 25.2 pg — AB (ref 26.0–34.0)
MCHC: 32.5 g/dL (ref 30.0–36.0)
MCV: 77.5 fL — AB (ref 78.0–100.0)
Monocytes Absolute: 0.9 10*3/uL (ref 0.1–1.0)
Monocytes Relative: 12 %
NEUTROS ABS: 5.2 10*3/uL (ref 1.7–7.7)
Neutrophils Relative %: 73 %
PLATELETS: 170 10*3/uL (ref 150–400)
RBC: 5.56 MIL/uL (ref 4.22–5.81)
RDW: 14.5 % (ref 11.5–15.5)
WBC: 7.2 10*3/uL (ref 4.0–10.5)

## 2015-11-23 LAB — PROTIME-INR
INR: 3.42 — AB (ref 0.00–1.49)
PROTHROMBIN TIME: 33.8 s — AB (ref 11.6–15.2)

## 2015-11-23 LAB — COMPREHENSIVE METABOLIC PANEL
ALK PHOS: 70 U/L (ref 38–126)
ALT: 25 U/L (ref 17–63)
AST: 26 U/L (ref 15–41)
Albumin: 3.2 g/dL — ABNORMAL LOW (ref 3.5–5.0)
Anion gap: 12 (ref 5–15)
BILIRUBIN TOTAL: 0.9 mg/dL (ref 0.3–1.2)
BUN: 42 mg/dL — AB (ref 6–20)
CALCIUM: 9.1 mg/dL (ref 8.9–10.3)
CO2: 23 mmol/L (ref 22–32)
CREATININE: 3.37 mg/dL — AB (ref 0.61–1.24)
Chloride: 99 mmol/L — ABNORMAL LOW (ref 101–111)
GFR, EST AFRICAN AMERICAN: 20 mL/min — AB (ref >60)
GFR, EST NON AFRICAN AMERICAN: 17 mL/min — AB (ref >60)
Glucose, Bld: 131 mg/dL — ABNORMAL HIGH (ref 65–99)
Potassium: 3.5 mmol/L (ref 3.5–5.1)
Sodium: 134 mmol/L — ABNORMAL LOW (ref 135–145)
TOTAL PROTEIN: 6.2 g/dL — AB (ref 6.5–8.1)

## 2015-11-23 LAB — GLUCOSE, CAPILLARY
GLUCOSE-CAPILLARY: 121 mg/dL — AB (ref 65–99)
Glucose-Capillary: 125 mg/dL — ABNORMAL HIGH (ref 65–99)
Glucose-Capillary: 209 mg/dL — ABNORMAL HIGH (ref 65–99)
Glucose-Capillary: 222 mg/dL — ABNORMAL HIGH (ref 65–99)

## 2015-11-23 MED ORDER — SODIUM CHLORIDE 0.45 % IV SOLN
INTRAVENOUS | Status: DC
  Administered 2015-11-23 – 2015-11-28 (×6): via INTRAVENOUS

## 2015-11-23 MED ORDER — HYDRALAZINE HCL 10 MG PO TABS
10.0000 mg | ORAL_TABLET | Freq: Four times a day (QID) | ORAL | Status: DC | PRN
  Administered 2015-11-25 (×2): 10 mg via ORAL
  Filled 2015-11-23 (×3): qty 1

## 2015-11-23 MED ORDER — CLONIDINE HCL 0.3 MG PO TABS
0.3000 mg | ORAL_TABLET | Freq: Three times a day (TID) | ORAL | Status: DC
  Administered 2015-11-23 – 2015-12-09 (×50): 0.3 mg via ORAL
  Filled 2015-11-23 (×50): qty 1

## 2015-11-23 NOTE — Evaluation (Signed)
Speech Language Pathology Assessment and Plan  Patient Details  Name: Patrick [name] MRN: [id number] Date of Birth: 1946-05-31  SLP Diagnosis: Cognitive Impairments  Rehab Potential: Good ELOS: 17-19  days     Today's Date: 11/23/2015 SLP Individual Time: 1431-1530 SLP Individual Time Calculation (min): 59 min   Problem List:  Patient Active Problem List   Diagnosis Date Noted  . Stroke due to embolism ([place]) 11/22/2015  . TIA (transient ischemic attack)   . AKI (acute kidney injury) ([place])   . Other vascular headache   . History of CVA with residual deficit   . Supratherapeutic INR   . Hypertensive emergency 11/18/2015  . Diastolic CHF, chronic ([place]) 11/18/2015  . Hypertensive encephalopathy 11/18/2015  . Hypertensive crisis 11/18/2015  . Headache 11/15/2015  . Thigh pain 11/15/2015  . Hematuria 08/30/2015  . Chronic anemia 03/31/2014  . Healthcare maintenance 03/31/2014  . Permanent atrial fibrillation ([place]) 07/17/2013  . Chronic anticoagulation, on coumadin 07/17/2013  . Tachy-brady syndrome ([place]) 07/17/2013  . Non compliance w medication regimen 06/25/2012  . Acute ischemic VBA thalamic stroke ([place]) 12/11/2010  . Chronic kidney disease (CKD), stage III (moderate) 12/21/2008  . CORONARY ARTERY DISEASE, non obstructive disease in 2005 with cath 07/19/2006  . Diabetes mellitus with severe nonproliferative retinopathy and macular edema 06/05/2006  . Hyperlipidemia 06/05/2006  . Gout 06/05/2006  . DEPRESSION 06/05/2006  . Uncontrolled hypertension 06/05/2006  . Sleep apnea 06/05/2006   Past Medical History:  Past Medical History  Diagnosis Date  . Hypertension   . CKD (chronic kidney disease), stage II   . CAD (coronary artery disease)   . Depression   . Gout   . Hyperlipidemia   . 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 GERD (gastroesophageal reflux disease)   . Diabetes mellitus type II     Insulin dependent  .  Upper GI bleeding 07/01/2013  . Paroxysmal atrial fibrillation (HCC)     on coumadin  . Atrial fibrillation ([place])   . Myocardial infarction [facility]) 2000's    "near heart attack" (07/14/2013)  . Shortness of breath     "can happen at any time" (07/14/2013)  . Sleep apnea 07/2010    "has mask at home; seldom uses it" (07/14/2013)  . Stomach ulcer 1950's    "as a teenager"  . Stroke [facility]) ~ 2007; ~ 2009    "memory not as good since" (07/14/2013)  . Permanent atrial fibrillation ([place]) 07/17/2013  . Chronic anticoagulation, on coumadin 07/17/2013  . Tachy-brady syndrome ([place]) 07/17/2013   Past Surgical History:  Past Surgical History  Procedure Laterality Date  . Cardiac catheterization  07/2003    /encounter notes 08/27/2005 (07/01/2013)    Assessment / Plan / Recommendation Clinical Impression   Patrick [name] is a 70 y.o. male with history of CKD, HTN, A fib, DMT2, prior CVAs with cognitive deficits who was admitted on 11/18/15 with hypertensive emergency, HA, weakness and minor right sided weakness.  Patient had missed multiple BP doses due to wife being in the hospital. CT head with chronic left thalamus infarct. MRI brain negative. He had decline in mobility with inability to stand, RLE stability due to worsening of RLE weakness. Repeat MRI brain done showing interval development of scattered nonhemorrhagic infarcts medial aspect of posterior frontal and parietal lobes bilaterally, remote left thalamic infarcts, prior hemorrhagic infarct right cerebellar and posterior right temporal-occipital junction and global atrophy. Pt admitted to CIR on 11/22/15.  SLP evaluation completed 11/23/2015 with the following results:  Evaluation limited due to pt fatigue and headache.  Pt premedicated prior to SLP's arrival but still with complaints of 7 out of 10 headache.  Pt able to follow 1 and 2 step commands without difficulty and could answer simple yes/no questions.  Pt's speech was free from dysarthria or  word finding impairment during limited interactions with therapist.  No focal areas of oral motor weakness evident on exam.  Pt presented with moderate cognitive deficits characterized by decreased sustained attention to tasks which impacted all higher level cognitive processes.  Pt would benefit from skilled ST while inpatient in order to maximize functional independence and reduce burden of care prior to discharge.  Skilled Therapeutic Interventions          Cognitive-linguistic evaluation completed with results and recommendations reviewed with patient and family.  Pt's wife was present for the duration of today's therapy session who endorsed her own difficulties with memory for daily information and is currently receiving ST services for recent CVA.  Pt's wife reported that pt was having difficulty managing his own medications prior to admission.  SLP discussed the importance of taking medications exactly as their prescribed in the prevention of recurrence of stroke.  SLP recommended that pt have assistance for medication management at discharge.  Pt's wife verbalized understanding of recommendations.      SLP Assessment  Patient will need skilled [facility] during CIR admission    Recommendations  Patient destination: Home Follow up Recommendations: Other (comment) (TBD pending progress made while inpatient ) Equipment Recommended: None recommended by SLP    SLP Frequency 3 to 5 out of 7 days   SLP Duration  SLP Intensity  SLP Treatment/Interventions 17-19  days   Minumum of 1-2 x/day, 30 to 90 minutes  Cognitive remediation/compensation;Cueing hierarchy;Functional tasks;Environmental controls;Internal/external aids;Patient/family education    Pain Pain Assessment Faces Pain Scale: Hurts even more Pain Type: Acute pain Pain Location: Head Pain Descriptors / Indicators: Headache Pain Intervention(s): Other (Comment) (premedicated prior to SLP's arrival  )  Prior Functioning Cognitive/Linguistic Baseline: Baseline deficits Baseline deficit details: pt having difficulty managing his own medications per his wife's report, hx of previous stroke and learning disability  Type of Home: Apartment  Lives With: Spouse Available Help at Discharge: Family Education: 5th grade  Vocation: On disability  Function:  Eating Eating                 Cognition Comprehension Comprehension assist level: Follows basic conversation/direction with extra time/assistive device  Expression   Expression assist level: Expresses basic 75 - 89% of the time/requires cueing 10 - 24% of the time. Needs helper to occlude trach/needs to repeat words.  Social Interaction Social Interaction assist level: Interacts appropriately 25 - 49% of time - Needs frequent redirection.  Problem Solving Problem solving assist level: Solves basic 25 - 49% of the time - needs direction more than half the time to initiate, plan or complete simple activities  Memory Memory assist level: Recognizes or recalls 25 - 49% of the time/requires cueing 50 - 75% of the time   Short Term Goals: Week 1: SLP Short Term Goal 1 (Week 1): Pt will recall basic, daily information with mod assist verbal cues for use of external aids.  SLP Short Term Goal 2 (Week 1): Pt will attend to basic, familiar tasks for 5-10 minute intervals with mod verbal cues for redirection.  SLP Short Term Goal 3 (Week  1): Pt will complete basic, familiar tasks with mod assist verbal cues for functional problem solving   Refer to Care Plan for Long Term Goals  Recommendations for other services: None  Discharge Criteria: Patient will be discharged from SLP if patient refuses treatment 3 consecutive times without medical reason, if treatment goals not met, if there is a change in medical status, if patient makes no progress towards goals or if patient is discharged from hospital.  The above assessment, treatment plan,  treatment alternatives and goals were discussed and mutually agreed upon: by patient and by family  Emilio Math 11/23/2015, 4:38 PM

## 2015-11-23 NOTE — Evaluation (Signed)
Physical Therapy Assessment and Plan  Patient Details  Name: Patrick [name] MRN: [id number] Date of Birth: [birth year]  PT Diagnosis: Abnormality of gait, Cognitive deficits and Hemiplegia dominant Rehab Potential: Good ELOS: 14-21   Today's Date: 11/23/2015 PT Individual Time: 1100-1210 PT Individual Time Calculation (min): 70 min    Problem List:  Patient Active Problem List   Diagnosis Date Noted  . Stroke due to embolism ([place]) 11/22/2015  . TIA (transient ischemic attack)   . AKI (acute kidney injury) ([place])   . Other vascular headache   . History of CVA with residual deficit   . Supratherapeutic INR   . Hypertensive emergency 11/18/2015  . Diastolic CHF, chronic ([place]) 11/18/2015  . Hypertensive encephalopathy 11/18/2015  . Hypertensive crisis 11/18/2015  . Headache 11/15/2015  . Thigh pain 11/15/2015  . Hematuria 08/30/2015  . Chronic anemia 03/31/2014  . Healthcare maintenance 03/31/2014  . Permanent atrial fibrillation ([place]) 07/17/2013  . Chronic anticoagulation, on coumadin 07/17/2013  . Tachy-brady syndrome ([place]) 07/17/2013  . Non compliance w medication regimen 06/25/2012  . Acute ischemic VBA thalamic stroke ([place]) 12/11/2010  . Chronic kidney disease (CKD), stage III (moderate) 12/21/2008  . CORONARY ARTERY DISEASE, non obstructive disease in 2005 with cath 07/19/2006  . Diabetes mellitus with severe nonproliferative retinopathy and macular edema 06/05/2006  . Hyperlipidemia 06/05/2006  . Gout 06/05/2006  . DEPRESSION 06/05/2006  . Uncontrolled hypertension 06/05/2006  . Sleep apnea 06/05/2006    Past Medical History:  Past Medical History  Diagnosis Date  . Hypertension   . CKD (chronic kidney disease), stage II   . CAD (coronary artery disease)   . Depression   . Gout   . Hyperlipidemia   . 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 GERD (gastroesophageal reflux disease)   . Diabetes mellitus type II    Insulin dependent  . Upper GI bleeding 07/01/2013  . Paroxysmal atrial fibrillation (HCC)     on coumadin  . Atrial fibrillation ([place])   . Myocardial infarction [facility]) 2000's    "near heart attack" (07/14/2013)  . Shortness of breath     "can happen at any time" (07/14/2013)  . Sleep apnea 07/2010    "has mask at home; seldom uses it" (07/14/2013)  . Stomach ulcer 1950's    "as a teenager"  . Stroke [facility]) ~ 2007; ~ 2009    "memory not as good since" (07/14/2013)  . Permanent atrial fibrillation ([place]) 07/17/2013  . Chronic anticoagulation, on coumadin 07/17/2013  . Tachy-brady syndrome ([place]) 07/17/2013   Past Surgical History:  Past Surgical History  Procedure Laterality Date  . Cardiac catheterization  07/2003    /encounter notes 08/27/2005 (07/01/2013)    Assessment & Plan Clinical Impression: Patrick [name] is a 70 y.o. male with history of CKD, HTN, A fib, DMT2, prior CVAs with cognitive deficits who was admitted on 11/18/15 with hypertensive emergency, HA, weakness and minor right sided weakness. He was admitted due to elevated BP and concerns of PRES. Patient had missed multiple BP doses due to wife being in the hospital. Work up showed acute on chronic renal insufficiency as well as INR -1.97. He was started on cardene drip and home medications resumed. 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 MRI brain negative. Carotid dopplers without significant ICA stenosis. 2 D echo with EF 40-45% and no wall abnormality. Work up negative and neurology has signed off. He had decline in mobility with inability  to stand, RLE stability due to worsening of RLE weakness yesterday. Repeat MRI brain done showing interval development of scattered nonhemorrhagic infarcts medial aspect of posterior frontal and parietal lobes bilaterally, remote left thalamic infarcts, prior hemorrhagic infarct right cerebellar and posterior right temporal-occipital junction and global atrophy. Blood pressures remain  labile requiring IV hydralazine and lisinopril increased for better control.Patient transferred to CIR on 11/22/2015 .   Patient currently requires total with mobility secondary to muscle weakness and muscle joint tightness, decreased cardiorespiratoy endurance, impaired timing and sequencing, unbalanced muscle activation, decreased coordination and decreased motor planning and decreased initiation, decreased attention, decreased awareness, decreased problem solving, decreased safety awareness, decreased memory and delayed processing.  Prior to hospitalization, patient was modified independent  with mobility and lived with wife   in a [place] home.  Home access is  Level entry (threshold).  Patient will benefit from skilled PT intervention to maximize safe functional mobility, minimize fall risk and decrease caregiver burden for planned discharge home with 24 hour assist.  Anticipate patient will benefit from follow up [facility] at discharge.  PT - End of Session Activity Tolerance: Tolerates < 10 min activity, no significant change in vital signs Endurance Deficit: Yes Endurance Deficit Description: lethargic throughout session, frequently closing eyes PT Assessment Rehab Potential (ACUTE/IP ONLY): Good Barriers to Discharge: Decreased caregiver support PT Patient demonstrates impairments in the following area(s): Balance;Endurance;Motor;Safety;Pain PT Transfers Functional Problem(s): Bed Mobility;Bed to Chair;Car;Furniture PT Locomotion Functional Problem(s): Ambulation;Wheelchair Mobility;Stairs PT Plan PT Intensity: Minimum of 1-2 x/day ,45 to 90 minutes PT Frequency: 5 out of 7 days;Total of 15 hours over 7 days of combined therapies PT Duration Estimated Length of Stay: 14-21 PT Treatment/Interventions: Ambulation/gait training;Balance/vestibular training;Community reintegration;Cognitive remediation/compensation;Discharge planning;Functional electrical stimulation;DME/adaptive equipment  instruction;Functional mobility training;Neuromuscular re-education;Patient/family education;Pain management;Psychosocial support;Stair training;Splinting/orthotics;Therapeutic Activities;Therapeutic Exercise;UE/LE Coordination activities;UE/LE Strength taining/ROM;Wheelchair propulsion/positioning PT Transfers Anticipated Outcome(s): supervision PT Locomotion Anticipated Outcome(s): supervision w/c x 150'; gait TBD PT Recommendation Recommendations for Other Services: Neuropsych consult (possibly, for cognitive problems since previous CVa) Follow Up Recommendations: Home health PT Patient destination: Home Equipment Recommended: To be determined  Skilled Therapeutic Intervention:  LTGs and ELOS discussed with wife and sons. Pt extremely slow in verbal processing, responding to commands, movement.  Lethargic throughout eval.  Wife reported he drove himself in the community 2-3x/week, but was generally inactive and had been declining.  He has not used stairs in about a year, and had difficulty then. Pt able to use bil UEs well during sit> stand but not stand> sit.  No active motors detected RLE in standing or transfers. neuromuscular re-education via manual cues, VCS for RLE hip and knee extension in standing facing bed, bil hands on rail, and during wt shifting L><R in standing with max assist.  Pt c/o slight dizziness when standing.  Pt left resting in bed with all needs within reach, and family in room.  PT Evaluation Precautions/Restrictions Precautions Precautions: Fall Restrictions Weight Bearing Restrictions: No General   Vital SignsTherapy Vitals Pulse Rate: 86 BP: (!) 153/93 mmHg Patient Position (if appropriate): Sitting Pain Pain Assessment Pain Assessment: No/denies pain Pain Score: Asleep Pain Type: Acute pain Pain Location: Head Pain Orientation: Anterior Pain Descriptors / Indicators: Headache Pain Frequency: Intermittent Pain Onset: Progressive Patients Stated Pain  Goal: 0 Pain Intervention(s): Medication (See eMAR) Home Living/Prior Functioning Home Living Available Help at Discharge: Family Type of Home: Apartment Home Access: Level entry (threshold) Home Layout: One level Bathroom Shower/Tub: Chiropodist: Standard Bathroom Accessibility: Yes Additional  Comments: patient's wife just here last week due to stroke Prior Function  Able to Take Stairs?: No Driving: Yes Vocation: On disability Comments: needs extra time for all activities; runs errands 2-3 x/week Vision/Perception - wears reading glasses    Cognition Overall Cognitive Status: Within Functional Limits for tasks assessed Arousal/Alertness: Lethargic Orientation Level: Oriented X4 Sensation Sensation Light Touch: Appears Intact Proprioception: Appears Intact (correct 4/5 grreat toe movements) Coordination Gross Motor Movements are Fluid and Coordinated: No Fine Motor Movements are Fluid and Coordinated: No Heel Shin Test: minimal excursion LLE; unable RLE Motor  Motor Motor: Hemiplegia Motor - Skilled Clinical Observations: bil LE movement affected  Mobility Bed Mobility Bed Mobility: Rolling Right;Rolling Left;Sit to Sidelying Right Rolling Right: 5: Supervision Rolling Left: 5: Supervision;With rail Sit to Sidelying Right: 3: Mod assist Sit to Sidelying Right Details: Manual facilitation for weight shifting;Manual facilitation for placement Transfers Transfers: Yes Stand Pivot Transfers: 2: Max assist;With armrests Stand Pivot Transfer Details: Manual facilitation for weight bearing;Manual facilitation for weight shifting;Manual facilitation for placement;Verbal cues for technique Squat Pivot Transfers: 2: Max assist;With armrests Squat Pivot Transfer Details: Manual facilitation for placement;Manual facilitation for weight bearing;Manual facilitation for weight shifting;Verbal cues for technique Locomotion  Ambulation Ambulation: No Gait Gait:  No Wheelchair Mobility Wheelchair Mobility: Yes Wheelchair Assistance: 3: Building surveyor Details: Tactile cues for initiation;Manual facilitation for placement Wheelchair Propulsion: Both upper extremities Wheelchair Parts Management: Needs assistance Distance: 10  Trunk/Postural Assessment  Cervical Assessment Cervical Assessment: Within Functional Limits Thoracic Assessment Thoracic Assessment: Within Functional Limits Lumbar Assessment Lumbar Assessment: Within Functional Limits Postural Control Postural Control: Within Functional Limits  Balance Balance Balance Assessed: Yes Static Sitting Balance Static Sitting - Level of Assistance: 5: Stand by assistance Dynamic Sitting Balance Dynamic Sitting - Level of Assistance: 5: Stand by assistance;4: Min assist Sitting balance - Comments: min assist when reaching R; stand by to reach within BOS in all forward and to L Static Standing Balance Static Standing - Level of Assistance: 3: Mod assist Dynamic Standing Balance Dynamic Standing - Level of Assistance: 1: +1 Total assist (simulated car transfer) Extremity Assessment      RLE Assessment RLE Assessment: Exceptions to [facility] (tight hamstrings and heel cords) RLE Strength RLE Overall Strength Comments: 2-/5 hip abduction; no other activie motors detected LLE Assessment LLE Assessment: Exceptions to [facility] (tight hamstrings and heel cords) LLE Strength LLE Overall Strength Comments: grossly in sitting: 4-/5 hip flex; adduction/abduction, 4/5 knee ext and ankle DF   See Function Navigator for Current Functional Status.   Refer to Care Plan for Long Term Goals  Recommendations for other services: Neuropsych, possibly due to previous CVA with cognitive problems  Discharge Criteria: Patient will be discharged from PT if patient refuses treatment 3 consecutive times without medical reason, if treatment goals not met, if there is a change in medical status, if  patient makes no progress towards goals or if patient is discharged from hospital.  The above assessment, treatment plan, treatment alternatives and goals were discussed and mutually agreed upon: by patient and by family  Cem Kosman 11/23/2015, 12:39 PM

## 2015-11-23 NOTE — Discharge Instructions (Signed)

## 2015-11-23 NOTE — Progress Notes (Signed)
Patient refused CPAP for the night  

## 2015-11-23 NOTE — Progress Notes (Signed)
Patient information reviewed and entered into eRehab system by Alyzae Hawkey, RN, CRRN, PPS Coordinator.  Information including medical coding and functional independence measure will be reviewed and updated through discharge.     Per nursing patient was given "Data Collection Information Summary for Patients in Inpatient Rehabilitation Facilities with attached "Privacy Act Statement-Health Care Records" upon admission.  

## 2015-11-23 NOTE — Evaluation (Signed)
Occupational Therapy Assessment and Plan  Patient Details  Name: Patrick [name] MRN: [id number] Date of Birth: February 23, 1946  OT Diagnosis: abnormal posture, acute pain, altered mental status, cognitive deficits, hemiplegia affecting dominant side and muscle weakness (generalized) Rehab Potential: Rehab Potential (ACUTE ONLY): Good ELOS: 17-19 days   Today's Date: 11/23/2015 OT Individual Time: 1443-1540 OT Individual Time Calculation (min): 75 min     Problem List:  Patient Active Problem List   Diagnosis Date Noted  . Stroke due to embolism ([place]) 11/22/2015  . TIA (transient ischemic attack)   . AKI (acute kidney injury) ([place])   . Other vascular headache   . History of CVA with residual deficit   . Supratherapeutic INR   . Hypertensive emergency 11/18/2015  . Diastolic CHF, chronic ([place]) 11/18/2015  . Hypertensive encephalopathy 11/18/2015  . Hypertensive crisis 11/18/2015  . Headache 11/15/2015  . Thigh pain 11/15/2015  . Hematuria 08/30/2015  . Chronic anemia 03/31/2014  . Healthcare maintenance 03/31/2014  . Permanent atrial fibrillation ([place]) 07/17/2013  . Chronic anticoagulation, on coumadin 07/17/2013  . Tachy-brady syndrome ([place]) 07/17/2013  . Non compliance w medication regimen 06/25/2012  . Acute ischemic VBA thalamic stroke ([place]) 12/11/2010  . Chronic kidney disease (CKD), stage III (moderate) 12/21/2008  . CORONARY ARTERY DISEASE, non obstructive disease in 2005 with cath 07/19/2006  . Diabetes mellitus with severe nonproliferative retinopathy and macular edema 06/05/2006  . Hyperlipidemia 06/05/2006  . Gout 06/05/2006  . DEPRESSION 06/05/2006  . Uncontrolled hypertension 06/05/2006  . Sleep apnea 06/05/2006    Past Medical History:  Past Medical History  Diagnosis Date  . Hypertension   . CKD (chronic kidney disease), stage II   . CAD (coronary artery disease)   . Depression   . Gout   . Hyperlipidemia   . CVA (cerebral infarction) 11/22/10    Thalamic  with residual memory loss and slow speech  . GERD (gastroesophageal reflux disease)   . Diabetes mellitus type II     Insulin dependent  . Upper GI bleeding 07/01/2013  . Paroxysmal atrial fibrillation ([facility])     on coumadin  . Atrial fibrillation ([place])   . Myocardial infarction [facility]) 2000's    "near heart attack" (07/14/2013)  . Shortness of breath     "can happen at any time" (07/14/2013)  . Sleep apnea 07/2010    "has mask at home; seldom uses it" (07/14/2013)  . Stomach ulcer 1950's    "as a teenager"  . Stroke [facility]) ~ 2007; ~ 2009    "memory not as good since" (07/14/2013)  . Permanent atrial fibrillation ([place]) 07/17/2013  . Chronic anticoagulation, on coumadin 07/17/2013  . Tachy-brady syndrome ([place]) 07/17/2013   Past Surgical History:  Past Surgical History  Procedure Laterality Date  . Cardiac catheterization  07/2003    /encounter notes 08/27/2005 (07/01/2013)    Assessment & Plan Clinical Impression: Patient is a 70 y.o. year old male with recent admission to the hospital on 11/18/15 with hypertensive emergency, HA, weakness and minor right sided weakness. He was admitted due to elevated BP and concerns of PRES. Patient had missed multiple BP doses due to wife being in the hospital. Work up showed acute on chronic renal insufficiency as well as INR -1.97. He was started on cardene drip and home medications resumed. 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 MRI brain negative. Carotid dopplers without significant ICA stenosis. 2 D echo with EF 40-45% and no wall abnormality. Work up negative and neurology has signed  off. He had decline in mobility with inability to stand, RLE stability due to worsening of RLE weakness yesterday. Repeat MRI brain done showing interval development of scattered nonhemorrhagic infarcts medial aspect of posterior frontal and parietal lobes bilaterally, remote left thalamic infarcts, prior hemorrhagic infarct right cerebellar and posterior right  temporal-occipital junction and global atrophy   Patient transferred to CIR on 11/22/2015 .    Patient currently requires max with basic self-care skills secondary to muscle weakness, impaired timing and sequencing, unbalanced muscle activation and decreased coordination and decreased sitting balance, decreased standing balance, decreased postural control, hemiplegia and decreased balance strategies.  Prior to hospitalization, patient could complete ADLs with independent .  Patient will benefit from skilled intervention to decrease level of assist with basic self-care skills, increase independence with basic self-care skills and increase level of independence with iADL prior to discharge home with care partner.  Anticipate patient will require minimal physical assistance and follow up home health.  OT - End of Session Activity Tolerance: Tolerates < 10 min activity, no significant change in vital signs Endurance Deficit: Yes Endurance Deficit Description: Pt voiceing and demonstrating fatigue with bathing.   OT Assessment Rehab Potential (ACUTE ONLY): Good Barriers to Discharge: Decreased caregiver support Barriers to Discharge Comments: family will need to arrange for initial 24 hour supervision/min assist depending on pt's progress OT Patient demonstrates impairments in the following area(s): Balance;Motor;Cognition;Endurance;Pain;Safety OT Basic ADL's Functional Problem(s): Grooming;Bathing;Dressing;Toileting OT Transfers Functional Problem(s): Toilet;Tub/Shower OT Additional Impairment(s): Fuctional Use of Upper Extremity OT Plan OT Intensity: Minimum of 1-2 x/day, 45 to 90 minutes OT Frequency: 5 out of 7 days OT Duration/Estimated Length of Stay: 17-19 days OT Treatment/Interventions: Balance/vestibular training;DME/adaptive equipment instruction;Patient/family education;Discharge planning;Functional mobility training;Community reintegration;Cognitive remediation/compensation;Disease  mangement/prevention;Neuromuscular re-education;Self Care/advanced ADL retraining;Therapeutic Exercise;UE/LE Strength taining/ROM;Pain management;UE/LE Coordination activities;Therapeutic Activities;Psychosocial support OT Self Feeding Anticipated Outcome(s): independent OT Basic Self-Care Anticipated Outcome(s): supervision to min assist OT Toileting Anticipated Outcome(s): min assist OT Bathroom Transfers Anticipated Outcome(s): min assist OT Recommendation Patient destination: Home Follow Up Recommendations: Home health OT Equipment Recommended: To be determined   Skilled Therapeutic Intervention Began working on selfcare retraining by completing ADL sitting EOB unsupported.  Pt with severe fatigue during session, having to stop and prop his hands in his lap to get his breath.  BP taken in supine at 178/120 and in sitting during bathing/dressing at 191/116.  Pt only needing min guard assist for sitting balance initially during bathing but regressed secondary to fatigue to max assist with increased LOB posteriorly.  Max assist for sit to stand with +2 for managing LB clothing and washing peri area.  Increased posterior lean in standing with increased bilateral knee flexion.  Pt needed total assist for transfer stand pivot to bedside chair as well, with decreased ability to advance the RLE.  Discussed expectations with pt's progress and the amount of assist he will likely need at discharge.  Pt stating that one of his goals is to "walk I guess".  Pt left in recliner with safety belt in place and call button in reach.    OT Evaluation Precautions/Restrictions  Precautions Precautions: Fall Precaution Comments: RLE hemiparesis Restrictions Weight Bearing Restrictions: No  Vital Signs Therapy Vitals Temp: 97.8 F (36.6 C) Temp Source: Oral Pulse Rate: 93 Resp: 18 BP: (!) 188/114 mmHg Patient Position (if appropriate): Lying Oxygen Therapy SpO2: 100 % O2 Device: Not  Delivered Pain Pain Assessment Pain Assessment: 0-10 Pain Score: 7  Pain Type: Acute pain Pain Location: Head Pain  Descriptors / Indicators: Headache Pain Intervention(s): Other (Comment) (premedicated prior to SLP's arrival ) Home Living/Prior Functioning Home Living Available Help at Discharge: Family Type of Home: Apartment Home Access: Level entry Home Layout: One level Bathroom Shower/Tub: Government social research officer Accessibility: Yes Additional Comments: patient's wife just here last week due to stroke  Lives With: Spouse IADL History Current License: Yes Mode of TransportationOccupational psychologist Education: 5th grade  Prior Function Level of Independence: Independent with basic ADLs  Able to Take Stairs?: No Driving: Yes Vocation: On disability Comments: needs extra time for all activities; runs errands 2-3 x/week ADL  See Function Section of Chart for details  Vision/Perception  Vision- History Baseline Vision/History: Wears glasses Wears Glasses: At all times Patient Visual Report: No change from baseline Vision- Assessment Vision Assessment?: No apparent visual deficits  Cognition Overall Cognitive Status: History of cognitive impairments - at baseline Arousal/Alertness: Lethargic Year: 2017 Month: September Day of Week: Incorrect Memory: Impaired Memory Impairment: Decreased short term memory Decreased Short Term Memory: Functional basic Immediate Memory Recall: Sock;Blue;Bed Memory Recall:  (unable to recall any after delay) Attention: Focused;Sustained Focused Attention: Appears intact Sustained Attention: Impaired Sustained Attention Impairment: Functional basic;Functional complex Awareness: Impaired Awareness Impairment: Anticipatory impairment Problem Solving: Impaired Behaviors: Other (comment) (Flat affect) Safety/Judgment: Impaired Comments: eval limited due to pain and lethargy  Sensation Sensation Light Touch: Appears  Intact Stereognosis: Appears Intact Hot/Cold: Appears Intact Proprioception: Appears Intact Coordination Gross Motor Movements are Fluid and Coordinated: No Fine Motor Movements are Fluid and Coordinated: No Coordination and Movement Description: Slight coordination impairment with regards to speed and FM use in the RUE but did not affect ADL performance or independence. Motor  Motor Motor: Hemiplegia;Paraplegia Motor - Discharge Observations: Pt with BLE weakness with right being more impaired than left. Mobility  Bed Mobility Bed Mobility: Supine to Sit Supine to Sit: 3: Mod assist;HOB flat;With rails Supine to Sit Details: Manual facilitation for placement Transfers Transfers: Sit to Stand;Stand to Sit Sit to Stand: 2: Max assist;From bed;With upper extremity assist Stand to Sit: To bed;2: Max assist;With upper extremity assist  Trunk/Postural Assessment  Cervical Assessment Cervical Assessment: Within Functional Limits Thoracic Assessment Thoracic Assessment: Within Functional Limits Lumbar Assessment Lumbar Assessment: Within Functional Limits Postural Control Postural Control: Deficits on evaluation Postural Limitations: increased posterior pelvic tilt noted sitting EOB during ADL session.   Balance Balance Balance Assessed: Yes Static Sitting Balance Static Sitting - Balance Support: Bilateral upper extremity supported Static Sitting - Level of Assistance: 5: Stand by assistance Dynamic Sitting Balance Dynamic Sitting - Balance Support: No upper extremity supported Dynamic Sitting - Level of Assistance: 2: Max assist Sitting balance - Comments: Pt initially only needing min guard assist for sitting balance with bathing, but regressed to needing max assist secondary to fatigue with increased LOB to the right and posteriorly when attempting to donn pullover shirt.  Static Standing Balance Static Standing - Level of Assistance: 2: Max assist Dynamic Standing  Balance Dynamic Standing - Level of Assistance: 1: +1 Total assist Extremity/Trunk Assessment RUE Assessment RUE Assessment: Exceptions to [facility] WFLS for all joints, shoulder strength 4/5 as well as elbow flexion and gross grasp.  Slight decreased coordination noted with functional use during bathing compared to the LUE with regards to reaching for washcloth and wringing it out.  ) LUE Assessment LUE Assessment: Within Functional Limits   See Function Navigator for Current Functional Status.   Refer to Care Plan for Long Term Goals  Recommendations for other services: None  Discharge Criteria: Patient will be discharged from OT if patient refuses treatment 3 consecutive times without medical reason, if treatment goals not met, if there is a change in medical status, if patient makes no progress towards goals or if patient is discharged from hospital.  The above assessment, treatment plan, treatment alternatives and goals were discussed and mutually agreed upon: by patient  Lido Maske OTR/L 11/23/2015, 5:03 PM

## 2015-11-23 NOTE — Progress Notes (Signed)
69 y.o. male with history of CKD, HTN, A fib, DMT2, prior CVAs with cognitive deficits who was admitted on 11/18/15 with hypertensive emergency, HA, weakness and minor right sided weakness. He was admitted due to elevated BP and concerns of PRES. Patient had missed multiple BP doses due to wife being in the hospital. Work up showed acute on chronic renal insufficiency as well as INR -1.97. He was started on cardene drip and home medications resumed. 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 MRI brain negative. Carotid dopplers without significant ICA stenosis. 2 D echo with EF 40-45% and no wall abnormality. Work up negative and neurology has signed off. He had decline in mobility with inability to stand, RLE stability due to worsening of RLE weakness yesterday. Repeat MRI brain done showing interval development of scattered nonhemorrhagic infarcts medial aspect of posterior frontal and parietal lobes bilaterally, remote left thalamic infarcts, prior hemorrhagic infarct right cerebellar and posterior right temporal-occipital junction and global atrophy  Subjective/Complaints: No pain issues  ROS- denies any CP/SOB, N/V/D, +HA  Objective: Vital Signs: Blood pressure 176/120, pulse 95, temperature 98.8 F (37.1 C), temperature source Oral, resp. rate 18, height 5' 9.5" (1.765 m), weight 80 kg (176 lb 5.9 oz), SpO2 98 %. Mr Brain Wo Contrast  11/21/2015  CLINICAL DATA:  69-year-old hypertensive male with headache and right-sided weakness. Subsequent encounter. EXAM: MRI HEAD WITHOUT CONTRAST TECHNIQUE: Multiplanar, multiecho pulse sequences of the brain and surrounding structures were obtained without intravenous contrast. COMPARISON:  11/18/2015 MR and CT. FINDINGS: Interval development of scattered small nonhemorrhagic infarcts medial aspect of the posterior frontal and parietal lobes bilaterally. Remote left thalamic infarct. Blood breakdown products right cerebellum and posterior right temporal  -occipital junction suggestive of result of prior hemorrhagic insult. Mild chronic microvascular changes. Global atrophy. Prominent extra-axial spaces over the convexities. Although unchanged from the recent examination, slight increase in size when compared to 2012 (now measuring 1.1 cm on the right and 1.5 cm on the left versus remote 0.9 cm on the right and 1.4 cm on the left in 2012). Inward and slight downward displacement of adjacent brain parenchyma. 3.8 mm midline shift to the left. This may represent the presence of bilateral subdural hygromas. No intracranial mass lesion noted on this unenhanced exam. Major intracranial vascular structures are patent. Superimposed atherosclerotic changes of the vertebral arteries suspected. This is more notable involving the right vertebral artery. Partially empty non expanded sella. Cervical spondylotic changes C3-4. Cervical medullary junction unremarkable. IMPRESSION: Interval development of scattered small nonhemorrhagic infarcts medial aspect of the posterior frontal and parietal lobes bilaterally. Remainder of findings similar to the recent exam including: Remote left thalamic infarct. Blood breakdown products right cerebellum and posterior right temporal -occipital junction suggestive of result of prior hemorrhagic insult. Mild chronic microvascular changes. Global atrophy. Prominent extra-axial spaces over the convexities. Although unchanged from the recent examination, slight increase in size when compared to 2012. Inward and slight downward displacement of adjacent brain parenchyma. 3.8 mm midline shift to the left. This may represent the presence of bilateral subdural hygromas. Major intracranial vascular structures are patent. Superimposed atherosclerotic changes of the vertebral arteries suspected. This is more notable involving the right vertebral artery. Electronically Signed   By: Steven  Olson M.D.   On: 11/21/2015 18:20   Results for orders placed or  performed during the hospital encounter of 11/22/15 (from the past 72 hour(s))  Glucose, capillary     Status: Abnormal   Collection Time: 11/22/15  5:24 PM    Result Value Ref Range   Glucose-Capillary 195 (H) 65 - 99 mg/dL  Glucose, capillary     Status: Abnormal   Collection Time: 11/22/15  8:58 PM  Result Value Ref Range   Glucose-Capillary 258 (H) 65 - 99 mg/dL  CBC WITH DIFFERENTIAL     Status: Abnormal   Collection Time: 11/23/15  5:15 AM  Result Value Ref Range   WBC 7.2 4.0 - 10.5 K/uL   RBC 5.56 4.22 - 5.81 MIL/uL   Hemoglobin 14.0 13.0 - 17.0 g/dL   HCT 43.1 39.0 - 52.0 %   MCV 77.5 (L) 78.0 - 100.0 fL   MCH 25.2 (L) 26.0 - 34.0 pg   MCHC 32.5 30.0 - 36.0 g/dL   RDW 14.5 11.5 - 15.5 %   Platelets 170 150 - 400 K/uL   Neutrophils Relative % 73 %   Neutro Abs 5.2 1.7 - 7.7 K/uL   Lymphocytes Relative 15 %   Lymphs Abs 1.1 0.7 - 4.0 K/uL   Monocytes Relative 12 %   Monocytes Absolute 0.9 0.1 - 1.0 K/uL   Eosinophils Relative 1 %   Eosinophils Absolute 0.1 0.0 - 0.7 K/uL   Basophils Relative 0 %   Basophils Absolute 0.0 0.0 - 0.1 K/uL  Comprehensive metabolic panel     Status: Abnormal   Collection Time: 11/23/15  5:15 AM  Result Value Ref Range   Sodium 134 (L) 135 - 145 mmol/L   Potassium 3.5 3.5 - 5.1 mmol/L   Chloride 99 (L) 101 - 111 mmol/L   CO2 23 22 - 32 mmol/L   Glucose, Bld 131 (H) 65 - 99 mg/dL   BUN 42 (H) 6 - 20 mg/dL   Creatinine, Ser 3.37 (H) 0.61 - 1.24 mg/dL   Calcium 9.1 8.9 - 10.3 mg/dL   Total Protein 6.2 (L) 6.5 - 8.1 g/dL   Albumin 3.2 (L) 3.5 - 5.0 g/dL   AST 26 15 - 41 U/L   ALT 25 17 - 63 U/L   Alkaline Phosphatase 70 38 - 126 U/L   Total Bilirubin 0.9 0.3 - 1.2 mg/dL   GFR calc non Af Amer 17 (L) >60 mL/min   GFR calc Af Amer 20 (L) >60 mL/min    Comment: (NOTE) The eGFR has been calculated using the CKD EPI equation. This calculation has not been validated in all clinical situations. eGFR's persistently <60 mL/min signify possible  Chronic Kidney Disease.    Anion gap 12 5 - 15  Protime-INR     Status: Abnormal   Collection Time: 11/23/15  5:15 AM  Result Value Ref Range   Prothrombin Time 33.8 (H) 11.6 - 15.2 seconds   INR 3.42 (H) 0.00 - 1.49  Glucose, capillary     Status: Abnormal   Collection Time: 11/23/15  6:37 AM  Result Value Ref Range   Glucose-Capillary 125 (H) 65 - 99 mg/dL     HEENT: normal Cardio: RRR and no murmur Resp: CTA B/L and unlabored GI: BS positive and NT, ND Extremity:  Pulses positive and No Edema Skin:   Intact Neuro: Alert/Oriented, Normal Sensory, Abnormal Motor 4/5 in RUE , 2- RLE, 5/5 on L side, Abnormal FMC Ataxic/ dec FMC and Dysarthric Musc/Skel:  Other no pain with UE or LE ROM, no joint swelling Gen NAD   Assessment/Plan: 1. Functional deficits secondary to bilateral posterior frontal and parietal infarct with R LE> UE paresis which require 3+ hours per day of interdisciplinary therapy in a   comprehensive inpatient rehab setting. Physiatrist is providing close team supervision and 24 hour management of active medical problems listed below. Physiatrist and rehab team continue to assess barriers to discharge/monitor patient progress toward functional and medical goals. FIM:                                  Medical Problem List and Plan: 1.  Mobilty,cognitive and functional deficits secondary to bi-cerebral embolic CVA              -Initiate inpatient rehab 2.  DVT Prophylaxis/Anticoagulation: Pharmaceutical: Coumadin              -pt subtherapeutic upon admision 3. Pain Management: tylenol and tramadol prn for headache             -bp control over time 4. Mood: team to provide ego support 5. Neuropsych: This patient is not capable of making decisions on his own behalf--question of mild cognitive deficits at baseline.   6. Skin/Wound Care: routine pressure relief measures. Add supplements between meals.   7. Fluids/Electrolytes/Nutrition:  Monitor  I/O. Check lytes in am. 8. HTN: permissive hypertension with prn hydralazine only for SBP >220 or DBP >120.             -holding  catapres, hctz, lisinopril but will restart catapres .1mg Filed Vitals:   11/23/15 0634 11/23/15 0735  BP: 222/111 176/120  Pulse:  95  Temp:    Resp:  18   9. A Fib: cardizem/warfarin. Monitor HR with exertion 10 Hyponatremia: mild. Encourage appropriate nutrition 11. CKD: Cr 3.37 today.Initiate IVF Follow i's and o's, check serial labs 12. H/o gout: allopurinol 13. Gastric ulcers/GIB:  Protonix, monitor any bleeding while on warfarin             -hgb 13.9  LOS (Days) 1 A FACE TO FACE EVALUATION WAS PERFORMED  KIRSTEINS,ANDREW E 11/23/2015, 8:17 AM    

## 2015-11-23 NOTE — Progress Notes (Signed)
ANTICOAGULATION CONSULT NOTE - Follow Up Consult  Pharmacy Consult for coumadin Indication: atrial fibrillation  No Known Allergies  Patient Measurements: Height: 5' 9.5" (176.5 cm) Weight: 176 lb 5.9 oz (80 kg) IBW/kg (Calculated) : 71.85 Heparin Dosing Weight:   Vital Signs: Temp: 98.8 F (37.1 C) (04/26 0622) Temp Source: Oral (04/26 0622) BP: 176/120 mmHg (04/26 0735) Pulse Rate: 95 (04/26 0735)  Labs:  Recent Labs  11/20/15 1126 11/20/15 1955 11/21/15 0210 11/21/15 1602 11/22/15 0540 11/23/15 0515  HGB  --   --   --   --  13.9 14.0  HCT  --   --   --   --  42.1 43.1  PLT  --   --   --   --  196 170  LABPROT  --   --  32.3*  --  30.1* 33.8*  INR  --   --  3.22*  --  2.94* 3.42*  CREATININE  --   --  2.33*  --  2.37* 3.37*  CKTOTAL  --   --   --  67  --   --   TROPONINI 0.10* 0.09*  --   --   --   --     Estimated Creatinine Clearance: 21.6 mL/min (by C-G formula based on Cr of 3.37).   Medications:  Scheduled:  . allopurinol  200 mg Oral Daily  . aspirin EC  81 mg Oral Daily  . diclofenac sodium  4 g Topical QID  . diltiazem  120 mg Oral Daily  . insulin aspart  0-20 Units Subcutaneous TID WC  . insulin aspart  0-5 Units Subcutaneous QHS  . polyethylene glycol  17 g Oral Daily  . Warfarin - Pharmacist Dosing Inpatient   Does not apply q1800   Infusions:    Assessment: 70 yo male with afib is currently on supratherapeutic coumadin.  INR up to 3.42 from 2.94.  Goal of Therapy:  INR 2-3 Monitor platelets by anticoagulation protocol: Yes   Plan:  - no coumadin tonight - INR in am  Scotlynn Noyes, Tsz-Yin 11/23/2015,8:16 AM

## 2015-11-24 ENCOUNTER — Inpatient Hospital Stay (HOSPITAL_COMMUNITY): Payer: Medicare Other | Admitting: Occupational Therapy

## 2015-11-24 ENCOUNTER — Inpatient Hospital Stay (HOSPITAL_COMMUNITY): Payer: Medicare Other | Admitting: Speech Pathology

## 2015-11-24 ENCOUNTER — Inpatient Hospital Stay (HOSPITAL_COMMUNITY): Payer: Medicare Other | Admitting: Physical Therapy

## 2015-11-24 ENCOUNTER — Encounter: Payer: Medicare Other | Admitting: Pulmonary Disease

## 2015-11-24 LAB — URINALYSIS, ROUTINE W REFLEX MICROSCOPIC
BILIRUBIN URINE: NEGATIVE
GLUCOSE, UA: 100 mg/dL — AB
Ketones, ur: NEGATIVE mg/dL
Leukocytes, UA: NEGATIVE
Nitrite: NEGATIVE
PH: 5.5 (ref 5.0–8.0)
Protein, ur: >300 mg/dL — AB
SPECIFIC GRAVITY, URINE: 1.019 (ref 1.005–1.030)

## 2015-11-24 LAB — PROTIME-INR
INR: 2.93 — ABNORMAL HIGH (ref 0.00–1.49)
Prothrombin Time: 30.1 seconds — ABNORMAL HIGH (ref 11.6–15.2)

## 2015-11-24 LAB — CBC
HEMATOCRIT: 41.4 % (ref 39.0–52.0)
Hemoglobin: 13.4 g/dL (ref 13.0–17.0)
MCH: 25.3 pg — ABNORMAL LOW (ref 26.0–34.0)
MCHC: 32.4 g/dL (ref 30.0–36.0)
MCV: 78.3 fL (ref 78.0–100.0)
Platelets: 186 10*3/uL (ref 150–400)
RBC: 5.29 MIL/uL (ref 4.22–5.81)
RDW: 14.4 % (ref 11.5–15.5)
WBC: 8 10*3/uL (ref 4.0–10.5)

## 2015-11-24 LAB — GLUCOSE, CAPILLARY
GLUCOSE-CAPILLARY: 173 mg/dL — AB (ref 65–99)
Glucose-Capillary: 175 mg/dL — ABNORMAL HIGH (ref 65–99)
Glucose-Capillary: 210 mg/dL — ABNORMAL HIGH (ref 65–99)
Glucose-Capillary: 226 mg/dL — ABNORMAL HIGH (ref 65–99)

## 2015-11-24 LAB — URINE MICROSCOPIC-ADD ON

## 2015-11-24 MED ORDER — WARFARIN SODIUM 5 MG PO TABS
2.5000 mg | ORAL_TABLET | Freq: Once | ORAL | Status: AC
  Administered 2015-11-24: 2.5 mg via ORAL
  Filled 2015-11-24: qty 1

## 2015-11-24 MED ORDER — DILTIAZEM HCL ER COATED BEADS 180 MG PO CP24
180.0000 mg | ORAL_CAPSULE | Freq: Every day | ORAL | Status: DC
  Administered 2015-11-24: 180 mg via ORAL
  Filled 2015-11-24 (×2): qty 1

## 2015-11-24 MED ORDER — ACETAMINOPHEN-CODEINE #3 300-30 MG PO TABS
1.0000 | ORAL_TABLET | ORAL | Status: DC | PRN
  Administered 2015-11-24 – 2015-12-05 (×7): 1 via ORAL
  Filled 2015-11-24 (×7): qty 1

## 2015-11-24 NOTE — Progress Notes (Signed)
70 y.o. male with history of CKD, HTN, A fib, DMT2, prior CVAs with cognitive deficits who was admitted on 11/18/15 with hypertensive emergency, HA, weakness and minor right sided weakness. He was admitted due to elevated BP and concerns of PRES. Patient had missed multiple BP doses due to wife being in the hospital. Work up showed acute on chronic renal insufficiency as well as INR -1.97. He was started on cardene drip and home medications resumed. 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 MRI brain negative. Carotid dopplers without significant ICA stenosis. 2 D echo with EF 40-45% and no wall abnormality. Work up negative and neurology has signed off. He had decline in mobility with inability to stand, RLE stability due to worsening of RLE weakness yesterday. Repeat MRI brain done showing interval development of scattered nonhemorrhagic infarcts medial aspect of posterior frontal and parietal lobes bilaterally, remote left thalamic infarcts, prior hemorrhagic infarct right cerebellar and posterior right temporal-occipital junction and global atrophy  Subjective/Complaints: + HA, not a problem at home  Blood in urine noted by RN, +clot in urinal   ROS- denies any CP/SOB, N/V/D, +HA  Objective: Vital Signs: Blood pressure 147/115, pulse 110, temperature 97.8 F (36.6 C), temperature source Oral, resp. rate 18, height 5' 9.5" (1.765 m), weight 73.2 kg (161 lb 6 oz), SpO2 98 %. No results found. Results for orders placed or performed during the hospital encounter of 11/22/15 (from the past 72 hour(s))  Glucose, capillary     Status: Abnormal   Collection Time: 11/22/15  5:24 PM  Result Value Ref Range   Glucose-Capillary 195 (H) 65 - 99 mg/dL  Glucose, capillary     Status: Abnormal   Collection Time: 11/22/15  8:58 PM  Result Value Ref Range   Glucose-Capillary 258 (H) 65 - 99 mg/dL  CBC WITH DIFFERENTIAL     Status: Abnormal   Collection Time: 11/23/15  5:15 AM  Result Value Ref  Range   WBC 7.2 4.0 - 10.5 K/uL   RBC 5.56 4.22 - 5.81 MIL/uL   Hemoglobin 14.0 13.0 - 17.0 g/dL   HCT 43.1 39.0 - 52.0 %   MCV 77.5 (L) 78.0 - 100.0 fL   MCH 25.2 (L) 26.0 - 34.0 pg   MCHC 32.5 30.0 - 36.0 g/dL   RDW 14.5 11.5 - 15.5 %   Platelets 170 150 - 400 K/uL   Neutrophils Relative % 73 %   Neutro Abs 5.2 1.7 - 7.7 K/uL   Lymphocytes Relative 15 %   Lymphs Abs 1.1 0.7 - 4.0 K/uL   Monocytes Relative 12 %   Monocytes Absolute 0.9 0.1 - 1.0 K/uL   Eosinophils Relative 1 %   Eosinophils Absolute 0.1 0.0 - 0.7 K/uL   Basophils Relative 0 %   Basophils Absolute 0.0 0.0 - 0.1 K/uL  Comprehensive metabolic panel     Status: Abnormal   Collection Time: 11/23/15  5:15 AM  Result Value Ref Range   Sodium 134 (L) 135 - 145 mmol/L   Potassium 3.5 3.5 - 5.1 mmol/L   Chloride 99 (L) 101 - 111 mmol/L   CO2 23 22 - 32 mmol/L   Glucose, Bld 131 (H) 65 - 99 mg/dL   BUN 42 (H) 6 - 20 mg/dL   Creatinine, Ser 3.37 (H) 0.61 - 1.24 mg/dL   Calcium 9.1 8.9 - 10.3 mg/dL   Total Protein 6.2 (L) 6.5 - 8.1 g/dL   Albumin 3.2 (L) 3.5 - 5.0 g/dL  AST 26 15 - 41 U/L   ALT 25 17 - 63 U/L   Alkaline Phosphatase 70 38 - 126 U/L   Total Bilirubin 0.9 0.3 - 1.2 mg/dL   GFR calc non Af Amer 17 (L) >60 mL/min   GFR calc Af Amer 20 (L) >60 mL/min    Comment: (NOTE) The eGFR has been calculated using the CKD EPI equation. This calculation has not been validated in all clinical situations. eGFR's persistently <60 mL/min signify possible Chronic Kidney Disease.    Anion gap 12 5 - 15  Protime-INR     Status: Abnormal   Collection Time: 11/23/15  5:15 AM  Result Value Ref Range   Prothrombin Time 33.8 (H) 11.6 - 15.2 seconds   INR 3.42 (H) 0.00 - 1.49  Glucose, capillary     Status: Abnormal   Collection Time: 11/23/15  6:37 AM  Result Value Ref Range   Glucose-Capillary 125 (H) 65 - 99 mg/dL  Glucose, capillary     Status: Abnormal   Collection Time: 11/23/15 11:45 AM  Result Value Ref Range    Glucose-Capillary 209 (H) 65 - 99 mg/dL  Glucose, capillary     Status: Abnormal   Collection Time: 11/23/15  5:07 PM  Result Value Ref Range   Glucose-Capillary 121 (H) 65 - 99 mg/dL  Glucose, capillary     Status: Abnormal   Collection Time: 11/23/15  8:47 PM  Result Value Ref Range   Glucose-Capillary 222 (H) 65 - 99 mg/dL  Protime-INR     Status: Abnormal   Collection Time: 11/24/15  4:58 AM  Result Value Ref Range   Prothrombin Time 30.1 (H) 11.6 - 15.2 seconds   INR 2.93 (H) 0.00 - 1.49     HEENT: normal Cardio: RRR and no murmur Resp: CTA B/L and unlabored GI: BS positive and NT, ND GU:  sm amt meatal blood mixed with urine Extremity:  Pulses positive and No Edema Skin:   Intact Neuro: Alert/Oriented, Normal Sensory, Abnormal Motor 4/5 in RUE , 2- RLE, 5/5 on L side, Abnormal FMC Ataxic/ dec FMC and Dysarthric Musc/Skel:  Other no pain with UE or LE ROM, no joint swelling Gen NAD   Assessment/Plan: 1. Functional deficits secondary to bilateral posterior frontal and parietal infarct with R LE> UE paresis which require 3+ hours per day of interdisciplinary therapy in a comprehensive inpatient rehab setting. Physiatrist is providing close team supervision and 24 hour management of active medical problems listed below. Physiatrist and rehab team continue to assess barriers to discharge/monitor patient progress toward functional and medical goals. FIM: Function - Bathing Position: Sitting EOB Body parts bathed by patient: Right arm, Left arm, Chest, Abdomen, Right upper leg, Left upper leg Body parts bathed by helper: Back, Left lower leg, Right lower leg, Front perineal area, Buttocks Assist Level: 2 helpers  Function- Upper Body Dressing/Undressing What is the patient wearing?: Pull over shirt/dress Pull over shirt/dress - Perfomed by patient: Thread/unthread right sleeve, Thread/unthread left sleeve, Put head through opening Pull over shirt/dress - Perfomed by  helper: Pull shirt over trunk Function - Lower Body Dressing/Undressing What is the patient wearing?: Underwear, Pants, Ted Hose, Non-skid slipper socks Underwear - Performed by patient: Thread/unthread left underwear leg Underwear - Performed by helper: Thread/unthread right underwear leg, Thread/unthread left underwear leg Pants- Performed by patient: Thread/unthread left pants leg Pants- Performed by helper: Thread/unthread right pants leg, Pull pants up/down, Fasten/unfasten pants Non-skid slipper socks- Performed by helper: Don/doff right sock,  Don/doff left sock TED Hose - Performed by helper: Don/doff right TED hose, Don/doff left TED hose Assist for lower body dressing: 2 Helpers        Function - Chair/bed transfer Chair/bed transfer method: Stand pivot Chair/bed transfer assist level: Maximal assist (Pt 25 - 49%/lift and lower) Chair/bed transfer assistive device: Armrests Chair/bed transfer details: Verbal cues for technique, Verbal cues for precautions/safety, Verbal cues for sequencing, Manual facilitation for placement, Manual facilitation for weight bearing, Manual facilitation for weight shifting  Function - Locomotion: Wheelchair Will patient use wheelchair at discharge?: Yes Type: Manual Max wheelchair distance: 10 (extremely slowly) Assist Level: Moderate assistance (Pt 50 - 74%) Wheel 50 feet with 2 turns activity did not occur: Safety/medical concerns (lethargy,fatigue) Wheel 150 feet activity did not occur: Safety/medical concerns (lethargy, fatigue) Turns around,maneuvers to table,bed, and toilet,negotiates 3% grade,maneuvers on rugs and over doorsills: No Function - Locomotion: Ambulation Ambulation activity did not occur: Safety/medical concerns (unsafe due to knee buckling)  Function - Comprehension Comprehension: Auditory Comprehension assist level: Follows basic conversation/direction with extra time/assistive device  Function - Expression Expression:  Verbal Expression assist level: Expresses basic 75 - 89% of the time/requires cueing 10 - 24% of the time. Needs helper to occlude trach/needs to repeat words.  Function - Social Interaction Social Interaction assist level: Interacts appropriately 25 - 49% of time - Needs frequent redirection.  Function - Problem Solving Problem solving assist level: Solves basic 25 - 49% of the time - needs direction more than half the time to initiate, plan or complete simple activities  Function - Memory Memory assist level: Recognizes or recalls 25 - 49% of the time/requires cueing 50 - 75% of the time Patient normally able to recall (first 3 days only): Current season, Location of own room, That he or she is in a hospital  Medical Problem List and Plan: 1.  Mobilty,cognitive and functional deficits secondary to bi-cerebral embolic CVA              -Initiate inpatient rehab 2.  DVT Prophylaxis/Anticoagulation: Pharmaceutical: Coumadin              -pt supratherapeutic with hematuria 3. Pain Management:  T#3 prn for headache, d/c tramadol, if not better as BP normalizes will trial topamax       4. Mood: team to provide ego support 5. Neuropsych: This patient is not capable of making decisions on his own behalf--question of mild cognitive deficits at baseline.   6. Skin/Wound Care: routine pressure relief measures. Add supplements between meals.   7. Fluids/Electrolytes/Nutrition:  Monitor I/O. Check lytes in am. 8. HTN: permissive hypertension with prn hydralazine only for SBP >220 or DBP >120.             -holding  catapres, hctz, lisinopril but will restart catapres .29m TID, increase calanSR to 180  Filed Vitals:   11/23/15 1500 11/24/15 0626  BP: 188/114 147/115  Pulse: 93 110  Temp: 97.8 F (36.6 C) 97.8 F (36.6 C)  Resp: 18 18   9. A Fib: cardizem/warfarin. Mild RVR  Increase calan- ? Pain related Monitor HR with exertion 10 Hyponatremia: mild. Encourage appropriate nutrition 11. CKD: Cr  3.37 today.Initiate IVF Follow i's and o's, repeat BMET in am 150 H/o gout: allopurinol 13. Gastric ulcers/GIB:  Protonix, monitor any bleeding while on warfarin             -hgb 13.9 14.  Hematuria without dysuria will check UA C and S  LOS (  Days) 2 A FACE TO FACE EVALUATION WAS PERFORMED  Arnika Larzelere E 11/24/2015, 7:37 AM

## 2015-11-24 NOTE — Progress Notes (Signed)
Physical Therapy Session Note  Patient Details  Name: Patrick [name] MRN: [id number] Date of Birth: Feb 25, 1946  Today's Date: 11/24/2015 PT Individual Time: 0802-0852 PT Individual Time Calculation (min): 50 min   Short Term Goals: Week 1:  PT Short Term Goal 1 (Week 1): pt will perform supine> sit with min assist PT Short Term Goal 2 (Week 1): pt will transfer bed>< w/c wiht mod assist PT Short Term Goal 3 (Week 1): pt will propel w/c x 50' with min assist PT Short Term Goal 4 (Week 1): pt will perform gait x 15' with 2 helpers  Skilled Therapeutic Interventions/Progress Updates:    Pt received in bed, eating breakfast & agreeable to PT with encouragement from therapist. [name], Patrick [name], present & reporting pt did not sleep well 2/2 pain last night. Pt reported 8/10 headache during session & PT notified RN. Pt transferred supine>sit with HOB elevated & use of bed rails. Pt unable to initiate movement with RLE & required assistance to move extremity to EOB. Pt able to utilized bed rails & HOB elevated to pull with BUE to sitting EOB. Sitting EOB pt required stand by<>Mod A for sitting balance. Pt able to support self with LUE but required maximum cuing to do so. While eating breakfast at EOB PT educated pt on holding fork to get better grasp of utensil. Pt able to initiate anterior leans to lean toward tray. Pt would intermittently lose balance posteriorly & laterally in both directions. After sitting EOB x 10 minutes pt requested to return to bed & required total A to do so. PT helped position LUE & LLE on bed, with bed in [place] for assistance & pt able to scoot to head of bed with multiple attempts. Vitals assessed & as follows: BP = 238/119 mmHg, HR = 67 & increased to 124 bpm, Spo2 = 97% on room air. PT notified RN & PA of pt's elevated BP. RN assessed pt's BP manually in supine & found to be 220/110 mmHg. Per PA's instructions PT held rest of treatment session 2/2 pt's elevated BP at  rest. PT & RN performed +2 Assist to scoot pt to head of bed. Pt left in bed with all needs within reach & RN & son present.  Therapy Documentation Precautions:  Precautions Precautions: Fall Precaution Comments: RLE hemiparesis Restrictions Weight Bearing Restrictions: No General: PT Amount of Missed Time (min): 10 Minutes PT Missed Treatment Reason:  (elevated BP at rest)  Pain: Pain Assessment Pain Assessment: 0-10 Pain Score: 8  Pain Location: Head Pain Descriptors / Indicators: Aching Pain Intervention(s): RN made aware;Repositioned   See Function Navigator for Current Functional Status.   Therapy/Group: Individual Therapy  Waunita Schooner 11/24/2015, 8:53 AM

## 2015-11-24 NOTE — Progress Notes (Signed)
On my last round on this patient this morning, scant  blood was noted on his underwear. Pt was not aware that he is bleeding. On assessment, the blood was coming from his penis - bright red color. Linna Hoff, the PA was notified verbally in his office. He said that he will come and look at it this morning when they round.

## 2015-11-24 NOTE — Progress Notes (Signed)
Speech Language Pathology Daily Session Note  Patient Details  Name: Patrick [name] MRN: [id number] Date of Birth: [birth year]  Today's Date: 11/24/2015 SLP Individual Time: 1300-1400 SLP Individual Time Calculation (min): 60 min  Short Term Goals: Week 1: SLP Short Term Goal 1 (Week 1): Pt will recall basic, daily information with mod assist verbal cues for use of external aids.  SLP Short Term Goal 2 (Week 1): Pt will attend to basic, familiar tasks for 5-10 minute intervals with mod verbal cues for redirection.  SLP Short Term Goal 3 (Week 1): Pt will complete basic, familiar tasks with mod assist verbal cues for functional problem solving   Skilled Therapeutic Interventions: Skilled ST focused on cognitive goals. SLP facilitated session by providing max A verbal cues all activities within session. Pt was asleep with multiple family members present when SLP entered room. Pt able to arouse but kept eyes closed. Nursing in to check pt's BP before SLP raised head of bed. While waiting on nursing, pt able to independently answer orientation questions but required mav verbal cues for basic problem solving cards. Pt was unable to identify problem in picture card and unable to recall names of common objects in the card (such as comb). BP was 197/120 but pt commented that his headache had lessened to 2/10 pain. Pt able to tolerate head of bed being raised and increased participation as result. MoCA Blind was attempted but pt only able to complete first task secondary to request to use bedside commode. Pt unable to problem solve basic problem (#1) on MoCA blind.  Pt required max A verbal cues for sustained attention in mildly distracting environment. Pt left with nursing assistance on bedside commode. Continue with current plan of care.   Function:   Cognition Comprehension Comprehension assist level: Follows complex conversation/direction with extra time/assistive device  Expression   Expression assist  level: Expresses basic 75 - 89% of the time/requires cueing 10 - 24% of the time. Needs helper to occlude trach/needs to repeat words.  Social Interaction Social Interaction assist level: Interacts appropriately 25 - 49% of time - Needs frequent redirection.  Problem Solving Problem solving assist level: Solves basic 25 - 49% of the time - needs direction more than half the time to initiate, plan or complete simple activities  Memory Memory assist level: Recognizes or recalls 25 - 49% of the time/requires cueing 50 - 75% of the time    Pain    Therapy/Group: Individual Therapy  Rohini Jaroszewski Rutherford Nail 11/24/2015, 4:39 PM

## 2015-11-24 NOTE — Progress Notes (Signed)
ANTICOAGULATION CONSULT NOTE - Follow Up Consult  Pharmacy Consult for coumadin Indication: atrial fibrillation  No Known Allergies  Patient Measurements: Height: 5' 9.5" (176.5 cm) Weight: 161 lb 6 oz (73.2 kg) IBW/kg (Calculated) : 71.85 Heparin Dosing Weight:   Vital Signs: Temp: 97.8 F (36.6 C) (04/27 0626) Temp Source: Oral (04/27 0626) BP: 147/115 mmHg (04/27 0626) Pulse Rate: 110 (04/27 0626)  Labs:  Recent Labs  11/21/15 1602 11/22/15 0540 11/23/15 0515 11/24/15 0458  HGB  --  13.9 14.0  --   HCT  --  42.1 43.1  --   PLT  --  196 170  --   LABPROT  --  30.1* 33.8* 30.1*  INR  --  2.94* 3.42* 2.93*  CREATININE  --  2.37* 3.37*  --   CKTOTAL 67  --   --   --     Estimated Creatinine Clearance: 21.6 mL/min (by C-G formula based on Cr of 3.37).   Medications:  Scheduled:  . allopurinol  200 mg Oral Daily  . aspirin EC  81 mg Oral Daily  . cloNIDine  0.3 mg Oral TID  . diclofenac sodium  4 g Topical QID  . diltiazem  180 mg Oral Daily  . insulin aspart  0-20 Units Subcutaneous TID WC  . insulin aspart  0-5 Units Subcutaneous QHS  . polyethylene glycol  17 g Oral Daily  . Warfarin - Pharmacist Dosing Inpatient   Does not apply q1800   Infusions:  . sodium chloride 75 mL/hr at 11/23/15 1849    Assessment: 8y male with afib is currently on therapeutic coumadin.  INR down from 3.42 to 2.93.  Goal of Therapy:  INR 2-3 Monitor platelets by anticoagulation protocol: Yes   Plan:  - coumadin 2.5 mg po x1 - INR in am  Betzaida Cremeens, Tsz-Yin 11/24/2015,8:04 AM

## 2015-11-24 NOTE — Progress Notes (Signed)
Occupational Therapy Session Note  Patient Details  Name: Patrick [name] MRN: [id number] Date of Birth: Dec 27, 1945  Today's Date: 11/24/2015 OT Individual Time: 1051-1110 OT Individual Time Calculation (min): 19 min    Short Term Goals: Week 1:  OT Short Term Goal 1 (Week 1): Pt will complete UB bathing with supervison sitting unsupported EOM or EOB.   OT Short Term Goal 2 (Week 1): Pt will perform LB bathing sit to stand with mod assist 2 consecutive sessions. OT Short Term Goal 3 (Week 1): Pt will donn pullover shirt with supervison in unsupported sitting. OT Short Term Goal 4 (Week 1): Pt will complete stand pivot transfers with LRAD and mod assist to the 3:1 or shower bench.  OT Short Term Goal 5 (Week 1): Pt will tolerate 30 mins B/D session with no more than 2 rest breaks.   Skilled Therapeutic Interventions/Progress Updates:    Pt seen for brief OT session.  Son Patrick [name] in the room as well with pt supine in bed with eyes closed.  He aroused to verbal questioning but then closed his eyes stating he still had a headache.  Pain rated at 5/10 verbally with meds just given earlier by nursing.  Noted elevated BP at prior session with BP meds given approximately 20 mins before session.  BP taken in supine X 4 as folows:  172/24, 187/28, 216/129, 180/127.  Discussed elevated diastolic number greater than 120, which was the verbal parameter given by Dr. Letta Pate yesterday, with nursing.  Also contacted PA who agreed to let pt rest at this time.  Pt left in the bed with call button and phone in reach and son in room.  Pt missed 56 mins of OT session.    Therapy Documentation Precautions:  Precautions Precautions: Fall Precaution Comments: RLE hemiparesis Restrictions Weight Bearing Restrictions: No General: General OT Amount of Missed Time: 60 Minutes PT Missed Treatment Reason:  (elevated BP at rest) Vital Signs: Therapy Vitals Pulse Rate: 91 BP: (!) 187/128 mmHg Patient Position (if  appropriate): Lying Oxygen Therapy SpO2: 97 % O2 Device: Not Delivered Pain: Pain Assessment Pain Assessment: 0-10 Pain Score: 5  Pain Type: Acute pain Pain Location: Head Pain Descriptors / Indicators: Aching Pain Intervention(s): Repositioned ADL: See Function Navigator for Current Functional Status.   Therapy/Group: Individual Therapy  Aundria Bitterman OTR/L 11/24/2015, 11:32 AM

## 2015-11-25 ENCOUNTER — Inpatient Hospital Stay (HOSPITAL_COMMUNITY): Payer: Medicare Other | Admitting: Speech Pathology

## 2015-11-25 ENCOUNTER — Inpatient Hospital Stay (HOSPITAL_COMMUNITY): Payer: Medicare Other | Admitting: Occupational Therapy

## 2015-11-25 ENCOUNTER — Inpatient Hospital Stay (HOSPITAL_COMMUNITY): Payer: Medicare Other | Admitting: Physical Therapy

## 2015-11-25 LAB — GLUCOSE, CAPILLARY
GLUCOSE-CAPILLARY: 149 mg/dL — AB (ref 65–99)
GLUCOSE-CAPILLARY: 237 mg/dL — AB (ref 65–99)
Glucose-Capillary: 154 mg/dL — ABNORMAL HIGH (ref 65–99)
Glucose-Capillary: 170 mg/dL — ABNORMAL HIGH (ref 65–99)

## 2015-11-25 LAB — BASIC METABOLIC PANEL
ANION GAP: 16 — AB (ref 5–15)
BUN: 51 mg/dL — ABNORMAL HIGH (ref 6–20)
CHLORIDE: 97 mmol/L — AB (ref 101–111)
CO2: 19 mmol/L — AB (ref 22–32)
CREATININE: 3.19 mg/dL — AB (ref 0.61–1.24)
Calcium: 8.9 mg/dL (ref 8.9–10.3)
GFR calc Af Amer: 22 mL/min — ABNORMAL LOW (ref >60)
GFR calc non Af Amer: 19 mL/min — ABNORMAL LOW (ref >60)
GLUCOSE: 159 mg/dL — AB (ref 65–99)
POTASSIUM: 3.9 mmol/L (ref 3.5–5.1)
Sodium: 132 mmol/L — ABNORMAL LOW (ref 135–145)

## 2015-11-25 LAB — PROTIME-INR
INR: 2.77 — AB (ref 0.00–1.49)
Prothrombin Time: 28.8 seconds — ABNORMAL HIGH (ref 11.6–15.2)

## 2015-11-25 MED ORDER — DILTIAZEM HCL ER COATED BEADS 240 MG PO CP24
240.0000 mg | ORAL_CAPSULE | Freq: Every day | ORAL | Status: DC
  Administered 2015-11-25 – 2015-11-28 (×4): 240 mg via ORAL
  Filled 2015-11-25 (×5): qty 1

## 2015-11-25 MED ORDER — HYDRALAZINE HCL 25 MG PO TABS
25.0000 mg | ORAL_TABLET | Freq: Four times a day (QID) | ORAL | Status: DC | PRN
  Administered 2015-11-26: 25 mg via ORAL
  Filled 2015-11-25: qty 1

## 2015-11-25 MED ORDER — WARFARIN SODIUM 5 MG PO TABS
2.5000 mg | ORAL_TABLET | Freq: Once | ORAL | Status: AC
  Administered 2015-11-25: 2.5 mg via ORAL
  Filled 2015-11-25: qty 1

## 2015-11-25 NOTE — Plan of Care (Signed)
Problem: RH PAIN MANAGEMENT Goal: RH STG PAIN MANAGED AT OR BELOW PT'S PAIN GOAL Pain level 3/10 with min assist at discharge  Outcome: Progressing No c/o pain

## 2015-11-25 NOTE — Progress Notes (Signed)
Occupational Therapy Session Note  Patient Details  Name: Patrick [name] MRN: [id number] Date of Birth: 11-25-1945  Today's Date: 11/25/2015 OT Individual Time: 0902-1003 OT Individual Time Calculation (min): 61 min    Short Term Goals: Week 1:  OT Short Term Goal 1 (Week 1): Pt will complete UB bathing with supervison sitting unsupported EOM or EOB.   OT Short Term Goal 2 (Week 1): Pt will perform LB bathing sit to stand with mod assist 2 consecutive sessions. OT Short Term Goal 3 (Week 1): Pt will donn pullover shirt with supervison in unsupported sitting. OT Short Term Goal 4 (Week 1): Pt will complete stand pivot transfers with LRAD and mod assist to the 3:1 or shower bench.  OT Short Term Goal 5 (Week 1): Pt will tolerate 30 mins B/D session with no more than 2 rest breaks.   Skilled Therapeutic Interventions/Progress Updates:    Pt sitting at the sink finishing brushing his teeth with SLP during start of session.  Agreed to completing bathing and dressing during session.  Attempted transfer to the shower bench but pt falling backwards into the chair and completely fatigued after first attempt to stand.  Decided it would be safer to work on B/D at the sink based on this.  Pt needing max assist for sitting balance out of the back of the chair.  Demonstrates slightly more weakness in the RUE than noted on eval, but could still use functionally as an active assist with min facilitation for washing his left arm and part of his upper legs.  Max assist for sit to stand and standing while therapist assisted with washing his buttocks.  Pt needing rest breaks after each step of an activity during this session.  Therapist assisted with donning all LB clothing secondary to fatigue.  Pt transferred back to bed stand pivot with max assist at end of session.  Bed alarm in place and call button in pt's reach.    Therapy Documentation Precautions:  Precautions Precautions: Fall Precaution Comments: RLE  hemiparesis Restrictions Weight Bearing Restrictions: No  Pain: Pain Assessment Pain Assessment: No/denies pain ADL: See Function Navigator for Current Functional Status.   Therapy/Group: Individual Therapy  [name] OTR/L 11/25/2015, 12:34 PM

## 2015-11-25 NOTE — Progress Notes (Signed)
Social Work Assessment and Plan  Patient Details  Name: Patrick [name] MRN: [id number] Date of Birth: 12/27/1945  Today's Date: 11/23/2015  Problem List:  Patient Active Problem List   Diagnosis Date Noted  . Stroke due to embolism ([place]) 11/22/2015  . TIA (transient ischemic attack)   . AKI (acute kidney injury) ([place])   . Other vascular headache   . History of CVA with residual deficit   . Supratherapeutic INR   . Hypertensive emergency 11/18/2015  . Diastolic CHF, chronic ([place]) 11/18/2015  . Hypertensive encephalopathy 11/18/2015  . Hypertensive crisis 11/18/2015  . Headache 11/15/2015  . Thigh pain 11/15/2015  . Hematuria 08/30/2015  . Chronic anemia 03/31/2014  . Healthcare maintenance 03/31/2014  . Permanent atrial fibrillation ([place]) 07/17/2013  . Chronic anticoagulation, on coumadin 07/17/2013  . Tachy-brady syndrome ([place]) 07/17/2013  . Non compliance w medication regimen 06/25/2012  . Acute ischemic VBA thalamic stroke ([place]) 12/11/2010  . Chronic kidney disease (CKD), stage III (moderate) 12/21/2008  . CORONARY ARTERY DISEASE, non obstructive disease in 2005 with cath 07/19/2006  . Diabetes mellitus with severe nonproliferative retinopathy and macular edema 06/05/2006  . Hyperlipidemia 06/05/2006  . Gout 06/05/2006  . DEPRESSION 06/05/2006  . Uncontrolled hypertension 06/05/2006  . Sleep apnea 06/05/2006   Past Medical History:  Past Medical History  Diagnosis Date  . Hypertension   . CKD (chronic kidney disease), stage II   . CAD (coronary artery disease)   . Depression   . Gout   . Hyperlipidemia   . 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 GERD (gastroesophageal reflux disease)   . Diabetes mellitus type II     Insulin dependent  . Upper GI bleeding 07/01/2013  . Paroxysmal atrial fibrillation (HCC)     on coumadin  . Atrial fibrillation ([place])   . Myocardial infarction [facility]) 2000's    "near heart attack"  (07/14/2013)  . Shortness of breath     "can happen at any time" (07/14/2013)  . Sleep apnea 07/2010    "has mask at home; seldom uses it" (07/14/2013)  . Stomach ulcer 1950's    "as a teenager"  . Stroke [facility]) ~ 2007; ~ 2009    "memory not as good since" (07/14/2013)  . Permanent atrial fibrillation ([place]) 07/17/2013  . Chronic anticoagulation, on coumadin 07/17/2013  . Tachy-brady syndrome ([place]) 07/17/2013   Past Surgical History:  Past Surgical History  Procedure Laterality Date  . Cardiac catheterization  07/2003    /encounter notes 08/27/2005 (07/01/2013)   Social History:  reports that he quit smoking about 39 years ago. His smoking use included Cigarettes. He has a 7.5 pack-year smoking history. He has quit using smokeless tobacco. He reports that he drinks alcohol. He reports that he does not use illicit drugs.  Family / Support Systems Marital Status: Married Patient Roles: Spouse, Parent Spouse/Significant Other: Patrick [name] - wife - [phone] Children: 4 sons Anticipated Caregiver: Wife Ability/Limitations of Caregiver: Wife recently had a stroke, but is recovering.  She will likely only be able to provide supervision.  Sons do not feel they can provide 24/7 care. Caregiver Availability: Intermittent Family Dynamics: supportive family, but pt's wife is recovering from stroke and family is trying to help her and work.  Social History Preferred language: English Religion: Christian Read: Yes Write: Yes Employment Status: Retired Public relations account executive Issues: none reported Guardian/Conservator: Pt does not have a guardian.  MD has stated  that pt is not capable of making his own decisions currently and questions if pt has some baseline cognitive deficits.   Abuse/Neglect Physical Abuse: Denies Verbal Abuse: Denies Sexual Abuse: Denies Exploitation of patient/patient's resources: Denies Self-Neglect: Denies  Emotional Status Pt's affect, behavior and  adjustment status: Pt reported his mood is okay, but could not really elaborate. Recent Psychosocial Issues: Pt's wife with recent stroke.  Pt was not taking all of his medications may have led to his stroke. Psychiatric History: Pt with a hx of depression in the chart, but pt/family did not discuss this with CSW. Substance Abuse History: none reported  Patient / Family Perceptions, Expectations & Goals Pt/Family understanding of illness & functional limitations: Pt/family seem to have an understanding of pt's condition and what has happened. Premorbid pt/family roles/activities: Pt is retired from being a Administrator and working at [employer]  He spends time with his wife and sons now. Anticipated changes in roles/activities/participation: Pt would like to resume activities as he is able. Pt/family expectations/goals: Pt was not able to clearly state his rehab goal at the time of assessment visit.  Complained of headache.  Community [employer] Agencies: None Premorbid Home Care/DME Agencies: Other (Comment) (wife has a bedside commode) Transportation available at discharge: family Resource referrals recommended: Neuropsychology, Support group (specify)  Discharge Planning Living Arrangements: Spouse/significant other Support Systems: Spouse/significant other, Children, Church/faith community Type of Residence: Private residence Insurance Resources: [employer] Financial Resources: [place] Referred: No Living Expenses: Rent Money Management: Patient, Spouse Does the patient have any problems obtaining your medications?: Yes (Describe) Home Management: Pt's sons are doing this for them right now. Patient/Family Preliminary Plans: Pt's family is unsure what the plan will be for pt when he is d/c'd from CIR. Barriers to Discharge: Self care, Family Support Social Work Anticipated Follow Up Needs: HH/OP, Support Group Expected length of stay: 14 to 21  days  Clinical Impression CSW met with pt and his wife and 2 of his sons to introduce self and role of CSW, as well as to complete assessment.  Pt had a headache, so he was not very conversant with CSW.  CSW informed pt's RN after our visit that his head was hurting.  Pt's family is glad that pt could come to CIR, but pt's son is not sure what they are going to do at d/c.  Sons are trying to help pt's wife following her stroke now, but she is doing better than pt is and pt's goals are min assist overall, so someone would need to provide hands on assistance and it seems wife will not be able to do that currently and pt's sons may not be able to do 24/7.  CSW told family that we will continue to discuss this and for them to talk as a family.  CSW will continue to follow and assist as needed.  Patrick [name], Patrick [name] 11/24/2015, 2:17 AM

## 2015-11-25 NOTE — Progress Notes (Signed)
ANTICOAGULATION CONSULT NOTE - Follow Up Consult  Pharmacy Consult for coumadin Indication: atrial fibrillation  No Known Allergies  Patient Measurements: Height: 5' 9.5" (176.5 cm) Weight: 163 lb 5.8 oz (74.1 kg) IBW/kg (Calculated) : 71.85 Heparin Dosing Weight:   Vital Signs: Temp: 98.7 F (37.1 C) (04/28 0510) Temp Source: Oral (04/28 0510) BP: 147/132 mmHg (04/28 0510) Pulse Rate: 95 (04/28 0510)  Labs:  Recent Labs  11/23/15 0515 11/24/15 0458 11/24/15 1018 11/25/15 0456  HGB 14.0  --  13.4  --   HCT 43.1  --  41.4  --   PLT 170  --  186  --   LABPROT 33.8* 30.1*  --  28.8*  INR 3.42* 2.93*  --  2.77*  CREATININE 3.37*  --   --   --     Estimated Creatinine Clearance: 21.6 mL/min (by C-G formula based on Cr of 3.37).   Medications:  Scheduled:  . allopurinol  200 mg Oral Daily  . aspirin EC  81 mg Oral Daily  . cloNIDine  0.3 mg Oral TID  . diclofenac sodium  4 g Topical QID  . diltiazem  240 mg Oral Daily  . insulin aspart  0-20 Units Subcutaneous TID WC  . insulin aspart  0-5 Units Subcutaneous QHS  . polyethylene glycol  17 g Oral Daily  . Warfarin - Pharmacist Dosing Inpatient   Does not apply q1800   Infusions:  . sodium chloride 75 mL/hr at 11/24/15 1902    Assessment: 70 yo male with afib is currently on therapeutic coumadin.  INR today is 2.77 from 2.93 probably due to holding dose few days ago.  Goal of Therapy:  INR 2-3 Monitor platelets by anticoagulation protocol: Yes   Plan:  - Coumadin 2.5 mg po x1 - Daily PT / INR  Shemicka Cohrs, Tsz-Yin 11/25/2015,8:15 AM

## 2015-11-25 NOTE — Progress Notes (Signed)
Physical Therapy Session Note  Patient Details  Name: Patrick [name] MRN: [id number] Date of Birth: Dec 18, 1945  Today's Date: 11/25/2015 PT Individual Time: 1300-1315 PT Individual Time Calculation (min): 15 min   Short Term Goals: Week 1:  PT Short Term Goal 1 (Week 1): pt will perform supine> sit with min assist PT Short Term Goal 2 (Week 1): pt will transfer bed>< w/c wiht mod assist PT Short Term Goal 3 (Week 1): pt will propel w/c x 50' with min assist PT Short Term Goal 4 (Week 1): pt will perform gait x 15' with 2 helpers  Skilled Therapeutic Interventions/Progress Updates:   Patient in bed with HOB raised slumped over to L rail. Patient repositioned in bed with max A but then declined to participate in further therapy with no c/o pain despite education on benefits of therapy. Seated BP assessed in bed, see below. RN notified and patient missed 60 min skilled physical therapy, will f/u as able per POC.      Therapy Documentation Precautions:  Precautions Precautions: Fall Precaution Comments: RLE hemiparesis Restrictions Weight Bearing Restrictions: No General: PT Amount of Missed Time (min): 60 Minutes PT Missed Treatment Reason: Patient unwilling to participate;Other (Comment) (refusal, elevated BP at rest) Vital Signs: Therapy Vitals Pulse Rate: 98 BP: (!) 186/125 mmHg Patient Position (if appropriate): Sitting Oxygen Therapy SpO2: 100 % O2 Device: Not Delivered Pain: Pain Assessment Pain Assessment: No/denies pain   See Function Navigator for Current Functional Status.   Therapy/Group: Individual Therapy  Laretta Alstrom 11/25/2015, 1:18 PM

## 2015-11-25 NOTE — Progress Notes (Signed)
Speech Language Pathology Daily Session Note  Patient Details  Name: Patrick [name] MRN: [id number] Date of Birth: [birth year]  Today's Date: 11/25/2015 SLP Individual Time: 0804-0900 SLP Individual Time Calculation (min): 56 min  Short Term Goals: Week 1: SLP Short Term Goal 1 (Week 1): Pt will recall basic, daily information with mod assist verbal cues for use of external aids.  SLP Short Term Goal 2 (Week 1): Pt will attend to basic, familiar tasks for 5-10 minute intervals with mod verbal cues for redirection.  SLP Short Term Goal 3 (Week 1): Pt will complete basic, familiar tasks with mod assist verbal cues for functional problem solving   Skilled Therapeutic Interventions:  Pt was seen for skilled ST targeting cognitive goals.  Pt was able to sustain his attention to a functional meal for 15 minute intervals with min assist verbal cues for redirection and initiation.  Min assist verbal cues also needed for problem solving of tray set up and asking for assistance when appropriate.  Following completion of meal, pt was transferred to wheelchair with assistance from nurse tech.  Pt was able to complete oral care with mod-max assist verbal cues to recognize errors and generate alternative solutions to problems.  Pt was left sitting by sink, handed off to OT.  Continue per current plan of care.    Function:  Eating Eating     Eating Assist Level: Set up assist for;More than reasonable amount of time   Eating Set Up Assist For: Opening containers       Cognition Comprehension Comprehension assist level: Follows basic conversation/direction with extra time/assistive device  Expression   Expression assist level: Expresses basic 90% of the time/requires cueing < 10% of the time.  Social Interaction Social Interaction assist level: Interacts appropriately 75 - 89% of the time - Needs redirection for appropriate language or to initiate interaction.  Problem Solving Problem solving assist level:  Solves basic 50 - 74% of the time/requires cueing 25 - 49% of the time  Memory Memory assist level: Recognizes or recalls 25 - 49% of the time/requires cueing 50 - 75% of the time    Pain Pain Assessment Pain Assessment: No/denies pain  Therapy/Group: Individual Therapy  Jennifier Smitherman, Selinda Orion 11/25/2015, 12:28 PM

## 2015-11-25 NOTE — IPOC Note (Signed)
Overall Plan of Care [facility]) Patient Details Name: Patrick [name] MRN: [id number] DOB: 1945/08/20  Admitting Diagnosis: B CVA  Hospital Problems: Principal Problem:   Stroke due to embolism [facility]) Active Problems:   Uncontrolled hypertension   Chronic kidney disease (CKD), stage III (moderate)   Permanent atrial fibrillation (HCC)   Diastolic CHF, chronic (HCC)   Hypertensive encephalopathy     Functional Problem List: Nursing Endurance, Medication Management, Motor, Pain, Perception, Safety, Bladder  PT Balance, Endurance, Motor, Safety, Pain  OT Balance, Motor, Cognition, Endurance, Pain, Safety  SLP Cognition  TR         Basic ADL's: OT Grooming, Bathing, Dressing, Toileting     Advanced  ADL's: OT       Transfers: PT Bed Mobility, Bed to Chair, Car, Manufacturing systems engineer, Metallurgist: PT Ambulation, Emergency planning/management officer, Stairs     Additional Impairments: OT Fuctional Use of Upper Extremity  SLP Social Cognition   Memory, Attention  TR      Anticipated Outcomes Item Anticipated Outcome  Self Feeding independent  Swallowing      Basic self-care  supervision to min assist  Toileting  min assist   Bathroom Transfers min assist  Bowel/Bladder  manage bowel and bladder min assist  Transfers  supervision  Locomotion  supervision w/c x 150'; gait TBD  Communication     Cognition  min assist for basic   Pain  4 or less  Safety/Judgment  min assist   Therapy Plan: PT Intensity: Minimum of 1-2 x/day ,45 to 90 minutes PT Frequency: 5 out of 7 days, Total of 15 hours over 7 days of combined therapies PT Duration Estimated Length of Stay: 14-21 OT Intensity: Minimum of 1-2 x/day, 45 to 90 minutes OT Frequency: 5 out of 7 days OT Duration/Estimated Length of Stay: 17-19 days SLP Intensity: Minumum of 1-2 x/day, 30 to 90 minutes SLP Frequency: 3 to 5 out of 7 days SLP Duration/Estimated Length of Stay: 17-19  days        Team  Interventions: Nursing Interventions Patient/Family Education, Disease Management/Prevention, Pain Management, Medication Management, Cognitive Remediation/Compensation, Discharge Planning, Bladder Management  PT interventions Ambulation/gait training, Balance/vestibular training, Community reintegration, Cognitive remediation/compensation, Discharge planning, Functional electrical stimulation, DME/adaptive equipment instruction, Functional mobility training, Neuromuscular re-education, Patient/family education, Pain management, Psychosocial support, Stair training, Splinting/orthotics, Therapeutic Activities, Therapeutic Exercise, UE/LE Coordination activities, UE/LE Strength taining/ROM, Wheelchair propulsion/positioning  OT Interventions Training and development officer, Engineer, drilling, Patient/family education, Discharge planning, Functional mobility training, Community reintegration, Cognitive remediation/compensation, Disease mangement/prevention, Neuromuscular re-education, Self Care/advanced ADL retraining, Therapeutic Exercise, UE/LE Strength taining/ROM, Pain management, UE/LE Coordination activities, Therapeutic Activities, Psychosocial support  SLP Interventions Cognitive remediation/compensation, Cueing hierarchy, Functional tasks, Environmental controls, Internal/external aids, Patient/family education  TR Interventions    SW/CM Interventions Discharge Planning, Psychosocial Support, Patient/Family Education    Team Discharge Planning: Destination: PT-Home ,OT- Home , SLP-Home Projected Follow-up: PT-Home health PT, OT-  Home health OT, SLP-Other (comment) (TBD pending progress made while inpatient ) Projected Equipment Needs: PT-To be determined, OT- To be determined, SLP-None recommended by SLP Equipment Details: PT- , OT-  Patient/family involved in discharge planning: PT- Patient, Family member/caregiver,  OT-Patient, SLP-Patient, Family member/caregiver  MD ELOS:  19-20 days Medical Rehab Prognosis:  Excellent Assessment: The patient has been admitted for CIR therapies with the diagnosis of bilateral CVA's. The team will be addressing functional mobility, strength, stamina, balance, safety, adaptive techniques and equipment, self-care, bowel and bladder mgt, patient and  caregiver education, NMR, cognitive perceptual rx, visual-perceptual rx, communication, cognition, swallowing, ego support, community reintegration. Goals have been set at min assist to supervision for basic mobility and self-care/ADL's and min assist for cognition and safety awareness   Meredith Staggers, MD, [facility]      See Team Conference Notes for weekly updates to the plan of care

## 2015-11-25 NOTE — Progress Notes (Signed)
Inpatient [facility] Individual Statement of Services  Patient Name:  Patrick [name]  Date:  11/25/2015  Welcome to the [facility].  Our goal is to provide you with an individualized program based on your diagnosis and situation, designed to meet your specific needs.  With this comprehensive rehabilitation program, you will be expected to participate in at least 3 hours of rehabilitation therapies Monday-Friday, with modified therapy programming on the weekends.  Your rehabilitation program will include the following services:  Physical Therapy (PT), Occupational Therapy (OT), Speech Therapy (ST), 24 hour per day rehabilitation nursing, Neuropsychology, Case Management (Social Worker), Rehabilitation Medicine, Nutrition Services and Pharmacy Services  Weekly team conferences will be held on Wednesdays to discuss your progress.  Your Social Worker will talk with you frequently to get your input and to update you on team discussions.  Team conferences with you and your family in attendance may also be held.  Expected length of stay: 2 to 3 weeks Overall anticipated outcome: Minimal Assistance with some supervision goals  Depending on your progress and recovery, your program may change. Your Social Worker will coordinate services and will keep you informed of any changes. Your Social Worker's name and contact numbers are listed  below.  The following services may also be recommended but are not provided by the [facility] will be made to provide these services after discharge if needed.  Arrangements include referral to agencies that provide these services.  Your insurance has been verified to be:  Medicare Your primary doctor is:  Dr. Jacques Earthly  Pertinent information will be shared with your doctor and your insurance  company.  Social Worker:  Alfonse Alpers, LCSW  [phone] or [phone]  Information discussed with and copy given to patient by: Trey Sailors, 11/25/2015, 2:59 AM

## 2015-11-25 NOTE — Progress Notes (Signed)
70 y.o. male with history of CKD, HTN, A fib, DMT2, prior CVAs with cognitive deficits who was admitted on 11/18/15 with hypertensive emergency, HA, weakness and minor right sided weakness. He was admitted due to elevated BP and concerns of PRES. Patient had missed multiple BP doses due to wife being in the hospital. Work up showed acute on chronic renal insufficiency as well as INR -1.97. He was started on cardene drip and home medications resumed. 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 MRI brain negative. Carotid dopplers without significant ICA stenosis. 2 D echo with EF 40-45% and no wall abnormality. Work up negative and neurology has signed off. He had decline in mobility with inability to stand, RLE stability due to worsening of RLE weakness yesterday. Repeat MRI brain done showing interval development of scattered nonhemorrhagic infarcts medial aspect of posterior frontal and parietal lobes bilaterally, remote left thalamic infarcts, prior hemorrhagic infarct right cerebellar and posterior right temporal-occipital junction and global atrophy  Subjective/Complaints: HA improving  No visible blood in last urine sample    ROS- denies any CP/SOB, N/V/D, +HA  Objective: Vital Signs: Blood pressure 147/132, pulse 95, temperature 98.7 F (37.1 C), temperature source Oral, resp. rate 19, height 5' 9.5" (1.765 m), weight 74.1 kg (163 lb 5.8 oz), SpO2 100 %. No results found. Results for orders placed or performed during the hospital encounter of 11/22/15 (from the past 72 hour(s))  Glucose, capillary     Status: Abnormal   Collection Time: 11/22/15  5:24 PM  Result Value Ref Range   Glucose-Capillary 195 (H) 65 - 99 mg/dL  Glucose, capillary     Status: Abnormal   Collection Time: 11/22/15  8:58 PM  Result Value Ref Range   Glucose-Capillary 258 (H) 65 - 99 mg/dL  CBC WITH DIFFERENTIAL     Status: Abnormal   Collection Time: 11/23/15  5:15 AM  Result Value Ref Range   WBC 7.2 4.0  - 10.5 K/uL   RBC 5.56 4.22 - 5.81 MIL/uL   Hemoglobin 14.0 13.0 - 17.0 g/dL   HCT 43.1 39.0 - 52.0 %   MCV 77.5 (L) 78.0 - 100.0 fL   MCH 25.2 (L) 26.0 - 34.0 pg   MCHC 32.5 30.0 - 36.0 g/dL   RDW 14.5 11.5 - 15.5 %   Platelets 170 150 - 400 K/uL   Neutrophils Relative % 73 %   Neutro Abs 5.2 1.7 - 7.7 K/uL   Lymphocytes Relative 15 %   Lymphs Abs 1.1 0.7 - 4.0 K/uL   Monocytes Relative 12 %   Monocytes Absolute 0.9 0.1 - 1.0 K/uL   Eosinophils Relative 1 %   Eosinophils Absolute 0.1 0.0 - 0.7 K/uL   Basophils Relative 0 %   Basophils Absolute 0.0 0.0 - 0.1 K/uL  Comprehensive metabolic panel     Status: Abnormal   Collection Time: 11/23/15  5:15 AM  Result Value Ref Range   Sodium 134 (L) 135 - 145 mmol/L   Potassium 3.5 3.5 - 5.1 mmol/L   Chloride 99 (L) 101 - 111 mmol/L   CO2 23 22 - 32 mmol/L   Glucose, Bld 131 (H) 65 - 99 mg/dL   BUN 42 (H) 6 - 20 mg/dL   Creatinine, Ser 3.37 (H) 0.61 - 1.24 mg/dL   Calcium 9.1 8.9 - 10.3 mg/dL   Total Protein 6.2 (L) 6.5 - 8.1 g/dL   Albumin 3.2 (L) 3.5 - 5.0 g/dL   AST 26 15 - 41 U/L  ALT 25 17 - 63 U/L   Alkaline Phosphatase 70 38 - 126 U/L   Total Bilirubin 0.9 0.3 - 1.2 mg/dL   GFR calc non Af Amer 17 (L) >60 mL/min   GFR calc Af Amer 20 (L) >60 mL/min    Comment: (NOTE) The eGFR has been calculated using the CKD EPI equation. This calculation has not been validated in all clinical situations. eGFR's persistently <60 mL/min signify possible Chronic Kidney Disease.    Anion gap 12 5 - 15  Protime-INR     Status: Abnormal   Collection Time: 11/23/15  5:15 AM  Result Value Ref Range   Prothrombin Time 33.8 (H) 11.6 - 15.2 seconds   INR 3.42 (H) 0.00 - 1.49  Glucose, capillary     Status: Abnormal   Collection Time: 11/23/15  6:37 AM  Result Value Ref Range   Glucose-Capillary 125 (H) 65 - 99 mg/dL  Glucose, capillary     Status: Abnormal   Collection Time: 11/23/15 11:45 AM  Result Value Ref Range   Glucose-Capillary  209 (H) 65 - 99 mg/dL  Glucose, capillary     Status: Abnormal   Collection Time: 11/23/15  5:07 PM  Result Value Ref Range   Glucose-Capillary 121 (H) 65 - 99 mg/dL  Glucose, capillary     Status: Abnormal   Collection Time: 11/23/15  8:47 PM  Result Value Ref Range   Glucose-Capillary 222 (H) 65 - 99 mg/dL  Protime-INR     Status: Abnormal   Collection Time: 11/24/15  4:58 AM  Result Value Ref Range   Prothrombin Time 30.1 (H) 11.6 - 15.2 seconds   INR 2.93 (H) 0.00 - 1.49  Glucose, capillary     Status: Abnormal   Collection Time: 11/24/15  6:50 AM  Result Value Ref Range   Glucose-Capillary 175 (H) 65 - 99 mg/dL  CBC     Status: Abnormal   Collection Time: 11/24/15 10:18 AM  Result Value Ref Range   WBC 8.0 4.0 - 10.5 K/uL   RBC 5.29 4.22 - 5.81 MIL/uL   Hemoglobin 13.4 13.0 - 17.0 g/dL   HCT 41.4 39.0 - 52.0 %   MCV 78.3 78.0 - 100.0 fL   MCH 25.3 (L) 26.0 - 34.0 pg   MCHC 32.4 30.0 - 36.0 g/dL   RDW 14.4 11.5 - 15.5 %   Platelets 186 150 - 400 K/uL    Comment: SPECIMEN CHECKED FOR CLOTS REPEATED TO VERIFY PLATELET COUNT CONFIRMED BY SMEAR   Glucose, capillary     Status: Abnormal   Collection Time: 11/24/15 12:05 PM  Result Value Ref Range   Glucose-Capillary 210 (H) 65 - 99 mg/dL   Comment 1 Notify RN   Glucose, capillary     Status: Abnormal   Collection Time: 11/24/15  4:44 PM  Result Value Ref Range   Glucose-Capillary 173 (H) 65 - 99 mg/dL   Comment 1 Notify RN   Urinalysis, Routine w reflex microscopic (not at [facility])     Status: Abnormal   Collection Time: 11/24/15  7:07 PM  Result Value Ref Range   Color, Urine YELLOW YELLOW   APPearance CLEAR CLEAR   Specific Gravity, Urine 1.019 1.005 - 1.030   pH 5.5 5.0 - 8.0   Glucose, UA 100 (A) NEGATIVE mg/dL   Hgb urine dipstick LARGE (A) NEGATIVE   Bilirubin Urine NEGATIVE NEGATIVE   Ketones, ur NEGATIVE NEGATIVE mg/dL   Protein, ur >300 (A) NEGATIVE mg/dL  Nitrite NEGATIVE NEGATIVE   Leukocytes, UA  NEGATIVE NEGATIVE  Urine microscopic-add on     Status: Abnormal   Collection Time: 11/24/15  7:07 PM  Result Value Ref Range   Squamous Epithelial / LPF 0-5 (A) NONE SEEN   WBC, UA 0-5 0 - 5 WBC/hpf   RBC / HPF 6-30 0 - 5 RBC/hpf   Bacteria, UA FEW (A) NONE SEEN  Glucose, capillary     Status: Abnormal   Collection Time: 11/24/15  8:46 PM  Result Value Ref Range   Glucose-Capillary 226 (H) 65 - 99 mg/dL  Protime-INR     Status: Abnormal   Collection Time: 11/25/15  4:56 AM  Result Value Ref Range   Prothrombin Time 28.8 (H) 11.6 - 15.2 seconds   INR 2.77 (H) 0.00 - 1.49     HEENT: normal Cardio: RRR and no murmur Resp: CTA B/L and unlabored GI: BS positive and NT, ND GU:  sm amt meatal blood mixed with urine Extremity:  Pulses positive and No Edema Skin:   Intact Neuro: Alert/Oriented, Normal Sensory, Abnormal Motor 4/5 in RUE , 2- RLE, 5/5 on L side, Abnormal FMC Ataxic/ dec FMC and Dysarthric Musc/Skel:  Other no pain with UE or LE ROM, no joint swelling Gen NAD   Assessment/Plan: 1. Functional deficits secondary to bilateral posterior frontal and parietal infarct with R LE> UE paresis which require 3+ hours per day of interdisciplinary therapy in a comprehensive inpatient rehab setting. Physiatrist is providing close team supervision and 24 hour management of active medical problems listed below. Physiatrist and rehab team continue to assess barriers to discharge/monitor patient progress toward functional and medical goals. FIM: Function - Bathing Position: Sitting EOB Body parts bathed by patient: Right arm, Left arm, Chest, Abdomen, Right upper leg, Left upper leg Body parts bathed by helper: Back, Left lower leg, Right lower leg, Front perineal area, Buttocks Assist Level: 2 helpers  Function- Upper Body Dressing/Undressing What is the patient wearing?: Pull over shirt/dress Pull over shirt/dress - Perfomed by patient: Thread/unthread right sleeve, Thread/unthread  left sleeve, Put head through opening Pull over shirt/dress - Perfomed by helper: Pull shirt over trunk Function - Lower Body Dressing/Undressing What is the patient wearing?: Underwear, Pants, Ted Hose, Non-skid slipper socks Underwear - Performed by patient: Thread/unthread left underwear leg Underwear - Performed by helper: Thread/unthread right underwear leg, Thread/unthread left underwear leg Pants- Performed by patient: Thread/unthread left pants leg Pants- Performed by helper: Thread/unthread right pants leg, Pull pants up/down, Fasten/unfasten pants Non-skid slipper socks- Performed by helper: Don/doff right sock, Don/doff left sock TED Hose - Performed by helper: Don/doff right TED hose, Don/doff left TED hose Assist for lower body dressing: 2 Helpers     Function - Air cabin crew transfer assistive device: Bedside commode Assist level to bedside commode (at bedside): 2 helpers Assist level from bedside commode (at bedside): 2 helpers  Function - Chair/bed transfer Chair/bed transfer method: Stand pivot Chair/bed transfer assist level: Maximal assist (Pt 25 - 49%/lift and lower) Chair/bed transfer assistive device: Armrests Chair/bed transfer details: Verbal cues for technique, Verbal cues for precautions/safety, Verbal cues for sequencing, Manual facilitation for placement, Manual facilitation for weight bearing, Manual facilitation for weight shifting  Function - Locomotion: Wheelchair Will patient use wheelchair at discharge?: Yes Type: Manual Max wheelchair distance: 10 (extremely slowly) Assist Level: Moderate assistance (Pt 50 - 74%) Wheel 50 feet with 2 turns activity did not occur: Safety/medical concerns (lethargy,fatigue) Wheel 150 feet activity did  not occur: Safety/medical concerns (lethargy, fatigue) Turns around,maneuvers to table,bed, and toilet,negotiates 3% grade,maneuvers on rugs and over doorsills: No Function - Locomotion: Ambulation Ambulation  activity did not occur: Safety/medical concerns (unsafe due to knee buckling)  Function - Comprehension Comprehension: Auditory Comprehension assist level: Follows complex conversation/direction with extra time/assistive device  Function - Expression Expression: Verbal Expression assist level: Expresses basic 75 - 89% of the time/requires cueing 10 - 24% of the time. Needs helper to occlude trach/needs to repeat words.  Function - Social Interaction Social Interaction assist level: Interacts appropriately less than 25% of the time. May be withdrawn or combative.  Function - Problem Solving Problem solving assist level: Solves basic 25 - 49% of the time - needs direction more than half the time to initiate, plan or complete simple activities  Function - Memory Memory assist level: Recognizes or recalls 25 - 49% of the time/requires cueing 50 - 75% of the time Patient normally able to recall (first 3 days only): Current season, Staff names and faces, That he or she is in a hospital  Medical Problem List and Plan: 1.  Mobilty,cognitive and functional deficits secondary to bi-cerebral embolic CVA              -Cont inpatient rehab- theraist to notify RN if SBP >220 or DBP >120 so Hydralazine can be given and therapy resumed  2.  DVT Prophylaxis/Anticoagulation: Pharmaceutical: Coumadin              -pt supratherapeutic now with microscopic hematuria 3. Pain Management:  T#3 prn for headache, d/c tramadol, if not better as BP normalizes will trial topamax       4. Mood: team to provide ego support 5. Neuropsych: This patient is not capable of making decisions on his own behalf--question of mild cognitive deficits at baseline.   6. Skin/Wound Care: routine pressure relief measures. Add supplements between meals.   7. Fluids/Electrolytes/Nutrition:  Monitor I/O. Check lytes in am. 8. HTN: permissive hypertension with prn hydralazine (increase from 94m to 242m only for SBP >220 or DBP  >120.             -holding  catapres, hctz, lisinopril restarted catapres .23m55mID, increase calanSR to 240  Filed Vitals:   11/24/15 2359 11/25/15 0510  BP: 185/124 147/132  Pulse: 84 95  Temp: 98.2 F (36.8 C) 98.7 F (37.1 C)  Resp: 19 19   9. A Fib: cardizem/warfarin. Mild RVR  Increase calan- ? Pain related Monitor HR with exertion 10 Hyponatremia: mild. Encourage appropriate nutrition 11. CKD: Cr 3.37 today.Initiate IVF Follow i's and o's, repeat BMET today if further increase will need nephro eval 12. H/o gout: allopurinol 13. Gastric ulcers/GIB:  Protonix, monitor any bleeding while on warfarin             -hgb 13.9 14.  Hematuria without dysuria,  UA neg, C and S pending  LOS (Days) 3 A FACE TO FACE EVALUATION WAS PERFORMED  KIRSTEINS,ANDREW E 11/25/2015, 7:42 AM

## 2015-11-26 ENCOUNTER — Inpatient Hospital Stay (HOSPITAL_COMMUNITY): Payer: Medicare Other | Admitting: Physical Therapy

## 2015-11-26 ENCOUNTER — Inpatient Hospital Stay (HOSPITAL_COMMUNITY): Payer: Medicare Other | Admitting: Occupational Therapy

## 2015-11-26 ENCOUNTER — Inpatient Hospital Stay (HOSPITAL_COMMUNITY): Payer: Medicare Other | Admitting: Speech Pathology

## 2015-11-26 LAB — GLUCOSE, CAPILLARY
GLUCOSE-CAPILLARY: 166 mg/dL — AB (ref 65–99)
GLUCOSE-CAPILLARY: 209 mg/dL — AB (ref 65–99)
Glucose-Capillary: 172 mg/dL — ABNORMAL HIGH (ref 65–99)
Glucose-Capillary: 210 mg/dL — ABNORMAL HIGH (ref 65–99)

## 2015-11-26 LAB — URINE CULTURE

## 2015-11-26 LAB — PROTIME-INR
INR: 2.71 — AB (ref 0.00–1.49)
Prothrombin Time: 28.4 seconds — ABNORMAL HIGH (ref 11.6–15.2)

## 2015-11-26 MED ORDER — NEPRO/CARBSTEADY PO LIQD
237.0000 mL | Freq: Three times a day (TID) | ORAL | Status: DC
  Administered 2015-11-26 – 2015-12-09 (×15): 237 mL via ORAL

## 2015-11-26 MED ORDER — WARFARIN SODIUM 5 MG PO TABS
2.5000 mg | ORAL_TABLET | Freq: Once | ORAL | Status: AC
  Administered 2015-11-26: 2.5 mg via ORAL
  Filled 2015-11-26: qty 1

## 2015-11-26 NOTE — Progress Notes (Signed)
70 y.o. male with history of CKD, HTN, A fib, DMT2, prior CVAs with cognitive deficits who was admitted on 11/18/15 with hypertensive emergency, HA, weakness and minor right sided weakness. He was admitted due to elevated BP and concerns of PRES. Patient had missed multiple BP doses due to wife being in the hospital. Work up showed acute on chronic renal insufficiency as well as INR -1.97. He was started on cardene drip and home medications resumed. 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 MRI brain negative. Carotid dopplers without significant ICA stenosis. 2 D echo with EF 40-45% and no wall abnormality. Work up negative and neurology has signed off. He had decline in mobility with inability to stand, RLE stability due to worsening of RLE weakness yesterday. Repeat MRI brain done showing interval development of scattered nonhemorrhagic infarcts medial aspect of posterior frontal and parietal lobes bilaterally, remote left thalamic infarcts, prior hemorrhagic infarct right cerebellar and posterior right temporal-occipital junction and global atrophy  Subjective/Complaints: HA improving Feels tired with poor appetite but denies depression No visible blood in last urine sample    ROS- denies any CP/SOB, N/V/D, +HA  Objective: Vital Signs: Blood pressure 180/90, pulse 61, temperature 98.2 F (36.8 C), temperature source Oral, resp. rate 18, height 5' 9.5" (1.765 m), weight 74.707 kg (164 lb 11.2 oz), SpO2 100 %. No results found. Results for orders placed or performed during the hospital encounter of 11/22/15 (from the past 72 hour(s))  Glucose, capillary     Status: Abnormal   Collection Time: 11/23/15 11:45 AM  Result Value Ref Range   Glucose-Capillary 209 (H) 65 - 99 mg/dL  Glucose, capillary     Status: Abnormal   Collection Time: 11/23/15  5:07 PM  Result Value Ref Range   Glucose-Capillary 121 (H) 65 - 99 mg/dL  Glucose, capillary     Status: Abnormal   Collection Time:  11/23/15  8:47 PM  Result Value Ref Range   Glucose-Capillary 222 (H) 65 - 99 mg/dL  Protime-INR     Status: Abnormal   Collection Time: 11/24/15  4:58 AM  Result Value Ref Range   Prothrombin Time 30.1 (H) 11.6 - 15.2 seconds   INR 2.93 (H) 0.00 - 1.49  Glucose, capillary     Status: Abnormal   Collection Time: 11/24/15  6:50 AM  Result Value Ref Range   Glucose-Capillary 175 (H) 65 - 99 mg/dL  CBC     Status: Abnormal   Collection Time: 11/24/15 10:18 AM  Result Value Ref Range   WBC 8.0 4.0 - 10.5 K/uL   RBC 5.29 4.22 - 5.81 MIL/uL   Hemoglobin 13.4 13.0 - 17.0 g/dL   HCT 41.4 39.0 - 52.0 %   MCV 78.3 78.0 - 100.0 fL   MCH 25.3 (L) 26.0 - 34.0 pg   MCHC 32.4 30.0 - 36.0 g/dL   RDW 14.4 11.5 - 15.5 %   Platelets 186 150 - 400 K/uL    Comment: SPECIMEN CHECKED FOR CLOTS REPEATED TO VERIFY PLATELET COUNT CONFIRMED BY SMEAR   Glucose, capillary     Status: Abnormal   Collection Time: 11/24/15 12:05 PM  Result Value Ref Range   Glucose-Capillary 210 (H) 65 - 99 mg/dL   Comment 1 Notify RN   Glucose, capillary     Status: Abnormal   Collection Time: 11/24/15  4:44 PM  Result Value Ref Range   Glucose-Capillary 173 (H) 65 - 99 mg/dL   Comment 1 Notify RN   Urinalysis,  Routine w reflex microscopic (not at [facility])     Status: Abnormal   Collection Time: 11/24/15  7:07 PM  Result Value Ref Range   Color, Urine YELLOW YELLOW   APPearance CLEAR CLEAR   Specific Gravity, Urine 1.019 1.005 - 1.030   pH 5.5 5.0 - 8.0   Glucose, UA 100 (A) NEGATIVE mg/dL   Hgb urine dipstick LARGE (A) NEGATIVE   Bilirubin Urine NEGATIVE NEGATIVE   Ketones, ur NEGATIVE NEGATIVE mg/dL   Protein, ur >300 (A) NEGATIVE mg/dL   Nitrite NEGATIVE NEGATIVE   Leukocytes, UA NEGATIVE NEGATIVE  Urine microscopic-add on     Status: Abnormal   Collection Time: 11/24/15  7:07 PM  Result Value Ref Range   Squamous Epithelial / LPF 0-5 (A) NONE SEEN   WBC, UA 0-5 0 - 5 WBC/hpf   RBC / HPF 6-30 0 - 5 RBC/hpf    Bacteria, UA FEW (A) NONE SEEN  Urine culture     Status: None (Preliminary result)   Collection Time: 11/24/15  7:08 PM  Result Value Ref Range   Specimen Description URINE, CLEAN CATCH    Special Requests NONE    Culture TOO YOUNG TO READ    Report Status PENDING   Glucose, capillary     Status: Abnormal   Collection Time: 11/24/15  8:46 PM  Result Value Ref Range   Glucose-Capillary 226 (H) 65 - 99 mg/dL  Protime-INR     Status: Abnormal   Collection Time: 11/25/15  4:56 AM  Result Value Ref Range   Prothrombin Time 28.8 (H) 11.6 - 15.2 seconds   INR 2.77 (H) 0.00 - 1.49  Glucose, capillary     Status: Abnormal   Collection Time: 11/25/15  6:35 AM  Result Value Ref Range   Glucose-Capillary 149 (H) 65 - 99 mg/dL  Basic metabolic panel     Status: Abnormal   Collection Time: 11/25/15  7:23 AM  Result Value Ref Range   Sodium 132 (L) 135 - 145 mmol/L   Potassium 3.9 3.5 - 5.1 mmol/L   Chloride 97 (L) 101 - 111 mmol/L   CO2 19 (L) 22 - 32 mmol/L   Glucose, Bld 159 (H) 65 - 99 mg/dL   BUN 51 (H) 6 - 20 mg/dL   Creatinine, Ser 3.19 (H) 0.61 - 1.24 mg/dL   Calcium 8.9 8.9 - 10.3 mg/dL   GFR calc non Af Amer 19 (L) >60 mL/min   GFR calc Af Amer 22 (L) >60 mL/min    Comment: (NOTE) The eGFR has been calculated using the CKD EPI equation. This calculation has not been validated in all clinical situations. eGFR's persistently <60 mL/min signify possible Chronic Kidney Disease.    Anion gap 16 (H) 5 - 15  Glucose, capillary     Status: Abnormal   Collection Time: 11/25/15 11:33 AM  Result Value Ref Range   Glucose-Capillary 237 (H) 65 - 99 mg/dL   Comment 1 Notify RN   Glucose, capillary     Status: Abnormal   Collection Time: 11/25/15  4:41 PM  Result Value Ref Range   Glucose-Capillary 154 (H) 65 - 99 mg/dL   Comment 1 Notify RN   Glucose, capillary     Status: Abnormal   Collection Time: 11/25/15  9:21 PM  Result Value Ref Range   Glucose-Capillary 170 (H) 65 - 99  mg/dL  Protime-INR     Status: Abnormal   Collection Time: 11/26/15  4:48 AM  Result  Value Ref Range   Prothrombin Time 28.4 (H) 11.6 - 15.2 seconds   INR 2.71 (H) 0.00 - 1.49  Glucose, capillary     Status: Abnormal   Collection Time: 11/26/15  6:45 AM  Result Value Ref Range   Glucose-Capillary 166 (H) 65 - 99 mg/dL     HEENT: normal Cardio: RRR and no murmur Resp: CTA B/L and unlabored GI: BS positive and NT, ND GU:  sm amt meatal blood mixed with urine Extremity:  Pulses positive and No Edema Skin:   Intact Neuro: Alert/Oriented, Normal Sensory, Abnormal Motor 4/5 in RUE , 2- RLE, 5/5 on L side, Abnormal FMC Ataxic/ dec FMC and Dysarthric Musc/Skel:  Other no pain with UE or LE ROM, no joint swelling Gen NAD   Assessment/Plan: 1. Functional deficits secondary to bilateral posterior frontal and parietal infarct with R LE> UE paresis which require 3+ hours per day of interdisciplinary therapy in a comprehensive inpatient rehab setting. Physiatrist is providing close team supervision and 24 hour management of active medical problems listed below. Physiatrist and rehab team continue to assess barriers to discharge/monitor patient progress toward functional and medical goals. FIM: Function - Bathing Position: Wheelchair/chair at sink Body parts bathed by patient: Right arm, Left arm, Chest, Abdomen, Front perineal area, Right upper leg, Left upper leg Body parts bathed by helper: Buttocks, Right lower leg, Left lower leg, Back Assist Level: 2 helpers  Function- Upper Body Dressing/Undressing What is the patient wearing?: Pull over shirt/dress Pull over shirt/dress - Perfomed by patient: Thread/unthread right sleeve, Thread/unthread left sleeve Pull over shirt/dress - Perfomed by helper: Put head through opening, Pull shirt over trunk Function - Lower Body Dressing/Undressing What is the patient wearing?: Pants, Underwear, Non-skid slipper socks Position: Wheelchair/chair at  sink Underwear - Performed by patient: Thread/unthread left underwear leg Underwear - Performed by helper: Thread/unthread right underwear leg, Thread/unthread left underwear leg, Pull underwear up/down Pants- Performed by patient: Thread/unthread left pants leg Pants- Performed by helper: Thread/unthread right pants leg, Thread/unthread left pants leg, Pull pants up/down, Fasten/unfasten pants Non-skid slipper socks- Performed by helper: Don/doff right sock, Don/doff left sock TED Hose - Performed by helper: Don/doff right TED hose, Don/doff left TED hose Assist for lower body dressing: 2 Helpers     Function - Air cabin crew transfer assistive device: Bedside commode Assist level to bedside commode (at bedside): 2 helpers Assist level from bedside commode (at bedside): 2 helpers  Function - Chair/bed transfer Chair/bed transfer method: Stand pivot Chair/bed transfer assist level: Maximal assist (Pt 25 - 49%/lift and lower) Chair/bed transfer assistive device: Armrests Chair/bed transfer details: Verbal cues for technique, Verbal cues for precautions/safety, Verbal cues for sequencing, Manual facilitation for placement, Manual facilitation for weight bearing, Manual facilitation for weight shifting  Function - Locomotion: Wheelchair Will patient use wheelchair at discharge?: Yes Type: Manual Max wheelchair distance: 10 (extremely slowly) Assist Level: Moderate assistance (Pt 50 - 74%) Wheel 50 feet with 2 turns activity did not occur: Safety/medical concerns (lethargy,fatigue) Wheel 150 feet activity did not occur: Safety/medical concerns (lethargy, fatigue) Turns around,maneuvers to table,bed, and toilet,negotiates 3% grade,maneuvers on rugs and over doorsills: No Function - Locomotion: Ambulation Ambulation activity did not occur: Safety/medical concerns (unsafe due to knee buckling)  Function - Comprehension Comprehension: Auditory Comprehension assist level: Follows  basic conversation/direction with extra time/assistive device  Function - Expression Expression: Verbal Expression assist level: Expresses basic 90% of the time/requires cueing < 10% of the time.  Function - Social  Interaction Social Interaction assist level: Interacts appropriately 75 - 89% of the time - Needs redirection for appropriate language or to initiate interaction.  Function - Problem Solving Problem solving assist level: Solves basic 50 - 74% of the time/requires cueing 25 - 49% of the time  Function - Memory Memory assist level: Recognizes or recalls 50 - 74% of the time/requires cueing 25 - 49% of the time Patient normally able to recall (first 3 days only): Current season, Staff names and faces, That he or she is in a hospital  Medical Problem List and Plan: 1.  Mobilty,cognitive and functional deficits secondary to bi-cerebral embolic CVA              -Cont inpatient rehab-PT, OT SLPSBP >220 or DBP >120 so Hydralazine can be given and therapy resumed  2.  DVT Prophylaxis/Anticoagulation: Pharmaceutical: Coumadin              -pt supratherapeutic now with microscopic hematuria 3. Pain Management:  T#3 prn for headache, d/c tramadol, if not better as BP normalizes will trial topamax       4. Mood: team to provide ego support 5. Neuropsych: This patient is not capable of making decisions on his own behalf--question of mild cognitive deficits at baseline.   6. Skin/Wound Care: routine pressure relief measures. Add supplements between meals.   7. Fluids/Electrolytes/Nutrition:  Monitor I/O. Meal intake 15-60% add Nepro, fluid intake minimal cont nocturnal IVF 8. HTN: permissive hypertension with prn hydralazine (increase from 95m to 284m only for SBP >220 or DBP >120.             -holding  catapres, hctz, lisinopril restarted catapres .72m55mID, increase calanSR to 240  Filed Vitals:   11/26/15 0329 11/26/15 0535  BP: 170/115 180/90  Pulse: 78 61  Temp:  98.2 F (36.8 C)   Resp:  18   9. A Fib: cardizem/warfarin. Mild RVR  HR 61 cont current dose calan- ? Pain related Monitor HR with exertion 10 Hyponatremia: mild. Encourage appropriate nutrition 11. CKD: Cr 3.37 today.Initiate IVF Follow i's and o's, repeat BMET stable 12. H/o gout: allopurinol d/c due to increased creat 13. Gastric ulcers/GIB:  Protonix, monitor any bleeding while on warfarin             -hgb 13.9 14.  Hematuria without dysuria,  UA neg, C and S pending  LOS (Days) 4 A FACE TO FACE EVALUATION WAS PERFORMED  KIRSTEINS,ANDREW E 11/26/2015, 7:45 AM

## 2015-11-26 NOTE — Progress Notes (Signed)
Occupational Therapy Session Note  Patient Details  Name: Patrick [name] MRN: [id number] Date of Birth: Jan 21, 1946  Today's Date: 11/26/2015 OT Individual Time: [id number] OT Individual Time Calculation (min): 34 min    Skilled Therapeutic Interventions/Progress Updates:    Pt asleep at beginning of session with meal tray untouched in front of him.  [name] [name] also present in room.  Had pt transfer to the EOB with max assist and work on self feeding using the RUE, while having to maintain sitting balance EOM unsupported.  He was able to complete 20 mins of self feeding with the RUE as well with supervision without any adaptations to utensils.  Close supervision for balance.  Transferred pt to the wheelchair with max assist stand pivot to allow for back support and self feeding in supported sitting at end of session.  Pt with increased trunk, hip, and knee flexion during transfer.  Decreased ability to weightshift in standing for advancing LEs.  Pt left with safety belt in place and call button, phone, and urinal in reach beside of him on the bed.   Therapy Documentation Precautions:  Precautions Precautions: Fall Precaution Comments: RLE hemiparesis Restrictions Weight Bearing Restrictions: No  Pain: Pain Assessment Pain Assessment: 0-10 Pain Score: 2  Faces Pain Scale: Hurts a little bit Pain Type: Acute pain Pain Location: Head Pain Orientation: Right;Left;Posterior Pain Descriptors / Indicators: Aching Pain Intervention(s): Emotional support;Repositioned ADL: See Function Navigator for Current Functional Status.   Therapy/Group: Individual Therapy  Frannie Shedrick OTR/L 11/26/2015, 4:11 PM

## 2015-11-26 NOTE — Progress Notes (Signed)
ANTICOAGULATION CONSULT NOTE - Follow Up Consult  Pharmacy Consult for coumadin Indication: atrial fibrillation  No Known Allergies  Patient Measurements: Height: 5' 9.5" (176.5 cm) Weight: 164 lb 11.2 oz (74.707 kg) IBW/kg (Calculated) : 71.85 Heparin Dosing Weight:   Vital Signs: Temp: 98.2 F (36.8 C) (04/29 0535) Temp Source: Oral (04/29 0535) BP: 180/90 mmHg (04/29 0535) Pulse Rate: 61 (04/29 0535)  Labs:  Recent Labs  11/24/15 0458 11/24/15 1018 11/25/15 0456 11/25/15 0723 11/26/15 0448  HGB  --  13.4  --   --   --   HCT  --  41.4  --   --   --   PLT  --  186  --   --   --   LABPROT 30.1*  --  28.8*  --  28.4*  INR 2.93*  --  2.77*  --  2.71*  CREATININE  --   --   --  3.19*  --     Estimated Creatinine Clearance: 22.9 mL/min (by C-G formula based on Cr of 3.19).   Medications:  Scheduled:  . aspirin EC  81 mg Oral Daily  . cloNIDine  0.3 mg Oral TID  . diclofenac sodium  4 g Topical QID  . diltiazem  240 mg Oral Daily  . feeding supplement (NEPRO CARB STEADY)  237 mL Oral TID BM  . insulin aspart  0-20 Units Subcutaneous TID WC  . insulin aspart  0-5 Units Subcutaneous QHS  . polyethylene glycol  17 g Oral Daily  . warfarin  2.5 mg Oral ONCE-1800  . Warfarin - Pharmacist Dosing Inpatient   Does not apply q1800   Infusions:  . sodium chloride 75 mL/hr at 11/25/15 1854    Assessment: 70 yo male with afib is currently on therapeutic coumadin.  INR today is 2.71 from 2.77. Note dose was held on 4/26.   Goal of Therapy:  INR 2-3 Monitor platelets by anticoagulation protocol: Yes   Plan:  - Repeat Coumadin 2.5 mg po x1 - Daily PT / INR  Sloan Leiter, PharmD, BCPS Clinical Pharmacist [phone] 11/26/2015,10:11 AM

## 2015-11-26 NOTE — Progress Notes (Signed)
Physical Therapy Session Note  Patient Details  Name: Patrick [name] MRN: [id number] Date of Birth: 02-02-1946  Today's Date: 11/26/2015 PT Individual Time: 1400-1445 PT Individual Time Calculation (min): 45 min   Short Term Goals: Week 1:  PT Short Term Goal 1 (Week 1): pt will perform supine> sit with min assist PT Short Term Goal 2 (Week 1): pt will transfer bed>< w/c wiht mod assist PT Short Term Goal 3 (Week 1): pt will propel w/c x 50' with min assist PT Short Term Goal 4 (Week 1): pt will perform gait x 15' with 2 helpers Week 2:     Skilled Therapeutic Interventions/Progress Updates:    Patient received sitting in [facility] and agreeable to PT. PT instructed patient in [place] mobility with LLE and BUE propulsion with mod A to maintain straight trajectory for 32ft and constant cues for sequencing and increased R UE involvement. WC<>bed Transfer training performed x 3 with max A for squat pivot technique; constant cues for UE positioning as well as improved forward weight shift. Scooting EOB performed on mat table from front of mat to side of mat Max A with scoot with constant cues for improved hip/head positioning, improved use of LLE, and proper UE positioning. Supine<>sit training x 3 with max A from PT and cues for UE positioning and assist witrh BLE into/out of bed. Sitting balance and Reach forward and Laterally x 4 each UE while maintaining 1 UE support on table. Min A provided to prevent posterior LOB.  Patient returned to room and left supine in bed with call bell within reach.    Therapy Documentation Precautions:  Precautions Precautions: Fall Precaution Comments: RLE hemiparesis Restrictions Weight Bearing Restrictions: No General:   Vital Signs: Therapy Vitals Temp: 97.6 F (36.4 C) Temp Source: Oral Pulse Rate: 99 Resp: 18 BP: 129/81 mmHg Patient Position (if appropriate): Sitting Oxygen Therapy SpO2: 99 % O2 Device: Not Delivered    See Function Navigator for Current  Functional Status.   Therapy/Group: Individual Therapy  Lorie Phenix 11/26/2015, 6:04 PM

## 2015-11-26 NOTE — Progress Notes (Signed)
Occupational Therapy Session Note  Patient Details  Name: Patrick [name] MRN: [id number] Date of Birth: 1946-07-11  Today's Date: 11/26/2015 OT Individual Time: 0900-1003 OT Individual Time Calculation (min): 63 min    Short Term Goals: Week 1:  OT Short Term Goal 1 (Week 1): Pt will complete UB bathing with supervison sitting unsupported EOM or EOB.   OT Short Term Goal 2 (Week 1): Pt will perform LB bathing sit to stand with mod assist 2 consecutive sessions. OT Short Term Goal 3 (Week 1): Pt will donn pullover shirt with supervison in unsupported sitting. OT Short Term Goal 4 (Week 1): Pt will complete stand pivot transfers with LRAD and mod assist to the 3:1 or shower bench.  OT Short Term Goal 5 (Week 1): Pt will tolerate 30 mins B/D session with no more than 2 rest breaks.   Skilled Therapeutic Interventions/Progress Updates:    Bathing and dressing sit to stand at the sink this session.  Pt still with limited endurance, requiring rest breaks with each step of task.  Completed UB bathing in supported sitting from wheelchair.  Unable to maintain sitting balance unsupported while engaged in bathing tasks.  Max assist for crossing the RLE over the left knee for removing socks, washing feet, and donning new clothing.  Max assist for standing in order for therapist to assist with washing peri care and for pulling pants up over hips.  He was able to use the RUE to assist with all bathing with increased time as well as sit to stand.  Mod instructional cueing to position UEs on arm rest of chair for sit to stand instead of grabbing the sink.  Pt left in wheelchair with safety belt in place at end of session.  BP taken in supine to start session 162/116.    Therapy Documentation Precautions:  Precautions Precautions: Fall Precaution Comments: RLE hemiparesis Restrictions Weight Bearing Restrictions: No  Pain: Pain Assessment Pain Assessment: Faces Faces Pain Scale: Hurts a little bit Pain  Type: Acute pain Pain Location: Leg Pain Orientation: Right;Left;Posterior Pain Intervention(s): Emotional support;Repositioned ADL: See Function Navigator for Current Functional Status.   Therapy/Group: Individual Therapy  Chandler Swiderski OTR/L 11/26/2015, 12:35 PM

## 2015-11-26 NOTE — Progress Notes (Signed)
Speech Language Pathology Daily Session Note  Patient Details  Name: Patrick [name] MRN: [id number] Date of Birth: Dec 12, 1945  Today's Date: 11/26/2015 SLP Individual Time: [id number] SLP Individual Time Calculation (min): 45 min  Short Term Goals: Week 1: SLP Short Term Goal 1 (Week 1): Pt will recall basic, daily information with mod assist verbal cues for use of external aids.  SLP Short Term Goal 2 (Week 1): Pt will attend to basic, familiar tasks for 5-10 minute intervals with mod verbal cues for redirection.  SLP Short Term Goal 3 (Week 1): Pt will complete basic, familiar tasks with mod assist verbal cues for functional problem solving   Skilled Therapeutic Interventions: Skilled ST session focused on cognitive goals. SLP facilitated session by introducing new basic card game (UNO). After initial instruction, pt required mod A to min A verbal cues for problem solving game. There were occasions when it didn't appear that pt could read the words on the card (such as SKIP etc). Pt demonstrated good independent initiation during task and effectively maintained sustained attention for 30 minutes with only intermittent min A verbal cues. Pt independently recalled 2 activities from previous session. At the end of his session he was able to effectively use the call bell to request to return to bed. Pt was left in wheelchair with safety belt in place and all needs within reach. Continue current plan of care.   Function:  Cognition Comprehension Comprehension assist level: Understands basic 90% of the time/cues < 10% of the time  Expression   Expression assist level: Expresses complex 90% of the time/cues < 10% of the time  Social Interaction Social Interaction assist level: Interacts appropriately 75 - 89% of the time - Needs redirection for appropriate language or to initiate interaction.  Problem Solving Problem solving assist level: Solves basic 50 - 74% of the time/requires cueing 25 - 49% of the  time  Memory Memory assist level: Recognizes or recalls 50 - 74% of the time/requires cueing 25 - 49% of the time    Pain    Therapy/Group: Individual Therapy  Glendola Friedhoff 11/26/2015, 12:27 PM

## 2015-11-27 DIAGNOSIS — R739 Hyperglycemia, unspecified: Secondary | ICD-10-CM

## 2015-11-27 LAB — PROTIME-INR
INR: 2.85 — AB (ref 0.00–1.49)
PROTHROMBIN TIME: 29.5 s — AB (ref 11.6–15.2)

## 2015-11-27 LAB — GLUCOSE, CAPILLARY
GLUCOSE-CAPILLARY: 172 mg/dL — AB (ref 65–99)
GLUCOSE-CAPILLARY: 294 mg/dL — AB (ref 65–99)
GLUCOSE-CAPILLARY: 158 mg/dL — AB (ref 65–99)
GLUCOSE-CAPILLARY: 86 mg/dL (ref 65–99)

## 2015-11-27 MED ORDER — WARFARIN SODIUM 5 MG PO TABS
2.5000 mg | ORAL_TABLET | Freq: Once | ORAL | Status: AC
  Administered 2015-11-27: 2.5 mg via ORAL
  Filled 2015-11-27: qty 1

## 2015-11-27 MED ORDER — GLIMEPIRIDE 1 MG PO TABS
1.0000 mg | ORAL_TABLET | Freq: Every day | ORAL | Status: DC
  Administered 2015-11-27 – 2015-11-30 (×4): 1 mg via ORAL
  Filled 2015-11-27 (×4): qty 1

## 2015-11-27 NOTE — Procedures (Signed)
Pt does not wish to wear cpap tonight will inform RT if anything changes. 

## 2015-11-27 NOTE — Progress Notes (Signed)
ANTICOAGULATION CONSULT NOTE - Follow Up Consult  Pharmacy Consult for coumadin Indication: atrial fibrillation  No Known Allergies  Patient Measurements: Height: 5' 9.5" (176.5 cm) Weight: 168 lb 1.6 oz (76.25 kg) IBW/kg (Calculated) : 71.85 Heparin Dosing Weight:   Vital Signs: Temp: 98.3 F (36.8 C) (04/30 0545) Temp Source: Oral (04/30 0545) BP: 182/86 mmHg (04/30 0545) Pulse Rate: 82 (04/30 0545)  Labs:  Recent Labs  11/24/15 1018 11/25/15 0456 11/25/15 0723 11/26/15 0448 11/27/15 0715  HGB 13.4  --   --   --   --   HCT 41.4  --   --   --   --   PLT 186  --   --   --   --   LABPROT  --  28.8*  --  28.4* 29.5*  INR  --  2.77*  --  2.71* 2.85*  CREATININE  --   --  3.19*  --   --     Estimated Creatinine Clearance: 22.9 mL/min (by C-G formula based on Cr of 3.19).   Medications:  Scheduled:  . aspirin EC  81 mg Oral Daily  . cloNIDine  0.3 mg Oral TID  . diclofenac sodium  4 g Topical QID  . diltiazem  240 mg Oral Daily  . feeding supplement (NEPRO CARB STEADY)  237 mL Oral TID BM  . glimepiride  1 mg Oral Q breakfast  . insulin aspart  0-20 Units Subcutaneous TID WC  . insulin aspart  0-5 Units Subcutaneous QHS  . polyethylene glycol  17 g Oral Daily  . Warfarin - Pharmacist Dosing Inpatient   Does not apply q1800   Infusions:  . sodium chloride 75 mL/hr at 11/26/15 1848    Assessment: 70 yo male with afib is currently on therapeutic coumadin.  INR today is 2.85 from 2.71. Note dose was held on 4/26.   Goal of Therapy:  INR 2-3 Monitor platelets by anticoagulation protocol: Yes   Plan:  - Repeat Coumadin 2.5 mg po x1 - Daily PT / INR  Sloan Leiter, PharmD, BCPS Clinical Pharmacist [phone] 11/27/2015,8:28 AM

## 2015-11-27 NOTE — Progress Notes (Signed)
Subjective/Complaints: HA improving Feels tired with poor appetite but denies depression No visible blood in last urine sample    ROS- denies any CP/SOB, N/V/D, +HA  Objective: Vital Signs: Blood pressure 182/86, pulse 82, temperature 98.3 F (36.8 C), temperature source Oral, resp. rate 18, height 5' 9.5" (1.765 m), weight 76.25 kg (168 lb 1.6 oz), SpO2 97 %. No results found. Results for orders placed or performed during the hospital encounter of 11/22/15 (from the past 72 hour(s))  CBC     Status: Abnormal   Collection Time: 11/24/15 10:18 AM  Result Value Ref Range   WBC 8.0 4.0 - 10.5 K/uL   RBC 5.29 4.22 - 5.81 MIL/uL   Hemoglobin 13.4 13.0 - 17.0 g/dL   HCT 41.4 39.0 - 52.0 %   MCV 78.3 78.0 - 100.0 fL   MCH 25.3 (L) 26.0 - 34.0 pg   MCHC 32.4 30.0 - 36.0 g/dL   RDW 14.4 11.5 - 15.5 %   Platelets 186 150 - 400 K/uL    Comment: SPECIMEN CHECKED FOR CLOTS REPEATED TO VERIFY PLATELET COUNT CONFIRMED BY SMEAR   Glucose, capillary     Status: Abnormal   Collection Time: 11/24/15 12:05 PM  Result Value Ref Range   Glucose-Capillary 210 (H) 65 - 99 mg/dL   Comment 1 Notify RN   Glucose, capillary     Status: Abnormal   Collection Time: 11/24/15  4:44 PM  Result Value Ref Range   Glucose-Capillary 173 (H) 65 - 99 mg/dL   Comment 1 Notify RN   Urinalysis, Routine w reflex microscopic (not at [facility])     Status: Abnormal   Collection Time: 11/24/15  7:07 PM  Result Value Ref Range   Color, Urine YELLOW YELLOW   APPearance CLEAR CLEAR   Specific Gravity, Urine 1.019 1.005 - 1.030   pH 5.5 5.0 - 8.0   Glucose, UA 100 (A) NEGATIVE mg/dL   Hgb urine dipstick LARGE (A) NEGATIVE   Bilirubin Urine NEGATIVE NEGATIVE   Ketones, ur NEGATIVE NEGATIVE mg/dL   Protein, ur >300 (A) NEGATIVE mg/dL   Nitrite NEGATIVE NEGATIVE   Leukocytes, UA NEGATIVE NEGATIVE  Urine microscopic-add on     Status: Abnormal   Collection Time: 11/24/15  7:07 PM  Result Value Ref Range   Squamous Epithelial / LPF 0-5 (A) NONE SEEN   WBC, UA 0-5 0 - 5 WBC/hpf   RBC / HPF 6-30 0 - 5 RBC/hpf   Bacteria, UA FEW (A) NONE SEEN  Urine culture     Status: Abnormal   Collection Time: 11/24/15  7:08 PM  Result Value Ref Range   Specimen Description URINE, CLEAN CATCH    Special Requests NONE    Culture MULTIPLE SPECIES PRESENT, SUGGEST RECOLLECTION (A)    Report Status 11/26/2015 FINAL   Glucose, capillary     Status: Abnormal   Collection Time: 11/24/15  8:46 PM  Result Value Ref Range   Glucose-Capillary 226 (H) 65 - 99 mg/dL  Protime-INR     Status: Abnormal   Collection Time: 11/25/15  4:56 AM  Result Value Ref Range   Prothrombin Time 28.8 (H) 11.6 - 15.2 seconds   INR 2.77 (H) 0.00 - 1.49  Glucose, capillary     Status: Abnormal   Collection Time: 11/25/15  6:35 AM  Result Value Ref Range   Glucose-Capillary 149 (H) 65 - 99 mg/dL  Basic metabolic panel     Status: Abnormal   Collection Time: 11/25/15  7:23 AM  Result Value Ref Range   Sodium 132 (L) 135 - 145 mmol/L   Potassium 3.9 3.5 - 5.1 mmol/L   Chloride 97 (L) 101 - 111 mmol/L   CO2 19 (L) 22 - 32 mmol/L   Glucose, Bld 159 (H) 65 - 99 mg/dL   BUN 51 (H) 6 - 20 mg/dL   Creatinine, Ser 3.19 (H) 0.61 - 1.24 mg/dL   Calcium 8.9 8.9 - 10.3 mg/dL   GFR calc non Af Amer 19 (L) >60 mL/min   GFR calc Af Amer 22 (L) >60 mL/min    Comment: (NOTE) The eGFR has been calculated using the CKD EPI equation. This calculation has not been validated in all clinical situations. eGFR's persistently <60 mL/min signify possible Chronic Kidney Disease.    Anion gap 16 (H) 5 - 15  Glucose, capillary     Status: Abnormal   Collection Time: 11/25/15 11:33 AM  Result Value Ref Range   Glucose-Capillary 237 (H) 65 - 99 mg/dL   Comment 1 Notify RN   Glucose, capillary     Status: Abnormal   Collection Time: 11/25/15  4:41 PM  Result Value Ref Range   Glucose-Capillary 154 (H) 65 - 99 mg/dL   Comment 1 Notify RN   Glucose,  capillary     Status: Abnormal   Collection Time: 11/25/15  9:21 PM  Result Value Ref Range   Glucose-Capillary 170 (H) 65 - 99 mg/dL  Protime-INR     Status: Abnormal   Collection Time: 11/26/15  4:48 AM  Result Value Ref Range   Prothrombin Time 28.4 (H) 11.6 - 15.2 seconds   INR 2.71 (H) 0.00 - 1.49  Glucose, capillary     Status: Abnormal   Collection Time: 11/26/15  6:45 AM  Result Value Ref Range   Glucose-Capillary 166 (H) 65 - 99 mg/dL  Glucose, capillary     Status: Abnormal   Collection Time: 11/26/15 11:47 AM  Result Value Ref Range   Glucose-Capillary 209 (H) 65 - 99 mg/dL   Comment 1 Notify RN   Glucose, capillary     Status: Abnormal   Collection Time: 11/26/15  4:23 PM  Result Value Ref Range   Glucose-Capillary 172 (H) 65 - 99 mg/dL   Comment 1 Notify RN   Glucose, capillary     Status: Abnormal   Collection Time: 11/26/15  9:40 PM  Result Value Ref Range   Glucose-Capillary 210 (H) 65 - 99 mg/dL  Glucose, capillary     Status: Abnormal   Collection Time: 11/27/15  6:44 AM  Result Value Ref Range   Glucose-Capillary 172 (H) 65 - 99 mg/dL     HEENT: normal Cardio: RRR and no murmur Resp: CTA B/L and unlabored GI: BS positive and NT, ND GU:  sm amt meatal blood mixed with urine Extremity:  Pulses positive and No Edema Skin:   Intact Neuro: Alert/Oriented, Normal Sensory, Abnormal Motor 4/5 in RUE , 2- RLE, 5/5 on L side, Abnormal FMC Ataxic/ dec FMC and Dysarthric Musc/Skel:  Other no pain with UE or LE ROM, no joint swelling Gen NAD   Assessment/Plan: 1. Functional deficits secondary to bilateral posterior frontal and parietal infarct with R LE> UE paresis which require 3+ hours per day of interdisciplinary therapy in a comprehensive inpatient rehab setting. Physiatrist is providing close team supervision and 24 hour management of active medical problems listed below. Physiatrist and rehab team continue to assess barriers to discharge/monitor patient  progress toward functional and medical goals. FIM: Function - Bathing Position: Wheelchair/chair at sink Body parts bathed by patient: Right arm, Left arm, Chest, Abdomen, Front perineal area, Right upper leg, Left upper leg Body parts bathed by helper: Back, Left lower leg, Right lower leg, Buttocks Assist Level: 2 helpers  Function- Upper Body Dressing/Undressing What is the patient wearing?: Pull over shirt/dress Pull over shirt/dress - Perfomed by patient: Thread/unthread right sleeve, Thread/unthread left sleeve, Put head through opening, Pull shirt over trunk Pull over shirt/dress - Perfomed by helper: Put head through opening, Pull shirt over trunk Assist Level: Supervision or verbal cues Function - Lower Body Dressing/Undressing What is the patient wearing?: Pants, Underwear, Non-skid slipper socks Position: Wheelchair/chair at sink Underwear - Performed by patient: Thread/unthread right underwear leg Underwear - Performed by helper: Thread/unthread left underwear leg, Pull underwear up/down Pants- Performed by patient: Thread/unthread left pants leg Pants- Performed by helper: Thread/unthread right pants leg, Thread/unthread left pants leg, Pull pants up/down Non-skid slipper socks- Performed by helper: Don/doff right sock, Don/doff left sock TED Hose - Performed by helper: Don/doff right TED hose, Don/doff left TED hose Assist for lower body dressing: 2 Helpers     Function - Air cabin crew transfer assistive device: Bedside commode Assist level to bedside commode (at bedside): 2 helpers Assist level from bedside commode (at bedside): 2 helpers  Function - Chair/bed transfer Chair/bed transfer method: Squat pivot Chair/bed transfer assist level: Maximal assist (Pt 25 - 49%/lift and lower) Chair/bed transfer assistive device: Armrests Chair/bed transfer details: Verbal cues for technique, Verbal cues for precautions/safety, Verbal cues for sequencing, Manual  facilitation for placement, Manual facilitation for weight bearing, Manual facilitation for weight shifting  Function - Locomotion: Wheelchair Will patient use wheelchair at discharge?: Yes Type: Manual Max wheelchair distance: 30 ft Assist Level: Moderate assistance (Pt 50 - 74%) Wheel 50 feet with 2 turns activity did not occur: Safety/medical concerns (lethargy,fatigue) Wheel 150 feet activity did not occur: Safety/medical concerns (lethargy, fatigue) Turns around,maneuvers to table,bed, and toilet,negotiates 3% grade,maneuvers on rugs and over doorsills: No Function - Locomotion: Ambulation Ambulation activity did not occur: Safety/medical concerns (unsafe due to knee buckling)  Function - Comprehension Comprehension: Auditory Comprehension assist level: Understands basic 90% of the time/cues < 10% of the time  Function - Expression Expression: Verbal Expression assist level: Expresses complex 90% of the time/cues < 10% of the time  Function - Social Interaction Social Interaction assist level: Interacts appropriately 75 - 89% of the time - Needs redirection for appropriate language or to initiate interaction.  Function - Problem Solving Problem solving assist level: Solves basic 50 - 74% of the time/requires cueing 25 - 49% of the time  Function - Memory Memory assist level: Recognizes or recalls 50 - 74% of the time/requires cueing 25 - 49% of the time Patient normally able to recall (first 3 days only): Current season, Staff names and faces, That he or she is in a hospital  Medical Problem List and Plan: 1.  Mobilty,cognitive and functional deficits secondary to bi-cerebral embolic CVA              -   2.  DVT Prophylaxis/Anticoagulation: Pharmaceutical: Coumadin              -pt supratherapeutic now with microscopic hematuria 3. Pain Management:  T#3 prn for headache, d/c tramadol, if not better as BP normalizes will trial topamax       4. Mood: team to provide ego  support  5. Neuropsych: This patient is not capable of making decisions on his own behalf--question of mild cognitive deficits at baseline.   6. Skin/Wound Care: routine pressure relief measures. Add supplements between meals.   7. Fluids/Electrolytes/Nutrition:  Monitor I/O. Meal intake 15-60% add Nepro, fluid intake minimal cont nocturnal IVF 8. HTN: permissive hypertension with prn hydralazine (increase from 50m to 288m only for SBP >220 or DBP >120.             -holding  , hctz, lisinopril restarted catapres .51m65mID, increase calanSR to 240 - improving Filed Vitals:   11/26/15 2023 11/27/15 0545  BP: 144/80 182/86  Pulse: 66 82  Temp:  98.3 F (36.8 C)  Resp:  18   9. A Fib: cardizem/warfarin. Mild RVR  HR 66 cont current dose calan- ? Pain related Monitor HR with exertion 10 Hyponatremia: mild. Encourage appropriate nutrition 11. CKD: Cr 3.37 today.Initiate IVF Follow i's and o's, repeat BMET stable creat sl better   12. H/o gout: allopurinol d/c due to increased creat, no flares 13. Gastric ulcers/GIB:  Protonix, monitor any bleeding while on warfarin             -hgb 13.9 14.  Hematuria without dysuria,  UA neg except hematuria, C and S pending 15.  Elevated CBG, check Hgb A1C start amaryl LOS (Days) 5 A FACE TO FACE EVALUATION WAS PERFORMED  Juwan Vences E 11/27/2015, 7:50 AM

## 2015-11-27 NOTE — Plan of Care (Signed)
Problem: RH SKIN INTEGRITY Goal: RH STG SKIN FREE OF INFECTION/BREAKDOWN Free of skin breakdown min assist  Outcome: Not Progressing Fissure and MASD noted buttocks

## 2015-11-28 ENCOUNTER — Inpatient Hospital Stay (HOSPITAL_COMMUNITY): Payer: Medicare Other | Admitting: Speech Pathology

## 2015-11-28 ENCOUNTER — Encounter (HOSPITAL_COMMUNITY): Payer: Medicare Other

## 2015-11-28 ENCOUNTER — Inpatient Hospital Stay (HOSPITAL_COMMUNITY): Payer: Medicare Other | Admitting: Occupational Therapy

## 2015-11-28 ENCOUNTER — Inpatient Hospital Stay (HOSPITAL_COMMUNITY): Payer: Medicare Other

## 2015-11-28 LAB — GLUCOSE, CAPILLARY
GLUCOSE-CAPILLARY: 117 mg/dL — AB (ref 65–99)
GLUCOSE-CAPILLARY: 142 mg/dL — AB (ref 65–99)
Glucose-Capillary: 185 mg/dL — ABNORMAL HIGH (ref 65–99)
Glucose-Capillary: 126 mg/dL — ABNORMAL HIGH (ref 65–99)

## 2015-11-28 LAB — HEMOGLOBIN A1C
HEMOGLOBIN A1C: 8.5 % — AB (ref 4.8–5.6)
Mean Plasma Glucose: 197 mg/dL

## 2015-11-28 LAB — PROTIME-INR
INR: 2.7 — AB (ref 0.00–1.49)
PROTHROMBIN TIME: 28.3 s — AB (ref 11.6–15.2)

## 2015-11-28 MED ORDER — WARFARIN SODIUM 5 MG PO TABS
2.5000 mg | ORAL_TABLET | Freq: Once | ORAL | Status: AC
  Administered 2015-11-28: 2.5 mg via ORAL
  Filled 2015-11-28: qty 1

## 2015-11-28 NOTE — Progress Notes (Signed)
ANTICOAGULATION CONSULT NOTE - Follow Up Consult  Pharmacy Consult for coumadin Indication: atrial fibrillation  No Known Allergies  Patient Measurements: Height: 5' 9.5" (176.5 cm) Weight: 170 lb 14.4 oz (77.52 kg) IBW/kg (Calculated) : 71.85 Heparin Dosing Weight:   Vital Signs: Temp: 97.4 F (36.3 C) (05/01 0529) Temp Source: Oral (05/01 0529) BP: 180/90 mmHg (05/01 0601) Pulse Rate: 64 (05/01 0529)  Labs:  Recent Labs  11/26/15 0448 11/27/15 0715 11/28/15 0605  LABPROT 28.4* 29.5* 28.3*  INR 2.71* 2.85* 2.70*    Estimated Creatinine Clearance: 22.9 mL/min (by C-G formula based on Cr of 3.19).   Medications:  Scheduled:  . aspirin EC  81 mg Oral Daily  . cloNIDine  0.3 mg Oral TID  . diclofenac sodium  4 g Topical QID  . diltiazem  240 mg Oral Daily  . feeding supplement (NEPRO CARB STEADY)  237 mL Oral TID BM  . glimepiride  1 mg Oral Q breakfast  . insulin aspart  0-20 Units Subcutaneous TID WC  . insulin aspart  0-5 Units Subcutaneous QHS  . polyethylene glycol  17 g Oral Daily  . Warfarin - Pharmacist Dosing Inpatient   Does not apply q1800   Infusions:  . sodium chloride Stopped (11/28/15 [id number])    Assessment: 70 yo male with afib is currently on therapeutic coumadin.  INR today is 2.7.  Goal of Therapy:  INR 2-3 Monitor platelets by anticoagulation protocol: Yes   Plan:  - Repeat Coumadin 2.5 mg po x1 - Daily PT / INR  Mandolin Falwell, Tsz-Yin 11/28/2015,8:18 AM

## 2015-11-28 NOTE — Progress Notes (Signed)
Recreational Therapy Session Note  Patient Details  Name: Patrick [name] MRN: [id number] Date of Birth: [birth year] Today's Date: 11/28/2015  Attempted leisure screen/eval with limited responses from pt.  Pt unable [name] recall his previous therapy schedule, names of staff and unable [name] state how he used his time PTA.  Pt stating he was tired and requesting [name] get back in bed, RN made aware.  Leisure interest form left in room for family completion.  Will continue [name] monitor.  [place] 11/28/2015, 2:30 PM

## 2015-11-28 NOTE — Progress Notes (Signed)
Physical Therapy Weekly Progress Note  Patient Details  Name: Patrick [name] MRN: [id number] Date of Birth: 05-Sep-1945  Beginning of progress report period: 11/01/23/17 End of progress report period:11/28/15  Today's Date: 11/28/2015 PT Individual Time: 1300-1400 PT Individual Time Calculation (min): 60 min   Patient has partly met 1 of 4 short term goals.  Patient continues to demonstrate the following deficits: activity tolerance, delayed processing, poor motor return RLE, balance, motor planning and therefore will continue to benefit from skilled PT intervention to enhance overall performance with activity tolerance, balance, ability to compensate for deficits, functional use of right and left lower extremity, attention and awareness.  Patient progressing toward long term goals..  Continue plan of care.  PT Short Term Goals Week 1:  PT Short Term Goal 1 (Week 1): pt will perform supine> sit with min assist PT Short Term Goal 1 - Progress (Week 1): Not met PT Short Term Goal 2 (Week 1): pt will transfer bed>< w/c wiht mod assist PT Short Term Goal 2 - Progress (Week 1): Not met PT Short Term Goal 3 (Week 1): pt will propel w/c x 50' with min assist PT Short Term Goal 3 - Progress (Week 1): Not met PT Short Term Goal 4 (Week 1): pt will perform gait x 15' with 2 helpers PT Short Term Goal 4 - Progress (Week 1): Partly met  Skilled Therapeutic Interventions/Progress Updates:   No pain reported.  W/c propulsion using hemi method, mod assist for steering due to limited use of LLE. Simulated car transfer squat pivot.  Sit> stand in parallel bars, pulling up; gait with max assist for trunk and R knee control and advancement of RLE. Up 1 step 2 rails with max assist; down backards +2 assist due to strong R lean and RLE control.  W/c> mat level transfer.  neuromuscular re-education via manual cues for 1 x 10 lower trunk rotation, bil bridging, 2 x 10 modified abdominal crunches.  Bed mobility on  firm mat.  Pt left resting in w/c with all needs in place, quick release belt donned.    Therapy Documentation Precautions:  Precautions Precautions: Fall Precaution Comments: RLE hemiparesis Restrictions Weight Bearing Restrictions: No Pain: Pain Assessment Pain Assessment: No/denies pain    See Function Navigator for Current Functional Status.  Therapy/Group: Individual Therapy  Reylene Stauder 11/28/2015, 4:09 PM

## 2015-11-28 NOTE — Progress Notes (Signed)
Occupational Therapy Note  Patient Details  Name: Hikaru Sowerby MRN: [id number] Date of Birth: 1945/11/23  Today's Date: 11/28/2015 OT Individual Time: 1100-1130 OT Individual Time Calculation (min): 30 min   Makeup Session  Pt denied pain Individual therapy (cotx with Recreational Therapist)  Pt resting in w/c upon arrival with head laterally flexed to right.  Pt able to bring head to midline and maintain throughout session.  Focus on cognitive tasks and awareness of deficits.  Pt unable to verbalize any deficits/changes after current stroke.  Pt unable to recall therapists from previous days or any activities/tasks engaged in.  Pt unable to recall names of staff who have worked with him earlier in the morning.  Pt unable to recall his address.  Pt with flat affect throughout session with delayed response to questions.     Leotis Shames [facility] 11/28/2015, 11:43 AM

## 2015-11-28 NOTE — Progress Notes (Addendum)
Speech Language Pathology Daily Session Note  Patient Details  Name: Patrick [name] MRN: [id number] Date of Birth: 1945-12-28  Today's Date: 11/28/2015 SLP Individual Time: 0800-0900 SLP Individual Time Calculation (min): 60 min  Short Term Goals: Week 1: SLP Short Term Goal 1 (Week 1): Pt will recall basic, daily information with mod assist verbal cues for use of external aids.  SLP Short Term Goal 2 (Week 1): Pt will attend to basic, familiar tasks for 5-10 minute intervals with mod verbal cues for redirection.  SLP Short Term Goal 3 (Week 1): Pt will complete basic, familiar tasks with mod assist verbal cues for functional problem solving   Skilled Therapeutic Interventions:  Pt was seen for skilled ST targeting cognitive goals.  Pt was awake and alert upon arrival, agreeable to participate in ST but continues with flat affect and slowed response time.  Pt completed basic self care tasks at the sink with supervision/set up assist and extra time.  Pt required mod assist question cues to recall 1 out of 2 details from previously taught card game; however, he was able to follow task rules and procedures following initial re-instruction with supervision.  Pt had difficulty remembering exactly where he lived prior to admission but could give general directions as to how to get to his apartment with min question cues.  Pt reported that he felt that this stroke "wasn't as bad" as his first stroke and endorses no significant changes in his "thinking."  Given pt's improved pain management and alertness, recommend re-attempting structured cognitive evaluation to aid in more specific and effective goals.  Pt was returned to room and left in wheelchair with quick release belt. Continue per current plan of care.      Function:  Eating Eating                 Cognition Comprehension Comprehension assist level: Follows basic conversation/direction with extra time/assistive device  Expression    Expression assist level: Expresses basic needs/ideas: With extra time/assistive device  Social Interaction Social Interaction assist level: Interacts appropriately 75 - 89% of the time - Needs redirection for appropriate language or to initiate interaction.  Problem Solving Problem solving assist level: Solves basic 75 - 89% of the time/requires cueing 10 - 24% of the time  Memory Memory assist level: Recognizes or recalls 50 - 74% of the time/requires cueing 25 - 49% of the time    Pain Pain Assessment Pain Assessment: No/denies pain  Therapy/Group: Individual Therapy  Jaiya Mooradian, Selinda Orion 11/28/2015, 12:22 PM

## 2015-11-28 NOTE — Progress Notes (Signed)
Subjective/Complaints: Eating better, still with poor po fluid intake No HA  ROS- denies any CP/SOB, N/V/D, +HA  Objective: Vital Signs: Blood pressure 180/90, pulse 64, temperature 97.4 F (36.3 C), temperature source Oral, resp. rate 18, height 5' 9.5" (1.765 m), weight 77.52 kg (170 lb 14.4 oz), SpO2 100 %. No results found. Results for orders placed or performed during the hospital encounter of 11/22/15 (from the past 72 hour(s))  Glucose, capillary     Status: Abnormal   Collection Time: 11/25/15 11:33 AM  Result Value Ref Range   Glucose-Capillary 237 (H) 65 - 99 mg/dL   Comment 1 Notify RN   Glucose, capillary     Status: Abnormal   Collection Time: 11/25/15  4:41 PM  Result Value Ref Range   Glucose-Capillary 154 (H) 65 - 99 mg/dL   Comment 1 Notify RN   Glucose, capillary     Status: Abnormal   Collection Time: 11/25/15  9:21 PM  Result Value Ref Range   Glucose-Capillary 170 (H) 65 - 99 mg/dL  Protime-INR     Status: Abnormal   Collection Time: 11/26/15  4:48 AM  Result Value Ref Range   Prothrombin Time 28.4 (H) 11.6 - 15.2 seconds   INR 2.71 (H) 0.00 - 1.49  Glucose, capillary     Status: Abnormal   Collection Time: 11/26/15  6:45 AM  Result Value Ref Range   Glucose-Capillary 166 (H) 65 - 99 mg/dL  Glucose, capillary     Status: Abnormal   Collection Time: 11/26/15 11:47 AM  Result Value Ref Range   Glucose-Capillary 209 (H) 65 - 99 mg/dL   Comment 1 Notify RN   Glucose, capillary     Status: Abnormal   Collection Time: 11/26/15  4:23 PM  Result Value Ref Range   Glucose-Capillary 172 (H) 65 - 99 mg/dL   Comment 1 Notify RN   Glucose, capillary     Status: Abnormal   Collection Time: 11/26/15  9:40 PM  Result Value Ref Range   Glucose-Capillary 210 (H) 65 - 99 mg/dL  Glucose, capillary     Status: Abnormal   Collection Time: 11/27/15  6:44 AM  Result Value Ref Range   Glucose-Capillary 172 (H) 65 - 99 mg/dL  Protime-INR     Status: Abnormal   Collection Time: 11/27/15  7:15 AM  Result Value Ref Range   Prothrombin Time 29.5 (H) 11.6 - 15.2 seconds   INR 2.85 (H) 0.00 - 1.49  Glucose, capillary     Status: Abnormal   Collection Time: 11/27/15 11:23 AM  Result Value Ref Range   Glucose-Capillary 294 (H) 65 - 99 mg/dL   Comment 1 Notify RN   Glucose, capillary     Status: Abnormal   Collection Time: 11/27/15  4:27 PM  Result Value Ref Range   Glucose-Capillary 158 (H) 65 - 99 mg/dL   Comment 1 Notify RN   Glucose, capillary     Status: None   Collection Time: 11/27/15  9:01 PM  Result Value Ref Range   Glucose-Capillary 86 65 - 99 mg/dL  Protime-INR     Status: Abnormal   Collection Time: 11/28/15  6:05 AM  Result Value Ref Range   Prothrombin Time 28.3 (H) 11.6 - 15.2 seconds   INR 2.70 (H) 0.00 - 1.49  Glucose, capillary     Status: Abnormal   Collection Time: 11/28/15  6:51 AM  Result Value Ref Range   Glucose-Capillary 117 (H) 65 - 99 mg/dL  HEENT: normal Cardio: RRR and no murmur Resp: CTA B/L and unlabored GI: BS positive and NT, ND GU:  sm amt meatal blood mixed with urine Extremity:  Pulses positive and No Edema Skin:   Intact Neuro: Alert/Oriented, Normal Sensory, Abnormal Motor 4/5 in RUE , 2- RLE, 5/5 on L side, Abnormal FMC Ataxic/ dec FMC and Dysarthric Musc/Skel:  Other no pain with UE or LE ROM, no joint swelling Gen NAD   Assessment/Plan: 1. Functional deficits secondary to bilateral posterior frontal and parietal infarct with R LE> UE paresis which require 3+ hours per day of interdisciplinary therapy in a comprehensive inpatient rehab setting. Physiatrist is providing close team supervision and 24 hour management of active medical problems listed below. Physiatrist and rehab team continue to assess barriers to discharge/monitor patient progress toward functional and medical goals. FIM: Function - Bathing Position: Wheelchair/chair at sink Body parts bathed by patient: Right arm, Left arm,  Chest, Abdomen, Front perineal area, Right upper leg, Left upper leg Body parts bathed by helper: Back, Left lower leg, Right lower leg, Buttocks Assist Level: 2 helpers  Function- Upper Body Dressing/Undressing What is the patient wearing?: Pull over shirt/dress Pull over shirt/dress - Perfomed by patient: Thread/unthread right sleeve, Thread/unthread left sleeve, Put head through opening, Pull shirt over trunk Pull over shirt/dress - Perfomed by helper: Put head through opening, Pull shirt over trunk Assist Level: Supervision or verbal cues Function - Lower Body Dressing/Undressing What is the patient wearing?: Pants, Underwear, Non-skid slipper socks Position: Wheelchair/chair at sink Underwear - Performed by patient: Thread/unthread right underwear leg Underwear - Performed by helper: Thread/unthread left underwear leg, Pull underwear up/down Pants- Performed by patient: Thread/unthread left pants leg Pants- Performed by helper: Thread/unthread right pants leg, Thread/unthread left pants leg, Pull pants up/down Non-skid slipper socks- Performed by helper: Don/doff right sock, Don/doff left sock TED Hose - Performed by helper: Don/doff right TED hose, Don/doff left TED hose Assist for lower body dressing: 2 Helpers     Function - Air cabin crew transfer assistive device: Bedside commode Assist level to bedside commode (at bedside): 2 helpers Assist level from bedside commode (at bedside): 2 helpers  Function - Chair/bed transfer Chair/bed transfer method: Squat pivot Chair/bed transfer assist level: Maximal assist (Pt 25 - 49%/lift and lower) Chair/bed transfer assistive device: Armrests Chair/bed transfer details: Verbal cues for technique, Verbal cues for precautions/safety, Verbal cues for sequencing, Manual facilitation for placement, Manual facilitation for weight bearing, Manual facilitation for weight shifting  Function - Locomotion: Wheelchair Will patient use  wheelchair at discharge?: Yes Type: Manual Max wheelchair distance: 30 ft Assist Level: Moderate assistance (Pt 50 - 74%) Wheel 50 feet with 2 turns activity did not occur: Safety/medical concerns (lethargy,fatigue) Wheel 150 feet activity did not occur: Safety/medical concerns (lethargy, fatigue) Turns around,maneuvers to table,bed, and toilet,negotiates 3% grade,maneuvers on rugs and over doorsills: No Function - Locomotion: Ambulation Ambulation activity did not occur: Safety/medical concerns (unsafe due to knee buckling)  Function - Comprehension Comprehension: Auditory Comprehension assistive device: Hearing aids Comprehension assist level: Understands basic 90% of the time/cues < 10% of the time  Function - Expression Expression: Verbal Expression assist level: Expresses complex 90% of the time/cues < 10% of the time  Function - Social Interaction Social Interaction assist level: Interacts appropriately 75 - 89% of the time - Needs redirection for appropriate language or to initiate interaction.  Function - Problem Solving Problem solving assist level: Solves basic 50 - 74% of the time/requires cueing  25 - 49% of the time  Function - Memory Memory assist level: Recognizes or recalls 50 - 74% of the time/requires cueing 25 - 49% of the time Patient normally able to recall (first 3 days only): Current season, Staff names and faces, That he or she is in a hospital  Medical Problem List and Plan: 1.  Mobilty,cognitive and functional deficits secondary to bi-cerebral embolic CVA              -   2.  DVT Prophylaxis/Anticoagulation: Pharmaceutical: Coumadin              -pt therapeutic now without gross  hematuria 3. Pain Management:  T#3 prn for headache, d/c tramadol, if not better as BP normalizes will trial topamax       4. Mood: team to provide ego support 5. Neuropsych: This patient is not capable of making decisions on his own behalf--question of mild cognitive deficits at  baseline.   6. Skin/Wound Care: routine pressure relief measures. Add supplements between meals.   7. Fluids/Electrolytes/Nutrition:  Monitor I/O. Meal intake 50-100% also on Nepro, fluid intake ~651ml cont nocturnal IVF 8. HTN: permissive hypertension with prn hydralazine 25mg  only for SBP >220 or DBP >120.             -holding  , hctz, lisinopril restarted catapres .3mg  TID, increase calanSR to 240 - will consider changing hydralazine to scheduled dose,further  increase in calan may cause excess bradycardia Filed Vitals:   11/28/15 0529 11/28/15 0601  BP: 191/105 180/90  Pulse: 64   Temp: 97.4 F (36.3 C)   Resp: 18    9. A Fib: cardizem/warfarin. Mild RVR  HR 66 cont current dose calan- ? Pain related Monitor HR with exertion 10 Hyponatremia: mild. Encourage appropriate nutrition 11. CKD: Cr 3.37 today.cont IVF Follow i's and o's, repeat BMET stable creat sl better   12. H/o gout: allopurinol d/c due to increased creat, no flares 13. Gastric ulcers/GIB:  Protonix, monitor any bleeding while on warfarin             -hgb 13.9 14.  Hematuria without dysuria,  UA neg except hematuria, C and S multispecies 15.  Elevated CBG, check Hgb A1C start amaryl CBG (last 3)   Recent Labs  11/27/15 1627 11/27/15 2101 11/28/15 0651  GLUCAP 158* 86 117*    LOS (Days) 6 A FACE TO FACE EVALUATION WAS PERFORMED  Patrick [name] E 11/28/2015, 7:57 AM

## 2015-11-28 NOTE — Progress Notes (Signed)
Occupational Therapy Session Note  Patient Details  Name: Patrick [name] MRN: [id number] Date of Birth: 1946-04-11  Today's Date: 11/28/2015 OT Individual Time: [id number] OT Individual Time Calculation (min): 83 min    Short Term Goals: Week 1:  OT Short Term Goal 1 (Week 1): Pt will complete UB bathing with supervison sitting unsupported EOM or EOB.   OT Short Term Goal 2 (Week 1): Pt will perform LB bathing sit to stand with mod assist 2 consecutive sessions. OT Short Term Goal 3 (Week 1): Pt will donn pullover shirt with supervison in unsupported sitting. OT Short Term Goal 4 (Week 1): Pt will complete stand pivot transfers with LRAD and mod assist to the 3:1 or shower bench.  OT Short Term Goal 5 (Week 1): Pt will tolerate 30 mins B/D session with no more than 2 rest breaks.   Skilled Therapeutic Interventions/Progress Updates:  Upon entering the room, pt supine in bed with no c/o pain but reports fatigue this session. Pt refused to transfer into wheelchair or shower this session but was agreeable to bathing while seated on EOB. Pt needing mod cues for sequencing, initiation, and attention to task. Pt requiring supervision - mod A for dynamic sitting balance during bathing task. OT assisted pt with bring foot to opposite knee in order to wash B lower feet and legs. Pt standing with max A for balance while he washed buttocks. Pt returning to bed and needing multiple rest breaks secondary to fatigue. Pt returning to supine with mod A . OT providing pt with paper handouts regarding stroke education and risk factors. OT educating pt on topics with pt verbalizing understanding. Education to continue. Bed alarm activated and call bell within reach upon exiting the room.   Therapy Documentation Precautions:  Precautions Precautions: Fall Precaution Comments: RLE hemiparesis Restrictions Weight Bearing Restrictions: No Vital Signs: Therapy Vitals Temp: 97.4 F (36.3 C) Temp Source:  Axillary Pulse Rate: (!) 56 Resp: 18 BP: (!) 149/88 mmHg Patient Position (if appropriate): Lying Oxygen Therapy SpO2: 100 % O2 Device: Not Delivered Pain: Pain Assessment Pain Assessment: No/denies pain  See Function Navigator for Current Functional Status.   Therapy/Group: Individual Therapy  Phineas Semen 11/28/2015, 3:51 PM

## 2015-11-29 ENCOUNTER — Inpatient Hospital Stay (HOSPITAL_COMMUNITY): Payer: Medicare Other | Admitting: Occupational Therapy

## 2015-11-29 ENCOUNTER — Inpatient Hospital Stay (HOSPITAL_COMMUNITY): Payer: Medicare Other | Admitting: Physical Therapy

## 2015-11-29 ENCOUNTER — Inpatient Hospital Stay (HOSPITAL_COMMUNITY): Payer: Medicare Other | Admitting: Speech Pathology

## 2015-11-29 ENCOUNTER — Inpatient Hospital Stay (HOSPITAL_COMMUNITY): Payer: Medicare Other | Admitting: *Deleted

## 2015-11-29 LAB — GLUCOSE, CAPILLARY
GLUCOSE-CAPILLARY: 167 mg/dL — AB (ref 65–99)
Glucose-Capillary: 90 mg/dL (ref 65–99)
Glucose-Capillary: 190 mg/dL — ABNORMAL HIGH (ref 65–99)
Glucose-Capillary: 187 mg/dL — ABNORMAL HIGH (ref 65–99)

## 2015-11-29 LAB — PROTIME-INR
INR: 2.12 — ABNORMAL HIGH (ref 0.00–1.49)
Prothrombin Time: 23.6 seconds — ABNORMAL HIGH (ref 11.6–15.2)

## 2015-11-29 MED ORDER — DILTIAZEM HCL ER COATED BEADS 300 MG PO CP24
300.0000 mg | ORAL_CAPSULE | Freq: Every day | ORAL | Status: DC
  Administered 2015-11-29 – 2015-12-09 (×11): 300 mg via ORAL
  Filled 2015-11-29 (×12): qty 1

## 2015-11-29 MED ORDER — WARFARIN SODIUM 5 MG PO TABS
5.0000 mg | ORAL_TABLET | Freq: Once | ORAL | Status: AC
  Administered 2015-11-29: 5 mg via ORAL
  Filled 2015-11-29: qty 1

## 2015-11-29 NOTE — Progress Notes (Signed)
Pt refuses CPAP.  RT will continue to monitor.  °

## 2015-11-29 NOTE — Plan of Care (Signed)
Problem: RH PAIN MANAGEMENT Goal: RH STG PAIN MANAGED AT OR BELOW PT'S PAIN GOAL Pain level 3/10 with min assist at discharge  Outcome: Not Progressing Rates pain as 7

## 2015-11-29 NOTE — Progress Notes (Signed)
Speech Language Pathology Daily Session Note  Patient Details  Name: Patrick [name] MRN: [id number] Date of Birth: [birth year]  Today's Date: 11/29/2015 SLP Individual Time: 0800-0840 SLP Individual Time Calculation (min): 40 min  Short Term Goals: Week 1: SLP Short Term Goal 1 (Week 1): Pt will recall basic, daily information with mod assist verbal cues for use of external aids.  SLP Short Term Goal 2 (Week 1): Pt will attend to basic, familiar tasks for 5-10 minute intervals with mod verbal cues for redirection.  SLP Short Term Goal 3 (Week 1): Pt will complete basic, familiar tasks with mod assist verbal cues for functional problem solving   Skilled Therapeutic Interventions:  Pt was seen for skilled ST targeting cognitive goals.  Pt appeared more lethargic today in comparison to most recent therapy sessions.  Pt typically is agreeable to getting out of bed for therapies but reported today that he did not want to.  Pt reported that he had a "rough night" but did not elaborate."  Pt was agreeable to participate in therapies while sitting upright in bed.  Pt required max assist verbal cues and more than a reasonable amount of time to initiate and sequence bed mobility.  SLP attempted to administer portions of the MoCA-basic to measure progress from initial evaluation; however, given fatigue and pt reports of feeling "sick" pt's participation was minimal.  Vital signs taken, BP 201/108, pulse 90, O2 100.  RN made aware.  SLP offered alternative therapeutic tasks to engage pt, he continued to decline to participate fully.  As a result, session was ended 20 minutes early due to illness and fatigue.  Pt left in bed with call bell within reach and quick release belt donned.  Continue per current plan of care.    Function:  Eating Eating                 Cognition Comprehension Comprehension assist level: Follows basic conversation/direction with extra time/assistive device  Expression    Expression assist level: Expresses basic 90% of the time/requires cueing < 10% of the time.  Social Interaction Social Interaction assist level: Interacts appropriately 75 - 89% of the time - Needs redirection for appropriate language or to initiate interaction.  Problem Solving Problem solving assist level: Solves basic 50 - 74% of the time/requires cueing 25 - 49% of the time  Memory Memory assist level: Recognizes or recalls 50 - 74% of the time/requires cueing 25 - 49% of the time    Pain Pain Assessment Pain Assessment: No/denies pain  Therapy/Group: Individual Therapy  Patrick [name], Selinda Orion 11/29/2015, 12:22 PM

## 2015-11-29 NOTE — Progress Notes (Signed)
Recreational Therapy Session Note  Patient Details  Name: Patrick [name] MRN: [id number] Date of Birth: 03-Jun-1946 Today's Date: 11/29/2015  Pain: no c/o Skilled Therapeutic Interventions/Progress Updates: met with pt to follow up on leisure interests.  Pt required max questioning cues and tactile cue to participate in conversation.  Pt stated stated he didn't do much PTA other than sit around and had little interest in TR.  Full eval deferred.  Lesiure interest form remains in the room for family completion.  Will continue to monitor during LOS.   [place] 11/29/2015, 3:21 PM

## 2015-11-29 NOTE — Plan of Care (Signed)
Problem: RH BLADDER ELIMINATION Goal: RH STG MANAGE BLADDER WITH ASSISTANCE STG Manage Bladder With Min Assistance  Outcome: Not Progressing Incontinent x 1  Problem: RH SKIN INTEGRITY Goal: RH STG SKIN FREE OF INFECTION/BREAKDOWN Free of skin breakdown min assist  Outcome: Not Progressing MASD affect to buttock and intergluteal fold

## 2015-11-29 NOTE — Progress Notes (Signed)
Subjective/Complaints: Eating better, still with poor po fluid intake No HA  ROS- denies any CP/SOB, N/V/D, +HA  Objective: Vital Signs: Blood pressure 191/96, pulse 67, temperature 98 F (36.7 C), temperature source Oral, resp. rate 18, height 5' 9.5" (1.765 m), weight 77.474 kg (170 lb 12.8 oz), SpO2 100 %. No results found. Results for orders placed or performed during the hospital encounter of 11/22/15 (from the past 72 hour(s))  Glucose, capillary     Status: Abnormal   Collection Time: 11/26/15 11:47 AM  Result Value Ref Range   Glucose-Capillary 209 (H) 65 - 99 mg/dL   Comment 1 Notify RN   Glucose, capillary     Status: Abnormal   Collection Time: 11/26/15  4:23 PM  Result Value Ref Range   Glucose-Capillary 172 (H) 65 - 99 mg/dL   Comment 1 Notify RN   Glucose, capillary     Status: Abnormal   Collection Time: 11/26/15  9:40 PM  Result Value Ref Range   Glucose-Capillary 210 (H) 65 - 99 mg/dL  Glucose, capillary     Status: Abnormal   Collection Time: 11/27/15  6:44 AM  Result Value Ref Range   Glucose-Capillary 172 (H) 65 - 99 mg/dL  Protime-INR     Status: Abnormal   Collection Time: 11/27/15  7:15 AM  Result Value Ref Range   Prothrombin Time 29.5 (H) 11.6 - 15.2 seconds   INR 2.85 (H) 0.00 - 1.49  Hemoglobin A1c     Status: Abnormal   Collection Time: 11/27/15  8:49 AM  Result Value Ref Range   Hgb A1c MFr Bld 8.5 (H) 4.8 - 5.6 %    Comment: (NOTE)         Pre-diabetes: 5.7 - 6.4         Diabetes: >6.4         Glycemic control for adults with diabetes: <7.0    Mean Plasma Glucose 197 mg/dL    Comment: (NOTE) Performed At: [facility] [place], [place] [id number] Lindon Romp MD [id number]   Glucose, capillary     Status: Abnormal   Collection Time: 11/27/15 11:23 AM  Result Value Ref Range   Glucose-Capillary 294 (H) 65 - 99 mg/dL   Comment 1 Notify RN   Glucose, capillary     Status: Abnormal   Collection Time:  11/27/15  4:27 PM  Result Value Ref Range   Glucose-Capillary 158 (H) 65 - 99 mg/dL   Comment 1 Notify RN   Glucose, capillary     Status: None   Collection Time: 11/27/15  9:01 PM  Result Value Ref Range   Glucose-Capillary 86 65 - 99 mg/dL  Protime-INR     Status: Abnormal   Collection Time: 11/28/15  6:05 AM  Result Value Ref Range   Prothrombin Time 28.3 (H) 11.6 - 15.2 seconds   INR 2.70 (H) 0.00 - 1.49  Glucose, capillary     Status: Abnormal   Collection Time: 11/28/15  6:51 AM  Result Value Ref Range   Glucose-Capillary 117 (H) 65 - 99 mg/dL  Glucose, capillary     Status: Abnormal   Collection Time: 11/28/15 11:45 AM  Result Value Ref Range   Glucose-Capillary 185 (H) 65 - 99 mg/dL   Comment 1 Notify RN   Glucose, capillary     Status: Abnormal   Collection Time: 11/28/15  4:50 PM  Result Value Ref Range   Glucose-Capillary 142 (H) 65 - 99 mg/dL  Comment 1 Notify RN   Glucose, capillary     Status: Abnormal   Collection Time: 11/28/15  9:08 PM  Result Value Ref Range   Glucose-Capillary 126 (H) 65 - 99 mg/dL  Protime-INR     Status: Abnormal   Collection Time: 11/29/15  4:43 AM  Result Value Ref Range   Prothrombin Time 23.6 (H) 11.6 - 15.2 seconds   INR 2.12 (H) 0.00 - 1.49  Glucose, capillary     Status: None   Collection Time: 11/29/15  6:49 AM  Result Value Ref Range   Glucose-Capillary 90 65 - 99 mg/dL     HEENT: normal Cardio: RRR and no murmur Resp: CTA B/L and unlabored GI: BS positive and NT, ND GU:  sm amt meatal blood mixed with urine Extremity:  Pulses positive and No Edema Skin:   Intact Neuro: Alert/Oriented, Normal Sensory, Abnormal Motor 4/5 in RUE , 2- RLE, 5/5 on L side, Abnormal FMC Ataxic/ dec FMC and Dysarthric Musc/Skel:  Other no pain with UE or LE ROM, no joint swelling Gen NAD   Assessment/Plan: 1. Functional deficits secondary to bilateral posterior frontal and parietal infarct with R LE> UE paresis which require 3+ hours per  day of interdisciplinary therapy in a comprehensive inpatient rehab setting. Physiatrist is providing close team supervision and 24 hour management of active medical problems listed below. Physiatrist and rehab team continue to assess barriers to discharge/monitor patient progress toward functional and medical goals. FIM: Function - Bathing Position: Sitting EOB Body parts bathed by patient: Right arm, Left arm, Chest, Abdomen, Front perineal area, Right upper leg, Left upper leg, Buttocks Body parts bathed by helper: Right lower leg, Left lower leg, Back Assist Level:  (min A)  Function- Upper Body Dressing/Undressing What is the patient wearing?: Hospital gown Pull over shirt/dress - Perfomed by patient: Thread/unthread right sleeve, Thread/unthread left sleeve, Put head through opening, Pull shirt over trunk Pull over shirt/dress - Perfomed by helper: Put head through opening, Pull shirt over trunk Assist Level: Set up, Supervision or verbal cues Set up : To obtain clothing/put away Function - Lower Body Dressing/Undressing What is the patient wearing?: Non-skid slipper socks Position: Wheelchair/chair at sink Underwear - Performed by patient: Thread/unthread right underwear leg Underwear - Performed by helper: Thread/unthread left underwear leg, Pull underwear up/down Pants- Performed by patient: Thread/unthread left pants leg Pants- Performed by helper: Thread/unthread right pants leg, Thread/unthread left pants leg, Pull pants up/down Non-skid slipper socks- Performed by patient: Don/doff right sock Non-skid slipper socks- Performed by helper: Don/doff left sock TED Hose - Performed by helper: Don/doff right TED hose, Don/doff left TED hose Assist for footwear: Partial/moderate assist Assist for lower body dressing: Touching or steadying assistance (Pt > 75%)  Function - Toileting Toileting steps completed by helper: Adjust clothing prior to toileting, Performs perineal hygiene,  Adjust clothing after toileting (per Malachi Carl, NT) Assist level: Two helpers (per [employer], NT)  Function - Air cabin crew transfer assistive device: Bedside commode Assist level to bedside commode (at bedside): 2 helpers Assist level from bedside commode (at bedside): 2 helpers  Function - Chair/bed transfer Chair/bed transfer method: Squat pivot Chair/bed transfer assist level: Maximal assist (Pt 25 - 49%/lift and lower) Chair/bed transfer assistive device: Armrests Chair/bed transfer details: Verbal cues for technique, Verbal cues for precautions/safety, Verbal cues for sequencing, Manual facilitation for placement, Manual facilitation for weight bearing, Manual facilitation for weight shifting, Tactile cues for placement  Function - Locomotion: Wheelchair Will  patient use wheelchair at discharge?: Yes Type: Manual Max wheelchair distance: 30 Assist Level: Moderate assistance (Pt 50 - 74%) Wheel 50 feet with 2 turns activity did not occur: Safety/medical concerns (lethargy,fatigue) Wheel 150 feet activity did not occur: Safety/medical concerns (lethargy, fatigue) Turns around,maneuvers to table,bed, and toilet,negotiates 3% grade,maneuvers on rugs and over doorsills: No Function - Locomotion: Ambulation Ambulation activity did not occur: Safety/medical concerns (unsafe due to knee buckling) Assistive device: Parallel bars Max distance: 2 Assist level: Total assist (Pt < 25%)  Function - Comprehension Comprehension: Auditory Comprehension assistive device: Hearing aids Comprehension assist level: Follows basic conversation/direction with extra time/assistive device  Function - Expression Expression: Verbal Expression assist level: Expresses basic needs/ideas: With extra time/assistive device  Function - Social Interaction Social Interaction assist level: Interacts appropriately 75 - 89% of the time - Needs redirection for appropriate language or to initiate  interaction.  Function - Problem Solving Problem solving assist level: Solves basic 25 - 49% of the time - needs direction more than half the time to initiate, plan or complete simple activities  Function - Memory Memory assist level: Recognizes or recalls 25 - 49% of the time/requires cueing 50 - 75% of the time Patient normally able to recall (first 3 days only): Current season, Staff names and faces, That he or she is in a hospital  Medical Problem List and Plan: 1.  Mobilty,cognitive and functional deficits secondary to bi-cerebral embolic CVA Team conf in am             -   2.  DVT Prophylaxis/Anticoagulation: Pharmaceutical: Coumadin              -pt therapeutic now INR 2.12 without gross  hematuria 3. Pain Management:  T#3 prn for headache, d/c tramadol, if not better as BP normalizes will trial topamax       4. Mood: team to provide ego support 5. Neuropsych: This patient is not capable of making decisions on his own behalf--question of mild cognitive deficits at baseline.   6. Skin/Wound Care: routine pressure relief measures. Add supplements between meals.   7. Fluids/Electrolytes/Nutrition:  Monitor I/O. Meal intake ~100% also on Nepro, fluid intake ~734ml d/c nocturnal IVF 8. HTN: permissive hypertension with prn hydralazine 25mg  only for SBP >220 or DBP >120.             -holding  , hctz, lisinopril,   restarted catapres .3mg  TID, increase calanSR to 240 - will consider changing hydralazine to scheduled dose,further   Calan  may cause  bradycardia but to simplify regimen will increase and monitor Filed Vitals:   11/28/15 2026 11/29/15 0546  BP: 187/88 191/96  Pulse:  67  Temp:  98 F (36.7 C)  Resp:  18   9. A Fib: cardizem/warfarin. Mild RVR  HR 66 cont current dose calan- ? Pain related Monitor HR with exertion 10 Hyponatremia: mild. Encourage appropriate nutrition 11. CKD: Cr 3.37 today.cont IVF Follow i's and o's, repeat BMET stable creat sl better   12. H/o gout:  allopurinol d/c due to increased creat, no flares 13. Gastric ulcers/GIB:  Protonix, monitor any bleeding while on warfarin             -hgb 13.9 14.  Hematuria without dysuria,  UA neg except hematuria, C and S multispecies 15.  Elevated CBG, check Hgb A1C start amaryl CBG (last 3)   Recent Labs  11/28/15 1650 11/28/15 2108 11/29/15 0649  GLUCAP 142* 126* 90  LOS (Days) 7 A FACE TO FACE EVALUATION WAS PERFORMED  Janisse Ghan E 11/29/2015, 7:15 AM

## 2015-11-29 NOTE — Progress Notes (Signed)
Physical Therapy Session Note  Patient Details  Name: Patrick [name] MRN: [id number] Date of Birth: August 27, 1945  Today's Date: 11/29/2015 PT Individual Time: 6546 (18 minutes make up minutes)-1523 PT Individual Time Calculation (min): 68 min   Short Term Goals: Week 1:  PT Short Term Goal 1 (Week 1): pt will perform supine> sit with min assist PT Short Term Goal 1 - Progress (Week 1): Not met PT Short Term Goal 2 (Week 1): pt will transfer bed>< w/c wiht mod assist PT Short Term Goal 2 - Progress (Week 1): Not met PT Short Term Goal 3 (Week 1): pt will propel w/c x 50' with min assist PT Short Term Goal 3 - Progress (Week 1): Not met PT Short Term Goal 4 (Week 1): pt will perform gait x 15' with 2 helpers PT Short Term Goal 4 - Progress (Week 1): Partly met  Skilled Therapeutic Interventions/Progress Updates:   Pt received in w/c with family present.  Pt continues to be very lethargic with flat affect, slow to respond and initiate but willing to participate.  Performed w/c mobility x 50' with bilat UE propulsion to focus on use of RUE, coordination and sequencing.  Required mod hand over hand assistance on R side for full ROM during propulsion.  Performed transfer w/c <> Nustep squat pivot with mod-max A with therapist providing facilitation and cuing pt to reach for surface transferring to to facilitate full anterior and lateral leans and weight shifting during pivoting.  Performed bilat UE and LE NMR, coordination, activation training and strengthening on Nustep at level 4 resistance x 3 min + 3 min with one break to assess BP and HR and due to fatigue.  Required assistance to maintain hip IR during alternating stepping.  Attempted to perform sit > stand and gait with bilat UE support on RW but pt unable to maintain upright posture.  Returned to w/c and transitioned to hallway where pt performed NMR during gait with LUE support on wall rail and RUE over therapist with therapist providing facilitation  at trunk and pelvis for upright trunk, trunk elongation on L with L lateral weight shifting, initiation of R swing and approximation at hip and knee on R during stance-no episodes of buckling noted during gait x 25'. Continued NMR/gait training with focus on automatic initiation of R swing phase with use of kicking a block x 5-6' with therapist providing facilitation at pelvis for lateral weight shift, pelvic rotation to initiate swing; pt able to initiate knee extension to finish swing phase.  Returned to w/c and to room where pt requested to urinate.  Pt stood at sink for prolonged period with bilat UE support on sink while therapist provided max A on R and positioned, held, removed urinal.  Pt transferred w/c > bed squat pivot max A and from sit > sidelying > supine with min-mod A.  Pt left in bed with family present and all items within reach.  Therapy Documentation Precautions:  Precautions Precautions: Fall Precaution Comments: RLE hemiparesis Restrictions Weight Bearing Restrictions: No Vital Signs: Therapy Vitals Temp: 97.7 F (36.5 C) Temp Source: Oral Pulse Rate: 67 Resp: 18 BP: (!) 190/84 mmHg Patient Position (if appropriate): Standing Oxygen Therapy SpO2: 100 % O2 Device: Not Delivered Pain: Pain Assessment Pain Assessment: No/denies pain Other Treatments: Treatments Neuromuscular Facilitation: Right;Upper Extremity;Lower Extremity;Forced use;Activity to increase coordination;Activity to increase motor control;Activity to increase timing and sequencing;Activity to increase sustained activation;Activity to increase lateral weight shifting;Activity to increase anterior-posterior weight shifting  See Function Navigator for Current Functional Status.   Therapy/Group: Individual Therapy  Raylene Everts [facility] 11/29/2015, 4:25 PM

## 2015-11-29 NOTE — Progress Notes (Signed)
Occupational Therapy Session Note  Patient Details  Name: Kishore Werthman MRN: [id number] Date of Birth: Jan 01, 1946  Today's Date: 11/29/2015 OT Individual Time: [id number] OT Individual Time Calculation (min): 90 min    Short Term Goals: Week 1:  OT Short Term Goal 1 (Week 1): Pt will complete UB bathing with supervison sitting unsupported EOM or EOB.   OT Short Term Goal 2 (Week 1): Pt will perform LB bathing sit to stand with mod assist 2 consecutive sessions. OT Short Term Goal 3 (Week 1): Pt will donn pullover shirt with supervison in unsupported sitting. OT Short Term Goal 4 (Week 1): Pt will complete stand pivot transfers with LRAD and mod assist to the 3:1 or shower bench.  OT Short Term Goal 5 (Week 1): Pt will tolerate 30 mins B/D session with no more than 2 rest breaks.   Skilled Therapeutic Interventions/Progress Updates:    Session began with pt transferring to EOB with mod assist.  Note pt's bed wet from bladder incontinence or accident.  Pt just receiving enema from nursing so completed transfer to the [facility] with max assist squat pivot.  Pt with decreased ability to stand upright and move LEs, instead reaching for far arm of the [facility] and attempting to turn his hips.  Max assist for toilet hygiene in sitting before transferring to the wheelchair with max assist as well.  Pt still fatigues easily but slightly better endurance during ADL.  He was able to wash all of his UB and his upper legs before needing a rest break.  He was able to stand for washing peri area with max assist.  Mod facilitation at the right knee and hip secondary to flexion in standing.  He needed assistance for crossing LEs during LB bathing and dressing, max on the right and min on the left.  Mod instructional cueing for hemidressing techniques.  Pt declined oral hygiene this session.  Left in wheelchair beside of the bed with call button and phone in reach.  Safety belt in place.       Therapy Documentation Precautions:   Precautions Precautions: Fall Precaution Comments: RLE hemiparesis Restrictions Weight Bearing Restrictions: No Vital Signs: Therapy Vitals Pulse Rate: 79 BP: (!) 201/83 mmHg Patient Position (if appropriate): Lying Pain: Pain Assessment Pain Assessment: No/denies pain ADL: See Function Navigator for Current Functional Status.   Therapy/Group: Individual Therapy  Alitzel Cookson OTR/L 11/29/2015, 10:47 AM

## 2015-11-29 NOTE — Progress Notes (Signed)
ANTICOAGULATION CONSULT NOTE - Follow Up Consult  Pharmacy Consult for coumadin Indication: atrial fibrillation  No Known Allergies  Patient Measurements: Height: 5' 9.5" (176.5 cm) Weight: 170 lb 12.8 oz (77.474 kg) IBW/kg (Calculated) : 71.85 Heparin Dosing Weight:   Vital Signs: Temp: 98 F (36.7 C) (05/02 0546) Temp Source: Oral (05/02 0546) BP: 220/118 mmHg (05/02 0751) Pulse Rate: 86 (05/02 0751)  Labs:  Recent Labs  11/27/15 0715 11/28/15 0605 11/29/15 0443  LABPROT 29.5* 28.3* 23.6*  INR 2.85* 2.70* 2.12*    Estimated Creatinine Clearance: 22.9 mL/min (by C-G formula based on Cr of 3.19).   Medications:  Scheduled:  . aspirin EC  81 mg Oral Daily  . cloNIDine  0.3 mg Oral TID  . diclofenac sodium  4 g Topical QID  . diltiazem  300 mg Oral Daily  . feeding supplement (NEPRO CARB STEADY)  237 mL Oral TID BM  . glimepiride  1 mg Oral Q breakfast  . insulin aspart  0-20 Units Subcutaneous TID WC  . insulin aspart  0-5 Units Subcutaneous QHS  . polyethylene glycol  17 g Oral Daily  . Warfarin - Pharmacist Dosing Inpatient   Does not apply q1800   Infusions:    Assessment: 70 yo male with afib is currently on therapeutic coumadin but INR is down to 2.12 from 2.7.  Goal of Therapy:  INR 2-3 Monitor platelets by anticoagulation protocol: Yes   Plan:  - coumadin 5mg  po x1 - INR in am  Chrys Landgrebe, Tsz-Yin 11/29/2015,8:16 AM

## 2015-11-29 NOTE — Progress Notes (Signed)
Occupational Therapy Weekly Progress Note  Patient Details  Name: Patrick [name] MRN: [id number] Date of Birth: 11-07-1945  Beginning of progress report period: November 23, 2015 End of progress report period: Nov 29, 2015   Patient has met 0 of 5 short term goals.  He continues to need max assist for LB selfcare sit to stand as well as min to mod assist for dynamic sitting balance during UB selfcare.  He is able to perform UB selfcare with supervision supported however.  Max assist for sit to stand and standing balance and for all transfers stand pivot.  Patrick [name] needs mod assist for maintaining right knee extension in standing as well as for crossing the RLE over the left knee for washing or dressing tasks.  Endurance continues to be a factor as well, with Patrick [name] needing 4-5 rest breaks in a 30 minute span of participating bathing and dressing tasks.  RUE functional use is good with current function at a diminished level for ADLs.  Feel his progress is slower than originally expected but supervision to min assist goals are still attainable over the next 2-2 1/2 weeks. Will continue with current OT treatment plan at this time.     Patient continues to demonstrate the following deficits: decreased balance, decreased endurance, decreased RUE coordination and strength, decreased attention, and therefore will continue to benefit from skilled OT intervention to enhance overall performance with BADL and iADL.  Patient progressing toward long term goals..  Continue plan of care.  OT Short Term Goals Week 2:  OT Short Term Goal 1 (Week 2): Pt will complete UB bathing with supervison sitting unsupported EOM or EOB.   OT Short Term Goal 2 (Week 2): Pt will perform LB bathing sit to stand with mod assist 2 consecutive sessions. OT Short Term Goal 3 (Week 2): Pt will donn pullover shirt with supervison in unsupported sitting. OT Short Term Goal 4 (Week 2): Pt will complete stand pivot transfers with LRAD and  mod assist to the 3:1 or shower bench.  OT Short Term Goal 5 (Week 2): Pt will tolerate 30 mins B/D session with no more than 2 rest breaks.    Therapy Documentation Precautions:  Precautions Precautions: Fall Precaution Comments: RLE hemiparesis Restrictions Weight Bearing Restrictions: No General:   Vital Signs: Therapy Vitals Temp: 97.7 F (36.5 C) Temp Source: Oral Pulse Rate: 66 Resp: 18 BP: (!) 162/95 mmHg Patient Position (if appropriate): Sitting Oxygen Therapy SpO2: 100 % O2 Device: Not Delivered Pain: Pain Assessment Pain Assessment: No/denies pain ADL: See Function Navigator for Current Functional Status.   Therapy/Group: Individual Therapy  Clare Casto OTR/L 11/29/2015, 4:09 PM

## 2015-11-30 ENCOUNTER — Inpatient Hospital Stay (HOSPITAL_COMMUNITY): Payer: Medicare Other

## 2015-11-30 ENCOUNTER — Inpatient Hospital Stay (HOSPITAL_COMMUNITY): Payer: Medicare Other | Admitting: Speech Pathology

## 2015-11-30 ENCOUNTER — Inpatient Hospital Stay (HOSPITAL_COMMUNITY): Payer: Medicare Other | Admitting: Occupational Therapy

## 2015-11-30 DIAGNOSIS — I63139 Cerebral infarction due to embolism of unspecified carotid artery: Secondary | ICD-10-CM

## 2015-11-30 LAB — PROTIME-INR
INR: 2.08 — AB (ref 0.00–1.49)
PROTHROMBIN TIME: 23.2 s — AB (ref 11.6–15.2)

## 2015-11-30 LAB — GLUCOSE, CAPILLARY
GLUCOSE-CAPILLARY: 219 mg/dL — AB (ref 65–99)
Glucose-Capillary: 99 mg/dL (ref 65–99)
Glucose-Capillary: 172 mg/dL — ABNORMAL HIGH (ref 65–99)
Glucose-Capillary: 164 mg/dL — ABNORMAL HIGH (ref 65–99)

## 2015-11-30 MED ORDER — GLIMEPIRIDE 2 MG PO TABS
2.0000 mg | ORAL_TABLET | Freq: Every day | ORAL | Status: DC
  Administered 2015-12-01 – 2015-12-02 (×2): 2 mg via ORAL
  Filled 2015-11-30 (×2): qty 1

## 2015-11-30 MED ORDER — HYDRALAZINE HCL 10 MG PO TABS
10.0000 mg | ORAL_TABLET | Freq: Three times a day (TID) | ORAL | Status: DC
  Administered 2015-11-30 – 2015-12-03 (×9): 10 mg via ORAL
  Filled 2015-11-30 (×9): qty 1

## 2015-11-30 MED ORDER — WARFARIN SODIUM 5 MG PO TABS
5.0000 mg | ORAL_TABLET | Freq: Once | ORAL | Status: AC
  Administered 2015-11-30: 5 mg via ORAL
  Filled 2015-11-30: qty 1

## 2015-11-30 MED ORDER — SENNOSIDES-DOCUSATE SODIUM 8.6-50 MG PO TABS
2.0000 | ORAL_TABLET | Freq: Two times a day (BID) | ORAL | Status: DC
  Administered 2015-11-30 – 2015-12-08 (×12): 2 via ORAL
  Filled 2015-11-30 (×15): qty 2

## 2015-11-30 NOTE — Progress Notes (Signed)
Subjective/Complaints: Eating better, still with poor po fluid intake, son at bedside, discussed need to drink more fluid No HA  ROS- denies any CP/SOB, N/V/D, +HA  Objective: Vital Signs: Blood pressure 198/104, pulse 63, temperature 97.9 F (36.6 C), temperature source Oral, resp. rate 18, height 5' 9.5" (1.765 m), weight 76.204 kg (168 lb), SpO2 100 %. No results found. Results for orders placed or performed during the hospital encounter of 11/22/15 (from the past 72 hour(s))  Hemoglobin A1c     Status: Abnormal   Collection Time: 11/27/15  8:49 AM  Result Value Ref Range   Hgb A1c MFr Bld 8.5 (H) 4.8 - 5.6 %    Comment: (NOTE)         Pre-diabetes: 5.7 - 6.4         Diabetes: >6.4         Glycemic control for adults with diabetes: <7.0    Mean Plasma Glucose 197 mg/dL    Comment: (NOTE) Performed At: [facility] [place], [place] [id number] Lindon Romp MD OI:[id number]   Glucose, capillary     Status: Abnormal   Collection Time: 11/27/15 11:23 AM  Result Value Ref Range   Glucose-Capillary 294 (H) 65 - 99 mg/dL   Comment 1 Notify RN   Glucose, capillary     Status: Abnormal   Collection Time: 11/27/15  4:27 PM  Result Value Ref Range   Glucose-Capillary 158 (H) 65 - 99 mg/dL   Comment 1 Notify RN   Glucose, capillary     Status: None   Collection Time: 11/27/15  9:01 PM  Result Value Ref Range   Glucose-Capillary 86 65 - 99 mg/dL  Protime-INR     Status: Abnormal   Collection Time: 11/28/15  6:05 AM  Result Value Ref Range   Prothrombin Time 28.3 (H) 11.6 - 15.2 seconds   INR 2.70 (H) 0.00 - 1.49  Glucose, capillary     Status: Abnormal   Collection Time: 11/28/15  6:51 AM  Result Value Ref Range   Glucose-Capillary 117 (H) 65 - 99 mg/dL  Glucose, capillary     Status: Abnormal   Collection Time: 11/28/15 11:45 AM  Result Value Ref Range   Glucose-Capillary 185 (H) 65 - 99 mg/dL   Comment 1 Notify RN   Glucose, capillary      Status: Abnormal   Collection Time: 11/28/15  4:50 PM  Result Value Ref Range   Glucose-Capillary 142 (H) 65 - 99 mg/dL   Comment 1 Notify RN   Glucose, capillary     Status: Abnormal   Collection Time: 11/28/15  9:08 PM  Result Value Ref Range   Glucose-Capillary 126 (H) 65 - 99 mg/dL  Protime-INR     Status: Abnormal   Collection Time: 11/29/15  4:43 AM  Result Value Ref Range   Prothrombin Time 23.6 (H) 11.6 - 15.2 seconds   INR 2.12 (H) 0.00 - 1.49  Glucose, capillary     Status: None   Collection Time: 11/29/15  6:49 AM  Result Value Ref Range   Glucose-Capillary 90 65 - 99 mg/dL  Glucose, capillary     Status: Abnormal   Collection Time: 11/29/15 11:52 AM  Result Value Ref Range   Glucose-Capillary 190 (H) 65 - 99 mg/dL   Comment 1 Notify RN   Glucose, capillary     Status: Abnormal   Collection Time: 11/29/15  4:55 PM  Result Value Ref Range   Glucose-Capillary 187 (  H) 65 - 99 mg/dL   Comment 1 Notify RN   Glucose, capillary     Status: Abnormal   Collection Time: 11/29/15  8:23 PM  Result Value Ref Range   Glucose-Capillary 167 (H) 65 - 99 mg/dL  Protime-INR     Status: Abnormal   Collection Time: 11/30/15  5:30 AM  Result Value Ref Range   Prothrombin Time 23.2 (H) 11.6 - 15.2 seconds   INR 2.08 (H) 0.00 - 1.49  Glucose, capillary     Status: None   Collection Time: 11/30/15  6:40 AM  Result Value Ref Range   Glucose-Capillary 99 65 - 99 mg/dL     HEENT: normal Cardio: RRR and no murmur Resp: CTA B/L and unlabored GI: BS positive and NT, ND GU:  sm amt meatal blood mixed with urine Extremity:  Pulses positive and No Edema Skin:   Intact Neuro: Alert/Oriented, Normal Sensory, Abnormal Motor 4/5 in RUE , 2- RLE, 5/5 on L side, Abnormal FMC Ataxic/ dec FMC and Dysarthric Musc/Skel:  Other no pain with UE or LE ROM, no joint swelling Gen NAD   Assessment/Plan: 1. Functional deficits secondary to bilateral posterior frontal and parietal infarct with R LE>  UE paresis which require 3+ hours per day of interdisciplinary therapy in a comprehensive inpatient rehab setting. Physiatrist is providing close team supervision and 24 hour management of active medical problems listed below. Physiatrist and rehab team continue to assess barriers to discharge/monitor patient progress toward functional and medical goals. FIM: Function - Bathing Position: Wheelchair/chair at sink Body parts bathed by patient: Right arm, Left arm, Chest, Abdomen, Front perineal area, Right upper leg, Left upper leg, Left lower leg Body parts bathed by helper: Back, Right lower leg, Buttocks Assist Level:  (min A)  Function- Upper Body Dressing/Undressing What is the patient wearing?: Hospital gown Pull over shirt/dress - Perfomed by patient: Thread/unthread right sleeve, Thread/unthread left sleeve, Put head through opening Pull over shirt/dress - Perfomed by helper: Pull shirt over trunk Assist Level: Set up, Supervision or verbal cues Set up : To obtain clothing/put away Function - Lower Body Dressing/Undressing What is the patient wearing?: Non-skid slipper socks, Pants, Underwear, Socks Position: Wheelchair/chair at sink Underwear - Performed by patient: Thread/unthread left underwear leg Underwear - Performed by helper: Thread/unthread right underwear leg Pants- Performed by patient: Thread/unthread left pants leg Pants- Performed by helper: Pull pants up/down, Thread/unthread left pants leg Non-skid slipper socks- Performed by patient: Don/doff left sock Non-skid slipper socks- Performed by helper: Don/doff right sock TED Hose - Performed by helper: Don/doff right TED hose, Don/doff left TED hose Assist for footwear: Partial/moderate assist Assist for lower body dressing: Touching or steadying assistance (Pt > 75%)  Function - Toileting Toileting steps completed by helper: Adjust clothing prior to toileting, Performs perineal hygiene, Adjust clothing after  toileting Assist level: Two helpers (per [employer], NT)  Function - Air cabin crew transfer assistive device: Bedside commode Assist level to bedside commode (at bedside): Maximal assist (Pt 25 - 49%/lift and lower) Assist level from bedside commode (at bedside): Maximal assist (Pt 25 - 49%/lift and lower)  Function - Chair/bed transfer Chair/bed transfer method: Squat pivot Chair/bed transfer assist level: Maximal assist (Pt 25 - 49%/lift and lower) Chair/bed transfer assistive device: Armrests Chair/bed transfer details: Verbal cues for technique, Verbal cues for precautions/safety, Verbal cues for sequencing, Manual facilitation for placement, Manual facilitation for weight bearing, Manual facilitation for weight shifting, Tactile cues for placement  Function -  Locomotion: Wheelchair Will patient use wheelchair at discharge?: Yes Type: Manual Max wheelchair distance: 50 Assist Level: Moderate assistance (Pt 50 - 74%) Wheel 50 feet with 2 turns activity did not occur: Safety/medical concerns (lethargy,fatigue) Assist Level: Moderate assistance (Pt 50 - 74%) Wheel 150 feet activity did not occur: Safety/medical concerns (lethargy, fatigue) Turns around,maneuvers to table,bed, and toilet,negotiates 3% grade,maneuvers on rugs and over doorsills: No Function - Locomotion: Ambulation Ambulation activity did not occur: Safety/medical concerns (unsafe due to knee buckling) Assistive device: Rail in hallway Max distance: 25 Assist level: Maximal assist (Pt 25 - 49%) Assist level: Maximal assist (Pt 25 - 49%)  Function - Comprehension Comprehension: Auditory Comprehension assistive device: Hearing aids Comprehension assist level: Understands basic 90% of the time/cues < 10% of the time  Function - Expression Expression: Verbal Expression assist level: Expresses basic 75 - 89% of the time/requires cueing 10 - 24% of the time. Needs helper to occlude trach/needs to repeat  words.  Function - Social Interaction Social Interaction assist level: Interacts appropriately 75 - 89% of the time - Needs redirection for appropriate language or to initiate interaction.  Function - Problem Solving Problem solving assist level: Solves basic 75 - 89% of the time/requires cueing 10 - 24% of the time  Function - Memory Memory assist level: Recognizes or recalls 50 - 74% of the time/requires cueing 25 - 49% of the time Patient normally able to recall (first 3 days only): Current season  Medical Problem List and Plan: 1.  Mobilty,cognitive and functional deficits secondary to bi-cerebral embolic CVA Team conference today please see physician documentation under team conference tab, met with team face-to-face to discuss problems,progress, and goals. Formulized individual treatment plan based on medical history, underlying problem and comorbidities.             -   2.  DVT Prophylaxis/Anticoagulation: Pharmaceutical: Coumadin              -pt therapeutic now INR 2.08 without gross  hematuria 3. Pain Management:  T#3 prn for headache, d/c tramadol, if not better as BP normalizes will trial topamax       4. Mood: team to provide ego support 5. Neuropsych: This patient is not capable of making decisions on his own behalf--question of mild cognitive deficits at baseline.   6. Skin/Wound Care: routine pressure relief measures. Add supplements between meals.   7. Fluids/Electrolytes/Nutrition:  Monitor I/O. Meal intake ~100% also on Nepro, fluid intake ~337m,  off IVF, recheck BMET 8. HTN: permissive hypertension with prn hydralazine 216monly for SBP >220 or DBP >120.             -holding  , hctz, lisinopril,   restarted catapres .52m70mID, increase calanSR to 240 - will start 44m35mdralazine TID scheduled dose,  Calan  may cause  bradycardia but to simplify regimen will increase and monitor Filed Vitals:   11/29/15 2051 11/30/15 0524  BP: 199/88 198/104  Pulse:  63  Temp:  97.9  F (36.6 C)  Resp:  18   9. A Fib: cardizem/warfarin. Mild RVR  HR 63 cont current dose calan- ? Pain related Monitor HR with exertion 10 Hyponatremia: mild. Encourage appropriate nutrition 11. CKD: Cr 3.37 today.cont IVF Follow i's and o's, repeat BMET stable creat sl better   12. H/o gout: allopurinol d/c due to increased creat, no flares 13. Gastric ulcers/GIB:  Protonix, monitor any bleeding while on warfarin             -  hgb 13.9 14.  Hematuria without dysuria,  UA neg except hematuria, C and S multispecies 15.  Elevated CBG, check Hgb A1C start amaryl CBG (last 3)   Recent Labs  11/29/15 1655 11/29/15 2023 11/30/15 0640  GLUCAP 187* 167* 99    LOS (Days) 8 A FACE TO FACE EVALUATION WAS PERFORMED  Kyanna Mahrt E 11/30/2015, 7:54 AM

## 2015-11-30 NOTE — Progress Notes (Signed)
Physical Therapy Session Note  Patient Details  Name: Patrick [name] MRN: [id number] Date of Birth: 10/31/1945  Today's Date: 11/30/2015 PT Individual Time: 1500-1600 - , 60 min    Short Term Goals: Week 1:  PT Short Term Goal 1 (Week 1): pt will perform supine> sit with min assist Week 2:  PT Short Term Goal 1 (Week 2): pt will move supine> sit with min assist PT Short Term Goal 2 (Week 2): pt will transfer to L and R with min assist PT Short Term Goal 3 (Week 2): pt will propel w/c x 50' with 2 turns wiht min assist PT Short Term Goal 4 (Week 2): pt will perform gait x 25' with 1 helper  Skilled Therapeutic Interventions/Progress Updates:  Pt donned socks and shoes with assistance in sitting.  W/c> NuStep squat pivot to L with min assist, with cues to move buttocks.  neuromuscular re-education via forced use, VCs, visual cues for reciprocating alternating movement x 4 extremities, x 8 minutes.  Strap necessary around bil thighs to prevent flaccid position of R hip in ER. Sustained stretch R hamstrings in sitting with LE propped on footstool.  Gait with hallway rail x 25' with RAFO on, shoe cover on R shoe to decrease friction.  Max assist needed for forward progression RLE, upright trunk, forward gaze, RLE stability.  Pt left resting in w/c with quick release belt donned, and all needs within reach.    Therapy Documentation Precautions:  Precautions Precautions: Fall Precaution Comments: RLE hemiparesis Restrictions Weight Bearing Restrictions: No   Pain: Pain Assessment Pain Assessment: No/denies pain      See Function Navigator for Current Functional Status.   Therapy/Group: Individual Therapy  Toy Samarin 11/30/2015, 4:17 PM

## 2015-11-30 NOTE — Progress Notes (Signed)
Speech Language Pathology Daily Session Note  Patient Details  Name: Patrick [name] MRN: [id number] Date of Birth: [birth year]  Today's Date: 11/30/2015 SLP Individual Time: 0803-0900 SLP Individual Time Calculation (min): 57 min  Short Term Goals: Week 1: SLP Short Term Goal 1 (Week 1): Pt will recall basic, daily information with mod assist verbal cues for use of external aids.  SLP Short Term Goal 2 (Week 1): Pt will attend to basic, familiar tasks for 5-10 minute intervals with mod verbal cues for redirection.  SLP Short Term Goal 3 (Week 1): Pt will complete basic, familiar tasks with mod assist verbal cues for functional problem solving   Skilled Therapeutic Interventions: Pt participated with skilled SLP therapy focusing on cognitive goals. Pt exhibited a flat affect throughout the session, but was participative with encouragement. Pt required moderate verbal cueing to initiate functional tasks such as eating and participating in transfer. Pt participated in functional money management task with min verbal encouragement. Pt able to demonstrate money counting with 80% acc mod I for increased time, but had increased difficulty selecting a prescribed amount- mod A for this activity. Pt able to attend to 20 minute activity with min A.   Function:  Eating Eating                 Cognition Comprehension Comprehension assist level: Understands basic 90% of the time/cues < 10% of the time  Expression   Expression assist level: Expresses basic 90% of the time/requires cueing < 10% of the time.  Social Interaction Social Interaction assist level: Interacts appropriately 75 - 89% of the time - Needs redirection for appropriate language or to initiate interaction.  Problem Solving Problem solving assist level: Solves basic 75 - 89% of the time/requires cueing 10 - 24% of the time  Memory Memory assist level: Recognizes or recalls 50 - 74% of the time/requires cueing 25 - 49% of the time     Pain Pain Assessment Pain Assessment: No/denies pain  Therapy/Group: Individual Therapy  Vinetta Bergamo MA, CCC-SLP 11/30/2015, 10:22 AM

## 2015-11-30 NOTE — Progress Notes (Signed)
Occupational Therapy Session Note  Patient Details  Name: Patrick [name] MRN: [id number] Date of Birth: 1945/10/02  Today's Date: 11/30/2015 OT Individual Time: 1300-1400 OT Individual Time Calculation (min): 60 min    Short Term Goals: Week 2:  OT Short Term Goal 1 (Week 2): Pt will complete UB bathing with supervison sitting unsupported EOM or EOB.   OT Short Term Goal 2 (Week 2): Pt will perform LB bathing sit to stand with mod assist 2 consecutive sessions. OT Short Term Goal 3 (Week 2): Pt will donn pullover shirt with supervison in unsupported sitting. OT Short Term Goal 4 (Week 2): Pt will complete stand pivot transfers with LRAD and mod assist to the 3:1 or shower bench.  OT Short Term Goal 5 (Week 2): Pt will tolerate 30 mins B/D session with no more than 2 rest breaks.   Skilled Therapeutic Interventions/Progress Updates:    Pt worked on wheelchair mobility to the gym for increased neuromuscular re-education to the RUE.  Increased rest breaks needed to complete mobility to the nurses station using just his UEs.  Increased turning to right as well secondary to decreased use of the RUE.  Transferred to the mat with use of the RW and mod assist.  Max facilitation needed to advance the RLE during transfer as he could not advance it.  Worked on lateral reaches and weightshifts with use of the RUE while picking up and placing clothespins.  Mod demonstrational cueing to maintain upright posture and cervical extension to neutral.  Incorporated sit to stand and standing balance for task as well.  Max assist for sit to stand with increased weightbearing on the RLE.  Max assist needed to achieve full right knee and hip extension in standing as well.  Pt tends to use his UEs on the bedside table for support when attempting to stand.  Returned to wheelchair at end of session and returned to room.  Pt left in chair with SLP for next session.  Educated her to apply waist belt for safety at the completion of  her treatment.  [name] [name] present for session and therapist discussed results of conference with regards to expectations and progress.  They were both understanding that further treatment may be necessary beyond inpatient rehab stay.    Therapy Documentation Precautions:  Precautions Precautions: Fall Precaution Comments: RLE hemiparesis Restrictions Weight Bearing Restrictions: No  Pain: Pain Assessment Pain Assessment: No/denies pain ADL: See Function Navigator for Current Functional Status.   Therapy/Group: Individual Therapy  Vick Filter OTR/L 11/30/2015, 5:18 PM

## 2015-11-30 NOTE — Consult Note (Signed)
NEUROBEHAVIORAL STATUS EXAM - [facility]   MEDICAL NECESSITY:  Patrick [name] was seen on the [facility] for a neurobehavioral status exam owing to the patient's diagnosis of cerebral infarction, and to assist in treatment planning during admission.   Records indicate that Patrick [name] is a "70 y.o. male with history of CKD, HTN, A fib, DMT2, prior CVAs with cognitive deficits who was admitted on 11/18/15 with hypertensive emergency, HA, weakness and minor right sided weakness. He was admitted due to elevated BP and concerns of PRES. Patient had missed multiple BP doses due to wife being in the hospital. Work up showed acute on chronic renal insufficiency as well as INR -1.97. He was started on cardene drip and home medications resumed. CT head with chronic left thalamus infarct.  MRI brain negative. Carotid dopplers without significant ICA stenosis. 2 D echo with EF 40-45% and no wall abnormality.   Work up negative and neurology has signed off.  He had decline in mobility with inability to stand, RLE stability due to worsening of RLE weakness yesterday. Repeat MRI brain done showing interval development of scattered nonhemorrhagic infarcts medial aspect of posterior frontal and parietal lobes bilaterally, remote left thalamic infarcts, prior hemorrhagic infarct right cerebellar and posterior right temporal-occipital junction and global atrophy.  Blood pressures remain labile requiring IV hydralazine and Lisinopril increased for better control. Therapy ongoing and CIR recommended for follow up therapy. He was cleared medically for intensive rehab program."  During today's visit, Patrick [name] reported suffering from post-stroke memory loss that he feels was less problematic than the forgetfulness that occurred after his previous strokes. He denied any other cognitive changes. He did endorse mostly right-sided motor issues as well as fatigue,  headaches, and sore thighs. He then mentioned that he is also unable to pick up his right leg. Of note: patient was very slow to respond and he often needed clarification of questions. Patrick [name] cognitive issues were apparent. He additionally exhibited mild confusion and his affect was flat and depressed in appearance. Lastly, when he was reminded that his wife recently had a stroke, he at first seemed surprised like he had forgotten about it.    From an emotional standpoint, Patrick [name] denied having any history of mental health issues or need for treatment. He had a hard time describing his current mood but with assistance he could convey that he feels indifferent about his situation and "not happy, not mad." He said that others may see him has depressed. No adjustment issues endorsed. Suicidal/homicidal ideation, plan or intent was denied. No manic or hypomanic episodes were reported. The patient denied ever experiencing any auditory/visual hallucinations.    Patrick [name] believes that he is making progress in therapy. He had no complaints about the rehab staff. Cognitive issues and apathy may be a barrier to therapy. He presently lives with his wife and it was difficult to determine how much assistance she was providing given that the patient is a poor historian. He did say that he has his wife and family to support him.   PROCEDURES: [2 units 96116] Diagnostic clinical interview  Review of available records [place]'s Cognitive Examination - MINI  Test Results: Results from brief mental status exam suggest the presence of severe cognitive deficits at the level of dementia. He was generally oriented to time, but learning and memory were severely impaired, and there were indications of decreased attention and slowed processing speed. Additionally, mild expressive dysphasia was observed.  IMPRESSION: Overall, Patrick [name] endorsed experiencing mild memory loss post-stroke, though neurobehavioral status  examination suggests more severe deficits involving multiple cognitive domains (at the level of dementia) with cerebrovascular compromise being the most probable etiology. Emotionally, he appears depressed with severely flat affect but the patient denied overt depression and anxiety, describing his mood as indifferent. This could be post-stroke behavioral change manifesting as apathy. Unfortunately, this is associated with worse cognitive dysfunction, as well as worse outcome of rehabilitation. As such, I recommend 24/7 supervision upon discharge. And while there is currently insufficient evidence to support a specific pharmacological approach to post-stroke apathy, nootropics have shown some positive effect (e.g., donepezil). Review of the literature also shows a number of case reports that describe a beneficial effect of dopamine agonists (e.g., ropinirole) and methylphenidate.    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  Clinical Neuropsychologist

## 2015-11-30 NOTE — Progress Notes (Signed)
ANTICOAGULATION CONSULT NOTE - Follow Up Consult  Pharmacy Consult for Coumadin Indication: atrial fibrillation  No Known Allergies  Patient Measurements: Height: 5' 9.5" (176.5 cm) Weight: 168 lb (76.204 kg) IBW/kg (Calculated) : 71.85 Heparin Dosing Weight:   Vital Signs: Temp: 97.6 F (36.4 C) (05/03 1455) Temp Source: Oral (05/03 1455) BP: 149/65 mmHg (05/03 1455) Pulse Rate: 48 (05/03 1455)  Labs:  Recent Labs  11/28/15 0605 11/29/15 0443 11/30/15 0530  LABPROT 28.3* 23.6* 23.2*  INR 2.70* 2.12* 2.08*    Estimated Creatinine Clearance: 22.9 mL/min (by C-G formula based on Cr of 3.19).   Medications:  Scheduled:  . aspirin EC  81 mg Oral Daily  . cloNIDine  0.3 mg Oral TID  . diclofenac sodium  4 g Topical QID  . diltiazem  300 mg Oral Daily  . feeding supplement (NEPRO CARB STEADY)  237 mL Oral TID BM  . [START ON 12/01/2015] glimepiride  2 mg Oral Q breakfast  . hydrALAZINE  10 mg Oral Q8H  . insulin aspart  0-20 Units Subcutaneous TID WC  . insulin aspart  0-5 Units Subcutaneous QHS  . polyethylene glycol  17 g Oral Daily  . senna-docusate  2 tablet Oral BID  . Warfarin - Pharmacist Dosing Inpatient   Does not apply q1800    Assessment: 69yo with AFib on Coumadin.  INR has been therapeutic but continues to drift down; dose was boosted on 5/2.  No bleeding noted except for hematuria without dysuria over last several progress notes.  No visible blood noted per Dr.Kirsteins.  Goal of Therapy:  INR 2-3 Monitor platelets by anticoagulation protocol: Yes   Plan:  Coumadin 5mg  today Daily INR Watch for s/s of bleeding  Gracy Bruins, PharmD [facility]

## 2015-11-30 NOTE — Progress Notes (Signed)
Speech Language Pathology Daily Session Note  Patient Details  Name: Patrick [name] MRN: [id number] Date of Birth: [birth year]  Today's Date: 11/30/2015 SLP Individual Time: 1133-1200 SLP Individual Time Calculation (min): 27 min  Short Term Goals: Week 1: SLP Short Term Goal 1 (Week 1): Pt will recall basic, daily information with mod assist verbal cues for use of external aids.  SLP Short Term Goal 2 (Week 1): Pt will attend to basic, familiar tasks for 5-10 minute intervals with mod verbal cues for redirection.  SLP Short Term Goal 3 (Week 1): Pt will complete basic, familiar tasks with mod assist verbal cues for functional problem solving   Skilled Therapeutic Interventions: Pt was seen for skilled ST targeting cognitive goals.  SLP facilitated the session with a novel card game targeting functional problem solving and recall of new information.  Pt required min assist faded to supervision verbal cues to plan and execute a problem solving strategy during the abovementioned task.  Pt also required mod assist question cues to recall 3 rules of game after 15 and 25 minute delays.  Pt sustained his attention to task for its duration (25 minutes) with supervision.   Discussed with pt's son current goals and cognitive limitations.  Son reports no significant changes from baseline and that pt needed to have information repeated back to him multiple times before recalling it prior to recent CVA.  Pt left in wheelchair with son at bedside.  Continue per current plan of care.    Function:  Eating Eating                 Cognition Comprehension Comprehension assist level: Follows basic conversation/direction with extra time/assistive device  Expression   Expression assist level: Expresses basic needs/ideas: With extra time/assistive device  Social Interaction Social Interaction assist level: Interacts appropriately 75 - 89% of the time - Needs redirection for appropriate language or to initiate  interaction.  Problem Solving Problem solving assist level: Solves basic 75 - 89% of the time/requires cueing 10 - 24% of the time  Memory Memory assist level: Recognizes or recalls 50 - 74% of the time/requires cueing 25 - 49% of the time    Pain Pain Assessment Pain Assessment: No/denies pain  Therapy/Group: Individual Therapy  Jennise Both, Selinda Orion 11/30/2015, 12:48 PM

## 2015-11-30 NOTE — Progress Notes (Signed)
Speech Language Pathology Daily Session Note  Patient Details  Name: Patrick [name] MRN: [id number] Date of Birth: 06-14-1946  Today's Date: 11/30/2015 SLP Individual Time: 1400-1416 SLP Individual Time Calculation (min): 16 min Make Up Time  Short Term Goals: Week 1: SLP Short Term Goal 1 (Week 1): Pt will recall basic, daily information with mod assist verbal cues for use of external aids.  SLP Short Term Goal 2 (Week 1): Pt will attend to basic, familiar tasks for 5-10 minute intervals with mod verbal cues for redirection.  SLP Short Term Goal 3 (Week 1): Pt will complete basic, familiar tasks with mod assist verbal cues for functional problem solving   Skilled Therapeutic Interventions: Pt tired, but agreed to participate in SLP therapy to make up previously missed time. Pt wrote short note to his spouse with mod A for completeness, ex- left a letter out of his wife's name and left a word off "I love___"). Fully legible with the R hand. No assistance for writing. Able to attend to activitiy with mod verbal encouragement.   Function:  Eating Eating                 Cognition Comprehension Comprehension assist level: Follows basic conversation/direction with extra time/assistive device  Expression   Expression assist level: Expresses basic needs/ideas: With extra time/assistive device  Social Interaction Social Interaction assist level: Interacts appropriately 75 - 89% of the time - Needs redirection for appropriate language or to initiate interaction.  Problem Solving Problem solving assist level: Solves basic 75 - 89% of the time/requires cueing 10 - 24% of the time  Memory Memory assist level: Recognizes or recalls 50 - 74% of the time/requires cueing 25 - 49% of the time    Pain Pain Assessment Pain Assessment: No/denies pain  Therapy/Group: Individual Therapy  [place], CCC-SLP 11/30/2015, 2:28 PM

## 2015-12-01 ENCOUNTER — Inpatient Hospital Stay (HOSPITAL_COMMUNITY): Payer: Medicare Other | Admitting: Speech Pathology

## 2015-12-01 ENCOUNTER — Inpatient Hospital Stay (HOSPITAL_COMMUNITY): Payer: Medicare Other

## 2015-12-01 ENCOUNTER — Inpatient Hospital Stay (HOSPITAL_COMMUNITY): Payer: Medicare Other | Admitting: Occupational Therapy

## 2015-12-01 LAB — PROTIME-INR
INR: 2.17 — ABNORMAL HIGH (ref 0.00–1.49)
Prothrombin Time: 24 seconds — ABNORMAL HIGH (ref 11.6–15.2)

## 2015-12-01 LAB — GLUCOSE, CAPILLARY
GLUCOSE-CAPILLARY: 193 mg/dL — AB (ref 65–99)
GLUCOSE-CAPILLARY: 172 mg/dL — AB (ref 65–99)
GLUCOSE-CAPILLARY: 233 mg/dL — AB (ref 65–99)
Glucose-Capillary: 82 mg/dL (ref 65–99)

## 2015-12-01 MED ORDER — WARFARIN SODIUM 5 MG PO TABS
5.0000 mg | ORAL_TABLET | Freq: Once | ORAL | Status: AC
  Administered 2015-12-01: 5 mg via ORAL
  Filled 2015-12-01: qty 1

## 2015-12-01 MED ORDER — DONEPEZIL HCL 10 MG PO TABS
5.0000 mg | ORAL_TABLET | Freq: Every day | ORAL | Status: DC
  Administered 2015-12-01 – 2015-12-08 (×8): 5 mg via ORAL
  Filled 2015-12-01 (×8): qty 1

## 2015-12-01 NOTE — Progress Notes (Signed)
Physical Therapy Note  Patient Details  Name: Patrick [name] MRN: [id number] Date of Birth: 07-24-1946 Today's Date: 12/01/2015  1300-1415, 75 min individual tx  Pt sound asleep in bed but easily aroused.    neuromuscular re-education via manual cues, VCS, visual feedback for R hip flex/ext and abduction/adduction in L side lying, and R knee extension to target, in sitting.  No active motors felt during attempted knee or ankle movements initially, but improved with visual target and hand over hand assistance. In supine, assisted bil bridging and R straight leg raises.  Scooting forward in w/c using bil UEs with cues for forward lean to facilitate lifting hips. W/c propulsion using bil UEs with min assist for steering due to weakness RUE. Gait with [name] Pier walker x 20' with max assist and w/c follow, wearing R Reaction AFO, with assistance for R LE advancement, and for upright trunk and R knee control. Pt able to advance R foot partway for 1 step.  Bed mobility for rolling L and L sidelying> sit, with multimodal cues for sequencing and assistance for initiation movement. Sit> stand in [place], pulling up with bil hands, x 4.  Standing x 1 min, x 2 min 40 sec while placing matching cards on vertical board in front of him with R hand, with LUE support.  100% matching accuracy.  Pt left resting in room in w/c, with quick release belt donned, with all needs within reach.  Diesel Lina 12/01/2015, 7:55 AM

## 2015-12-01 NOTE — Progress Notes (Signed)
ANTICOAGULATION CONSULT NOTE - Follow Up Consult  Pharmacy Consult for Coumadin Indication: atrial fibrillation  No Known Allergies  Patient Measurements: Height: 5' 9.5" (176.5 cm) Weight: 170 lb 6.4 oz (77.293 kg) IBW/kg (Calculated) : 71.85 Heparin Dosing Weight:   Vital Signs: Temp: 97.5 F (36.4 C) (05/04 0513) Temp Source: Oral (05/04 0513) BP: 194/95 mmHg (05/04 0513) Pulse Rate: 64 (05/04 0513)  Labs:  Recent Labs  11/29/15 0443 11/30/15 0530 12/01/15 0628  LABPROT 23.6* 23.2* 24.0*  INR 2.12* 2.08* 2.17*    Estimated Creatinine Clearance: 22.5 mL/min (by C-G formula based on Cr of 3.19).   Medications:  Scheduled:  . aspirin EC  81 mg Oral Daily  . cloNIDine  0.3 mg Oral TID  . diclofenac sodium  4 g Topical QID  . diltiazem  300 mg Oral Daily  . donepezil  5 mg Oral QHS  . feeding supplement (NEPRO CARB STEADY)  237 mL Oral TID BM  . glimepiride  2 mg Oral Q breakfast  . hydrALAZINE  10 mg Oral Q8H  . insulin aspart  0-20 Units Subcutaneous TID WC  . insulin aspart  0-5 Units Subcutaneous QHS  . polyethylene glycol  17 g Oral Daily  . senna-docusate  2 tablet Oral BID  . Warfarin - Pharmacist Dosing Inpatient   Does not apply q1800    Assessment: 69yo with AFib on Coumadin.  INR therapeutic at 2.17 today. Coumadin dose was boosted on 5/2.  No bleeding noted except for hematuria without dysuria over last several progress notes. Without gross hematuria noted per Dr.Kirsteins. Noted that meal intake has improved  Goal of Therapy:  INR 2-3 Monitor platelets by anticoagulation protocol: Yes   Plan:  Coumadin 5mg  today Daily INR Watch for s/s of bleeding  Nicole Cella, RPh Clinical Pharmacist Pager: [phone] 12/01/2015 1:48 PM

## 2015-12-01 NOTE — Progress Notes (Signed)
Speech Language Pathology Weekly Progress Patient Details  Name: Patrick [name] MRN: [id number] Date of Birth: 1945/10/10  Beginning of progress report period: November 23, 2015  End of progress report period: Dec 01, 2015     Short Term Goals: Week 1: SLP Short Term Goal 1 (Week 1): Pt will recall basic, daily information with mod assist verbal cues for use of external aids.  SLP Short Term Goal 1 - Progress (Week 1): Met SLP Short Term Goal 2 (Week 1): Pt will attend to basic, familiar tasks for 5-10 minute intervals with mod verbal cues for redirection.  SLP Short Term Goal 2 - Progress (Week 1): Met SLP Short Term Goal 3 (Week 1): Pt will complete basic, familiar tasks with mod assist verbal cues for functional problem solving  SLP Short Term Goal 3 - Progress (Week 1): Met    New Short Term Goals: Week 2: SLP Short Term Goal 1 (Week 2): Pt will recall basic, daily information with min assist verbal cues for use of external aids.  SLP Short Term Goal 2 (Week 2): Pt will attend to basic, familiar tasks for 30-45 minute intervals with supervision verbal cues for redirection.  SLP Short Term Goal 3 (Week 2): Pt will complete basic, familiar tasks with supervision verbal cues for functional problem solving   Weekly Progress Updates:  Pt made small functional gains this reporting period and has met 3 out of 3 short term goals.  Pt is an overall mod assist for basic, familiar tasks due to slowed processing and decreased recall of new information; however, his ability to complete tasks fluctuates greatly depending on his motivation to participate and fatigue and he can range anywhere from supervision to max assist at times.  Pt and family education is ongoing.  Pt would continue to benefit from skilled ST while inpatient in order to maximize functional independence and reduce burden of care prior to discharge.  Continue to anticipate that pt will need 24/7 supervision at discharge in addition to [place]  follow up.     Intensity: Minumum of 1-2 x/day, 30 to 90 minutes Frequency: 3 to 5 out of 7 days Duration/Length of Stay: 17-19  days  Treatment/Interventions: Cognitive remediation/compensation;Cueing hierarchy;Functional tasks;Environmental controls;Internal/external aids;Patient/family education    Dyna Figuereo, Selinda Orion 12/01/2015, 5:03 PM

## 2015-12-01 NOTE — Progress Notes (Signed)
Occupational Therapy Session Note  Patient Details  Name: Patrick [name] MRN: [id number] Date of Birth: 03-Mar-1946  Today's Date: 12/01/2015 OT Individual Time: 1005-1105 OT Individual Time Calculation (min): 60 min    Short Term Goals: Week 2:  OT Short Term Goal 1 (Week 2): Pt will complete UB bathing with supervison sitting unsupported EOM or EOB.   OT Short Term Goal 2 (Week 2): Pt will perform LB bathing sit to stand with mod assist 2 consecutive sessions. OT Short Term Goal 3 (Week 2): Pt will donn pullover shirt with supervison in unsupported sitting. OT Short Term Goal 4 (Week 2): Pt will complete stand pivot transfers with LRAD and mod assist to the 3:1 or shower bench.  OT Short Term Goal 5 (Week 2): Pt will tolerate 30 mins B/D session with no more than 2 rest breaks.   Skilled Therapeutic Interventions/Progress Updates:    Pt transferred from wheelchair to tub bench with use of steady and min assist for sit to stand.  Once on the seat he was able to use the grab bars to scoot over on the seat.  Supervision for UB bathing with mod assist for LB bathing sit to stand.  Max assist for crossing the RLE over the left for bathing and dressing.  He needs mod assist for sit to stand when pulling pants over hips as well.  Better endurance this session but still needed 2-3 rest breaks for 45 mins of selfcare.  Pt transferred to bed at end of session secondary to fatigue.  Bed alarm in place with call button and phone within reach.    Therapy Documentation Precautions:  Precautions Precautions: Fall Precaution Comments: RLE hemiparesis Restrictions Weight Bearing Restrictions: No  Pain: Pain Assessment Pain Assessment: No/denies pain ADL: See Function Navigator for Current Functional Status.   Therapy/Group: Individual Therapy  Amarys Sliwinski OTR/L 12/01/2015, 12:54 PM

## 2015-12-01 NOTE — Progress Notes (Signed)
Speech Language Pathology Daily Session Note  Patient Details  Name: Patrick [name] MRN: [id number] Date of Birth: 1946-06-05  Today's Date: 12/01/2015 SLP Individual Time: [id number] [id number] Individual Time Calculation (min): 58 min  Short Term Goals: Week 1: SLP Short Term Goal 1 (Week 1): Pt will recall basic, daily information with mod assist verbal cues for use of external aids.  SLP Short Term Goal 2 (Week 1): Pt will attend to basic, familiar tasks for 5-10 minute intervals with mod verbal cues for redirection.  SLP Short Term Goal 3 (Week 1): Pt will complete basic, familiar tasks with mod assist verbal cues for functional problem solving   Skilled Therapeutic Interventions: Pt alert and eating breakfast. Reports he slept well. Recall of events from previous day- able to recall motivating details, "My son was here" but unable to recall information re: selfcare, "I don't know if I took a bath." Review of novel card game from previous SLP session. Pt able to recognize game, but unable to recall any rules despite mod cueing. Pt able to complete game with maintenance of 2 directives, but not 3. Min cues to initiate functional tasks such as toothbrushing and face washing. No cues required to finish task to completion- adequate sustained attention for functional tasks.   Function:  Eating Eating   Modified Consistency Diet: No Eating Assist Level: Set up assist for           Cognition Comprehension Comprehension assist level: Understands basic 75 - 89% of the time/ requires cueing 10 - 24% of the time  Expression   Expression assist level: Expresses basic 75 - 89% of the time/requires cueing 10 - 24% of the time. Needs helper to occlude trach/needs to repeat words.  Social Interaction Social Interaction assist level: Interacts appropriately 75 - 89% of the time - Needs redirection for appropriate language or to initiate interaction.  Problem Solving Problem solving assist level: Solves basic  50 - 74% of the time/requires cueing 25 - 49% of the time  Memory Memory assist level: Recognizes or recalls 50 - 74% of the time/requires cueing 25 - 49% of the time    Pain Pain Assessment Pain Assessment: No/denies pain  Therapy/Group: Individual Therapy  [place], CCC-SLP 12/01/2015, 9:40 AM

## 2015-12-01 NOTE — Progress Notes (Signed)
Subjective/Complaints: Appreciate Neuropsych eval No HA  ROS- denies any CP/SOB, N/V/D, +HA  Objective: Vital Signs: Blood pressure 194/95, pulse 64, temperature 97.5 F (36.4 C), temperature source Oral, resp. rate 18, height 5' 9.5" (1.765 m), weight 77.293 kg (170 lb 6.4 oz), SpO2 100 %. No results found. Results for orders placed or performed during the hospital encounter of 11/22/15 (from the past 72 hour(s))  Glucose, capillary     Status: Abnormal   Collection Time: 11/28/15 11:45 AM  Result Value Ref Range   Glucose-Capillary 185 (H) 65 - 99 mg/dL   Comment 1 Notify RN   Glucose, capillary     Status: Abnormal   Collection Time: 11/28/15  4:50 PM  Result Value Ref Range   Glucose-Capillary 142 (H) 65 - 99 mg/dL   Comment 1 Notify RN   Glucose, capillary     Status: Abnormal   Collection Time: 11/28/15  9:08 PM  Result Value Ref Range   Glucose-Capillary 126 (H) 65 - 99 mg/dL  Protime-INR     Status: Abnormal   Collection Time: 11/29/15  4:43 AM  Result Value Ref Range   Prothrombin Time 23.6 (H) 11.6 - 15.2 seconds   INR 2.12 (H) 0.00 - 1.49  Glucose, capillary     Status: None   Collection Time: 11/29/15  6:49 AM  Result Value Ref Range   Glucose-Capillary 90 65 - 99 mg/dL  Glucose, capillary     Status: Abnormal   Collection Time: 11/29/15 11:52 AM  Result Value Ref Range   Glucose-Capillary 190 (H) 65 - 99 mg/dL   Comment 1 Notify RN   Glucose, capillary     Status: Abnormal   Collection Time: 11/29/15  4:55 PM  Result Value Ref Range   Glucose-Capillary 187 (H) 65 - 99 mg/dL   Comment 1 Notify RN   Glucose, capillary     Status: Abnormal   Collection Time: 11/29/15  8:23 PM  Result Value Ref Range   Glucose-Capillary 167 (H) 65 - 99 mg/dL  Protime-INR     Status: Abnormal   Collection Time: 11/30/15  5:30 AM  Result Value Ref Range   Prothrombin Time 23.2 (H) 11.6 - 15.2 seconds   INR 2.08 (H) 0.00 - 1.49  Glucose, capillary     Status: None    Collection Time: 11/30/15  6:40 AM  Result Value Ref Range   Glucose-Capillary 99 65 - 99 mg/dL  Glucose, capillary     Status: Abnormal   Collection Time: 11/30/15 11:26 AM  Result Value Ref Range   Glucose-Capillary 219 (H) 65 - 99 mg/dL  Glucose, capillary     Status: Abnormal   Collection Time: 11/30/15  4:43 PM  Result Value Ref Range   Glucose-Capillary 172 (H) 65 - 99 mg/dL  Glucose, capillary     Status: Abnormal   Collection Time: 11/30/15  8:49 PM  Result Value Ref Range   Glucose-Capillary 164 (H) 65 - 99 mg/dL  Protime-INR     Status: Abnormal   Collection Time: 12/01/15  6:28 AM  Result Value Ref Range   Prothrombin Time 24.0 (H) 11.6 - 15.2 seconds   INR 2.17 (H) 0.00 - 1.49  Glucose, capillary     Status: None   Collection Time: 12/01/15  6:28 AM  Result Value Ref Range   Glucose-Capillary 82 65 - 99 mg/dL     HEENT: normal Cardio: RRR and no murmur Resp: CTA B/L and unlabored GI: BS positive and  NT, ND GU:  sm amt meatal blood mixed with urine Extremity:  Pulses positive and No Edema Skin:   Intact Neuro: Alert/Oriented, Normal Sensory, Abnormal Motor 4/5 in RUE , 2- RLE, 5/5 on L side, Abnormal FMC Ataxic/ dec FMC and Dysarthric Musc/Skel:  Other no pain with UE or LE ROM, no joint swelling Gen NAD   Assessment/Plan: 1. Functional deficits secondary to bilateral posterior frontal and parietal infarct with R LE> UE paresis which require 3+ hours per day of interdisciplinary therapy in a comprehensive inpatient rehab setting. Physiatrist is providing close team supervision and 24 hour management of active medical problems listed below. Physiatrist and rehab team continue to assess barriers to discharge/monitor patient progress toward functional and medical goals. FIM: Function - Bathing Position: Wheelchair/chair at sink Body parts bathed by patient: Right arm, Left arm, Chest, Abdomen, Front perineal area, Right upper leg, Left upper leg, Left lower  leg Body parts bathed by helper: Back, Right lower leg, Buttocks Assist Level:  (min A)  Function- Upper Body Dressing/Undressing What is the patient wearing?: Hospital gown Pull over shirt/dress - Perfomed by patient: Thread/unthread right sleeve, Thread/unthread left sleeve, Put head through opening Pull over shirt/dress - Perfomed by helper: Pull shirt over trunk Assist Level: Set up, Supervision or verbal cues Set up : To obtain clothing/put away Function - Lower Body Dressing/Undressing What is the patient wearing?: Non-skid slipper socks, Pants, Underwear, Socks Position: Wheelchair/chair at sink Underwear - Performed by patient: Thread/unthread left underwear leg Underwear - Performed by helper: Thread/unthread right underwear leg Pants- Performed by patient: Thread/unthread left pants leg Pants- Performed by helper: Pull pants up/down, Thread/unthread left pants leg Non-skid slipper socks- Performed by patient: Don/doff left sock Non-skid slipper socks- Performed by helper: Don/doff right sock TED Hose - Performed by helper: Don/doff right TED hose, Don/doff left TED hose Assist for footwear: Partial/moderate assist Assist for lower body dressing: Touching or steadying assistance (Pt > 75%)  Function - Toileting Toileting steps completed by helper: Adjust clothing prior to toileting, Performs perineal hygiene, Adjust clothing after toileting Assist level: Two helpers (per [employer], NT)  Function - Air cabin crew transfer assistive device: Bedside commode Assist level to bedside commode (at bedside): Maximal assist (Pt 25 - 49%/lift and lower) Assist level from bedside commode (at bedside): Maximal assist (Pt 25 - 49%/lift and lower)  Function - Chair/bed transfer Chair/bed transfer method: Stand pivot Chair/bed transfer assist level: Maximal assist (Pt 25 - 49%/lift and lower) Chair/bed transfer assistive device: Armrests Chair/bed transfer details: Verbal cues  for technique, Verbal cues for precautions/safety, Verbal cues for sequencing, Manual facilitation for placement, Manual facilitation for weight bearing, Manual facilitation for weight shifting, Tactile cues for placement  Function - Locomotion: Wheelchair Will patient use wheelchair at discharge?: Yes Type: Manual Max wheelchair distance: 50 Assist Level: Moderate assistance (Pt 50 - 74%) Wheel 50 feet with 2 turns activity did not occur: Safety/medical concerns (lethargy,fatigue) Assist Level: Moderate assistance (Pt 50 - 74%) Wheel 150 feet activity did not occur: Safety/medical concerns (lethargy, fatigue) Turns around,maneuvers to table,bed, and toilet,negotiates 3% grade,maneuvers on rugs and over doorsills: No Function - Locomotion: Ambulation Ambulation activity did not occur: Safety/medical concerns (unsafe due to knee buckling) Assistive device: Rail in hallway, Orthosis Max distance: 25 Assist level: Maximal assist (Pt 25 - 49%) Assist level: Maximal assist (Pt 25 - 49%)  Function - Comprehension Comprehension: Auditory Comprehension assistive device: Hearing aids Comprehension assist level: Understands basic 90% of the time/cues <  10% of the time  Function - Expression Expression: Verbal Expression assist level: Expresses basic 75 - 89% of the time/requires cueing 10 - 24% of the time. Needs helper to occlude trach/needs to repeat words.  Function - Social Interaction Social Interaction assist level: Interacts appropriately 75 - 89% of the time - Needs redirection for appropriate language or to initiate interaction.  Function - Problem Solving Problem solving assist level: Solves basic 50 - 74% of the time/requires cueing 25 - 49% of the time  Function - Memory Memory assist level: Recognizes or recalls 50 - 74% of the time/requires cueing 25 - 49% of the time Patient normally able to recall (first 3 days only): Current season  Medical Problem List and Plan: 1.   Mobilty,cognitive and functional deficits secondary to bi-cerebral embolic CVA             -   2.  DVT Prophylaxis/Anticoagulation: Pharmaceutical: Coumadin              -pt therapeutic now INR 2.17 without gross  hematuria 3. Pain Management:  T#3 prn for headache, d/c tramadol, if not better as BP normalizes will trial topamax       4. Mood: team to provide ego support 5. Neuropsych: This patient is not capable of making decisions on his own behalf--question of mild cognitive deficits at baseline.   6. Skin/Wound Care: routine pressure relief measures. Add supplements between meals.   7. Fluids/Electrolytes/Nutrition:  Monitor I/O. Meal intake improved, will recheck BMET 8. HTN: permissive hypertension with prn hydralazine 25mg  only for SBP >220 or DBP >120.             -holding  , hctz, lisinopril,   restarted catapres .3mg  TID, increase calanSR to 240 - will start 10mg  hydralazine TID scheduled dose,   Filed Vitals:   11/30/15 2020 12/01/15 0513  BP: 166/78 194/95  Pulse:  64  Temp:  97.5 F (36.4 C)  Resp:  18   9. A Fib: cardizem/warfarin. Mild RVR  HR 64 cont current dose calan- ? Pain related Monitor HR with exertion 10 Hyponatremia: mild. Encourage appropriate nutrition 11. CKD: Cr 3.37 today.cont IVF Follow i's and o's, repeat BMET stable creat sl better   12. H/o gout: allopurinol d/c due to increased creat, no flares 13. Gastric ulcers/GIB:  Protonix, monitor any bleeding while on warfarin             -hgb 13.9 14.  Hematuria without dysuria,  UA neg except hematuria, C and S multispecies 15.  Elevated CBG, check Hgb A1C Improving on  amaryl  2mg  CBG (last 3)   Recent Labs  11/30/15 1643 11/30/15 2049 12/01/15 0628  GLUCAP 172* 164* 82  16.  Vascular dementia-will trial aricept although do not expect marked effect  LOS (Days) 9 A FACE TO FACE EVALUATION WAS PERFORMED  KIRSTEINS,ANDREW E 12/01/2015, 7:24 AM

## 2015-12-02 ENCOUNTER — Inpatient Hospital Stay (HOSPITAL_COMMUNITY): Payer: Medicare Other | Admitting: Speech Pathology

## 2015-12-02 ENCOUNTER — Inpatient Hospital Stay (HOSPITAL_COMMUNITY): Payer: Self-pay | Admitting: Physical Therapy

## 2015-12-02 ENCOUNTER — Inpatient Hospital Stay (HOSPITAL_COMMUNITY): Payer: Medicare Other | Admitting: Occupational Therapy

## 2015-12-02 LAB — PROTIME-INR
INR: 2.35 — AB (ref 0.00–1.49)
PROTHROMBIN TIME: 25.5 s — AB (ref 11.6–15.2)

## 2015-12-02 LAB — GLUCOSE, CAPILLARY
Glucose-Capillary: 97 mg/dL (ref 65–99)
Glucose-Capillary: 139 mg/dL — ABNORMAL HIGH (ref 65–99)
Glucose-Capillary: 176 mg/dL — ABNORMAL HIGH (ref 65–99)
Glucose-Capillary: 146 mg/dL — ABNORMAL HIGH (ref 65–99)

## 2015-12-02 LAB — BASIC METABOLIC PANEL
Anion gap: 10 (ref 5–15)
BUN: 55 mg/dL — ABNORMAL HIGH (ref 6–20)
CALCIUM: 8.7 mg/dL — AB (ref 8.9–10.3)
CO2: 23 mmol/L (ref 22–32)
CREATININE: 2.8 mg/dL — AB (ref 0.61–1.24)
Chloride: 103 mmol/L (ref 101–111)
GFR calc Af Amer: 25 mL/min — ABNORMAL LOW (ref >60)
GFR calc non Af Amer: 22 mL/min — ABNORMAL LOW (ref >60)
GLUCOSE: 159 mg/dL — AB (ref 65–99)
Potassium: 4 mmol/L (ref 3.5–5.1)
Sodium: 136 mmol/L (ref 135–145)

## 2015-12-02 MED ORDER — WARFARIN SODIUM 5 MG PO TABS
2.5000 mg | ORAL_TABLET | ORAL | Status: DC
  Administered 2015-12-05 – 2015-12-08 (×2): 2.5 mg via ORAL
  Filled 2015-12-02 (×3): qty 1

## 2015-12-02 MED ORDER — WARFARIN SODIUM 5 MG PO TABS
2.5000 mg | ORAL_TABLET | Freq: Once | ORAL | Status: AC
  Administered 2015-12-02: 2.5 mg via ORAL
  Filled 2015-12-02: qty 1

## 2015-12-02 MED ORDER — WARFARIN SODIUM 5 MG PO TABS
5.0000 mg | ORAL_TABLET | ORAL | Status: DC
  Administered 2015-12-03 – 2015-12-07 (×4): 5 mg via ORAL
  Filled 2015-12-02 (×4): qty 1

## 2015-12-02 NOTE — Progress Notes (Cosign Needed)
Social Work Patient ID: Patrick [name], male   DOB: 09/04/1945, 70 y.o.   MRN: [id number]   CSW met with pt and one of pt's sons to update them on team conference discussion.  CSW explained that team did not set a d/c date because pt's progress has been they can provide care to pt at his current level of care.  CSW explained to son that CSW can help with transition, should family decide they need for pt to go to SNF.   Family and team plan to see how pt does through the weekend and make a d/c disposition decision next week based on pt's progress.  CSW will continue to follow and assist as needed.

## 2015-12-02 NOTE — Progress Notes (Signed)
Social Work Patient ID: Patrick [name], male   DOB: 11-Sep-1945, 70 y.o.   MRN: [id number]   Lynnda Child, LCSW Social Worker Signed  Patient Care Conference 12/01/2015 10:03 AM    Expand All Collapse All   Inpatient RehabilitationTeam Conference and Plan of Care Update Date: 11/30/2015   Time: 10:30 AM     Patient Name: Patrick [name]       Medical Record Number: [id number]  Date of Birth: Dec 02, 1945 Sex: Male         Room/Bed: 4M06C/4M06C-01 Payor Info: Payor: MEDICARE / Plan: MEDICARE PART A AND B / Product Type: *No Product type* /    Admitting Diagnosis: B CVA   Admit Date/Time:  11/22/2015  4:52 PM Admission Comments: No comment available   Primary Diagnosis:  Stroke due to embolism [facility]) Principal Problem: Stroke due to embolism [facility])    Patient Active Problem List     Diagnosis  Date Noted   .  Stroke due to embolism ([place])  11/22/2015   .  TIA (transient ischemic attack)     .  AKI (acute kidney injury) ([place])     .  Other vascular headache     .  History of CVA with residual deficit     .  Supratherapeutic INR     .  Hypertensive emergency  11/18/2015   .  Diastolic CHF, chronic ([place])  11/18/2015   .  Hypertensive encephalopathy  11/18/2015   .  Hypertensive crisis  11/18/2015   .  Headache  11/15/2015   .  Thigh pain  11/15/2015   .  Hematuria  08/30/2015   .  Chronic anemia  03/31/2014   .  Healthcare maintenance  03/31/2014   .  Permanent atrial fibrillation ([place])  07/17/2013   .  Chronic anticoagulation, on coumadin  07/17/2013   .  Tachy-brady syndrome ([place])  07/17/2013   .  Non compliance w medication regimen  06/25/2012   .  Acute ischemic VBA thalamic stroke ([place])  12/11/2010   .  Chronic kidney disease (CKD), stage III (moderate)  12/21/2008   .  CORONARY ARTERY DISEASE, non obstructive disease in 2005 with cath  07/19/2006   .  Diabetes mellitus with severe nonproliferative retinopathy and macular edema  06/05/2006   .  Hyperlipidemia  06/05/2006   .  Gout   06/05/2006   .  DEPRESSION  06/05/2006   .  Uncontrolled hypertension  06/05/2006   .  Sleep apnea  06/05/2006     Expected Discharge Date: Expected Discharge Date:  (likely SNF)  Team Members Present: Physician leading conference: Dr. Alysia Penna Social Worker Present: Alfonse Alpers, LCSW Nurse Present: Dorien Chihuahua, RN PT Present: Georjean Mode, PT OT Present: Clyda Greener, OT SLP Present: Windell Moulding, SLP PPS Coordinator present : Daiva Nakayama, RN, CRRN        Current Status/Progress  Goal  Weekly Team Focus   Medical     poor endurance , acute on chronic cognitive   improve endurance and mobility  bowels   Bowel/Bladder     Incontinent of bladder. Continent of bowel, LBM 11/29/15 after fleet enema. Currently on daily Miralex  Managed bowel and bladder   Monitor frequency of bowel movement    Swallow/Nutrition/ Hydration               ADL's     supervision for UB selfcare, mod to max assist for LB selfcare, decreased endurance overall, needs increased rest breaks  during session.  Pt uses the RUE at a diminshed level.     min assist to supervision level  selfcare re-training, transfer training, endurance building, balance re-training, neuromuscular re-education   Mobility     max assist transfers and gait x 25'; mod assist w/c x 50'  S transfers, min assist gait x 4 steps; superviison w/c x 150'; gait TBD  neuro re-ed, activity tolerance, balance, initiation    Communication               Safety/Cognition/ Behavioral Observations    mild cognitive deficits during basic tasks, limited by poor endurance which impacts motivation to participate   min assist for basic   attention to tasks, basic problem solving during familiar tasks, recall o fdaily information    Pain     Tylenol #3 q 4hrs for headaches    <3  Monitior for effectivness   Skin     Skin tear to L antecubital, tegarderm applied to area. Fistula to intergluteal fold. MASD   No additional skin breakdown   Encourage pt to turn q 2hrs    Rehab Goals Patient on target to meet rehab goals: Yes Rehab Goals Revised: No goals have been revised yet, but pt is making very slow progress toward his goals. *See Care Plan and progress notes for long and short-term goals.    Barriers to Discharge:  see above     Possible Resolutions to Barriers:   cont rehab     Discharge Planning/Teaching Needs:   Pt's wife with recent stroke and family is piecing together care for her.  They are unsure if they can manage both of them at home, long term.    Sons are present regularly for therapy.  Will attend family education if the plan is for pt to return home.    Team Discussion:    Dr. Letta Pate stated pt's headache is better, but he is still having them on and off.  SLP noted pt's flat affect and poor endurance when he is not feeling well.  Pt has some baseline learning difficulties per wife and this may lead to his inconsistency with progress.  PT said that pt's right leg is not very functional with weightbearing and progress has been slow.  OT notes slight improvement with endurance, but pt has poor trunk strength with fatigue.  Pt's transfers are still max assist.    Revisions to Treatment Plan:    none    Continued Need for Acute Rehabilitation Level of Care: The patient requires daily medical management by a physician with specialized training in physical medicine and rehabilitation for the following conditions: Daily direction of a multidisciplinary physical rehabilitation program to ensure safe treatment while eliciting the highest outcome that is of practical value to the patient.: Yes Daily medical management of patient stability for increased activity during participation in an intensive rehabilitation regime.: Yes Daily analysis of laboratory values and/or radiology reports with any subsequent need for medication adjustment of medical intervention for : Neurological problems;Mood/behavior problems;Diabetes  problems  [name], Silvestre Mesi 12/01/2015, 10:03 AM

## 2015-12-02 NOTE — Progress Notes (Signed)
Speech Language Pathology Daily Session Note  Patient Details  Name: Patrick [name] MRN: [id number] Date of Birth: 02-01-1946  Today's Date: 12/02/2015 SLP Individual Time: [id number] SLP Individual Time Calculation (min): 43 min  Short Term Goals: Week 2: SLP Short Term Goal 1 (Week 2): Pt will recall basic, daily information with min assist verbal cues for use of external aids.  SLP Short Term Goal 2 (Week 2): Pt will attend to basic, familiar tasks for 30-45 minute intervals with supervision verbal cues for redirection.  SLP Short Term Goal 3 (Week 2): Pt will complete basic, familiar tasks with supervision verbal cues for functional problem solving  SLP Short Term Goal 4 (Week 2): Pt will identify at least 2 ways in which his current deficits s/p CVA will impact his functional independence in the home environment with mod assist.    Skilled Therapeutic Interventions: Pt was seen for skilled ST targeting cognitive goals.  During informal conversations with the SLP, pt required mod assist question cues to recall at least 3 events from earlier in the day.   SLP facilitated the session with discussion regarding current physical and cognitive limitations.  Pt required max assist verbal cues to identify 2 impairments s/p CVA as well as their impact on his functional independence during ADLs both in the hospital and in the home environment.  Pt required mod assist instructional cues to reorient to rationale behind quick release belt at the end of today's session as he requested its removal upon therapist's departure.  Pt left in wheelchair with quick release belt donned and call bell within reach.  Continue per current plan of care.    Function:  Eating Eating                 Cognition Comprehension Comprehension assist level: Follows basic conversation/direction with extra time/assistive device  Expression   Expression assist level: Expresses basic 75 - 89% of the time/requires cueing 10 -  24% of the time. Needs helper to occlude trach/needs to repeat words.  Social Interaction Social Interaction assist level: Interacts appropriately 75 - 89% of the time - Needs redirection for appropriate language or to initiate interaction.  Problem Solving Problem solving assist level: Solves basic 50 - 74% of the time/requires cueing 25 - 49% of the time  Memory Memory assist level: Recognizes or recalls 50 - 74% of the time/requires cueing 25 - 49% of the time    Pain Pain Assessment Pain Assessment: No/denies pain  Therapy/Group: Individual Therapy  Hideo Googe, Selinda Orion 12/02/2015, 4:13 PM

## 2015-12-02 NOTE — Progress Notes (Signed)
Occupational Therapy Session Note  Patient Details  Name: Patrick [name] MRN: [id number] Date of Birth: 03-23-1946  Today's Date: 12/02/2015 OT Individual Time: 0903-1003 OT Individual Time Calculation (min): 60 min    Short Term Goals: Week 2:  OT Short Term Goal 1 (Week 2): Pt will complete UB bathing with supervison sitting unsupported EOM or EOB.   OT Short Term Goal 2 (Week 2): Pt will perform LB bathing sit to stand with mod assist 2 consecutive sessions. OT Short Term Goal 3 (Week 2): Pt will donn pullover shirt with supervison in unsupported sitting. OT Short Term Goal 4 (Week 2): Pt will complete stand pivot transfers with LRAD and mod assist to the 3:1 or shower bench.  OT Short Term Goal 5 (Week 2): Pt will tolerate 30 mins B/D session with no more than 2 rest breaks.   Skilled Therapeutic Interventions/Progress Updates:    Pt sat EOB to donn pants, socks, AFO for the right foot and shoes.  No LOB noted with dynamic trunk activity during dressing.  Min assist for crossing the RLE over the left knee to start all clothing.  Min assist for straightening out the left pant leg to pull up.  Min assist for sit to stand and standing for pulling them over hips.  Assistance needed for donning shoe and AFO on the right side and tying.  Mod assist for transfer to the wheelchair with use of the RW.  Pt unable to advance the RLE without max assist.  Took pt to the OT gym for further work on functional mobility and standing as well as neuromuscular re-education for the RUE.  Pt worked in standing on maintaining right knee and hip extension while stepping forward with the LLE.  Pt needing max assist to maintain stability at both the hip and knee as he tends to slowly bend the knee and maintain flexed trunk and hip.  Pt transferred back to wheelchair with mod assist using RW for support but needed max assist to take a few steps without use of an assistive device.  Pt left in wheelchair with family present at  end of session.    Therapy Documentation Precautions:  Precautions Precautions: Fall Precaution Comments: RLE hemiparesis Restrictions Weight Bearing Restrictions: No  Pain: Pain Assessment Pain Assessment: No/denies pain ADL: See Function Navigator for Current Functional Status.   Therapy/Group: Individual Therapy  Tamico Mundo OTR/L 12/02/2015, 12:26 PM

## 2015-12-02 NOTE — Progress Notes (Signed)
Speech Language Pathology Daily Session Note  Patient Details  Name: Patrick [name] MRN: [id number] Date of Birth: 1945-11-09  Today's Date: 12/02/2015 SLP Individual Time: 1300-1330 SLP Individual Time Calculation (min): 30 min  Short Term Goals: Week 2: SLP Short Term Goal 1 (Week 2): Pt will recall basic, daily information with min assist verbal cues for use of external aids.  SLP Short Term Goal 2 (Week 2): Pt will attend to basic, familiar tasks for 30-45 minute intervals with supervision verbal cues for redirection.  SLP Short Term Goal 3 (Week 2): Pt will complete basic, familiar tasks with supervision verbal cues for functional problem solving   Skilled Therapeutic Interventions: Pt with flat affect and low interaction. Family present and they report this is pt's baseline personality. Focus on functional med management activity. Pt required mod A to complete intial trial, but faded to min A for second trial. Pt able to recall minimal details re: his birthday party last night. Able to recall activity from therapy session several days ago with min semantic cueing.    Function:  Eating Eating                 Cognition Comprehension Comprehension assist level: Understands basic 75 - 89% of the time/ requires cueing 10 - 24% of the time  Expression   Expression assist level: Expresses basic 75 - 89% of the time/requires cueing 10 - 24% of the time. Needs helper to occlude trach/needs to repeat words.  Social Interaction Social Interaction assist level: Interacts appropriately 75 - 89% of the time - Needs redirection for appropriate language or to initiate interaction.  Problem Solving Problem solving assist level: Solves basic 50 - 74% of the time/requires cueing 25 - 49% of the time  Memory Memory assist level: Recognizes or recalls 50 - 74% of the time/requires cueing 25 - 49% of the time    Pain Pain Assessment Pain Assessment: No/denies pain  Therapy/Group: Individual  Therapy  [place], CCC-SLP 12/02/2015, 2:14 PM

## 2015-12-02 NOTE — Progress Notes (Signed)
Physical Therapy Session Note  Patient Details  Name: Patrick [name] MRN: [id number] Date of Birth: 11/19/1945  Today's Date: 12/02/2015 PT Individual Time: 1106-1204 PT Individual Time Calculation (min): 58 min   Short Term Goals: Week 2:  PT Short Term Goal 1 (Week 2): pt will move supine> sit iwht min assist PT Short Term Goal 2 (Week 2): pt will transfer to L and R wiht min assist PT Short Term Goal 3 (Week 2): pt will propel w/c x 50' wiht 2 turns wiht min assist PT Short Term Goal 4 (Week 2): pt will perform gait x 25' with 1 helper  Skilled Therapeutic Interventions/Progress Updates:   Pt received in w/c with family present. Pt very fatigued but willing to participate.  Pt vitals assessed at beginnig of session; BP high but within parameters.  Pt transported to gym with total A in w/c to perform simulated truck transfer.  Performed stand pivot w/c <> simulated truck x 2 reps with orthosis on RLE with max A for lifting and controlled stand > sit as well as assistance for advancement, placement and stabilization of RLE during pivot and verbal cues for sequence.  Performed stair negotiation for NMR: see below for details.  After stairs pt reporting HA and fatigue; BP re-assessed with systolic over parameters; RN alerted.  Pt reporting need to use bathroom.   Performed transfer w/c <> toilet stand pivot with max A with UE support on grab bar and therapist on R side assisting with RLE advancement, placement and stabilization in stance.  Pt able to balance intermittently without UE support to doff clothing and partially don pants after hygiene performed by therapist.  Returned to w/c and pt set up with quick release belt in place and all items within reach for lunch.  Therapy Documentation Precautions:  Precautions Precautions: Fall Precaution Comments: RLE hemiparesis Restrictions Weight Bearing Restrictions: No Vital Signs: Therapy Vitals Pulse Rate: 83 BP: (!) 206/109 mmHg Patient  Position (if appropriate): Sitting Oxygen Therapy SpO2: 100 % Pain: Pain Assessment Pain Assessment: No/denies pain Other Treatments: Treatments Neuromuscular Facilitation: Right;Left;Lower Extremity;Forced use;Activity to increase coordination;Activity to increase motor control;Activity to increase timing and sequencing;Activity to increase sustained activation;Activity to increase lateral weight shifting;Activity to increase anterior-posterior weight shifting on stairs with pt negotiating 4 stairs with bilat UE support on rails with therapist on R side providing facilitation of forwards and lateral weight shifting, upright trunk, advancement, placement and stabilization of RLE.  Pt continues to demonstrate good activation in WB of RLE but still limited in ability to advance RLE.   See Function Navigator for Current Functional Status.   Therapy/Group: Individual Therapy  Raylene Everts [facility] 12/02/2015, 12:29 PM

## 2015-12-02 NOTE — Progress Notes (Signed)
Subjective/Complaints: Pt without new issues, appreciate SW note No HA  ROS- denies any CP/SOB, N/V/D, +HA  Objective: Vital Signs: Blood pressure 189/77, pulse 60, temperature 98.5 F (36.9 C), temperature source Oral, resp. rate 16, height 5' 9.5" (1.765 m), weight 76.885 kg (169 lb 8 oz), SpO2 100 %. No results found. Results for orders placed or performed during the hospital encounter of 11/22/15 (from the past 72 hour(s))  Glucose, capillary     Status: Abnormal   Collection Time: 11/29/15 11:52 AM  Result Value Ref Range   Glucose-Capillary 190 (H) 65 - 99 mg/dL   Comment 1 Notify RN   Glucose, capillary     Status: Abnormal   Collection Time: 11/29/15  4:55 PM  Result Value Ref Range   Glucose-Capillary 187 (H) 65 - 99 mg/dL   Comment 1 Notify RN   Glucose, capillary     Status: Abnormal   Collection Time: 11/29/15  8:23 PM  Result Value Ref Range   Glucose-Capillary 167 (H) 65 - 99 mg/dL  Protime-INR     Status: Abnormal   Collection Time: 11/30/15  5:30 AM  Result Value Ref Range   Prothrombin Time 23.2 (H) 11.6 - 15.2 seconds   INR 2.08 (H) 0.00 - 1.49  Glucose, capillary     Status: None   Collection Time: 11/30/15  6:40 AM  Result Value Ref Range   Glucose-Capillary 99 65 - 99 mg/dL  Glucose, capillary     Status: Abnormal   Collection Time: 11/30/15 11:26 AM  Result Value Ref Range   Glucose-Capillary 219 (H) 65 - 99 mg/dL  Glucose, capillary     Status: Abnormal   Collection Time: 11/30/15  4:43 PM  Result Value Ref Range   Glucose-Capillary 172 (H) 65 - 99 mg/dL  Glucose, capillary     Status: Abnormal   Collection Time: 11/30/15  8:49 PM  Result Value Ref Range   Glucose-Capillary 164 (H) 65 - 99 mg/dL  Protime-INR     Status: Abnormal   Collection Time: 12/01/15  6:28 AM  Result Value Ref Range   Prothrombin Time 24.0 (H) 11.6 - 15.2 seconds   INR 2.17 (H) 0.00 - 1.49  Glucose, capillary     Status: None   Collection Time: 12/01/15  6:28 AM   Result Value Ref Range   Glucose-Capillary 82 65 - 99 mg/dL  Glucose, capillary     Status: Abnormal   Collection Time: 12/01/15 11:34 AM  Result Value Ref Range   Glucose-Capillary 193 (H) 65 - 99 mg/dL  Glucose, capillary     Status: Abnormal   Collection Time: 12/01/15  4:31 PM  Result Value Ref Range   Glucose-Capillary 172 (H) 65 - 99 mg/dL  Glucose, capillary     Status: Abnormal   Collection Time: 12/01/15  8:45 PM  Result Value Ref Range   Glucose-Capillary 233 (H) 65 - 99 mg/dL  Protime-INR     Status: Abnormal   Collection Time: 12/02/15  5:10 AM  Result Value Ref Range   Prothrombin Time 25.5 (H) 11.6 - 15.2 seconds   INR 2.35 (H) 0.00 - 1.49  Glucose, capillary     Status: None   Collection Time: 12/02/15  6:50 AM  Result Value Ref Range   Glucose-Capillary 97 65 - 99 mg/dL     HEENT: normal Cardio: RRR and no murmur Resp: CTA B/L and unlabored GI: BS positive and NT, ND GU:  sm amt meatal blood mixed with  urine Extremity:  Pulses positive and No Edema Skin:   Intact Neuro: Alert/Oriented, Normal Sensory, Abnormal Motor 4/5 in RUE , 2- RLE, 5/5 on L side, Abnormal FMC Ataxic/ dec FMC and Dysarthric Musc/Skel:  Other no pain with UE or LE ROM, no joint swelling Gen NAD   Assessment/Plan: 1. Functional deficits secondary to bilateral posterior frontal and parietal infarct with R LE> UE paresis which require 3+ hours per day of interdisciplinary therapy in a comprehensive inpatient rehab setting. Physiatrist is providing close team supervision and 24 hour management of active medical problems listed below. Physiatrist and rehab team continue to assess barriers to discharge/monitor patient progress toward functional and medical goals. FIM: Function - Bathing Position: Shower Body parts bathed by patient: Right arm, Left arm, Chest, Abdomen, Front perineal area, Right upper leg, Left upper leg Body parts bathed by helper: Right lower leg, Left lower leg,  Buttocks, Back Assist Level:  (min A)  Function- Upper Body Dressing/Undressing What is the patient wearing?: Hospital gown Pull over shirt/dress - Perfomed by patient: Thread/unthread right sleeve, Thread/unthread left sleeve, Put head through opening Pull over shirt/dress - Perfomed by helper: Pull shirt over trunk Assist Level: Set up, Supervision or verbal cues Set up : To obtain clothing/put away Function - Lower Body Dressing/Undressing What is the patient wearing?: Non-skid slipper socks, Pants, Underwear, Socks Position: Wheelchair/chair at sink Underwear - Performed by patient: Thread/unthread left underwear leg, Thread/unthread right underwear leg Underwear - Performed by helper: Thread/unthread right underwear leg Pants- Performed by patient: Thread/unthread left pants leg Pants- Performed by helper: Thread/unthread right pants leg, Pull pants up/down Non-skid slipper socks- Performed by patient: Don/doff left sock Non-skid slipper socks- Performed by helper: Don/doff right sock TED Hose - Performed by helper: Don/doff right TED hose, Don/doff left TED hose Assist for footwear: Partial/moderate assist Assist for lower body dressing: Touching or steadying assistance (Pt > 75%)  Function - Toileting Toileting steps completed by helper: Adjust clothing prior to toileting, Performs perineal hygiene, Adjust clothing after toileting Assist level: Two helpers (per [employer], NT)  Function - Air cabin crew transfer assistive device: Bedside commode Assist level to bedside commode (at bedside): Maximal assist (Pt 25 - 49%/lift and lower) Assist level from bedside commode (at bedside): Maximal assist (Pt 25 - 49%/lift and lower)  Function - Chair/bed transfer Chair/bed transfer method: Stand pivot Chair/bed transfer assist level: Maximal assist (Pt 25 - 49%/lift and lower) Chair/bed transfer assistive device: Armrests Chair/bed transfer details: Verbal cues for  technique, Verbal cues for precautions/safety, Verbal cues for sequencing, Manual facilitation for placement, Manual facilitation for weight bearing, Manual facilitation for weight shifting, Tactile cues for placement  Function - Locomotion: Wheelchair Will patient use wheelchair at discharge?: Yes Type: Manual Max wheelchair distance: 50 Assist Level: Touching or steadying assistance (Pt > 75%) Wheel 50 feet with 2 turns activity did not occur: Safety/medical concerns (lethargy,fatigue) Assist Level: Moderate assistance (Pt 50 - 74%) Wheel 150 feet activity did not occur: Safety/medical concerns (lethargy, fatigue) Turns around,maneuvers to table,bed, and toilet,negotiates 3% grade,maneuvers on rugs and over doorsills: No Function - Locomotion: Ambulation Ambulation activity did not occur: Safety/medical concerns (unsafe due to knee buckling) Assistive device: Orthosis, Walker-Eva Max distance: 20 Assist level: Maximal assist (Pt 25 - 49%) Assist level: Maximal assist (Pt 25 - 49%)  Function - Comprehension Comprehension: Auditory Comprehension assistive device: Hearing aids Comprehension assist level: Understands basic 75 - 89% of the time/ requires cueing 10 - 24% of the time  Function - Expression Expression: Verbal Expression assist level: Expresses basic 75 - 89% of the time/requires cueing 10 - 24% of the time. Needs helper to occlude trach/needs to repeat words.  Function - Social Interaction Social Interaction assist level: Interacts appropriately 75 - 89% of the time - Needs redirection for appropriate language or to initiate interaction.  Function - Problem Solving Problem solving assist level: Solves basic 50 - 74% of the time/requires cueing 25 - 49% of the time  Function - Memory Memory assist level: Recognizes or recalls 50 - 74% of the time/requires cueing 25 - 49% of the time Patient normally able to recall (first 3 days only): Current season  Medical Problem  List and Plan: 1.  Mobilty,cognitive and functional deficits secondary to bi-cerebral embolic CVA             -   2.  DVT Prophylaxis/Anticoagulation: Pharmaceutical: Coumadin              -pt therapeutic now INR 2.35 without gross  hematuria 3. Pain Management:  T#3 prn for headache, d/c tramadol, if not better as BP normalizes will trial topamax       4. Mood: team to provide ego support 5. Neuropsych: This patient is not capable of making decisions on his own behalf--question of mild cognitive deficits at baseline.   6. Skin/Wound Care: routine pressure relief measures. Add supplements between meals.   7. Fluids/Electrolytes/Nutrition:  Monitor I/O. Meal intake improved, will recheck BMET today 8. HTN: permissive hypertension with prn hydralazine 25mg  only for SBP >220 or DBP >120.             -holding  , hctz, lisinopril,   restarted catapres .3mg  TID, increase calanSR to 240 - will start 10mg  hydralazine TID scheduled dose,  Diastolic improved, still with systolic elevation Filed Vitals:   12/01/15 2202 12/02/15 0529  BP: 163/63 189/77  Pulse: 60 60  Temp:  98.5 F (36.9 C)  Resp:  16   9. A Fib: cardizem/warfarin. Mild RVR  HR 64 cont current dose calan- ? Pain related Monitor HR with exertion 10 Hyponatremia: mild. Encourage appropriate nutrition 11. CKD: Cr 3.37 today.cont IVF Follow i's and o's, repeat BMET stable creat sl better   12. H/o gout: allopurinol d/c due to increased creat, no flares 13. Gastric ulcers/GIB:  Protonix, monitor any bleeding while on warfarin             -hgb 13.9 14.  Hematuria without dysuria,  UA neg except hematuria, C and S multispecies 15.  Elevated CBG, check Hgb A1C Improving on  amaryl  2mg , elevated yest pm, ? Diet related CBG (last 3)   Recent Labs  12/01/15 1631 12/01/15 2045 12/02/15 0650  GLUCAP 172* 233* 97  16.  Vascular dementia-will trial aricept although do not expect marked effect  LOS (Days) 10 A FACE TO FACE EVALUATION WAS  PERFORMED  Natale Thoma E 12/02/2015, 7:33 AM

## 2015-12-02 NOTE — Progress Notes (Signed)
Pt refuses CPAP again tonight. RT to d/c order due to noncompliance per CPAP protocol.

## 2015-12-02 NOTE — Progress Notes (Signed)
ANTICOAGULATION CONSULT NOTE - Follow Up Consult  Pharmacy Consult for Coumadin Indication: atrial fibrillation  No Known Allergies  Patient Measurements: Height: 5' 9.5" (176.5 cm) Weight: 169 lb 8 oz (76.885 kg) IBW/kg (Calculated) : 71.85 Heparin Dosing Weight:   Vital Signs: Temp: 98.5 F (36.9 C) (05/05 0529) BP: 206/109 mmHg (05/05 1150) Pulse Rate: 83 (05/05 1150)  Labs:  Recent Labs  11/30/15 0530 12/01/15 0628 12/02/15 0510 12/02/15 1016  LABPROT 23.2* 24.0* 25.5*  --   INR 2.08* 2.17* 2.35*  --   CREATININE  --   --   --  2.80*    Estimated Creatinine Clearance: 25.7 mL/min (by C-G formula based on Cr of 2.8).   Medications:  Scheduled:  . aspirin EC  81 mg Oral Daily  . cloNIDine  0.3 mg Oral TID  . diclofenac sodium  4 g Topical QID  . diltiazem  300 mg Oral Daily  . donepezil  5 mg Oral QHS  . feeding supplement (NEPRO CARB STEADY)  237 mL Oral TID BM  . glimepiride  2 mg Oral Q breakfast  . hydrALAZINE  10 mg Oral Q8H  . insulin aspart  0-20 Units Subcutaneous TID WC  . insulin aspart  0-5 Units Subcutaneous QHS  . polyethylene glycol  17 g Oral Daily  . senna-docusate  2 tablet Oral BID  . Warfarin - Pharmacist Dosing Inpatient   Does not apply q1800    Assessment: 70yo male with AFib.  INR 2.35- improving with increased dosages.  No bleeding noted except for hematuria without dysuria over last several progress notes. Without gross hematuria noted per Dr.Kirsteins. Will resume home dose but give 2.5mg  today  Goal of Therapy:  INR 2-3 Monitor platelets by anticoagulation protocol: Yes   Plan:  Coumadin 2.5mg  today Start Coumadin 5mg  daily, except 2.5mg  on Mon/Thurs Continue daily INR Watch for s/s of bleeding  Gracy Bruins, PharmD Clinical Pharmacist [facility]

## 2015-12-02 NOTE — Patient Care Conference (Signed)
Inpatient RehabilitationTeam Conference and Plan of Care Update Date: 11/30/2015   Time: 10:30 AM    Patient Name: Patrick [name]      Medical Record Number: [id number]  Date of Birth: [birth year] Sex: Male         Room/Bed: 4M06C/4M06C-01 Payor Info: Payor: MEDICARE / Plan: MEDICARE PART A AND B / Product Type: *No Product type* /    Admitting Diagnosis: B CVA  Admit Date/Time:  11/22/2015  4:52 PM Admission Comments: No comment available   Primary Diagnosis:  Stroke due to embolism [facility]) Principal Problem: Stroke due to embolism [facility])  Patient Active Problem List   Diagnosis Date Noted  . Stroke due to embolism ([place]) 11/22/2015  . TIA (transient ischemic attack)   . AKI (acute kidney injury) ([place])   . Other vascular headache   . History of CVA with residual deficit   . Supratherapeutic INR   . Hypertensive emergency 11/18/2015  . Diastolic CHF, chronic ([place]) 11/18/2015  . Hypertensive encephalopathy 11/18/2015  . Hypertensive crisis 11/18/2015  . Headache 11/15/2015  . Thigh pain 11/15/2015  . Hematuria 08/30/2015  . Chronic anemia 03/31/2014  . Healthcare maintenance 03/31/2014  . Permanent atrial fibrillation ([place]) 07/17/2013  . Chronic anticoagulation, on coumadin 07/17/2013  . Tachy-brady syndrome ([place]) 07/17/2013  . Non compliance w medication regimen 06/25/2012  . Acute ischemic VBA thalamic stroke ([place]) 12/11/2010  . Chronic kidney disease (CKD), stage III (moderate) 12/21/2008  . CORONARY ARTERY DISEASE, non obstructive disease in 2005 with cath 07/19/2006  . Diabetes mellitus with severe nonproliferative retinopathy and macular edema 06/05/2006  . Hyperlipidemia 06/05/2006  . Gout 06/05/2006  . DEPRESSION 06/05/2006  . Uncontrolled hypertension 06/05/2006  . Sleep apnea 06/05/2006    Expected Discharge Date: Expected Discharge Date:  (likely SNF)  Team Members Present: Physician leading conference: Dr. Alysia Penna Social Worker Present: Alfonse Alpers,  LCSW Nurse Present: Dorien Chihuahua, RN PT Present: Georjean Mode, PT OT Present: Clyda Greener, OT SLP Present: Windell Moulding, SLP PPS Coordinator present : Daiva Nakayama, RN, CRRN     Current Status/Progress Goal Weekly Team Focus  Medical   poor endurance , acute on chronic cognitive   improve endurance and mobility  bowels   Bowel/Bladder   Incontinent of bladder. Continent of bowel, LBM 11/29/15 after fleet enema. Currently on daily Miralex  Managed bowel and bladder   Monitor frequency of bowel movement   Swallow/Nutrition/ Hydration             ADL's   supervision for UB selfcare, mod to max assist for LB selfcare, decreased endurance overall, needs increased rest breaks during session.  Pt uses the RUE at a diminshed level.    min assist to supervision level  selfcare re-training, transfer training, endurance building, balance re-training, neuromuscular re-education   Mobility   max assist transfers and gait x 25'; mod assist w/c x 50'  S transfers, min assist gait x 4 steps; superviison w/c x 150'; gait TBD  neuro re-ed, activity tolerance, balance, initiation   Communication             Safety/Cognition/ Behavioral Observations  mild cognitive deficits during basic tasks, limited by poor endurance which impacts motivation to participate   min assist for basic   attention to tasks, basic problem solving during familiar tasks, recall o fdaily information    Pain   Tylenol  #3 q 4hrs for headaches   <3  Monitior for effectivness   Skin   Skin  tear to L antecubital, tegarderm applied to area. Fistula to intergluteal fold. MASD   No additional skin breakdown  Encourage pt to turn q 2hrs    Rehab Goals Patient on target to meet rehab goals: Yes Rehab Goals Revised: No goals have been revised yet, but pt is making very slow progress toward his goals. *See Care Plan and progress notes for long and short-term goals.  Barriers to Discharge: see above    Possible Resolutions to  Barriers:  cont rehab    Discharge Planning/Teaching Needs:  Pt's wife with recent stroke and family is piecing together care for her.  They are unsure if they can manage both of them at home, long term.    Sons are present regularly for therapy.  Will attend family education if the plan is for pt to return home.   Team Discussion:  Dr. Letta Pate stated pt's headache is better, but he is still having them on and off.  SLP noted pt's flat affect and poor endurance when he is not feeling well.  Pt has some baseline learning difficulties per wife and this may lead to his inconsistency with progress.  PT said that pt's right leg is not very functional with weightbearing and progress has been slow.  OT notes slight improvement with endurance, but pt has poor trunk strength with fatigue.  Pt's transfers are still max assist.   Revisions to Treatment Plan:  none   Continued Need for Acute Rehabilitation Level of Care: The patient requires daily medical management by a physician with specialized training in physical medicine and rehabilitation for the following conditions: Daily direction of a multidisciplinary physical rehabilitation program to ensure safe treatment while eliciting the highest outcome that is of practical value to the patient.: Yes Daily medical management of patient stability for increased activity during participation in an intensive rehabilitation regime.: Yes Daily analysis of laboratory values and/or radiology reports with any subsequent need for medication adjustment of medical intervention for : Neurological problems;Mood/behavior problems;Diabetes problems  Sedona Wenk, Silvestre Mesi 12/01/2015, 10:03 AM

## 2015-12-02 NOTE — Plan of Care (Deleted)
Pt's plan of care adjusted to 15/7 after speaking with care team and discussed with MD as pt currently unable to tolerate current therapy schedule with OT, PT, and SLP.

## 2015-12-03 ENCOUNTER — Inpatient Hospital Stay (HOSPITAL_COMMUNITY): Payer: Medicare Other

## 2015-12-03 DIAGNOSIS — E119 Type 2 diabetes mellitus without complications: Secondary | ICD-10-CM | POA: Insufficient documentation

## 2015-12-03 DIAGNOSIS — I63413 Cerebral infarction due to embolism of bilateral middle cerebral arteries: Secondary | ICD-10-CM | POA: Insufficient documentation

## 2015-12-03 LAB — GLUCOSE, CAPILLARY
GLUCOSE-CAPILLARY: 131 mg/dL — AB (ref 65–99)
GLUCOSE-CAPILLARY: 148 mg/dL — AB (ref 65–99)
GLUCOSE-CAPILLARY: 180 mg/dL — AB (ref 65–99)
Glucose-Capillary: 142 mg/dL — ABNORMAL HIGH (ref 65–99)

## 2015-12-03 LAB — PROTIME-INR
INR: 2.55 — ABNORMAL HIGH (ref 0.00–1.49)
PROTHROMBIN TIME: 27.1 s — AB (ref 11.6–15.2)

## 2015-12-03 MED ORDER — HYDRALAZINE HCL 25 MG PO TABS
25.0000 mg | ORAL_TABLET | Freq: Three times a day (TID) | ORAL | Status: DC
  Administered 2015-12-03 – 2015-12-04 (×3): 25 mg via ORAL
  Filled 2015-12-03 (×3): qty 1

## 2015-12-03 MED ORDER — GLIMEPIRIDE 4 MG PO TABS
4.0000 mg | ORAL_TABLET | Freq: Every day | ORAL | Status: DC
Start: 2015-12-03 — End: 2015-12-05
  Administered 2015-12-03 – 2015-12-04 (×2): 4 mg via ORAL
  Filled 2015-12-03 (×2): qty 1

## 2015-12-03 NOTE — Progress Notes (Signed)
Subjective/Complaints: Patient lying comfortably in bed this morning. He did not have any complaints.  ROS- denies any CP/SOB, N/V/D  Objective: Vital Signs: Blood pressure 194/87, pulse 63, temperature 98 F (36.7 C), temperature source Oral, resp. rate 18, height 5' 9.5" (1.765 m), weight 77.2 kg (170 lb 3.1 oz), SpO2 100 %. No results found. Results for orders placed or performed during the hospital encounter of 11/22/15 (from the past 72 hour(s))  Glucose, capillary     Status: Abnormal   Collection Time: 11/30/15 11:26 AM  Result Value Ref Range   Glucose-Capillary 219 (H) 65 - 99 mg/dL  Glucose, capillary     Status: Abnormal   Collection Time: 11/30/15  4:43 PM  Result Value Ref Range   Glucose-Capillary 172 (H) 65 - 99 mg/dL  Glucose, capillary     Status: Abnormal   Collection Time: 11/30/15  8:49 PM  Result Value Ref Range   Glucose-Capillary 164 (H) 65 - 99 mg/dL  Protime-INR     Status: Abnormal   Collection Time: 12/01/15  6:28 AM  Result Value Ref Range   Prothrombin Time 24.0 (H) 11.6 - 15.2 seconds   INR 2.17 (H) 0.00 - 1.49  Glucose, capillary     Status: None   Collection Time: 12/01/15  6:28 AM  Result Value Ref Range   Glucose-Capillary 82 65 - 99 mg/dL  Glucose, capillary     Status: Abnormal   Collection Time: 12/01/15 11:34 AM  Result Value Ref Range   Glucose-Capillary 193 (H) 65 - 99 mg/dL  Glucose, capillary     Status: Abnormal   Collection Time: 12/01/15  4:31 PM  Result Value Ref Range   Glucose-Capillary 172 (H) 65 - 99 mg/dL  Glucose, capillary     Status: Abnormal   Collection Time: 12/01/15  8:45 PM  Result Value Ref Range   Glucose-Capillary 233 (H) 65 - 99 mg/dL  Protime-INR     Status: Abnormal   Collection Time: 12/02/15  5:10 AM  Result Value Ref Range   Prothrombin Time 25.5 (H) 11.6 - 15.2 seconds   INR 2.35 (H) 0.00 - 1.49  Glucose, capillary     Status: None   Collection Time: 12/02/15  6:50 AM  Result Value Ref Range    Glucose-Capillary 97 65 - 99 mg/dL  Basic metabolic panel     Status: Abnormal   Collection Time: 12/02/15 10:16 AM  Result Value Ref Range   Sodium 136 135 - 145 mmol/L   Potassium 4.0 3.5 - 5.1 mmol/L   Chloride 103 101 - 111 mmol/L   CO2 23 22 - 32 mmol/L   Glucose, Bld 159 (H) 65 - 99 mg/dL   BUN 55 (H) 6 - 20 mg/dL   Creatinine, Ser 2.80 (H) 0.61 - 1.24 mg/dL   Calcium 8.7 (L) 8.9 - 10.3 mg/dL   GFR calc non Af Amer 22 (L) >60 mL/min   GFR calc Af Amer 25 (L) >60 mL/min    Comment: (NOTE) The eGFR has been calculated using the CKD EPI equation. This calculation has not been validated in all clinical situations. eGFR's persistently <60 mL/min signify possible Chronic Kidney Disease.    Anion gap 10 5 - 15  Glucose, capillary     Status: Abnormal   Collection Time: 12/02/15 12:08 PM  Result Value Ref Range   Glucose-Capillary 139 (H) 65 - 99 mg/dL   Comment 1 Notify RN   Glucose, capillary     Status: Abnormal  Collection Time: 12/02/15  5:12 PM  Result Value Ref Range   Glucose-Capillary 176 (H) 65 - 99 mg/dL   Comment 1 Notify RN   Glucose, capillary     Status: Abnormal   Collection Time: 12/02/15  8:50 PM  Result Value Ref Range   Glucose-Capillary 146 (H) 65 - 99 mg/dL  Protime-INR     Status: Abnormal   Collection Time: 12/03/15  3:44 AM  Result Value Ref Range   Prothrombin Time 27.1 (H) 11.6 - 15.2 seconds   INR 2.55 (H) 0.00 - 1.49  Glucose, capillary     Status: Abnormal   Collection Time: 12/03/15  6:44 AM  Result Value Ref Range   Glucose-Capillary 131 (H) 65 - 99 mg/dL     HEENT: Normocephalic, atraumatic. Cardio: RRR and no murmur Resp: CTA B/L and unlabored GI: BS positive and NT, ND Skin:   Intact. Warm and dry Neuro: Alert/Oriented x3  Motor 4+/5 in RUE , 2- RLE 5/5 LUE/LLE Dysarthric Musc/Skel:  No edema. No tenderness. Gen NAD. Vital signs reviewed.   Assessment/Plan: 1. Functional deficits secondary to bilateral posterior frontal and  parietal infarct with R LE> UE paresis which require 3+ hours per day of interdisciplinary therapy in a comprehensive inpatient rehab setting. Physiatrist is providing close team supervision and 24 hour management of active medical problems listed below. Physiatrist and rehab team continue to assess barriers to discharge/monitor patient progress toward functional and medical goals. FIM: Function - Bathing Position: Shower Body parts bathed by patient: Right arm, Left arm, Chest, Abdomen, Front perineal area, Right upper leg, Left upper leg Body parts bathed by helper: Right lower leg, Left lower leg, Buttocks, Back Assist Level:  (min A)  Function- Upper Body Dressing/Undressing What is the patient wearing?: Hospital gown Pull over shirt/dress - Perfomed by patient: Thread/unthread right sleeve, Thread/unthread left sleeve, Put head through opening Pull over shirt/dress - Perfomed by helper: Pull shirt over trunk Assist Level: Set up, Supervision or verbal cues Set up : To obtain clothing/put away Function - Lower Body Dressing/Undressing What is the patient wearing?: Socks, Shoes, AFO, Pants Position: Wheelchair/chair at sink Underwear - Performed by patient: Thread/unthread left underwear leg, Thread/unthread right underwear leg Underwear - Performed by helper: Thread/unthread right underwear leg Pants- Performed by patient: Thread/unthread right pants leg, Thread/unthread left pants leg, Pull pants up/down Pants- Performed by helper: Thread/unthread right pants leg, Pull pants up/down Non-skid slipper socks- Performed by patient: Don/doff right sock, Don/doff left sock Non-skid slipper socks- Performed by helper: Don/doff right sock Socks - Performed by patient: Don/doff right sock, Don/doff left sock Shoes - Performed by patient: Don/doff left shoe, Fasten left Shoes - Performed by helper: Fasten right, Don/doff right shoe AFO - Performed by helper: Don/doff right AFO TED Hose -  Performed by helper: Don/doff right TED hose, Don/doff left TED hose Assist for footwear: Partial/moderate assist Assist for lower body dressing: Touching or steadying assistance (Pt > 75%)  Function - Toileting Toileting activity did not occur: Safety/medical concerns Toileting steps completed by patient: Adjust clothing prior to toileting Toileting steps completed by helper: Performs perineal hygiene, Adjust clothing after toileting Toileting Assistive Devices: Grab bar or rail Assist level: Two helpers (per [employer], NT)  Function - Air cabin crew transfer assistive device: Elevated toilet seat/BSC over toilet Assist level to toilet: 2 helpers Assist level from toilet: Maximal assist (Pt 25 - 49%/lift and lower) Assist level to bedside commode (at bedside): Maximal assist (Pt 25 -  49%/lift and lower) Assist level from bedside commode (at bedside): Maximal assist (Pt 25 - 49%/lift and lower)  Function - Chair/bed transfer Chair/bed transfer method: Stand pivot Chair/bed transfer assist level: Maximal assist (Pt 25 - 49%/lift and lower) Chair/bed transfer assistive device: Armrests, Orthosis Chair/bed transfer details: Verbal cues for technique, Verbal cues for precautions/safety, Verbal cues for sequencing, Manual facilitation for placement, Manual facilitation for weight bearing, Manual facilitation for weight shifting, Tactile cues for placement  Function - Locomotion: Wheelchair Will patient use wheelchair at discharge?: Yes Type: Manual Max wheelchair distance: 50 Assist Level: Touching or steadying assistance (Pt > 75%) Wheel 50 feet with 2 turns activity did not occur: Safety/medical concerns (lethargy,fatigue) Assist Level: Moderate assistance (Pt 50 - 74%) Wheel 150 feet activity did not occur: Safety/medical concerns (lethargy, fatigue) Turns around,maneuvers to table,bed, and toilet,negotiates 3% grade,maneuvers on rugs and over doorsills: No Function -  Locomotion: Ambulation Ambulation activity did not occur: Safety/medical concerns (unsafe due to knee buckling) Assistive device: Orthosis, Walker-Eva Max distance: 20 Assist level: Maximal assist (Pt 25 - 49%) Assist level: Maximal assist (Pt 25 - 49%)  Function - Comprehension Comprehension: Auditory Comprehension assistive device: Hearing aids Comprehension assist level: Follows basic conversation/direction with extra time/assistive device  Function - Expression Expression: Verbal Expression assist level: Expresses basic 75 - 89% of the time/requires cueing 10 - 24% of the time. Needs helper to occlude trach/needs to repeat words.  Function - Social Interaction Social Interaction assist level: Interacts appropriately 75 - 89% of the time - Needs redirection for appropriate language or to initiate interaction.  Function - Problem Solving Problem solving assist level: Solves basic 50 - 74% of the time/requires cueing 25 - 49% of the time  Function - Memory Memory assist level: Recognizes or recalls 50 - 74% of the time/requires cueing 25 - 49% of the time Patient normally able to recall (first 3 days only): Current season  Medical Problem List and Plan: 1.  Mobilty,cognitive and functional deficits secondary to bi-cerebral embolic CVA   - continue CIR 2.  DVT Prophylaxis/Anticoagulation: Pharmaceutical: Coumadin              -pt therapeutic now INR 2.55 3. Pain Management:  T#3 prn for headache, d/ced tramadol, if not better as BP normalizes will trial topamax       4. Mood: team to provide ego support 5. Neuropsych: This patient is not capable of making decisions on his own behalf--question of mild cognitive deficits at baseline.   6. Skin/Wound Care: routine pressure relief measures. Add supplements between meals.   7. Fluids/Electrolytes/Nutrition:  Monitor I/O. Meal intake improved, repeat BMP improved 8. HTN:  -holding  , hctz, lisinopril,    restarted catapres .70m  TID  Increased Cardizem to 300   Hydralazine TID scheduled dose,  increased to 25 mg on 5/6  Filed Vitals:   12/02/15 2103 12/03/15 0539  BP: 187/90 194/87  Pulse: 47 63  Temp:  98 F (36.7 C)  Resp:  18   9. A Fib: cardizem/warfarin. Continue Cardizem 10 Hyponatremia: mild. Encourage appropriate nutrition 11. CKD: Follow i's and o's, repeat BMET stable creat sl better    12. H/o gout: allopurinol d/c due to increased creat, no flares 13. Gastric ulcers/GIB:  Protonix, monitor any bleeding while on warfarin 14.  Hematuria without dysuria,  UA neg except hematuria, C and S multispecies 15.  Elevated CBG, Hgb A1C 8.5 on 4/30. Improved on  amaryl  225m will increase to 4  mg on 5/6 CBG (last 3)   Recent Labs  12/02/15 1712 12/02/15 2050 12/03/15 0644  GLUCAP 176* 146* 131*  16.  Vascular dementia-will trial aricept although do not expect marked effect  LOS (Days) 11 A FACE TO FACE EVALUATION WAS PERFORMED  Henning Ehle Lorie Phenix 12/03/2015, 7:52 AM

## 2015-12-03 NOTE — Progress Notes (Signed)
ANTICOAGULATION CONSULT NOTE - Follow Up Consult  Pharmacy Consult for Coumadin Indication: atrial fibrillation  No Known Allergies  Patient Measurements: Height: 5' 9.5" (176.5 cm) Weight: 170 lb 3.1 oz (77.2 kg) IBW/kg (Calculated) : 71.85 Heparin Dosing Weight:   Vital Signs: Temp: 98 F (36.7 C) (05/06 0539) Temp Source: Oral (05/06 0539) BP: 214/109 mmHg (05/06 0900) Pulse Rate: 82 (05/06 0900)  Labs:  Recent Labs  12/01/15 0628 12/02/15 0510 12/02/15 1016 12/03/15 0344  LABPROT 24.0* 25.5*  --  27.1*  INR 2.17* 2.35*  --  2.55*  CREATININE  --   --  2.80*  --     Estimated Creatinine Clearance: 25.7 mL/min (by C-G formula based on Cr of 2.8).   Medications:  Scheduled:  . aspirin EC  81 mg Oral Daily  . cloNIDine  0.3 mg Oral TID  . diclofenac sodium  4 g Topical QID  . diltiazem  300 mg Oral Daily  . donepezil  5 mg Oral QHS  . feeding supplement (NEPRO CARB STEADY)  237 mL Oral TID BM  . glimepiride  4 mg Oral Q breakfast  . hydrALAZINE  25 mg Oral Q8H  . insulin aspart  0-20 Units Subcutaneous TID WC  . insulin aspart  0-5 Units Subcutaneous QHS  . polyethylene glycol  17 g Oral Daily  . senna-docusate  2 tablet Oral BID  . [START ON 12/05/2015] warfarin  2.5 mg Oral Once per day on Mon Thu  . warfarin  5 mg Oral Once per day on Sun Tue Wed Fri Sat  . Warfarin - Pharmacist Dosing Inpatient   Does not apply q1800    Assessment: 70yo male with AFib.  INR therapeutic- improving with increased dosages.  No bleeding noted except for hematuria without dysuria over last several progress notes. Without gross hematuria noted.  Goal of Therapy:  INR 2-3 Monitor platelets by anticoagulation protocol: Yes   Plan:  Resume home dose: Coumadin 5mg  daily, except 2.5mg  on Mon/Thurs Continue daily INR Watch for s/s of bleeding  Joya San, PharmD Clinical Pharmacy Resident Pager # [phone] 12/03/2015 10:36 AM

## 2015-12-03 NOTE — Plan of Care (Signed)
Problem: RH PAIN MANAGEMENT Goal: RH STG PAIN MANAGED AT OR BELOW PT'S PAIN GOAL Pain level 3/10 with min assist at discharge  Outcome: Progressing No c/o pain

## 2015-12-03 NOTE — Progress Notes (Addendum)
Physical Therapy Note  Patient Details  Name: Patrick [name] MRN: [id number] Date of Birth: July 29, 1946 Today's Date: 12/03/2015  [id number], 30 min individual tx No pain reported.  Cues for upright posture, scooting forward in recliner.  RAFO and bil shoes donned by PT.    neuromuscular re-education via forced use, manual cues, VCs for: W/c propulsion x 60' room toward gym using bil UEs. LLE with min assist for steering, use of LLE; gait with EVA walker x 36' with assistance to advance RLE, control knee and extend trunk.  Pt demonstrated slight R hip flexion to advance, and slight R knee extension when stepping with LLE.   Pt left resting in w/c family present and all needs within reach. Wife stated she would let Nsg know if she left room so that quick release belt could be donned.   Elpidia Karn 12/03/2015, 4:38 PM

## 2015-12-04 LAB — GLUCOSE, CAPILLARY
GLUCOSE-CAPILLARY: 140 mg/dL — AB (ref 65–99)
GLUCOSE-CAPILLARY: 90 mg/dL (ref 65–99)
Glucose-Capillary: 89 mg/dL (ref 65–99)
Glucose-Capillary: 137 mg/dL — ABNORMAL HIGH (ref 65–99)

## 2015-12-04 LAB — PROTIME-INR
INR: 2.14 — AB (ref 0.00–1.49)
Prothrombin Time: 23.7 seconds — ABNORMAL HIGH (ref 11.6–15.2)

## 2015-12-04 MED ORDER — HYDRALAZINE HCL 50 MG PO TABS
50.0000 mg | ORAL_TABLET | Freq: Three times a day (TID) | ORAL | Status: DC
  Administered 2015-12-04 – 2015-12-09 (×16): 50 mg via ORAL
  Filled 2015-12-04 (×16): qty 1

## 2015-12-04 NOTE — Progress Notes (Signed)
Subjective/Complaints: Patient resting comfortably in bed this morning. He slept well overnight and does not have any complaints at present.  ROS- denies any CP/SOB, N/V/D  Objective: Vital Signs: Blood pressure 212/104, pulse 75, temperature 98.2 F (36.8 C), temperature source Oral, resp. rate 18, height 5' 9.5" (1.765 m), weight 76.386 kg (168 lb 6.4 oz), SpO2 100 %. No results found. Results for orders placed or performed during the hospital encounter of 11/22/15 (from the past 72 hour(s))  Glucose, capillary     Status: Abnormal   Collection Time: 12/01/15 11:34 AM  Result Value Ref Range   Glucose-Capillary 193 (H) 65 - 99 mg/dL  Glucose, capillary     Status: Abnormal   Collection Time: 12/01/15  4:31 PM  Result Value Ref Range   Glucose-Capillary 172 (H) 65 - 99 mg/dL  Glucose, capillary     Status: Abnormal   Collection Time: 12/01/15  8:45 PM  Result Value Ref Range   Glucose-Capillary 233 (H) 65 - 99 mg/dL  Protime-INR     Status: Abnormal   Collection Time: 12/02/15  5:10 AM  Result Value Ref Range   Prothrombin Time 25.5 (H) 11.6 - 15.2 seconds   INR 2.35 (H) 0.00 - 1.49  Glucose, capillary     Status: None   Collection Time: 12/02/15  6:50 AM  Result Value Ref Range   Glucose-Capillary 97 65 - 99 mg/dL  Basic metabolic panel     Status: Abnormal   Collection Time: 12/02/15 10:16 AM  Result Value Ref Range   Sodium 136 135 - 145 mmol/L   Potassium 4.0 3.5 - 5.1 mmol/L   Chloride 103 101 - 111 mmol/L   CO2 23 22 - 32 mmol/L   Glucose, Bld 159 (H) 65 - 99 mg/dL   BUN 55 (H) 6 - 20 mg/dL   Creatinine, Ser 2.80 (H) 0.61 - 1.24 mg/dL   Calcium 8.7 (L) 8.9 - 10.3 mg/dL   GFR calc non Af Amer 22 (L) >60 mL/min   GFR calc Af Amer 25 (L) >60 mL/min    Comment: (NOTE) The eGFR has been calculated using the CKD EPI equation. This calculation has not been validated in all clinical situations. eGFR's persistently <60 mL/min signify possible Chronic Kidney Disease.     Anion gap 10 5 - 15  Glucose, capillary     Status: Abnormal   Collection Time: 12/02/15 12:08 PM  Result Value Ref Range   Glucose-Capillary 139 (H) 65 - 99 mg/dL   Comment 1 Notify RN   Glucose, capillary     Status: Abnormal   Collection Time: 12/02/15  5:12 PM  Result Value Ref Range   Glucose-Capillary 176 (H) 65 - 99 mg/dL   Comment 1 Notify RN   Glucose, capillary     Status: Abnormal   Collection Time: 12/02/15  8:50 PM  Result Value Ref Range   Glucose-Capillary 146 (H) 65 - 99 mg/dL  Protime-INR     Status: Abnormal   Collection Time: 12/03/15  3:44 AM  Result Value Ref Range   Prothrombin Time 27.1 (H) 11.6 - 15.2 seconds   INR 2.55 (H) 0.00 - 1.49  Glucose, capillary     Status: Abnormal   Collection Time: 12/03/15  6:44 AM  Result Value Ref Range   Glucose-Capillary 131 (H) 65 - 99 mg/dL  Glucose, capillary     Status: Abnormal   Collection Time: 12/03/15 12:00 PM  Result Value Ref Range   Glucose-Capillary 148 (  H) 65 - 99 mg/dL  Glucose, capillary     Status: Abnormal   Collection Time: 12/03/15  4:59 PM  Result Value Ref Range   Glucose-Capillary 142 (H) 65 - 99 mg/dL  Glucose, capillary     Status: Abnormal   Collection Time: 12/03/15  9:03 PM  Result Value Ref Range   Glucose-Capillary 180 (H) 65 - 99 mg/dL  Protime-INR     Status: Abnormal   Collection Time: 12/04/15  4:22 AM  Result Value Ref Range   Prothrombin Time 23.7 (H) 11.6 - 15.2 seconds   INR 2.14 (H) 0.00 - 1.49  Glucose, capillary     Status: None   Collection Time: 12/04/15  6:53 AM  Result Value Ref Range   Glucose-Capillary 89 65 - 99 mg/dL     HEENT: Normocephalic, atraumatic. Cardio: RRR and no murmur Resp: CTA B/L and unlabored GI: BS positive and NT, ND Skin:   Intact. Warm and dry Neuro: Alert/Oriented x3  Motor 4+/5 in RUE 2/5 RLE 5/5 LUE/LLE Dysarthric Musc/Skel:  No edema. No tenderness. Gen NAD. Vital signs reviewed.   Assessment/Plan: 1. Functional deficits  secondary to bilateral posterior frontal and parietal infarct with R LE> UE paresis which require 3+ hours per day of interdisciplinary therapy in a comprehensive inpatient rehab setting. Physiatrist is providing close team supervision and 24 hour management of active medical problems listed below. Physiatrist and rehab team continue to assess barriers to discharge/monitor patient progress toward functional and medical goals. FIM: Function - Bathing Position: Shower Body parts bathed by patient: Right arm, Left arm, Chest, Abdomen, Front perineal area, Right upper leg, Left upper leg Body parts bathed by helper: Right lower leg, Left lower leg, Buttocks, Back Assist Level:  (min A)  Function- Upper Body Dressing/Undressing What is the patient wearing?: Hospital gown Pull over shirt/dress - Perfomed by patient: Thread/unthread right sleeve, Thread/unthread left sleeve, Put head through opening Pull over shirt/dress - Perfomed by helper: Pull shirt over trunk Assist Level: Set up, Supervision or verbal cues Set up : To obtain clothing/put away Function - Lower Body Dressing/Undressing What is the patient wearing?: Socks, Shoes, AFO, Pants Position: Wheelchair/chair at sink Underwear - Performed by patient: Thread/unthread left underwear leg, Thread/unthread right underwear leg Underwear - Performed by helper: Thread/unthread right underwear leg Pants- Performed by patient: Thread/unthread right pants leg, Thread/unthread left pants leg, Pull pants up/down Pants- Performed by helper: Thread/unthread right pants leg, Pull pants up/down Non-skid slipper socks- Performed by patient: Don/doff right sock, Don/doff left sock Non-skid slipper socks- Performed by helper: Don/doff right sock Socks - Performed by patient: Don/doff right sock, Don/doff left sock Shoes - Performed by patient: Don/doff left shoe, Fasten left Shoes - Performed by helper: Fasten right, Don/doff right shoe AFO - Performed  by helper: Don/doff right AFO TED Hose - Performed by helper: Don/doff right TED hose, Don/doff left TED hose Assist for footwear: Partial/moderate assist Assist for lower body dressing: Touching or steadying assistance (Pt > 75%)  Function - Toileting Toileting activity did not occur: Safety/medical concerns Toileting steps completed by patient: Adjust clothing prior to toileting Toileting steps completed by helper: Performs perineal hygiene, Adjust clothing after toileting Toileting Assistive Devices: Grab bar or rail Assist level: Touching or steadying assistance (Pt.75%)  Function - Air cabin crew transfer assistive device: Elevated toilet seat/BSC over toilet Assist level to toilet: 2 helpers Assist level from toilet: Maximal assist (Pt 25 - 49%/lift and lower) Assist level to bedside commode (at  bedside): Maximal assist (Pt 25 - 49%/lift and lower) Assist level from bedside commode (at bedside): Maximal assist (Pt 25 - 49%/lift and lower)  Function - Chair/bed transfer Chair/bed transfer method: Stand pivot Chair/bed transfer assist level: Maximal assist (Pt 25 - 49%/lift and lower) Chair/bed transfer assistive device: Armrests, Orthosis Chair/bed transfer details: Verbal cues for technique, Verbal cues for precautions/safety, Verbal cues for sequencing, Manual facilitation for placement, Manual facilitation for weight bearing, Manual facilitation for weight shifting, Tactile cues for placement  Function - Locomotion: Wheelchair Will patient use wheelchair at discharge?: Yes Type: Manual Max wheelchair distance: 60 Assist Level: Touching or steadying assistance (Pt > 75%) Wheel 50 feet with 2 turns activity did not occur: Safety/medical concerns (lethargy,fatigue) Assist Level: Touching or steadying assistance (Pt > 75%) Wheel 150 feet activity did not occur: Safety/medical concerns (lethargy, fatigue) Turns around,maneuvers to table,bed, and toilet,negotiates 3%  grade,maneuvers on rugs and over doorsills: No Function - Locomotion: Ambulation Ambulation activity did not occur: Safety/medical concerns (unsafe due to knee buckling) Assistive device: Orthosis, Walker-Eva Max distance: 26 Assist level: Maximal assist (Pt 25 - 49%) Assist level: Maximal assist (Pt 25 - 49%)  Function - Comprehension Comprehension: Auditory Comprehension assistive device: Hearing aids Comprehension assist level: Follows basic conversation/direction with extra time/assistive device  Function - Expression Expression: Verbal Expression assist level: Expresses basic 75 - 89% of the time/requires cueing 10 - 24% of the time. Needs helper to occlude trach/needs to repeat words.  Function - Social Interaction Social Interaction assist level: Interacts appropriately 75 - 89% of the time - Needs redirection for appropriate language or to initiate interaction.  Function - Problem Solving Problem solving assist level: Solves basic 50 - 74% of the time/requires cueing 25 - 49% of the time  Function - Memory Memory assist level: Recognizes or recalls 50 - 74% of the time/requires cueing 25 - 49% of the time Patient normally able to recall (first 3 days only): Current season  Medical Problem List and Plan: 1.  Mobilty,cognitive and functional deficits secondary to bi-cerebral embolic CVA   - continue CIR 2.  DVT Prophylaxis/Anticoagulation: Pharmaceutical: Coumadin              -pt therapeutic now INR 2.14 3. Pain Management:  T#3 prn for headache, d/ced tramadol, if not better as BP normalizes will trial topamax       4. Mood: team to provide ego support 5. Neuropsych: This patient is not capable of making decisions on his own behalf--question of mild cognitive deficits at baseline.   6. Skin/Wound Care: routine pressure relief measures. Add supplements between meals.   7. Fluids/Electrolytes/Nutrition:  Monitor I/O. Meal intake improved, repeat BMP improved 8.  HTN:  -holding  , hctz, lisinopril,    restarted catapres .61m TID  Increased Cardizem to 300   Hydralazine TID scheduled dose,  increased to 25 mg on 5/6, increased to 50 mg on 5/7  Filed Vitals:   12/03/15 2014 12/04/15 0537  BP: 177/62 212/104  Pulse: 67 75  Temp:  98.2 F (36.8 C)  Resp:  18   9. A Fib: cardizem/warfarin. Continue Cardizem 10 Hyponatremia: mild. Encourage appropriate nutrition 11. CKD: Follow i's and o's, repeat BMET stable creat sl better    12. H/o gout: allopurinol d/c due to increased creat, no flares 13. Gastric ulcers/GIB:  Protonix, monitor any bleeding while on warfarin 14.  Hematuria without dysuria,  UA neg except hematuria, C and S multispecies 15.  Elevated CBG, Hgb A1C  8.5 on 4/30. Improved on  amaryl  41m, increased to 4 mg on 5/6, improving CBG (last 3)   Recent Labs  12/03/15 1659 12/03/15 2103 12/04/15 0653  GLUCAP 142* 180* 89  16.  Vascular dementia-will trial aricept although do not expect marked effect  LOS (Days) 12 A FACE TO FACE EVALUATION WAS PERFORMED  Ankit ALorie Phenix5/01/2016, 7:10 AM

## 2015-12-04 NOTE — Progress Notes (Signed)
ANTICOAGULATION CONSULT NOTE - Follow Up Consult  Pharmacy Consult for Coumadin Indication: atrial fibrillation  No Known Allergies  Patient Measurements: Height: 5' 9.5" (176.5 cm) Weight: 168 lb 6.4 oz (76.386 kg) IBW/kg (Calculated) : 71.85   Vital Signs: Temp: 98.2 F (36.8 C) (05/07 0537) Temp Source: Oral (05/07 0537) BP: 212/104 mmHg (05/07 0537) Pulse Rate: 75 (05/07 0537)  Labs:  Recent Labs  12/02/15 0510 12/02/15 1016 12/03/15 0344 12/04/15 0422  LABPROT 25.5*  --  27.1* 23.7*  INR 2.35*  --  2.55* 2.14*  CREATININE  --  2.80*  --   --     Estimated Creatinine Clearance: 25.7 mL/min (by C-G formula based on Cr of 2.8).   Medications:  Scheduled:  . aspirin EC  81 mg Oral Daily  . cloNIDine  0.3 mg Oral TID  . diclofenac sodium  4 g Topical QID  . diltiazem  300 mg Oral Daily  . donepezil  5 mg Oral QHS  . feeding supplement (NEPRO CARB STEADY)  237 mL Oral TID BM  . glimepiride  4 mg Oral Q breakfast  . hydrALAZINE  50 mg Oral Q8H  . insulin aspart  0-20 Units Subcutaneous TID WC  . insulin aspart  0-5 Units Subcutaneous QHS  . polyethylene glycol  17 g Oral Daily  . senna-docusate  2 tablet Oral BID  . [START ON 12/05/2015] warfarin  2.5 mg Oral Once per day on Mon Thu  . warfarin  5 mg Oral Once per day on Sun Tue Wed Fri Sat  . Warfarin - Pharmacist Dosing Inpatient   Does not apply q1800    Assessment: 70yo male with AFib.  INR therapeutic- improving with increased dosages.  No bleeding noted except for hematuria without dysuria over last several progress notes.   Goal of Therapy:  INR 2-3 Monitor platelets by anticoagulation protocol: Yes   Plan:  Resume home dose: Coumadin 5mg  daily, except 2.5mg  on Mon/Thurs Continue daily INR Watch for s/s of bleeding  Joya San, PharmD Clinical Pharmacy Resident Pager # [phone] 12/04/2015 10:56 AM

## 2015-12-05 ENCOUNTER — Inpatient Hospital Stay (HOSPITAL_COMMUNITY): Payer: Medicare Other | Admitting: Occupational Therapy

## 2015-12-05 ENCOUNTER — Inpatient Hospital Stay (HOSPITAL_COMMUNITY): Payer: Self-pay

## 2015-12-05 ENCOUNTER — Inpatient Hospital Stay (HOSPITAL_COMMUNITY): Payer: Medicare Other | Admitting: Speech Pathology

## 2015-12-05 LAB — GLUCOSE, CAPILLARY
GLUCOSE-CAPILLARY: 100 mg/dL — AB (ref 65–99)
GLUCOSE-CAPILLARY: 262 mg/dL — AB (ref 65–99)
GLUCOSE-CAPILLARY: 76 mg/dL (ref 65–99)
Glucose-Capillary: 61 mg/dL — ABNORMAL LOW (ref 65–99)
Glucose-Capillary: 48 mg/dL — ABNORMAL LOW (ref 65–99)
Glucose-Capillary: 191 mg/dL — ABNORMAL HIGH (ref 65–99)

## 2015-12-05 LAB — PROTIME-INR
INR: 2.03 — ABNORMAL HIGH (ref 0.00–1.49)
Prothrombin Time: 22.9 seconds — ABNORMAL HIGH (ref 11.6–15.2)

## 2015-12-05 MED ORDER — MIRTAZAPINE 7.5 MG PO TABS
7.5000 mg | ORAL_TABLET | Freq: Every day | ORAL | Status: DC
  Administered 2015-12-05 – 2015-12-08 (×4): 7.5 mg via ORAL
  Filled 2015-12-05 (×4): qty 1

## 2015-12-05 MED ORDER — GLIMEPIRIDE 2 MG PO TABS
2.0000 mg | ORAL_TABLET | Freq: Every day | ORAL | Status: DC
  Administered 2015-12-05 – 2015-12-09 (×5): 2 mg via ORAL
  Filled 2015-12-05 (×5): qty 1

## 2015-12-05 NOTE — Progress Notes (Signed)
Physical Therapy Weekly Progress Note  Patient Details  Name: Patrick [name] MRN: [id number] Date of Birth: 1946/02/22  Beginning of progress report period: 11/29/15 End of progress report period: 12/05/15  Today's Date: 12/05/2015 PT Individual Time: 5462-7035 PT Individual Time Calculation (min): 60 min   Patient has met 1 of 4 short term goals.  He partly met 2 STGs.  Patient continues to demonstrate the following deficits: slow processing, poor initiation, RLE sensory deficits, limited motor return RLE, balance, activity tolerance and therefore will continue to benefit from skilled PT intervention to enhance overall performance with activity tolerance, balance, postural control, ability to compensate for deficits, functional use of  right upper extremity and right lower extremity and awareness.  Patient progressing toward long term goals..  Continue plan of care.; POC may need to be changed depending upon family's ability to provide 24/7 assistance.  PT Short Term Goals Week 2:  PT Short Term Goal 1 (Week 2): pt will move supine> sit iwht min assist PT Short Term Goal 1 - Progress (Week 2): Not met PT Short Term Goal 2 (Week 2): pt will transfer to L and R wiht min assist PT Short Term Goal 2 - Progress (Week 2): Partly met PT Short Term Goal 3 (Week 2): pt will propel w/c x 50' wiht 2 turns wiht min assist PT Short Term Goal 3 - Progress (Week 2): Partly met PT Short Term Goal 4 (Week 2): pt will perform gait x 25' with 1 helper PT Short Term Goal 4 - Progress (Week 2): Met  Skilled Therapeutic Interventions/Progress Updates:   Pt c/o L lateral hip pain, unrated.  Meds given at start of tx.  neuromuscular re-education via forced use, demo, manual cues for w/c propulsion x 50' without turns using bil UEs and LLE.  Pt has problems coordinating use of LLE to steer.  Standing with bil UE support x 1 minute, wearing R Reaction AFO, wt shifting laterally focusing on R knee control.  Up/down (3)  3" high steps with assistance to advance RLE and control knee.  Gait with EVA x 20' with mod/max assist for R knee control.  Pt advanced RLE 50% of step lengths, 50% of steps. Pt c/o fatigue, and rested 3 minutes. Pt performed reciprocal scooting in sitting with assistance to promote pelvic dissociation.  Pt has some R hip protraction activity.  Squat pivot transfer to R with min guard assist; to L with mod assist. Pt has difficulty pivoting bil feet. Sit>< stand x 3 from raised mat focusing on forward wt shift.  Pt returned to room, transferred to recliner, and all needs left within reach; quick release belt donned.    Therapy Documentation Precautions:  Precautions Precautions: Fall Precaution Comments: RLE hemiparesis Restrictions Weight Bearing Restrictions: No   Vital Signs: Therapy Vitals Pulse Rate: 67 BP: (!) 155/85 mmHg Patient Position (if appropriate): Sitting Pain: Pain Assessment Pain Assessment: No/denies pain Pain Score: Asleep            See Function Navigator for Current Functional Status.  Therapy/Group: Individual Therapy  Grayland Daisey 12/05/2015, 12:20 PM

## 2015-12-05 NOTE — Significant Event (Signed)
Hypoglycemic Event  CBG: 61  Treatment: 15 GM carbohydrate snack  Symptoms: None  Follow-up CBG: Time:0701 CBG Result:100  Possible Reasons for Event: Inadequate meal intake  Comments/MD notified:treated per protocol.    Patrick [name] A

## 2015-12-05 NOTE — Progress Notes (Addendum)
Subjective/Complaints: Slept poorly denies any bowel or bladder issues overnite no aches or pains  ROS- denies any CP/SOB, N/V/D  Objective: Vital Signs: Blood pressure 188/96, pulse 80, temperature 98.1 F (36.7 C), temperature source Oral, resp. rate 17, height 5' 9.5" (1.765 m), weight 77.111 kg (170 lb), SpO2 100 %. No results found. Results for orders placed or performed during the hospital encounter of 11/22/15 (from the past 72 hour(s))  Basic metabolic panel     Status: Abnormal   Collection Time: 12/02/15 10:16 AM  Result Value Ref Range   Sodium 136 135 - 145 mmol/L   Potassium 4.0 3.5 - 5.1 mmol/L   Chloride 103 101 - 111 mmol/L   CO2 23 22 - 32 mmol/L   Glucose, Bld 159 (H) 65 - 99 mg/dL   BUN 55 (H) 6 - 20 mg/dL   Creatinine, Ser 2.80 (H) 0.61 - 1.24 mg/dL   Calcium 8.7 (L) 8.9 - 10.3 mg/dL   GFR calc non Af Amer 22 (L) >60 mL/min   GFR calc Af Amer 25 (L) >60 mL/min    Comment: (NOTE) The eGFR has been calculated using the CKD EPI equation. This calculation has not been validated in all clinical situations. eGFR's persistently <60 mL/min signify possible Chronic Kidney Disease.    Anion gap 10 5 - 15  Glucose, capillary     Status: Abnormal   Collection Time: 12/02/15 12:08 PM  Result Value Ref Range   Glucose-Capillary 139 (H) 65 - 99 mg/dL   Comment 1 Notify RN   Glucose, capillary     Status: Abnormal   Collection Time: 12/02/15  5:12 PM  Result Value Ref Range   Glucose-Capillary 176 (H) 65 - 99 mg/dL   Comment 1 Notify RN   Glucose, capillary     Status: Abnormal   Collection Time: 12/02/15  8:50 PM  Result Value Ref Range   Glucose-Capillary 146 (H) 65 - 99 mg/dL  Protime-INR     Status: Abnormal   Collection Time: 12/03/15  3:44 AM  Result Value Ref Range   Prothrombin Time 27.1 (H) 11.6 - 15.2 seconds   INR 2.55 (H) 0.00 - 1.49  Glucose, capillary     Status: Abnormal   Collection Time: 12/03/15  6:44 AM  Result Value Ref Range    Glucose-Capillary 131 (H) 65 - 99 mg/dL  Glucose, capillary     Status: Abnormal   Collection Time: 12/03/15 12:00 PM  Result Value Ref Range   Glucose-Capillary 148 (H) 65 - 99 mg/dL  Glucose, capillary     Status: Abnormal   Collection Time: 12/03/15  4:59 PM  Result Value Ref Range   Glucose-Capillary 142 (H) 65 - 99 mg/dL  Glucose, capillary     Status: Abnormal   Collection Time: 12/03/15  9:03 PM  Result Value Ref Range   Glucose-Capillary 180 (H) 65 - 99 mg/dL  Protime-INR     Status: Abnormal   Collection Time: 12/04/15  4:22 AM  Result Value Ref Range   Prothrombin Time 23.7 (H) 11.6 - 15.2 seconds   INR 2.14 (H) 0.00 - 1.49  Glucose, capillary     Status: None   Collection Time: 12/04/15  6:53 AM  Result Value Ref Range   Glucose-Capillary 89 65 - 99 mg/dL  Glucose, capillary     Status: Abnormal   Collection Time: 12/04/15 11:45 AM  Result Value Ref Range   Glucose-Capillary 137 (H) 65 - 99 mg/dL   Comment  1 Notify RN   Glucose, capillary     Status: Abnormal   Collection Time: 12/04/15  4:47 PM  Result Value Ref Range   Glucose-Capillary 140 (H) 65 - 99 mg/dL   Comment 1 Notify RN   Glucose, capillary     Status: None   Collection Time: 12/04/15  8:55 PM  Result Value Ref Range   Glucose-Capillary 90 65 - 99 mg/dL  Protime-INR     Status: Abnormal   Collection Time: 12/05/15  5:31 AM  Result Value Ref Range   Prothrombin Time 22.9 (H) 11.6 - 15.2 seconds   INR 2.03 (H) 0.00 - 1.49     HEENT: Normocephalic, atraumatic. Cardio: RRR and no murmur Resp: CTA B/L and unlabored GI: BS positive and NT, ND Skin:   Intact. Warm and dry Neuro: Alert/Oriented x3  Motor 4+/5 in RUE 2/5 RLE 5/5 LUE/LLE Dysarthric Musc/Skel:  No edema. No tenderness. Gen NAD. Vital signs reviewed.   Assessment/Plan: 1. Functional deficits secondary to bilateral posterior frontal and parietal infarct with R LE> UE paresis which require 3+ hours per day of interdisciplinary  therapy in a comprehensive inpatient rehab setting. Physiatrist is providing close team supervision and 24 hour management of active medical problems listed below. Physiatrist and rehab team continue to assess barriers to discharge/monitor patient progress toward functional and medical goals. FIM: Function - Bathing Position: Shower Body parts bathed by patient: Right arm, Left arm, Chest, Abdomen, Front perineal area, Right upper leg, Left upper leg Body parts bathed by helper: Right lower leg, Left lower leg, Buttocks, Back Assist Level:  (min A)  Function- Upper Body Dressing/Undressing What is the patient wearing?: Hospital gown Pull over shirt/dress - Perfomed by patient: Thread/unthread right sleeve, Thread/unthread left sleeve, Put head through opening Pull over shirt/dress - Perfomed by helper: Pull shirt over trunk Assist Level: Set up, Supervision or verbal cues Set up : To obtain clothing/put away Function - Lower Body Dressing/Undressing What is the patient wearing?: Socks, Shoes, AFO, Pants Position: Wheelchair/chair at sink Underwear - Performed by patient: Thread/unthread left underwear leg, Thread/unthread right underwear leg Underwear - Performed by helper: Thread/unthread right underwear leg Pants- Performed by patient: Thread/unthread right pants leg, Thread/unthread left pants leg, Pull pants up/down Pants- Performed by helper: Thread/unthread right pants leg, Pull pants up/down Non-skid slipper socks- Performed by patient: Don/doff right sock, Don/doff left sock Non-skid slipper socks- Performed by helper: Don/doff right sock Socks - Performed by patient: Don/doff right sock, Don/doff left sock Shoes - Performed by patient: Don/doff left shoe, Fasten left Shoes - Performed by helper: Fasten right, Don/doff right shoe AFO - Performed by helper: Don/doff right AFO TED Hose - Performed by helper: Don/doff right TED hose, Don/doff left TED hose Assist for footwear:  Partial/moderate assist Assist for lower body dressing: Touching or steadying assistance (Pt > 75%)  Function - Toileting Toileting activity did not occur: Safety/medical concerns Toileting steps completed by patient: Adjust clothing prior to toileting Toileting steps completed by helper: Performs perineal hygiene, Adjust clothing after toileting Toileting Assistive Devices: Grab bar or rail Assist level: Touching or steadying assistance (Pt.75%)  Function - Air cabin crew transfer assistive device: Elevated toilet seat/BSC over toilet Assist level to toilet: 2 helpers Assist level from toilet: Maximal assist (Pt 25 - 49%/lift and lower) Assist level to bedside commode (at bedside): Maximal assist (Pt 25 - 49%/lift and lower) Assist level from bedside commode (at bedside): Maximal assist (Pt 25 - 49%/lift and lower)  Function - Chair/bed transfer Chair/bed transfer activity did not occur: Refused Chair/bed transfer method: Stand pivot Chair/bed transfer assist level: Maximal assist (Pt 25 - 49%/lift and lower) Chair/bed transfer assistive device: Armrests, Orthosis Chair/bed transfer details: Verbal cues for technique, Verbal cues for precautions/safety, Verbal cues for sequencing, Manual facilitation for placement, Manual facilitation for weight bearing, Manual facilitation for weight shifting, Tactile cues for placement  Function - Locomotion: Wheelchair Will patient use wheelchair at discharge?: Yes Type: Manual Max wheelchair distance: 60 Assist Level: Touching or steadying assistance (Pt > 75%) Wheel 50 feet with 2 turns activity did not occur: Safety/medical concerns (lethargy,fatigue) Assist Level: Touching or steadying assistance (Pt > 75%) Wheel 150 feet activity did not occur: Safety/medical concerns (lethargy, fatigue) Turns around,maneuvers to table,bed, and toilet,negotiates 3% grade,maneuvers on rugs and over doorsills: No Function - Locomotion:  Ambulation Ambulation activity did not occur: Safety/medical concerns (unsafe due to knee buckling) Assistive device: Orthosis, Walker-Eva Max distance: 26 Assist level: Maximal assist (Pt 25 - 49%) Assist level: Maximal assist (Pt 25 - 49%)  Function - Comprehension Comprehension: Auditory Comprehension assistive device: Hearing aids Comprehension assist level: Follows basic conversation/direction with extra time/assistive device  Function - Expression Expression: Verbal Expression assist level: Expresses basic 75 - 89% of the time/requires cueing 10 - 24% of the time. Needs helper to occlude trach/needs to repeat words.  Function - Social Interaction Social Interaction assist level: Interacts appropriately 75 - 89% of the time - Needs redirection for appropriate language or to initiate interaction.  Function - Problem Solving Problem solving assist level: Solves basic 50 - 74% of the time/requires cueing 25 - 49% of the time  Function - Memory Memory assist level: Recognizes or recalls 50 - 74% of the time/requires cueing 25 - 49% of the time Patient normally able to recall (first 3 days only): Current season  Medical Problem List and Plan: 1.  Mobilty,cognitive and functional deficits secondary to bi-cerebral embolic CVA   - continue CIR 2.  DVT Prophylaxis/Anticoagulation: Pharmaceutical: Coumadin              -pt therapeutic now INR 2.03 3. Pain Management:  T#3 prn for headache, d/ced tramadol, if not better as BP normalizes will trial topamax       4. Mood: team to provide ego support 5. Neuropsych: This patient is not capable of making decisions on his own behalf--question of mild cognitive deficits at baseline.   6. Skin/Wound Care: routine pressure relief measures. Add supplements between meals.   7. Fluids/Electrolytes/Nutrition:  Monitor I/O. Meal intake improved, repeat BMP improved BMP Latest Ref Rng 12/02/2015 11/25/2015 11/23/2015  Glucose 65 - 99 mg/dL 159(H) 159(H)  131(H)  BUN 6 - 20 mg/dL 55(H) 51(H) 42(H)  Creatinine 0.61 - 1.24 mg/dL 2.80(H) 3.19(H) 3.37(H)  Sodium 135 - 145 mmol/L 136 132(L) 134(L)  Potassium 3.5 - 5.1 mmol/L 4.0 3.9 3.5  Chloride 101 - 111 mmol/L 103 97(L) 99(L)  CO2 22 - 32 mmol/L 23 19(L) 23  Calcium 8.9 - 10.3 mg/dL 8.7(L) 8.9 9.1    8. HTN:  -holding  , hctz, lisinopril,    restarted catapres .55m TID  Increased Cardizem to 300   Hydralazine TID scheduled dose,  increased to 25 mg on 5/6, increased to 50 mg on 5/7  Filed Vitals:   12/04/15 2100 12/05/15 0509  BP:  188/96  Pulse: 58 80  Temp:  98.1 F (36.7 C)  Resp:  17   9. A Fib: cardizem/warfarin. Continue  Cardizem 10 Hyponatremia: mild. Encourage appropriate nutrition 11. CKD: Follow i's and o's, repeat BMET stable creat sl better    12. H/o gout: allopurinol d/c due to increased creat, no flares 13. Gastric ulcers/GIB:  Protonix, monitor any bleeding while on warfarin 14.  Hematuria without dysuria,  UA neg except hematuria, C and S multispecies 15.  Elevated CBG, Hgb A1C 8.5 on 4/30. Improved on  amaryl  22m, increased to 4 mg on 5/6, am CBG 67 will reduce to 271mCBG (last 3)   Recent Labs  12/04/15 1145 12/04/15 1647 12/04/15 2055  GLUCAP 137* 140* 90  16.  Vascular dementia-will trial aricept although do not expect marked effect, add remeron, should help with sleep  LOS (Days) 13 A FACE TO FACE EVALUATION WAS PERFORMED  Patrick [name] E 12/05/2015, 7:06 AM

## 2015-12-05 NOTE — Progress Notes (Signed)
Speech Language Pathology Daily Session Note  Patient Details  Name: Patrick [name] MRN: [id number] Date of Birth: 1945-10-09  Today's Date: 12/05/2015 SLP Individual Time: [id number] SLP Individual Time Calculation (min): 41 min  Short Term Goals: Week 2: SLP Short Term Goal 1 (Week 2): Pt will recall basic, daily information with min assist verbal cues for use of external aids.  SLP Short Term Goal 2 (Week 2): Pt will attend to basic, familiar tasks for 30-45 minute intervals with supervision verbal cues for redirection.  SLP Short Term Goal 3 (Week 2): Pt will complete basic, familiar tasks with supervision verbal cues for functional problem solving  SLP Short Term Goal 4 (Week 2): Pt will identify at least 2 ways in which his current deficits s/p CVA will impact his functional independence in the home environment with mod assist.    Skilled Therapeutic Interventions:  Pt was seen for skilled ST targeting cognitive goals.  Pt alternated his attention between 2 basic, familiar tasks (self feeding and functional conversations with therapist) for 20 minutes in a moderately distracting environment with supervision verbal cues for redirection.  Pt was able to identify at least 3 ways in which his current limitations will impact his functional independence in the home environment with min assist question cues.  Pt was also able to recall at least 2 details from previous therapy session with min assist question cues.  Pt continues to present with flat affect but his participation was slightly improved today in comparison to previous therapy sessions.  Unclear if pt will have recommended level of support at discharge but pt is aware that team is recommending 24/7 supervision.  Continue per current plan of care.    Function:  Eating Eating   Modified Consistency Diet: No Eating Assist Level: More than reasonable amount of time           Cognition Comprehension Comprehension assist level: Follows  basic conversation/direction with extra time/assistive device  Expression   Expression assist level: Expresses basic 75 - 89% of the time/requires cueing 10 - 24% of the time. Needs helper to occlude trach/needs to repeat words.  Social Interaction Social Interaction assist level: Interacts appropriately 75 - 89% of the time - Needs redirection for appropriate language or to initiate interaction.  Problem Solving Problem solving assist level: Solves basic 50 - 74% of the time/requires cueing 25 - 49% of the time  Memory Memory assist level: Recognizes or recalls 50 - 74% of the time/requires cueing 25 - 49% of the time    Pain Pain Assessment Pain Assessment: No/denies pain  Therapy/Group: Individual Therapy  Ammara Raj, Selinda Orion 12/05/2015, 3:49 PM

## 2015-12-05 NOTE — Progress Notes (Signed)
Occupational Therapy Session Note  Patient Details  Name: Isaul Pietrangelo MRN: [id number] Date of Birth: 07-28-1946  Today's Date: 12/05/2015 OT Individual Time: [id number] OT Individual Time Calculation (min): 31 min    Skilled Therapeutic Interventions/Progress Updates:    Pt worked on Brewing technologist for the RLE during session.  Utilized RW for stand pivot transfers and mod assist with max facilitation to advance the LLE.  Worked on standing while attempting to advance the RLE forward with AFO applied and shoe cover to help reduce friction.  Mod assist needed to advance the RLE forward.  He demonstrates increased right knee, trunk, and cervical flexion in standing.  Pt transferred from wheelchair to recliner chair with mod assist at conclusion of the session.      Therapy Documentation Precautions:  Precautions Precautions: Fall Precaution Comments: RLE hemiparesis Restrictions Weight Bearing Restrictions: No  Pain: Pain Assessment Pain Assessment: No/denies pain ADL:  See Function Navigator for Current Functional Status.   Therapy/Group: Individual Therapy  Hasten Sweitzer OTR/L 12/05/2015, 4:11 PM

## 2015-12-05 NOTE — Progress Notes (Signed)
ANTICOAGULATION CONSULT NOTE - Follow Up Consult  Pharmacy Consult for coumadin Indication: atrial fibrillation  No Known Allergies  Patient Measurements: Height: 5' 9.5" (176.5 cm) Weight: 170 lb (77.111 kg) IBW/kg (Calculated) : 71.85 Heparin Dosing Weight:   Vital Signs: Temp: 98.1 F (36.7 C) (05/08 0509) Temp Source: Oral (05/08 0509) BP: 188/96 mmHg (05/08 0509) Pulse Rate: 80 (05/08 0509)  Labs:  Recent Labs  12/02/15 1016 12/03/15 0344 12/04/15 0422 12/05/15 0531  LABPROT  --  27.1* 23.7* 22.9*  INR  --  2.55* 2.14* 2.03*  CREATININE 2.80*  --   --   --     Estimated Creatinine Clearance: 25.7 mL/min (by C-G formula based on Cr of 2.8).   Medications:  Scheduled:  . aspirin EC  81 mg Oral Daily  . cloNIDine  0.3 mg Oral TID  . diclofenac sodium  4 g Topical QID  . diltiazem  300 mg Oral Daily  . donepezil  5 mg Oral QHS  . feeding supplement (NEPRO CARB STEADY)  237 mL Oral TID BM  . glimepiride  2 mg Oral Q breakfast  . hydrALAZINE  50 mg Oral Q8H  . insulin aspart  0-20 Units Subcutaneous TID WC  . insulin aspart  0-5 Units Subcutaneous QHS  . mirtazapine  7.5 mg Oral QHS  . polyethylene glycol  17 g Oral Daily  . senna-docusate  2 tablet Oral BID  . warfarin  2.5 mg Oral Once per day on Mon Thu  . warfarin  5 mg Oral Once per day on Sun Tue Wed Fri Sat  . Warfarin - Pharmacist Dosing Inpatient   Does not apply q1800   Infusions:    Assessment: 70 yo male with afib is currently on therapeutic coumadin.  INR today is 2.03  Goal of Therapy:  INR 2-3 Monitor platelets by anticoagulation protocol: Yes   Plan:  - continue coumadin 5 mg po daily, except 2.5 mg on Mondays and Thursdays - INR in am  Loukisha Gunnerson, Tsz-Yin 12/05/2015,8:15 AM

## 2015-12-05 NOTE — Progress Notes (Signed)
Occupational Therapy Session Note  Patient Details  Name: Patrick [name] MRN: [id number] Date of Birth: 07-18-1946  Today's Date: 12/05/2015 OT Individual Time: 0901-1001 OT Individual Time Calculation (min): 60 min    Short Term Goals: Week 2:  OT Short Term Goal 1 (Week 2): Pt will complete UB bathing with supervison sitting unsupported EOM or EOB.   OT Short Term Goal 2 (Week 2): Pt will perform LB bathing sit to stand with mod assist 2 consecutive sessions. OT Short Term Goal 3 (Week 2): Pt will donn pullover shirt with supervison in unsupported sitting. OT Short Term Goal 4 (Week 2): Pt will complete stand pivot transfers with LRAD and mod assist to the 3:1 or shower bench.  OT Short Term Goal 5 (Week 2): Pt will tolerate 30 mins B/D session with no more than 2 rest breaks.   Skilled Therapeutic Interventions/Progress Updates:    Pt completed shower, dressing, and toileting during session.  Mod assist for stand pivot transfer from the EOB and from the wheelchair to the toilet.  Max assist for stand step from the 3:1 over the toilet to the tub bench secondary to pt not being able to support himself with the RLE or advance it for stepping.  Bathing completed with mod assist overall.  Therapist having to assist with washing peri area while pt used the grab bars to remain standing.  He was able to complete donning underpants and pants over feet this session with supervision.  Managing to cross his RLE over the left knee with use of his UEs for starting clothing.  Mod assist for sit to stand with toileting tasks and pulling pants over hips for dressing.  BP 206/108 after transfer from wheelchair to the toilet.  Pt left in wheelchair with call button and phone in reach for next session.  No safety belt used this session.  Therapy Documentation Precautions:  Precautions Precautions: Fall Precaution Comments: RLE hemiparesis Restrictions Weight Bearing Restrictions: No  Pain: Pain  Assessment Pain Assessment: No/denies pain Pain Score: Asleep ADL: See Function Navigator for Current Functional Status.   Therapy/Group: Individual Therapy  Mico Spark OTR/L 12/05/2015, 11:53 AM

## 2015-12-06 ENCOUNTER — Inpatient Hospital Stay (HOSPITAL_COMMUNITY): Payer: Medicare Other | Admitting: Physical Therapy

## 2015-12-06 ENCOUNTER — Inpatient Hospital Stay (HOSPITAL_COMMUNITY): Payer: Medicare Other | Admitting: Occupational Therapy

## 2015-12-06 ENCOUNTER — Inpatient Hospital Stay (HOSPITAL_COMMUNITY): Payer: Medicare Other | Admitting: Speech Pathology

## 2015-12-06 LAB — PROTIME-INR
INR: 2.33 — ABNORMAL HIGH (ref 0.00–1.49)
PROTHROMBIN TIME: 25.3 s — AB (ref 11.6–15.2)

## 2015-12-06 LAB — GLUCOSE, CAPILLARY
GLUCOSE-CAPILLARY: 114 mg/dL — AB (ref 65–99)
Glucose-Capillary: 78 mg/dL (ref 65–99)
Glucose-Capillary: 168 mg/dL — ABNORMAL HIGH (ref 65–99)
Glucose-Capillary: 131 mg/dL — ABNORMAL HIGH (ref 65–99)

## 2015-12-06 NOTE — Plan of Care (Signed)
Problem: RH Ambulation Goal: LTG Patient will ambulate in home environment (PT) LTG: Patient will ambulate in home environment, # of feet with assistance (PT).  Outcome: Not Applicable Date Met:  81/44/81 D/C due to pt now SNF placement 12/06/15  Problem: RH Wheelchair Mobility Goal: LTG Patient will propel w/c in home environment (PT) LTG: Patient will propel wheelchair in home environment, # of feet with assistance (PT).  Outcome: Not Applicable Date Met:  85/63/14 D/C due to pt now SNF placement 12/06/15

## 2015-12-06 NOTE — Progress Notes (Signed)
Physical Therapy Session Note  Patient Details  Name: Patrick [name] MRN: [id number] Date of Birth: 1945-10-06  Today's Date: 12/06/2015 PT Individual Time: [id number] PT Individual Time Calculation (min): 67 min   Short Term Goals: Week 3:  PT Short Term Goal 1 (Week 3): =LTGs  Skilled Therapeutic Interventions/Progress Updates:   Pt received in recliner asleep; pt fatigued but willing to participate.  BP assessed before beginning session with BP within parameters.  Assisted pt with donning shoes and AFO.  Performed sit > stand from recliner with mod A for full anterior and L weight shift and performed stand pivot transfer recliner>w/c with UE support on RW with max A initially with facilitation of L lateral weight shift and advancement/placement of RLE but no assistance needed for stabilization as pt is experiencing consistent R mm activation in stance phase.  Performed w/c mobility training for general endurance and motor control of RUE x 100' with bilat UE propulsion with supervision and verbal cues for placement of RUE.  In gym pt participated in [place]; see below for details.  Pt required multiple seated rest breaks due to SOB and fatigue; BP assessed again due to changes in pt vision: 182/66, HR: 87 bpm.  Symptoms resolved after 1-2 minutes.  Returned to room and pt transferred w/c > recliner stand pivot with RW with pt progressing to mod A with improved initiation of L lateral weight shifting and advancement of RLE.  Pt left in recliner with all items within reach.   Therapy Documentation Precautions:  Precautions Precautions: Fall Precaution Comments: RLE hemiparesis Restrictions Weight Bearing Restrictions: No Vital Signs: Therapy Vitals Pulse Rate: 87 BP: (!) 182/66 mmHg Patient Position (if appropriate): Sitting Oxygen Therapy SpO2: 99 % O2 Device: Not Delivered Pain: Pain Assessment Pain Assessment: No/denies pain Other Treatments: Treatments Neuromuscular Facilitation:  Right;Left;Lower Extremity;Activity to increase coordination;Activity to increase motor control;Activity to increase timing and sequencing;Activity to increase grading;Activity to increase sustained activation;Activity to increase lateral weight shifting;Activity to increase anterior-posterior weight shifting standing with bilat UE support on // bars performing lateral stepping x 10' to L and R x 2 reps with focus on lateral weight shifting and activation of RLE pushing against weight block on floor to facilitate increased mm activation and mod A.  Continued with gait training x 10' with UE support on RW and mod-max A of therapist to facilitate full L lateral weight shifting and use of weighted block in front of R foot to facilitate initiation and activation of R swing phase; continues to require assistance to fully flex and clear foot from floor.     See Function Navigator for Current Functional Status.   Therapy/Group: Individual Therapy  Raylene Everts [facility] 12/06/2015, 2:23 PM

## 2015-12-06 NOTE — Progress Notes (Addendum)
Subjective/Complaints: No leg pain, has had swelling of both legs at home and kept legs elevated on pillow , did not use TEDs  ROS- denies any CP/SOB, N/V/D  Objective: Vital Signs: Blood pressure 167/86, pulse 67, temperature 98.6 F (37 C), temperature source Oral, resp. rate 19, height 5' 9.5" (1.765 m), weight 77.23 kg (170 lb 4.2 oz), SpO2 100 %. No results found. Results for orders placed or performed during the hospital encounter of 11/22/15 (from the past 72 hour(s))  Glucose, capillary     Status: Abnormal   Collection Time: 12/03/15 12:00 PM  Result Value Ref Range   Glucose-Capillary 148 (H) 65 - 99 mg/dL  Glucose, capillary     Status: Abnormal   Collection Time: 12/03/15  4:59 PM  Result Value Ref Range   Glucose-Capillary 142 (H) 65 - 99 mg/dL  Glucose, capillary     Status: Abnormal   Collection Time: 12/03/15  9:03 PM  Result Value Ref Range   Glucose-Capillary 180 (H) 65 - 99 mg/dL  Protime-INR     Status: Abnormal   Collection Time: 12/04/15  4:22 AM  Result Value Ref Range   Prothrombin Time 23.7 (H) 11.6 - 15.2 seconds   INR 2.14 (H) 0.00 - 1.49  Glucose, capillary     Status: None   Collection Time: 12/04/15  6:53 AM  Result Value Ref Range   Glucose-Capillary 89 65 - 99 mg/dL  Glucose, capillary     Status: Abnormal   Collection Time: 12/04/15 11:45 AM  Result Value Ref Range   Glucose-Capillary 137 (H) 65 - 99 mg/dL   Comment 1 Notify RN   Glucose, capillary     Status: Abnormal   Collection Time: 12/04/15  4:47 PM  Result Value Ref Range   Glucose-Capillary 140 (H) 65 - 99 mg/dL   Comment 1 Notify RN   Glucose, capillary     Status: None   Collection Time: 12/04/15  8:55 PM  Result Value Ref Range   Glucose-Capillary 90 65 - 99 mg/dL  Protime-INR     Status: Abnormal   Collection Time: 12/05/15  5:31 AM  Result Value Ref Range   Prothrombin Time 22.9 (H) 11.6 - 15.2 seconds   INR 2.03 (H) 0.00 - 1.49  Glucose, capillary     Status: Abnormal    Collection Time: 12/05/15  6:41 AM  Result Value Ref Range   Glucose-Capillary 61 (L) 65 - 99 mg/dL  Glucose, capillary     Status: Abnormal   Collection Time: 12/05/15  7:01 AM  Result Value Ref Range   Glucose-Capillary 100 (H) 65 - 99 mg/dL  Glucose, capillary     Status: Abnormal   Collection Time: 12/05/15 11:34 AM  Result Value Ref Range   Glucose-Capillary 262 (H) 65 - 99 mg/dL   Comment 1 Notify RN   Glucose, capillary     Status: Abnormal   Collection Time: 12/05/15  4:52 PM  Result Value Ref Range   Glucose-Capillary 48 (L) 65 - 99 mg/dL   Comment 1 Notify RN   Glucose, capillary     Status: None   Collection Time: 12/05/15  5:08 PM  Result Value Ref Range   Glucose-Capillary 76 65 - 99 mg/dL   Comment 1 Notify RN   Glucose, capillary     Status: Abnormal   Collection Time: 12/05/15  8:54 PM  Result Value Ref Range   Glucose-Capillary 191 (H) 65 - 99 mg/dL  Protime-INR  Status: Abnormal   Collection Time: 12/06/15  4:11 AM  Result Value Ref Range   Prothrombin Time 25.3 (H) 11.6 - 15.2 seconds   INR 2.33 (H) 0.00 - 1.49  Glucose, capillary     Status: None   Collection Time: 12/06/15  6:49 AM  Result Value Ref Range   Glucose-Capillary 78 65 - 99 mg/dL     HEENT: Normocephalic, atraumatic. Cardio: RRR and no murmur Resp: CTA B/L and unlabored GI: BS positive and NT, ND Skin:   Intact. Warm and dry Neuro: Alert/Oriented x3  Motor 4+/5 in RUE 2/5 RLE 5/5 LUE/LLE Dysarthric Musc/Skel:  No edema. No tenderness. Gen NAD. Vital signs reviewed. 2+ edema in RLE,\1+ in LLE Calf NT, neg Homan  Assessment/Plan: 1. Functional deficits secondary to bilateral posterior frontal and parietal infarct with R LE> UE paresis which require 3+ hours per day of interdisciplinary therapy in a comprehensive inpatient rehab setting. Physiatrist is providing close team supervision and 24 hour management of active medical problems listed below. Physiatrist and rehab team  continue to assess barriers to discharge/monitor patient progress toward functional and medical goals. FIM: Function - Bathing Position: Shower Body parts bathed by patient: Right arm, Left arm, Chest, Abdomen, Front perineal area, Right upper leg, Left upper leg, Right lower leg, Left lower leg Body parts bathed by helper: Buttocks, Back Assist Level:  (min A)  Function- Upper Body Dressing/Undressing What is the patient wearing?: Hospital gown Pull over shirt/dress - Perfomed by patient: Thread/unthread right sleeve, Thread/unthread left sleeve, Put head through opening, Pull shirt over trunk Pull over shirt/dress - Perfomed by helper: Pull shirt over trunk Assist Level: Supervision or verbal cues Set up : To obtain clothing/put away Function - Lower Body Dressing/Undressing What is the patient wearing?: Socks, Shoes, AFO, Pants, Underwear Position: Wheelchair/chair at sink Underwear - Performed by patient: Thread/unthread left underwear leg, Thread/unthread right underwear leg Underwear - Performed by helper: Pull underwear up/down Pants- Performed by patient: Thread/unthread right pants leg, Thread/unthread left pants leg Pants- Performed by helper: Pull pants up/down Non-skid slipper socks- Performed by patient: Don/doff right sock, Don/doff left sock Non-skid slipper socks- Performed by helper: Don/doff right sock Socks - Performed by patient: Don/doff right sock, Don/doff left sock Shoes - Performed by patient: Fasten left, Don/doff left shoe Shoes - Performed by helper: Don/doff right shoe, Fasten right AFO - Performed by helper: Don/doff right AFO TED Hose - Performed by helper: Don/doff right TED hose, Don/doff left TED hose Assist for footwear: Partial/moderate assist Assist for lower body dressing: Touching or steadying assistance (Pt > 75%)  Function - Toileting Toileting activity did not occur: Safety/medical concerns Toileting steps completed by patient: Adjust  clothing prior to toileting Toileting steps completed by helper: Adjust clothing prior to toileting, Performs perineal hygiene, Adjust clothing after toileting Toileting Assistive Devices: Grab bar or rail Assist level: Touching or steadying assistance (Pt.75%)  Function - Air cabin crew transfer assistive device: Elevated toilet seat/BSC over toilet Assist level to toilet: Moderate assist (Pt 50 - 74%/lift or lower) Assist level from toilet: Moderate assist (Pt 50 - 74%/lift or lower) Assist level to bedside commode (at bedside): Maximal assist (Pt 25 - 49%/lift and lower) Assist level from bedside commode (at bedside): Maximal assist (Pt 25 - 49%/lift and lower)  Function - Chair/bed transfer Chair/bed transfer activity did not occur: Refused Chair/bed transfer method: Squat pivot Chair/bed transfer assist level: Moderate assist (Pt 50 - 74%/lift or lower) Chair/bed transfer assistive device: Orthosis,  Armrests Chair/bed transfer details: Verbal cues for technique, Verbal cues for precautions/safety, Verbal cues for sequencing, Manual facilitation for placement, Manual facilitation for weight bearing, Manual facilitation for weight shifting, Tactile cues for placement  Function - Locomotion: Wheelchair Will patient use wheelchair at discharge?: Yes Type: Manual Max wheelchair distance: 50 Assist Level: Touching or steadying assistance (Pt > 75%) Wheel 50 feet with 2 turns activity did not occur: Safety/medical concerns (lethargy,fatigue) Assist Level: Touching or steadying assistance (Pt > 75%) Wheel 150 feet activity did not occur: Safety/medical concerns (lethargy, fatigue) Turns around,maneuvers to table,bed, and toilet,negotiates 3% grade,maneuvers on rugs and over doorsills: No Function - Locomotion: Ambulation Ambulation activity did not occur: Safety/medical concerns (unsafe due to knee buckling) Assistive device: Orthosis, Walker-Eva Max distance: 20 Assist level:  Maximal assist (Pt 25 - 49%) Assist level: Maximal assist (Pt 25 - 49%)  Function - Comprehension Comprehension: Auditory Comprehension assistive device: Hearing aids Comprehension assist level: Follows basic conversation/direction with extra time/assistive device  Function - Expression Expression: Verbal Expression assist level: Expresses basic 75 - 89% of the time/requires cueing 10 - 24% of the time. Needs helper to occlude trach/needs to repeat words.  Function - Social Interaction Social Interaction assist level: Interacts appropriately 75 - 89% of the time - Needs redirection for appropriate language or to initiate interaction.  Function - Problem Solving Problem solving assist level: Solves basic 50 - 74% of the time/requires cueing 25 - 49% of the time  Function - Memory Memory assist level: Recognizes or recalls 50 - 74% of the time/requires cueing 25 - 49% of the time Patient normally able to recall (first 3 days only): Current season  Medical Problem List and Plan: 1.  Mobilty,cognitive and functional deficits secondary to bi-cerebral embolic CVA   - continue CIR 2.  DVT Prophylaxis/Anticoagulation: Pharmaceutical: Coumadin              -pt has been therapeutic or supra therapeutic on Warfarin   3. Pain Management:  T#3 prn for headache, d/ced tramadol, if not better as BP normalizes will trial topamax       4. Mood: team to provide ego support 5. Neuropsych: This patient is not capable of making decisions on his own behalf--question of mild cognitive deficits at baseline.   6. Skin/Wound Care: routine pressure relief measures. Add supplements between meals.   7. Fluids/Electrolytes/Nutrition:  Monitor I/O. Meal intake improved, repeat BMP improved BMP Latest Ref Rng 12/02/2015 11/25/2015 11/23/2015  Glucose 65 - 99 mg/dL 159(H) 159(H) 131(H)  BUN 6 - 20 mg/dL 55(H) 51(H) 42(H)  Creatinine 0.61 - 1.24 mg/dL 2.80(H) 3.19(H) 3.37(H)  Sodium 135 - 145 mmol/L 136 132(L) 134(L)   Potassium 3.5 - 5.1 mmol/L 4.0 3.9 3.5  Chloride 101 - 111 mmol/L 103 97(L) 99(L)  CO2 22 - 32 mmol/L 23 19(L) 23  Calcium 8.9 - 10.3 mg/dL 8.7(L) 8.9 9.1    8. HTN:  -holding  , hctz, lisinopril,    restarted catapres .3mg  TID  Increased Cardizem to 300   Hydralazine TID scheduled dose,  increased to 25 mg on 5/6, increased to 50 mg on 5/7  Filed Vitals:   12/05/15 1403 12/06/15 0551  BP: 156/65 167/86  Pulse: 47 67  Temp: 98 F (36.7 C) 98.6 F (37 C)  Resp: 20 19   9. A Fib: cardizem/warfarin. Continue Cardizem 10 Hyponatremia: mild. Encourage appropriate nutrition 11. CKD: Follow i's and o's, repeat BMET stable creat sl better    12. H/o  gout: allopurinol d/c due to increased creat, no flares 13. Gastric ulcers/GIB:  Protonix, monitor any bleeding while on warfarin 14.  Hematuria without dysuria,  UA neg except hematuria, C and S multispecies 15.  Elevated CBG, Hgb A1C 8.5 on 4/30. Improved on  amaryl  2mg , increased to 4 mg on 5/6, am CBG 67 will reduce to 2mg  CBG (last 3)   Recent Labs  12/05/15 1708 12/05/15 2054 12/06/15 0649  GLUCAP 76 191* 78  16.  Vascular dementia-will trial aricept although do not expect marked effect, add remeron, should help with sleep 17.  LE edema R>L venous insuff, renal disease and effect of meds, likely hydralazine, hesitate to use diuretic due to renal fxn, will elevate , order teds LOS (Days) 14 A FACE TO FACE EVALUATION WAS PERFORMED  KIRSTEINS,ANDREW E 12/06/2015, 6:57 AM

## 2015-12-06 NOTE — Progress Notes (Addendum)
ANTICOAGULATION CONSULT NOTE - Follow Up Consult  Pharmacy Consult for coumadin Indication: atrial fibrillation  No Known Allergies  Patient Measurements: Height: 5' 9.5" (176.5 cm) Weight: 170 lb 4.2 oz (77.23 kg) IBW/kg (Calculated) : 71.85 Heparin Dosing Weight:   Vital Signs: Temp: 98.6 F (37 C) (05/09 0551) Temp Source: Oral (05/09 0551) BP: 167/86 mmHg (05/09 0551) Pulse Rate: 67 (05/09 0551)  Labs:  Recent Labs  12/04/15 0422 12/05/15 0531 12/06/15 0411  LABPROT 23.7* 22.9* 25.3*  INR 2.14* 2.03* 2.33*    Estimated Creatinine Clearance: 25.7 mL/min (by C-G formula based on Cr of 2.8).   Medications:  Scheduled:  . aspirin EC  81 mg Oral Daily  . cloNIDine  0.3 mg Oral TID  . diclofenac sodium  4 g Topical QID  . diltiazem  300 mg Oral Daily  . donepezil  5 mg Oral QHS  . feeding supplement (NEPRO CARB STEADY)  237 mL Oral TID BM  . glimepiride  2 mg Oral Q breakfast  . hydrALAZINE  50 mg Oral Q8H  . insulin aspart  0-20 Units Subcutaneous TID WC  . insulin aspart  0-5 Units Subcutaneous QHS  . mirtazapine  7.5 mg Oral QHS  . polyethylene glycol  17 g Oral Daily  . senna-docusate  2 tablet Oral BID  . warfarin  2.5 mg Oral Once per day on Mon Thu  . warfarin  5 mg Oral Once per day on Sun Tue Wed Fri Sat  . Warfarin - Pharmacist Dosing Inpatient   Does not apply q1800   Infusions:    Assessment: 70 yo male with afib is currently on therapeutic coumadin.  INR today is 2.33.   Goal of Therapy:  INR 2-3 Monitor platelets by anticoagulation protocol: Yes   Plan:  - continue coumadin 5 mg po daily except 2.5 mg on Mon and Thursdays - If INR is good tom, will change to MWF  Carlyle Achenbach, Tsz-Yin 12/06/2015,8:06 AM

## 2015-12-06 NOTE — Progress Notes (Signed)
Occupational Therapy Weekly Progress Note  Patient Details  Name: Patrick [name] MRN: [id number] Date of Birth: [birth year]  Beginning of progress report period: Nov 30, 2015 End of progress report period: Dec 06, 2015  Today's Date: 12/06/2015 OT Individual Time: 0900-1000 OT Individual Time Calculation (min): 60 min    Patient has met 5 of 5 short term goals.  Patrick [name] has demonstrated small gains with regards to ADL function and endurance.  He can maintain static and dynamic sitting balance EOB or wheelchair with supervision during UB selfcare.  He continues to need 2-3 rest breaks during 45 mins session secondary to fatigue, but continues to make improvement.  Sit to stand for LB selfcare tasks has improved to a mod assist level overall with some times reaching min.  Mod instructional cueing is needed for hand placement however as he continues to attempt to pull up on the sink or the RW at times.  He is doing better at managing his RLE for dressing tasks.  Still with very little active movement in the RLE for ambulation or transfers, but he can use both UEs to cross it over his left knee for dressing and washing the foot.  Mod assist needed to advance the RLE during transfers with use of the RW for support.  Mod assist also needed for facilitation at the right knee and hip as well with standing when attempting to pull pants over hips.  Feel pt is still going to need 24 hour physical assist at home.  Unsure if care can be arranged from sons and his spouse.  Will continue working toward established min assist LTGs and meet with MD to discuss progress and discharge.    Patient continues to demonstrate the following deficits: decreased balance, decreased endurance, decreased RUE and RLE strength, decreased memory,  and therefore will continue to benefit from skilled OT intervention to enhance overall performance with BADL.  Patient progressing toward long term goals..  Continue plan of care.  OT Short  Term Goals Week 3:  OT Short Term Goal 1 (Week 3): Continue working on min assist level goals overall.   Skilled Therapeutic Interventions/Progress Updates:    Pt decided to complete sponge bath sit to stand for session.  Mod assist for transfer to the wheelchair squat pivot from the bed.  He was able to complete most bathing with overall min assist except for maintaining standing balance when washing peri area.  Increased knee flexion in the RLE and increased lean backwards in standing requiring mod assist.  He was able to self manage the RLE for washing the right foot and applying clothing.  He demonstrated decreased endurance, requiring 3-4 rest breaks throughout session.  Once he finished he was transferred to the recliner to rest.  Safety belt and call button in reach.    Therapy Documentation Precautions:  Precautions Precautions: Fall Precaution Comments: RLE hemiparesis Restrictions Weight Bearing Restrictions: No  Pain: Pain Assessment Pain Assessment: No/denies pain ADL: See Function Navigator for Current Functional Status.   Therapy/Group: Individual Therapy  Patrick [name] OTR/L 12/06/2015, 12:30 PM

## 2015-12-06 NOTE — Progress Notes (Signed)
Speech Language Pathology Daily Session Note  Patient Details  Name: Patrick [name] MRN: [id number] Date of Birth: [birth year]  Today's Date: 12/06/2015 SLP Individual Time: 1103-1200 SLP Individual Time Calculation (min): 57 min  Short Term Goals: Week 2: SLP Short Term Goal 1 (Week 2): Pt will recall basic, daily information with min assist verbal cues for use of external aids.  SLP Short Term Goal 2 (Week 2): Pt will attend to basic, familiar tasks for 30-45 minute intervals with supervision verbal cues for redirection.  SLP Short Term Goal 3 (Week 2): Pt will complete basic, familiar tasks with supervision verbal cues for functional problem solving  SLP Short Term Goal 4 (Week 2): Pt will identify at least 2 ways in which his current deficits s/p CVA will impact his functional independence in the home environment with mod assist.    Skilled Therapeutic Interventions: Skilled treatment session focused on addressing cognition goals. Patient oriented to month and year but required Mod cues to utilize external aids for recall of day and date.  Sons present at beginning of session and told patient where they were going prior to SLP and patient leaving room for therapy.  Following a ~15 minute delay patient was only able to recall 1 of the two locations.  Patient able to recall one deficit post CVA; Max assist question cues to identify a second deficit were not effective.  Patient appeared flat with simple verbal responses throughout session.  SLP questioned interests, hobbies and work history to engage in session and develop therapy tasks to align with patient preference.  During this exchange patient reported being able to remember info to answer questions, but was experiencing difficulty finding the word.   Recommend to continue to monitor over next few sessions.  Patient required Mod question cues to route find back to room at end of session.  Continue with current plan of  care.  Function:  Cognition Comprehension Comprehension assist level: Understands basic 90% of the time/cues < 10% of the time  Expression   Expression assist level: Expresses basic 75 - 89% of the time/requires cueing 10 - 24% of the time. Needs helper to occlude trach/needs to repeat words.  Social Interaction Social Interaction assist level: Interacts appropriately 25 - 49% of time - Needs frequent redirection.  Problem Solving Problem solving assist level: Solves basic 25 - 49% of the time - needs direction more than half the time to initiate, plan or complete simple activities  Memory Memory assist level: Recognizes or recalls 50 - 74% of the time/requires cueing 25 - 49% of the time    Pain Pain Assessment Pain Assessment: No/denies pain  Therapy/Group: Individual Therapy  Carmelia Roller., CCC-SLP [id number]  [place] 12/06/2015, 11:55 AM

## 2015-12-06 NOTE — Progress Notes (Signed)
Social Work Patient ID: Oneal Schoenberger, male   DOB: Apr 22, 1946, 70 y.o.   MRN: [id number]   CSW met with pt's son, Roselyn Reef, regarding FMLA paperwork.  CSW found it on [employer], [place] desk and she will complete it after she meets with him.  CSW talked with Roselyn Reef also about pt's d/c plan.  He stated that he believes pt will need to go to a SNF after Rehab as family cannot take care of pt and his wife, who is also recovering from a stroke.  CSW will work with family on SNF placement and await medically stability to proceed.

## 2015-12-07 ENCOUNTER — Inpatient Hospital Stay (HOSPITAL_COMMUNITY): Payer: Medicare Other | Admitting: Speech Pathology

## 2015-12-07 ENCOUNTER — Inpatient Hospital Stay (HOSPITAL_COMMUNITY): Payer: Medicare Other | Admitting: Occupational Therapy

## 2015-12-07 ENCOUNTER — Inpatient Hospital Stay (HOSPITAL_COMMUNITY): Payer: Medicare Other

## 2015-12-07 LAB — PROTIME-INR
INR: 2.17 — ABNORMAL HIGH (ref 0.00–1.49)
Prothrombin Time: 24 seconds — ABNORMAL HIGH (ref 11.6–15.2)

## 2015-12-07 LAB — GLUCOSE, CAPILLARY
Glucose-Capillary: 75 mg/dL (ref 65–99)
Glucose-Capillary: 137 mg/dL — ABNORMAL HIGH (ref 65–99)
Glucose-Capillary: 260 mg/dL — ABNORMAL HIGH (ref 65–99)
Glucose-Capillary: 77 mg/dL (ref 65–99)

## 2015-12-07 MED ORDER — HYDROCHLOROTHIAZIDE 10 MG/ML ORAL SUSPENSION
6.2500 mg | Freq: Every day | ORAL | Status: DC
  Administered 2015-12-07 – 2015-12-09 (×3): 6.25 mg via ORAL
  Filled 2015-12-07 (×3): qty 1.25

## 2015-12-07 MED ORDER — ACETAMINOPHEN 325 MG PO TABS: 650.0000 mg | ORAL_TABLET | Freq: Four times a day (QID) | ORAL | Status: DC | PRN

## 2015-12-07 NOTE — Progress Notes (Signed)
Occupational Therapy Session Note  Patient Details  Name: Patrick [name] MRN: [id number] Date of Birth: 06/19/1946  Today's Date: 12/07/2015 OT Individual Time: 1300-1359 OT Individual Time Calculation (min): 59 min    Short Term Goals: Week 2:  OT Short Term Goal 1 (Week 2): Pt will complete UB bathing with supervison sitting unsupported EOM or EOB.   OT Short Term Goal 1 - Progress (Week 2): Met OT Short Term Goal 2 (Week 2): Pt will perform LB bathing sit to stand with mod assist 2 consecutive sessions. OT Short Term Goal 2 - Progress (Week 2): Met OT Short Term Goal 3 (Week 2): Pt will donn pullover shirt with supervison in unsupported sitting. OT Short Term Goal 3 - Progress (Week 2): Met OT Short Term Goal 4 (Week 2): Pt will complete stand pivot transfers with LRAD and mod assist to the 3:1 or shower bench.  OT Short Term Goal 4 - Progress (Week 2): Met OT Short Term Goal 5 (Week 2): Pt will tolerate 30 mins B/D session with no more than 2 rest breaks.  OT Short Term Goal 5 - Progress (Week 2): Met  Skilled Therapeutic Interventions/Progress Updates:  Bathing and dressing sit to stand from the wheelchair level during session.  Mod assist for sit to stand with mod instructional cueing for hand placement.  Pt unable to maintain right knee extension during standing, requiring mod assist.  He was able to self manage his LEs for crossing them over the opposite knee when washing his feet and dressing.  Increased rest breaks needed during this session with 4-5 total secondary to fatigue.  Finished session with pt working on self feeding using the RUE as dominant hand.     Therapy Documentation Precautions:  Precautions Precautions: Fall Precaution Comments: RLE hemiparesis Restrictions Weight Bearing Restrictions: No  Pain: Pain Assessment Pain Assessment: No/denies pain ADL: See Function Navigator for Current Functional Status.   Therapy/Group: Individual Therapy  Jowana Thumma  OTR/L 12/07/2015, 3:48 PM

## 2015-12-07 NOTE — Progress Notes (Signed)
Physical Therapy Note  Patient Details  Name: Patrick [name] MRN: [id number] Date of Birth: 1946-03-13 Today's Date: 12/07/2015  0800-0930, 90 min individual tx  Pt c/o bil LE pain. Moderate edema noted Bil LEs, ? Pitting.  RN notified.  Rolling and supine> sit with min assist, max cues.  Sit>< stand x 2 from raised bed, RAFO on, and lateral wt shifting with mod assist.  No active motors felt in RLE.  neuromuscular re-education via forced use, manual cues for: W/c propulsion using bil UEs, unable to use LLE effectively.  Pt rested q 20' when propelling. Seated R hip abduction and knee ext attempting using visual target for stimulation; pt demonstrated active hip motions 1/10 for each direction. NuStep level 3 x 5 minutes x 4 extremities. In supine with strap around bil thighs:  bil bridging with assistance; lower trunk rotation.  PROM R hamstrings and heel cord.   Pt demonstrated difficulty moving LLE today; stated it felt heavy.  PT more DOE today vs 12/05/15 when this PT last worked with him.  See vitals.  RN stated pt was ok to proceed with gait if pt feels up to it.  Gait with RW with assistance for R knee stability and advancement.  Pt demonstrates dropping of pelvic on R with appropriate hip and knee flexion, but is unable to advance it.  Pt very fatigued; returned to bed via squat pivot to L with improved body mechanics for head/hips relationship. Left resting in bed with all needs within reach and bed alarm set.  Son Roselyn Reef observed tx today.  Ruthann Angulo 12/07/2015, 7:46 AM

## 2015-12-07 NOTE — Progress Notes (Signed)
Subjective/Complaints: Slept well no issues overnite  ROS- denies any CP/SOB, N/V/D  Objective: Vital Signs: Blood pressure 198/96, pulse 69, temperature 98.3 F (36.8 C), temperature source Oral, resp. rate 19, height 5' 9.5" (1.765 m), weight 77 kg (169 lb 12.1 oz), SpO2 100 %. No results found. Results for orders placed or performed during the hospital encounter of 11/22/15 (from the past 72 hour(s))  Glucose, capillary     Status: Abnormal   Collection Time: 12/04/15 11:45 AM  Result Value Ref Range   Glucose-Capillary 137 (H) 65 - 99 mg/dL   Comment 1 Notify RN   Glucose, capillary     Status: Abnormal   Collection Time: 12/04/15  4:47 PM  Result Value Ref Range   Glucose-Capillary 140 (H) 65 - 99 mg/dL   Comment 1 Notify RN   Glucose, capillary     Status: None   Collection Time: 12/04/15  8:55 PM  Result Value Ref Range   Glucose-Capillary 90 65 - 99 mg/dL  Protime-INR     Status: Abnormal   Collection Time: 12/05/15  5:31 AM  Result Value Ref Range   Prothrombin Time 22.9 (H) 11.6 - 15.2 seconds   INR 2.03 (H) 0.00 - 1.49  Glucose, capillary     Status: Abnormal   Collection Time: 12/05/15  6:41 AM  Result Value Ref Range   Glucose-Capillary 61 (L) 65 - 99 mg/dL  Glucose, capillary     Status: Abnormal   Collection Time: 12/05/15  7:01 AM  Result Value Ref Range   Glucose-Capillary 100 (H) 65 - 99 mg/dL  Glucose, capillary     Status: Abnormal   Collection Time: 12/05/15 11:34 AM  Result Value Ref Range   Glucose-Capillary 262 (H) 65 - 99 mg/dL   Comment 1 Notify RN   Glucose, capillary     Status: Abnormal   Collection Time: 12/05/15  4:52 PM  Result Value Ref Range   Glucose-Capillary 48 (L) 65 - 99 mg/dL   Comment 1 Notify RN   Glucose, capillary     Status: None   Collection Time: 12/05/15  5:08 PM  Result Value Ref Range   Glucose-Capillary 76 65 - 99 mg/dL   Comment 1 Notify RN   Glucose, capillary     Status: Abnormal   Collection Time: 12/05/15   8:54 PM  Result Value Ref Range   Glucose-Capillary 191 (H) 65 - 99 mg/dL  Protime-INR     Status: Abnormal   Collection Time: 12/06/15  4:11 AM  Result Value Ref Range   Prothrombin Time 25.3 (H) 11.6 - 15.2 seconds   INR 2.33 (H) 0.00 - 1.49  Glucose, capillary     Status: None   Collection Time: 12/06/15  6:49 AM  Result Value Ref Range   Glucose-Capillary 78 65 - 99 mg/dL  Glucose, capillary     Status: Abnormal   Collection Time: 12/06/15 12:00 PM  Result Value Ref Range   Glucose-Capillary 168 (H) 65 - 99 mg/dL  Glucose, capillary     Status: Abnormal   Collection Time: 12/06/15  4:49 PM  Result Value Ref Range   Glucose-Capillary 131 (H) 65 - 99 mg/dL  Glucose, capillary     Status: Abnormal   Collection Time: 12/06/15  9:18 PM  Result Value Ref Range   Glucose-Capillary 114 (H) 65 - 99 mg/dL  Protime-INR     Status: Abnormal   Collection Time: 12/07/15  5:26 AM  Result Value Ref Range  Prothrombin Time 24.0 (H) 11.6 - 15.2 seconds   INR 2.17 (H) 0.00 - 1.49  Glucose, capillary     Status: None   Collection Time: 12/07/15  6:40 AM  Result Value Ref Range   Glucose-Capillary 75 65 - 99 mg/dL     HEENT: Normocephalic, atraumatic. Cardio: RRR and no murmur Resp: CTA B/L and unlabored GI: BS positive and NT, ND Skin:   Intact. Warm and dry Neuro: Alert/Oriented x3  Motor 4+/5 in RUE 2/5 RLE 5/5 LUE/LLE Dysarthric Musc/Skel:  No edema. No tenderness. Gen NAD. Vital signs reviewed. 2+ edema in RLE,\1+ in LLE Calf NT, neg Homan  Assessment/Plan: 1. Functional deficits secondary to bilateral posterior frontal and parietal infarct with R LE> UE paresis which require 3+ hours per day of interdisciplinary therapy in a comprehensive inpatient rehab setting. Physiatrist is providing close team supervision and 24 hour management of active medical problems listed below. Physiatrist and rehab team continue to assess barriers to discharge/monitor patient progress toward  functional and medical goals. FIM: Function - Bathing Position: Wheelchair/chair at sink Body parts bathed by patient: Right arm, Left arm, Chest, Abdomen, Front perineal area, Buttocks, Right upper leg, Left upper leg, Right lower leg, Left lower leg Body parts bathed by helper: Buttocks, Back Bathing not applicable: Back Assist Level:  (min A)  Function- Upper Body Dressing/Undressing What is the patient wearing?: Hospital gown Pull over shirt/dress - Perfomed by patient: Thread/unthread right sleeve, Thread/unthread left sleeve, Put head through opening, Pull shirt over trunk Pull over shirt/dress - Perfomed by helper: Pull shirt over trunk Assist Level: Supervision or verbal cues Set up : To obtain clothing/put away Function - Lower Body Dressing/Undressing What is the patient wearing?: Socks, Shoes, AFO, Pants, Underwear Position: Wheelchair/chair at sink Underwear - Performed by patient: Thread/unthread left underwear leg, Thread/unthread right underwear leg Underwear - Performed by helper: Pull underwear up/down Pants- Performed by patient: Thread/unthread right pants leg, Thread/unthread left pants leg, Pull pants up/down Pants- Performed by helper: Pull pants up/down Non-skid slipper socks- Performed by patient: Don/doff right sock, Don/doff left sock Non-skid slipper socks- Performed by helper: Don/doff right sock Socks - Performed by patient: Don/doff right sock, Don/doff left sock Shoes - Performed by patient: Fasten left, Don/doff left shoe Shoes - Performed by helper: Don/doff right shoe, Fasten right AFO - Performed by helper: Don/doff right AFO TED Hose - Performed by helper: Don/doff right TED hose, Don/doff left TED hose Assist for footwear: Partial/moderate assist Assist for lower body dressing: Touching or steadying assistance (Pt > 75%)  Function - Toileting Toileting activity did not occur: Safety/medical concerns Toileting steps completed by patient: Adjust  clothing prior to toileting Toileting steps completed by helper: Adjust clothing prior to toileting, Performs perineal hygiene, Adjust clothing after toileting Toileting Assistive Devices: Grab bar or rail Assist level: Touching or steadying assistance (Pt.75%)  Function - Air cabin crew transfer assistive device: Elevated toilet seat/BSC over toilet Assist level to toilet: Moderate assist (Pt 50 - 74%/lift or lower) Assist level from toilet: Moderate assist (Pt 50 - 74%/lift or lower) Assist level to bedside commode (at bedside): Maximal assist (Pt 25 - 49%/lift and lower) Assist level from bedside commode (at bedside): Maximal assist (Pt 25 - 49%/lift and lower)  Function - Chair/bed transfer Chair/bed transfer activity did not occur: Refused Chair/bed transfer method: Stand pivot Chair/bed transfer assist level: Moderate assist (Pt 50 - 74%/lift or lower) Chair/bed transfer assistive device: Orthosis, Walker Chair/bed transfer details: Verbal cues for  technique, Verbal cues for precautions/safety, Verbal cues for sequencing, Manual facilitation for placement, Manual facilitation for weight bearing, Manual facilitation for weight shifting, Tactile cues for placement  Function - Locomotion: Wheelchair Will patient use wheelchair at discharge?: Yes Type: Manual Max wheelchair distance: 100 Assist Level: Supervision or verbal cues Wheel 50 feet with 2 turns activity did not occur: Safety/medical concerns (lethargy,fatigue) Assist Level: Supervision or verbal cues Wheel 150 feet activity did not occur: Safety/medical concerns (lethargy, fatigue) Turns around,maneuvers to table,bed, and toilet,negotiates 3% grade,maneuvers on rugs and over doorsills: No Function - Locomotion: Ambulation Ambulation activity did not occur: Safety/medical concerns (unsafe due to knee buckling) Assistive device: Walker-rolling, Orthosis Max distance: 10 Assist level: Moderate assist (Pt 50 -  74%) Assist level: Moderate assist (Pt 50 - 74%)  Function - Comprehension Comprehension: Auditory Comprehension assistive device: Hearing aids Comprehension assist level: Understands basic 90% of the time/cues < 10% of the time  Function - Expression Expression: Verbal Expression assist level: Expresses basic 75 - 89% of the time/requires cueing 10 - 24% of the time. Needs helper to occlude trach/needs to repeat words.  Function - Social Interaction Social Interaction assist level: Interacts appropriately 25 - 49% of time - Needs frequent redirection.  Function - Problem Solving Problem solving assist level: Solves basic 25 - 49% of the time - needs direction more than half the time to initiate, plan or complete simple activities  Function - Memory Memory assist level: Recognizes or recalls 50 - 74% of the time/requires cueing 25 - 49% of the time Patient normally able to recall (first 3 days only): Current season  Medical Problem List and Plan: 1.  Mobilty,cognitive and functional deficits secondary to bi-cerebral embolic CVA   - Team conference today please see physician documentation under team conference tab, met with team face-to-face to discuss problems,progress, and goals. Formulized individual treatment plan based on medical history, underlying problem and comorbidities. 2.  DVT Prophylaxis/Anticoagulation: Pharmaceutical: Coumadin              -pt has been therapeutic or supra therapeutic on Warfarin   3. Pain Management:  T#3 prn for headache, d/ced tramadol, if not better as BP normalizes will trial topamax       4. Mood: team to provide ego support 5. Neuropsych: This patient is not capable of making decisions on his own behalf--question of mild cognitive deficits at baseline.   6. Skin/Wound Care: routine pressure relief measures. Add supplements between meals.   7. Fluids/Electrolytes/Nutrition:  Monitor I/O. Meal intake improved,monitor BMET BMP Latest Ref Rng  12/02/2015 11/25/2015 11/23/2015  Glucose 65 - 99 mg/dL 159(H) 159(H) 131(H)  BUN 6 - 20 mg/dL 55(H) 51(H) 42(H)  Creatinine 0.61 - 1.24 mg/dL 2.80(H) 3.19(H) 3.37(H)  Sodium 135 - 145 mmol/L 136 132(L) 134(L)  Potassium 3.5 - 5.1 mmol/L 4.0 3.9 3.5  Chloride 101 - 111 mmol/L 103 97(L) 99(L)  CO2 22 - 32 mmol/L 23 19(L) 23  Calcium 8.9 - 10.3 mg/dL 8.7(L) 8.9 9.1    8. HTN:  -holding  , hctz, lisinopril,  Will resume low dose HCTZ, to lower BP and help with edema  restarted catapres .6m TID  Increased Cardizem to 300   Hydralazine TID scheduled dose,   increased to 50 mg on 5/7  Filed Vitals:   12/06/15 1500 12/07/15 0539  BP: 167/78 198/96  Pulse: 47 69  Temp: 98.6 F (37 C) 98.3 F (36.8 C)  Resp: 20 19   9. A Fib:  cardizem/warfarin. Continue Cardizem 10 Hyponatremia: mild. Encourage appropriate nutrition 11. CKD: Follow i's and o's, repeat BMET stable creat sl better    12. H/o gout: allopurinol d/c due to increased creat, no flares 13. Gastric ulcers/GIB:  Protonix, monitor any bleeding while on warfarin 14.  Hematuria without dysuria,  UA neg except hematuria, C and S multispecies 15.  Elevated CBG, Hgb A1C 8.5 on 4/30. Improved on  amaryl  7m, increased to 4 mg on 5/6, am CBG 75 will cont  26mCBG (last 3)   Recent Labs  12/06/15 1649 12/06/15 2118 12/07/15 0640  GLUCAP 131* 114* 75  16.  Vascular dementia-will trial aricept although do not expect marked effect,now on remeron as well 17.  LE edema R>L venous insuff, renal disease and effect of meds, likely hydralazine, hesitate to use diuretic due to renal fxn, will elevate , order teds LOS (Days) 15 A FACE TO FACE EVALUATION WAS PERFORMED  Kaytlan Behrman E 12/07/2015, 7:30 AM

## 2015-12-07 NOTE — Progress Notes (Signed)
ANTICOAGULATION CONSULT NOTE - Follow Up Consult  Pharmacy Consult for coumadin Indication: atrial fibrillation  No Known Allergies  Patient Measurements: Height: 5' 9.5" (176.5 cm) Weight: 169 lb 12.1 oz (77 kg) IBW/kg (Calculated) : 71.85 Heparin Dosing Weight:   Vital Signs: Temp: 98.3 F (36.8 C) (05/10 0539) Temp Source: Oral (05/10 0539) BP: 198/96 mmHg (05/10 0539) Pulse Rate: 69 (05/10 0539)  Labs:  Recent Labs  12/05/15 0531 12/06/15 0411 12/07/15 0526  LABPROT 22.9* 25.3* 24.0*  INR 2.03* 2.33* 2.17*    Estimated Creatinine Clearance: 25.7 mL/min (by C-G formula based on Cr of 2.8).   Medications:  Scheduled:  . aspirin EC  81 mg Oral Daily  . cloNIDine  0.3 mg Oral TID  . diclofenac sodium  4 g Topical QID  . diltiazem  300 mg Oral Daily  . donepezil  5 mg Oral QHS  . feeding supplement (NEPRO CARB STEADY)  237 mL Oral TID BM  . glimepiride  2 mg Oral Q breakfast  . hydrALAZINE  50 mg Oral Q8H  . insulin aspart  0-20 Units Subcutaneous TID WC  . insulin aspart  0-5 Units Subcutaneous QHS  . mirtazapine  7.5 mg Oral QHS  . polyethylene glycol  17 g Oral Daily  . senna-docusate  2 tablet Oral BID  . warfarin  2.5 mg Oral Once per day on Mon Thu  . warfarin  5 mg Oral Once per day on Sun Tue Wed Fri Sat  . Warfarin - Pharmacist Dosing Inpatient   Does not apply q1800   Infusions:    Assessment: 70 yo male with afib is currently on therapeutic coumadin.  INR today is 2.17.  Goal of Therapy:  INR 2-3 Monitor platelets by anticoagulation protocol: Yes   Plan:  - continue coumadin 5 mg po daily except 2.5 mg on Mon and Thurs - INR in am  Patrick [name], Tsz-Yin 12/07/2015,8:20 AM

## 2015-12-07 NOTE — Progress Notes (Signed)
Orthopedic Tech Progress Note Patient Details:  [name] Mar 31, 1946 [id number]  Patient ID: Patrick [name], male   DOB: Dec 08, 1945, 70 y.o.   MRN: [id number] Called in advanced brace order; spoke with Evangeline Dakin, Abdias Hickam 12/07/2015, 12:40 PM

## 2015-12-07 NOTE — Progress Notes (Signed)
Speech Language Pathology Daily Session Note  Patient Details  Name: Patrick [name] MRN: [id number] Date of Birth: [birth year]  Today's Date: 12/07/2015 SLP Individual Time: 57-1530 SLP Individual Time Calculation (min): 40 min  Short Term Goals: Week 2: SLP Short Term Goal 1 (Week 2): Pt will recall basic, daily information with min assist verbal cues for use of external aids.  SLP Short Term Goal 2 (Week 2): Pt will attend to basic, familiar tasks for 30-45 minute intervals with supervision verbal cues for redirection.  SLP Short Term Goal 3 (Week 2): Pt will complete basic, familiar tasks with supervision verbal cues for functional problem solving  SLP Short Term Goal 4 (Week 2): Pt will identify at least 2 ways in which his current deficits s/p CVA will impact his functional independence in the home environment with mod assist.    Skilled Therapeutic Interventions: Skilled treatment session focused on addressing cognition goals. Upon SLP arrival patient and wife sitting together discussing current family events.  Patient required Mod question cues to recall details of son's move that were discussed yesterday with multiple times. During discussion regarding current deficits patient and wife became tearful.  Patient reported being aware of all his deficits as well as his self-reported lack of progress.  SLP reviewed therapy notes with patient to highlight his gains in therapy.  Continue with current plan of care.   Function:  Cognition Comprehension Comprehension assist level: Understands basic 90% of the time/cues < 10% of the time  Expression   Expression assist level: Expresses basic 75 - 89% of the time/requires cueing 10 - 24% of the time. Needs helper to occlude trach/needs to repeat words.  Social Interaction Social Interaction assist level: Interacts appropriately 25 - 49% of time - Needs frequent redirection.  Problem Solving Problem solving assist level: Solves basic 50 - 74% of the  time/requires cueing 25 - 49% of the time  Memory Memory assist level: Recognizes or recalls 50 - 74% of the time/requires cueing 25 - 49% of the time    Pain Pain Assessment Pain Assessment: No/denies pain  Therapy/Group: Individual Therapy  Carmelia Roller., [place] [id number]  [place] 12/07/2015, 8:45 PM

## 2015-12-08 ENCOUNTER — Inpatient Hospital Stay (HOSPITAL_COMMUNITY): Payer: Medicare Other | Admitting: Occupational Therapy

## 2015-12-08 ENCOUNTER — Inpatient Hospital Stay (HOSPITAL_COMMUNITY): Payer: Medicare Other

## 2015-12-08 LAB — BASIC METABOLIC PANEL
ANION GAP: 14 (ref 5–15)
BUN: 45 mg/dL — ABNORMAL HIGH (ref 6–20)
CHLORIDE: 104 mmol/L (ref 101–111)
CO2: 19 mmol/L — AB (ref 22–32)
Calcium: 9 mg/dL (ref 8.9–10.3)
Creatinine, Ser: 2.86 mg/dL — ABNORMAL HIGH (ref 0.61–1.24)
GFR calc non Af Amer: 21 mL/min — ABNORMAL LOW (ref >60)
GFR, EST AFRICAN AMERICAN: 24 mL/min — AB (ref >60)
GLUCOSE: 161 mg/dL — AB (ref 65–99)
Potassium: 4.1 mmol/L (ref 3.5–5.1)
Sodium: 137 mmol/L (ref 135–145)

## 2015-12-08 LAB — GLUCOSE, CAPILLARY
GLUCOSE-CAPILLARY: 75 mg/dL (ref 65–99)
GLUCOSE-CAPILLARY: 146 mg/dL — AB (ref 65–99)
Glucose-Capillary: 84 mg/dL (ref 65–99)
Glucose-Capillary: 242 mg/dL — ABNORMAL HIGH (ref 65–99)

## 2015-12-08 LAB — PROTIME-INR
INR: 2.18 — AB (ref 0.00–1.49)
PROTHROMBIN TIME: 24.1 s — AB (ref 11.6–15.2)

## 2015-12-08 MED ORDER — HYDROCHLOROTHIAZIDE 10 MG/ML ORAL SUSPENSION: 6.2500 mg | Freq: Every day | ORAL | Status: DC

## 2015-12-08 MED ORDER — ACETAMINOPHEN 325 MG PO TABS: 650.0000 mg | ORAL_TABLET | Freq: Four times a day (QID) | ORAL | Status: DC | PRN

## 2015-12-08 MED ORDER — WARFARIN SODIUM 2.5 MG PO TABS: ORAL_TABLET | ORAL | Status: DC

## 2015-12-08 MED ORDER — GLIMEPIRIDE 2 MG PO TABS: 2.0000 mg | ORAL_TABLET | Freq: Every day | ORAL | Status: DC

## 2015-12-08 MED ORDER — HYDRALAZINE HCL 50 MG PO TABS: 50.0000 mg | ORAL_TABLET | Freq: Three times a day (TID) | ORAL | Status: DC

## 2015-12-08 MED ORDER — MIRTAZAPINE 7.5 MG PO TABS: 7.5000 mg | ORAL_TABLET | Freq: Every day | ORAL | Status: DC

## 2015-12-08 MED ORDER — DILTIAZEM HCL ER COATED BEADS 300 MG PO CP24: 300.0000 mg | ORAL_CAPSULE | Freq: Every day | ORAL | Status: DC

## 2015-12-08 MED ORDER — DONEPEZIL HCL 5 MG PO TABS: 5.0000 mg | ORAL_TABLET | Freq: Every day | ORAL | Status: DC

## 2015-12-08 MED ORDER — POLYETHYLENE GLYCOL 3350 17 G PO PACK
17.0000 g | PACK | Freq: Every day | ORAL | Status: DC
Start: 2015-12-08 — End: 2016-09-18

## 2015-12-08 MED ORDER — SENNOSIDES-DOCUSATE SODIUM 8.6-50 MG PO TABS: 2.0000 | ORAL_TABLET | Freq: Two times a day (BID) | ORAL | Status: DC

## 2015-12-08 NOTE — Discharge Summary (Signed)
Physician Discharge Summary  Patient ID: Patrick [name] MRN: [id number] DOB/AGE: [birth year] 70 y.o.  Admit date: 11/22/2015 Discharge date: 12/09/2015  Discharge Diagnoses:  Principal Problem:   Stroke due to embolism [facility]) Active Problems:   Uncontrolled hypertension   Chronic kidney disease (CKD), stage III (moderate)   Permanent atrial fibrillation (HCC)   Diastolic CHF, chronic (HCC)   Hypertensive encephalopathy   Diabetes mellitus type 2 in nonobese [facility])   Cerebrovascular accident (CVA) due to bilateral embolism of middle cerebral arteries   Discharged Condition: stable.    Labs:  Basic Metabolic Panel:  Recent Labs Lab 12/02/15 1016 12/08/15 0938  NA 136 137  K 4.0 4.1  CL 103 104  CO2 23 19*  GLUCOSE 159* 161*  BUN 55* 45*  CREATININE 2.80* 2.86*  CALCIUM 8.7* 9.0    CBC: CBC Latest Ref Rng 11/24/2015 11/23/2015 11/22/2015  WBC 4.0 - 10.5 K/uL 8.0 7.2 6.1  Hemoglobin 13.0 - 17.0 g/dL 13.4 14.0 13.9  Hematocrit 39.0 - 52.0 % 41.4 43.1 42.1  Platelets 150 - 400 K/uL 186 170 196    Lab Results  Component Value Date   INR 2.08* 12/09/2015   INR 2.18* 12/08/2015   INR 2.17* 12/07/2015     CBG:  Recent Labs Lab 12/08/15 0701 12/08/15 1125 12/08/15 1655 12/08/15 2129 12/09/15 0613  GLUCAP 84 242* 75 146* 92    Today's Vitals   12/08/15 1431 12/08/15 1917 12/09/15 0002 12/09/15 0620  BP: 154/72 152/70 160/63 163/94  Pulse: 60 85 60 71  Temp: 98.4 F (36.9 C) 98.2 F (36.8 C) 98.3 F (36.8 C) 98.1 F (36.7 C)  TempSrc: Oral Oral Oral Oral  Resp: '18 19 20 18  ' Height:      Weight:    76.93 kg (169 lb 9.6 oz)  SpO2: 100% 100% 100% 100%  PainSc:         Brief HPI:   Patrick [name] is a 70 y.o. male with history of CKD, HTN, A fib, DMT2, prior CVAs with cognitive deficits who was admitted on 11/18/15 with hypertensive emergency, HA, weakness and minor right sided weakness. He was admitted due to elevated BP and concerns of PRES. Patient had  missed multiple BP doses due to wife being in the hospital. Work up showed acute on chronic renal insufficiency as well as INR -1.97. He was started on cardene drip and home medications resumed.  MRI brain negative but on 04/24  he had decline in mobility with inability to stand, RLE stability due to worsening of RLE weakness. Repeat MRI brain showed interval development of scattered nonhemorrhagic infarcts medial aspect of posterior frontal and parietal lobes bilaterally with multiple remote infarcts and  global atrophy. Blood pressures continued to be labile requiring IV hydralazine.   Therapy ongoing and CIR recommended for follow up therapy.     Hospital Course: Patrick [name] was admitted to rehab 11/22/2015 for inpatient therapies to consist of PT, ST and OT at least three hours five days a week. Past admission physiatrist, therapy team and rehab RN have worked together to provide customized collaborative inpatient rehab. His blood pressures continued to be labile and medications were adjusted for tighter control. SBP have improved from 200 range to 160s. Renal status has been monitored with serial checks and creatinine has stabilized to 2.8 range. Low dose HCTZ was added on 05/10 and can be titrated upwards with close monitoring of renal status. Diabetes has been monitored with ac/hs checks and amaryl  titrated  upwards to 4 mg with increase in hypoglycemic episodes. Therefore it was decreased to 2 mg daily and BS are currently ranging from 70- 250. Elevated BS have been managed with use of SSI.  Nephro supplements were added to help with low calorie malnutrition.  He was reported to have an episode of hematuria and urine culture of 04/17 was negative for infection. Repeat CBC showed H/H to be stable and hematuria has resolved. He continues on coumadin with INR goal 2-3. INR  is therapeutic at 2.08 and he discharged on 5 mg all days except 2.5 mg on Mon/Thursdays.  Aricept was added on 05/04 due to help  with cognitive deficits due to vascular dementia.  He continues to be limited by RLE weakness with decrease in weight bearing as well as cognitive deficits with poor carryover. Family is unable to provide care needed and has elected on SNF for progressive therapies. He was discharged to [facility] on 12/09/15   Rehab course: During patient's stay in rehab weekly team conferences were held to monitor patient's progress, set goals and discuss barriers to discharge. At admission, patient required total assist with mobility and max assist with basic self care tasks. He had moderate cognitive deficits with decrease attention affecting high level cognitive process. He has had improvement in activity tolerance, balance, postural control, as well as ability to compensate for deficits. He has had improvement in functional use RUE  and RLE as well as improved awareness. He is able to complete bathing with min assist, UB dressing with supervision and requires moderate assist for LB dressing. He is requires moderate assist for transfers and is able to propel wheelchair 50 feet with supervision and occasional verbal cues for safety.  He continues to require moderate cues for recall and is able to complete basic tasks with min to moderate assist.    Disposition:  Skilled nursing facility  Diet: Heart healthy/diabetic diet.   Special Instructions: 1. Repeat CBC, BMET and PT/INR on 05/16. INR goal 2-3 range.  2. Check BS ac/hs and use SSI per protocol for elevated BS.       Medication List    STOP taking these medications        ACCU-CHEK AVIVA PLUS w/Device Kit     allopurinol 100 MG tablet  Commonly known as:  ZYLOPRIM     diltiazem 120 MG 24 hr capsule  Commonly known as:  DILACOR XR  Replaced by:  diltiazem 300 MG 24 hr capsule     glucose blood test strip  Commonly known as:  TRUETRACK TEST     hydrochlorothiazide 12.5 MG tablet  Commonly known as:  HYDRODIURIL  Replaced by:   hydrochlorothiazide 10 mg/mL Susp     Lancets 30G Misc     lisinopril 20 MG tablet  Commonly known as:  PRINIVIL,ZESTRIL     metFORMIN 500 MG 24 hr tablet  Commonly known as:  GLUCOPHAGE-XR     pravastatin 80 MG tablet  Commonly known as:  PRAVACHOL      TAKE these medications        acetaminophen 325 MG tablet  Commonly known as:  TYLENOL  Take 2 tablets (650 mg total) by mouth every 6 (six) hours as needed for moderate pain or headache.     aspirin 81 MG EC tablet  Take 81 mg by mouth daily.     cloNIDine 0.3 MG tablet  Commonly known as:  CATAPRES  Take 1 tablet (0.3 mg total) by mouth 3 (  three) times daily. Please resume the morning of 11/23/15.     diclofenac sodium 1 % Gel  Commonly known as:  VOLTAREN  Apply 4 g topically 4 (four) times daily.     diltiazem 300 MG 24 hr capsule  Commonly known as:  CARDIZEM CD  Take 1 capsule (300 mg total) by mouth daily.     donepezil 5 MG tablet  Commonly known as:  ARICEPT  Take 1 tablet (5 mg total) by mouth at bedtime.     glimepiride 2 MG tablet  Commonly known as:  AMARYL  Take 1 tablet (2 mg total) by mouth daily with breakfast.     hydrALAZINE 50 MG tablet  Commonly known as:  APRESOLINE  Take 1 tablet (50 mg total) by mouth every 8 (eight) hours.     hydrochlorothiazide 10 mg/mL Susp  Take 0.63 mLs (6.25 mg total) by mouth daily.     mirtazapine 7.5 MG tablet  Commonly known as:  REMERON  Take 1 tablet (7.5 mg total) by mouth at bedtime.     polyethylene glycol packet  Commonly known as:  MIRALAX / GLYCOLAX  Take 17 g by mouth daily.     senna-docusate 8.6-50 MG tablet  Commonly known as:  Senokot-S  Take 2 tablets by mouth 2 (two) times daily.     warfarin 2.5 MG tablet  Commonly known as:  COUMADIN  Use 5 on Mon, Tue, Wed, Fri, Sat, Sun.  Use 2.5 mg on Mon and Thurs.       Follow-up Information    Follow up with Charlett Blake, MD.   Specialty:  Physical Medicine and Rehabilitation   Why:   office to call with follow up appointment   Contact information:   [place] [address] [phone]       Call Xu,Jindong, MD.   Specialty:  Neurology   Why:  for follow up appointment in 4-6 weeks.    Contact information:   [address] [place] [address] [phone]       Follow up with [facility] .   Specialty:  [place] information:   [address] [place] [id number]      Signed: Bary Leriche 12/09/2015, 10:50 AM

## 2015-12-08 NOTE — Progress Notes (Signed)
Occupational Therapy Session Note  Patient Details  Name: Patrick [name] MRN: [id number] Date of Birth: 11-26-1945  Today's Date: 12/08/2015 OT Individual Time: [id number] OT Individual Time Calculation (min): 27 min    Skilled Therapeutic Interventions/Progress Updates:    Pt worked on Brewing technologist for the RLE during session.  Max assist for stand pivot transfer from wheelchair to therapy mat.  Worked in standing on trying to advance the RLE with shoe cover on the RLE in standing.  Still needing mod to max assist to advance.  Decreased ability to maintain knee and hip extension in the RLE if standing on it and attempting to weightbear.  Returned to room at end of session and transferred to bed with max assist stand pivot.  Sit to supine with mod assist for LEs.  Pt left in bed with alarm in place and call button and phone in reach.    Therapy Documentation Precautions:  Precautions Precautions: Fall Precaution Comments: RLE hemiparesis Restrictions Weight Bearing Restrictions: No  Pain: Pain Assessment Pain Assessment: No/denies pain ADL: See Function Navigator for Current Functional Status.   Therapy/Group: Individual Therapy  Talis Iwan OTR/L 12/08/2015, 4:11 PM

## 2015-12-08 NOTE — Progress Notes (Signed)
Occupational Therapy Session Note  Patient Details  Name: Patrick [name] MRN: [id number] Date of Birth: 1945-12-23  Today's Date: 12/08/2015 OT Individual Time: [id number] OT Individual Time Calculation (min): 57 min    Short Term Goals: Week 3:  OT Short Term Goal 1 (Week 3): Continue working on min assist level goals overall.   Skilled Therapeutic Interventions/Progress Updates:    Pt seen for OT treatment with focus on neuromuscular re-education for the RUE and RLE.  Pt propelled his wheelchair to and from the therapy gym with supervision and increased time for 1-2 rest breaks each direction.  Once in the gym he required mod assist for transfer to the therapy mat with use of the RW.  Max assist needed to advance the RLE with each step.  Had pt complete the 9 hole peg test in 34 seconds on the left and 46 seconds on the right.  Incorporated FM coordination task of picking up larger 1"pegs one at a time and placing them in the peg board while having to stand.  Max facilitation at the left knee and hip secondary to weakness in standing.  Two to three rest breaks needed between standing intervals.  Progressed to having pt pick up pegs one at a time and manipulate them in his hand until he had a total of three.  Then he was asked to put them in one at time with the RUE.  Worked on reaching task in sitting with emphasis on trunk flexion to take pegs out of the holes and place in container.  After rolling himself back to the room had pt stay in wheelchair with safety belt in place and son present in order to eat his lunch.  Call button in place beside of him on the bed.    Therapy Documentation Precautions:  Precautions Precautions: Fall Precaution Comments: RLE hemiparesis Restrictions Weight Bearing Restrictions: No  Pain:  No report of pain  ADL: See Function Navigator for Current Functional Status.   Therapy/Group: Individual Therapy  Jalena Vanderlinden OTR/L 12/08/2015, 1:15 PM

## 2015-12-08 NOTE — Progress Notes (Signed)
Subjective/Complaints: No complaints, no issues overnite  ROS- denies any CP/SOB, N/V/D  Objective: Vital Signs: Blood pressure 180/95, pulse 68, temperature 98.8 F (37.1 C), temperature source Oral, resp. rate 17, height 5' 9.5" (1.765 m), weight 74.39 kg (164 lb), SpO2 99 %. No results found. Results for orders placed or performed during the hospital encounter of 11/22/15 (from the past 72 hour(s))  Glucose, capillary     Status: Abnormal   Collection Time: 12/05/15 11:34 AM  Result Value Ref Range   Glucose-Capillary 262 (H) 65 - 99 mg/dL   Comment 1 Notify RN   Glucose, capillary     Status: Abnormal   Collection Time: 12/05/15  4:52 PM  Result Value Ref Range   Glucose-Capillary 48 (L) 65 - 99 mg/dL   Comment 1 Notify RN   Glucose, capillary     Status: None   Collection Time: 12/05/15  5:08 PM  Result Value Ref Range   Glucose-Capillary 76 65 - 99 mg/dL   Comment 1 Notify RN   Glucose, capillary     Status: Abnormal   Collection Time: 12/05/15  8:54 PM  Result Value Ref Range   Glucose-Capillary 191 (H) 65 - 99 mg/dL  Protime-INR     Status: Abnormal   Collection Time: 12/06/15  4:11 AM  Result Value Ref Range   Prothrombin Time 25.3 (H) 11.6 - 15.2 seconds   INR 2.33 (H) 0.00 - 1.49  Glucose, capillary     Status: None   Collection Time: 12/06/15  6:49 AM  Result Value Ref Range   Glucose-Capillary 78 65 - 99 mg/dL  Glucose, capillary     Status: Abnormal   Collection Time: 12/06/15 12:00 PM  Result Value Ref Range   Glucose-Capillary 168 (H) 65 - 99 mg/dL  Glucose, capillary     Status: Abnormal   Collection Time: 12/06/15  4:49 PM  Result Value Ref Range   Glucose-Capillary 131 (H) 65 - 99 mg/dL  Glucose, capillary     Status: Abnormal   Collection Time: 12/06/15  9:18 PM  Result Value Ref Range   Glucose-Capillary 114 (H) 65 - 99 mg/dL  Protime-INR     Status: Abnormal   Collection Time: 12/07/15  5:26 AM  Result Value Ref Range   Prothrombin Time  24.0 (H) 11.6 - 15.2 seconds   INR 2.17 (H) 0.00 - 1.49  Glucose, capillary     Status: None   Collection Time: 12/07/15  6:40 AM  Result Value Ref Range   Glucose-Capillary 75 65 - 99 mg/dL  Glucose, capillary     Status: Abnormal   Collection Time: 12/07/15 11:46 AM  Result Value Ref Range   Glucose-Capillary 137 (H) 65 - 99 mg/dL  Glucose, capillary     Status: Abnormal   Collection Time: 12/07/15  4:46 PM  Result Value Ref Range   Glucose-Capillary 260 (H) 65 - 99 mg/dL  Glucose, capillary     Status: None   Collection Time: 12/07/15  9:00 PM  Result Value Ref Range   Glucose-Capillary 77 65 - 99 mg/dL  Protime-INR     Status: Abnormal   Collection Time: 12/08/15  4:41 AM  Result Value Ref Range   Prothrombin Time 24.1 (H) 11.6 - 15.2 seconds   INR 2.18 (H) 0.00 - 1.49  Glucose, capillary     Status: None   Collection Time: 12/08/15  7:01 AM  Result Value Ref Range   Glucose-Capillary 84 65 - 99 mg/dL  HEENT: Normocephalic, atraumatic. Cardio: RRR and no murmur Resp: CTA B/L and unlabored GI: BS positive and NT, ND Skin:   Intact. Warm and dry Neuro: Alert/Oriented x3  Motor 4+/5 in RUE 2/5 RLE 5/5 LUE/LLE Dysarthric Musc/Skel:  No edema. No tenderness. Gen NAD. Vital signs reviewed. 1+ edema in RLE,\1+ in LLE Calf NT, neg Homan  Assessment/Plan: 1. Functional deficits secondary to bilateral posterior frontal and parietal infarct with R LE> UE paresis which require 3+ hours per day of interdisciplinary therapy in a comprehensive inpatient rehab setting. Physiatrist is providing close team supervision and 24 hour management of active medical problems listed below. Physiatrist and rehab team continue to assess barriers to discharge/monitor patient progress toward functional and medical goals. FIM: Function - Bathing Position: Wheelchair/chair at sink Body parts bathed by patient: Right arm, Left arm, Chest, Abdomen, Front perineal area, Right upper leg, Left  upper leg, Right lower leg, Left lower leg Body parts bathed by helper: Buttocks, Back Bathing not applicable: Back Assist Level:  (min A)  Function- Upper Body Dressing/Undressing What is the patient wearing?: Hospital gown Pull over shirt/dress - Perfomed by patient: Thread/unthread right sleeve, Thread/unthread left sleeve, Put head through opening, Pull shirt over trunk Pull over shirt/dress - Perfomed by helper: Pull shirt over trunk Assist Level: Supervision or verbal cues Set up : To obtain clothing/put away Function - Lower Body Dressing/Undressing What is the patient wearing?: AFO, Shoes, Ted Hose, Pants, Non-skid slipper socks Position: Wheelchair/chair at [employer] - Performed by patient: Thread/unthread left underwear leg, Thread/unthread right underwear leg Underwear - Performed by helper: Pull underwear up/down Pants- Performed by patient: Thread/unthread right pants leg, Thread/unthread left pants leg Pants- Performed by helper: Pull pants up/down Non-skid slipper socks- Performed by patient: Don/doff right sock, Don/doff left sock Non-skid slipper socks- Performed by helper: Don/doff right sock Socks - Performed by patient: Don/doff right sock, Don/doff left sock Shoes - Performed by patient: Fasten left, Don/doff left shoe Shoes - Performed by helper: Don/doff right shoe, Fasten right AFO - Performed by helper: Don/doff right AFO TED Hose - Performed by helper: Don/doff right TED hose, Don/doff left TED hose Assist for footwear: Partial/moderate assist Assist for lower body dressing: Touching or steadying assistance (Pt > 75%)  Function - Toileting Toileting activity did not occur: No continent bowel/bladder event Toileting steps completed by patient: Adjust clothing prior to toileting Toileting steps completed by helper: Adjust clothing prior to toileting, Performs perineal hygiene, Adjust clothing after toileting Toileting Assistive Devices: Grab bar or  rail Assist level: Touching or steadying assistance (Pt.75%)  Function - Air cabin crew transfer assistive device: Elevated toilet seat/BSC over toilet Assist level to toilet: Moderate assist (Pt 50 - 74%/lift or lower) Assist level from toilet: Moderate assist (Pt 50 - 74%/lift or lower) Assist level to bedside commode (at bedside): Maximal assist (Pt 25 - 49%/lift and lower) Assist level from bedside commode (at bedside): Maximal assist (Pt 25 - 49%/lift and lower)  Function - Chair/bed transfer Chair/bed transfer activity did not occur: Refused Chair/bed transfer method: Squat pivot Chair/bed transfer assist level: Moderate assist (Pt 50 - 74%/lift or lower) Chair/bed transfer assistive device: Orthosis Chair/bed transfer details: Verbal cues for technique, Verbal cues for precautions/safety, Verbal cues for sequencing, Manual facilitation for placement, Manual facilitation for weight bearing, Manual facilitation for weight shifting, Tactile cues for placement  Function - Locomotion: Wheelchair Will patient use wheelchair at discharge?: Yes Type: Manual Max wheelchair distance: 50 Assist Level: Supervision or verbal  cues Wheel 50 feet with 2 turns activity did not occur: Safety/medical concerns (lethargy,fatigue) Assist Level: Supervision or verbal cues Wheel 150 feet activity did not occur: Safety/medical concerns (lethargy, fatigue) Turns around,maneuvers to table,bed, and toilet,negotiates 3% grade,maneuvers on rugs and over doorsills: No Function - Locomotion: Ambulation Ambulation activity did not occur: Safety/medical concerns (unsafe due to knee buckling) Assistive device: Walker-rolling, Orthosis Max distance: 15 Assist level: Moderate assist (Pt 50 - 74%) Assist level: Moderate assist (Pt 50 - 74%)  Function - Comprehension Comprehension: Auditory Comprehension assistive device: Hearing aids Comprehension assist level: Understands basic 90% of the time/cues <  10% of the time  Function - Expression Expression: Verbal Expression assist level: Expresses basic 75 - 89% of the time/requires cueing 10 - 24% of the time. Needs helper to occlude trach/needs to repeat words.  Function - Social Interaction Social Interaction assist level: Interacts appropriately 25 - 49% of time - Needs frequent redirection.  Function - Problem Solving Problem solving assist level: Solves basic 50 - 74% of the time/requires cueing 25 - 49% of the time  Function - Memory Memory assist level: Recognizes or recalls 50 - 74% of the time/requires cueing 25 - 49% of the time Patient normally able to recall (first 3 days only): Current season  Medical Problem List and Plan: 1.  Mobilty,cognitive and functional deficits secondary to bi-cerebral embolic CVA   - Cont rehab,  with plan for d/c to SNF 2.  DVT Prophylaxis/Anticoagulation: Pharmaceutical: Coumadin              -pt has been therapeutic or supra therapeutic on Warfarin   3. Pain Management:  T#3 prn for headache, d/ced tramadol, if not better as BP normalizes will trial topamax       4. Mood: team to provide ego support 5. Neuropsych: This patient is not capable of making decisions on his own behalf--question of mild cognitive deficits at baseline.   6. Skin/Wound Care: routine pressure relief measures. Add supplements between meals.   7. Fluids/Electrolytes/Nutrition:  Monitor I/O. Meal intake improved,recheck BMET BMP Latest Ref Rng 12/02/2015 11/25/2015 11/23/2015  Glucose 65 - 99 mg/dL 159(H) 159(H) 131(H)  BUN 6 - 20 mg/dL 55(H) 51(H) 42(H)  Creatinine 0.61 - 1.24 mg/dL 2.80(H) 3.19(H) 3.37(H)  Sodium 135 - 145 mmol/L 136 132(L) 134(L)  Potassium 3.5 - 5.1 mmol/L 4.0 3.9 3.5  Chloride 101 - 111 mmol/L 103 97(L) 99(L)  CO2 22 - 32 mmol/L 23 19(L) 23  Calcium 8.9 - 10.3 mg/dL 8.7(L) 8.9 9.1    8. HTN:  -holding  , hctz, lisinopril,  Cont  HCTZ 6.25mg , to lower BP and help with edema  restarted catapres .3mg   TID  Increased Cardizem to 300   Hydralazine TID scheduled dose,   increased to 50 mg on 5/7  Filed Vitals:   12/08/15 0029 12/08/15 0613  BP: 155/77 180/95  Pulse: 51 68  Temp: 97.8 F (36.6 C) 98.8 F (37.1 C)  Resp: 17    9. A Fib: cardizem/warfarin. Continue Cardizem 10 Hyponatremia: mild. Encourage appropriate nutrition 11. CKD: Follow i's and o's, repeat BMET stable creat sl better    12. H/o gout: allopurinol d/c due to increased creat, no flares 13. Gastric ulcers/GIB:  Protonix, monitor any bleeding while on warfarin 14.  Hematuria resolved 15.  Elevated CBG, Hgb A1C 8.5 on 4/30. Improved on  amaryl  2mg , increased to 4 mg on 5/6, am CBG 75 will cont  2mg  CBG (last 3)  Recent Labs  12/07/15 1646 12/07/15 2100 12/08/15 0701  GLUCAP 260* 77 84  16.  Vascular dementia-will trial aricept although do not expect marked effect,also on remeron 17.  LE edema R>L venous insuff, renal disease and effect of meds, likely hydralazine, hesitate to use diuretic due to renal fxn, will elevate , order teds LOS (Days) 16 A FACE TO FACE EVALUATION WAS PERFORMED  Temesha Queener E 12/08/2015, 7:11 AM

## 2015-12-08 NOTE — Progress Notes (Signed)
Speech Language Pathology Daily Session Note  Patient Details  Name: Patrick [name] MRN: [id number] Date of Birth: 18-Jan-1946  Today's Date: 12/08/2015 SLP Individual Time: 1500-1530 SLP Individual Time Calculation (min): 30 min  Short Term Goals: Week 2: SLP Short Term Goal 1 (Week 2): Pt will recall basic, daily information with min assist verbal cues for use of external aids.  SLP Short Term Goal 2 (Week 2): Pt will attend to basic, familiar tasks for 30-45 minute intervals with supervision verbal cues for redirection.  SLP Short Term Goal 3 (Week 2): Pt will complete basic, familiar tasks with supervision verbal cues for functional problem solving  SLP Short Term Goal 4 (Week 2): Pt will identify at least 2 ways in which his current deficits s/p CVA will impact his functional independence in the home environment with mod assist.    Skilled Therapeutic Interventions:  Pt was seen for skilled ST targeting cognitive goals.  Pt was unable to recall any details from therapy sessions today when questioned by therapist.  With the use of his daily schedule and mod assist question cues, pt was able to recall 1 specific detail from OT therapy session.  SLP facilitated the session with mod-max assist question cues to generate a memory aid listing 2-3 general goals from each therapy discipline.  Pt had difficulty reading longer lists due to baseline limitations/education but was able to read a simplified list with supervision.  List was left in pt's room and current goals in speech therapy were discussed with pt's family, in addition to techniques to facilitate pt's recall of daily information.  Pt left in bed with family at bedside.  Continue per current plan of care.    Function:  Eating Eating                 Cognition Comprehension Comprehension assist level: Understands basic 90% of the time/cues < 10% of the time  Expression   Expression assist level: Expresses basic 75 - 89% of the  time/requires cueing 10 - 24% of the time. Needs helper to occlude trach/needs to repeat words.  Social Interaction Social Interaction assist level: Interacts appropriately 50 - 74% of the time - May be physically or verbally inappropriate.  Problem Solving Problem solving assist level: Solves basic 50 - 74% of the time/requires cueing 25 - 49% of the time  Memory Memory assist level: Recognizes or recalls 50 - 74% of the time/requires cueing 25 - 49% of the time    Pain Pain Assessment Pain Assessment: No/denies pain  Therapy/Group: Individual Therapy  Lee-Ann Gal, Selinda [place] 12/08/2015, 4:51 PM

## 2015-12-08 NOTE — Progress Notes (Addendum)
Physical Therapy Note  Patient Details  Name: Patrick [name] MRN: [id number] Date of Birth: 02/08/1946 Today's Date: 12/08/2015  [id number], 43 min; 1330-1405, 35 min individual No pain reported AM or PM.  Tx 1:  Bil LE edema continues.  Bed mobility rolling R and coming to sit with mod/max assist overall due to poor technique and difficulty moving bil LEs off bed.  Pt sat bedside leaning L or R onto forearm due to low tolerance for sitting EOB.  PT donned knee high TEDS and shoes, pants. Sit>< stand with mod/max assist from bed.  Pt stated he needed to urinate.  Toilet transfer.  Pt had continent BM and voided; diaper wet. Pt washed hands at sink with supervision.  Pt exhausted; rested in w/c for a few minutes. Left resting in w/c with quick release belt donned; all needs within reach.  Tx 2:  Marcello Moores from [facility] here for [facility] assessement.  He agreed that R Reaction AFO controls knee well during gait; he will deliver one tomorrow.  He also recommended a leather toe cap for R shoe.  Family discussed new shoes for pt; dtr in law to buy him new shoes tonight and Marcello Moores to pick up and place shoe cap tomorrow.  Gait with EVA, RAFO. Pt unable to advance RLE but does exhibit some knee extension when wearing RAFO.  Pt left resting in w/c with all needs within reach.  Family present.  Quick release belt donned.  LTGs downgraded today due to slow progress.  [name] 12/08/2015, 9:38 AM

## 2015-12-08 NOTE — Progress Notes (Addendum)
ANTICOAGULATION CONSULT NOTE - Follow Up Consult  Pharmacy Consult for coumadin Indication: atrial fibrillation  No Known Allergies  Patient Measurements: Height: 5' 9.5" (176.5 cm) Weight: 164 lb (74.39 kg) IBW/kg (Calculated) : 71.85 Heparin Dosing Weight:   Vital Signs: Temp: 98.8 F (37.1 C) (05/11 0613) Temp Source: Oral (05/11 0613) BP: 180/95 mmHg (05/11 0613) Pulse Rate: 68 (05/11 0613)  Labs:  Recent Labs  12/06/15 0411 12/07/15 0526 12/08/15 0441  LABPROT 25.3* 24.0* 24.1*  INR 2.33* 2.17* 2.18*    Estimated Creatinine Clearance: 25.7 mL/min (by C-G formula based on Cr of 2.8).   Medications:  Scheduled:  . aspirin EC  81 mg Oral Daily  . cloNIDine  0.3 mg Oral TID  . diclofenac sodium  4 g Topical QID  . diltiazem  300 mg Oral Daily  . donepezil  5 mg Oral QHS  . feeding supplement (NEPRO CARB STEADY)  237 mL Oral TID BM  . glimepiride  2 mg Oral Q breakfast  . hydrALAZINE  50 mg Oral Q8H  . hydrochlorothiazide  6.25 mg Oral Daily  . insulin aspart  0-20 Units Subcutaneous TID WC  . insulin aspart  0-5 Units Subcutaneous QHS  . mirtazapine  7.5 mg Oral QHS  . polyethylene glycol  17 g Oral Daily  . senna-docusate  2 tablet Oral BID  . warfarin  2.5 mg Oral Once per day on Mon Thu  . warfarin  5 mg Oral Once per day on Sun Tue Wed Fri Sat  . Warfarin - Pharmacist Dosing Inpatient   Does not apply q1800   Infusions:    Assessment: 70 yo male with afib is currently on therapeutic coumadin.  INR today is 2.18.  Goal of Therapy:  INR 2-3 Monitor platelets by anticoagulation protocol: Yes   Plan:  - continue coumadin 5 mg po daily except 2.5 mg on Mon and Thurs - If INR good tom, will change to MWF  Donyae Kohn, Tsz-Yin 12/08/2015,8:03 AM

## 2015-12-08 NOTE — NC FL2 (Signed)
[place] MEDICAID FL2 LEVEL OF CARE SCREENING TOOL     IDENTIFICATION  Patient Name: Patrick [name] Birthdate: 07-04-1946 Sex: male Admission Date (Current Location): 11/22/2015  [facility] and [place] Number:  Herbalist and Address:  [facility] [place], [place] [address] [place], [place] [address]      Provider Number: [id number]  Attending Physician Name and Address:  Charlett Blake, MD  Relative Name and Phone Number:       Current Level of Care: Other (Comment) (Inpatient Rehab) Recommended Level of Care: [place] Prior Approval Number:    Date Approved/Denied:   PASRR Number:  [id number]  Discharge Plan: SNF    Current Diagnoses: Patient Active Problem List   Diagnosis Date Noted  . Diabetes mellitus type 2 in nonobese (HCC)   . Cerebrovascular accident (CVA) due to bilateral embolism of middle cerebral arteries   . Stroke due to embolism ([place]) 11/22/2015  . TIA (transient ischemic attack)   . AKI (acute kidney injury) ([place])   . Other vascular headache   . History of CVA with residual deficit   . Supratherapeutic INR   . Hypertensive emergency 11/18/2015  . Diastolic CHF, chronic ([place]) 11/18/2015  . Hypertensive encephalopathy 11/18/2015  . Hypertensive crisis 11/18/2015  . Headache 11/15/2015  . Thigh pain 11/15/2015  . Hematuria 08/30/2015  . Chronic anemia 03/31/2014  . Healthcare maintenance 03/31/2014  . Permanent atrial fibrillation ([place]) 07/17/2013  . Chronic anticoagulation, on coumadin 07/17/2013  . Tachy-brady syndrome ([place]) 07/17/2013  . Non compliance w medication regimen 06/25/2012  . Acute ischemic VBA thalamic stroke ([place]) 12/11/2010  . Chronic kidney disease (CKD), stage III (moderate) 12/21/2008  . CORONARY ARTERY DISEASE, non obstructive disease in 2005 with cath 07/19/2006  . Diabetes mellitus with severe nonproliferative retinopathy and macular edema 06/05/2006  . Hyperlipidemia 06/05/2006  .  Gout 06/05/2006  . DEPRESSION 06/05/2006  . Uncontrolled hypertension 06/05/2006  . Sleep apnea 06/05/2006    Orientation RESPIRATION BLADDER Height & Weight     Self, Time, Situation, Place  Normal Continent Weight: 74.39 kg (164 lb) Height:  5' 9.5" (176.5 cm)  BEHAVIORAL SYMPTOMS/MOOD NEUROLOGICAL BOWEL NUTRITION STATUS      Continent Diet (heart healthy/carb modified)  AMBULATORY STATUS COMMUNICATION OF NEEDS Skin   Extensive Assist Verbally Other (Comment), Normal (normal, except for fistual to intergluteral fold)                       Personal Care Assistance Level of Assistance  Bathing, Dressing, Feeding Bathing Assistance: Limited assistance Feeding assistance: Limited assistance Dressing Assistance: Limited assistance     Functional Limitations Info             SPECIAL CARE FACTORS FREQUENCY  Blood pressure, PT (By licensed PT), OT (By licensed OT), Speech therapy Blood Pressure Frequency: per protocol   PT Frequency: 5x/week OT Frequency: 5x/week     Speech Therapy Frequency: 5x/week      Contractures Contractures Info: Not present    Additional Factors Info  Code Status (partial) Code Status Info: DNR, but full medical care.  Pressors okay.             Current Medications (12/08/2015):  This is the current hospital active medication list Current Facility-Administered Medications  Medication Dose Route Frequency Provider Last Rate Last Dose  . acetaminophen (TYLENOL) tablet 650 mg  650 mg Oral Q6H PRN Bary Leriche, PA-C      .  alum & mag hydroxide-simeth (MAALOX/MYLANTA) 200-200-20 MG/5ML suspension 30 mL  30 mL Oral Q4H PRN Bary Leriche, PA-C      . aspirin EC tablet 81 mg  81 mg Oral Daily Bary Leriche, PA-C   81 mg at 12/08/15 0946  . bisacodyl (DULCOLAX) EC tablet 5 mg  5 mg Oral Daily PRN Bary Leriche, PA-C   5 mg at 11/28/15 1402  . cloNIDine (CATAPRES) tablet 0.3 mg  0.3 mg Oral TID Charlett Blake, MD   0.3 mg at 12/08/15 0945   . diclofenac sodium (VOLTAREN) 1 % transdermal gel 4 g  4 g Topical QID [name] Love, PA-C   4 g at 12/08/15 0945  . diltiazem (CARDIZEM CD) 24 hr capsule 300 mg  300 mg Oral Daily Charlett Blake, MD   300 mg at 12/08/15 0944  . diphenhydrAMINE (BENADRYL) 12.5 MG/5ML elixir 12.5-25 mg  12.5-25 mg Oral Q6H PRN [name] Love, PA-C      . donepezil (ARICEPT) tablet 5 mg  5 mg Oral QHS Charlett Blake, MD   5 mg at 12/07/15 2215  . feeding supplement (NEPRO CARB STEADY) liquid 237 mL  237 mL Oral TID BM Charlett Blake, MD   237 mL at 12/08/15 0951  . glimepiride (AMARYL) tablet 2 mg  2 mg Oral Q breakfast Charlett Blake, MD   2 mg at 12/08/15 0944  . guaiFENesin-dextromethorphan (ROBITUSSIN DM) 100-10 MG/5ML syrup 5-10 mL  5-10 mL Oral Q6H PRN [name] Love, PA-C      . hydrALAZINE (APRESOLINE) tablet 50 mg  50 mg Oral Q8H [name], MD   50 mg at 12/08/15 [id number]  . hydrochlorothiazide 10 mg/mL oral suspension 6.25 mg  6.25 mg Oral Daily Charlett Blake, MD   6.25 mg at 12/08/15 0943  . insulin aspart (novoLOG) injection 0-20 Units  0-20 Units Subcutaneous TID WC Bary Leriche, PA-C   11 Units at 12/07/15 1729  . insulin aspart (novoLOG) injection 0-5 Units  0-5 Units Subcutaneous QHS Bary Leriche, PA-C   2 Units at 12/01/15 2128  . mirtazapine (REMERON) tablet 7.5 mg  7.5 mg Oral QHS Charlett Blake, MD   7.5 mg at 12/07/15 2215  . polyethylene glycol (MIRALAX / GLYCOLAX) packet 17 g  17 g Oral Daily Bary Leriche, PA-C   17 g at 12/08/15 [id number]  . prochlorperazine (COMPAZINE) tablet 5-10 mg  5-10 mg Oral Q6H PRN Bary Leriche, PA-C       Or  . prochlorperazine (COMPAZINE) injection 5-10 mg  5-10 mg Intramuscular Q6H PRN Bary Leriche, PA-C       Or  . prochlorperazine (COMPAZINE) suppository 12.5 mg  12.5 mg Rectal Q6H PRN Bary Leriche, PA-C      . senna-docusate (Senokot-S) tablet 2 tablet  2 tablet Oral BID Charlett Blake, MD   2 tablet at 12/08/15 0944  . warfarin  (COUMADIN) tablet 2.5 mg  2.5 mg Oral Once per day on Mon Thu Jaquita Folds, RPH   2.5 mg at 12/05/15 1844  . warfarin (COUMADIN) tablet 5 mg  5 mg Oral Once per day on Sun Tue Wed Fri Sat Jaquita Folds, RPH   5 mg at 12/07/15 1730  . Warfarin - Pharmacist Dosing Inpatient   Does not apply [place], PA-C         Discharge Medications: Please see discharge summary for  a list of discharge medications.  Relevant Imaging Results:  Relevant Lab Results:   Additional Information SSN [id number] [place], [place]

## 2015-12-09 ENCOUNTER — Inpatient Hospital Stay (HOSPITAL_COMMUNITY): Payer: Medicare Other | Admitting: Speech Pathology

## 2015-12-09 ENCOUNTER — Inpatient Hospital Stay (HOSPITAL_COMMUNITY): Payer: Medicare Other | Admitting: Physical Therapy

## 2015-12-09 ENCOUNTER — Inpatient Hospital Stay (HOSPITAL_COMMUNITY): Payer: Medicare Other | Admitting: Occupational Therapy

## 2015-12-09 DIAGNOSIS — I6789 Other cerebrovascular disease: Secondary | ICD-10-CM | POA: Diagnosis not present

## 2015-12-09 DIAGNOSIS — I69351 Hemiplegia and hemiparesis following cerebral infarction affecting right dominant side: Secondary | ICD-10-CM | POA: Diagnosis not present

## 2015-12-09 DIAGNOSIS — N183 Chronic kidney disease, stage 3 (moderate): Secondary | ICD-10-CM | POA: Diagnosis not present

## 2015-12-09 DIAGNOSIS — I69393 Ataxia following cerebral infarction: Secondary | ICD-10-CM | POA: Diagnosis not present

## 2015-12-09 DIAGNOSIS — I674 Hypertensive encephalopathy: Secondary | ICD-10-CM | POA: Diagnosis not present

## 2015-12-09 DIAGNOSIS — I639 Cerebral infarction, unspecified: Secondary | ICD-10-CM | POA: Diagnosis not present

## 2015-12-09 DIAGNOSIS — E46 Unspecified protein-calorie malnutrition: Secondary | ICD-10-CM | POA: Diagnosis not present

## 2015-12-09 DIAGNOSIS — I63413 Cerebral infarction due to embolism of bilateral middle cerebral arteries: Secondary | ICD-10-CM | POA: Diagnosis not present

## 2015-12-09 DIAGNOSIS — R278 Other lack of coordination: Secondary | ICD-10-CM | POA: Diagnosis not present

## 2015-12-09 DIAGNOSIS — I10 Essential (primary) hypertension: Secondary | ICD-10-CM | POA: Diagnosis not present

## 2015-12-09 DIAGNOSIS — I5032 Chronic diastolic (congestive) heart failure: Secondary | ICD-10-CM | POA: Diagnosis not present

## 2015-12-09 DIAGNOSIS — R41841 Cognitive communication deficit: Secondary | ICD-10-CM | POA: Diagnosis not present

## 2015-12-09 DIAGNOSIS — I69322 Dysarthria following cerebral infarction: Secondary | ICD-10-CM | POA: Diagnosis not present

## 2015-12-09 DIAGNOSIS — R262 Difficulty in walking, not elsewhere classified: Secondary | ICD-10-CM | POA: Diagnosis not present

## 2015-12-09 DIAGNOSIS — I482 Chronic atrial fibrillation: Secondary | ICD-10-CM | POA: Diagnosis not present

## 2015-12-09 DIAGNOSIS — I63139 Cerebral infarction due to embolism of unspecified carotid artery: Secondary | ICD-10-CM | POA: Diagnosis not present

## 2015-12-09 DIAGNOSIS — G9349 Other encephalopathy: Secondary | ICD-10-CM | POA: Diagnosis not present

## 2015-12-09 DIAGNOSIS — R600 Localized edema: Secondary | ICD-10-CM | POA: Diagnosis not present

## 2015-12-09 DIAGNOSIS — E119 Type 2 diabetes mellitus without complications: Secondary | ICD-10-CM | POA: Diagnosis not present

## 2015-12-09 DIAGNOSIS — R269 Unspecified abnormalities of gait and mobility: Secondary | ICD-10-CM | POA: Diagnosis not present

## 2015-12-09 DIAGNOSIS — M6281 Muscle weakness (generalized): Secondary | ICD-10-CM | POA: Diagnosis not present

## 2015-12-09 DIAGNOSIS — I69319 Unspecified symptoms and signs involving cognitive functions following cerebral infarction: Secondary | ICD-10-CM | POA: Diagnosis not present

## 2015-12-09 LAB — GLUCOSE, CAPILLARY
GLUCOSE-CAPILLARY: 109 mg/dL — AB (ref 65–99)
Glucose-Capillary: 92 mg/dL (ref 65–99)
Glucose-Capillary: 161 mg/dL — ABNORMAL HIGH (ref 65–99)

## 2015-12-09 LAB — PROTIME-INR
INR: 2.08 — ABNORMAL HIGH (ref 0.00–1.49)
Prothrombin Time: 23.2 seconds — ABNORMAL HIGH (ref 11.6–15.2)

## 2015-12-09 MED ORDER — WARFARIN SODIUM 2.5 MG PO TABS: ORAL_TABLET | ORAL | Status: DC

## 2015-12-09 NOTE — Progress Notes (Signed)
Occupational Therapy Discharge Summary  Patient Details  Name: Patrick [name] MRN: [id number] Date of Birth: 08-28-1945  Patient has met 7 of 12 long term goals due to improved activity tolerance, improved balance, postural control, ability to compensate for deficits, functional use of  RIGHT upper extremity, improved attention, improved awareness and improved coordination.  Patient to discharge at [facility] Assist level.  Patient's care partner unavailable to provide the necessary physical and cognitive assistance at discharge.    Reasons goals not met: Pt has mod I goals for grooming and UB dressing, but he needs set up.  Pt had min A goals with standing balance, toilet transfer, and shower/tub transfer, but needs mod A.  Recommendation:  Patient will benefit from ongoing skilled OT services in skilled nursing facility setting to continue to advance functional skills in the area of BADL.  Equipment: No equipment provided  Reasons for discharge: treatment goals met  Patient/family agrees with progress made and goals achieved: Yes  OT Discharge Precautions/Restrictions  Precautions Precautions: Fall Precaution Comments: RLE hemiparesis Restrictions Weight Bearing Restrictions: No   Vital Signs Therapy Vitals Temp: 98.3 F (36.8 C) Temp Source: Oral Pulse Rate: 76 Resp: 18 BP: (!) 163/90 mmHg Patient Position (if appropriate): Sitting Oxygen Therapy SpO2: 100 % O2 Device: Not Delivered Pain Pain Assessment Pain Assessment: No/denies pain ADL ADL ADL Comments: min A bathing and LB dressing, mod A transfers Vision/Perception  Vision- History Baseline Vision/History: Wears glasses Wears Glasses: At all times Patient Visual Report: No change from baseline Vision- Assessment Vision Assessment?: No apparent visual deficits Perception Comments: WFL  Cognition Overall Cognitive Status: History of cognitive impairments - at baseline Arousal/Alertness:  Lethargic Orientation Level: Oriented X4 Attention: Focused;Sustained Focused Attention: Appears intact Sustained Attention: Appears intact Memory Impairment: Decreased short term memory Awareness Impairment: Anticipatory impairment Problem Solving: Impaired Safety/Judgment: Appears intact Sensation Sensation Light Touch: Appears Intact Stereognosis: Appears Intact Hot/Cold: Appears Intact Proprioception: Appears Intact Coordination Gross Motor Movements are Fluid and Coordinated: No Fine Motor Movements are Fluid and Coordinated: Yes Coordination and Movement Description: Pt now can use R hand functionally in all tasks. he is able to open containers and tie his shoes. Heel Shin Test: minimal excursion LLE; unable RLE Motor  Motor Motor: Hemiplegia;Paraplegia Motor - Discharge Observations: Pt with BLE weakness with right being more impaired than left. Mobility  Bed Mobility Bed Mobility: Supine to Sit;Sit to Supine Supine to Sit: 5: Supervision;HOB flat Supine to Sit Details: Verbal cues for technique Sit to Supine: 5: Supervision;HOB flat Sit to Supine - Details: Verbal cues for technique Transfers Sit to Stand: 4: Min guard;4: Min assist Sit to Stand Details: Verbal cues for sequencing;Verbal cues for technique;Verbal cues for precautions/safety Stand to Sit: 4: Min assist;With upper extremity assist Stand to Sit Details (indicate cue type and reason): Verbal cues for technique;Verbal cues for precautions/safety  Trunk/Postural Assessment  Cervical Assessment Cervical Assessment: Within Functional Limits Thoracic Assessment Thoracic Assessment: Within Functional Limits Lumbar Assessment Lumbar Assessment: Within Functional Limits Postural Control Postural Limitations: posterior pelvic tilt   Balance Static Sitting Balance Static Sitting - Level of Assistance: 6: Modified independent (Device/Increase time) Dynamic Sitting Balance Dynamic Sitting - Level of  Assistance: 6: Modified independent (Device/Increase time) Static Standing Balance Static Standing - Level of Assistance: 4: Min assist Dynamic Standing Balance Dynamic Standing - Level of Assistance: 3: Mod assist Extremity/Trunk Assessment RUE Assessment RUE Assessment: Within Functional Limits LUE Assessment LUE Assessment: Within Functional Limits   See Function Navigator for  Current Functional Status.  [place] 12/09/2015, 12:51 PM

## 2015-12-09 NOTE — Progress Notes (Signed)
Physical Therapy Discharge Summary  Patient Details  Name: Patrick [name] MRN: [id number] Date of Birth: June 11, 1946  Today's Date: 12/09/2015 PT Individual Time: 0830-0945 PT Individual Time Calculation (min): 75 min    Patient has met 10 of 10 long term goals due to improved activity tolerance, improved balance, improved postural control, increased strength, ability to compensate for deficits, functional use of  right upper extremity and right lower extremity, improved attention, improved awareness and improved coordination.  Patient to discharge at an ambulatory level Mod Assist. Patient to d/c to SNF for continued rehab as family unable to provide physical assist that is required at this time for safe and functional mobility.  Reasons goals not met: All goals met  Recommendation:  Patient will benefit from ongoing skilled PT services in skilled nursing facility setting to continue to advance safe functional mobility, address ongoing impairments in strength, coordination, balance, activity tolerance, and minimize fall risk.  Equipment: No equipment provided  Reasons for discharge: treatment goals met and discharge from hospital  Patient/family agrees with progress made and goals achieved: Yes  PT Discharge Precautions/Restrictions Precautions Precautions: Fall Precaution Comments: RLE hemiparesis Restrictions Weight Bearing Restrictions: No Pain Pain Assessment Pain Assessment: No/denies pain Vision/Perception  Perception Comments: WFL  Cognition Overall Cognitive Status: History of cognitive impairments - at baseline Arousal/Alertness: Lethargic Orientation Level: Oriented X4 Attention: Focused;Sustained Focused Attention: Appears intact Sustained Attention: Appears intact Problem Solving: Impaired Safety/Judgment: Appears intact Sensation Sensation Light Touch: Appears Intact Stereognosis: Not tested Hot/Cold: Not tested Coordination Gross Motor Movements are Fluid  and Coordinated: No Heel Shin Test: minimal excursion LLE; unable RLE Motor  Motor Motor: Hemiplegia;Paraplegia Motor - Discharge Observations: Pt with BLE weakness with right being more impaired than left.  Mobility Bed Mobility Bed Mobility: Supine to Sit;Sit to Supine Supine to Sit: 5: Supervision;HOB flat Supine to Sit Details: Verbal cues for technique Sit to Supine: 5: Supervision;HOB flat Sit to Supine - Details: Verbal cues for technique Transfers Transfers: Yes Sit to Stand: 4: Min guard;4: Min assist Sit to Stand Details: Verbal cues for sequencing;Verbal cues for technique;Verbal cues for precautions/safety Stand to Sit: 4: Min assist;With upper extremity assist Stand to Sit Details (indicate cue type and reason): Verbal cues for technique;Verbal cues for precautions/safety Stand Pivot Transfers: 4: Min assist Locomotion  Ambulation Ambulation: Yes Ambulation/Gait Assistance: 4: Min assist Ambulation Distance (Feet): 33 Feet Assistive device: Rolling walker;Other (Comment) (RLE AFO) Ambulation/Gait Assistance Details: Tactile cues for placement;Tactile cues for weight beaing;Tactile cues for posture;Verbal cues for sequencing;Verbal cues for technique;Verbal cues for gait pattern;Verbal cues for safe use of DME/AE;Manual facilitation for placement Ambulation/Gait Assistance Details: assist for RLE progression, placement Gait Gait: Yes Gait Pattern: Impaired Gait Pattern: Narrow base of support;Decreased weight shift to right;Decreased stance time - right;Poor foot clearance - right Gait velocity: significantly decreased for age/gender norms Stairs / Additional Locomotion Stairs: Yes Stairs Assistance: 4: Min assist Stairs Assistance Details: Verbal cues for sequencing;Verbal cues for technique;Tactile cues for weight shifting;Tactile cues for posture;Tactile cues for placement Stairs Assistance Details (indicate cue type and reason): assist for RLE management,  placement Stair Management Technique: Two rails;Step to pattern;Forwards Number of Stairs: 8 Height of Stairs: 3 Architect: Yes Wheelchair Assistance: 5: Investment banker, operational Details: Verbal cues for Marketing executive: Both upper extremities Wheelchair Parts Management: Needs assistance Distance: 150  Trunk/Postural Assessment  Cervical Assessment Cervical Assessment: Within Functional Limits Thoracic Assessment Thoracic Assessment: Within Functional Limits Lumbar Assessment Lumbar Assessment: Within Functional Limits  Postural Control Postural Limitations: posterior pelvic tilt   Balance Static Sitting Balance Static Sitting - Level of Assistance: 6: Modified independent (Device/Increase time) Dynamic Sitting Balance Dynamic Sitting - Level of Assistance: 6: Modified independent (Device/Increase time) Static Standing Balance Static Standing - Level of Assistance: 4: Min assist Dynamic Standing Balance Dynamic Standing - Level of Assistance: 3: Mod assist Extremity Assessment  RUE Assessment RUE Assessment: Within Functional Limits LUE Assessment LUE Assessment: Within Functional Limits RLE Assessment RLE Assessment: Exceptions to [facility] RLE Strength RLE Overall Strength Comments: 2/5 hip flexor, 2/5 knee extension/flexion, 0/5 ankle ROM LLE Assessment LLE Assessment: Exceptions to [facility] LLE Strength LLE Overall Strength Comments: grossly in sitting: 4/5 hip flex; 4+/5 adduction/abduction, 4/5 knee ext and ankle DF  Skilled Therapeutic Intervention: Pt received seated in bathroom with handoff from NT. Seated at sink pt perform hygiene tasks with S. Stand pivot to return to w/c with min/modA. Assessed all mobility as described above with min/modA overall due to RLE strength/coordination impairments, delay initiation, and aerobic deconditioning. Required several rest breaks throughout session due to fatigue. Shoes handed off  to orthotist for fitting of toe cap with anticipation of delivery of shoes and AFO prior to pt d/c to SNF. Remained seated in w/c at completion of session with son present, all needs within reach.    See Function Navigator for Current Functional Status.  Patrick [name] 12/09/2015, 3:28 PM

## 2015-12-09 NOTE — Progress Notes (Signed)
Social Work Patient ID: [name], male   DOB: [birth year], 70 y.o.   MRN: [id number]   Lynnda Child, LCSW Social Worker Signed  Patient Care Conference 12/09/2015 12:35 AM    Expand All Collapse All   Inpatient RehabilitationTeam Conference and Plan of Care Update Date: 12/07/2015   Time: 10:30 AM     Patient Name: Patrick [name]       Medical Record Number: [id number]  Date of Birth: 12-11-1945 Sex: Male         Room/Bed: 4M06C/4M06C-01 Payor Info: Payor: MEDICARE / Plan: MEDICARE PART A AND B / Product Type: *No Product type* /    Admitting Diagnosis: B CVA   Admit Date/Time:  11/22/2015  4:52 PM Admission Comments: No comment available   Primary Diagnosis:  Stroke due to embolism [facility]) Principal Problem: Stroke due to embolism [facility])    Patient Active Problem List     Diagnosis  Date Noted   .  Diabetes mellitus type 2 in nonobese (HCC)     .  Cerebrovascular accident (CVA) due to bilateral embolism of middle cerebral arteries     .  Stroke due to embolism ([place])  11/22/2015   .  TIA (transient ischemic attack)     .  AKI (acute kidney injury) ([place])     .  Other vascular headache     .  History of CVA with residual deficit     .  Supratherapeutic INR     .  Hypertensive emergency  11/18/2015   .  Diastolic CHF, chronic ([place])  11/18/2015   .  Hypertensive encephalopathy  11/18/2015   .  Hypertensive crisis  11/18/2015   .  Headache  11/15/2015   .  Thigh pain  11/15/2015   .  Hematuria  08/30/2015   .  Chronic anemia  03/31/2014   .  Healthcare maintenance  03/31/2014   .  Permanent atrial fibrillation ([place])  07/17/2013   .  Chronic anticoagulation, on coumadin  07/17/2013   .  Tachy-brady syndrome ([place])  07/17/2013   .  Non compliance w medication regimen  06/25/2012   .  Acute ischemic VBA thalamic stroke ([place])  12/11/2010   .  Chronic kidney disease (CKD), stage III (moderate)  12/21/2008   .  CORONARY ARTERY DISEASE, non obstructive disease in 2005 with cath   07/19/2006   .  Diabetes mellitus with severe nonproliferative retinopathy and macular edema  06/05/2006   .  Hyperlipidemia  06/05/2006   .  Gout  06/05/2006   .  DEPRESSION  06/05/2006   .  Uncontrolled hypertension  06/05/2006   .  Sleep apnea  06/05/2006     Expected Discharge Date: Expected Discharge Date:  (SNF)  Team Members Present: Physician leading conference: Dr. Alysia Penna Social Worker Present: Alfonse Alpers, LCSW Nurse Present: Dorien Chihuahua, RN PT Present: Jorge Mandril, Renaye Rakers, PT OT Present: Clyda Greener, OT SLP Present: Windell Moulding, SLP PPS Coordinator present : Daiva Nakayama, RN, CRRN        Current Status/Progress  Goal  Weekly Team Focus   Medical     poor awareness of defiicts ,Right leg buckles, Peripheral edema, BP improving  Improve therapy partricipatuion and mood  Orthotic management   Bowel/Bladder     Continent of bowel and bladder. LBM 12/04/15   Pt to remain continent of bowel and bladder   Monitor    Swallow/Nutrition/ Hydration  ADL's     supervision for UB selfcare, min to mod assist for LB selfcare,  mod assist for transfers.  Still with limited endurance but slight improvements noted  min assist to supervision level  selfcare re-training, transfer training, enduracne building, balance re-training, neuromuscular re-education   Mobility     mod assist squat pivot , mod/max assist stand pivot transfer with RW and gait x 15', supervision w/c mobility, very fatigued today, with bil  LE edema today  Min A overall; downgraded goals and D/C home ambulation and w/c goals due to pt now SNF placement  nmr, balance and gait training, activity tolerance, RAFO   Communication               Safety/Cognition/ Behavioral Observations    mild-moderate cognitive deficits, poor awareness of deficits and recall of information   min assist for basic   participation, recall of daily information, awareness of impairments    Pain      Tylenol #3 for headaches q 4hrs  <3  Note decrease in request for pain     Skin     Fistual to intergluteal fold improving   No additional skin breakdown  Encourage pt to turn q 2hrs    Rehab Goals Patient on target to meet rehab goals: Yes Rehab Goals Revised: Some of pt's goals have been downgraded due to his slow progress. *See Care Plan and progress notes for long and short-term goals.    Barriers to Discharge:  Wife needs assist Sons work at night      Possible Resolutions to Barriers:   ?SNF vs home     Discharge Planning/Teaching Needs:   Pt's family has decided that pt needs to go to SNF due to level of care he needs and the fact that pt's wife is recovering from her recent stroke.  None, as plans as changed to SNF.    Team Discussion:    Pt's BP is finally going down.  He does have some edema and Dr. Letta Pate wants him to have TED hose.  He is also adding and changing some medications.  Pt is medically stable for transfer to SNF.  OT stated that pt is making progress with bathing and dressing and is at min assist with sit to stand at mod assist.  Pt fatigues easily with PT, but pt is walking 15' with RW and mod assist.  PT wants to get pt an AFO before he transfers to SNF.  Pt has limited participation with speech therapist and has poor awareness of his deficits with poor recall.   Revisions to Treatment Plan:    none    Continued Need for Acute Rehabilitation Level of Care: The patient requires daily medical management by a physician with specialized training in physical medicine and rehabilitation for the following conditions: Daily direction of a multidisciplinary physical rehabilitation program to ensure safe treatment while eliciting the highest outcome that is of practical value to the patient.: Yes Daily medical management of patient stability for increased activity during participation in an intensive rehabilitation regime.: Yes Daily analysis of laboratory values and/or  radiology reports with any subsequent need for medication adjustment of medical intervention for : Blood pressure problems;Neurological problems  Kathryne Ramella, Silvestre Mesi 12/09/2015, 12:35 AM                 f

## 2015-12-09 NOTE — Progress Notes (Signed)
Occupational Therapy Session Note  Patient Details  Name: Kaveon Blatz MRN: [id number] Date of Birth: Dec 09, 1945  Today's Date: 12/09/2015 OT Individual Time: 1100-1200 OT Individual Time Calculation (min): 60 min    Short Term Goals: Week 1:  OT Short Term Goal 1 (Week 1): Pt will complete UB bathing with supervison sitting unsupported EOM or EOB.   OT Short Term Goal 1 - Progress (Week 1): Not met OT Short Term Goal 2 (Week 1): Pt will perform LB bathing sit to stand with mod assist 2 consecutive sessions. OT Short Term Goal 2 - Progress (Week 1): Not met OT Short Term Goal 3 (Week 1): Pt will donn pullover shirt with supervison in unsupported sitting. OT Short Term Goal 3 - Progress (Week 1): Not met OT Short Term Goal 4 (Week 1): Pt will complete stand pivot transfers with LRAD and mod assist to the 3:1 or shower bench.  OT Short Term Goal 4 - Progress (Week 1): Not met OT Short Term Goal 5 (Week 1): Pt will tolerate 30 mins B/D session with no more than 2 rest breaks.  OT Short Term Goal 5 - Progress (Week 1): Not met Week 2:  OT Short Term Goal 1 (Week 2): Pt will complete UB bathing with supervison sitting unsupported EOM or EOB.   OT Short Term Goal 1 - Progress (Week 2): Met OT Short Term Goal 2 (Week 2): Pt will perform LB bathing sit to stand with mod assist 2 consecutive sessions. OT Short Term Goal 2 - Progress (Week 2): Met OT Short Term Goal 3 (Week 2): Pt will donn pullover shirt with supervison in unsupported sitting. OT Short Term Goal 3 - Progress (Week 2): Met OT Short Term Goal 4 (Week 2): Pt will complete stand pivot transfers with LRAD and mod assist to the 3:1 or shower bench.  OT Short Term Goal 4 - Progress (Week 2): Met OT Short Term Goal 5 (Week 2): Pt will tolerate 30 mins B/D session with no more than 2 rest breaks.  OT Short Term Goal 5 - Progress (Week 2): Met Week 3:  OT Short Term Goal 1 (Week 3): Continue working on min assist level goals overall.    Skilled Therapeutic Interventions/Progress Updates:    Pt seen for skilled OT to facilitate ADL skills and safe mobility. Pt agreeable to shower.  Pt needed to doff R AFO before shower. He stood with RW but was not safe to take steps to bathroom without AFO.  He practiced stand pivot transfers to toilet and to shower from w/c. In shower stood with bars to wash bottom. Good recall of adaptive techniques with B/D to manage R side. Pt very fatigued and needed several rest breaks.  Pt resting in recliner in room at end of session with all needs met.  Therapy Documentation Precautions:  Precautions Precautions: Fall Precaution Comments: RLE hemiparesis Restrictions Weight Bearing Restrictions: No    Vital Signs: Therapy Vitals Temp: 98.3 F (36.8 C) Temp Source: Oral Pulse Rate: 76 Resp: 18 BP: (!) 163/90 mmHg Patient Position (if appropriate): Sitting Oxygen Therapy SpO2: 100 % O2 Device: Not Delivered Pain: Pain Assessment Pain Assessment: No/denies pain   ADL:  See Function Navigator for Current Functional Status.   Therapy/Group: Individual Therapy  [place] 12/09/2015, 12:39 PM

## 2015-12-09 NOTE — Patient Care Conference (Signed)
Inpatient RehabilitationTeam Conference and Plan of Care Update Date: 12/07/2015   Time: 10:30 AM    Patient Name: Patrick [name]      Medical Record Number: [id number]  Date of Birth: [birth year] Sex: Male         Room/Bed: 4M06C/4M06C-01 Payor Info: Payor: MEDICARE / Plan: MEDICARE PART A AND B / Product Type: *No Product type* /    Admitting Diagnosis: B CVA  Admit Date/Time:  11/22/2015  4:52 PM Admission Comments: No comment available   Primary Diagnosis:  Stroke due to embolism [facility]) Principal Problem: Stroke due to embolism [facility])  Patient Active Problem List   Diagnosis Date Noted  . Diabetes mellitus type 2 in nonobese (HCC)   . Cerebrovascular accident (CVA) due to bilateral embolism of middle cerebral arteries   . Stroke due to embolism ([place]) 11/22/2015  . TIA (transient ischemic attack)   . AKI (acute kidney injury) ([place])   . Other vascular headache   . History of CVA with residual deficit   . Supratherapeutic INR   . Hypertensive emergency 11/18/2015  . Diastolic CHF, chronic ([place]) 11/18/2015  . Hypertensive encephalopathy 11/18/2015  . Hypertensive crisis 11/18/2015  . Headache 11/15/2015  . Thigh pain 11/15/2015  . Hematuria 08/30/2015  . Chronic anemia 03/31/2014  . Healthcare maintenance 03/31/2014  . Permanent atrial fibrillation ([place]) 07/17/2013  . Chronic anticoagulation, on coumadin 07/17/2013  . Tachy-brady syndrome ([employer]) 07/17/2013  . Non compliance w medication regimen 06/25/2012  . Acute ischemic VBA thalamic stroke ([place]) 12/11/2010  . Chronic kidney disease (CKD), stage III (moderate) 12/21/2008  . CORONARY ARTERY DISEASE, non obstructive disease in 2005 with cath 07/19/2006  . Diabetes mellitus with severe nonproliferative retinopathy and macular edema 06/05/2006  . Hyperlipidemia 06/05/2006  . Gout 06/05/2006  . DEPRESSION 06/05/2006  . Uncontrolled hypertension 06/05/2006  . Sleep apnea 06/05/2006    Expected Discharge Date: Expected Discharge  Date:  (SNF)  Team Members Present: Physician leading conference: Dr. Alysia Penna Social Worker Present: Alfonse Alpers, LCSW Nurse Present: Dorien Chihuahua, RN PT Present: Jorge Mandril, Renaye Rakers, PT OT Present: Clyda Greener, OT SLP Present: Windell Moulding, SLP PPS Coordinator present : Daiva Nakayama, RN, CRRN     Current Status/Progress Goal Weekly Team Focus  Medical   poor awareness of defiicts ,Right leg buckles, Peripheral edema, BP improving  Improve therapy partricipatuion and mood  Orthotic management   Bowel/Bladder   Continent of bowel and bladder. LBM 12/04/15  Pt to remain continent of bowel and bladder  Monitor    Swallow/Nutrition/ Hydration             ADL's   supervision for UB selfcare, min to mod assist for LB selfcare,  mod assist for transfers.  Still with limited endurance but slight improvements noted  min assist to supervision level  selfcare re-training, transfer training, enduracne building, balance re-training, neuromuscular re-education   Mobility   mod assist squat pivot , mod/max assist stand pivot transfer with RW and gait x 15', supervision w/c mobility, very fatigued today, with bil  LE edema today  Min A overall; downgraded goals and D/C home ambulation and w/c goals due to pt now SNF placement  nmr, balance and gait training, activity tolerance, RAFO   Communication             Safety/Cognition/ Behavioral Observations  mild-moderate cognitive deficits, poor awareness of deficits and recall of information   min assist for basic   participation, recall of daily information,  awareness of impairments    Pain   Tylenol #3 for headaches q 4hrs  <3  Note decrease in request for pain    Skin   Fistual to intergluteal fold improving  No additional skin breakdown  Encourage pt to turn q 2hrs    Rehab Goals Patient on target to meet rehab goals: Yes Rehab Goals Revised: Some of pt's goals have been downgraded due to his slow progress. *See Care  Plan and progress notes for long and short-term goals.  Barriers to Discharge: Wife needs assist Sons work at night    Possible Resolutions to Barriers:  ?SNF vs home    Discharge Planning/Teaching Needs:  Pt's family has decided that pt needs to go to SNF due to level of care he needs and the fact that pt's wife is recovering from her recent stroke.  None, as plans as changed to SNF.   Team Discussion:  Pt's BP is finally going down.  He does have some edema and Dr. Letta Pate wants him to have TED hose.  He is also adding and changing some medications.  Pt is medically stable for transfer to SNF.  OT stated that pt is making progress with bathing and dressing and is at min assist with sit to stand at mod assist.  Pt fatigues easily with PT, but pt is walking 15' with RW and mod assist.  PT wants to get pt an AFO before he transfers to SNF.  Pt has limited participation with speech therapist and has poor awareness of his deficits with poor recall.  Revisions to Treatment Plan:  none   Continued Need for Acute Rehabilitation Level of Care: The patient requires daily medical management by a physician with specialized training in physical medicine and rehabilitation for the following conditions: Daily direction of a multidisciplinary physical rehabilitation program to ensure safe treatment while eliciting the highest outcome that is of practical value to the patient.: Yes Daily medical management of patient stability for increased activity during participation in an intensive rehabilitation regime.: Yes Daily analysis of laboratory values and/or radiology reports with any subsequent need for medication adjustment of medical intervention for : Blood pressure problems;Neurological problems  [name], Silvestre Mesi 12/09/2015, 12:35 AM

## 2015-12-09 NOTE — Progress Notes (Signed)
Speech Language Pathology Discharge Summary  Patient Details  Name: Patrick [name] MRN: [id number] Date of Birth: [birth year]  Today's Date: 12/09/2015 SLP Individual Time: 1315-1345 SLP Individual Time Calculation (min): 30 min   Skilled Therapeutic Interventions:  Pt was seen for skilled ST targeting cognitive goals.  Pt's family was present during today's therapy and SLP provided skilled education regarding current goals and progress in therapy.  SLP also discussed memory compensatory strategies to facilitate pt's recall of daily information.  Pt was able to recall at least 3 details from previous AM therapy sessions with min question cues and more than a reasonable amount of time.  All pt's family's questions were answered to their satisfaction at this time.  Pt is discharging to SNF this afternoon.  Pt left in recliner with family at bedside.     Patient has met 3 of 3 long term goals.  Patient to discharge at [facility] level.  Reasons goals not met:  n/a  Clinical Impression/Discharge Summary:  Pt made slow, inconsistent gains while inpatient and is discharging having met 3 out of 3 short term goals.  Overall pt is a min assist for basic, cognitive tasks but can require anywhere from supervision to max assist for cognitive tasks due to lethargy and motivation to participate in therapies.  Question new deficits s/p acute CVA versus depression versus underlying baseline cognitive impairments due to old stroke and ?dementia.  Pt is discharging to SNF where it is recommended that he receive ongoing ST services to continue addressing cognitive deficits in order to facilitate cognitive remediation and compensation.  Pt and family education is complete for this venue of care but recommend ongoing ST education at next level of care.    Care Partner:  Caregiver Able to Provide Assistance: Other (comment) (SNF)  Type of Caregiver Assistance:  (SNF)  Recommendation:  Skilled Nursing facility   Rationale for SLP Follow Up: Maximize cognitive function and independence;Reduce caregiver burden   Equipment: none recommended by SLP    Reasons for discharge: Discharged from hospital   Patient/Family Agrees with Progress Made and Goals Achieved: Yes   Function:  Eating Eating   Modified Consistency Diet: No Eating Assist Level: More than reasonable amount of time;Set up assist for   Eating Set Up Assist For: Cutting food;Opening containers       Cognition Comprehension Comprehension assist level: Understands basic 90% of the time/cues < 10% of the time  Expression   Expression assist level: Expresses basic 75 - 89% of the time/requires cueing 10 - 24% of the time. Needs helper to occlude trach/needs to repeat words.  Social Interaction Social Interaction assist level: Interacts appropriately 50 - 74% of the time - May be physically or verbally inappropriate.  Problem Solving Problem solving assist level: Solves basic 50 - 74% of the time/requires cueing 25 - 49% of the time  Memory Memory assist level: Recognizes or recalls 50 - 74% of the time/requires cueing 25 - 49% of the time   Emilio Math 12/09/2015, 1:53 PM

## 2015-12-09 NOTE — Progress Notes (Signed)
Social Work  Discharge Note  The overall goal for the admission was met for:   Discharge location: [facility]  Length of Stay: No-17 DAYS  Discharge activity level: Yes-MIN LEVEL  Home/community participation: Yes  Services provided included: MD, RD, PT, OT, SLP, RN, CM, TR, Pharmacy, Neuropsych and SW  Financial Services: Medicare  Follow-up services arranged: Other: SHORT TERM NHP  Comments (or additional information):PLAN FOR SHORT TERM NHP UNTIL CAN RETURN HOME WITH WIFE AND SON'S ASSISTING. FAMILY HERE TO COMPLETED PAPERWORK WITH ADMISSION COORDINATOR FOR [facility] AND NOW READY TO TRANSFER TO FACILITY  Patient/Family verbalized understanding of follow-up arrangements: Yes  Individual responsible for coordination of the follow-up plan: SON  Confirmed correct DME delivered: ,  G 12/09/2015    ,  G 

## 2015-12-09 NOTE — Progress Notes (Signed)
ANTICOAGULATION CONSULT NOTE - Follow Up Consult  Pharmacy Consult for coumadin Indication: atrial fibrillation  No Known Allergies  Patient Measurements: Height: 5' 9.5" (176.5 cm) Weight: 169 lb 9.6 oz (76.93 kg) IBW/kg (Calculated) : 71.85 Heparin Dosing Weight:   Vital Signs: Temp: 98.1 F (36.7 C) (05/12 0620) Temp Source: Oral (05/12 0620) BP: 163/94 mmHg (05/12 0620) Pulse Rate: 71 (05/12 0620)  Labs:  Recent Labs  12/07/15 0526 12/08/15 0441 12/08/15 0938 12/09/15 0513  LABPROT 24.0* 24.1*  --  23.2*  INR 2.17* 2.18*  --  2.08*  CREATININE  --   --  2.86*  --     Estimated Creatinine Clearance: 25.1 mL/min (by C-G formula based on Cr of 2.86).   Medications:  Scheduled:  . aspirin EC  81 mg Oral Daily  . cloNIDine  0.3 mg Oral TID  . diclofenac sodium  4 g Topical QID  . diltiazem  300 mg Oral Daily  . donepezil  5 mg Oral QHS  . feeding supplement (NEPRO CARB STEADY)  237 mL Oral TID BM  . glimepiride  2 mg Oral Q breakfast  . hydrALAZINE  50 mg Oral Q8H  . hydrochlorothiazide  6.25 mg Oral Daily  . insulin aspart  0-20 Units Subcutaneous TID WC  . insulin aspart  0-5 Units Subcutaneous QHS  . mirtazapine  7.5 mg Oral QHS  . polyethylene glycol  17 g Oral Daily  . senna-docusate  2 tablet Oral BID  . warfarin  2.5 mg Oral Once per day on Mon Thu  . warfarin  5 mg Oral Once per day on Sun Tue Wed Fri Sat  . Warfarin - Pharmacist Dosing Inpatient   Does not apply q1800   Infusions:    Assessment: 70 yo male with afib is currently on therapeutic coumadin.  INR today is 2.08.  Goal of Therapy:  INR 2-3 Monitor platelets by anticoagulation protocol: Yes   Plan:  - continue coumadin 5 mg po daily except 2.5 mg on Mon and Thurs - change INR to MWF   Patrick [name], Patrick [name] 12/09/2015,8:11 AM

## 2015-12-09 NOTE — Progress Notes (Signed)
Subjective/Complaints: No new issues overnite  ROS- denies any CP/SOB, N/V/D  Objective: Vital Signs: Blood pressure 163/94, pulse 71, temperature 98.1 F (36.7 C), temperature source Oral, resp. rate 18, height 5' 9.5" (1.765 m), weight 76.93 kg (169 lb 9.6 oz), SpO2 100 %. Dg Chest 2 View  12/08/2015  CLINICAL DATA:  70-year-old male presenting for routine insult health maintenance. EXAM: CHEST  2 VIEW COMPARISON:  Chest radiograph dated 06/24/2013 FINDINGS: Two views of the chest do not demonstrate a focal consolidation. There is no pleural effusion or pneumothorax. Stable mild cardiomegaly. No acute osseous pathology. IMPRESSION: No active cardiopulmonary disease. Mild cardiomegaly. Electronically Signed   By: Arash  Radparvar M.D.   On: 12/08/2015 19:52   Results for orders placed or performed during the hospital encounter of 11/22/15 (from the past 72 hour(s))  Glucose, capillary     Status: Abnormal   Collection Time: 12/06/15 12:00 PM  Result Value Ref Range   Glucose-Capillary 168 (H) 65 - 99 mg/dL  Glucose, capillary     Status: Abnormal   Collection Time: 12/06/15  4:49 PM  Result Value Ref Range   Glucose-Capillary 131 (H) 65 - 99 mg/dL  Glucose, capillary     Status: Abnormal   Collection Time: 12/06/15  9:18 PM  Result Value Ref Range   Glucose-Capillary 114 (H) 65 - 99 mg/dL  Protime-INR     Status: Abnormal   Collection Time: 12/07/15  5:26 AM  Result Value Ref Range   Prothrombin Time 24.0 (H) 11.6 - 15.2 seconds   INR 2.17 (H) 0.00 - 1.49  Glucose, capillary     Status: None   Collection Time: 12/07/15  6:40 AM  Result Value Ref Range   Glucose-Capillary 75 65 - 99 mg/dL  Glucose, capillary     Status: Abnormal   Collection Time: 12/07/15 11:46 AM  Result Value Ref Range   Glucose-Capillary 137 (H) 65 - 99 mg/dL  Glucose, capillary     Status: Abnormal   Collection Time: 12/07/15  4:46 PM  Result Value Ref Range   Glucose-Capillary 260 (H) 65 - 99 mg/dL   Glucose, capillary     Status: None   Collection Time: 12/07/15  9:00 PM  Result Value Ref Range   Glucose-Capillary 77 65 - 99 mg/dL  Protime-INR     Status: Abnormal   Collection Time: 12/08/15  4:41 AM  Result Value Ref Range   Prothrombin Time 24.1 (H) 11.6 - 15.2 seconds   INR 2.18 (H) 0.00 - 1.49  Glucose, capillary     Status: None   Collection Time: 12/08/15  7:01 AM  Result Value Ref Range   Glucose-Capillary 84 65 - 99 mg/dL  Basic metabolic panel     Status: Abnormal   Collection Time: 12/08/15  9:38 AM  Result Value Ref Range   Sodium 137 135 - 145 mmol/L   Potassium 4.1 3.5 - 5.1 mmol/L   Chloride 104 101 - 111 mmol/L   CO2 19 (L) 22 - 32 mmol/L   Glucose, Bld 161 (H) 65 - 99 mg/dL   BUN 45 (H) 6 - 20 mg/dL   Creatinine, Ser 2.86 (H) 0.61 - 1.24 mg/dL   Calcium 9.0 8.9 - 10.3 mg/dL   GFR calc non Af Amer 21 (L) >60 mL/min   GFR calc Af Amer 24 (L) >60 mL/min    Comment: (NOTE) The eGFR has been calculated using the CKD EPI equation. This calculation has not been validated in all   clinical situations. eGFR's persistently <60 mL/min signify possible Chronic Kidney Disease.    Anion gap 14 5 - 15  Glucose, capillary     Status: Abnormal   Collection Time: 12/08/15 11:25 AM  Result Value Ref Range   Glucose-Capillary 242 (H) 65 - 99 mg/dL  Glucose, capillary     Status: None   Collection Time: 12/08/15  4:55 PM  Result Value Ref Range   Glucose-Capillary 75 65 - 99 mg/dL  Glucose, capillary     Status: Abnormal   Collection Time: 12/08/15  9:29 PM  Result Value Ref Range   Glucose-Capillary 146 (H) 65 - 99 mg/dL  Protime-INR     Status: Abnormal   Collection Time: 12/09/15  5:13 AM  Result Value Ref Range   Prothrombin Time 23.2 (H) 11.6 - 15.2 seconds   INR 2.08 (H) 0.00 - 1.49     HEENT: Normocephalic, atraumatic. Cardio: RRR and no murmur Resp: CTA B/L and unlabored GI: BS positive and NT, ND Skin:   Intact. Warm and dry Neuro: Alert/Oriented x3   Motor 4+/5 in RUE 2/5 RLE 5/5 LUE/LLE Dysarthric Musc/Skel:  No edema. No tenderness. Gen NAD. Vital signs reviewed. 1+ edema in RLE,\1+ in LLE Calf NT, neg Homan  Assessment/Plan: 1. Functional deficits secondary to bilateral posterior frontal and parietal infarct with R LE> UE paresis Stable for D/C to  today F/u PCP in 3-4 weeks F/u PM&R 2 weeks See D/C summary See D/C instructionsFIM: Function - Bathing Position: Wheelchair/chair at sink Body parts bathed by patient: Right arm, Left arm, Chest, Abdomen, Front perineal area, Right upper leg, Left upper leg, Right lower leg, Left lower leg Body parts bathed by helper: Buttocks, Back Bathing not applicable: Back Assist Level:  (min A)  Function- Upper Body Dressing/Undressing What is the patient wearing?: Hospital gown Pull over shirt/dress - Perfomed by patient: Thread/unthread right sleeve, Thread/unthread left sleeve, Put head through opening, Pull shirt over trunk Pull over shirt/dress - Perfomed by helper: Pull shirt over trunk Assist Level: Supervision or verbal cues Set up : To obtain clothing/put away Function - Lower Body Dressing/Undressing What is the patient wearing?: AFO, Shoes, Ted Hose, Pants, Non-skid slipper socks Position: Wheelchair/chair at sink Underwear - Performed by patient: Thread/unthread left underwear leg, Thread/unthread right underwear leg Underwear - Performed by helper: Pull underwear up/down Pants- Performed by patient: Thread/unthread right pants leg, Thread/unthread left pants leg Pants- Performed by helper: Pull pants up/down Non-skid slipper socks- Performed by patient: Don/doff right sock, Don/doff left sock Non-skid slipper socks- Performed by helper: Don/doff right sock Socks - Performed by patient: Don/doff right sock, Don/doff left sock Shoes - Performed by patient: Fasten left, Don/doff left shoe Shoes - Performed by helper: Don/doff right shoe, Fasten right AFO - Performed by  helper: Don/doff right AFO TED Hose - Performed by helper: Don/doff right TED hose, Don/doff left TED hose Assist for footwear: Partial/moderate assist Assist for lower body dressing: Touching or steadying assistance (Pt > 75%)  Function - Toileting Toileting activity did not occur: No continent bowel/bladder event Toileting steps completed by patient: Performs perineal hygiene Toileting steps completed by helper: Adjust clothing prior to toileting, Adjust clothing after toileting Toileting Assistive Devices: Grab bar or rail Assist level: Touching or steadying assistance (Pt.75%)  Function - Toilet Transfers Toilet transfer assistive device: Elevated toilet seat/BSC over toilet, Grab bar Assist level to toilet: Moderate assist (Pt 50 - 74%/lift or lower) Assist level from toilet: Moderate assist (Pt 50 -   74%/lift or lower) Assist level to bedside commode (at bedside): Maximal assist (Pt 25 - 49%/lift and lower) Assist level from bedside commode (at bedside): Maximal assist (Pt 25 - 49%/lift and lower)  Function - Chair/bed transfer Chair/bed transfer activity did not occur: Refused Chair/bed transfer method: Squat pivot Chair/bed transfer assist level: Moderate assist (Pt 50 - 74%/lift or lower) Chair/bed transfer assistive device: Orthosis Chair/bed transfer details: Verbal cues for technique, Verbal cues for precautions/safety, Verbal cues for sequencing, Manual facilitation for placement, Manual facilitation for weight bearing, Manual facilitation for weight shifting, Tactile cues for placement  Function - Locomotion: Wheelchair Will patient use wheelchair at discharge?: Yes Type: Manual Max wheelchair distance: 50 Assist Level: Supervision or verbal cues Wheel 50 feet with 2 turns activity did not occur: Safety/medical concerns (lethargy,fatigue) Assist Level: Supervision or verbal cues Wheel 150 feet activity did not occur: Safety/medical concerns (lethargy, fatigue) Turns  around,maneuvers to table,bed, and toilet,negotiates 3% grade,maneuvers on rugs and over doorsills: No Function - Locomotion: Ambulation Ambulation activity did not occur: Safety/medical concerns (unsafe due to knee buckling) Assistive device: Walker-Eva, Orthosis Max distance: 20 Assist level: Moderate assist (Pt 50 - 74%) Assist level: Moderate assist (Pt 50 - 74%)  Function - Comprehension Comprehension: Auditory Comprehension assistive device: Hearing aids Comprehension assist level: Understands basic 90% of the time/cues < 10% of the time  Function - Expression Expression: Verbal Expression assist level: Expresses basic 75 - 89% of the time/requires cueing 10 - 24% of the time. Needs helper to occlude trach/needs to repeat words.  Function - Social Interaction Social Interaction assist level: Interacts appropriately 50 - 74% of the time - May be physically or verbally inappropriate.  Function - Problem Solving Problem solving assist level: Solves basic 50 - 74% of the time/requires cueing 25 - 49% of the time  Function - Memory Memory assist level: Recognizes or recalls 50 - 74% of the time/requires cueing 25 - 49% of the time Patient normally able to recall (first 3 days only): Current season  Medical Problem List and Plan: 1.  Mobilty,cognitive and functional deficits secondary to bi-cerebral embolic CVA    d/c to SNF 2.  DVT Prophylaxis/Anticoagulation: Pharmaceutical: Coumadin              -pt has been therapeutic or supra therapeutic on Warfarin   3. Pain Management:  T#3 prn for headache, d/ced tramadol, if not better as BP normalizes will trial topamax       4. Mood: team to provide ego support 5. Neuropsych: This patient is not capable of making decisions on his own behalf--question of mild cognitive deficits at baseline.   6. Skin/Wound Care: routine pressure relief measures. Add supplements between meals.   7. Fluids/Electrolytes/Nutrition:  Monitor I/O. Meal  intake improved,recheck BMET BMP Latest Ref Rng 12/08/2015 12/02/2015 11/25/2015  Glucose 65 - 99 mg/dL 161(H) 159(H) 159(H)  BUN 6 - 20 mg/dL 45(H) 55(H) 51(H)  Creatinine 0.61 - 1.24 mg/dL 2.86(H) 2.80(H) 3.19(H)  Sodium 135 - 145 mmol/L 137 136 132(L)  Potassium 3.5 - 5.1 mmol/L 4.1 4.0 3.9  Chloride 101 - 111 mmol/L 104 103 97(L)  CO2 22 - 32 mmol/L 19(L) 23 19(L)  Calcium 8.9 - 10.3 mg/dL 9.0 8.7(L) 8.9    8. HTN:  -holding  , hctz, lisinopril,  Cont  HCTZ 6.25mg, to lower BP and help with edema, no drop in K thus far  restarted catapres .3mg TID  Increased Cardizem to 300   Hydralazine TID scheduled   dose,   increased to 50 mg on 5/7  Filed Vitals:   12/09/15 0002 12/09/15 0620  BP: 160/63 163/94  Pulse: 60 71  Temp: 98.3 F (36.8 C) 98.1 F (36.7 C)  Resp: 20 18   9. A Fib: cardizem/warfarin. Continue Cardizem 10 Hyponatremia: mild. Encourage appropriate nutrition 11. CKD: Follow i's and o's, repeat BMET stable creat sl better    12. H/o gout: allopurinol d/c due to increased creat, no flares 13. Gastric ulcers/GIB:  Protonix, monitor any bleeding while on warfarin 14.  Hematuria resolved 15.  Elevated CBG, Hgb A1C 8.5 on 4/30. Improved on  amaryl  27m, increased to 4 mg on 5/6, am CBG 75 will cont  270mCBG (last 3)   Recent Labs  12/08/15 1125 12/08/15 1655 12/08/15 2129  GLUCAP 242* 75 146*  16.  Vascular dementia-will trial aricept although do not expect marked effect,also on remeron 17.  LE edema R>L venous insuff, renal disease and effect of meds, likely hydralazine, hesitate to use diuretic due to renal fxn, will elevate , order teds LOS (Days) 17 A FACE TO FACE EVALUATION WAS PERFORMED  KIRSTEINS,ANDREW E 12/09/2015, 7:15 AM

## 2015-12-09 NOTE — Plan of Care (Signed)
Problem: RH Problem Solving Goal: LTG Patient will demonstrate problem solving for (SLP) LTG: Patient will demonstrate problem solving for basic/complex daily situations with cues (SLP)  Outcome: Completed/Met Date Met:  12/09/15 Pt has met goal but is inconsistent and can fluctuate between max assist and supervision depending on fatigue

## 2015-12-09 NOTE — Progress Notes (Signed)
Social Work Patient ID: Rhyatt Muska, male   DOB: 11/21/1945, 70 y.o.   MRN: [id number]   CSW met with pt and his son, Roselyn Reef, and later with his other son, Gerald Stabs, and pt's to discuss d/c plan.  Family has decided for sure to pursue SNF.  CSW faxed FL2 out and provided family with SNF list.  Pt's wife's sister went to SNF twice at [facility], so they would like for pt to go there, if possible.  [facility] had a bed for pt and will most likely be able to accept pt on 12-09-15.  Pt's family was pleased and wants pt to have a private room, so if [facility] doesn't have one, they will be okay with [facility]. CSW will f/u with Isaias Cowman and then CSW will ask Ovidio Kin, CSW, to help facilitate pt's transfer as this CSW will not be in the hospital 12-09-15.

## 2015-12-12 ENCOUNTER — Encounter: Payer: Self-pay | Admitting: Internal Medicine

## 2015-12-12 ENCOUNTER — Ambulatory Visit: Payer: Medicare Other

## 2015-12-12 NOTE — Progress Notes (Signed)
A user error has taken place: encounter opened in error, closed for administrative reasons.

## 2015-12-19 LAB — POCT INR: INR: 2.5 — AB (ref 0.9–1.1)

## 2015-12-20 ENCOUNTER — Non-Acute Institutional Stay (SKILLED_NURSING_FACILITY): Payer: Medicare Other | Admitting: Family

## 2015-12-20 ENCOUNTER — Encounter: Payer: Self-pay | Admitting: Family

## 2015-12-20 ENCOUNTER — Encounter: Payer: Medicare Other | Admitting: Physical Medicine & Rehabilitation

## 2015-12-20 DIAGNOSIS — I5032 Chronic diastolic (congestive) heart failure: Secondary | ICD-10-CM

## 2015-12-20 DIAGNOSIS — R600 Localized edema: Secondary | ICD-10-CM

## 2015-12-20 DIAGNOSIS — I10 Essential (primary) hypertension: Secondary | ICD-10-CM

## 2015-12-20 MED ORDER — FUROSEMIDE 40 MG PO TABS: ORAL_TABLET | ORAL | Status: DC

## 2015-12-20 NOTE — Progress Notes (Signed)
Patient ID: Patrick [name], male   DOB: 1946/06/14, 70 y.o.   MRN: [id number]  Location:  [facility] Room Number: 106 Place of Service:  SNF (31) Provider: Dinah Ngetich FNP-C   Blanchie Serve, MD  Patient Care Team: Blanchie Serve, MD as PCP - General (Internal Medicine) [place], [place] ([employer])  Extended Emergency Contact Information Primary Emergency Contact: Buttrey,[name] Address: [address]          [place], [place] [address] [place] Phone: [phone] Mobile Phone: [phone] Relation: Spouse Secondary Emergency Contact: Mancia,[name]  [place] Mobile Phone: [phone] Relation: Son  Code Status:  Full Code  Goals of care: Advanced Directive information Advanced Directives 12/20/2015  Does patient have an advance directive? No  Would patient like information on creating an advanced directive? -     Chief Complaint  Patient presents with  . Acute Visit    HTN, edema     HPI:  Pt is a 70 y.o. male seen today at [facility]  for an acute visit for elevated blood pressure and worsening leg edema. He has a significant medical history of HTN, diastolic CHF, CKD, Depression , CVA, Type 2 DM among others. He is seen in his room today. He denies any acute issues. Facility log Sys B/p ranging from 140's-180's. Facility Nurse reports worsening bilateral leg edema.He denies any shortness of breath or wheezing. He reports occasional cough.    Past Medical History  Diagnosis Date  . Hypertension   . CKD (chronic kidney disease), stage II   . CAD (coronary artery disease)   . Depression   . Gout   . Hyperlipidemia   . 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 GERD (gastroesophageal reflux disease)   . Diabetes mellitus type II     Insulin dependent  . Upper GI bleeding 07/01/2013  . Paroxysmal atrial  fibrillation (HCC)     on coumadin  . Atrial fibrillation ([place])   . Myocardial infarction [facility]) 2000's    "near heart attack" (07/14/2013)  . Shortness of breath     "can happen at any time" (07/14/2013)  . Sleep apnea 07/2010    "has mask at home; seldom uses it" (07/14/2013)  . Stomach ulcer 1950's    "as a teenager"  . Stroke [facility]) ~ 2007; ~ 2009    "memory not as good since" (07/14/2013)  . Permanent atrial fibrillation ([place]) 07/17/2013  . Chronic anticoagulation, on coumadin 07/17/2013  . Tachy-brady syndrome ([place]) 07/17/2013   Past Surgical History  Procedure Laterality Date  . Cardiac catheterization  07/2003    /encounter notes 08/27/2005 (07/01/2013)    No Known Allergies    Medication List       This list is accurate as of: 12/20/15  3:50 PM.  Always use your most recent med list.               acetaminophen 325 MG tablet  Commonly known as:  TYLENOL  Take 2 tablets (650 mg total) by mouth every 6 (six) hours as needed for moderate pain or headache.     aspirin 81 MG EC tablet  Take 81 mg by mouth daily.     cloNIDine 0.3 MG tablet  Commonly known as:  CATAPRES  Take 1 tablet (0.3 mg total) by mouth  3 (three) times daily. Please resume the morning of 11/23/15.     diclofenac sodium 1 % Gel  Commonly known as:  VOLTAREN  Apply 4 g topically 4 (four) times daily.     diltiazem 300 MG 24 hr capsule  Commonly known as:  CARDIZEM CD  Take 1 capsule (300 mg total) by mouth daily.     donepezil 5 MG tablet  Commonly known as:  ARICEPT  Take 1 tablet (5 mg total) by mouth at bedtime.     glimepiride 2 MG tablet  Commonly known as:  AMARYL  Take 1 tablet (2 mg total) by mouth daily with breakfast.     hydrALAZINE 50 MG tablet  Commonly known as:  APRESOLINE  Take 1 tablet (50 mg total) by mouth every 8 (eight) hours.     hydrochlorothiazide 10 mg/mL Susp  Take 0.63 mLs (6.25 mg total) by mouth daily.     mirtazapine 7.5 MG tablet  Commonly known as:   REMERON  Take 1 tablet (7.5 mg total) by mouth at bedtime.     polyethylene glycol packet  Commonly known as:  MIRALAX / GLYCOLAX  Take 17 g by mouth daily.     senna-docusate 8.6-50 MG tablet  Commonly known as:  Senokot-S  Take 2 tablets by mouth 2 (two) times daily.     warfarin 2.5 MG tablet  Commonly known as:  COUMADIN  Use 5 on Mon, Tue, Wed, Fri, Sat, Sun.  Use 2.5 mg on Mon and Thurs.        Review of Systems  Constitutional: Negative for fever, chills, activity change, appetite change and fatigue.  HENT: Negative for congestion, rhinorrhea, sinus pressure, sneezing and sore throat.   Eyes: Negative.   Respiratory: Negative for chest tightness, shortness of breath and wheezing.   Gastrointestinal: Negative for nausea, vomiting, abdominal pain, diarrhea, constipation and abdominal distention.  Genitourinary: Negative for dysuria, urgency, frequency and flank pain.  Musculoskeletal: Positive for gait problem.  Skin: Negative.   Neurological: Negative for dizziness, light-headedness and headaches.  Psychiatric/Behavioral: Negative for hallucinations, confusion, sleep disturbance and agitation.    Immunization History  Administered Date(s) Administered  . Influenza Split 07/05/2011  . Influenza Whole 06/09/2008, 05/26/2009, 05/08/2010  . Influenza, Seasonal, Injecte, Preservative Fre 08/04/2012  . Influenza,inj,Quad PF,36+ Mos 03/29/2014, 08/29/2015  . Influenza-Unspecified 03/30/2013  . PPD Test 12/09/2015  . Pneumococcal Polysaccharide-23 05/26/2009   Pertinent  Health Maintenance Due  Topic Date Due  . OPHTHALMOLOGY EXAM  07/01/2015  . PNA vac Low Risk Adult (1 of 2 - PCV13) 12/27/2016 (Originally 11/30/2012)  . HEMOGLOBIN A1C  02/26/2016  . INFLUENZA VACCINE  02/28/2016  . FOOT EXAM  08/28/2016  . LIPID PANEL  11/19/2016  . COLONOSCOPY  12/03/2017   Fall Risk  11/15/2015 09/26/2015 08/29/2015 10/18/2014 05/10/2014  Falls in the past year? Yes Yes Yes No No    Number falls in past yr: 2 or more 2 or more 2 or more - -  Injury with Fall? No No No - -  Risk Factor Category  High Fall Risk High Fall Risk High Fall Risk - -  Risk for fall due to : History of fall(s);Impaired vision;Other (Comment) Impaired balance/gait;Impaired vision;History of fall(s) Impaired balance/gait;Impaired vision - -  Risk for fall due to (comments): bilateral leg pain - - - -  Follow up Education provided;Falls prevention discussed Falls prevention discussed Falls prevention discussed - -   Functional Status Survey:    Filed Vitals:  12/20/15 1030  BP: 156/79  Pulse: 54  Temp: 97.8 F (36.6 C)  TempSrc: Oral  Resp: 18  Height: 5\' 9"  (1.753 m)  Weight: 169 lb (76.658 kg)  SpO2: 97%   Body mass index is 24.95 kg/(m^2). Physical Exam  Constitutional: He appears well-developed and well-nourished. No distress.  HENT:  Head: Normocephalic.  Eyes: Conjunctivae and EOM are normal. Pupils are equal, round, and reactive to light. Right eye exhibits no discharge. Left eye exhibits no discharge. No scleral icterus.  Neck: Normal range of motion. No JVD present. No thyromegaly present.  Cardiovascular: Normal rate, regular rhythm, normal heart sounds and intact distal pulses.  Exam reveals no gallop and no friction rub.   No murmur heard. Pulmonary/Chest: Effort normal and breath sounds normal. No respiratory distress. He has no wheezes. He has no rales.  Abdominal: Soft. Bowel sounds are normal. He exhibits no distension. There is no tenderness. There is no rebound and no guarding.  Musculoskeletal: Normal range of motion.  Bilateral +2-3 pitting edema. Lower extremities weakness.   Lymphadenopathy:    He has no cervical adenopathy.  Neurological: He is alert.  Skin: Skin is warm and dry. No rash noted. No erythema. No pallor.  Psychiatric: He has a normal mood and affect.    Labs reviewed:  Recent Labs  11/20/15 0424 11/21/15 0210 11/22/15 0540   11/25/15 0723 12/02/15 1016 12/08/15 0938  NA 138 137 134*  < > 132* 136 137  K 4.2 3.8 3.5  < > 3.9 4.0 4.1  CL 103 101 98*  < > 97* 103 104  CO2 22 24 22   < > 19* 23 19*  GLUCOSE 195* 199* 179*  < > 159* 159* 161*  BUN 31* 27* 28*  < > 51* 55* 45*  CREATININE 2.49* 2.33* 2.37*  < > 3.19* 2.80* 2.86*  CALCIUM 8.9 9.0 9.3  < > 8.9 8.7* 9.0  MG 1.8 1.7 1.7  --   --   --   --   PHOS 2.9 2.9 3.5  --   --   --   --   < > = values in this interval not displayed.  Recent Labs  11/15/15 1143 11/18/15 1359 11/23/15 0515  AST 13 23 26   ALT 11 13* 25  ALKPHOS 85 76 70  BILITOT 0.6 1.0 0.9  PROT 7.1 7.8 6.2*  ALBUMIN 4.3 4.4 3.2*    Recent Labs  11/18/15 1359 11/22/15 0540 11/23/15 0515 11/24/15 1018  WBC 7.1 6.1 7.2 8.0  NEUTROABS 5.9  --  5.2  --   HGB 12.9* 13.9 14.0 13.4  HCT 40.6 42.1 43.1 41.4  MCV 80.1 77.2* 77.5* 78.3  PLT 241 196 170 186   Lab Results  Component Value Date   TSH 2.291 10/18/2014   Lab Results  Component Value Date   HGBA1C 8.5* 11/27/2015   Lab Results  Component Value Date   CHOL 128 11/20/2015   HDL 48 11/20/2015   LDLCALC 65 11/20/2015   TRIG 77 11/20/2015   CHOLHDL 2.7 11/20/2015    Significant Diagnostic Results in last 30 days:  Dg Chest 2 View  12/08/2015  CLINICAL DATA:  70 year old male presenting for routine insult health maintenance. EXAM: CHEST  2 VIEW COMPARISON:  Chest radiograph dated 06/24/2013 FINDINGS: Two views of the chest do not demonstrate a focal consolidation. There is no pleural effusion or pneumothorax. Stable mild cardiomegaly. No acute osseous pathology. IMPRESSION: No active cardiopulmonary disease. Mild cardiomegaly.  Electronically Signed   By: Anner Crete M.D.   On: 12/08/2015 19:52   Mr Brain Wo Contrast  11/21/2015  CLINICAL DATA:  70 year old hypertensive male with headache and right-sided weakness. Subsequent encounter. EXAM: MRI HEAD WITHOUT CONTRAST TECHNIQUE: Multiplanar, multiecho pulse  sequences of the brain and surrounding structures were obtained without intravenous contrast. COMPARISON:  11/18/2015 MR and CT. FINDINGS: Interval development of scattered small nonhemorrhagic infarcts medial aspect of the posterior frontal and parietal lobes bilaterally. Remote left thalamic infarct. Blood breakdown products right cerebellum and posterior right temporal -occipital junction suggestive of result of prior hemorrhagic insult. Mild chronic microvascular changes. Global atrophy. Prominent extra-axial spaces over the convexities. Although unchanged from the recent examination, slight increase in size when compared to 2012 (now measuring 1.1 cm on the right and 1.5 cm on the left versus remote 0.9 cm on the right and 1.4 cm on the left in 2012). Inward and slight downward displacement of adjacent brain parenchyma. 3.8 mm midline shift to the left. This may represent the presence of bilateral subdural hygromas. No intracranial mass lesion noted on this unenhanced exam. Major intracranial vascular structures are patent. Superimposed atherosclerotic changes of the vertebral arteries suspected. This is more notable involving the right vertebral artery. Partially empty non expanded sella. Cervical spondylotic changes C3-4. Cervical medullary junction unremarkable. IMPRESSION: Interval development of scattered small nonhemorrhagic infarcts medial aspect of the posterior frontal and parietal lobes bilaterally. Remainder of findings similar to the recent exam including: Remote left thalamic infarct. Blood breakdown products right cerebellum and posterior right temporal -occipital junction suggestive of result of prior hemorrhagic insult. Mild chronic microvascular changes. Global atrophy. Prominent extra-axial spaces over the convexities. Although unchanged from the recent examination, slight increase in size when compared to 2012. Inward and slight downward displacement of adjacent brain parenchyma. 3.8 mm  midline shift to the left. This may represent the presence of bilateral subdural hygromas. Major intracranial vascular structures are patent. Superimposed atherosclerotic changes of the vertebral arteries suspected. This is more notable involving the right vertebral artery. Electronically Signed   By: Genia Del M.D.   On: 11/21/2015 18:20    Assessment/Plan 1. Uncontrolled hypertension Sys B/P ranging in the 140's-180's. Increased lower extremities edema noted. Adding diuretic. Monitor vital signs. Recheck BMP 12/22/2015  2. Diastolic CHF, chronic (HCC) occ cough.Exam finding negative for congestion, wheezing, rales or shortness of breath. Worsening lower extremities  Pitting edema.Start Furosemide 40 mg tablet one by mouth X 1 dose then 20 mg Tablet daily. BMP 12/22/2015  3. Localized edema Worsening lower extremities  Pitting edema.Start Furosemide 40 mg tablet one by mouth X 1 dose then 20 mg Tablet daily. BMP 12/22/2015    Family/ staff Communication: Reviewed plan of care with patient and facility Nurse supervisor. Labs/tests ordered:  BMP 12/22/2015

## 2015-12-21 LAB — POCT INR: INR: 2.5 — AB (ref 0.9–1.1)

## 2015-12-22 ENCOUNTER — Non-Acute Institutional Stay (SKILLED_NURSING_FACILITY): Payer: Medicare Other | Admitting: Family

## 2015-12-22 ENCOUNTER — Encounter: Payer: Self-pay | Admitting: Family

## 2015-12-22 DIAGNOSIS — I10 Essential (primary) hypertension: Secondary | ICD-10-CM | POA: Diagnosis not present

## 2015-12-22 DIAGNOSIS — I63139 Cerebral infarction due to embolism of unspecified carotid artery: Secondary | ICD-10-CM

## 2015-12-22 DIAGNOSIS — I482 Chronic atrial fibrillation: Secondary | ICD-10-CM | POA: Diagnosis not present

## 2015-12-22 DIAGNOSIS — I4821 Permanent atrial fibrillation: Secondary | ICD-10-CM

## 2015-12-22 DIAGNOSIS — I5032 Chronic diastolic (congestive) heart failure: Secondary | ICD-10-CM

## 2015-12-22 DIAGNOSIS — E119 Type 2 diabetes mellitus without complications: Secondary | ICD-10-CM

## 2015-12-22 DIAGNOSIS — R269 Unspecified abnormalities of gait and mobility: Secondary | ICD-10-CM | POA: Diagnosis not present

## 2015-12-22 DIAGNOSIS — N183 Chronic kidney disease, stage 3 unspecified: Secondary | ICD-10-CM

## 2015-12-22 NOTE — Progress Notes (Signed)
Patient ID: Patrick [name], male   DOB: [birth year], 70 y.o.   MRN: [id number]  Location:  [facility] Room Number: [id number] Place of Service:  SNF (31)  Provider:Keylen Uzelac FNP-C   PCP: Patrick Serve, MD Patient Care Team: Patrick Serve, MD as PCP - General (Internal Medicine) Patrick [name], Patrick [name] (Optometry)  Extended Emergency Contact Information Primary Emergency Contact: Patrick [name] Address: [address]          [place], [place] [address] [place] Phone: [phone] Mobile Phone: [phone] Relation: Spouse Secondary Emergency Contact: Patrick [name]  [place] Mobile Phone: [phone] Relation: Son  Code Status:Full Code  Goals of care:  Advanced Directive information Advanced Directives 12/22/2015  Does patient have an advance directive? No  Would patient like information on creating an advanced directive? -     No Known Allergies  Chief Complaint  Patient presents with  . Discharge Note    HPI:  70 y.o. male seen today at [facility] for discharge home. He was here for short term rehabilitation post hospital admission 11/22/2015-12/09/2015 for Hypertensive emergency, HA , weakness and minor right sided weakness. He was treated for stroke due to embolism. He has a significant history of HTN, CKD, CHF, Afib, prior CVA with cognitive impairment among others. He is seen today in his room. He denies any acute issues. He has worked with PT/OT with much improvement. He is stable for discharge home with home health PT/OT to continue with ROM, exercise, Gait stability and muscle strengthening. He will also need a [place] for INR check and B/P monitoring. He will require DME a standard WC with cushion, elevating leg rests, extended handbrakes and anti tippers.Home health services and DME will be arranged by facility social worker prior to discharge. Prescription medication  will be written x 1 month then patient to follow up with PCP in 1-2 weeks.   Past Medical History  Diagnosis Date  . Hypertension   . CKD (chronic kidney disease), stage II   . CAD (coronary artery disease)   . Depression   . Gout   . Hyperlipidemia   . 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 GERD (gastroesophageal reflux disease)   . Diabetes mellitus type II     Insulin dependent  . Upper GI bleeding 07/01/2013  . Paroxysmal atrial fibrillation (HCC)     on coumadin  . Atrial fibrillation ([place])   . Myocardial infarction [facility]) 2000's    "near heart attack" (07/14/2013)  . Shortness of breath     "can happen at any time" (07/14/2013)  . Sleep apnea 07/2010    "has mask at home; seldom uses it" (07/14/2013)  . Stomach ulcer 1950's    "as a teenager"  . Stroke [facility]) ~ 2007; ~ 2009    "memory not as good since" (07/14/2013)  . Permanent atrial fibrillation ([place]) 07/17/2013  . Chronic anticoagulation, on coumadin 07/17/2013  . Tachy-brady syndrome ([place]) 07/17/2013    Past Surgical History  Procedure Laterality Date  . Cardiac catheterization  07/2003    /encounter notes 08/27/2005 (07/01/2013)      reports that he quit smoking about 39 years ago. His smoking use included Cigarettes. He has a 7.5 pack-year smoking history. He has quit using smokeless tobacco. He reports that he does not drink alcohol or  use illicit drugs. Social History   Social History  . Marital Status: Married    [name]  . Number of Children: N/A  . Years of Education: 5   Occupational History  . retired Tourist information centre manager    Social History Main Topics  . Smoking status: Former Smoker -- 0.50 packs/day for 15 years    Types: Cigarettes    Quit date: 10/26/1976  . Smokeless tobacco: Former Systems developer     Comment: 07/14/2013 "quit chewing and dipping in ~ 1970"  . Alcohol Use: No     Comment: 07/14/2013 "drank a little; quit in ~ 1970; never had problem  w/it"  . Drug Use: No  . Sexual Activity: Not Currently   Other Topics Concern  . Not on file   Social History Narrative   Admitted to [facility] 12/09/15   Patrick [name]   Former smoker - stopped 1978   Alcohol stopped 1970   Full Code   Functional Status Survey:    No Known Allergies  Pertinent  Health Maintenance Due  Topic Date Due  . OPHTHALMOLOGY EXAM  07/01/2015  . PNA vac Low Risk Adult (1 of 2 - PCV13) 12/27/2016 (Originally 11/30/2012)  . HEMOGLOBIN A1C  02/26/2016  . INFLUENZA VACCINE  02/28/2016  . FOOT EXAM  08/28/2016  . LIPID PANEL  11/19/2016  . COLONOSCOPY  12/03/2017    Medications:   Medication List       This list is accurate as of: 12/22/15  6:39 PM.  Always use your most recent med list.               acetaminophen 325 MG tablet  Commonly known as:  TYLENOL  Take 2 tablets (650 mg total) by mouth every 6 (six) hours as needed for moderate pain or headache.     aspirin 81 MG EC tablet  Take 81 mg by mouth daily.     cloNIDine 0.3 MG tablet  Commonly known as:  CATAPRES  Take 1 tablet (0.3 mg total) by mouth 3 (three) times daily. Please resume the morning of 11/23/15.     diclofenac sodium 1 % Gel  Commonly known as:  VOLTAREN  Apply 4 g topically 4 (four) times daily.     diltiazem 300 MG 24 hr capsule  Commonly known as:  CARDIZEM CD  Take 1 capsule (300 mg total) by mouth daily.     donepezil 5 MG tablet  Commonly known as:  ARICEPT  Take 1 tablet (5 mg total) by mouth at bedtime.     furosemide 40 MG tablet  Commonly known as:  LASIX  40 mg Tablet by mouth X 1 dose then start Furosemide 20 mg Tablet one by mouth daily     glimepiride 2 MG tablet  Commonly known as:  AMARYL  Take 1 tablet (2 mg total) by mouth daily with breakfast.     hydrALAZINE 50 MG tablet  Commonly known as:  APRESOLINE  Take 1 tablet (50 mg total) by mouth every 8 (eight) hours.     hydrochlorothiazide 10 mg/mL Susp  Take 0.63 mLs (6.25 mg total)  by mouth daily.     mirtazapine 7.5 MG tablet  Commonly known as:  REMERON  Take 1 tablet (7.5 mg total) by mouth at bedtime.     polyethylene glycol packet  Commonly known as:  MIRALAX / GLYCOLAX  Take 17 g by mouth daily.     senna-docusate 8.6-50 MG tablet  Commonly known as:  Senokot-S  Take 2 tablets by mouth 2 (two) times daily.     warfarin 2.5 MG tablet  Commonly known as:  COUMADIN  Use 5 on Mon, Tue, Wed, Fri, Sat, Sun.  Use 2.5 mg on Mon and Thurs.        Review of Systems  Constitutional: Negative for fever, chills, activity change, appetite change and fatigue.  HENT: Negative for congestion, rhinorrhea, sinus pressure, sneezing and sore throat.   Eyes: Negative.   Respiratory: Negative for chest tightness, shortness of breath and wheezing.   Gastrointestinal: Negative for nausea, vomiting, abdominal pain, diarrhea, constipation and abdominal distention.  Genitourinary: Negative for dysuria, urgency, frequency and flank pain.  Musculoskeletal: Positive for gait problem.  Skin: Negative.   Neurological: Negative for dizziness, light-headedness and headaches.  Psychiatric/Behavioral: Negative for hallucinations, confusion, sleep disturbance and agitation.    Filed Vitals:   12/22/15 0940  BP: 153/82  Pulse: 66  Temp: 97.6 F (36.4 C)  TempSrc: Oral  Resp: 20  Height: 5\' 9"  (1.753 m)  Weight: 169 lb (76.658 kg)  SpO2: 99%   Body mass index is 24.95 kg/(m^2). Physical Exam  Constitutional: He appears well-developed and well-nourished. No distress.  HENT:  Head: Normocephalic.  Mouth/Throat: Oropharynx is clear and moist. No oropharyngeal exudate.  Eyes: Conjunctivae and EOM are normal. Pupils are equal, round, and reactive to light. Right eye exhibits no discharge. Left eye exhibits no discharge. No scleral icterus.  Neck: Normal range of motion. No JVD present. No thyromegaly present.  Cardiovascular: Normal rate, regular rhythm, normal heart sounds and  intact distal pulses.  Exam reveals no gallop and no friction rub.   No murmur heard. Pulmonary/Chest: Effort normal and breath sounds normal. No respiratory distress. He has no wheezes. He has no rales.  Abdominal: Soft. Bowel sounds are normal. He exhibits no distension. There is no tenderness. There is no rebound and no guarding.  Musculoskeletal: He exhibits no tenderness.  Right side weakness. +2 edema. Unsteady gait.   Lymphadenopathy:    He has no cervical adenopathy.  Neurological: He is alert.  Skin: Skin is warm and dry. No rash noted. No erythema. No pallor.  Psychiatric: He has a normal mood and affect.    Labs reviewed: Basic Metabolic Panel:  Recent Labs  11/20/15 0424 11/21/15 0210 11/22/15 0540  11/25/15 0723 12/02/15 1016 12/08/15 0938  NA 138 137 134*  < > 132* 136 137  K 4.2 3.8 3.5  < > 3.9 4.0 4.1  CL 103 101 98*  < > 97* 103 104  CO2 22 24 22   < > 19* 23 19*  GLUCOSE 195* 199* 179*  < > 159* 159* 161*  BUN 31* 27* 28*  < > 51* 55* 45*  CREATININE 2.49* 2.33* 2.37*  < > 3.19* 2.80* 2.86*  CALCIUM 8.9 9.0 9.3  < > 8.9 8.7* 9.0  MG 1.8 1.7 1.7  --   --   --   --   PHOS 2.9 2.9 3.5  --   --   --   --   < > = values in this interval not displayed. Liver Function Tests:  Recent Labs  11/15/15 1143 11/18/15 1359 11/23/15 0515  AST 13 23 26   ALT 11 13* 25  ALKPHOS 85 76 70  BILITOT 0.6 1.0 0.9  PROT 7.1 7.8 6.2*  ALBUMIN 4.3 4.4 3.2*   No results for input(s): LIPASE, AMYLASE in the last 8760 hours. No results for input(s): AMMONIA  in the last 8760 hours. CBC:  Recent Labs  11/18/15 1359 11/22/15 0540 11/23/15 0515 11/24/15 1018  WBC 7.1 6.1 7.2 8.0  NEUTROABS 5.9  --  5.2  --   HGB 12.9* 13.9 14.0 13.4  HCT 40.6 42.1 43.1 41.4  MCV 80.1 77.2* 77.5* 78.3  PLT 241 196 170 186   Cardiac Enzymes:  Recent Labs  11/15/15 1143 11/20/15 0424 11/20/15 1126 11/20/15 1955 11/21/15 1602  CKTOTAL 83  --   --   --  75  TROPONINI  --  0.09*  0.10* 0.09*  --    BNP: Invalid input(s): POCBNP CBG:  Recent Labs  12/09/15 0613 12/09/15 1154 12/09/15 1654  GLUCAP 92 161* 109*    Procedures and Imaging Studies During Stay: Dg Chest 2 View  12/08/2015  CLINICAL DATA:  70 year old male presenting for routine insult health maintenance. EXAM: CHEST  2 VIEW COMPARISON:  Chest radiograph dated 06/24/2013 FINDINGS: Two views of the chest do not demonstrate a focal consolidation. There is no pleural effusion or pneumothorax. Stable mild cardiomegaly. No acute osseous pathology. IMPRESSION: No active cardiopulmonary disease. Mild cardiomegaly. Electronically Signed   By: Anner Crete M.D.   On: 12/08/2015 19:52    Assessment/Plan:   1. Uncontrolled hypertension B/p stable. Continue clonidine 0.3 mg tablet three times daily, diltiazem 300 mg Tablet, Hydralazine 50 mg Tablet and Hydrochlorothiazide 6.25 mg Tablet daily. BMP in 1-2 weeks with PCP   2. Diastolic CHF, chronic (HCC) Stable. Continue with Furosemide 20 mg tablet.   3. Diabetes mellitus type 2 in nonobese [facility]) Well controlled. Testing CBG's daily. Continue Amaryl 2 mg Tablet daily.   4. Chronic kidney disease (CKD), stage III (moderate) Secondary to HTN and Type 2 DM. Continue to monitor BMP.   5. Permanent atrial fibrillation (HCC) Continue on warfarin 2.5 mg Tablet on M on and Thurs. 5 mg Tablet on Sun, Tue,Wed, Fri and Sat. Monitor INR due 01/02/2016  6. 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 5 mg Tablet on Sun, Tue,Wed, Fri and Sat. Monitor INR due 01/02/2016 7. Abnormal Gait  S/p CVA with right sided weakness. Has worked with PT/OT now stable for discharge home with home health PT/OT to continue with ROM, exercise, Gait stability and muscle strengthening. He will require DME a standard WC  with cushion, elevating leg rests, extended handbrakes and anti tippers. Fall and safety precautions.    Patient is being discharged with the following home health services:    PT/OT to continue with ROM, exercise, Gait stability and muscle strengthening. [facility] Nurse for INR check and B/P monitoring   Patient is being discharged with the following durable medical equipment:   A standard WC with cushion, elevating leg rests, extended handbrakes and anti tippers.   Prescription medication will be written x 1 month then patient to follow up with PCP in 1-2 weeks.   Patient has been advised to f/u with their PCP in 1-2 weeks to bring them up to date on their rehab stay.  Social services at facility was responsible for arranging this appointment.  Pt was provided with a 30 day supply of prescriptions for medications and refills must be obtained from their PCP.  For controlled substances, a more limited supply may be provided adequate until PCP appointment only.  Future labs/tests needed:  CBC, BMP with PCP.  INR due 01/02/2016

## 2015-12-29 ENCOUNTER — Other Ambulatory Visit: Payer: Self-pay | Admitting: Pulmonary Disease

## 2015-12-30 ENCOUNTER — Telehealth: Payer: Self-pay | Admitting: *Deleted

## 2015-12-30 DIAGNOSIS — I5032 Chronic diastolic (congestive) heart failure: Secondary | ICD-10-CM | POA: Diagnosis not present

## 2015-12-30 DIAGNOSIS — M6281 Muscle weakness (generalized): Secondary | ICD-10-CM | POA: Diagnosis not present

## 2015-12-30 DIAGNOSIS — E1122 Type 2 diabetes mellitus with diabetic chronic kidney disease: Secondary | ICD-10-CM | POA: Diagnosis not present

## 2015-12-30 DIAGNOSIS — Z794 Long term (current) use of insulin: Secondary | ICD-10-CM | POA: Diagnosis not present

## 2015-12-30 DIAGNOSIS — I252 Old myocardial infarction: Secondary | ICD-10-CM | POA: Diagnosis not present

## 2015-12-30 DIAGNOSIS — I110 Hypertensive heart disease with heart failure: Secondary | ICD-10-CM | POA: Diagnosis not present

## 2015-12-30 DIAGNOSIS — N183 Chronic kidney disease, stage 3 (moderate): Secondary | ICD-10-CM | POA: Diagnosis not present

## 2015-12-30 DIAGNOSIS — I69398 Other sequelae of cerebral infarction: Secondary | ICD-10-CM | POA: Diagnosis not present

## 2015-12-30 DIAGNOSIS — I69315 Cognitive social or emotional deficit following cerebral infarction: Secondary | ICD-10-CM | POA: Diagnosis not present

## 2015-12-30 DIAGNOSIS — Z5181 Encounter for therapeutic drug level monitoring: Secondary | ICD-10-CM | POA: Diagnosis not present

## 2015-12-30 DIAGNOSIS — I69351 Hemiplegia and hemiparesis following cerebral infarction affecting right dominant side: Secondary | ICD-10-CM | POA: Diagnosis not present

## 2015-12-30 DIAGNOSIS — I4891 Unspecified atrial fibrillation: Secondary | ICD-10-CM | POA: Diagnosis not present

## 2015-12-30 DIAGNOSIS — I2510 Atherosclerotic heart disease of native coronary artery without angina pectoris: Secondary | ICD-10-CM | POA: Diagnosis not present

## 2015-12-30 DIAGNOSIS — I495 Sick sinus syndrome: Secondary | ICD-10-CM | POA: Diagnosis not present

## 2015-12-30 NOTE — Telephone Encounter (Signed)
Enrique Sack called requesting medication change for Mr yerby. Was discharged last week from [place]. Instructed her to call Miquel Dunn with medication concerns or his PCP. She agreed.

## 2016-01-02 ENCOUNTER — Telehealth: Payer: Self-pay

## 2016-01-02 ENCOUNTER — Telehealth: Payer: Self-pay | Admitting: Pharmacist

## 2016-01-02 DIAGNOSIS — I69315 Cognitive social or emotional deficit following cerebral infarction: Secondary | ICD-10-CM | POA: Diagnosis not present

## 2016-01-02 DIAGNOSIS — E1122 Type 2 diabetes mellitus with diabetic chronic kidney disease: Secondary | ICD-10-CM | POA: Diagnosis not present

## 2016-01-02 DIAGNOSIS — I69351 Hemiplegia and hemiparesis following cerebral infarction affecting right dominant side: Secondary | ICD-10-CM | POA: Diagnosis not present

## 2016-01-02 DIAGNOSIS — M6281 Muscle weakness (generalized): Secondary | ICD-10-CM | POA: Diagnosis not present

## 2016-01-02 DIAGNOSIS — I69398 Other sequelae of cerebral infarction: Secondary | ICD-10-CM | POA: Diagnosis not present

## 2016-01-02 DIAGNOSIS — N183 Chronic kidney disease, stage 3 (moderate): Secondary | ICD-10-CM | POA: Diagnosis not present

## 2016-01-02 NOTE — Telephone Encounter (Signed)
[facility] RN Roberts Gaudy calls with home-bound fingerstick point of care INR = 1.4 on 30mg  warfarin per week based upon last provided instructions from the coumadin clinic. Will increase to 35mg /wk warfarin with 5mg  strength tablet; recollect INR in 1 week. No signs or symptoms of recurrence of embolic disease or bleeding based upon nurses assessment and patient self-reporting.

## 2016-01-02 NOTE — Telephone Encounter (Signed)
LVM

## 2016-01-02 NOTE — Telephone Encounter (Signed)
Patrick [name] from [facility] requesting the nurse to call back regarding bp.

## 2016-01-02 NOTE — Telephone Encounter (Signed)
Thank you Dr. Elie Confer... I concur with your plan

## 2016-01-03 DIAGNOSIS — I69351 Hemiplegia and hemiparesis following cerebral infarction affecting right dominant side: Secondary | ICD-10-CM | POA: Diagnosis not present

## 2016-01-03 DIAGNOSIS — I69315 Cognitive social or emotional deficit following cerebral infarction: Secondary | ICD-10-CM | POA: Diagnosis not present

## 2016-01-03 DIAGNOSIS — M6281 Muscle weakness (generalized): Secondary | ICD-10-CM | POA: Diagnosis not present

## 2016-01-03 DIAGNOSIS — N183 Chronic kidney disease, stage 3 (moderate): Secondary | ICD-10-CM | POA: Diagnosis not present

## 2016-01-03 DIAGNOSIS — I69398 Other sequelae of cerebral infarction: Secondary | ICD-10-CM | POA: Diagnosis not present

## 2016-01-03 DIAGNOSIS — E1122 Type 2 diabetes mellitus with diabetic chronic kidney disease: Secondary | ICD-10-CM | POA: Diagnosis not present

## 2016-01-04 DIAGNOSIS — N183 Chronic kidney disease, stage 3 (moderate): Secondary | ICD-10-CM | POA: Diagnosis not present

## 2016-01-04 DIAGNOSIS — I69351 Hemiplegia and hemiparesis following cerebral infarction affecting right dominant side: Secondary | ICD-10-CM | POA: Diagnosis not present

## 2016-01-04 DIAGNOSIS — E1122 Type 2 diabetes mellitus with diabetic chronic kidney disease: Secondary | ICD-10-CM | POA: Diagnosis not present

## 2016-01-04 DIAGNOSIS — I69315 Cognitive social or emotional deficit following cerebral infarction: Secondary | ICD-10-CM | POA: Diagnosis not present

## 2016-01-04 DIAGNOSIS — M6281 Muscle weakness (generalized): Secondary | ICD-10-CM | POA: Diagnosis not present

## 2016-01-04 DIAGNOSIS — I69398 Other sequelae of cerebral infarction: Secondary | ICD-10-CM | POA: Diagnosis not present

## 2016-01-05 DIAGNOSIS — I69315 Cognitive social or emotional deficit following cerebral infarction: Secondary | ICD-10-CM | POA: Diagnosis not present

## 2016-01-05 DIAGNOSIS — M6281 Muscle weakness (generalized): Secondary | ICD-10-CM | POA: Diagnosis not present

## 2016-01-05 DIAGNOSIS — E1122 Type 2 diabetes mellitus with diabetic chronic kidney disease: Secondary | ICD-10-CM | POA: Diagnosis not present

## 2016-01-05 DIAGNOSIS — I69351 Hemiplegia and hemiparesis following cerebral infarction affecting right dominant side: Secondary | ICD-10-CM | POA: Diagnosis not present

## 2016-01-05 DIAGNOSIS — I69398 Other sequelae of cerebral infarction: Secondary | ICD-10-CM | POA: Diagnosis not present

## 2016-01-05 DIAGNOSIS — N183 Chronic kidney disease, stage 3 (moderate): Secondary | ICD-10-CM | POA: Diagnosis not present

## 2016-01-09 ENCOUNTER — Telehealth: Payer: Self-pay | Admitting: Pharmacist

## 2016-01-09 DIAGNOSIS — E1122 Type 2 diabetes mellitus with diabetic chronic kidney disease: Secondary | ICD-10-CM | POA: Diagnosis not present

## 2016-01-09 DIAGNOSIS — I69351 Hemiplegia and hemiparesis following cerebral infarction affecting right dominant side: Secondary | ICD-10-CM | POA: Diagnosis not present

## 2016-01-09 DIAGNOSIS — I69398 Other sequelae of cerebral infarction: Secondary | ICD-10-CM | POA: Diagnosis not present

## 2016-01-09 DIAGNOSIS — N183 Chronic kidney disease, stage 3 (moderate): Secondary | ICD-10-CM | POA: Diagnosis not present

## 2016-01-09 DIAGNOSIS — M6281 Muscle weakness (generalized): Secondary | ICD-10-CM | POA: Diagnosis not present

## 2016-01-09 DIAGNOSIS — I69315 Cognitive social or emotional deficit following cerebral infarction: Secondary | ICD-10-CM | POA: Diagnosis not present

## 2016-01-09 NOTE — Telephone Encounter (Signed)
[facility] RN at home of patient peforming FS POC INR = 2.3 after 1 week of dose increased from 30mg /wk to 35mg /wk warfarin (5mg  PO QD). Reports no signs or symptoms of bleeding. Will continue 5mg  PO QD, re-collect INR next week and call/text to me.

## 2016-01-09 NOTE — Telephone Encounter (Signed)
This call handled by pharmacy

## 2016-01-11 ENCOUNTER — Ambulatory Visit (INDEPENDENT_AMBULATORY_CARE_PROVIDER_SITE_OTHER): Payer: Medicare Other | Admitting: Pulmonary Disease

## 2016-01-11 ENCOUNTER — Encounter: Payer: Self-pay | Admitting: Pulmonary Disease

## 2016-01-11 VITALS — BP 135/75 | HR 68 | Temp 97.7°F | Ht 68.5 in | Wt 182.9 lb

## 2016-01-11 DIAGNOSIS — E119 Type 2 diabetes mellitus without complications: Secondary | ICD-10-CM | POA: Diagnosis present

## 2016-01-11 DIAGNOSIS — Z8673 Personal history of transient ischemic attack (TIA), and cerebral infarction without residual deficits: Secondary | ICD-10-CM | POA: Diagnosis not present

## 2016-01-11 DIAGNOSIS — Z7984 Long term (current) use of oral hypoglycemic drugs: Secondary | ICD-10-CM

## 2016-01-11 DIAGNOSIS — I69351 Hemiplegia and hemiparesis following cerebral infarction affecting right dominant side: Secondary | ICD-10-CM | POA: Diagnosis not present

## 2016-01-11 DIAGNOSIS — K5900 Constipation, unspecified: Secondary | ICD-10-CM | POA: Diagnosis not present

## 2016-01-11 DIAGNOSIS — Z7901 Long term (current) use of anticoagulants: Secondary | ICD-10-CM

## 2016-01-11 DIAGNOSIS — I10 Essential (primary) hypertension: Secondary | ICD-10-CM | POA: Diagnosis not present

## 2016-01-11 DIAGNOSIS — I69398 Other sequelae of cerebral infarction: Secondary | ICD-10-CM | POA: Diagnosis not present

## 2016-01-11 DIAGNOSIS — E1122 Type 2 diabetes mellitus with diabetic chronic kidney disease: Secondary | ICD-10-CM | POA: Diagnosis not present

## 2016-01-11 DIAGNOSIS — I482 Chronic atrial fibrillation: Secondary | ICD-10-CM | POA: Diagnosis not present

## 2016-01-11 DIAGNOSIS — K5901 Slow transit constipation: Secondary | ICD-10-CM

## 2016-01-11 DIAGNOSIS — Z7982 Long term (current) use of aspirin: Secondary | ICD-10-CM

## 2016-01-11 DIAGNOSIS — I63139 Cerebral infarction due to embolism of unspecified carotid artery: Secondary | ICD-10-CM

## 2016-01-11 DIAGNOSIS — N183 Chronic kidney disease, stage 3 (moderate): Secondary | ICD-10-CM | POA: Diagnosis not present

## 2016-01-11 DIAGNOSIS — I4821 Permanent atrial fibrillation: Secondary | ICD-10-CM

## 2016-01-11 DIAGNOSIS — I69315 Cognitive social or emotional deficit following cerebral infarction: Secondary | ICD-10-CM | POA: Diagnosis not present

## 2016-01-11 DIAGNOSIS — M6281 Muscle weakness (generalized): Secondary | ICD-10-CM | POA: Diagnosis not present

## 2016-01-11 LAB — GLUCOSE, CAPILLARY: Glucose-Capillary: 140 mg/dL — ABNORMAL HIGH (ref 65–99)

## 2016-01-11 NOTE — Progress Notes (Signed)
Subjective:    Patient ID: Patrick [name], male    DOB: [birth year], 70 y.o.   MRN: [id number]  HPI  Patrick [name] is a 70 year old man with history of HTN, CKD stage 3, CAD, depression, gout, HLD, CVA, GERD, DM2, AF on coumadin, OSA presenting for follow up of hypertension.  Home health nurse coming to house. He is doing well at home. His daughter in law is helping him manage his medications. Regaining strength in legs.   Review of Systems  Constitutional: no fevers/chills Eyes: no vision changes Respiratory: no shortness of breath Cardiovascular: no chest pain, no palpitations Gastrointestinal: no nausea/vomiting, no abdominal pain, no diarrhea Hematologic/lymphatic: +chronic edema Neurological: no paresthesias, no weakness  Past Medical History  Diagnosis Date  . Hypertension   . CKD (chronic kidney disease), stage II   . CAD (coronary artery disease)   . Depression   . Gout   . Hyperlipidemia   . 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 GERD (gastroesophageal reflux disease)   . Diabetes mellitus type II     Insulin dependent  . Upper GI bleeding 07/01/2013  . Paroxysmal atrial fibrillation (HCC)     on coumadin  . Atrial fibrillation ([place])   . Myocardial infarction [facility]) 2000's    "near heart attack" (07/14/2013)  . Shortness of breath     "can happen at any time" (07/14/2013)  . Sleep apnea 07/2010    "has mask at home; seldom uses it" (07/14/2013)  . Stomach ulcer 1950's    "as a teenager"  . Stroke [facility]) ~ 2007; ~ 2009    "memory not as good since" (07/14/2013)  . Permanent atrial fibrillation ([place]) 07/17/2013  . Chronic anticoagulation, on coumadin 07/17/2013  . Tachy-brady syndrome ([place]) 07/17/2013    Current Outpatient Prescriptions on File Prior to Visit  Medication Sig Dispense Refill  . acetaminophen (TYLENOL) 325 MG tablet Take 2 tablets (650 mg total) by mouth every 6 (six) hours as needed for  moderate pain or headache.    Marland Kitchen aspirin 81 MG EC tablet Take 81 mg by mouth daily.      . cloNIDine (CATAPRES) 0.3 MG tablet Take 1 tablet (0.3 mg total) by mouth 3 (three) times daily. Please resume the morning of 11/23/15. 90 tablet 5  . diclofenac sodium (VOLTAREN) 1 % GEL Apply 4 g topically 4 (four) times daily. 1 Tube 3  . diltiazem (CARDIZEM CD) 300 MG 24 hr capsule Take 1 capsule (300 mg total) by mouth daily.    Marland Kitchen donepezil (ARICEPT) 5 MG tablet Take 1 tablet (5 mg total) by mouth at bedtime.    . furosemide (LASIX) 40 MG tablet 40 mg Tablet by mouth X 1 dose then start Furosemide 20 mg Tablet one by mouth daily 30 tablet 3  . glimepiride (AMARYL) 2 MG tablet Take 1 tablet (2 mg total) by mouth daily with breakfast.    . hydrALAZINE (APRESOLINE) 50 MG tablet Take 1 tablet (50 mg total) by mouth every 8 (eight) hours.    . hydrochlorothiazide 10 mg/mL SUSP Take 0.63 mLs (6.25 mg total) by mouth daily.    . mirtazapine (REMERON) 7.5 MG tablet Take 1 tablet (7.5 mg total) by mouth at bedtime.    . polyethylene glycol (MIRALAX / GLYCOLAX) packet Take 17 g by mouth daily. 14 each 0  . senna-docusate (SENOKOT-S) 8.6-50 MG tablet Take 2 tablets by mouth 2 (two) times daily.    Marland Kitchen  warfarin (COUMADIN) 2.5 MG tablet Use 5 on Mon, Tue, Wed, Fri, Sat, Sun.  Use 2.5 mg on Mon and Thurs.    . warfarin (COUMADIN) 5 MG tablet Take 2.5 mg tablet on Mondays and Thursdays. 5mg  tablet on Sun, Tue, Wed, Fri and Sat 30 tablet 0   No current facility-administered medications on file prior to visit.   Objective:   Physical Exam  Blood pressure 135/75, pulse 68, temperature 97.7 F (36.5 C), temperature source Oral, height 5' 8.5" (1.74 m), weight 182 lb 14.4 oz (82.963 kg), SpO2 100 %. General Apperance: NAD HEENT: Normocephalic, atraumatic, anicteric sclera Neck: Supple, trachea midline Lungs: Clear to auscultation bilaterally. No wheezes, rhonchi or rales. Breathing comfortably Heart: Regular rate and  rhythm, no murmur/rub/gallop Abdomen: Soft, nontender, nondistended, no rebound/guarding Extremities: Warm and well perfused, no edema Skin: No rashes or lesions Neurologic: Alert and interactive. 4/5 LLE and 3/5 RLE.  Assessment & Plan:  Please refer to problem based charting.

## 2016-01-11 NOTE — Patient Instructions (Addendum)
Start taking docusate and sennosides. Take 2 tablets at night until you have a soft bowel movement.  Follow up in 1-2 months

## 2016-01-12 DIAGNOSIS — I69398 Other sequelae of cerebral infarction: Secondary | ICD-10-CM | POA: Diagnosis not present

## 2016-01-12 DIAGNOSIS — K5900 Constipation, unspecified: Secondary | ICD-10-CM | POA: Insufficient documentation

## 2016-01-12 DIAGNOSIS — E1122 Type 2 diabetes mellitus with diabetic chronic kidney disease: Secondary | ICD-10-CM | POA: Diagnosis not present

## 2016-01-12 DIAGNOSIS — I69315 Cognitive social or emotional deficit following cerebral infarction: Secondary | ICD-10-CM | POA: Diagnosis not present

## 2016-01-12 DIAGNOSIS — N183 Chronic kidney disease, stage 3 (moderate): Secondary | ICD-10-CM | POA: Diagnosis not present

## 2016-01-12 DIAGNOSIS — M6281 Muscle weakness (generalized): Secondary | ICD-10-CM | POA: Diagnosis not present

## 2016-01-12 DIAGNOSIS — I69351 Hemiplegia and hemiparesis following cerebral infarction affecting right dominant side: Secondary | ICD-10-CM | POA: Diagnosis not present

## 2016-01-12 NOTE — Assessment & Plan Note (Signed)
He reports constipation. Docusate not working for him.  Switch to Senna S 2 tablets QHS

## 2016-01-12 NOTE — Assessment & Plan Note (Signed)
Assessment: BP controlled today. 135/75  Plan:  Continue clonidine 0.3mg  TID, diltizem 300mg  daily, Lasix 20mg  daily, hydralazine 50mg  TID, HCTZ 6.25mg  daily, losartan 50mg  daily. Will need to work on titrating down on clonidine.

## 2016-01-12 NOTE — Assessment & Plan Note (Signed)
Assessment: Rate controlled presently. On warfarin.  Plan:  Continue warfarin Continue diltiazem 300mg  daily

## 2016-01-12 NOTE — Progress Notes (Signed)
Internal Medicine Clinic Attending  Case discussed with Dr. Krall at the time of the visit.  We reviewed the resident's history and exam and pertinent patient test results.  I agree with the assessment, diagnosis, and plan of care documented in the resident's note.  

## 2016-01-12 NOTE — Assessment & Plan Note (Signed)
Is on warfarin May consider discontinuing ASA daily He has follow up next month with neurology

## 2016-01-12 NOTE — Assessment & Plan Note (Signed)
Random CBG 140. Continue glimepiride

## 2016-01-13 DIAGNOSIS — I69398 Other sequelae of cerebral infarction: Secondary | ICD-10-CM | POA: Diagnosis not present

## 2016-01-13 DIAGNOSIS — I69351 Hemiplegia and hemiparesis following cerebral infarction affecting right dominant side: Secondary | ICD-10-CM | POA: Diagnosis not present

## 2016-01-13 DIAGNOSIS — N183 Chronic kidney disease, stage 3 (moderate): Secondary | ICD-10-CM | POA: Diagnosis not present

## 2016-01-13 DIAGNOSIS — E1122 Type 2 diabetes mellitus with diabetic chronic kidney disease: Secondary | ICD-10-CM | POA: Diagnosis not present

## 2016-01-13 DIAGNOSIS — I69315 Cognitive social or emotional deficit following cerebral infarction: Secondary | ICD-10-CM | POA: Diagnosis not present

## 2016-01-13 DIAGNOSIS — M6281 Muscle weakness (generalized): Secondary | ICD-10-CM | POA: Diagnosis not present

## 2016-01-16 DIAGNOSIS — I69398 Other sequelae of cerebral infarction: Secondary | ICD-10-CM | POA: Diagnosis not present

## 2016-01-16 DIAGNOSIS — I69315 Cognitive social or emotional deficit following cerebral infarction: Secondary | ICD-10-CM | POA: Diagnosis not present

## 2016-01-16 DIAGNOSIS — M6281 Muscle weakness (generalized): Secondary | ICD-10-CM | POA: Diagnosis not present

## 2016-01-16 DIAGNOSIS — I69351 Hemiplegia and hemiparesis following cerebral infarction affecting right dominant side: Secondary | ICD-10-CM | POA: Diagnosis not present

## 2016-01-16 DIAGNOSIS — E1122 Type 2 diabetes mellitus with diabetic chronic kidney disease: Secondary | ICD-10-CM | POA: Diagnosis not present

## 2016-01-16 DIAGNOSIS — N183 Chronic kidney disease, stage 3 (moderate): Secondary | ICD-10-CM | POA: Diagnosis not present

## 2016-01-17 DIAGNOSIS — E1122 Type 2 diabetes mellitus with diabetic chronic kidney disease: Secondary | ICD-10-CM | POA: Diagnosis not present

## 2016-01-17 DIAGNOSIS — I69315 Cognitive social or emotional deficit following cerebral infarction: Secondary | ICD-10-CM | POA: Diagnosis not present

## 2016-01-17 DIAGNOSIS — I69351 Hemiplegia and hemiparesis following cerebral infarction affecting right dominant side: Secondary | ICD-10-CM | POA: Diagnosis not present

## 2016-01-17 DIAGNOSIS — N183 Chronic kidney disease, stage 3 (moderate): Secondary | ICD-10-CM | POA: Diagnosis not present

## 2016-01-17 DIAGNOSIS — M6281 Muscle weakness (generalized): Secondary | ICD-10-CM | POA: Diagnosis not present

## 2016-01-17 DIAGNOSIS — I69398 Other sequelae of cerebral infarction: Secondary | ICD-10-CM | POA: Diagnosis not present

## 2016-01-18 ENCOUNTER — Other Ambulatory Visit: Payer: Self-pay | Admitting: *Deleted

## 2016-01-18 ENCOUNTER — Telehealth: Payer: Self-pay

## 2016-01-18 DIAGNOSIS — E1122 Type 2 diabetes mellitus with diabetic chronic kidney disease: Secondary | ICD-10-CM | POA: Diagnosis not present

## 2016-01-18 DIAGNOSIS — I69315 Cognitive social or emotional deficit following cerebral infarction: Secondary | ICD-10-CM | POA: Diagnosis not present

## 2016-01-18 DIAGNOSIS — M6281 Muscle weakness (generalized): Secondary | ICD-10-CM | POA: Diagnosis not present

## 2016-01-18 DIAGNOSIS — I69351 Hemiplegia and hemiparesis following cerebral infarction affecting right dominant side: Secondary | ICD-10-CM | POA: Diagnosis not present

## 2016-01-18 DIAGNOSIS — I69398 Other sequelae of cerebral infarction: Secondary | ICD-10-CM | POA: Diagnosis not present

## 2016-01-18 DIAGNOSIS — N183 Chronic kidney disease, stage 3 (moderate): Secondary | ICD-10-CM | POA: Diagnosis not present

## 2016-01-18 MED ORDER — DILTIAZEM HCL ER COATED BEADS 300 MG PO CP24: 300.0000 mg | ORAL_CAPSULE | Freq: Every day | ORAL | Status: DC

## 2016-01-18 MED ORDER — DONEPEZIL HCL 5 MG PO TABS: 5.0000 mg | ORAL_TABLET | Freq: Every day | ORAL | Status: DC

## 2016-01-18 MED ORDER — FUROSEMIDE 40 MG PO TABS: ORAL_TABLET | ORAL | Status: DC

## 2016-01-18 MED ORDER — GLIMEPIRIDE 2 MG PO TABS: 2.0000 mg | ORAL_TABLET | Freq: Every day | ORAL | Status: DC

## 2016-01-18 MED ORDER — MIRTAZAPINE 15 MG PO TABS: 7.5000 mg | ORAL_TABLET | Freq: Every day | ORAL | Status: DC

## 2016-01-18 MED ORDER — HYDROCHLOROTHIAZIDE 12.5 MG PO CAPS: 12.5000 mg | ORAL_CAPSULE | Freq: Every day | ORAL | Status: DC

## 2016-01-18 MED ORDER — SENNOSIDES-DOCUSATE SODIUM 8.6-50 MG PO TABS: 2.0000 | ORAL_TABLET | Freq: Every day | ORAL | Status: DC

## 2016-01-18 MED ORDER — WARFARIN SODIUM 5 MG PO TABS: 5.0000 mg | ORAL_TABLET | Freq: Every day | ORAL | Status: DC

## 2016-01-18 MED ORDER — LOSARTAN POTASSIUM 50 MG PO TABS: 50.0000 mg | ORAL_TABLET | Freq: Every day | ORAL | Status: DC

## 2016-01-18 MED ORDER — HYDRALAZINE HCL 50 MG PO TABS: 50.0000 mg | ORAL_TABLET | Freq: Three times a day (TID) | ORAL | Status: DC

## 2016-01-18 NOTE — Telephone Encounter (Signed)
Pt's wife called to say pt was in a hurry when he took his am bp med and threw up, he has since taken his pm meds and is doing fine, she is ask to call if he continues to have vomiting or if pt has any problems, she is agreeable

## 2016-01-18 NOTE — Telephone Encounter (Signed)
Pt wife requesting the nurse to call back regarding blood pressure.

## 2016-01-24 DIAGNOSIS — N183 Chronic kidney disease, stage 3 (moderate): Secondary | ICD-10-CM | POA: Diagnosis not present

## 2016-01-24 DIAGNOSIS — E1122 Type 2 diabetes mellitus with diabetic chronic kidney disease: Secondary | ICD-10-CM | POA: Diagnosis not present

## 2016-01-24 DIAGNOSIS — M6281 Muscle weakness (generalized): Secondary | ICD-10-CM | POA: Diagnosis not present

## 2016-01-24 DIAGNOSIS — I69315 Cognitive social or emotional deficit following cerebral infarction: Secondary | ICD-10-CM | POA: Diagnosis not present

## 2016-01-24 DIAGNOSIS — I69351 Hemiplegia and hemiparesis following cerebral infarction affecting right dominant side: Secondary | ICD-10-CM | POA: Diagnosis not present

## 2016-01-24 DIAGNOSIS — I69398 Other sequelae of cerebral infarction: Secondary | ICD-10-CM | POA: Diagnosis not present

## 2016-01-25 ENCOUNTER — Other Ambulatory Visit: Payer: Self-pay

## 2016-01-25 NOTE — Telephone Encounter (Signed)
Pt daughter needs to speak with a nurse regarding pt meds. Please call pt back.

## 2016-01-26 NOTE — Telephone Encounter (Signed)
Daughter wanting to verify diabetes medication. Told her that the patient should be taking th glimepiride 2 mg. Daily with breakfast. Per last office visit and daughter, patient is taking 6.25mg . Of HCTZ by cutting a 12.5 mg. Tab in half. The RX sent in was for capsules. The daughter was wondering if he was increased to 12.5mg . Or if the RX needs to be changed to a tablet. Thanks!

## 2016-01-27 MED ORDER — HYDROCHLOROTHIAZIDE 12.5 MG PO CAPS: 12.5000 mg | ORAL_CAPSULE | Freq: Every day | ORAL | Status: DC

## 2016-01-27 NOTE — Telephone Encounter (Signed)
He can increase to 12.5mg  daily (1 full tablet). He will need to come back to clinic in the next week for a blood pressure recheck and adjustment of his blood pressure meds (would try to decrease the clonidine slowly). If he could have his blood pressure checked at home too, that would be good. Thanks!

## 2016-01-30 ENCOUNTER — Ambulatory Visit (HOSPITAL_COMMUNITY)
Admission: RE | Admit: 2016-01-30 | Discharge: 2016-01-30 | Disposition: A | Discharge status: Home / Self Care | Payer: Medicare Other | Source: Ambulatory Visit | Attending: Internal Medicine | Admitting: Internal Medicine

## 2016-01-30 ENCOUNTER — Telehealth: Payer: Self-pay | Admitting: *Deleted

## 2016-01-30 ENCOUNTER — Ambulatory Visit (INDEPENDENT_AMBULATORY_CARE_PROVIDER_SITE_OTHER): Payer: Medicare Other | Admitting: Internal Medicine

## 2016-01-30 ENCOUNTER — Telehealth: Payer: Self-pay | Admitting: Pharmacist

## 2016-01-30 VITALS — BP 172/88 | HR 64 | Temp 98.2°F | Ht 68.0 in | Wt 184.1 lb

## 2016-01-30 DIAGNOSIS — E1122 Type 2 diabetes mellitus with diabetic chronic kidney disease: Secondary | ICD-10-CM | POA: Diagnosis not present

## 2016-01-30 DIAGNOSIS — W1830XA Fall on same level, unspecified, initial encounter: Secondary | ICD-10-CM

## 2016-01-30 DIAGNOSIS — I10 Essential (primary) hypertension: Secondary | ICD-10-CM

## 2016-01-30 DIAGNOSIS — I69351 Hemiplegia and hemiparesis following cerebral infarction affecting right dominant side: Secondary | ICD-10-CM | POA: Diagnosis not present

## 2016-01-30 DIAGNOSIS — N183 Chronic kidney disease, stage 3 (moderate): Secondary | ICD-10-CM | POA: Diagnosis not present

## 2016-01-30 DIAGNOSIS — Z7901 Long term (current) use of anticoagulants: Secondary | ICD-10-CM

## 2016-01-30 DIAGNOSIS — Z7982 Long term (current) use of aspirin: Secondary | ICD-10-CM | POA: Diagnosis not present

## 2016-01-30 DIAGNOSIS — D181 Lymphangioma, any site: Secondary | ICD-10-CM | POA: Insufficient documentation

## 2016-01-30 DIAGNOSIS — G44309 Post-traumatic headache, unspecified, not intractable: Secondary | ICD-10-CM

## 2016-01-30 DIAGNOSIS — S0990XA Unspecified injury of head, initial encounter: Secondary | ICD-10-CM | POA: Diagnosis not present

## 2016-01-30 DIAGNOSIS — Y92009 Unspecified place in unspecified non-institutional (private) residence as the place of occurrence of the external cause: Secondary | ICD-10-CM | POA: Diagnosis not present

## 2016-01-30 DIAGNOSIS — S0081XA Abrasion of other part of head, initial encounter: Secondary | ICD-10-CM | POA: Diagnosis not present

## 2016-01-30 DIAGNOSIS — M6281 Muscle weakness (generalized): Secondary | ICD-10-CM | POA: Diagnosis not present

## 2016-01-30 DIAGNOSIS — W19XXXA Unspecified fall, initial encounter: Secondary | ICD-10-CM | POA: Insufficient documentation

## 2016-01-30 DIAGNOSIS — I69315 Cognitive social or emotional deficit following cerebral infarction: Secondary | ICD-10-CM | POA: Diagnosis not present

## 2016-01-30 DIAGNOSIS — I69398 Other sequelae of cerebral infarction: Secondary | ICD-10-CM | POA: Diagnosis not present

## 2016-01-30 NOTE — Assessment & Plan Note (Signed)
Pt c/o mild HA following fall on 01/28/16. Resolved overnight, now asymptomatic. Recommend CT Head d/t anticoagulation.

## 2016-01-30 NOTE — Telephone Encounter (Signed)
Kathlee Nations RN from [facility] calls with FS POC INR = 3.0 on 35mg /warfarin per week. No evidence of bleeding symptoms or signs. Will reduce to 32.5mg /wk warfarin (has 5mg  strength tablets: 1/2 tab Q Monday; 1 tablet all other days). Recollect INR in 2 weeks.

## 2016-01-30 NOTE — Progress Notes (Signed)
CC: Mechanical fall from standing HPI: [name] is a 70 y.o. male with a h/o of afib, HTN, and CVA who presents for evaluation following mechanical fall from standing on 01/28/16. He was referred to clinic by his home health nurse today after she learned of his recent fall. His INR was 3.0 and his Coumadin dose was titrated down slightly by [name], RPH. He suffered a small abrasion on his right cheek, but has denied any other injury, including pain or weakness in the extremities, alteration of metal status, changes in mood/affect or memory, and balance. He is currently taking Coumadin and ASA for afib.  He was found to have elevated BP with automated sphygmomanometer on presentation. He reports good compliance with all his medications with the help of his daughter-in-law. He denies excess anxiety today. He denies HA, changes to [place], tachycardia/palpitations, CP, SOB, DOE, or changes in urination. Repeat manual BP showed significant reduction.  Past Medical History  Diagnosis Date  . Hypertension   . CKD (chronic kidney disease), stage II   . CAD (coronary artery disease)   . Depression   . Gout   . Hyperlipidemia   . 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 GERD (gastroesophageal reflux disease)   . Diabetes mellitus type II     Insulin dependent  . Upper GI bleeding 07/01/2013  . Paroxysmal atrial fibrillation (HCC)     on coumadin  . Atrial fibrillation ([place])   . Myocardial infarction [facility]) 2000's    "near heart attack" (07/14/2013)  . Shortness of breath     "can happen at any time" (07/14/2013)  . Sleep apnea 07/2010    "has mask at home; seldom uses it" (07/14/2013)  . Stomach ulcer 1950's    "as a teenager"  . Stroke [facility]) ~ 2007; ~ 2009    "memory not as good since" (07/14/2013)  . Permanent atrial fibrillation ([place]) 07/17/2013  . Chronic anticoagulation, on coumadin 07/17/2013  . Tachy-brady syndrome ([place])  07/17/2013   Current Outpatient Rx  Name  Route  Sig  Dispense  Refill  . acetaminophen (TYLENOL) 325 MG tablet   Oral   Take 2 tablets (650 mg total) by mouth every 6 (six) hours as needed for moderate pain or headache.         Marland Kitchen aspirin 81 MG EC tablet   Oral   Take 81 mg by mouth daily.           . cloNIDine (CATAPRES) 0.3 MG tablet   Oral   Take 1 tablet (0.3 mg total) by mouth 3 (three) times daily. Please resume the morning of 11/23/15.   90 tablet   5   . diclofenac sodium (VOLTAREN) 1 % GEL   Topical   Apply 4 g topically 4 (four) times daily.   1 Tube   3   . diltiazem (CARDIZEM CD) 300 MG 24 hr capsule   Oral   Take 1 capsule (300 mg total) by mouth daily.   90 capsule   1   . docusate sodium (COLACE) 100 MG capsule   Oral   Take 100 mg by mouth 2 (two) times daily.         Marland Kitchen donepezil (ARICEPT) 5 MG tablet   Oral   Take 1 tablet (5 mg total) by mouth at bedtime.   90 tablet   1   . furosemide (LASIX) 40 MG tablet  Take 1/2 tablet daily   45 tablet   1   . glimepiride (AMARYL) 2 MG tablet   Oral   Take 1 tablet (2 mg total) by mouth daily with breakfast.   90 tablet   1   . hydrALAZINE (APRESOLINE) 50 MG tablet   Oral   Take 1 tablet (50 mg total) by mouth every 8 (eight) hours.   270 tablet   1   . hydrochlorothiazide (MICROZIDE) 12.5 MG capsule   Oral   Take 1 capsule (12.5 mg total) by mouth daily.   90 capsule   1   . losartan (COZAAR) 50 MG tablet   Oral   Take 1 tablet (50 mg total) by mouth daily.   90 tablet   1   . mirtazapine (REMERON) 15 MG tablet   Oral   Take 0.5 tablets (7.5 mg total) by mouth at bedtime.   90 tablet   1   . polyethylene glycol (MIRALAX / GLYCOLAX) packet   Oral   Take 17 g by mouth daily.   14 each   0   . senna-docusate (SENOKOT-S) 8.6-50 MG tablet   Oral   Take 2 tablets by mouth at bedtime.   180 tablet   1   . warfarin (COUMADIN) 2.5 MG tablet      Use 5 on Mon, Tue, Wed,  Fri, Sat, Sun.  Use 2.5 mg on Mon and Thurs.         . warfarin (COUMADIN) 5 MG tablet   Oral   Take 1 tablet (5 mg total) by mouth daily.   90 tablet   1    Review of Systems: A complete ROS was negative except as per HPI.  Physical Exam: Filed Vitals:   01/30/16 1458 01/30/16 1536  BP: 201/92 172/88  Pulse: 64   Temp: 98.2 F (36.8 C)   TempSrc: Oral   Height: 5\' 8"  (1.727 m)   Weight: 184 lb 1.6 oz (83.507 kg)   SpO2: 100%    General appearance: alert, cooperative, appears stated age and mildly withdrawn Head: Normocephalic, without obvious abnormality, mild 2cm abrasion on the right cheek, healing with eschar Lungs: clear to auscultation bilaterally Chest wall: no tenderness Heart: irregularly irregular rhythm Neurologic: Mental status: Alert, oriented, thought content appropriate  Cranial nerves: normal  Sensory: normal  Motor: grade 5 biceps bilaterally   grade 5 triceps bilaterally   grade 5 flexor digitorum profundus bilaterally   grade 5 psoas on the left   grade 4 psoas on the left  Coordination: intact  Assessment & Plan:  See encounters tab for problem based medical decision making. Patient seen with Dr. Evette Doffing  Signed: Holley Raring, MD 01/30/2016, 5:13 PM  Pager: [phone]

## 2016-01-30 NOTE — Assessment & Plan Note (Addendum)
Pt had an elevated BP on automated testing (201/92). Given his irregular rhythm, manual testing was repeated after 1 hour with significant reduction (172/88). In light of the polypharmacy needed to control his BP and his h/o occasional transient elevations in BP, I elected to defer management of his BP medications to his PCP. Son reports that med administration is managed by pt's daughter-in-law. Son believes that compliance is good in light of this.

## 2016-01-30 NOTE — Patient Instructions (Addendum)
In the future, please go to the Emergency Department if you have a fall, so we can obtain a picture of your brain to make sure there is no bleeding. You do not have any signs of bleeding in your brain today, but we are concerned because you are taking Coumadin and Aspirin which can increase your risk for bleeding. You may experience subtle symptoms from blood in the brain such as a change in your mental status, becoming more withdrawn and less communicative, you may also experience specific symptoms such as difficulty with speaking, weakness of the arms or legs, or difficulty with walking and balance. If you experience any of these symptoms please go immediately to the Emergency Department. We will schedule a CT scan of your head. The nurses will call you with your appt time. We will also order you a special walker to help prevent falls in the future.  Your blood pressure was high today, but it has come down throughout the visit. We will not make any changes to your medications today, but recommend you tell your primary doctor about your blood pressure readings at home and remember to always take your medications as prescribed.

## 2016-01-30 NOTE — Assessment & Plan Note (Signed)
Mechanical fall from standing on anticoagulation and aspirin antiplatelet therapy. Pt has a small abrasion on his right cheek, but denies any other injuries. His mental status is at baseline per his son and his neuro exam shows no new focal deficits. He has been using his wife's cane for balance recently. I have ordered a rolling walker for balance assistance to help prevent future falls given his risk of serious complications while on anticoagulation therapy. Given his risk of ICH, we have schedule CT Head w/o contrast. I will f/u with imaging results. Otherwise, f/u with PCP or PRN for worsening symptoms.

## 2016-01-30 NOTE — Telephone Encounter (Signed)
Patrick [name] with PT reports that patient had a fall in the home on Saturday and hit his cheek bone. No reports of serious injury per patient. No ED visit. Reports a BP today of 180/100 at rest. Patient denies headache or weakness. Scheduled patient for ACC at 2:15 for evaluation.

## 2016-02-01 ENCOUNTER — Telehealth: Payer: Self-pay | Admitting: Internal Medicine

## 2016-02-01 DIAGNOSIS — Y92009 Unspecified place in unspecified non-institutional (private) residence as the place of occurrence of the external cause: Principal | ICD-10-CM

## 2016-02-01 DIAGNOSIS — W19XXXA Unspecified fall, initial encounter: Secondary | ICD-10-CM

## 2016-02-01 NOTE — Telephone Encounter (Signed)
S/w wife and notified of CT Head results which demonstrate recent sub-dural bleeding, possibly related to fall on 01/28/16. Will d/c ASA but contniue Coumadin until f/u with Neurology on 02/10/16 as previously scheduled.

## 2016-02-01 NOTE — Progress Notes (Signed)
Internal Medicine Clinic Attending  I saw and evaluated the patient.  I personally confirmed the key portions of the history and exam documented by Dr. Gay Filler and I reviewed pertinent patient test results.  The assessment, diagnosis, and plan were formulated together and I agree with the documentation in the resident's note.  70 year old man came to clinic after a fall at home two days ago. He is on dual antithrombotic with coumadin and aspirin. Neuro exam was stable, no facial fractures, he had no complaints. We obtained a stat head CT which returned with a stable appearing hygroma and subtle changes that could be a very small amount of blood around the right frontal lobe. Dr. Gay Filler called the results to the patient who is still doing fine at home with no complaints. At this point I don't think there is any surgical intervention necessary. We advised to stop aspirin as it likely is only increasing his risk of bleeding without much benefit. Continue coumadin because he has a very high risk for recurrent stroke. Follow up with [name] and neurology closely, and we ordered him more assistive devices and continued home PT for fall prevention.

## 2016-02-01 NOTE — Telephone Encounter (Signed)
Thank you. I agree 

## 2016-02-01 NOTE — Telephone Encounter (Signed)
Spoke with wife who verbalized the change to full capsules and need for recheck of blood pressure in one week. Added to [facility] schedule for next Wednesday.

## 2016-02-08 ENCOUNTER — Ambulatory Visit (INDEPENDENT_AMBULATORY_CARE_PROVIDER_SITE_OTHER): Payer: Medicare Other | Admitting: Internal Medicine

## 2016-02-08 ENCOUNTER — Encounter: Payer: Self-pay | Admitting: Internal Medicine

## 2016-02-08 VITALS — BP 161/86 | HR 72 | Temp 98.1°F | Ht 68.5 in | Wt 181.4 lb

## 2016-02-08 DIAGNOSIS — Z79899 Other long term (current) drug therapy: Secondary | ICD-10-CM

## 2016-02-08 DIAGNOSIS — Z87891 Personal history of nicotine dependence: Secondary | ICD-10-CM | POA: Diagnosis not present

## 2016-02-08 DIAGNOSIS — I10 Essential (primary) hypertension: Secondary | ICD-10-CM | POA: Diagnosis not present

## 2016-02-08 MED ORDER — HYDROCHLOROTHIAZIDE 25 MG PO TABS: 25.0000 mg | ORAL_TABLET | Freq: Every day | ORAL | Status: DC

## 2016-02-08 NOTE — Assessment & Plan Note (Addendum)
Patient here for BP follow-up. Currently he is on 5 medications for his hypertension. Currently he takes clonidine .3mg  TID, furosemide 20mg  QD, HCTZ 12.5mg  QD, Losartan 50mg  QD and hydralazine 50mg  TID. Patients blood pressure continues to improve but is still not at goal. At his visit today his BP was 161/86. This hypertension represents refractory hypertension. Given his current regimen including a centrally acting anti-hypertensive, 2 dieretics an ARB and a direct vasodilator we would hope that his blood pressure would be more adequately controlled. Recently he had a workup for secondary causes but this did not yield the cause of his refractory hypertension. Given his significant co-morbidities and current regimen I think it is most appropriate to increase his HCTZ before adding on a 6th antihypertensive medication. We considered an aldosterone blocker such as spironolactone but due to renal function and co-morbidities we will only increase his HCTZ at this time.  -- Increase HCTZ to 25mg  once daily -- Continue other antihypertensives as prescribed -- Consider adding spironolactone at his next visit of HTN still not at goal  -- BMET today for electrolyte and renal monitoring -- Continue to follow low sodium diet -- Follow-up in 1 month for BP and medication check.

## 2016-02-08 NOTE — Progress Notes (Signed)
Internal Medicine Clinic Attending  I saw and evaluated the patient.  I personally confirmed the key portions of the history and exam documented by Dr. Taylor and I reviewed pertinent patient test results.  The assessment, diagnosis, and plan were formulated together and I agree with the documentation in the resident's note.  

## 2016-02-08 NOTE — Progress Notes (Signed)
CC: HTN medication follow-up HPI: [name] is a 70 y.o. male with a h/o of DM, refractory HTN, A-fib on anticoagulation, CKD and OSA who presents for follow-up of blood pressure medication changes. He is on a 5 medication regimen for refractory HTN and was recently started on 12.5mg  of HCTZ. He has no acute complaints or concerns at today's visit. He denies headaches, shortness of breath, chest pain, n/v and abdominal pain, or changes with his bowel or bladder habits. He also denies any fevers, chills, or night sweats.     Past Medical History  Diagnosis Date  . Hypertension   . CKD (chronic kidney disease), stage II   . CAD (coronary artery disease)   . Depression   . Gout   . Hyperlipidemia   . 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 GERD (gastroesophageal reflux disease)   . Diabetes mellitus type II     Insulin dependent  . Upper GI bleeding 07/01/2013  . Paroxysmal atrial fibrillation (HCC)     on coumadin  . Atrial fibrillation ([place])   . Myocardial infarction [facility]) 2000's    "near heart attack" (07/14/2013)  . Shortness of breath     "can happen at any time" (07/14/2013)  . Sleep apnea 07/2010    "has mask at home; seldom uses it" (07/14/2013)  . Stomach ulcer 1950's    "as a teenager"  . Stroke [facility]) ~ 2007; ~ 2009    "memory not as good since" (07/14/2013)  . Permanent atrial fibrillation ([place]) 07/17/2013  . Chronic anticoagulation, on coumadin 07/17/2013  . Tachy-brady syndrome ([place]) 07/17/2013   Current Outpatient Rx  Name  Route  Sig  Dispense  Refill  . acetaminophen (TYLENOL) 325 MG tablet   Oral   Take 2 tablets (650 mg total) by mouth every 6 (six) hours as needed for moderate pain or headache.         . cloNIDine (CATAPRES) 0.3 MG tablet   Oral   Take 1 tablet (0.3 mg total) by mouth 3 (three) times daily. Please resume the morning of 11/23/15.   90 tablet   5   . diclofenac sodium  (VOLTAREN) 1 % GEL   Topical   Apply 4 g topically 4 (four) times daily.   1 Tube   3   . diltiazem (CARDIZEM CD) 300 MG 24 hr capsule   Oral   Take 1 capsule (300 mg total) by mouth daily.   90 capsule   1   . docusate sodium (COLACE) 100 MG capsule   Oral   Take 100 mg by mouth 2 (two) times daily.         Marland Kitchen donepezil (ARICEPT) 5 MG tablet   Oral   Take 1 tablet (5 mg total) by mouth at bedtime.   90 tablet   1   . furosemide (LASIX) 40 MG tablet      Take 1/2 tablet daily   45 tablet   1   . glimepiride (AMARYL) 2 MG tablet   Oral   Take 1 tablet (2 mg total) by mouth daily with breakfast.   90 tablet   1   . hydrALAZINE (APRESOLINE) 50 MG tablet   Oral   Take 1 tablet (50 mg total) by mouth every 8 (eight) hours.   270 tablet   1   . hydrochlorothiazide (HYDRODIURIL) 25 MG tablet   Oral   Take 1  tablet (25 mg total) by mouth daily.   30 tablet   1   . losartan (COZAAR) 50 MG tablet   Oral   Take 1 tablet (50 mg total) by mouth daily.   90 tablet   1   . mirtazapine (REMERON) 15 MG tablet   Oral   Take 0.5 tablets (7.5 mg total) by mouth at bedtime.   90 tablet   1   . polyethylene glycol (MIRALAX / GLYCOLAX) packet   Oral   Take 17 g by mouth daily.   14 each   0   . senna-docusate (SENOKOT-S) 8.6-50 MG tablet   Oral   Take 2 tablets by mouth at bedtime.   180 tablet   1   . warfarin (COUMADIN) 2.5 MG tablet      Use 5 on Mon, Tue, Wed, Fri, Sat, Sun.  Use 2.5 mg on Mon and Thurs.         . warfarin (COUMADIN) 5 MG tablet   Oral   Take 1 tablet (5 mg total) by mouth daily.   90 tablet   1      Review of Systems: A complete ROS was negative except as per HPI.  Physical Exam: Filed Vitals:   02/08/16 1421  BP: 161/86  Pulse: 72  Temp: 98.1 F (36.7 C)  TempSrc: Oral  SpO2: 100%   BP 161/86 mmHg  Pulse 72  Temp(Src) 98.1 F (36.7 C) (Oral)  Ht 5' 8.5" (1.74 m)  Wt 181 lb 6.4 oz (82.283 kg)  BMI 27.18 kg/m2   SpO2 100% General appearance: alert, cooperative and no distress Head: Normocephalic, without obvious abnormality, atraumatic Lungs: clear to auscultation bilaterally Heart: regular rate and rhythm, S1, S2 normal, no murmur, click, rub or gallop Abdomen: soft, non-tender; bowel sounds normal; no masses,  no organomegaly Extremities: extremities normal, atraumatic, no cyanosis or edema  Assessment & Plan:  See encounters tab for problem based medical decision making. Patient seen with Dr. Angelia Mould  Signed: Ophelia Shoulder, MD 02/08/2016, 3:22 PM  Pager: [phone]

## 2016-02-08 NOTE — Patient Instructions (Addendum)
It was a pleasure seeing you today. Thank you for choosing [name] for your healthcare needs.  -- Will start HCTZ (hyrdochlorothiazide) 25mg  once daily -- Will check basic blood work today -- Will see you in 1 month or sooner as needed   Low-Sodium Eating Plan Sodium raises blood pressure and causes water to be held in the body. Getting less sodium from food will help lower your blood pressure, reduce any swelling, and protect your heart, liver, and kidneys. We get sodium by adding salt (sodium chloride) to food. Most of our sodium comes from canned, boxed, and frozen foods. Restaurant foods, fast foods, and pizza are also very high in sodium. Even if you take medicine to lower your blood pressure or to reduce fluid in your body, getting less sodium from your food is important. WHAT IS MY PLAN? Most people should limit their sodium intake to 2,300 mg a day. Your health care provider recommends that you limit your sodium intake to __________ a day.  WHAT DO I NEED TO KNOW ABOUT THIS EATING PLAN? For the low-sodium eating plan, you will follow these general guidelines:  Choose foods with a % Daily Value for sodium of less than 5% (as listed on the food label).   Use salt-free seasonings or herbs instead of table salt or sea salt.   Check with your health care provider or pharmacist before using salt substitutes.   Eat fresh foods.  Eat more vegetables and fruits.  Limit canned vegetables. If you do use them, rinse them well to decrease the sodium.   Limit cheese to 1 oz (28 g) per day.   Eat lower-sodium products, often labeled as "lower sodium" or "no salt added."  Avoid foods that contain monosodium glutamate (MSG). MSG is sometimes added to [place] food and some canned foods.  Check food labels (Nutrition Facts labels) on foods to learn how much sodium is in one serving.  Eat more home-cooked food and less restaurant, buffet, and fast food.  When eating at a  restaurant, ask that your food be prepared with less salt, or no salt if possible.  HOW DO I READ FOOD LABELS FOR SODIUM INFORMATION? The Nutrition Facts label lists the amount of sodium in one serving of the food. If you eat more than one serving, you must multiply the listed amount of sodium by the number of servings. Food labels may also identify foods as:  Sodium free--Less than 5 mg in a serving.  Very low sodium--35 mg or less in a serving.  Low sodium--140 mg or less in a serving.  Light in sodium--50% less sodium in a serving. For example, if a food that usually has 300 mg of sodium is changed to become light in sodium, it will have 150 mg of sodium.  Reduced sodium--25% less sodium in a serving. For example, if a food that usually has 400 mg of sodium is changed to reduced sodium, it will have 300 mg of sodium. WHAT FOODS CAN I EAT? Grains Low-sodium cereals, including oats, puffed wheat and rice, and shredded wheat cereals. Low-sodium crackers. Unsalted rice and pasta. Lower-sodium bread.  Vegetables Frozen or fresh vegetables. Low-sodium or reduced-sodium canned vegetables. Low-sodium or reduced-sodium tomato sauce and paste. Low-sodium or reduced-sodium tomato and vegetable juices.  Fruits Fresh, frozen, and canned fruit. Fruit juice.  Meat and Other Protein Products Low-sodium canned tuna and salmon. Fresh or frozen meat, poultry, seafood, and fish. Lamb. Unsalted nuts. Dried beans, peas, and lentils without added  salt. Unsalted canned beans. Homemade soups without salt. Eggs.  Dairy Milk. Soy milk. Ricotta cheese. Low-sodium or reduced-sodium cheeses. Yogurt.  Condiments Fresh and dried herbs and spices. Salt-free seasonings. Onion and garlic powders. Low-sodium varieties of mustard and ketchup. Fresh or refrigerated horseradish. Lemon juice.  Fats and Oils Reduced-sodium salad dressings. Unsalted butter.  Other Unsalted popcorn and pretzels.  The items listed  above may not be a complete list of recommended foods or beverages. Contact your dietitian for more options. WHAT FOODS ARE NOT RECOMMENDED? Grains Instant hot cereals. Bread stuffing, pancake, and biscuit mixes. Croutons. Seasoned rice or pasta mixes. Noodle soup cups. Boxed or frozen macaroni and cheese. Self-rising flour. Regular salted crackers. Vegetables Regular canned vegetables. Regular canned tomato sauce and paste. Regular tomato and vegetable juices. Frozen vegetables in sauces. Salted [place] fries. Olives. Angie Fava. Relishes. Sauerkraut. Salsa. Meat and Other Protein Products Salted, canned, smoked, spiced, or pickled meats, seafood, or fish. Bacon, ham, sausage, hot dogs, corned beef, chipped beef, and packaged luncheon meats. Salt pork. Jerky. Pickled herring. Anchovies, regular canned tuna, and sardines. Salted nuts. Dairy Processed cheese and cheese spreads. Cheese curds. Blue cheese and cottage cheese. Buttermilk.  Condiments Onion and garlic salt, seasoned salt, table salt, and sea salt. Canned and packaged gravies. Worcestershire sauce. Tartar sauce. Barbecue sauce. Teriyaki sauce. Soy sauce, including reduced sodium. Steak sauce. Fish sauce. Oyster sauce. Cocktail sauce. Horseradish that you find on the shelf. Regular ketchup and mustard. Meat flavorings and tenderizers. Bouillon cubes. Hot sauce. Tabasco sauce. Marinades. Taco seasonings. Relishes. Fats and Oils Regular salad dressings. Salted butter. Margarine. Ghee. Bacon fat.  Other Potato and tortilla chips. Corn chips and puffs. Salted popcorn and pretzels. Canned or dried soups. Pizza. Frozen entrees and pot pies.  The items listed above may not be a complete list of foods and beverages to avoid. Contact your dietitian for more information.   This information is not intended to replace advice given to you by your health care provider. Make sure you discuss any questions you have with your health care provider.    Document Released: 01/05/2002 Document Revised: 08/06/2014 Document Reviewed: 05/20/2013 Elsevier Interactive Patient Education [employer].

## 2016-02-09 LAB — BMP8+ANION GAP
Anion Gap: 18 mmol/L (ref 10.0–18.0)
BUN/Creatinine Ratio: 17 (ref 10–24)
BUN: 49 mg/dL — ABNORMAL HIGH (ref 8–27)
CO2: 25 mmol/L (ref 18–29)
Calcium: 9.2 mg/dL (ref 8.6–10.2)
Chloride: 101 mmol/L (ref 96–106)
Creatinine, Ser: 2.91 mg/dL — ABNORMAL HIGH (ref 0.76–1.27)
GFR calc Af Amer: 24 mL/min/{1.73_m2} — ABNORMAL LOW (ref >59)
GFR calc non Af Amer: 21 mL/min/{1.73_m2} — ABNORMAL LOW (ref >59)
Glucose: 128 mg/dL — ABNORMAL HIGH (ref 65–99)
Potassium: 3.6 mmol/L (ref 3.5–5.2)
Sodium: 144 mmol/L (ref 134–144)

## 2016-02-10 ENCOUNTER — Encounter: Payer: Self-pay | Admitting: Neurology

## 2016-02-10 ENCOUNTER — Ambulatory Visit (INDEPENDENT_AMBULATORY_CARE_PROVIDER_SITE_OTHER): Payer: Medicare Other | Admitting: Neurology

## 2016-02-10 VITALS — BP 156/88 | HR 74 | Ht 68.5 in | Wt 181.4 lb

## 2016-02-10 DIAGNOSIS — E1159 Type 2 diabetes mellitus with other circulatory complications: Secondary | ICD-10-CM | POA: Diagnosis not present

## 2016-02-10 DIAGNOSIS — E785 Hyperlipidemia, unspecified: Secondary | ICD-10-CM

## 2016-02-10 DIAGNOSIS — R69 Illness, unspecified: Secondary | ICD-10-CM

## 2016-02-10 DIAGNOSIS — R6889 Other general symptoms and signs: Secondary | ICD-10-CM

## 2016-02-10 DIAGNOSIS — I63423 Cerebral infarction due to embolism of bilateral anterior cerebral arteries: Secondary | ICD-10-CM

## 2016-02-10 DIAGNOSIS — N184 Chronic kidney disease, stage 4 (severe): Secondary | ICD-10-CM

## 2016-02-10 DIAGNOSIS — I639 Cerebral infarction, unspecified: Secondary | ICD-10-CM

## 2016-02-10 DIAGNOSIS — I10 Essential (primary) hypertension: Secondary | ICD-10-CM | POA: Diagnosis not present

## 2016-02-10 NOTE — Patient Instructions (Addendum)
-   continue coumadin for stroke prevention, INR 2-3 - would like to continue pravastatin for stroke prevention but you would like to discuss with your PCP - we recommend outpt physical therapy but you prefer home exercise  - check BP and glucose at home and record - Follow up with your primary care physician for stroke risk factor modification. Recommend maintain blood pressure goal <130/80, diabetes with hemoglobin A1c goal below 7.0% and lipids with LDL cholesterol goal below 70 mg/dL.  - follow up in 4 months.

## 2016-02-11 DIAGNOSIS — R6889 Other general symptoms and signs: Secondary | ICD-10-CM | POA: Insufficient documentation

## 2016-02-11 DIAGNOSIS — E785 Hyperlipidemia, unspecified: Secondary | ICD-10-CM | POA: Insufficient documentation

## 2016-02-11 DIAGNOSIS — I63423 Cerebral infarction due to embolism of bilateral anterior cerebral arteries: Secondary | ICD-10-CM | POA: Insufficient documentation

## 2016-02-11 DIAGNOSIS — N184 Chronic kidney disease, stage 4 (severe): Secondary | ICD-10-CM | POA: Insufficient documentation

## 2016-02-11 DIAGNOSIS — E1159 Type 2 diabetes mellitus with other circulatory complications: Secondary | ICD-10-CM | POA: Insufficient documentation

## 2016-02-11 NOTE — Progress Notes (Signed)
STROKE NEUROLOGY FOLLOW UP NOTE  NAME: Patrick [name] DOB: 1946/02/06  REASON FOR VISIT: stroke follow up HISTORY FROM: pt and wife and chart  Today we had the pleasure of seeing Patrick [name] in follow-up at our Neurology Clinic. Pt was accompanied by wife.   History Summary Patrick [name] is a 70 y.o. male with history of HTN, DM, HLD, CKD, CAD with MI, left thalamic stroke in 10/2000, GIB, afib on coumadin and OSA was admitted on 11/18/15 for severe hypertension and headache. Put on cardene drip and later BP controlled with po BP meds. Initial MRI showed no acute infarcts. INR on admission 1.97. However, developed right UE and LE weakness. Repeat MRI showed scattered multiple punctate infarcts at b/l ACA territory. CUS negative. EF 40-45%. MRA not done as it likely would not change treatment plan. LDL 65 and A1C 8.5. He was continued on coumadin and repeat INR 3.2. He was discharged to CIR with coumadin and pravastatin.   Interval History During the interval time, the patient has been doing well. He stayed in CIR for 3 weeks and then went to SNF for 3 weeks before going home with home PT/OT. His last INR 2.5 in May per record. He finished home PT/OT last week. His right UE and LE weakness much improved and currently right arm near baseline. He did not check BP at home. Today 156/88 and he just recently started on HCTZ 25mg . During the visit, pt continued to have abulia as he had in hospital, paucity of speech and emotion. He refused outpt PT/OT and prefer home exercise.   REVIEW OF SYSTEMS: Full 14 system review of systems performed and notable only for those listed below and in HPI above, all others are negative:  Constitutional:   Cardiovascular: leg swelling Ear/Nose/Throat:   Skin:  Eyes:   Respiratory:   Gastroitestinal:   Genitourinary: difficulty urination, and painful urination Hematology/Lymphatic:  Bleeding easily Endocrine:  Musculoskeletal:  Walking  difficulty Allergy/Immunology:   Neurological:  Memory loss Psychiatric:  Sleep:   The following represents the patient's updated allergies and side effects list: No Known Allergies  The neurologically relevant items on the patient's problem list were reviewed on today's visit.  Neurologic Examination  A problem focused neurological exam (12 or more points of the single system neurologic examination, vital signs counts as 1 point, cranial nerves count for 8 points) was performed.  Blood pressure 156/88, pulse 74, height 5' 8.5" (1.74 m), weight 181 lb 6.4 oz (82.283 kg).  General - Well nourished, well developed, in no apparent distress, but abulia.  Ophthalmologic - Fundi not visualized due to noncooperation.  Cardiovascular - irregularly irregular heart rate and rhythm  Mental Status -  Level of arousal and orientation to year, place, and person were intact, but not to month.  Language including expression, naming, repetition, comprehension was assessed and found intact. However, significant abulia, paucity of speech, slow in response, lack of emotional reaction. Fund of Knowledge was assessed and was impaired.  Cranial Nerves II - XII - II - Visual field intact OU. III, IV, VI - Extraocular movements intact. V - Facial sensation intact bilaterally. VII - right facial droop. VIII - Hearing & vestibular intact bilaterally. X - Palate elevates symmetrically. XI - Chin turning & shoulder shrug intact bilaterally. XII - Tongue protrusion intact.  Motor Strength - The patient's strength was normal in all extremities except RLE proximal 3/5 and distal 4/5 and pronator drift was absent.  Bulk was normal  and fasciculations were absent.   Motor Tone - Muscle tone was assessed at the neck and appendages and was normal.  Reflexes - The patient's reflexes were 1+ in all extremities and he had no pathological reflexes.  Sensory - Light touch, temperature/pinprick were assessed and  were normal.    Coordination - The patient had normal movements in the hands with no ataxia or dysmetria.  Tremor was absent.  Gait and Station - get up from chair with difficulty, walk with cane, mild right hemiparetic gait.   Functional score  mRS = 3   0 - No symptoms.   1 - No significant disability. Able to carry out all usual activities, despite some symptoms.   2 - Slight disability. Able to look after own affairs without assistance, but unable to carry out all previous activities.   3 - Moderate disability. Requires some help, but able to walk unassisted.   4 - Moderately severe disability. Unable to attend to own bodily needs without assistance, and unable to walk unassisted.   5 - Severe disability. Requires constant nursing care and attention, bedridden, incontinent.   6 - Dead.   NIH Stroke Scale   Level Of Consciousness 0=Alert; keenly responsive 1=Not alert, but arousable by minor stimulation 2=Not alert, requires repeated stimulation 3=Responds only with reflex movements 0  LOC Questions to Month and Age 45=Answers both questions correctly 1=Answers one question correctly 2=Answers neither question correctly 1  LOC Commands      -Open/Close eyes     -Open/close grip 0=Performs both tasks correctly 1=Performs one task correctly 2=Performs neighter task correctly 0  Best Gaze 0=Normal 1=Partial gaze palsy 2=Forced deviation, or total gaze paresis 0  Visual 0=No visual loss 1=Partial hemianopia 2=Complete hemianopia 3=Bilateral hemianopia (blind including cortical blindness) 0  Facial Palsy 0=Normal symmetrical movement 1=Minor paralysis (asymmetry) 2=Partial paralysis (lower face) 3=Complete paralysis (upper and lower face) 1  Motor  0=No drift, limb holds posture for full 10 seconds 1=Drift, limb holds posture, no drift to bed 2=Some antigravity effort, cannot maintain posture, drifts to bed 3=No effort against gravity, limb falls 4=No movement  Right Arm 0     Leg 1    Left Arm 0     Leg 0  Limb Ataxia 0=Absent 1=Present in one limb 2=Present in two limbs 0  Sensory 0=Normal 1=Mild to moderate sensory loss 2=Severe to total sensory loss 0  Best Language 0=No aphasia, normal 1=Mild to moderate aphasia 2=Mute, global aphasia 3=Mute, global aphasia 0  Dysarthria 0=Normal 1=Mild to moderate 2=Severe, unintelligible or mute/anarthric 0  Extinction/Neglect 0=No abnormality 1=Extinction to bilateral simultaneous stimulation 2=Profound neglect 0  Total   3     Data reviewed: I personally reviewed the images and agree with the radiology interpretations.  Ct Head Wo Contrast 11/18/2015  1. Stable, prominent CSF spaces bilaterally at the convexities.  2. Chronic lacunar infarct of the left thalamus.  3. No evidence for acute intracranial abnormality.   Mr Brain Wo Contrast 11/18/2015  1. No acute intracranial abnormality.  2. Chronic ischemic disease with mild progression in the posterior right MCA territory since 2012.  3. Chronic increased subarachnoid space CSF over both cerebral convexities. There may now be tiny superimposed subdural hygromas, but significance is doubtful.  CUS  11/20/2015 Preliminary findings: Bilateral: No significant (1-39%) ICA stenosis. Antegrade vertebral flow.   TTE  11/20/2015 Study Conclusions - Left ventricle: The cavity size was normal. Wall thickness was  increased in a pattern of moderate  to severe LVH. Systolic  function was mildly to moderately reduced. The estimated ejection  fraction was in the range of 40% to 45%. Wall motion was normal;  there were no regional wall motion abnormalities. - Aortic valve: There was mild regurgitation. - Mitral valve: Calcified annulus. - Left atrium: The atrium was moderately dilated.  Mr Brain Wo Contrast  11/21/2015 IMPRESSION: Interval development of scattered small nonhemorrhagic infarcts medial aspect of the posterior  frontal and parietal lobes bilaterally. Remainder of findings similar to the recent exam including: Remote left thalamic infarct. Blood breakdown products right cerebellum and posterior right temporal -occipital junction suggestive of result of prior hemorrhagic insult. Mild chronic microvascular changes. Global atrophy. Prominent extra-axial spaces over the convexities. Although unchanged from the recent examination, slight increase in size when compared to 2012. Inward and slight downward displacement of adjacent brain parenchyma. 3.8 mm midline shift to the left. This may represent the presence of bilateral subdural hygromas. Major intracranial vascular structures are patent. Superimposed atherosclerotic changes of the vertebral arteries suspected. This is more notable involving the right vertebral artery.  Component     Latest Ref Rng 11/20/2015 11/27/2015  Cholesterol     0 - 200 mg/dL 128   Triglycerides     <150 mg/dL 77   HDL Cholesterol     >40 mg/dL 48   Total CHOL/HDL Ratio      2.7   VLDL     0 - 40 mg/dL 15   LDL (calc)     0 - 99 mg/dL 65   Hemoglobin A1C     4.8 - 5.6 %  8.5 (H)  Mean Plasma Glucose       197    Assessment: As you may recall, he is a 70 y.o. African American male with PMH of HTN, DM, HLD, CKD, CAD with MI, left thalamic stroke in 10/2000, GIB, afib on coumadin and OSA was admitted on 11/18/15 for severe hypertension and headache. Put on cardene drip and later BP controlled with po BP meds. Initial MRI showed no acute infarcts. INR on admission 1.97. However, developed right UE and LE weakness. Repeat MRI showed scattered multiple punctate infarcts at b/l ACA territory. CUS negative. EF 40-45%. MRA not done as it likely would not change treatment plan. LDL 65 and A1C 8.5. He was continued on coumadin and repeat INR 3.2. He was discharged to CIR with coumadin and pravastatin. During the interval time, the patient has been doing well. Right UE and LE weakness much  improved and currently right arm near baseline, and able to walk with cane. Pt continued to have abulia. He finished home PT/OT but refused outpt PT/OT and prefer home exercise. Off pravastatin due to unclear reason, but pt would like to discuss with PCP before resumption.   Plan:  - continue coumadin for stroke prevention, INR 2-3 - would like to continue pravastatin for stroke prevention but pt would like to discuss with your PCP first - we recommend outpt physical therapy but pt prefer home exercise  - check BP and glucose at home and record - Follow up with your primary care physician for stroke risk factor modification. Recommend maintain blood pressure goal <130/80, diabetes with hemoglobin A1c goal below 7.0% and lipids with LDL cholesterol goal below 70 mg/dL.  - follow up in 4 months.   I spent more than 25 minutes of face to face time with the patient. Greater than 50% of time was spent in counseling and coordination  of care. We discussed stroke prevention, BP and glucose monitoring, recommended PT/OT and statin.    No orders of the defined types were placed in this encounter.    No orders of the defined types were placed in this encounter.    Patient Instructions  - continue coumadin for stroke prevention, INR 2-3 - would like to continue pravastatin for stroke prevention but you would like to discuss with your PCP - we recommend outpt physical therapy but you prefer home exercise  - check BP and glucose at home and record - Follow up with your primary care physician for stroke risk factor modification. Recommend maintain blood pressure goal <130/80, diabetes with hemoglobin A1c goal below 7.0% and lipids with LDL cholesterol goal below 70 mg/dL.  - follow up in 4 months.     Rosalin Hawking, MD PhD [facility] [address] [place], [place] [address] [phone]

## 2016-02-14 ENCOUNTER — Telehealth: Payer: Self-pay | Admitting: Pharmacist

## 2016-02-14 DIAGNOSIS — I69398 Other sequelae of cerebral infarction: Secondary | ICD-10-CM | POA: Diagnosis not present

## 2016-02-14 DIAGNOSIS — N183 Chronic kidney disease, stage 3 (moderate): Secondary | ICD-10-CM | POA: Diagnosis not present

## 2016-02-14 DIAGNOSIS — I69315 Cognitive social or emotional deficit following cerebral infarction: Secondary | ICD-10-CM | POA: Diagnosis not present

## 2016-02-14 DIAGNOSIS — I69351 Hemiplegia and hemiparesis following cerebral infarction affecting right dominant side: Secondary | ICD-10-CM | POA: Diagnosis not present

## 2016-02-14 DIAGNOSIS — M6281 Muscle weakness (generalized): Secondary | ICD-10-CM | POA: Diagnosis not present

## 2016-02-14 DIAGNOSIS — E1122 Type 2 diabetes mellitus with diabetic chronic kidney disease: Secondary | ICD-10-CM | POA: Diagnosis not present

## 2016-02-14 MED ORDER — WARFARIN SODIUM 5 MG PO TABS: ORAL_TABLET | ORAL | Status: DC

## 2016-02-14 NOTE — Telephone Encounter (Signed)
Roberts Gaudy RN with [facility] calls from patient's home--having performed a POC FS PT/INR = 2.8 on 32.5mg  warfarin/week (1/2 x 5mg  Q Monday; 5mg  all other days--has 5mg  strength tablets). No bleeding complications. No embolic signs/symptoms. Continue same regimen, re-collect at next scheduled visit you have with patient (Monday 31-JUL-17) and call or text results to me at [phone].

## 2016-02-27 ENCOUNTER — Telehealth: Payer: Self-pay | Admitting: *Deleted

## 2016-02-27 DIAGNOSIS — I69398 Other sequelae of cerebral infarction: Secondary | ICD-10-CM | POA: Diagnosis not present

## 2016-02-27 DIAGNOSIS — E1122 Type 2 diabetes mellitus with diabetic chronic kidney disease: Secondary | ICD-10-CM | POA: Diagnosis not present

## 2016-02-27 DIAGNOSIS — N183 Chronic kidney disease, stage 3 (moderate): Secondary | ICD-10-CM | POA: Diagnosis not present

## 2016-02-27 DIAGNOSIS — I69351 Hemiplegia and hemiparesis following cerebral infarction affecting right dominant side: Secondary | ICD-10-CM | POA: Diagnosis not present

## 2016-02-27 DIAGNOSIS — I69315 Cognitive social or emotional deficit following cerebral infarction: Secondary | ICD-10-CM | POA: Diagnosis not present

## 2016-02-27 DIAGNOSIS — M6281 Muscle weakness (generalized): Secondary | ICD-10-CM | POA: Diagnosis not present

## 2016-02-27 NOTE — Telephone Encounter (Signed)
INR 2.5

## 2016-02-28 ENCOUNTER — Telehealth: Payer: Self-pay | Admitting: Pharmacist

## 2016-02-28 NOTE — Telephone Encounter (Signed)
Spoke with patient's wife and reiterated instructions conveyed by [facility] RN yesterday. She acknowledges having been instructed to provide warfarin as follows: 5mg  all days of week except on Mondays--give 1/2 x 5mg  (2.5mg ) Q Monday. Repeat INR on 03/26/16@2PM 

## 2016-02-28 NOTE — Telephone Encounter (Signed)
[facility] RN calls at 5:20PM 31-JUL-17 having visited pt. earlier in day. INR on 30mg  warfarin/wk = 2.5. Advised her to instruct patient to remain on same regimen Su-5mg M-2.5mg T-5mg W-5mg Th-5mg F-5mg Sa-5mg , Repeat INR in 4 weeks.

## 2016-03-15 ENCOUNTER — Encounter: Payer: Medicare Other | Admitting: Pulmonary Disease

## 2016-03-26 ENCOUNTER — Encounter: Payer: Self-pay | Admitting: Internal Medicine

## 2016-03-26 ENCOUNTER — Ambulatory Visit (INDEPENDENT_AMBULATORY_CARE_PROVIDER_SITE_OTHER): Payer: Medicare Other | Admitting: Pharmacist

## 2016-03-26 ENCOUNTER — Ambulatory Visit (INDEPENDENT_AMBULATORY_CARE_PROVIDER_SITE_OTHER): Payer: Medicare Other | Admitting: Internal Medicine

## 2016-03-26 VITALS — BP 169/84 | HR 70 | Temp 98.0°F | Ht 68.0 in | Wt 182.0 lb

## 2016-03-26 DIAGNOSIS — Z8673 Personal history of transient ischemic attack (TIA), and cerebral infarction without residual deficits: Secondary | ICD-10-CM | POA: Diagnosis not present

## 2016-03-26 DIAGNOSIS — Z7984 Long term (current) use of oral hypoglycemic drugs: Secondary | ICD-10-CM | POA: Diagnosis not present

## 2016-03-26 DIAGNOSIS — Z87891 Personal history of nicotine dependence: Secondary | ICD-10-CM | POA: Diagnosis not present

## 2016-03-26 DIAGNOSIS — I10 Essential (primary) hypertension: Secondary | ICD-10-CM | POA: Diagnosis not present

## 2016-03-26 DIAGNOSIS — Z7901 Long term (current) use of anticoagulants: Secondary | ICD-10-CM

## 2016-03-26 DIAGNOSIS — I482 Chronic atrial fibrillation: Secondary | ICD-10-CM | POA: Diagnosis not present

## 2016-03-26 DIAGNOSIS — I4821 Permanent atrial fibrillation: Secondary | ICD-10-CM

## 2016-03-26 DIAGNOSIS — Z79899 Other long term (current) drug therapy: Secondary | ICD-10-CM

## 2016-03-26 DIAGNOSIS — N184 Chronic kidney disease, stage 4 (severe): Secondary | ICD-10-CM

## 2016-03-26 DIAGNOSIS — I6381 Other cerebral infarction due to occlusion or stenosis of small artery: Secondary | ICD-10-CM

## 2016-03-26 DIAGNOSIS — E119 Type 2 diabetes mellitus without complications: Secondary | ICD-10-CM

## 2016-03-26 DIAGNOSIS — I6322 Cerebral infarction due to unspecified occlusion or stenosis of basilar arteries: Secondary | ICD-10-CM

## 2016-03-26 DIAGNOSIS — I63219 Cerebral infarction due to unspecified occlusion or stenosis of unspecified vertebral arteries: Secondary | ICD-10-CM

## 2016-03-26 DIAGNOSIS — I63413 Cerebral infarction due to embolism of bilateral middle cerebral arteries: Secondary | ICD-10-CM

## 2016-03-26 LAB — POCT INR
INR: 3.3
INR: 4

## 2016-03-26 MED ORDER — LOSARTAN POTASSIUM 50 MG PO TABS: 100.0000 mg | ORAL_TABLET | Freq: Every day | ORAL | 1 refills | Status: DC

## 2016-03-26 MED ORDER — LOSARTAN POTASSIUM 50 MG PO TABS: 50.0000 mg | ORAL_TABLET | Freq: Every day | ORAL | 1 refills | Status: DC

## 2016-03-26 NOTE — Patient Instructions (Addendum)
It was a pleasure meeting you today!  1. Today we talked about your blood pressure. Please take your blood pressure daily and bring a log of it to your next appointment so we can evaluate how your current medications are working. 2. Today we also discussed discontinuing Clonidine because of fatigue. At this point, I recommend continuing Clonidine because your blood pressure is still not controlled.  3. Please continue taking all other medications as prescribed. 4. Please follow up in 1 month for a blood pressure recheck.

## 2016-03-26 NOTE — Progress Notes (Signed)
Anti-Coagulation Progress Note  Patrick [name] is a 70 y.o. male who is currently on an anti-coagulation regimen.    RECENT RESULTS: Recent results are below, the most recent result is correlated with a dose of 32.5 mg. per week: Lab Results  Component Value Date   INR 3.30 03/26/2016   INR 4.00 03/26/2016   INR 2.5 (A) 12/21/2015    ANTI-COAG DOSE: Anticoagulation Dose Instructions as of 03/26/2016      [name] Tue Wed Thu Fri Sat   New Dose 5 mg 2.5 mg 5 mg 5 mg 5 mg 5 mg 5 mg       ANTICOAG SUMMARY: Anticoagulation Episode Summary    Current INR goal:   2.0-3.0  TTR:   63.3 % (2.6 y)  Next INR check:   04/16/2016  INR from last check:   3.30! (03/26/2016)  Weekly max dose:     Target end date:   Indefinite  INR check location:   [facility]  Preferred lab:     Send INR reminders to:      Indications   ATRIAL FIBRILLATION PAROXYSMAL CHRONIC (Resolved) [I48.91] Acute ischemic VBA thalamic stroke (HCC) [id number] I63.22] Chronic anticoagulation on coumadin [Z79.01]       Comments:           ANTICOAG TODAY: Anticoagulation Summary  As of 03/26/2016   INR goal:   2.0-3.0  TTR:     Today's INR:   3.30!  Next INR check:   04/16/2016  Target end date:   Indefinite   Indications   ATRIAL FIBRILLATION PAROXYSMAL CHRONIC (Resolved) [I48.91] Acute ischemic VBA thalamic stroke (HCC) [id number] I63.22] Chronic anticoagulation on coumadin [Z79.01]        Anticoagulation Episode Summary    INR check location:   [facility]   Preferred lab:      Send INR reminders to:      Comments:         PATIENT INSTRUCTIONS: There are no Patient Instructions on file for this visit.   FOLLOW-UP Return in about 3 weeks (around 04/16/2016) for Follow up INR at 2:30PM.  [name], III Pharm.D., CACP

## 2016-03-26 NOTE — Patient Instructions (Signed)
Patient instructed to take medications as defined in the Anti-coagulation Track section of this encounter.  Patient instructed to take today's dose.  Patient verbalized understanding of these instructions.    

## 2016-03-27 NOTE — Progress Notes (Signed)
CC: follow up of hypertension.   HPI:  Patrick [name] is a 70 y.o. male with medical history significant for refractory hypertension, CVA with residual memory loss and paucity of speech, IDDM, paroxysmal atrial fibrillation, and CKD. The patient presents with his wife who makes the majority of the patients medical decisions and presents most of the patients HPI.  Patrick [name] is here for a 1 month follow up of refractory hypertension. He is currently on Clonidine 0.3 mg TID, Hydralazine 50 mg TID, Losartan 50 mg, HCTZ 25 mg, Lasix 20 mg and cardizem 300 mg daily. Last month his dose of HCTZ was increased from 12.5 to 25 mg however his BP today in office is 169/84. When informing the patient and his wife of this, she vehemently stated that she does not want a new blood pressure medication nor does she want him to increase any of his current medications doses. The patients wife feels like he is already on too many medications. She states that since the patients stroke in April he has been sleeping more and feels this is due to his medications.  She said she had been reading side effects online of his medicines and saw that clonidine can cause drowsiness.   Past Medical History:  Diagnosis Date  . Atrial fibrillation ([place])   . CAD (coronary artery disease)   . Chronic anticoagulation, on coumadin 07/17/2013  . CKD (chronic kidney disease), stage II   . 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 Depression   . Diabetes mellitus type II    Insulin dependent  . GERD (gastroesophageal reflux disease)   . Gout   . Hyperlipidemia   . Hypertension   . Myocardial infarction [facility]) 2000's   "near heart attack" (07/14/2013)  . Paroxysmal atrial fibrillation (HCC)    on coumadin  . Permanent atrial fibrillation ([place]) 07/17/2013  . Shortness of breath    "can happen at any time" (07/14/2013)  . Sleep apnea 07/2010   "has mask at home; seldom uses it"  (07/14/2013)  . Stomach ulcer 1950's   "as a teenager"  . Stroke [facility]) ~ 2007; ~ 2009   "memory not as good since" (07/14/2013)  . Tachy-brady syndrome ([place]) 07/17/2013  . Upper GI bleeding 07/01/2013    Review of Systems:  Review of Systems  Constitutional: Positive for malaise/fatigue. Negative for fever.  Eyes: Negative for blurred vision and double vision.  Respiratory: Negative for cough and shortness of breath.   Cardiovascular: Negative for chest pain and leg swelling.  Gastrointestinal: Negative for blood in stool, nausea and vomiting.  Genitourinary: Negative for dysuria and hematuria.  Neurological: Negative for dizziness and headaches.     Physical Exam: Physical Exam  Constitutional: He is well-developed, well-nourished, and in no distress.  HENT:  Head: Normocephalic and atraumatic.  Cardiovascular: Normal rate and regular rhythm.   Pulmonary/Chest: Effort normal and breath sounds normal. He has no wheezes. He has no rales.  Abdominal: Soft. Bowel sounds are normal. He exhibits no distension. There is no tenderness.  Musculoskeletal: He exhibits no edema.  Neurological: He is alert.  Walks with cane. Denies any recent falls.  Skin: Skin is warm and dry.    Vitals:   03/26/16 1330  BP: (!) 169/84  Pulse: 70  Temp: 98 F (36.7 C)  TempSrc: Oral  SpO2: 100%  Weight: 182 lb (82.6 kg)  Height: 5\' 8"  (1.727 m)    Assessment &  Plan:   See Encounters Tab for problem based charting.  Patient seen with Dr. Lynnae January

## 2016-03-28 NOTE — Assessment & Plan Note (Signed)
Patient is here for a follow up of HTN. He is currently on 5 different antihypertensives (Clonidine 0.3mg  TID, Lasix 20mg  daily, HCTZ 25 mg daily, Losartan 50 mg daily and Hydralazine TID). The patients wife states he is taking all of his medications as directed. When informed that his blood pressure was still not at goal today she stated that she refused to put him on any more medications or increase the dose of his current ones. I encouraged the patient and his wife to take his blood pressure throughout the day so that we can better evaluate his blood pressure control.  Discussion was had on the importance of proper blood pressure control however the patients wife refused any increase in his medication. She complains that the patient has been fatigued since starting Clonidine and wants to stop this medication. I let the patient and the patients wife know that I do not recommend stopping it and that it cannot be discontinued abruptly.  -Continue Clonidine 0.3mg  TID -Continue HCTZ 25 mg daily -Continue Losartan 50 mg daily - I encouraged increasing this medication however pts wife refused -Continue Hydralazine 50 mg TID -Continue Lasix 20 mg daily

## 2016-03-28 NOTE — Progress Notes (Signed)
INTERNAL MEDICINE TEACHING ATTENDING ADDENDUM - Lalla Brothers M.D  Duration- indefinite, Indication- a fib, INR- supratherapeutic. Agree with pharmacy recommendations as outlined in their note.

## 2016-03-28 NOTE — Assessment & Plan Note (Signed)
Denies any symptomatic hypoglycemia. Continue glimepiride.

## 2016-03-28 NOTE — Assessment & Plan Note (Signed)
Rate controled during examination. Patient on Coumadin and Cardizem.  -Continue Cardizem 300 mg daily and Coumadin as dosed by cardiology

## 2016-03-28 NOTE — Progress Notes (Signed)
Internal Medicine Clinic Attending  I saw and evaluated the patient.  I personally confirmed the key portions of the history and exam documented by Dr. Danford Bad and I reviewed pertinent patient test results.  The assessment, diagnosis, and plan were formulated together and I agree with the documentation in the resident's note. Would consider changing HCTZ or lasix for spirolactone in future. That might provide effective BP control.

## 2016-03-29 ENCOUNTER — Telehealth: Payer: Self-pay | Admitting: Student-PharmD

## 2016-03-29 ENCOUNTER — Encounter: Payer: Medicare Other | Admitting: Pulmonary Disease

## 2016-03-29 NOTE — Telephone Encounter (Signed)
Called pt's pharmacy to obtain fill history:  acetaminophen (TYLENOL) 325 MG tablet -   cloNIDine (CATAPRES) 0.3 MG tablet - last filled 11/22/15, q. 90 diclofenac sodium (VOLTAREN) 1 % GEL - never picked up diltiazem (CARDIZEM CD) 300 MG 24 hr capsule - last filled 03/23/16, q. 30 docusate sodium (COLACE) 100 MG capsule -  donepezil (ARICEPT) 5 MG tablet - last filled 03/21/16, q. 30 furosemide (LASIX) 40 MG tablet - last filled 02/28/16, q.30 glimepiride (AMARYL) 2 MG tablet - last filled 01/19/16, q. 90 hydrALAZINE (APRESOLINE) 50 MG tablet - no fill history, last fill was hydralazine 100 on 03/23/16, q. 30 (1 q 8 hours) hydrochlorothiazide (HYDRODIURIL) 25 MG tablet - last filled 02/08/16, q. 30 losartan (COZAAR) 50 MG tablet - last filled 02/24/16, q. 90 mirtazapine (REMERON) 15 MG tablet - last filled 01/25/16, q. 90 polyethylene glycol (MIRALAX / GLYCOLAX) packet - not filled senna-docusate (SENOKOT-S) 8.6-50 MG tablet -  warfarin (COUMADIN) 5 MG tablet - last filled 01/12/16, q. 30   Jaonna Word PharmD Candidate, c/o 2019 03/29/2016 10:16 AM

## 2016-04-03 NOTE — Telephone Encounter (Signed)
Patient was seen reviewed by Terald Sleeper, PharmD candidate. I agree with the assessment and plan of care documented.

## 2016-04-16 ENCOUNTER — Ambulatory Visit (INDEPENDENT_AMBULATORY_CARE_PROVIDER_SITE_OTHER): Payer: Medicare Other | Admitting: Pharmacist

## 2016-04-16 DIAGNOSIS — Z8673 Personal history of transient ischemic attack (TIA), and cerebral infarction without residual deficits: Secondary | ICD-10-CM | POA: Diagnosis not present

## 2016-04-16 DIAGNOSIS — I63219 Cerebral infarction due to unspecified occlusion or stenosis of unspecified vertebral arteries: Secondary | ICD-10-CM

## 2016-04-16 DIAGNOSIS — I6322 Cerebral infarction due to unspecified occlusion or stenosis of basilar arteries: Secondary | ICD-10-CM

## 2016-04-16 DIAGNOSIS — Z7901 Long term (current) use of anticoagulants: Secondary | ICD-10-CM | POA: Diagnosis present

## 2016-04-16 DIAGNOSIS — I6381 Other cerebral infarction due to occlusion or stenosis of small artery: Secondary | ICD-10-CM

## 2016-04-16 DIAGNOSIS — I480 Paroxysmal atrial fibrillation: Secondary | ICD-10-CM | POA: Diagnosis not present

## 2016-04-16 LAB — POCT INR: INR: 3.1

## 2016-04-16 NOTE — Progress Notes (Signed)
Anti-Coagulation Progress Note  Patrick [name] is a 70 y.o. male who is currently on an anti-coagulation regimen.    RECENT RESULTS: Recent results are below, the most recent result is correlated with a dose of 32.5 mg. per week: Lab Results  Component Value Date   INR 3.1 04/16/2016   INR 3.30 03/26/2016   INR 4.00 03/26/2016    ANTI-COAG DOSE: Anticoagulation Dose Instructions as of 04/16/2016      [name] Tue Wed Thu Fri Sat   New Dose 5 mg 2.5 mg 5 mg 5 mg 5 mg 5 mg 5 mg       ANTICOAG SUMMARY: Anticoagulation Episode Summary    Current INR goal:   2.0-3.0  TTR:   62.0 % (2.6 y)  Next INR check:   05/07/2016  INR from last check:   3.1! (04/16/2016)  Weekly max dose:     Target end date:   Indefinite  INR check location:   [facility]  Preferred lab:     Send INR reminders to:      Indications   ATRIAL FIBRILLATION PAROXYSMAL CHRONIC (Resolved) [I48.91] Acute ischemic VBA thalamic stroke (HCC) [Z61.096 I63.22] Chronic anticoagulation on coumadin [Z79.01]       Comments:           ANTICOAG TODAY: Anticoagulation Summary  As of 04/16/2016   INR goal:   2.0-3.0  TTR:     Today's INR:   3.1!  Next INR check:   05/07/2016  Target end date:   Indefinite   Indications   ATRIAL FIBRILLATION PAROXYSMAL CHRONIC (Resolved) [I48.91] Acute ischemic VBA thalamic stroke (HCC) [E45.409 I63.22] Chronic anticoagulation on coumadin [Z79.01]        Anticoagulation Episode Summary    INR check location:   [facility]   Preferred lab:      Send INR reminders to:      Comments:         PATIENT INSTRUCTIONS: There are no Patient Instructions on file for this visit.   FOLLOW-UP Return in 3 weeks (on 05/07/2016) for F/U INR at 2:30pm.  Arrie Senate, PharmD PGY-1 Pharmacy Resident Pager: [phone] 04/16/2016

## 2016-04-16 NOTE — Patient Instructions (Signed)
Patient instructed to take medications as defined in the Anti-coagulation Track section of this encounter.  Patient instructed to take today's dose.  Patient verbalized understanding of these instructions.    

## 2016-04-24 NOTE — Progress Notes (Signed)
I have reviewed the anticoagulation note.  INR was slightly high (3.1) Patient on [facility] for Afib.  No change to dose.  Will forward to Dr. Elie Confer for review.

## 2016-05-12 IMAGING — MR MR HEAD W/O CM
9 of 10 series · 34 of 48 positions shown · non-contrast
Comparison: 11/18/2015 MR and CT.

CLINICAL DATA: 69-year-old hypertensive male with headache and
right-sided weakness. Subsequent encounter.

EXAM:
MRI HEAD WITHOUT CONTRAST
TECHNIQUE: Multiplanar, multiecho pulse sequences of the brain and surrounding
structures were obtained without intravenous contrast.

[Series 3: T1 · sagittal · 5.0mm · 0.47mm/px · 1 of 23 slices shown]
[im 1/23]
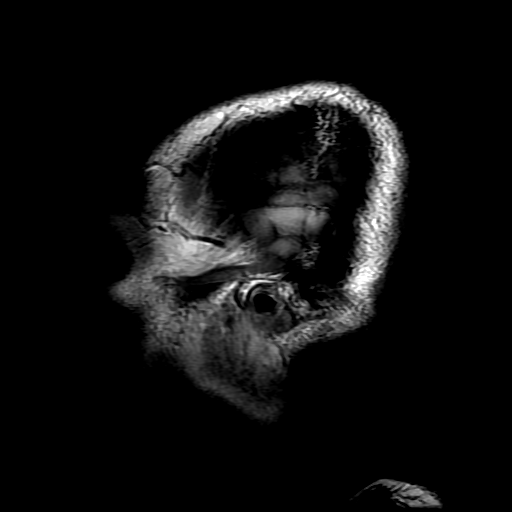

[Series 4: DWI · axial · 3.0mm · 1.09mm/px · z∈[-39,+96]mm · 8 of 94 slices shown (1 of 4)]
[im 1/94]
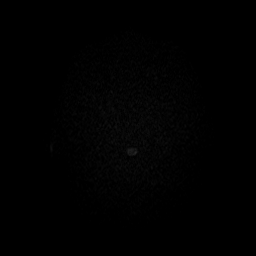
[im 11/94]
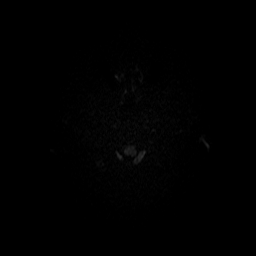
[im 32/94]
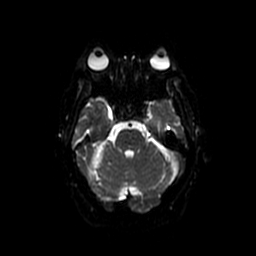
[im 42/94]
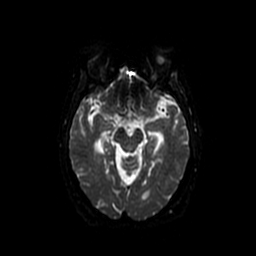
[im 52/94]
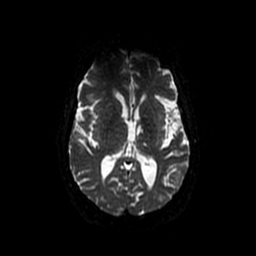
[im 63/94]
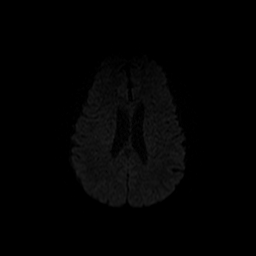
[im 83/94]
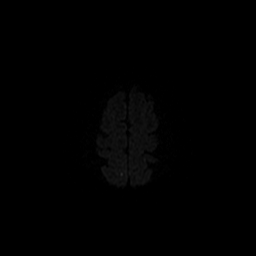
[im 94/94]
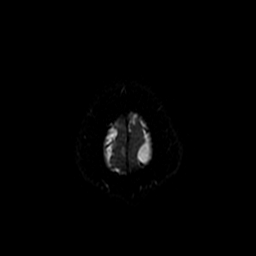

[Series 5: DWI · coronal · 5.0mm · 1.09mm/px · 7 of 66 slices shown (2 of 4)]
[im 1/66]
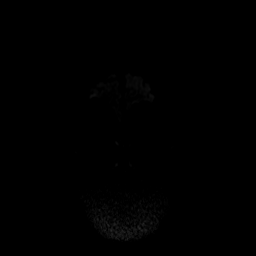
[im 11/66]
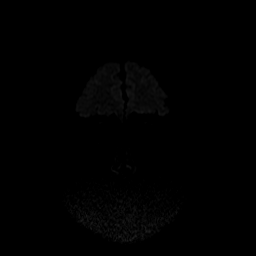
[im 22/66]
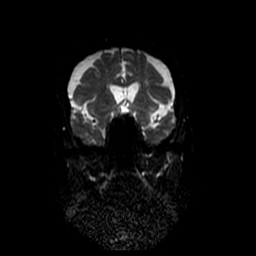
[im 33/66]
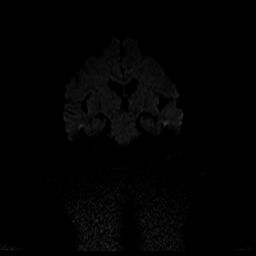
[im 44/66]
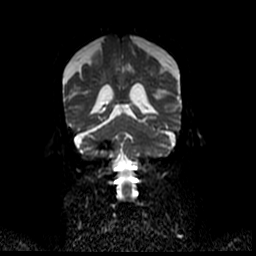
[im 55/66]
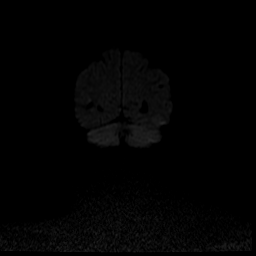
[im 66/66]
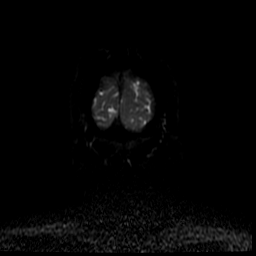

[Series 6: T2 · axial · 5.0mm · 0.47mm/px · z∈[-39,+102]mm · 3 of 25 slices shown (1 of 2)]
[im 1/25]
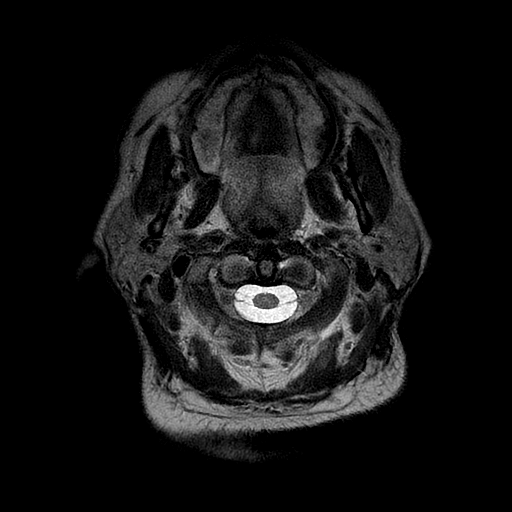
[im 13/25]
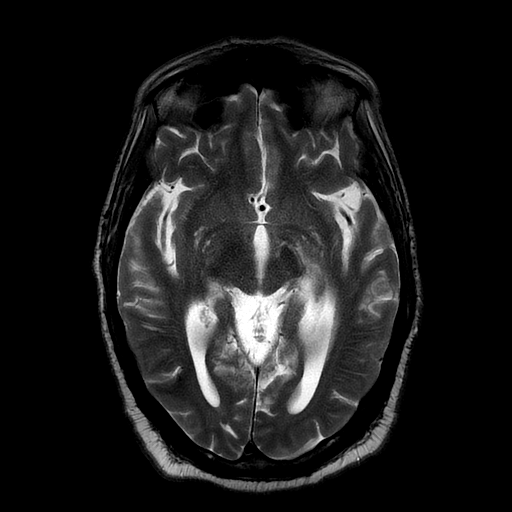
[im 25/25]
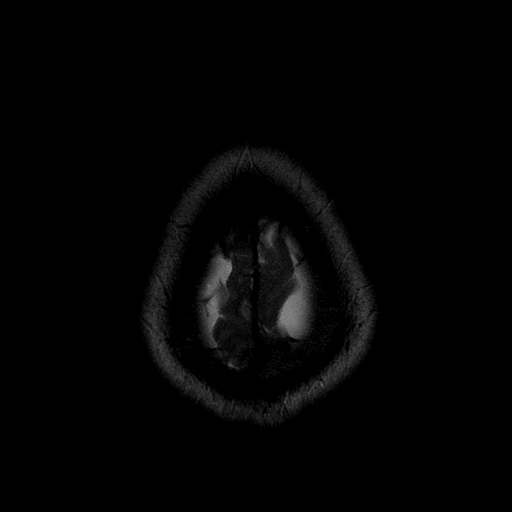

[Series 7: FLAIR · axial · 5.0mm · 0.47mm/px · z∈[-39,+102]mm · 3 of 25 slices shown]
[im 1/25]
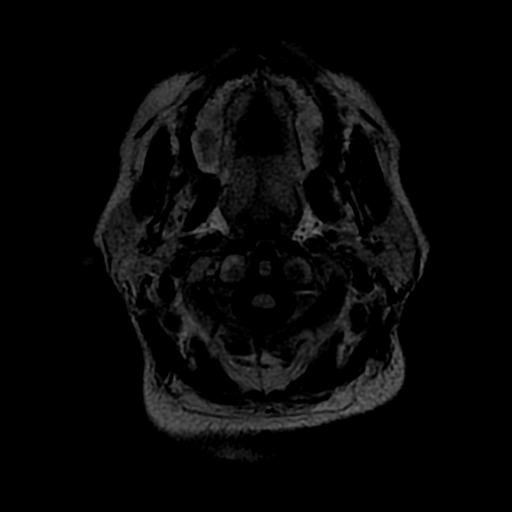
[im 13/25]
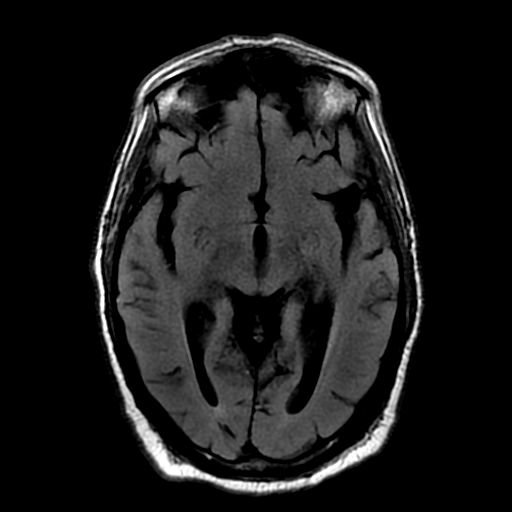
[im 25/25]
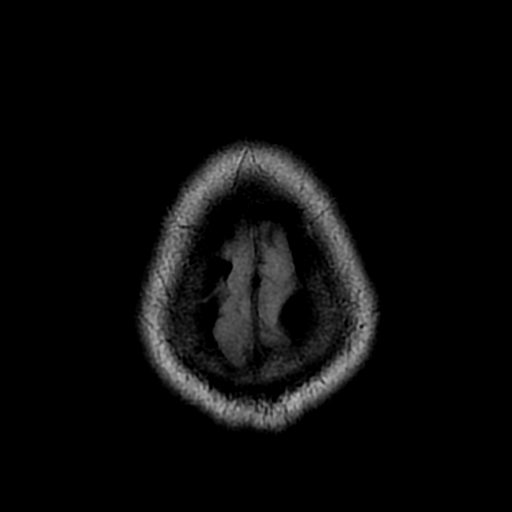

[Series 8: ax mpgr · axial · 5.0mm · 0.47mm/px · 1 of 25 slices shown]
[im 1/25]
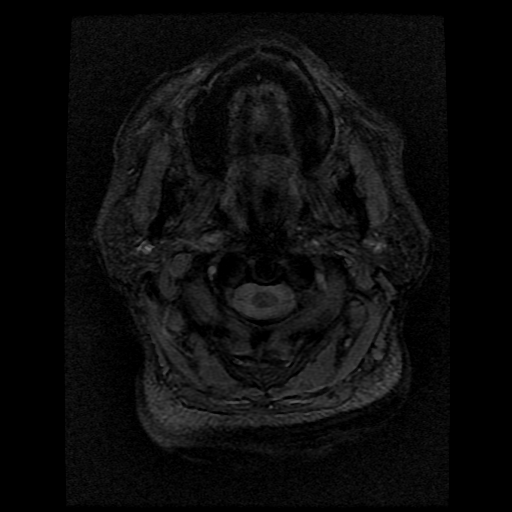

[Series 10: T2 · coronal · 5.0mm · 0.43mm/px · 3 of 29 slices shown (2 of 2)]
[im 1/29]
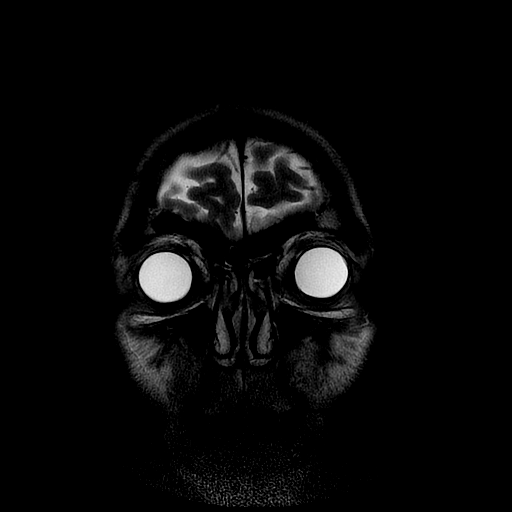
[im 15/29]
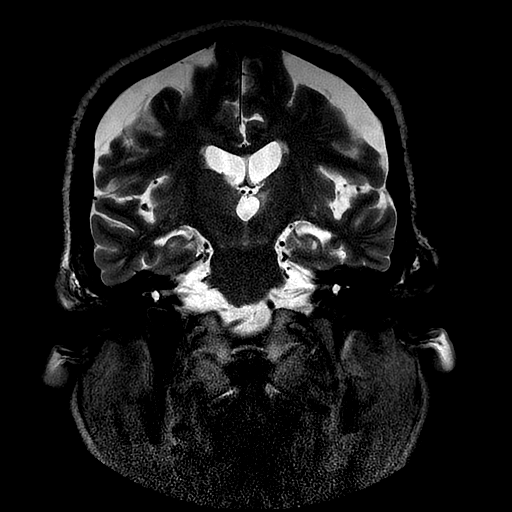
[im 29/29]
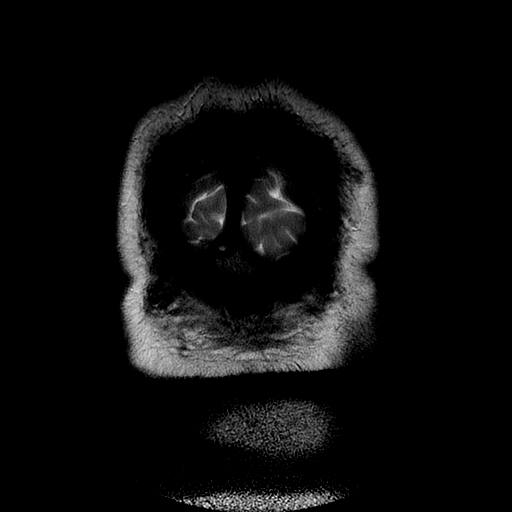

[Series 400: DWI · axial · 3.0mm · 1.09mm/px · z∈[-39,+96]mm · 5 of 47 slices shown (3 of 4)]
[im 1/47]
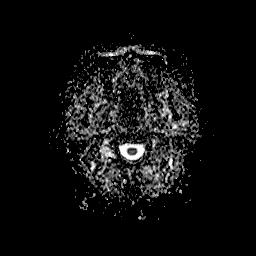
[im 12/47]
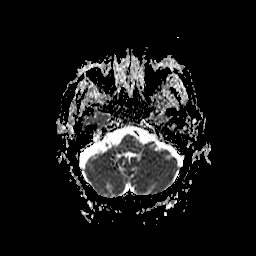
[im 24/47]
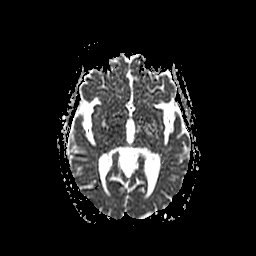
[im 35/47]
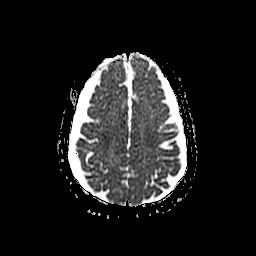
[im 47/47]
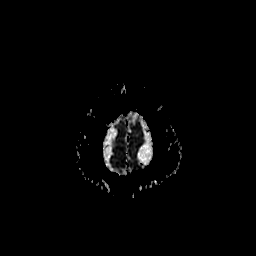

[Series 500: DWI · coronal · 5.0mm · 1.09mm/px · 3 of 33 slices shown (4 of 4)]
[im 1/33]
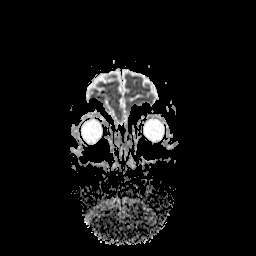
[im 17/33]
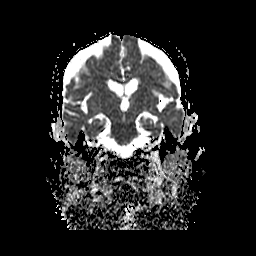
[im 33/33]
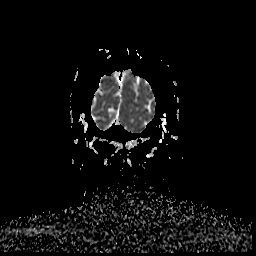

[34 of 48 positions shown; findings below may reference images not displayed]

FINDINGS: Interval development of scattered small nonhemorrhagic infarcts
medial aspect of the posterior frontal and parietal lobes
bilaterally.

Remote left thalamic infarct.

Blood breakdown products right cerebellum and posterior right
temporal -occipital junction suggestive of result of prior
hemorrhagic insult.

Mild chronic microvascular changes.

Global atrophy.

Prominent extra-axial spaces over the convexities. Although
unchanged from the recent examination, slight increase in size when
compared to 9569 (now measuring 1.1 cm on the right and 1.5 cm on
the left versus remote 0.9 cm on the right and 1.4 cm on the left in
9569). Inward and slight downward displacement of adjacent brain
parenchyma. 3.8 mm midline shift to the left. This may represent the
presence of bilateral subdural hygromas.

No intracranial mass lesion noted on this unenhanced exam.

Major intracranial vascular structures are patent. Superimposed
atherosclerotic changes of the vertebral arteries suspected. This is
more notable involving the right vertebral artery.

Partially empty non expanded sella.

Cervical spondylotic changes C3-4. Cervical medullary junction
unremarkable.
IMPRESSION: Interval development of scattered small nonhemorrhagic infarcts
medial aspect of the posterior frontal and parietal lobes
bilaterally.

Remainder of findings similar to the recent exam including:

Remote left thalamic infarct.

Blood breakdown products right cerebellum and posterior right
temporal -occipital junction suggestive of result of prior
hemorrhagic insult.

Mild chronic microvascular changes.

Global atrophy.

Prominent extra-axial spaces over the convexities. Although
unchanged from the recent examination, slight increase in size when
compared to 9569. Inward and slight downward displacement of
adjacent brain parenchyma. 3.8 mm midline shift to the left. This
may represent the presence of bilateral subdural hygromas.

Major intracranial vascular structures are patent. Superimposed
atherosclerotic changes of the vertebral arteries suspected. This is
more notable involving the right vertebral artery.

## 2016-05-23 ENCOUNTER — Other Ambulatory Visit: Payer: Self-pay | Admitting: Pulmonary Disease

## 2016-05-23 ENCOUNTER — Other Ambulatory Visit: Payer: Self-pay | Admitting: Internal Medicine

## 2016-05-23 DIAGNOSIS — I10 Essential (primary) hypertension: Secondary | ICD-10-CM

## 2016-06-12 ENCOUNTER — Ambulatory Visit: Payer: Medicare Other | Admitting: Neurology

## 2016-07-19 ENCOUNTER — Other Ambulatory Visit: Payer: Self-pay | Admitting: Pulmonary Disease

## 2016-07-20 ENCOUNTER — Other Ambulatory Visit: Payer: Self-pay | Admitting: Pulmonary Disease

## 2016-08-07 ENCOUNTER — Other Ambulatory Visit: Payer: Self-pay | Admitting: Pulmonary Disease

## 2016-08-07 DIAGNOSIS — I10 Essential (primary) hypertension: Secondary | ICD-10-CM

## 2016-09-17 ENCOUNTER — Encounter (INDEPENDENT_AMBULATORY_CARE_PROVIDER_SITE_OTHER): Payer: Self-pay

## 2016-09-17 ENCOUNTER — Encounter: Payer: Self-pay | Admitting: Pulmonary Disease

## 2016-09-17 ENCOUNTER — Ambulatory Visit (INDEPENDENT_AMBULATORY_CARE_PROVIDER_SITE_OTHER): Payer: Medicare Other | Admitting: Pulmonary Disease

## 2016-09-17 ENCOUNTER — Ambulatory Visit (INDEPENDENT_AMBULATORY_CARE_PROVIDER_SITE_OTHER): Payer: Medicare Other | Admitting: Pharmacist

## 2016-09-17 VITALS — BP 137/61 | HR 51 | Temp 97.5°F | Ht 68.5 in | Wt 180.1 lb

## 2016-09-17 DIAGNOSIS — N184 Chronic kidney disease, stage 4 (severe): Secondary | ICD-10-CM | POA: Diagnosis not present

## 2016-09-17 DIAGNOSIS — E1122 Type 2 diabetes mellitus with diabetic chronic kidney disease: Secondary | ICD-10-CM

## 2016-09-17 DIAGNOSIS — Z8673 Personal history of transient ischemic attack (TIA), and cerebral infarction without residual deficits: Secondary | ICD-10-CM | POA: Diagnosis not present

## 2016-09-17 DIAGNOSIS — I6381 Other cerebral infarction due to occlusion or stenosis of small artery: Secondary | ICD-10-CM

## 2016-09-17 DIAGNOSIS — Z7901 Long term (current) use of anticoagulants: Secondary | ICD-10-CM

## 2016-09-17 DIAGNOSIS — Z87891 Personal history of nicotine dependence: Secondary | ICD-10-CM | POA: Diagnosis not present

## 2016-09-17 DIAGNOSIS — I63219 Cerebral infarction due to unspecified occlusion or stenosis of unspecified vertebral arteries: Secondary | ICD-10-CM

## 2016-09-17 DIAGNOSIS — E119 Type 2 diabetes mellitus without complications: Secondary | ICD-10-CM | POA: Diagnosis not present

## 2016-09-17 DIAGNOSIS — E113419 Type 2 diabetes mellitus with severe nonproliferative diabetic retinopathy with macular edema, unspecified eye: Secondary | ICD-10-CM

## 2016-09-17 DIAGNOSIS — Z7984 Long term (current) use of oral hypoglycemic drugs: Secondary | ICD-10-CM | POA: Diagnosis not present

## 2016-09-17 DIAGNOSIS — I129 Hypertensive chronic kidney disease with stage 1 through stage 4 chronic kidney disease, or unspecified chronic kidney disease: Secondary | ICD-10-CM

## 2016-09-17 DIAGNOSIS — E1165 Type 2 diabetes mellitus with hyperglycemia: Secondary | ICD-10-CM

## 2016-09-17 DIAGNOSIS — I10 Essential (primary) hypertension: Secondary | ICD-10-CM

## 2016-09-17 DIAGNOSIS — I6322 Cerebral infarction due to unspecified occlusion or stenosis of basilar arteries: Secondary | ICD-10-CM

## 2016-09-17 LAB — POCT INR: INR: 1.1

## 2016-09-17 LAB — POCT GLYCOSYLATED HEMOGLOBIN (HGB A1C): HEMOGLOBIN A1C: 9.7

## 2016-09-17 LAB — GLUCOSE, CAPILLARY: Glucose-Capillary: 203 mg/dL — ABNORMAL HIGH (ref 65–99)

## 2016-09-17 MED ORDER — GLIMEPIRIDE 2 MG PO TABS: 3.0000 mg | ORAL_TABLET | Freq: Every day | ORAL | 0 refills | Status: DC

## 2016-09-17 NOTE — Patient Instructions (Addendum)
Increase your glimepiride to 1.5 (one and a half tablets) daily. Follow up in 6-8 weeks

## 2016-09-17 NOTE — Progress Notes (Signed)
   CC: hypertension follow up  HPI:  [name] is a 71 y.o. man with history as noted below here for hypertension follow up.  He is taking Cartia 300mg . He is due for his midday dose of clonidine at 3pm. He is on Lasix and HCTZ. Wife is not sure if he is still taking hydralazine. He is taking losartan once a day.  He is on glimepiride for diabetes. Eatting a lot of canned peaches.   He has not seen his renal doctor in a while.   Sleeps a lot. No loud snoring. Wife states that this has been his baseline for years.    Past Medical History:  Diagnosis Date  . Atrial fibrillation ([place])   . CAD (coronary artery disease)   . Chronic anticoagulation, on coumadin 07/17/2013  . CKD (chronic kidney disease), stage II   . 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 Depression   . Diabetes mellitus type II    Insulin dependent  . GERD (gastroesophageal reflux disease)   . Gout   . Hyperlipidemia   . Hypertension   . Myocardial infarction 2000's   "near heart attack" (07/14/2013)  . Paroxysmal atrial fibrillation ([facility])    on coumadin  . Permanent atrial fibrillation ([place]) 07/17/2013  . Shortness of breath    "can happen at any time" (07/14/2013)  . Sleep apnea 07/2010   "has mask at home; seldom uses it" (07/14/2013)  . Stomach ulcer 1950's   "as a teenager"  . Stroke [facility]) ~ 2007; ~ 2009   "memory not as good since" (07/14/2013)  . Tachy-brady syndrome ([place]) 07/17/2013  . Upper GI bleeding 07/01/2013    Review of Systems:   No fevers or chills No chest pain No dyspnea  Physical Exam:  Vitals:   09/17/16 1421 09/17/16 1500  BP: (!) 171/73 137/61  Pulse: (!) 51   Temp: 97.5 F (36.4 C)   TempSrc: Oral   SpO2: 100%   Weight: 180 lb 1.6 oz (81.7 kg)   Height: 5' 8.5" (1.74 m)    General Apperance: NAD HEENT: Normocephalic, atraumatic, anicteric sclera Neck: Supple, trachea midline Lungs: Clear to auscultation  bilaterally. No wheezes, rhonchi or rales. Breathing comfortably Heart: Regular rate and rhythm, no murmur/rub/gallop Abdomen: Soft, nontender, nondistended, no rebound/guarding Extremities: Warm and well perfused, no edema Skin: No rashes or lesions Neurologic: Alert and interactive. Mild proximal RLE weakness that is residual from previous CVA.  Assessment & Plan:   See Encounters Tab for problem based charting.  Patient discussed with Dr. Angelia Mould

## 2016-09-17 NOTE — Progress Notes (Signed)
Anti-Coagulation Progress Note  Patrick [name] is a 71 y.o. male who is currently on an anti-coagulation regimen.    RECENT RESULTS: Recent results are below, the most recent result is correlated with a dose of 32.5 mg. per week: Lab Results  Component Value Date   INR 1.10 09/17/2016   INR 3.1 04/16/2016   INR 3.30 03/26/2016    ANTI-COAG DOSE: Anticoagulation Dose Instructions as of 09/17/2016      [name] Tue Wed Thu Fri Sat   New Dose 5 mg 5 mg 5 mg 5 mg 5 mg 5 mg 5 mg    Description   Take 1 tablet by mouth daily at 6 PM.       ANTICOAG SUMMARY: Anticoagulation Episode Summary    Current INR goal:   2.0-3.0  TTR:   60.3 % (3.1 y)  Next INR check:   10/01/2016  INR from last check:   1.10! (09/17/2016)  Weekly max dose:     Target end date:   Indefinite  [facility]   [facility]  Preferred lab:     Send INR reminders to:      Indications   ATRIAL FIBRILLATION PAROXYSMAL CHRONIC (Resolved) [I48.91] Acute ischemic VBA thalamic stroke (HCC) [E76.147 I63.22] Chronic anticoagulation on coumadin [Z79.01]       Comments:           ANTICOAG TODAY: Anticoagulation Summary  As of 09/17/2016   INR goal:   2.0-3.0  TTR:     Today's INR:   1.10!  Next INR check:   10/01/2016  Target end date:   Indefinite   Indications   ATRIAL FIBRILLATION PAROXYSMAL CHRONIC (Resolved) [I48.91] Acute ischemic VBA thalamic stroke (HCC) [W92.957 I63.22] Chronic anticoagulation on coumadin [Z79.01]        Anticoagulation Episode Summary    [facility]   [facility]   Preferred lab:      Send INR reminders to:      Comments:         PATIENT INSTRUCTIONS:  Patient instructed to take medications as defined in the Anti-coagulation Track section of this encounter.  Patient instructed to take today's dose.  Patient instructed to take 1 tablet every day at 6 PM.  Patient verbalized understanding of these instructions.  FOLLOW-UP Return in 2 weeks (on  10/01/2016) for Follow-up INR at 3:30 PM.  Ihor Austin, PharmD

## 2016-09-17 NOTE — Patient Instructions (Signed)
Patient instructed to take medications as defined in the Anti-coagulation Track section of this encounter.  Patient instructed to take today's dose.  Patient instructed to take 1 tablet every day at 6 PM.  Patient verbalized understanding of these instructions.

## 2016-09-17 NOTE — Progress Notes (Signed)
Indication: Permanent atrial fibrillation. Duration: Indefinite. INR: Below target. Agree with Dr. Les Pou assessment and plan.

## 2016-09-18 LAB — BMP8+ANION GAP
ANION GAP: 24 mmol/L — AB (ref 10.0–18.0)
BUN/Creatinine Ratio: 17 (ref 10–24)
BUN: 84 mg/dL — AB (ref 8–27)
CALCIUM: 9.6 mg/dL (ref 8.6–10.2)
CO2: 25 mmol/L (ref 18–29)
CREATININE: 4.85 mg/dL — AB (ref 0.76–1.27)
Chloride: 93 mmol/L — ABNORMAL LOW (ref 96–106)
GFR calc Af Amer: 13 mL/min/{1.73_m2} — ABNORMAL LOW (ref >59)
GFR, EST NON AFRICAN AMERICAN: 11 mL/min/{1.73_m2} — AB (ref >59)
Glucose: 212 mg/dL — ABNORMAL HIGH (ref 65–99)
Potassium: 3.8 mmol/L (ref 3.5–5.2)
Sodium: 142 mmol/L (ref 134–144)

## 2016-09-18 MED ORDER — HYDRALAZINE HCL 25 MG PO TABS: 25.0000 mg | ORAL_TABLET | Freq: Three times a day (TID) | ORAL | 1 refills | Status: DC

## 2016-09-18 NOTE — Assessment & Plan Note (Addendum)
Assessment: BP uncontrolled initially but on recheck was 137/61.   Plan: Given the worsening renal function, will stop losartan and HCTZ. Restart hydralazine at 25mg  TID Continue diltiazem ER 300mg  Continue clonidine 0.3mg  TID Continue Lasix 20mg  daily Follow up in 1-2 weeks with BMP

## 2016-09-18 NOTE — Assessment & Plan Note (Signed)
Assessment: Renal function worsened with creatinine increasing to 4.85 and BUN of 84. Crcl 17. BUN 49 and creatinine 2.91 in July. No signs of uremia at this point. Bicarb normal. Has not seen nephrology in 3 years.  Plan: Discontinue HCTZ and losartan Referral back to nephrology.  Follow up in 1-2 weeks for repeat BMP

## 2016-09-18 NOTE — Assessment & Plan Note (Signed)
A1c deteriorated to 9.7 from 8.5. Increase glimepiride to 3mg  daily. May need to switch to glipizide with renal function

## 2016-09-19 NOTE — Progress Notes (Signed)
Internal Medicine Clinic Attending  Case discussed with Dr. Krall at the time of the visit.  We reviewed the resident's history and exam and pertinent patient test results.  I agree with the assessment, diagnosis, and plan of care documented in the resident's note.  

## 2016-09-21 ENCOUNTER — Telehealth: Payer: Self-pay

## 2016-09-21 NOTE — Telephone Encounter (Signed)
Please call pt back regarding meds.  

## 2016-09-21 NOTE — Telephone Encounter (Signed)
Pt's sig other states she is calling dr Maudie Mercury to speak with, transferred to dr kim

## 2016-10-01 ENCOUNTER — Ambulatory Visit (INDEPENDENT_AMBULATORY_CARE_PROVIDER_SITE_OTHER): Payer: Medicare Other | Admitting: Pharmacist

## 2016-10-01 DIAGNOSIS — I63219 Cerebral infarction due to unspecified occlusion or stenosis of unspecified vertebral arteries: Secondary | ICD-10-CM

## 2016-10-01 DIAGNOSIS — I6381 Other cerebral infarction due to occlusion or stenosis of small artery: Secondary | ICD-10-CM

## 2016-10-01 DIAGNOSIS — Z7901 Long term (current) use of anticoagulants: Secondary | ICD-10-CM | POA: Diagnosis present

## 2016-10-01 DIAGNOSIS — Z8673 Personal history of transient ischemic attack (TIA), and cerebral infarction without residual deficits: Secondary | ICD-10-CM | POA: Diagnosis not present

## 2016-10-01 DIAGNOSIS — I6322 Cerebral infarction due to unspecified occlusion or stenosis of basilar arteries: Secondary | ICD-10-CM

## 2016-10-01 LAB — POCT INR: INR: 2.6

## 2016-10-01 MED ORDER — WARFARIN SODIUM 5 MG PO TABS: 5.0000 mg | ORAL_TABLET | Freq: Every day | ORAL | 1 refills | Status: DC

## 2016-10-01 NOTE — Progress Notes (Signed)
Anti-Coagulation Progress Note  [name] is a 71 y.o. male who is currently on an anti-coagulation regimen.    RECENT RESULTS: Recent results are below, the most recent result is correlated with a dose of 35 mg. per week: Lab Results  Component Value Date   INR 2.60 10/01/2016   INR 1.10 09/17/2016   INR 3.1 04/16/2016    ANTI-COAG DOSE: Anticoagulation Dose Instructions as of 10/01/2016      [name] Tue Wed Thu Fri Sat   New Dose 5 mg 5 mg 5 mg 5 mg 5 mg 5 mg 5 mg    Description   Take 1 tablet by mouth daily at 6 PM.       ANTICOAG SUMMARY: Anticoagulation Episode Summary    Current INR goal:   2.0-3.0  TTR:   60.1 % (3.1 y)  Next INR check:   10/29/2016  INR from last check:   2.60 (10/01/2016)  Weekly max dose:     Target end date:   Indefinite  [facility]   [facility]  Preferred lab:     Send INR reminders to:      Indications   ATRIAL FIBRILLATION PAROXYSMAL CHRONIC (Resolved) [I48.91] Acute ischemic VBA thalamic stroke (HCC) [R51.884 I63.22] Chronic anticoagulation on coumadin [Z79.01]       Comments:           ANTICOAG TODAY: Anticoagulation Summary  As of 10/01/2016   INR goal:   2.0-3.0  TTR:     Today's INR:   2.60  Next INR check:   10/29/2016  Target end date:   Indefinite   Indications   ATRIAL FIBRILLATION PAROXYSMAL CHRONIC (Resolved) [I48.91] Acute ischemic VBA thalamic stroke (HCC) [Z66.063 I63.22] Chronic anticoagulation on coumadin [Z79.01]        Anticoagulation Episode Summary    [facility]   Preferred lab:      Send INR reminders to:      Comments:         PATIENT INSTRUCTIONS: Patient Instructions  Patient instructed to take medications as defined in the Anti-coagulation Track section of this encounter.  Patient instructed to take today's dose.  Patient instructed to take 1 tablet of your 5mg  peach colored warfarin tablets by mouth once-daily at [facility]. Patient verbalized  understanding of these instructions.       FOLLOW-UP Return in about 4 weeks (around 10/29/2016) for Follow up INR at 3:45PM.  [name], III Pharm.D., CACP

## 2016-10-01 NOTE — Patient Instructions (Signed)
Patient instructed to take medications as defined in the Anti-coagulation Track section of this encounter.  Patient instructed to take today's dose.  Patient instructed to take 1 tablet of your 5mg  peach colored warfarin tablets by mouth once-daily at [facility]. Patient verbalized understanding of these instructions.

## 2016-10-04 DIAGNOSIS — Z136 Encounter for screening for cardiovascular disorders: Secondary | ICD-10-CM | POA: Diagnosis not present

## 2016-10-04 DIAGNOSIS — F329 Major depressive disorder, single episode, unspecified: Secondary | ICD-10-CM | POA: Diagnosis not present

## 2016-10-04 DIAGNOSIS — Z01118 Encounter for examination of ears and hearing with other abnormal findings: Secondary | ICD-10-CM | POA: Diagnosis not present

## 2016-10-04 DIAGNOSIS — I110 Hypertensive heart disease with heart failure: Secondary | ICD-10-CM | POA: Diagnosis not present

## 2016-10-04 DIAGNOSIS — F0390 Unspecified dementia without behavioral disturbance: Secondary | ICD-10-CM | POA: Diagnosis not present

## 2016-10-04 DIAGNOSIS — H538 Other visual disturbances: Secondary | ICD-10-CM | POA: Diagnosis not present

## 2016-10-04 DIAGNOSIS — E1165 Type 2 diabetes mellitus with hyperglycemia: Secondary | ICD-10-CM | POA: Diagnosis not present

## 2016-10-04 DIAGNOSIS — I10 Essential (primary) hypertension: Secondary | ICD-10-CM | POA: Diagnosis not present

## 2016-10-04 DIAGNOSIS — Z0000 Encounter for general adult medical examination without abnormal findings: Secondary | ICD-10-CM | POA: Diagnosis not present

## 2016-10-04 NOTE — Progress Notes (Signed)
INTERNAL MEDICINE TEACHING ATTENDING ADDENDUM - Lizbet Cirrincione M.D  Duration- indefinite, Indication- afib, INR- therapeutic. Agree with pharmacy recommendations as outlined in their note.     

## 2016-10-11 ENCOUNTER — Telehealth: Payer: Self-pay

## 2016-10-11 ENCOUNTER — Ambulatory Visit (HOSPITAL_COMMUNITY)
Admission: EM | Admit: 2016-10-11 | Discharge: 2016-10-11 | Disposition: A | Discharge status: Other Acute Inpatient Hospital | Payer: Medicare Other

## 2016-10-11 ENCOUNTER — Other Ambulatory Visit: Payer: Self-pay | Admitting: *Deleted

## 2016-10-11 ENCOUNTER — Ambulatory Visit (INDEPENDENT_AMBULATORY_CARE_PROVIDER_SITE_OTHER): Payer: Medicare Other | Admitting: Internal Medicine

## 2016-10-11 VITALS — BP 164/71 | HR 52 | Temp 97.9°F | Ht 68.0 in

## 2016-10-11 DIAGNOSIS — M1A9XX0 Chronic gout, unspecified, without tophus (tophi): Secondary | ICD-10-CM

## 2016-10-11 DIAGNOSIS — Z87891 Personal history of nicotine dependence: Secondary | ICD-10-CM

## 2016-10-11 DIAGNOSIS — N189 Chronic kidney disease, unspecified: Secondary | ICD-10-CM | POA: Diagnosis not present

## 2016-10-11 DIAGNOSIS — M1A0710 Idiopathic chronic gout, right ankle and foot, without tophus (tophi): Secondary | ICD-10-CM

## 2016-10-11 DIAGNOSIS — M1A0720 Idiopathic chronic gout, left ankle and foot, without tophus (tophi): Secondary | ICD-10-CM | POA: Diagnosis not present

## 2016-10-11 MED ORDER — PREDNISONE 20 MG PO TABS: 20.0000 mg | ORAL_TABLET | Freq: Every day | ORAL | 0 refills | Status: DC

## 2016-10-11 NOTE — Assessment & Plan Note (Addendum)
A: Pt has hx of gout but taken of colchine and allopurinol due to CKD. Today he prsents with 3 days of acute b/l foot pain, right >left that has somewhat improved since it started. He has received prednisone in the past. Pt denies EtOH use but ate pork chops over the weekend.   P: Rx for prednisone 20mg  x 5 days and advised to take tylenol prn for pain.

## 2016-10-11 NOTE — Telephone Encounter (Signed)
Pt's caregiver calls and states he is having gout flare, wanted to be seen, states she was told [facility] could not see him today or tomorrow Opening was noted for 1515, ask dr's klima and narendra, pt will be seen in clinic at 1515 slot, they are leaving urg care and coming here

## 2016-10-11 NOTE — Progress Notes (Signed)
   CC: foot pain  HPI:  Patrick [name] is a 71 y.o. with past medical history as outlined below who presents to clinic for foot pain. Please see problem list for further details.   Past Medical History:  Diagnosis Date  . Atrial fibrillation ([place])   . CAD (coronary artery disease)   . Chronic anticoagulation, on coumadin 07/17/2013  . CKD (chronic kidney disease), stage II   . 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 Depression   . Diabetes mellitus type II    Insulin dependent  . GERD (gastroesophageal reflux disease)   . Gout   . Hyperlipidemia   . Hypertension   . Myocardial infarction 2000's   "near heart attack" (07/14/2013)  . Paroxysmal atrial fibrillation (H[facility])    on coumadin  . Permanent atrial fibrillation ([place]) 07/17/2013  . Shortness of breath    "can happen at any time" (07/14/2013)  . Sleep apnea 07/2010   "has mask at home; seldom uses it" (07/14/2013)  . Stomach ulcer 1950's   "as a teenager"  . Stroke [facility]) ~ 2007; ~ 2009   "memory not as good since" (07/14/2013)  . Tachy-brady syndrome ([place]) 07/17/2013  . Upper GI bleeding 07/01/2013    Review of Systems:  Foot pain x 3 days, sharp sensation, has improved since it first started.   Physical Exam:  Vitals:   10/11/16 1557  BP: (!) 164/71  Pulse: (!) 52  Temp: 97.9 F (36.6 C)  TempSrc: Oral  SpO2: 100%  Height: 5\' 8"  (1.727 m)   Physical Exam  Constitutional: appears well-developed and well-nourished. No distress.  HENT:  Head: Normocephalic and atraumatic.  Nose: Nose normal.  Neurological: alert and oriented to person, place, and time.  Ext: b/l 1+ pedal edema, rt foot slightly erythematous and exquisitely TTP.   Assessment & Plan:   See Encounters Tab for problem based charting.  Patient seen with Dr. Dareen Piano

## 2016-10-11 NOTE — Patient Instructions (Signed)
Tylenol 1000mg  up to 3 times a day but do not take more than 3000mg  per day.

## 2016-10-11 NOTE — Telephone Encounter (Signed)
Thank you [name] 

## 2016-10-11 NOTE — Telephone Encounter (Signed)
Needs to speak with a nurse about gout and meds. Please call back.

## 2016-10-17 NOTE — Progress Notes (Signed)
Internal Medicine Clinic Attending  I saw and evaluated the patient.  I personally confirmed the key portions of the history and exam documented by Dr. Truong and I reviewed pertinent patient test results.  The assessment, diagnosis, and plan were formulated together and I agree with the documentation in the resident's note.  

## 2016-10-22 ENCOUNTER — Emergency Department (HOSPITAL_COMMUNITY)
Admission: EM | Admit: 2016-10-22 | Discharge: 2016-10-22 | Disposition: A | Discharge status: Home / Self Care | Payer: Medicare Other | Attending: Emergency Medicine | Admitting: Emergency Medicine

## 2016-10-22 ENCOUNTER — Emergency Department (HOSPITAL_COMMUNITY): Payer: Medicare Other

## 2016-10-22 ENCOUNTER — Telehealth: Payer: Self-pay

## 2016-10-22 ENCOUNTER — Encounter (HOSPITAL_COMMUNITY): Payer: Self-pay | Admitting: *Deleted

## 2016-10-22 DIAGNOSIS — D649 Anemia, unspecified: Secondary | ICD-10-CM

## 2016-10-22 DIAGNOSIS — M109 Gout, unspecified: Secondary | ICD-10-CM | POA: Insufficient documentation

## 2016-10-22 DIAGNOSIS — M10371 Gout due to renal impairment, right ankle and foot: Secondary | ICD-10-CM

## 2016-10-22 DIAGNOSIS — Z8673 Personal history of transient ischemic attack (TIA), and cerebral infarction without residual deficits: Secondary | ICD-10-CM | POA: Diagnosis not present

## 2016-10-22 DIAGNOSIS — I252 Old myocardial infarction: Secondary | ICD-10-CM | POA: Diagnosis not present

## 2016-10-22 DIAGNOSIS — N184 Chronic kidney disease, stage 4 (severe): Secondary | ICD-10-CM | POA: Diagnosis not present

## 2016-10-22 DIAGNOSIS — Z79899 Other long term (current) drug therapy: Secondary | ICD-10-CM | POA: Insufficient documentation

## 2016-10-22 DIAGNOSIS — Z7901 Long term (current) use of anticoagulants: Secondary | ICD-10-CM | POA: Diagnosis not present

## 2016-10-22 DIAGNOSIS — M7989 Other specified soft tissue disorders: Secondary | ICD-10-CM | POA: Diagnosis not present

## 2016-10-22 DIAGNOSIS — I2510 Atherosclerotic heart disease of native coronary artery without angina pectoris: Secondary | ICD-10-CM | POA: Insufficient documentation

## 2016-10-22 DIAGNOSIS — M79671 Pain in right foot: Secondary | ICD-10-CM | POA: Diagnosis present

## 2016-10-22 DIAGNOSIS — I5030 Unspecified diastolic (congestive) heart failure: Secondary | ICD-10-CM | POA: Diagnosis not present

## 2016-10-22 DIAGNOSIS — I130 Hypertensive heart and chronic kidney disease with heart failure and stage 1 through stage 4 chronic kidney disease, or unspecified chronic kidney disease: Secondary | ICD-10-CM | POA: Diagnosis not present

## 2016-10-22 DIAGNOSIS — Z87891 Personal history of nicotine dependence: Secondary | ICD-10-CM | POA: Insufficient documentation

## 2016-10-22 LAB — CBC WITH DIFFERENTIAL/PLATELET
BASOS ABS: 0 10*3/uL (ref 0.0–0.1)
BASOS PCT: 0 %
EOS ABS: 0.2 10*3/uL (ref 0.0–0.7)
Eosinophils Relative: 2 %
HEMATOCRIT: 31 % — AB (ref 39.0–52.0)
Hemoglobin: 9.7 g/dL — ABNORMAL LOW (ref 13.0–17.0)
Lymphocytes Relative: 15 %
Lymphs Abs: 1.2 10*3/uL (ref 0.7–4.0)
MCH: 25.5 pg — ABNORMAL LOW (ref 26.0–34.0)
MCHC: 31.3 g/dL (ref 30.0–36.0)
MCV: 81.4 fL (ref 78.0–100.0)
MONO ABS: 0.6 10*3/uL (ref 0.1–1.0)
Monocytes Relative: 7 %
NEUTROS ABS: 6 10*3/uL (ref 1.7–7.7)
Neutrophils Relative %: 76 %
PLATELETS: 201 10*3/uL (ref 150–400)
RBC: 3.81 MIL/uL — ABNORMAL LOW (ref 4.22–5.81)
RDW: 13.5 % (ref 11.5–15.5)
WBC: 7.9 10*3/uL (ref 4.0–10.5)

## 2016-10-22 LAB — URINALYSIS, ROUTINE W REFLEX MICROSCOPIC
BACTERIA UA: NONE SEEN
BILIRUBIN URINE: NEGATIVE
GLUCOSE, UA: 50 mg/dL — AB
HGB URINE DIPSTICK: NEGATIVE
Ketones, ur: NEGATIVE mg/dL
NITRITE: NEGATIVE
PH: 5 (ref 5.0–8.0)
Protein, ur: 30 mg/dL — AB
SPECIFIC GRAVITY, URINE: 1.014 (ref 1.005–1.030)
Squamous Epithelial / LPF: NONE SEEN

## 2016-10-22 LAB — BASIC METABOLIC PANEL
ANION GAP: 9 (ref 5–15)
BUN: 41 mg/dL — ABNORMAL HIGH (ref 6–20)
CALCIUM: 8.7 mg/dL — AB (ref 8.9–10.3)
CO2: 28 mmol/L (ref 22–32)
CREATININE: 2.99 mg/dL — AB (ref 0.61–1.24)
Chloride: 101 mmol/L (ref 101–111)
GFR, EST AFRICAN AMERICAN: 23 mL/min — AB (ref >60)
GFR, EST NON AFRICAN AMERICAN: 20 mL/min — AB (ref >60)
Glucose, Bld: 206 mg/dL — ABNORMAL HIGH (ref 65–99)
Potassium: 3.5 mmol/L (ref 3.5–5.1)
Sodium: 138 mmol/L (ref 135–145)

## 2016-10-22 LAB — PROTIME-INR
INR: 3.07
PROTHROMBIN TIME: 32.4 s — AB (ref 11.4–15.2)

## 2016-10-22 LAB — POC OCCULT BLOOD, ED: Fecal Occult Bld: NEGATIVE

## 2016-10-22 MED ORDER — HYDROMORPHONE HCL 1 MG/ML IJ SOLN: 1.0000 mg | Freq: Once | INTRAMUSCULAR | Status: DC

## 2016-10-22 MED ORDER — PREDNISONE 20 MG PO TABS
60.0000 mg | ORAL_TABLET | Freq: Once | ORAL | Status: AC
  Administered 2016-10-22: 60 mg via ORAL
  Filled 2016-10-22: qty 3

## 2016-10-22 MED ORDER — HYDROMORPHONE HCL 1 MG/ML IJ SOLN
0.5000 mg | Freq: Once | INTRAMUSCULAR | Status: AC
  Administered 2016-10-22: 0.5 mg via INTRAMUSCULAR
  Filled 2016-10-22: qty 1

## 2016-10-22 MED ORDER — HYDROCODONE-ACETAMINOPHEN 5-325 MG PO TABS
1.0000 | ORAL_TABLET | Freq: Four times a day (QID) | ORAL | 0 refills | Status: DC | PRN
Start: 2016-10-22 — End: 2017-06-10

## 2016-10-22 MED ORDER — PREDNISONE 10 MG (21) PO TBPK: ORAL_TABLET | Freq: Every day | ORAL | 0 refills | Status: DC

## 2016-10-22 MED ORDER — CEPHALEXIN 250 MG PO CAPS: 250.0000 mg | ORAL_CAPSULE | Freq: Three times a day (TID) | ORAL | 0 refills | Status: AC

## 2016-10-22 MED ORDER — HYDROCODONE-ACETAMINOPHEN 5-325 MG PO TABS
2.0000 | ORAL_TABLET | Freq: Once | ORAL | Status: AC
  Administered 2016-10-22: 2 via ORAL
  Filled 2016-10-22: qty 2

## 2016-10-22 NOTE — ED Notes (Signed)
Walked pt with a walker down the hall patient did well

## 2016-10-22 NOTE — ED Provider Notes (Signed)
[facility] DEPT Provider Note   CSN: [id number] Arrival date & time: 10/22/16  1129  By signing my name below, I, Evelene Croon, attest that this documentation has been prepared under the direction and in the presence of Duffy Bruce, MD . Electronically Signed: Evelene Croon, Scribe. 10/22/2016. 1:33 PM.  History   Chief Complaint Chief Complaint  Patient presents with  . Foot Pain    The history is provided by the patient. No language interpreter was used.    HPI Comments:  Patrick [name] is a 71 y.o. male with a history of stage 2 CKD, IDDM and gout, who presents to the Emergency Department complaining of gradual onset, gradually worsening, right foot pain x 3-4 weeks. No obvious injury/trauma. Pt has a h/o gout in the right foot and believes his pain today can be attributed to his gout. He saw PCP on 10/10/16 for his pain and was discharged with a 5 day course of Prednisone which provided moderate relief but pain returned a few days ago. He has taken allopurinol in the past but was taken off ~ 1 year ago. Pt denies ETOH use but notes his diet may not be the best. No fever or pain to the LLE. Pt is currently on coumadin.   Past Medical History:  Diagnosis Date  . Atrial fibrillation ([place])   . CAD (coronary artery disease)   . Chronic anticoagulation, on coumadin 07/17/2013  . CKD (chronic kidney disease), stage II   . 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 Depression   . Diabetes mellitus type II    Insulin dependent  . GERD (gastroesophageal reflux disease)   . Gout   . Hyperlipidemia   . Hypertension   . Myocardial infarction 2000's   "near heart attack" (07/14/2013)  . Paroxysmal atrial fibrillation (HCC)    on coumadin  . Permanent atrial fibrillation ([place]) 07/17/2013  . Shortness of breath    "can happen at any time" (07/14/2013)  . Sleep apnea 07/2010   "has mask at home; seldom uses it" (07/14/2013)  . Stomach  ulcer 1950's   "as a teenager"  . Stroke [facility]) ~ 2007; ~ 2009   "memory not as good since" (07/14/2013)  . Tachy-brady syndrome ([place]) 07/17/2013  . Upper GI bleeding 07/01/2013    Patient Active Problem List   Diagnosis Date Noted  . Cerebrovascular accident (CVA) due to bilateral embolism of anterior cerebral arteries ([place]) 02/11/2016  . [place] 02/11/2016  . HLD (hyperlipidemia) 02/11/2016  . Cerebrovascular accident (CVA) due to bilateral embolism of middle cerebral arteries ([place])   . Diastolic CHF, chronic ([place]) 11/18/2015  . Hematuria 08/30/2015  . Chronic anemia 03/31/2014  . Healthcare maintenance 03/31/2014  . Permanent atrial fibrillation ([place]) 07/17/2013  . Chronic anticoagulation, on coumadin 07/17/2013  . Tachy-brady syndrome ([place]) 07/17/2013  . Acute ischemic VBA thalamic stroke ([place]) 12/11/2010  . Chronic kidney disease (CKD), stage IV (severe) ([place]) 12/21/2008  . CORONARY ARTERY DISEASE, non obstructive disease in 2005 with cath 07/19/2006  . Diabetes mellitus ([place]) 06/05/2006  . Hyperlipidemia 06/05/2006  . Gout 06/05/2006  . DEPRESSION 06/05/2006  . Essential hypertension 06/05/2006  . Sleep apnea 06/05/2006    Past Surgical History:  Procedure Laterality Date  . CARDIAC CATHETERIZATION  07/2003   Gareth Morgan notes 08/27/2005 (07/01/2013)       Home Medications    Prior to Admission medications   Medication Sig Start Date End Date Taking? Authorizing Provider  acetaminophen (TYLENOL) 325 MG tablet Take 2 tablets (650 mg total) by mouth every 6 (six) hours as needed for moderate pain or headache. 12/08/15   [name] Love, PA-C  CARTIA XT 300 MG 24 hr capsule TAKE ONE CAPSULE BY MOUTH ONCE DAILY 07/20/16   Milagros Loll, MD  cephALEXin (KEFLEX) 250 MG capsule Take 1 capsule (250 mg total) by mouth 3 (three) times daily. 10/22/16 10/29/16  Duffy Bruce, MD  cloNIDine (CATAPRES) 0.3 MG tablet TAKE ONE TABLET BY MOUTH THREE TIMES DAILY 08/08/16   Milagros Loll, MD  diclofenac sodium (VOLTAREN) 1 % GEL Apply 4 g topically 4 (four) times daily. 11/22/15   Liberty Handy, MD  docusate sodium (COLACE) 100 MG capsule Take 100 mg by mouth 2 (two) times daily.    Historical Provider, MD  donepezil (ARICEPT) 5 MG tablet TAKE ONE TABLET BY MOUTH AT BEDTIME 07/20/16   Milagros Loll, MD  furosemide (LASIX) 40 MG tablet TAKE ONE-HALF TABLET BY MOUTH ONCE DAILY 08/09/16   Milagros Loll, MD  glimepiride (AMARYL) 2 MG tablet Take 1.5 tablets (3 mg total) by mouth daily with breakfast. 09/17/16   Milagros Loll, MD  hydrALAZINE (APRESOLINE) 25 MG tablet Take 1 tablet (25 mg total) by mouth 3 (three) times daily. 09/18/16 09/18/17  Milagros Loll, MD  HYDROcodone-acetaminophen (NORCO/VICODIN) 5-325 MG tablet Take 1-2 tablets by mouth every 6 (six) hours as needed for moderate pain or severe pain. 10/22/16   Duffy Bruce, MD  mirtazapine (REMERON) 15 MG tablet Take 0.5 tablets (7.5 mg total) by mouth at bedtime. 01/18/16   Milagros Loll, MD  predniSONE (STERAPRED UNI-PAK 21 TAB) 10 MG (21) TBPK tablet Take by mouth daily. Take 6 tabs by mouth daily  for 2 days, then 5 tabs for 2 days, then 4 tabs for 2 days, then 3 tabs for 2 days, 2 tabs for 2 days, then 1 tab by mouth daily for 2 days 10/22/16   Duffy Bruce, MD  warfarin (COUMADIN) 5 MG tablet Take 1 tablet (5 mg total) by mouth daily at 6 PM. 10/01/16   Aldine Contes, MD    Family History Family History  Problem Relation Age of Onset  . Diabetes Mother   . Hypertension Mother   . Heart Problems Father   . Cancer Brother   . Hypertension Sister   . Diabetes Sister     Social History Social History  Substance Use Topics  . Smoking status: Former Smoker    Packs/day: 0.50    Years: 15.00    Types: Cigarettes    Quit date: 10/26/1976  . Smokeless tobacco: Former Systems developer     Comment: 07/14/2013 "quit chewing and dipping in ~ 1970"  . Alcohol use No     Comment: 07/14/2013 "drank a little; quit in  ~ 1970; never had problem w/it"     Allergies   Patient has no known allergies.   Review of Systems Review of Systems  Constitutional: Negative for chills, fatigue and fever.  HENT: Negative for congestion and rhinorrhea.   Eyes: Negative for visual disturbance.  Respiratory: Negative for cough, shortness of breath and wheezing.   Cardiovascular: Negative for chest pain and leg swelling.  Gastrointestinal: Negative for abdominal pain, diarrhea, nausea and vomiting.  Genitourinary: Negative for dysuria and flank pain.  Musculoskeletal: Positive for arthralgias, gait problem and myalgias. Negative for neck pain and neck stiffness.  Skin: Positive for rash. Negative for wound.  Allergic/Immunologic:  Negative for immunocompromised state.  Neurological: Negative for syncope, weakness and headaches.  All other systems reviewed and are negative.    Physical Exam Updated Vital Signs BP (!) 185/78 (BP Location: Right Arm)   Pulse (!) 55   Temp 98 F (36.7 C) (Oral)   Resp 20   Ht 5\' 8"  (1.727 m)   Wt 185 lb (83.9 kg)   SpO2 98%   BMI 28.13 kg/m   Physical Exam  Constitutional: He is oriented to person, place, and time. He appears well-developed and well-nourished.  Fatigued-appearing  HENT:  Head: Normocephalic and atraumatic.  Eyes: Conjunctivae are normal.  Neck: Neck supple.  Cardiovascular: Normal rate, regular rhythm and normal heart sounds.  Exam reveals no friction rub.   No murmur heard. Pulmonary/Chest: Effort normal and breath sounds normal. No respiratory distress. He has no wheezes. He has no rales.  Abdominal: He exhibits no distension.  Genitourinary:  Genitourinary Comments: No gross blood in rectal vault. Soft brown stool in vault. Guaiac : negative Chaperone was present for exam which was performed with no discomfort or complications.   Musculoskeletal: He exhibits no edema.  Neurological: He is alert and oriented to person, place, and time. He exhibits  normal muscle tone.  Skin: Skin is warm. Capillary refill takes less than 2 seconds.  Psychiatric: He has a normal mood and affect.  Nursing note and vitals reviewed.   UPPER EXTREMITY EXAM: RIGHT  INSPECTION & PALPATION: Mild erythema overlying MTP joint of great toe and extending 1-2 cm medially, with no fluctuance or drainage. No open wounds. Exquisite TTP over first MTP and distal metatarsal. No other TTP.   SENSORY: Sensation is intact to light touch in:  Superficial radial nerve distribution (dorsal first web space) Median nerve distribution (tip of index finger)   Ulnar nerve distribution (tip of small finger)     MOTOR:  + Motor posterior interosseous nerve (thumb IP extension) + Anterior interosseous nerve (thumb IP flexion, index finger DIP flexion) + Radial nerve (wrist extension) + Median nerve (palpable firing thenar mass) + Ulnar nerve (palpable firing of first dorsal interosseous muscle)  VASCULAR: 2+ radial pulse Brisk capillary refill < 2 sec, fingers warm and well-perfused  COMPARTMENTS: Soft, warm, well-perfused No pain with passive extension No paresthesias     ED Treatments / Results  DIAGNOSTIC STUDIES:  Oxygen Saturation is 100% on RA, normal by my interpretation.    COORDINATION OF CARE:  1:27 PM Discussed treatment plan with pt at bedside and pt agreed to plan.  Labs (all labs ordered are listed, but only abnormal results are displayed) Labs Reviewed  PROTIME-INR - Abnormal; Notable for the following:       Result Value   Prothrombin Time 32.4 (*)    All other components within normal limits  BASIC METABOLIC PANEL - Abnormal; Notable for the following:    Glucose, Bld 206 (*)    BUN 41 (*)    Creatinine, Ser 2.99 (*)    Calcium 8.7 (*)    GFR calc non Af Amer 20 (*)    GFR calc Af Amer 23 (*)    All other components within normal limits  CBC WITH DIFFERENTIAL/PLATELET - Abnormal; Notable for the following:    RBC 3.81 (*)     Hemoglobin 9.7 (*)    HCT 31.0 (*)    MCH 25.5 (*)    All other components within normal limits  URINALYSIS, ROUTINE W REFLEX MICROSCOPIC - Abnormal; Notable for the  following:    Glucose, UA 50 (*)    Protein, ur 30 (*)    Leukocytes, UA TRACE (*)    All other components within normal limits  URINE CULTURE  POC OCCULT BLOOD, ED    EKG  EKG Interpretation None       Radiology Dg Foot Complete Right  Result Date: 10/22/2016 CLINICAL DATA:  Foot pain and swelling, history of gout EXAM: RIGHT FOOT COMPLETE - 3+ VIEW COMPARISON:  06/05/2006 FINDINGS: No acute fracture or dislocation is noted. Mild vascular calcifications are seen. Calcaneal spurring is noted. Mild soft tissue swelling is noted in the distal aspect of the foot. No gouty tophi are seen. No erosive changes are noted. IMPRESSION: Soft tissue swelling without acute bony abnormality. Electronically Signed   By: Inez Catalina M.D.   On: 10/22/2016 12:37    Procedures Procedures (including critical care time)  Medications Ordered in ED Medications  HYDROcodone-acetaminophen (NORCO/VICODIN) 5-325 MG per tablet 2 tablet (2 tablets Oral Given 10/22/16 1344)  predniSONE (DELTASONE) tablet 60 mg (60 mg Oral Given 10/22/16 1441)  HYDROmorphone (DILAUDID) injection 0.5 mg (0.5 mg Intramuscular Given 10/22/16 1441)     Initial Impression / Assessment and Plan / ED Course  I have reviewed the triage vital signs and the nursing notes.  Pertinent labs & imaging results that were available during my care of the patient were reviewed by me and considered in my medical decision making (see chart for details).     71 yo M with PMHx as above here with ongoing right toe and foot pain. On arrival, VSS and WNL for patient. Exam is as above, remarkable for significant TTP over dorsum of foot and first MTP joint.   Exam, history is most c/w likely ongoing gout exacerbation, which transiently improved with prednisone but has likely recurred.  Plain films show no evidence of osteo or bony erosion. He is afebrile, well appearing, with normal WBC and I do not suspect septic arthritis. He has mild erythema overlying the toe c/w gout, which ahs been noted on prior exams as well. Treatment, however is somewhat limited 2/2 his CKD. Will refill a course of prednisone, give stronger analgesics, and d/c home. He is ambulatory in ED without difficulty following analgesia.  Otherwise, pt is at baseline state of heatlh. His CKD is actually improved from prior and he is without signs of DKA or significant hyperglycemia fro his recent prednisone course. His INR is therapeutic/mildly elevated, making DVT unlikely and I would not change his management at this time. UA without UTI. Of note, he does have an acute on chronic anemia. Hemoccult neg. No signs of active bleeding. I suspect this is 2/2 his CKD.  I discussed labs, visit with Internal Medicine MD on call. Pt will f/u at 2:15 PM wed. Wife notified and in agreement. Will give prednisone, keflex as pt does have that erythema (though may all be 2/2 underlying gout, but given his extensive history will tx conservatively), and d/c home.  Final Clinical Impressions(s) / ED Diagnoses   Final diagnoses:  Acute gout due to renal impairment involving right foot  Right foot pain  Anemia, unspecified type    New Prescriptions Discharge Medication List as of 10/22/2016  3:51 PM    START taking these medications   Details  cephALEXin (KEFLEX) 250 MG capsule Take 1 capsule (250 mg total) by mouth 3 (three) times daily., Starting Mon 10/22/2016, Until Mon 10/29/2016, Print    predniSONE (STERAPRED UNI-PAK 21  TAB) 10 MG (21) TBPK tablet Take by mouth daily. Take 6 tabs by mouth daily  for 2 days, then 5 tabs for 2 days, then 4 tabs for 2 days, then 3 tabs for 2 days, 2 tabs for 2 days, then 1 tab by mouth daily for 2 days, Starting Mon 10/22/2016, Print       I personally performed the services described in this  documentation, which was scribed in my presence. The recorded information has been reviewed and is accurate.    Duffy Bruce, MD 10/22/16 [phone]

## 2016-10-22 NOTE — ED Triage Notes (Signed)
Pt was seen by pcp on 3/14 and tx for gout to R foot.   Wife stated pt has decreased mobility since then d/t pain.

## 2016-10-22 NOTE — Telephone Encounter (Signed)
Needs to speak with the nurse about med. Please call back.

## 2016-10-23 LAB — URINE CULTURE: Special Requests: NORMAL

## 2016-10-23 NOTE — Telephone Encounter (Signed)
Pt went to ed.

## 2016-10-24 ENCOUNTER — Ambulatory Visit: Payer: Medicare Other

## 2016-10-29 ENCOUNTER — Ambulatory Visit (INDEPENDENT_AMBULATORY_CARE_PROVIDER_SITE_OTHER): Payer: Medicare Other | Admitting: Pharmacist

## 2016-10-29 ENCOUNTER — Telehealth: Payer: Self-pay | Admitting: Dietician

## 2016-10-29 ENCOUNTER — Encounter: Payer: Self-pay | Admitting: Dietician

## 2016-10-29 ENCOUNTER — Ambulatory Visit: Payer: Medicare Other

## 2016-10-29 DIAGNOSIS — I6322 Cerebral infarction due to unspecified occlusion or stenosis of basilar arteries: Secondary | ICD-10-CM

## 2016-10-29 DIAGNOSIS — I63219 Cerebral infarction due to unspecified occlusion or stenosis of unspecified vertebral arteries: Secondary | ICD-10-CM

## 2016-10-29 DIAGNOSIS — I6381 Other cerebral infarction due to occlusion or stenosis of small artery: Secondary | ICD-10-CM

## 2016-10-29 DIAGNOSIS — Z8673 Personal history of transient ischemic attack (TIA), and cerebral infarction without residual deficits: Secondary | ICD-10-CM

## 2016-10-29 DIAGNOSIS — Z7901 Long term (current) use of anticoagulants: Secondary | ICD-10-CM

## 2016-10-29 LAB — POCT INR: INR: 7.8

## 2016-10-29 NOTE — Patient Instructions (Signed)
Patient instructed to take medications as defined in the Anti-coagulation Track section of this encounter.  Patient instructed to OMIT/HOLD tomorrow's dose. Patient was provided 2.5mg  vitamin K1 and administered in the [facility] before leaving. Patient instructed to eat a dark-green leafy salad for tonight's dinner.Patient and wife were counseled extensively upon signs and symptoms that could suggest bleeding and patient was advised to report to the ED if there were any signs or symptoms of bleeding.   Patient verbalized understanding of these instructions.

## 2016-10-29 NOTE — Progress Notes (Signed)
Anti-Coagulation Progress Note  Patrick [name] is a 71 y.o. male who is currently on an anti-coagulation regimen.    RECENT RESULTS: Recent results are below, the most recent result is correlated with a dose of 35 mg. per week: Lab Results  Component Value Date   INR 7.80 10/29/2016   INR 3.07 10/22/2016   INR 2.60 10/01/2016    ANTI-COAG DOSE: Anticoagulation Dose Instructions as of 10/29/2016      [name] Tue Wed Thu Fri Sat   New Dose 5 mg 5 mg Hold Hold 5 mg 5 mg 5 mg    Description   Patient had already taken today's dose of warfarin. Discussed with Faculty Attending Physician the following plan of action:  Upon return to home, eat a dark-green leafy vegetable salad. Patient was administered here in [facility] 2.5mg  of vitamin K1 after stating he had no known drug allergies; TOMORROW's dose will be HELD/OMITTED; Wednesday's dose will be HELD/OMITTED. Patient to return to [facility] on Wednesday 4-APR-18 at 1100h for repeat INR determination. Patient and wife counseled extensively upon signs or symptoms of suggested increased bleeding for which he will return to the Emergency Department if they were to occur.        ANTICOAG SUMMARY: Anticoagulation Episode Summary    Current INR goal:   2.0-3.0  TTR:   60.1 % (3.2 y)  Next INR check:   10/31/2016  INR from last check:   7.80! (10/29/2016)  Weekly max dose:     Target end date:   Indefinite  INR check [facility]   [facility]  Preferred lab:     Send INR reminders to:      Indications   ATRIAL FIBRILLATION PAROXYSMAL CHRONIC (Resolved) [I48.91] Acute ischemic VBA thalamic stroke (HCC) [H96.222 I63.22] Chronic anticoagulation on coumadin [Z79.01]       Comments:           ANTICOAG TODAY: Anticoagulation Summary  As of 10/29/2016   INR goal:   2.0-3.0  TTR:     Today's INR:   7.80!  Next INR check:   10/31/2016  Target end date:   Indefinite   Indications   ATRIAL FIBRILLATION PAROXYSMAL CHRONIC (Resolved) [I48.91] Acute  ischemic VBA thalamic stroke (HCC) [L79.892 I63.22] Chronic anticoagulation on coumadin [Z79.01]        Anticoagulation Episode Summary    INR check location:   [facility]   Preferred lab:      Send INR reminders to:      Comments:         PATIENT INSTRUCTIONS: Patient Instructions  Patient instructed to take medications as defined in the Anti-coagulation Track section of this encounter.  Patient instructed to OMIT/HOLD tomorrow's dose. Patient was provided 2.5mg  vitamin K1 and administered in the [facility] before leaving. Patient instructed to eat a dark-green leafy salad for tonight's dinner.Patient and wife were counseled extensively upon signs and symptoms that could suggest bleeding and patient was advised to report to the ED if there were any signs or symptoms of bleeding.   Patient verbalized understanding of these instructions.       FOLLOW-UP Return in 2 days (on 10/31/2016) for Follow up INR at 1100h.[name], III Pharm.D., CACP

## 2016-10-29 NOTE — Telephone Encounter (Signed)
Steroid-Induced Hyperglycemia Prevention and Management Patrick [name] is a 71 y.o. male who meets criteria for [facility] quality improvement program (diabetes patient prescribed short course of steroids).  A/P Current Regimen  Patient prescribed prednisone from last Monday through yesterday. His wife says he did not check his blood sugar at all during that time.   Prednisone indication: gout  Current DM regimen 3 mg amaryl qam  Home BG Monitoring  Patient does have a meter at home and does not check BG at home. Meter was not supplied.  CBGs at home unknown  CBGs prior to steroid course unknown, A1C prior to steroid course 9.7 S/Sx of hyper- or hypoglycemia: none, none  ( Wife states sleepy, thirsty and peeing is his normal state)  Medication Management  Not applicable  Patient Education  Advised patient's wife to ask Mr. Kitner to monitor BG a few times today and tomorrow and call us if high.   Patient's wife did  verbalize understanding of information and regimen by repeating back topics discussed.  Follow-up by phone if he finds his blood sugars are high  11/22/16 with Dr. Corliss Skains, [place] 4:54 PM 10/29/2016\

## 2016-10-31 ENCOUNTER — Ambulatory Visit (INDEPENDENT_AMBULATORY_CARE_PROVIDER_SITE_OTHER): Payer: Medicare Other | Admitting: Pharmacist

## 2016-10-31 DIAGNOSIS — Z8673 Personal history of transient ischemic attack (TIA), and cerebral infarction without residual deficits: Secondary | ICD-10-CM

## 2016-10-31 DIAGNOSIS — I6322 Cerebral infarction due to unspecified occlusion or stenosis of basilar arteries: Secondary | ICD-10-CM

## 2016-10-31 DIAGNOSIS — I63219 Cerebral infarction due to unspecified occlusion or stenosis of unspecified vertebral arteries: Secondary | ICD-10-CM

## 2016-10-31 DIAGNOSIS — Z7901 Long term (current) use of anticoagulants: Secondary | ICD-10-CM | POA: Diagnosis present

## 2016-10-31 DIAGNOSIS — I6381 Other cerebral infarction due to occlusion or stenosis of small artery: Secondary | ICD-10-CM

## 2016-10-31 LAB — POCT INR: INR: 2.4

## 2016-10-31 NOTE — Progress Notes (Signed)
INTERNAL MEDICINE TEACHING ATTENDING ADDENDUM - Patrick [name] M.D  Duration- indefinite, Indication- afib, INR- supratherapeutic. Agree with pharmacy recommendations as outlined in their note.   Case d/w Dr. Elie Confer. Will give Vitamin K 2.5 mg PO * 1 dose and hold his next 2 doses and have him follow up in [facility]

## 2016-10-31 NOTE — Progress Notes (Signed)
Indication: Permanent atrial fibrillation. Duration: Indefinite. INR: At target. Agree with Dr. Gladstone Pih assessment and plan.

## 2016-10-31 NOTE — Patient Instructions (Signed)
Patient instructed to take medications as defined in the Anti-coagulation Track section of this encounter.  Patient instructed to take today's dose.  Patient instructed to take ONE (1) of your 5mg  peach-colored warfarin tablets by mouth at [facility] each day--EXCEPT on Fridays and Sundays--take ONLY 1/2 tablet of your 5mg  peach-colored warfarin tablets on these days.  Patient verbalized understanding of these instructions.

## 2016-10-31 NOTE — Progress Notes (Signed)
Anti-Coagulation Progress Note  Patrick [name] is a 71 y.o. male who is currently on an anti-coagulation regimen.    RECENT RESULTS: Recent results are below, the most recent result is correlated with a dose of having HELD warfarin on Tuesday April 3, and no warfarin yet today. He was administered vitamin K1 2.5mg  on Monday October 29, 2016 after discussion with Dr. Dareen Piano who concurred on this plan.  Will re-commence warfarin today. It was my advice that the patient would have a repeat INR performed this coming Monday 9-APR-18 but the wife deferred stating "I am getting tired of bringing him here so often.Marland KitchenMarland KitchenI think I have a doctor's appointment myself that day...". She deferred to return to clinic until Monday 16-APR-18. I advised that if any signs or symptoms of bleeding or embolic event(s) (which were discussed in detail) were to occur--to return to clinic or go to the ED. She and patient verbalized understanding of these instructions.  Lab Results  Component Value Date   INR 2.40 10/31/2016   INR 7.80 10/29/2016   INR 3.07 10/22/2016    ANTI-COAG DOSE: Anticoagulation Dose Instructions as of 10/31/2016      [name] Tue Wed Thu Fri Sat   New Dose 2.5 mg 5 mg 5 mg 5 mg 5 mg 2.5 mg 5 mg    Description   Take 1 tablet by mouth once-daily at 6PM--EXCEPT on Fridays and Sundays--take ONLY 1/2 tablet on these days.       ANTICOAG SUMMARY: Anticoagulation Episode Summary    Current INR goal:   2.0-3.0  TTR:   60.1 % (3.2 y)  Next INR check:   11/12/2016  INR from last check:   2.40 (10/31/2016)  Weekly max dose:     Target end date:   Indefinite  INR [facility]   [facility]  Preferred lab:     Send INR reminders to:      Indications   ATRIAL FIBRILLATION PAROXYSMAL CHRONIC (Resolved) [I48.91] Acute ischemic VBA thalamic stroke (HCC) [R67.893 I63.22] Chronic anticoagulation on coumadin [Z79.01]       Comments:           ANTICOAG TODAY: Anticoagulation Summary  As of  10/31/2016   INR goal:   2.0-3.0  TTR:     Today's INR:   2.40  Next INR check:   11/12/2016  Target end date:   Indefinite   Indications   ATRIAL FIBRILLATION PAROXYSMAL CHRONIC (Resolved) [I48.91] Acute ischemic VBA thalamic stroke (HCC) [Y10.175 I63.22] Chronic anticoagulation on coumadin [Z79.01]        Anticoagulation Episode Summary    INR check location[facility]   [facility]   Preferred lab:      Send INR reminders to:      Comments:         PATIENT INSTRUCTIONS: Patient Instructions  Patient instructed to take medications as defined in the Anti-coagulation Track section of this encounter.  Patient instructed to take today's dose.  Patient instructed to take ONE (1) of your 5mg  peach-colored warfarin tablets by mouth at [facility] each day--EXCEPT on Fridays and Sundays--take ONLY 1/2 tablet of your 5mg  peach-colored warfarin tablets on these days.  Patient verbalized understanding of these instructions.       FOLLOW-UP Return in 12 days (on 11/12/2016) for Follow up INR at 1100h.  [name], III Pharm.D., CACP

## 2016-11-12 ENCOUNTER — Ambulatory Visit (INDEPENDENT_AMBULATORY_CARE_PROVIDER_SITE_OTHER): Payer: Medicare Other | Admitting: Pharmacist

## 2016-11-12 DIAGNOSIS — I6322 Cerebral infarction due to unspecified occlusion or stenosis of basilar arteries: Secondary | ICD-10-CM

## 2016-11-12 DIAGNOSIS — Z8673 Personal history of transient ischemic attack (TIA), and cerebral infarction without residual deficits: Secondary | ICD-10-CM | POA: Diagnosis not present

## 2016-11-12 DIAGNOSIS — I6381 Other cerebral infarction due to occlusion or stenosis of small artery: Secondary | ICD-10-CM

## 2016-11-12 DIAGNOSIS — Z7901 Long term (current) use of anticoagulants: Secondary | ICD-10-CM

## 2016-11-12 DIAGNOSIS — I63219 Cerebral infarction due to unspecified occlusion or stenosis of unspecified vertebral arteries: Secondary | ICD-10-CM

## 2016-11-12 LAB — POCT INR: INR: 2.1

## 2016-11-12 NOTE — Patient Instructions (Signed)
Patient instructed to take medications as defined in the Anti-coagulation Track section of this encounter.  Patient instructed to take today's dose.  Patient instructed to take 1 tablet by mouth once-daily at [facility]. Patient verbalized understanding of these instructions.

## 2016-11-12 NOTE — Progress Notes (Signed)
Anti-Coagulation Progress Note  Patrick [name] is a 71 y.o. male who is currently on an anti-coagulation regimen.    RECENT RESULTS: Recent results are below, the most recent result is correlated with a dose of 30 mg. per week: Lab Results  Component Value Date   INR 2.10 11/12/2016   INR 2.40 10/31/2016   INR 7.80 10/29/2016    ANTI-COAG DOSE: Anticoagulation Dose Instructions as of 11/12/2016      [name] Tue Wed Thu Fri Sat   New Dose 5 mg 5 mg 5 mg 5 mg 5 mg 5 mg 5 mg    Description   Take 1 tablet by mouth once-daily at 6PM.      ANTICOAG SUMMARY: Anticoagulation Episode Summary    Current INR goal:   2.0-3.0  TTR:   60.5 % (3.2 y)  Next INR check:   12/03/2016  INR from last check:   2.10 (11/12/2016)  Weekly max dose:     Target end date:   Indefinite  [facility]   [facility]  Preferred lab:     Send INR reminders to:      Indications   ATRIAL FIBRILLATION PAROXYSMAL CHRONIC (Resolved) [I48.91] Acute ischemic VBA thalamic stroke (HCC) [Z36.644 I63.22] Chronic anticoagulation on coumadin [Z79.01]       Comments:           ANTICOAG TODAY: Anticoagulation Summary  As of 11/12/2016   INR goal:   2.0-3.0  TTR:     Today's INR:   2.10  Next INR check:   12/03/2016  Target end date:   Indefinite   Indications   ATRIAL FIBRILLATION PAROXYSMAL CHRONIC (Resolved) [I48.91] Acute ischemic VBA thalamic stroke (HCC) [I34.742 I63.22] Chronic anticoagulation on coumadin [Z79.01]        Anticoagulation Episode Summary    [facility]   Preferred lab:      Send INR reminders to:      Comments:         PATIENT INSTRUCTIONS: Patient Instructions  Patient instructed to take medications as defined in the Anti-coagulation Track section of this encounter.  Patient instructed to take today's dose.  Patient instructed to take 1 tablet by mouth once-daily at [facility]. Patient verbalized understanding of these instructions.       FOLLOW-UP Return in 3 weeks (on 12/03/2016) for Follow up INR at 2:15PM.  [name], III Pharm.D., CACP

## 2016-11-14 ENCOUNTER — Other Ambulatory Visit: Payer: Self-pay | Admitting: Pulmonary Disease

## 2016-11-22 ENCOUNTER — Encounter: Payer: Medicare Other | Admitting: Pulmonary Disease

## 2016-12-03 ENCOUNTER — Ambulatory Visit: Payer: Medicare Other

## 2016-12-10 ENCOUNTER — Ambulatory Visit (INDEPENDENT_AMBULATORY_CARE_PROVIDER_SITE_OTHER): Payer: Medicare Other | Admitting: Pharmacist

## 2016-12-10 DIAGNOSIS — Z8673 Personal history of transient ischemic attack (TIA), and cerebral infarction without residual deficits: Secondary | ICD-10-CM

## 2016-12-10 DIAGNOSIS — I6322 Cerebral infarction due to unspecified occlusion or stenosis of basilar arteries: Secondary | ICD-10-CM

## 2016-12-10 DIAGNOSIS — I482 Chronic atrial fibrillation: Secondary | ICD-10-CM

## 2016-12-10 DIAGNOSIS — Z7901 Long term (current) use of anticoagulants: Secondary | ICD-10-CM | POA: Diagnosis present

## 2016-12-10 DIAGNOSIS — I6381 Other cerebral infarction due to occlusion or stenosis of small artery: Secondary | ICD-10-CM

## 2016-12-10 DIAGNOSIS — I63219 Cerebral infarction due to unspecified occlusion or stenosis of unspecified vertebral arteries: Secondary | ICD-10-CM

## 2016-12-10 LAB — POCT INR: INR: 3.1

## 2016-12-10 NOTE — Progress Notes (Signed)
Anti-Coagulation Progress Note  Suleyman Ehrman is a 71 y.o. male who is currently on an anti-coagulation regimen for the indication of paroxsymal atrial fibrillation and previous thalamic stroke who is on continued anticoagulation which should with the advice and the consent of the patient be annually re-evaluated for the concerns of efficacy vs. safety.   RECENT RESULTS: Recent results are below, the most recent result is correlated with a dose of 35 mg. per week: Lab Results  Component Value Date   INR 3.10 12/10/2016   INR 2.10 11/12/2016   INR 2.40 10/31/2016    ANTI-COAG DOSE: Anticoagulation Warfarin Dose Instructions as of 12/10/2016      [name] Tue Wed Thu Fri Sat   New Dose 5 mg 2.5 mg 5 mg 5 mg 5 mg 5 mg 5 mg    Description   Take 1 tablet by mouth once-daily at 6PM except on Mondays--take only 1/2 tablet on Mondays.       ANTICOAG SUMMARY: Anticoagulation Episode Summary    Current INR goal:   2.0-3.0  TTR:   61.2 % (3.3 y)  Next INR check:   12/31/2016  INR from last check:   3.10! (12/10/2016)  Weekly max warfarin dose:     Target end date:   Indefinite  [facility]  Preferred lab:     Send INR reminders to:      Indications   ATRIAL FIBRILLATION PAROXYSMAL CHRONIC (Resolved) [I48.91] Acute ischemic VBA thalamic stroke (HCC) [B09.628 I63.22] Chronic anticoagulation on coumadin [Z79.01]       Comments:           ANTICOAG TODAY: Anticoagulation Summary  As of 12/10/2016   INR goal:   2.0-3.0  TTR:     Today's INR:   3.10!  Next INR check:   12/31/2016  Target end date:   Indefinite   Indications   ATRIAL FIBRILLATION PAROXYSMAL CHRONIC (Resolved) [I48.91] Acute ischemic VBA thalamic stroke (HCC) [Z66.294 I63.22] Chronic anticoagulation on coumadin [Z79.01]        Anticoagulation Episode Summary    [facility]   Preferred lab:      Send INR reminders to:      Comments:         PATIENT  INSTRUCTIONS: Patient Instructions  Patient instructed to take medications as defined in the Anti-coagulation Track section of this encounter.  Patient instructed to take today's dose.  Patient instructed to take 1/2 tablet on Mondays of each week; all other days--take 1 of your 5mg  peach-colored warfarin tablets by mouth once-daily at [facility].  Patient verbalized understanding of these instructions.       FOLLOW-UP Return in 3 weeks (on 12/31/2016) for Follow up INR at 2:45PM.  [name], III Pharm.D., CACP

## 2016-12-10 NOTE — Patient Instructions (Signed)
Patient instructed to take medications as defined in the Anti-coagulation Track section of this encounter.  Patient instructed to take today's dose.  Patient instructed to take 1/2 tablet on Mondays of each week; all other days--take 1 of your 5mg  peach-colored warfarin tablets by mouth once-daily at [facility].  Patient verbalized understanding of these instructions.

## 2016-12-11 NOTE — Progress Notes (Signed)
INTERNAL MEDICINE TEACHING ATTENDING ADDENDUM - Faduma Cho M.D  Duration- indefinite, Indication- afib, INR- supratherapeutic. Agree with pharmacy recommendations as outlined in their note.     

## 2016-12-21 ENCOUNTER — Other Ambulatory Visit: Payer: Self-pay | Admitting: Pulmonary Disease

## 2016-12-21 ENCOUNTER — Other Ambulatory Visit: Payer: Self-pay | Admitting: Internal Medicine

## 2016-12-21 DIAGNOSIS — I10 Essential (primary) hypertension: Secondary | ICD-10-CM

## 2016-12-23 ENCOUNTER — Other Ambulatory Visit: Payer: Self-pay | Admitting: Pulmonary Disease

## 2016-12-26 ENCOUNTER — Other Ambulatory Visit: Payer: Self-pay | Admitting: Pulmonary Disease

## 2016-12-26 NOTE — Telephone Encounter (Signed)
Refill Request.  Pt unsure of which ones and would like a call back to help him figure out which ones really need filling.

## 2016-12-27 NOTE — Telephone Encounter (Signed)
Called again, no answer, no vmail

## 2016-12-27 NOTE — Telephone Encounter (Signed)
Called pt, someone picked up and then disconnected the call

## 2016-12-31 ENCOUNTER — Ambulatory Visit (INDEPENDENT_AMBULATORY_CARE_PROVIDER_SITE_OTHER): Payer: Medicare Other

## 2016-12-31 DIAGNOSIS — Z7901 Long term (current) use of anticoagulants: Secondary | ICD-10-CM

## 2016-12-31 DIAGNOSIS — Z8673 Personal history of transient ischemic attack (TIA), and cerebral infarction without residual deficits: Secondary | ICD-10-CM

## 2016-12-31 LAB — POCT INR: INR: 1.2

## 2016-12-31 NOTE — Patient Instructions (Signed)
Patient instructed to take medications as defined in the Anti-coagulation Track section of this encounter.  Patient instructed to take today's dose.  Patient verbalized understanding of these instructions.  Patient instructed to restart warfarin at original dose and call IMMEDIATELY should any further signs or symptoms of bleeding occur. Patient instructed to return to clinic 01/14/17 @ 2:15pm

## 2016-12-31 NOTE — Progress Notes (Signed)
Anticoagulation Management Patrick [name] is a 71 y.o. male who reports to the clinic for monitoring of warfarin treatment.    Indication: atrial fibrillation Duration: indefinite Supervising physician: [facility] Visit History: Patient does report signs/symptoms of bleeding or thromboembolism: reports blood in urine for multiple days for which he stopped taking the warfarin. He does not recall when the hematuria started, nor when he stopped taking his warfarin. He states his hematuria stopped after stopping his warfarin.   Other recent changes: No changes in diet, medications, lifestyle Anticoagulation Episode Summary    Current INR goal:   2.0-3.0  TTR:   61.0 % (3.3 y)  Next INR check:   12/31/2016  INR from last check:   3.10! (12/10/2016)  Most recent INR:    1.2! (12/31/2016)  Weekly max warfarin dose:     Target end date:   Indefinite  [facility]  Preferred lab:     Send INR reminders to:      Indications   ATRIAL FIBRILLATION PAROXYSMAL CHRONIC (Resolved) [I48.91] Acute ischemic VBA thalamic stroke (HCC) [J28.786 I63.22] Chronic anticoagulation on coumadin [Z79.01]       Comments:          ASSESSMENT Recent Results: The most recent result is correlated with 32.5 mg per week: Lab Results  Component Value Date   INR 1.2 12/31/2016   INR 3.10 12/10/2016   INR 2.10 11/12/2016    Anticoagulation Dosing: INR as of 12/10/2016 and Previous Warfarin Dosing Information    INR Dt INR Goal [employer] Sun Mon Tue Wed Thu Fri Sat   12/10/2016 3.10 2.0-3.0 35 mg 5 mg 5 mg 5 mg 5 mg 5 mg 5 mg 5 mg    Previous description   Take 1 tablet by mouth once-daily at 6PM.   Anticoagulation Warfarin Dose Instructions as of 12/10/2016      Total Sun Mon Tue Wed Thu Fri Sat   New Dose 32.5 mg 5 mg 2.5 mg 5 mg 5 mg 5 mg 5 mg 5 mg     (5 mg x 1)  (5 mg x 0.5)  (5 mg x 1)  (5 mg x 1)  (5 mg x 1)  (5 mg x 1)  (5 mg x 1)                   Description   Take 1 tablet by mouth once-daily at 6PM except on Mondays--take only 1/2 tablet on Mondays.      INR today: Subtherapeutic after holding doses for multiple days.   PLAN Weekly dose was unchanged. Mr. Full was instructed to restart his warfarin at the original dose and call the clinic IMMEDIATELY if hematuria or any other signs of bleeding occur.   Patient advised to contact clinic or seek medical attention if signs/symptoms of bleeding or thromboembolism occur.  Patient verbalized understanding by repeating back information and was advised to contact me if further medication-related questions arise. Patient was also provided an information handout.  Follow-up Return in about 2 weeks (around 01/14/2017) for INR Check.  Dierdre Harness, [place], PharmD Clinical Pharmacy Resident [phone] (Pager) 12/31/2016 3:28 PM  15 minutes spent face-to-face with the patient during the encounter. 50% of time spent on education.

## 2017-01-01 NOTE — Telephone Encounter (Signed)
Pt's wife unsure which med he needs refilled - but will call after talking to pt if still needs any refills.

## 2017-01-11 ENCOUNTER — Encounter: Payer: Self-pay | Admitting: *Deleted

## 2017-01-14 ENCOUNTER — Ambulatory Visit (INDEPENDENT_AMBULATORY_CARE_PROVIDER_SITE_OTHER): Payer: Medicare Other

## 2017-01-14 DIAGNOSIS — I63219 Cerebral infarction due to unspecified occlusion or stenosis of unspecified vertebral arteries: Secondary | ICD-10-CM

## 2017-01-14 DIAGNOSIS — I6322 Cerebral infarction due to unspecified occlusion or stenosis of basilar arteries: Secondary | ICD-10-CM

## 2017-01-14 DIAGNOSIS — I6381 Other cerebral infarction due to occlusion or stenosis of small artery: Secondary | ICD-10-CM

## 2017-01-14 DIAGNOSIS — Z8673 Personal history of transient ischemic attack (TIA), and cerebral infarction without residual deficits: Secondary | ICD-10-CM | POA: Diagnosis not present

## 2017-01-14 DIAGNOSIS — Z7901 Long term (current) use of anticoagulants: Secondary | ICD-10-CM | POA: Diagnosis present

## 2017-01-14 LAB — POCT INR: INR: 2.2

## 2017-01-14 NOTE — Progress Notes (Signed)
Anti-Coagulation Progress Note  Patrick [name] is a 71 y.o. male who is currently on an anti-coagulation regimen.    RECENT RESULTS: Recent results are below, the most recent result is correlated with a dose of 32.5 mg. per week: Lab Results  Component Value Date   INR 2.2 01/14/2017   INR 1.2 12/31/2016   INR 3.10 12/10/2016    ANTI-COAG DOSE: Anticoagulation Warfarin Dose Instructions as of 01/14/2017      [name] Tue Wed Thu Fri Sat   New Dose 5 mg 2.5 mg 5 mg 5 mg 5 mg 5 mg 5 mg    Description   Take 1 tablet by mouth once-daily at 6PM except take only 1/2 tablet on Mondays.       ANTICOAG SUMMARY: Anticoagulation Episode Summary    Current INR goal:   2.0-3.0  TTR:   60.6 % (3.4 y)  Next INR check:   02/04/2017  INR from last check:   2.2 (01/14/2017)  Weekly max warfarin dose:     Target end date:   Indefinite  [facility]   [facility]  Preferred lab:     Send INR reminders to:      Indications   ATRIAL FIBRILLATION PAROXYSMAL CHRONIC (Resolved) [I48.91] Acute ischemic VBA thalamic stroke (HCC) [V37.106 I63.22] Chronic anticoagulation on coumadin [Z79.01]       Comments:           ANTICOAG TODAY: Anticoagulation Summary  As of 01/14/2017   INR goal:   2.0-3.0  TTR:     Today's INR:   2.2  Next INR check:   02/04/2017  Target end date:   Indefinite   Indications   ATRIAL FIBRILLATION PAROXYSMAL CHRONIC (Resolved) [I48.91] Acute ischemic VBA thalamic stroke (HCC) [Y69.485 I63.22] Chronic anticoagulation on coumadin [Z79.01]        Anticoagulation Episode Summary    [facility]   [facility]   Preferred lab:      Send INR reminders to:      Comments:         PATIENT INSTRUCTIONS: Patient Instructions  Patient instructed to take medications as defined in the Anti-coagulation Track section of this encounter.  Patient instructed to take today's dose. Take 1 tablet by mouth once-daily at 6PM except take only 1/2 tablet on  Mondays.  Patient advised to call clinic if he has any more blood in urine. Patient verbalized understanding of these instructions.      FOLLOW-UP Return in about 3 weeks (around 02/04/2017) for follow up INR.   Gwenlyn Perking, PharmD PGY1 Pharmacy Resident Pager: [phone] 01/14/2017 3:47 PM

## 2017-01-14 NOTE — Patient Instructions (Addendum)
Patient instructed to take medications as defined in the Anti-coagulation Track section of this encounter.  Patient instructed to take today's dose. Take 1 tablet by mouth once-daily at 6PM except take only 1/2 tablet on Mondays.  Patient advised to call clinic if he has any more blood in urine. Patient verbalized understanding of these instructions.

## 2017-01-21 ENCOUNTER — Other Ambulatory Visit: Payer: Self-pay | Admitting: Pulmonary Disease

## 2017-01-21 NOTE — Telephone Encounter (Signed)
Refill X 1 month.  Not adequately documented, needs to be documented by new PCP.

## 2017-02-04 ENCOUNTER — Other Ambulatory Visit: Payer: Self-pay | Admitting: Pulmonary Disease

## 2017-02-04 DIAGNOSIS — I10 Essential (primary) hypertension: Secondary | ICD-10-CM

## 2017-02-06 ENCOUNTER — Telehealth: Payer: Self-pay | Admitting: *Deleted

## 2017-02-06 ENCOUNTER — Telehealth: Payer: Self-pay | Admitting: Internal Medicine

## 2017-02-06 NOTE — Telephone Encounter (Signed)
Per patient's wife, Patrick [name] stopped taking his warfarin few weeks ago. Last INR visit 6/18. Appt w/ pcp 9/20 & refuses to come for an earlier appt or for lab. Wife said she will talk to him again about taking his meds.

## 2017-02-06 NOTE — Telephone Encounter (Signed)
WANTS HIS MEDICATIONS SENT MAIL ORDER, PLEASE CALL HIM

## 2017-02-06 NOTE — Telephone Encounter (Signed)
Patient's wife called requesting to switch to [facility] mail delivery & asked if will accept [facility]. Spoke to [employer] rep ([phone]) & was told they will not be able to accept [facility] payments & will be less costly for patient to use their local [facility]. Informed Nonie Hoyer. She said patient has enough meds & will call [facility] herself.She will [name] back for updates.

## 2017-02-07 ENCOUNTER — Telehealth: Payer: Self-pay

## 2017-02-07 NOTE — Telephone Encounter (Signed)
Questions about med. Please call back.  

## 2017-02-08 ENCOUNTER — Other Ambulatory Visit: Payer: Self-pay | Admitting: *Deleted

## 2017-02-08 DIAGNOSIS — I10 Essential (primary) hypertension: Secondary | ICD-10-CM

## 2017-02-08 MED ORDER — FUROSEMIDE 40 MG PO TABS: 20.0000 mg | ORAL_TABLET | Freq: Every day | ORAL | 1 refills | Status: DC

## 2017-02-08 MED ORDER — MIRTAZAPINE 15 MG PO TABS: 7.5000 mg | ORAL_TABLET | Freq: Every day | ORAL | 1 refills | Status: DC

## 2017-02-08 MED ORDER — HYDRALAZINE HCL 25 MG PO TABS: 25.0000 mg | ORAL_TABLET | Freq: Three times a day (TID) | ORAL | 1 refills | Status: DC

## 2017-02-08 MED ORDER — CLONIDINE HCL 0.3 MG PO TABS: 0.3000 mg | ORAL_TABLET | Freq: Three times a day (TID) | ORAL | 1 refills | Status: DC

## 2017-02-08 MED ORDER — WARFARIN SODIUM 5 MG PO TABS: 5.0000 mg | ORAL_TABLET | Freq: Every day | ORAL | 1 refills | Status: DC

## 2017-02-08 MED ORDER — DILTIAZEM HCL ER COATED BEADS 300 MG PO CP24: 300.0000 mg | ORAL_CAPSULE | Freq: Every day | ORAL | 1 refills | Status: DC

## 2017-02-08 NOTE — Telephone Encounter (Signed)
Spoke w/ pt and his caregiver, changing pharm services to mail order, will need all new scripts, request sent

## 2017-02-08 NOTE — Telephone Encounter (Signed)
I have reviewed [name]'s chart, I have provided refills for his chronic medications, however I cannot refill glimepiride as his diabetes has not been reassessed nor his renal function since feburary,  This is not a good medication for diabetes in CKD stage 4 and likely should be changed.  He really should come in for a visit sooner than September to address this.

## 2017-02-14 ENCOUNTER — Telehealth: Payer: Self-pay

## 2017-02-14 NOTE — Telephone Encounter (Signed)
Needs to speak with a nurse about meds. Please call pt back.  

## 2017-02-14 NOTE — Telephone Encounter (Signed)
Spoke w/ mrs Gores, she is not sure they can afford all his meds from the mail order pharmacy, she will make a list of things they cant afford and give them to dr kim via ph- she is not able to get out very much right now due to her own health issues.

## 2017-02-18 ENCOUNTER — Other Ambulatory Visit: Payer: Self-pay | Admitting: Internal Medicine

## 2017-02-19 ENCOUNTER — Ambulatory Visit (INDEPENDENT_AMBULATORY_CARE_PROVIDER_SITE_OTHER): Payer: Medicare Other | Admitting: Pharmacist

## 2017-02-19 DIAGNOSIS — Z7901 Long term (current) use of anticoagulants: Secondary | ICD-10-CM | POA: Diagnosis present

## 2017-02-19 DIAGNOSIS — Z8673 Personal history of transient ischemic attack (TIA), and cerebral infarction without residual deficits: Secondary | ICD-10-CM | POA: Diagnosis not present

## 2017-02-19 DIAGNOSIS — I6381 Other cerebral infarction due to occlusion or stenosis of small artery: Secondary | ICD-10-CM

## 2017-02-19 DIAGNOSIS — I480 Paroxysmal atrial fibrillation: Secondary | ICD-10-CM

## 2017-02-19 DIAGNOSIS — I6322 Cerebral infarction due to unspecified occlusion or stenosis of basilar arteries: Secondary | ICD-10-CM

## 2017-02-19 DIAGNOSIS — I63219 Cerebral infarction due to unspecified occlusion or stenosis of unspecified vertebral arteries: Secondary | ICD-10-CM

## 2017-02-19 LAB — POCT INR: INR: 2

## 2017-02-19 NOTE — Patient Instructions (Signed)
Patient educated about medication as defined in this encounter and verbalized understanding by repeating back instructions provided.   

## 2017-02-20 NOTE — Progress Notes (Signed)
Anticoagulation Management Patrick [name] is a 71 y.o. male who reports to the clinic for monitoring of warfarin treatment.    Indication: atrial fibrillation CHA2DS2 Vasc Score 7, HAS-BLED 4 Duration: indefinite Supervising physician: [facility] Visit History: Patient does not report signs/symptoms of bleeding or thromboembolism  Anticoagulation Episode Summary    Current INR goal:   2.0-3.0  TTR:   61.7 % (3.5 y)  Next INR check:   03/04/2017  INR from last check:   2.0 (02/19/2017)  Weekly max warfarin dose:     Target end date:   Indefinite  [facility]  Preferred lab:     Send INR reminders to:      Indications   ATRIAL FIBRILLATION PAROXYSMAL CHRONIC (Resolved) [I48.91] Acute ischemic VBA thalamic stroke (HCC) [V95.638 I63.22] Chronic anticoagulation on coumadin [Z79.01]       Comments:          ASSESSMENT Recent Results: The most recent result is correlated with 32.5 mg per week: Lab Results  Component Value Date   INR 2.0 02/19/2017   INR 2.2 01/14/2017   INR 1.2 12/31/2016   Anticoagulation Dosing: INR as of 02/19/2017 and Previous Warfarin Dosing Information    INR Dt INR Goal Patrick [name] Sun Mon Tue Wed Thu Fri Sat   02/19/2017 2.0 2.0-3.0 32.5 mg 5 mg 2.5 mg 5 mg 5 mg 5 mg 5 mg 5 mg    Previous description   Take 1 tablet by mouth once-daily at 6PM except take only 1/2 tablet on Mondays.    Anticoagulation Warfarin Dose Instructions as of 02/19/2017      Total Sun Mon Tue Wed Thu Fri Sat   New Dose 32.5 mg 5 mg 2.5 mg 5 mg 5 mg 5 mg 5 mg 5 mg     (5 mg x 1)  (5 mg x 0.5)  (5 mg x 1)  (5 mg x 1)  (5 mg x 1)  (5 mg x 1)  (5 mg x 1)                         Description   Take 1 tablet by mouth once-daily at 6PM except take only 1/2 tablet on Mondays.      INR today: Therapeutic  PLAN INR on the low end of therapeutic, but patient did take 1/2 tablet yesterday, will continue current dose and  follow up in 2 weeks  Patient Instructions  Patient educated about medication as defined in this encounter and verbalized understanding by repeating back instructions provided.   Patient advised to contact clinic or seek medical attention if signs/symptoms of bleeding or thromboembolism occur.  Patient verbalized understanding by repeating back information and was advised to contact me if further medication-related questions arise. Patient was also provided an information handout.  Follow-up Return in about 2 weeks (around 03/05/2017).  Patrick [name]

## 2017-02-23 NOTE — Progress Notes (Signed)
I reviewed Dr. Julianne Rice note.  INR at 2.  He is on coumadin for Afib.

## 2017-03-26 ENCOUNTER — Other Ambulatory Visit: Payer: Self-pay | Admitting: *Deleted

## 2017-03-26 MED ORDER — GLIMEPIRIDE 2 MG PO TABS: ORAL_TABLET | ORAL | 0 refills | Status: DC

## 2017-03-26 NOTE — Telephone Encounter (Signed)
Refilled 1 month supply, DM needs to be reassessed at 9/20 visit.

## 2017-04-13 IMAGING — DX DG FOOT COMPLETE 3+V*R*
3 series · 3 of 3 positions shown · non-contrast
Comparison: 06/05/2006

CLINICAL DATA: Foot pain and swelling, history of gout

EXAM:
RIGHT FOOT COMPLETE - 3+ VIEW

[foot obl]
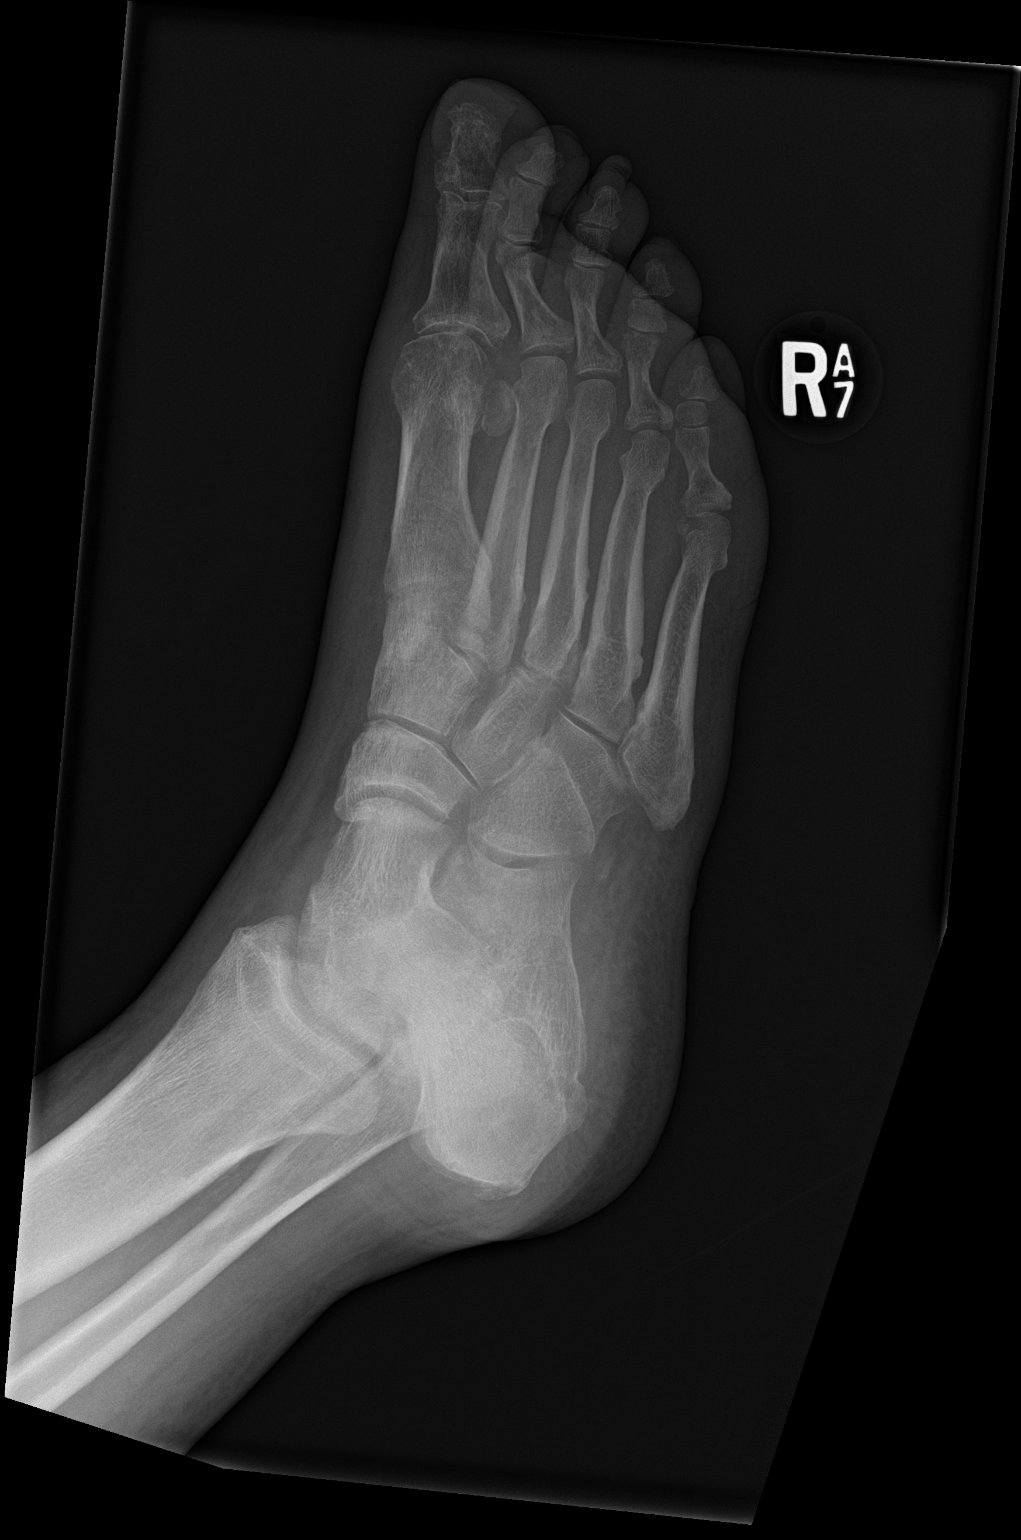

[foot lat]
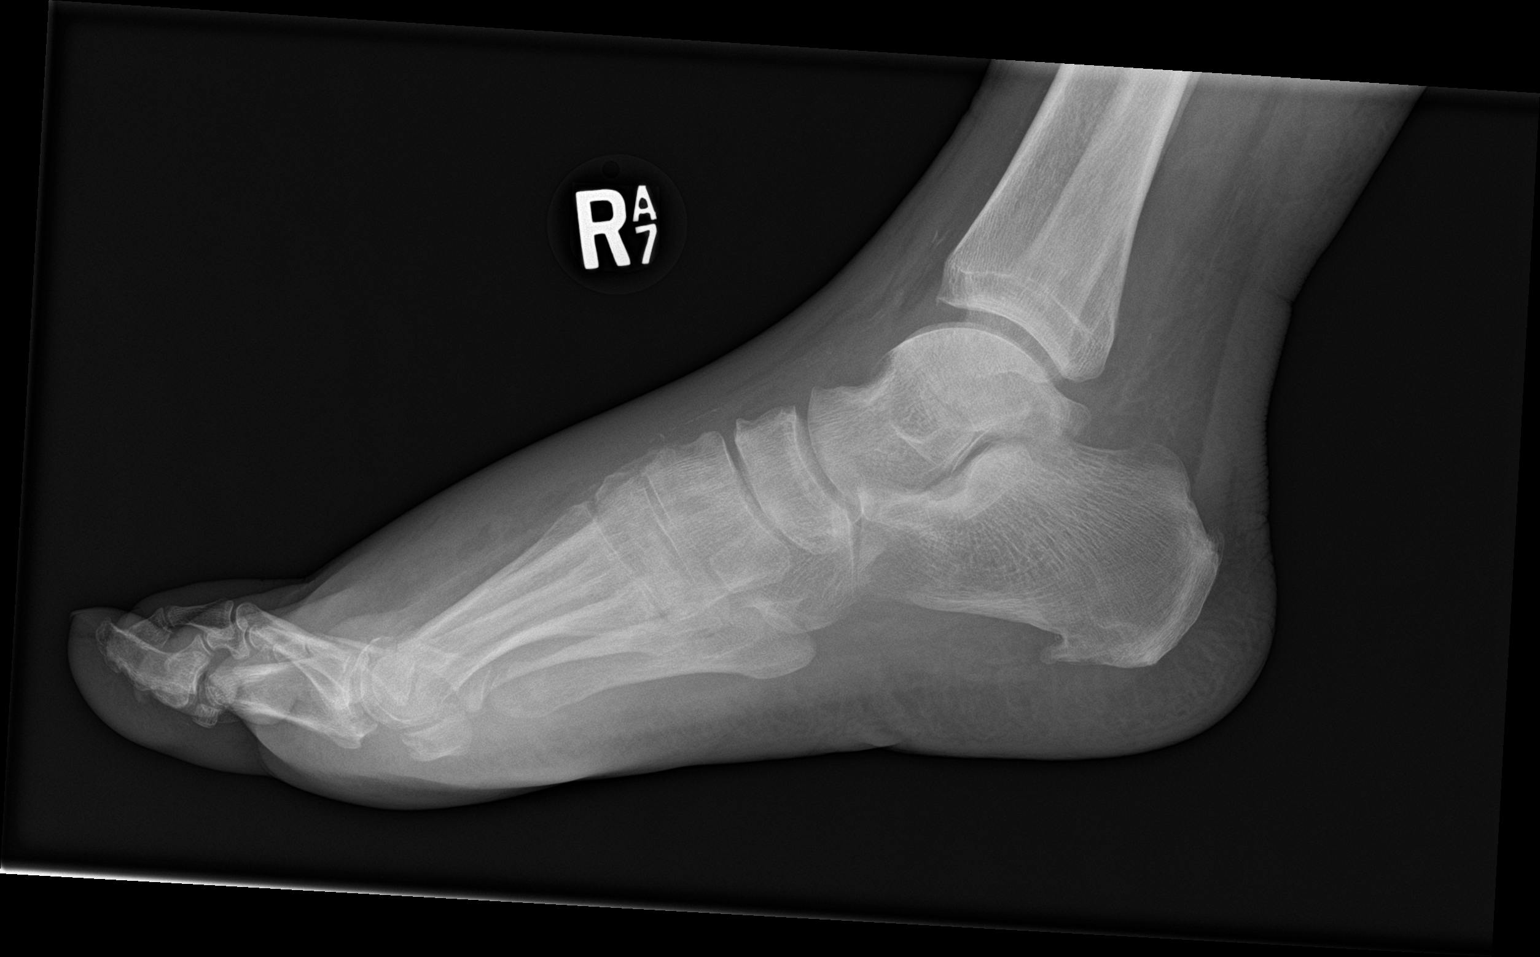

[foot ap]
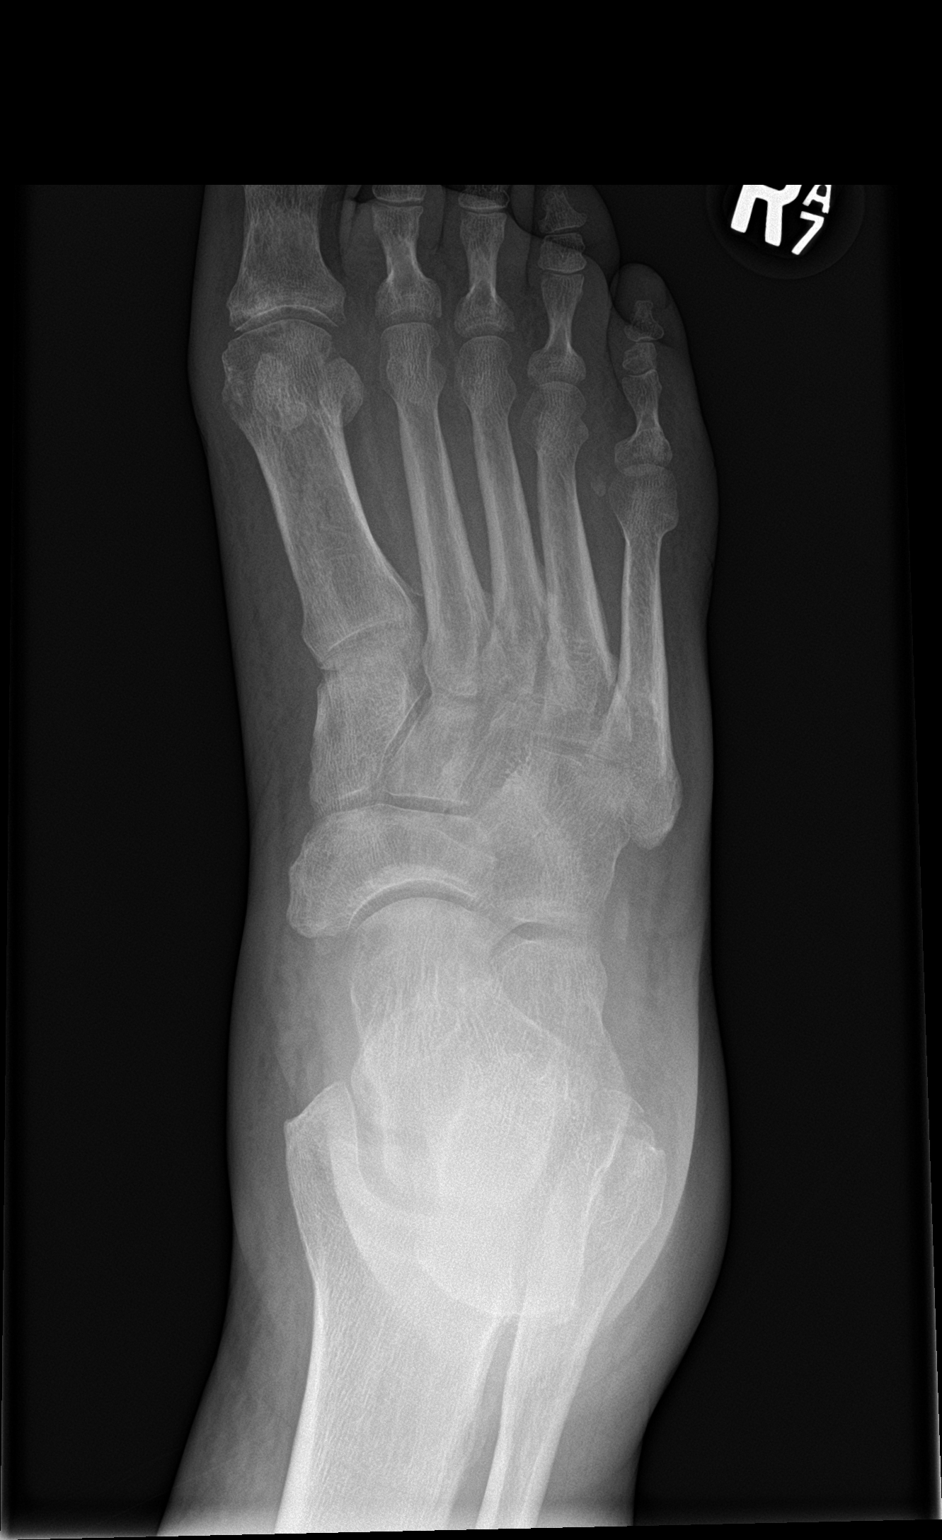

[3 of 3 positions shown; findings below may reference images not displayed]

FINDINGS: No acute fracture or dislocation is noted. Mild vascular
calcifications are seen. Calcaneal spurring is noted. Mild soft
tissue swelling is noted in the distal aspect of the foot. No gouty
tophi are seen. No erosive changes are noted.
IMPRESSION: Soft tissue swelling without acute bony abnormality.

## 2017-04-18 ENCOUNTER — Encounter: Payer: Medicare Other | Admitting: Internal Medicine

## 2017-04-22 ENCOUNTER — Other Ambulatory Visit: Payer: Self-pay | Admitting: Pharmacist

## 2017-04-22 DIAGNOSIS — F0150 Vascular dementia without behavioral disturbance: Secondary | ICD-10-CM

## 2017-04-22 DIAGNOSIS — E113419 Type 2 diabetes mellitus with severe nonproliferative diabetic retinopathy with macular edema, unspecified eye: Secondary | ICD-10-CM

## 2017-04-22 DIAGNOSIS — I4821 Permanent atrial fibrillation: Secondary | ICD-10-CM

## 2017-04-22 MED ORDER — DILTIAZEM HCL ER COATED BEADS 300 MG PO CP24: 300.0000 mg | ORAL_CAPSULE | Freq: Every day | ORAL | 0 refills | Status: DC

## 2017-04-22 MED ORDER — GLIMEPIRIDE 2 MG PO TABS: ORAL_TABLET | ORAL | 0 refills | Status: DC

## 2017-04-22 MED ORDER — DONEPEZIL HCL 5 MG PO TABS: 5.0000 mg | ORAL_TABLET | Freq: Every day | ORAL | 3 refills | Status: DC

## 2017-04-22 NOTE — Addendum Note (Signed)
Addended by: Forde Dandy on: 04/22/2017 05:02 PM   Modules accepted: Orders

## 2017-04-22 NOTE — Progress Notes (Signed)
Patient's wife called expressing confusion on how to refill patient's medications, and she states patient has run out of Cartia (diltiazem). Explained to patient's wife to contact [facility] to order refills before patient runs out of medication. Sent a 1-time prescription for [place] to [facility]. Patient's wife verbalized understanding.

## 2017-04-22 NOTE — Addendum Note (Signed)
Addended by: Forde Dandy on: 04/22/2017 05:39 PM   Modules accepted: Orders

## 2017-05-06 ENCOUNTER — Encounter (INDEPENDENT_AMBULATORY_CARE_PROVIDER_SITE_OTHER): Payer: Self-pay

## 2017-05-06 ENCOUNTER — Ambulatory Visit (INDEPENDENT_AMBULATORY_CARE_PROVIDER_SITE_OTHER): Payer: Medicare Other | Admitting: Pharmacist

## 2017-05-06 DIAGNOSIS — I6381 Other cerebral infarction due to occlusion or stenosis of small artery: Secondary | ICD-10-CM

## 2017-05-06 DIAGNOSIS — I63219 Cerebral infarction due to unspecified occlusion or stenosis of unspecified vertebral arteries: Secondary | ICD-10-CM

## 2017-05-06 DIAGNOSIS — Z7901 Long term (current) use of anticoagulants: Secondary | ICD-10-CM

## 2017-05-06 DIAGNOSIS — Z8673 Personal history of transient ischemic attack (TIA), and cerebral infarction without residual deficits: Secondary | ICD-10-CM | POA: Diagnosis not present

## 2017-05-06 DIAGNOSIS — I6322 Cerebral infarction due to unspecified occlusion or stenosis of basilar arteries: Secondary | ICD-10-CM

## 2017-05-06 LAB — POCT INR: INR: 2.1

## 2017-05-06 NOTE — Progress Notes (Signed)
Anticoagulation Management Patrick [name] is a 71 y.o. male who reports to the clinic for monitoring of warfarin treatment.    Indication: Atrial fibrillation, chronic, Acute ischemic VBA thalamic stroke ([place]), chronic anticoagulation.  Duration: indefinite Supervising physician: [place] [facility] Visit History: Patient does not report signs/symptoms of bleeding or thromboembolism  Other recent changes: No diet, medications, lifestyle changes endorsed by the patient to me.  Anticoagulation Episode Summary    Current INR goal:   2.0-3.0  TTR:   63.8 % (3.7 y)  Next INR check:   06/03/2017  INR from last check:   2.10 (05/06/2017)  Weekly max warfarin dose:     Target end date:   Indefinite  INR check location:   [facility]  Preferred lab:     Send INR reminders to:      Indications   ATRIAL FIBRILLATION PAROXYSMAL CHRONIC (Resolved) [I48.91] Acute ischemic VBA thalamic stroke (HCC) [Z61.096 I63.22] Chronic anticoagulation on coumadin [Z79.01]       Comments:           No Known Allergies Prior to Admission medications   Medication Sig Start Date End Date Taking? Authorizing Provider  acetaminophen (TYLENOL) 325 MG tablet Take 2 tablets (650 mg total) by mouth every 6 (six) hours as needed for moderate pain or headache. 12/08/15  Yes Love, [name], PA-C  cloNIDine (CATAPRES) 0.3 MG tablet Take 1 tablet (0.3 mg total) by mouth 3 (three) times daily. 02/08/17  Yes [name], DO  diclofenac sodium (VOLTAREN) 1 % GEL Apply 4 g topically 4 (four) times daily. 11/22/15  Yes Liberty Handy, MD  diltiazem (CARTIA XT) 300 MG 24 hr capsule Take 1 capsule (300 mg total) by mouth daily. 02/08/17  Yes [name], DO  docusate sodium (COLACE) 100 MG capsule Take 100 mg by mouth 2 (two) times daily.   Yes [provider]  donepezil (ARICEPT) 5 MG tablet Take 1 tablet (5 mg total) by mouth at bedtime. 04/22/17  Yes [name], MD  furosemide  (LASIX) 40 MG tablet Take 0.5 tablets (20 mg total) by mouth daily. 02/08/17  Yes [name], DO  glimepiride (AMARYL) 2 MG tablet Take 1 and 1/2 tablets by mouth daily with breakfast 04/22/17  Yes [name], MD  hydrALAZINE (APRESOLINE) 25 MG tablet Take 1 tablet (25 mg total) by mouth 3 (three) times daily. 02/08/17  Yes [name], DO  HYDROcodone-acetaminophen (NORCO/VICODIN) 5-325 MG tablet Take 1-2 tablets by mouth every 6 (six) hours as needed for moderate pain or severe pain. 10/22/16  Yes Duffy Bruce, MD  mirtazapine (REMERON) 15 MG tablet Take 0.5 tablets (7.5 mg total) by mouth at bedtime. 02/08/17  Yes [name], DO  predniSONE (STERAPRED UNI-PAK 21 TAB) 10 MG (21) TBPK tablet Take by mouth daily. Take 6 tabs by mouth daily  for 2 days, then 5 tabs for 2 days, then 4 tabs for 2 days, then 3 tabs for 2 days, 2 tabs for 2 days, then 1 tab by mouth daily for 2 days 10/22/16  Yes Duffy Bruce, MD  warfarin (COUMADIN) 5 MG tablet Take 1 tablet (5 mg total) by mouth daily at 6 PM. Except 1/2 tablet on mondays 02/08/17  Yes [name], DO   Past Medical History:  Diagnosis Date  . Atrial fibrillation ([place])   . CAD (coronary artery disease)   . Chronic anticoagulation, on coumadin 07/17/2013  . CKD (chronic kidney disease), stage  II   . 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 Depression   . Diabetes mellitus type II    Insulin dependent  . GERD (gastroesophageal reflux disease)   . Gout   . Hyperlipidemia   . Hypertension   . Myocardial infarction [facility]) 2000's   "near heart attack" (07/14/2013)  . Paroxysmal atrial fibrillation (HCC)    on coumadin  . Permanent atrial fibrillation ([place]) 07/17/2013  . Shortness of breath    "can happen at any time" (07/14/2013)  . Sleep apnea 07/2010   "has mask at home; seldom uses it" (07/14/2013)  . Stomach ulcer 1950's   "as a teenager"  . Stroke [facility]) ~ 2007; ~ 2009    "memory not as good since" (07/14/2013)  . Tachy-brady syndrome ([place]) 07/17/2013  . Upper GI bleeding 07/01/2013   Social History   Social History  . Marital status: Married    [name]  . Number of children: N/A  . Years of education: 5   Occupational History  . retired Optometrist   Social History Main Topics  . Smoking status: Former Smoker    Packs/day: 0.50    Years: 15.00    Types: Cigarettes    Quit date: 10/26/1976  . Smokeless tobacco: Former Systems developer     Comment: 07/14/2013 "quit chewing and dipping in ~ 1970"  . Alcohol use No     Comment: 07/14/2013 "drank a little; quit in ~ 1970; never had problem w/it"  . Drug use: No  . Sexual activity: Not Currently   Other Topics Concern  . Not on file   Social History Narrative   Admitted to [facility] 12/09/15   [name]   Former smoker - stopped 1978   Alcohol stopped 1970   Full Code   Family History  Problem Relation Age of Onset  . Diabetes Mother   . Hypertension Mother   . Heart Problems Father   . Cancer Brother   . Hypertension Sister   . Diabetes Sister     ASSESSMENT Recent Results: The most recent result is correlated with 32.5 mg per week: Lab Results  Component Value Date   INR 2.10 05/06/2017   INR 2.0 02/19/2017   INR 2.2 01/14/2017    Anticoagulation Dosing: INR as of 05/06/2017 and Previous Warfarin Dosing Information    INR Dt INR Goal Wkly Tot Sun Mon Tue Wed Thu Fri Sat   05/06/2017 2.10 2.0-3.0 32.5 mg 5 mg 2.5 mg 5 mg 5 mg 5 mg 5 mg 5 mg    Previous description   Take 1 tablet by mouth once-daily at 6PM except take only 1/2 tablet on Mondays.    Anticoagulation Warfarin Dose Instructions as of 05/06/2017      Total Sun Mon Tue Wed Thu Fri Sat   New Dose 35 mg 5 mg 5 mg 5 mg 5 mg 5 mg 5 mg 5 mg     (5 mg x 1)  (5 mg x 1)  (5 mg x 1)  (5 mg x 1)  (5 mg x 1)  (5 mg x 1)  (5 mg x 1)                         Description   Take 1 tablet by mouth  once-daily at 6PM.      INR today: Therapeutic  PLAN Weekly dose was increased by  7% to 35 mg per week  Patient Instructions  Patient instructed to take medications as defined in the Anti-coagulation Track section of this encounter.  Patient instructed to take today's dose.  Patient instructed to take 1 tablet of your 5mg  peach-colored warfarin tablets by mouth once-daily at [facility].  Patient verbalized understanding of these instructions.     Patient advised to contact clinic or seek medical attention if signs/symptoms of bleeding or thromboembolism occur.  Patient verbalized understanding by repeating back information and was advised to contact me if further medication-related questions arise. Patient was also provided an information handout.  Follow-up Return in about 4 weeks (around 06/03/2017) for Follow up INR at 1130.  Pennie Banter, PharmD, CACP, CPP  15 minutes spent face-to-face with the patient during the encounter. 50% of time spent on education. 50% of time was spent on fingerstick point of care INR sample collection, processing, results determination, dose adjustment and documentation in [email or web address].

## 2017-05-06 NOTE — Patient Instructions (Signed)
Patient instructed to take medications as defined in the Anti-coagulation Track section of this encounter.  Patient instructed to take today's dose.  Patient instructed to take 1 tablet of your 5mg  peach-colored warfarin tablets by mouth once-daily at [facility].  Patient verbalized understanding of these instructions.

## 2017-05-21 ENCOUNTER — Other Ambulatory Visit: Payer: Self-pay | Admitting: Internal Medicine

## 2017-05-21 DIAGNOSIS — I4821 Permanent atrial fibrillation: Secondary | ICD-10-CM

## 2017-05-21 NOTE — Telephone Encounter (Signed)
Needs appt 30 days

## 2017-05-21 NOTE — Telephone Encounter (Signed)
Needs appt as Dr Heber [place] rec 7/13. One month supply only.  HTN DM appt next 30 days

## 2017-05-21 NOTE — Telephone Encounter (Signed)
Please per dr Lynnae January make appt within 30 days

## 2017-05-24 NOTE — Addendum Note (Signed)
Addended by: Forde Dandy on: 05/24/2017 03:40 PM   Modules accepted: Orders

## 2017-05-30 ENCOUNTER — Telehealth: Payer: Self-pay | Admitting: Pharmacist

## 2017-06-03 ENCOUNTER — Ambulatory Visit: Payer: Medicare Other

## 2017-06-03 ENCOUNTER — Telehealth: Payer: Self-pay | Admitting: *Deleted

## 2017-06-03 NOTE — Telephone Encounter (Signed)
Patient & wife walked in having questions regarding refill for Mirtazapine & Cartia. 22-month supply [facility]/17 then needs f/u appt. Rx were never picked up from pharmacy. Called [facility] & was informed meds are ready. Scheduled for [facility] 06/10/17.

## 2017-06-10 ENCOUNTER — Ambulatory Visit (INDEPENDENT_AMBULATORY_CARE_PROVIDER_SITE_OTHER): Payer: Medicare Other | Admitting: Internal Medicine

## 2017-06-10 ENCOUNTER — Other Ambulatory Visit: Payer: Self-pay

## 2017-06-10 ENCOUNTER — Ambulatory Visit (INDEPENDENT_AMBULATORY_CARE_PROVIDER_SITE_OTHER): Payer: Medicare Other | Admitting: Pharmacist

## 2017-06-10 VITALS — BP 215/79 | HR 66 | Temp 97.7°F | Ht 68.0 in | Wt 192.2 lb

## 2017-06-10 DIAGNOSIS — I129 Hypertensive chronic kidney disease with stage 1 through stage 4 chronic kidney disease, or unspecified chronic kidney disease: Secondary | ICD-10-CM

## 2017-06-10 DIAGNOSIS — E1122 Type 2 diabetes mellitus with diabetic chronic kidney disease: Secondary | ICD-10-CM

## 2017-06-10 DIAGNOSIS — I10 Essential (primary) hypertension: Secondary | ICD-10-CM

## 2017-06-10 DIAGNOSIS — Z7901 Long term (current) use of anticoagulants: Secondary | ICD-10-CM

## 2017-06-10 DIAGNOSIS — I6322 Cerebral infarction due to unspecified occlusion or stenosis of basilar arteries: Secondary | ICD-10-CM

## 2017-06-10 DIAGNOSIS — I63219 Cerebral infarction due to unspecified occlusion or stenosis of unspecified vertebral arteries: Secondary | ICD-10-CM | POA: Diagnosis present

## 2017-06-10 DIAGNOSIS — Z760 Encounter for issue of repeat prescription: Secondary | ICD-10-CM | POA: Diagnosis not present

## 2017-06-10 DIAGNOSIS — E113419 Type 2 diabetes mellitus with severe nonproliferative diabetic retinopathy with macular edema, unspecified eye: Secondary | ICD-10-CM

## 2017-06-10 DIAGNOSIS — N184 Chronic kidney disease, stage 4 (severe): Secondary | ICD-10-CM

## 2017-06-10 DIAGNOSIS — E785 Hyperlipidemia, unspecified: Secondary | ICD-10-CM | POA: Diagnosis not present

## 2017-06-10 DIAGNOSIS — I2510 Atherosclerotic heart disease of native coronary artery without angina pectoris: Secondary | ICD-10-CM | POA: Diagnosis not present

## 2017-06-10 DIAGNOSIS — I252 Old myocardial infarction: Secondary | ICD-10-CM

## 2017-06-10 DIAGNOSIS — I6381 Other cerebral infarction due to occlusion or stenosis of small artery: Secondary | ICD-10-CM

## 2017-06-10 LAB — GLUCOSE, CAPILLARY: Glucose-Capillary: 299 mg/dL — ABNORMAL HIGH (ref 65–99)

## 2017-06-10 LAB — POCT GLYCOSYLATED HEMOGLOBIN (HGB A1C): HEMOGLOBIN A1C: 9.4

## 2017-06-10 LAB — POCT INR: INR: 1.8

## 2017-06-10 MED ORDER — ONETOUCH VERIO W/DEVICE KIT: 1.0000 | PACK | Freq: Four times a day (QID) | 0 refills | Status: DC

## 2017-06-10 MED ORDER — INSULIN DETEMIR 100 UNIT/ML ~~LOC~~ SOLN: 20.0000 [IU] | Freq: Every day | SUBCUTANEOUS | 0 refills | Status: DC

## 2017-06-10 MED ORDER — GLUCOSE BLOOD VI STRP: ORAL_STRIP | 12 refills | Status: DC

## 2017-06-10 MED ORDER — ONETOUCH DELICA LANCETS 33G MISC: 1.0000 | Freq: Three times a day (TID) | 11 refills | Status: DC

## 2017-06-10 MED ORDER — DILTIAZEM HCL ER COATED BEADS 300 MG PO CP24: 300.0000 mg | ORAL_CAPSULE | Freq: Every day | ORAL | 0 refills | Status: DC

## 2017-06-10 MED ORDER — HYDRALAZINE HCL 50 MG PO TABS: 50.0000 mg | ORAL_TABLET | Freq: Three times a day (TID) | ORAL | 0 refills | Status: DC

## 2017-06-10 MED ORDER — FUROSEMIDE 40 MG PO TABS: 20.0000 mg | ORAL_TABLET | Freq: Every day | ORAL | 0 refills | Status: DC

## 2017-06-10 MED ORDER — CLONIDINE HCL 0.3 MG PO TABS: 0.3000 mg | ORAL_TABLET | Freq: Three times a day (TID) | ORAL | 0 refills | Status: DC

## 2017-06-10 NOTE — Progress Notes (Signed)
   CC: Medication refill  HPI:  [name] is a 71 y.o. male with a past medical history listed below here today requesting medication refill for her HTN, DM, CAD, PAF.   For details of today's visit and the status of her chronic medical issues please refer to the assessment and plan.  Past Medical History:  Diagnosis Date  . Atrial fibrillation ([place])   . CAD (coronary artery disease)   . Chronic anticoagulation, on coumadin 07/17/2013  . CKD (chronic kidney disease), stage II   . 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 Depression   . Diabetes mellitus type II    Insulin dependent  . GERD (gastroesophageal reflux disease)   . Gout   . Hyperlipidemia   . Hypertension   . Myocardial infarction [facility]) 2000's   "near heart attack" (07/14/2013)  . Paroxysmal atrial fibrillation (HCC)    on coumadin  . Permanent atrial fibrillation ([facility]) 07/17/2013  . Shortness of breath    "can happen at any time" (07/14/2013)  . Sleep apnea 07/2010   "has mask at home; seldom uses it" (07/14/2013)  . Stomach ulcer 1950's   "as a teenager"  . Stroke [facility]) ~ 2007; ~ 2009   "memory not as good since" (07/14/2013)  . Tachy-brady syndrome ([place]) 07/17/2013  . Upper GI bleeding 07/01/2013   Review of Systems:   Review of Systems  Constitutional: Negative for chills, fever and malaise/fatigue.  Respiratory: Negative for shortness of breath.   Cardiovascular: Negative for chest pain and leg swelling.  Gastrointestinal: Negative for abdominal pain, diarrhea, nausea and vomiting.  Skin: Negative for itching and rash.  Neurological: Negative for dizziness, speech change, focal weakness and headaches.   Physical Exam:  Vitals:   06/10/17 1019  BP: (!) 215/79  Pulse: 66  Temp: 97.7 F (36.5 C)  TempSrc: Oral  SpO2: 100%  Weight: 192 lb 3.2 oz (87.2 kg)  Height: 5\' 8"  (1.727 m)   Physical Exam  Constitutional: He is oriented to person,  place, and time and well-developed, well-nourished, and in no distress. No distress.  HENT:  Head: Normocephalic and atraumatic.  Eyes: Pupils are equal, round, and reactive to light.  Neck: No JVD present.  Cardiovascular: Normal rate, regular rhythm and intact distal pulses. Exam reveals no gallop and no friction rub.  Murmur heard. Systolic murmur  Pulmonary/Chest: Effort normal and breath sounds normal. He has no wheezes. He has no rales.  Abdominal: Soft. Bowel sounds are normal. He exhibits no distension. There is no tenderness.  Musculoskeletal:  1+ edema to ankle bilaterally  Neurological: He is alert and oriented to person, place, and time.  Skin: Skin is dry. No rash noted. He is not diaphoretic.  Psychiatric: Mood and affect normal.    Assessment & Plan:   See Encounters Tab for problem based charting.  Patient discussed with Dr. Angelia Mould

## 2017-06-10 NOTE — Patient Instructions (Signed)
Patient instructed to take medications as defined in the Anti-coagulation Track section of this encounter.  Patient instructed to take today's dose.  Patient instructed to take 1 tablet by mouth once-daily at 6PM all days of week--EXCEPT on Mondays and Thursdays, take 1&1/2 tablets of your 5mg  peach-colored warfarin tablets on Mondays and Thursdays. Patient verbalized understanding of these instructions.

## 2017-06-10 NOTE — Assessment & Plan Note (Addendum)
BP Readings from Last 3 Encounters:  06/10/17 (!) 215/79  10/22/16 (!) 185/78  10/11/16 (!) 164/71    Lab Results  Component Value Date   NA 138 10/22/2016   K 3.5 10/22/2016   CREATININE 2.99 (H) 10/22/2016   Patient with a history of uncontrolled HTN. Last visit on 09/17/16 she was noted to have worsening renal function with Cr 2.9 > 4.8 (GFR 24>13). His losartan was stopped at that time. Repeat blood work from 09/2016 shows that her Cr improved to 3 on repeat.   Currently she is prescribed Hydralazine 25 mg bid, Diltiazem ER 300 mg daily, Clonidine 0.3 mg tid, and Lasix 20 mg daily.   Today, her BP is 215/79. He denies any complaints today. Reports compliance with his medications. Denies any headaches, vision changes, nausea, vomiting, chest pain, or shortness of breath.   A/P: Increase hydralazine to 50 mg tid. Continue other mediations as before. Checking BMET today. Follow up in 2 weeks.

## 2017-06-10 NOTE — Progress Notes (Signed)
Unable to reeach

## 2017-06-10 NOTE — Progress Notes (Signed)
Anticoagulation Management Patrick [name] is a 71 y.o. male who reports to the clinic for monitoring of warfarin treatment.    Indication: atrial fibrillation   Duration: indefinite Supervising physician: Joni Reining  Anticoagulation Clinic Visit History: Patient does not report signs/symptoms of bleeding or thromboembolism  Other recent changes: No diet, medications, lifestyle changes endorsed by the patient to me.  Anticoagulation Episode Summary    Current INR goal:   2.0-3.0  TTR:   63.1 % (3.8 y)  Next INR check:   06/24/2017  INR from last check:   1.80! (06/10/2017)  Weekly max warfarin dose:     Target end date:   Indefinite  INR check location:   [facility]  Preferred lab:     Send INR reminders to:      Indications   ATRIAL FIBRILLATION PAROXYSMAL CHRONIC (Resolved) [I48.91] Acute ischemic VBA thalamic stroke (HCC) [Z12.458 I63.22] Chronic anticoagulation on coumadin [Z79.01]       Comments:           No Known Allergies Prior to Admission medications   Medication Sig Start Date End Date Taking? Authorizing Provider  acetaminophen (TYLENOL) 325 MG tablet Take 2 tablets (650 mg total) by mouth every 6 (six) hours as needed for moderate pain or headache. 12/08/15   Love, [name], PA-C  cloNIDine (CATAPRES) 0.3 MG tablet Take 1 tablet (0.3 mg total) 3 (three) times daily by mouth. 06/10/17   [name], MD  diclofenac sodium (VOLTAREN) 1 % GEL Apply 4 g topically 4 (four) times daily. 11/22/15   Liberty Handy, MD  diltiazem (CARTIA XT) 300 MG 24 hr capsule Take 1 capsule (300 mg total) daily by mouth. 06/10/17   [name], MD  docusate sodium (COLACE) 100 MG capsule Take 100 mg by mouth 2 (two) times daily.    [provider]  donepezil (ARICEPT) 5 MG tablet Take 1 tablet (5 mg total) by mouth at bedtime. 04/22/17   [name], MD  furosemide (LASIX) 40 MG tablet Take 0.5 tablets (20 mg total) daily by mouth. 06/10/17   [name], MD   hydrALAZINE (APRESOLINE) 50 MG tablet Take 1 tablet (50 mg total) 3 (three) times daily by mouth. 06/10/17   [name], MD  insulin detemir (LEVEMIR) 100 UNIT/ML injection Inject 0.2 mLs (20 Units total) at bedtime into the skin. 06/10/17   [name], MD  mirtazapine (REMERON) 15 MG tablet Take 0.5 tablets (7.5 mg total) by mouth at bedtime. 02/08/17   [name], DO  warfarin (COUMADIN) 5 MG tablet Take 1 tablet (5 mg total) by mouth daily at 6 PM. Except 1/2 tablet on mondays 02/08/17   [name], DO   Past Medical History:  Diagnosis Date  . Atrial fibrillation ([place])   . CAD (coronary artery disease)   . Chronic anticoagulation, on coumadin 07/17/2013  . CKD (chronic kidney disease), stage II   . 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 Depression   . Diabetes mellitus type II    Insulin dependent  . GERD (gastroesophageal reflux disease)   . Gout   . Hyperlipidemia   . Hypertension   . Myocardial infarction [facility]) 2000's   "near heart attack" (07/14/2013)  . Paroxysmal atrial fibrillation (HCC)    on coumadin  . Permanent atrial fibrillation ([place]) 07/17/2013  . Shortness of breath    "can happen at any time" (07/14/2013)  . Sleep apnea 07/2010   "  has mask at home; seldom uses it" (07/14/2013)  . Stomach ulcer 1950's   "as a teenager"  . Stroke [facility]) ~ 2007; ~ 2009   "memory not as good since" (07/14/2013)  . Tachy-brady syndrome ([place]) 07/17/2013  . Upper GI bleeding 07/01/2013   Social History   Socioeconomic History  . Marital status: Married    Spouse name: Not on file  . Number of children: Not on file  . Years of education: 5  . Highest education level: Not on file  Social Needs  . Financial resource strain: Not on file  . Food insecurity - worry: Not on file  . Food insecurity - inability: Not on file  . Transportation needs - medical: Not on file  . Transportation needs - non-medical: Not  on file  Occupational History  . 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 Smoking status: Former Smoker    Packs/day: 0.50    Years: 15.00    Pack years: 7.50    Types: Cigarettes    Last attempt to quit: 10/26/1976    Years since quitting: 40.6  . Smokeless tobacco: Former Systems developer  . Tobacco comment: 07/14/2013 "quit chewing and dipping in ~ 1970"  Substance and Sexual Activity  . Alcohol use: No    Alcohol/week: 0.0 oz    Comment: 07/14/2013 "drank a little; quit in ~ 1970; never had problem w/it"  . Drug use: No  . Sexual activity: Not Currently  Other Topics Concern  . Not on file  Social History Narrative   Admitted to [facility] 12/09/15   [name]   Former smoker - stopped 1978   Alcohol stopped 1970   Full Code   Family History  Problem Relation Age of Onset  . Diabetes Mother   . Hypertension Mother   . Heart Problems Father   . Cancer Brother   . Hypertension Sister   . Diabetes Sister     ASSESSMENT Recent Results: The most recent result is correlated with 35 mg per week: Lab Results  Component Value Date   INR 1.80 06/10/2017   INR 2.10 05/06/2017   INR 2.0 02/19/2017    Anticoagulation Dosing: Description   Take 1 tablet by mouth once-daily at 6PM all days of week--EXCEPT on Mondays and Thursdays, take 1&1/2 tablets of your 5mg  peach-colored warfarin tablets on Mondays and Thursdays.      INR today: Subtherapeutic  PLAN Weekly dose was increased by 14% to 40 mg per week  Patient Instructions  Patient instructed to take medications as defined in the Anti-coagulation Track section of this encounter.  Patient instructed to take today's dose.  Patient instructed to take 1 tablet by mouth once-daily at 6PM all days of week--EXCEPT on Mondays and Thursdays, take 1&1/2 tablets of your 5mg  peach-colored warfarin tablets on Mondays and Thursdays. Patient verbalized understanding of these instructions.     Patient  advised to contact clinic or seek medical attention if signs/symptoms of bleeding or thromboembolism occur.  Patient verbalized understanding by repeating back information and was advised to contact me if further medication-related questions arise. Patient was also provided an information handout.  Follow-up Return in 2 weeks (on 06/24/2017) for Follow up INR at 1030h.  Pennie Banter, PharmD, CACP, CPP  15 minutes spent face-to-face with the patient during the encounter. 50% of time spent on education. 50% of time was spent on fingerstick point of care INR sample collection, processing, results determination, dose adjustment  and documentation in Epic/CHL and [email or web address] .

## 2017-06-10 NOTE — Assessment & Plan Note (Addendum)
Has not established with nephrology. Had frank discussion with him regarding his renal function and likelihood of needing dialysis. He voiced understanding. Stressed the importance of following up with nephrology. He continues to deny any uremia symptoms.   A/P Checking renal function panel, PTH, CBC today. Re-referring to nephology.

## 2017-06-10 NOTE — Assessment & Plan Note (Addendum)
Lab Results  Component Value Date   HGBA1C 9.7 09/17/2016   HGBA1C 8.5 (H) 11/27/2015   HGBA1C 7.7 08/29/2015    Most recent A1c was 9.7 on 08/2016. Currently only taking glimepiride 2 mg daily. A1c today stable at 9.5 though suspect this is underestimated with his renal function progressing to CKD 5.    A/P: Will d/c glimepiride today with his renal function. Will start Levemir 20 units qhs. Follow up in 2 weeks for re-evaluation.

## 2017-06-10 NOTE — Patient Instructions (Addendum)
Patrick [name],  Your blood pressure is very elevated today.  I am going to increase the dose of your Hyrdalazine to 50 mg three times a day. Your current pills are 25 mg so you can take 2 of these three times a day; your new prescription will be the 50 mg pills so when you get it filled you can go back to talking only one pill.  I am stopping your Amaryl. I am going to send in a prescription for Levemir inulin. Take 20 units at night before bed. Please get your meter and testing supplies filled and check your blood sugars 3 times a day. If you are having side effects or low blood sugars, please call the clinic.  It is going to be very important to get set up with the kidney doctors a swell. We will call you with an appointment.  Please follow up with me in clinic in 2 weeks.

## 2017-06-10 NOTE — Progress Notes (Signed)
Internal Medicine Clinic Attending  Case discussed with Dr. Boswell at the time of the visit.  We reviewed the resident's history and exam and pertinent patient test results.  I agree with the assessment, diagnosis, and plan of care documented in the resident's note.  

## 2017-06-10 NOTE — Addendum Note (Signed)
Addended by: Forde Dandy on: 06/10/2017 04:08 PM   Modules accepted: Orders

## 2017-06-11 ENCOUNTER — Other Ambulatory Visit: Payer: Self-pay

## 2017-06-11 LAB — RENAL FUNCTION PANEL
Albumin: 3.7 g/dL (ref 3.6–4.8)
BUN / CREAT RATIO: 12 (ref 10–24)
BUN: 31 mg/dL — AB (ref 8–27)
CO2: 22 mmol/L (ref 20–29)
CREATININE: 2.64 mg/dL — AB (ref 0.76–1.27)
Calcium: 8.9 mg/dL (ref 8.6–10.2)
Chloride: 102 mmol/L (ref 96–106)
GFR, EST AFRICAN AMERICAN: 27 mL/min/{1.73_m2} — AB (ref >59)
GFR, EST NON AFRICAN AMERICAN: 24 mL/min/{1.73_m2} — AB (ref >59)
Glucose: 318 mg/dL — ABNORMAL HIGH (ref 65–99)
Phosphorus: 2.7 mg/dL (ref 2.5–4.5)
Potassium: 4 mmol/L (ref 3.5–5.2)
Sodium: 141 mmol/L (ref 134–144)

## 2017-06-11 LAB — CBC
HEMATOCRIT: 36.1 % — AB (ref 37.5–51.0)
Hemoglobin: 10.9 g/dL — ABNORMAL LOW (ref 13.0–17.7)
MCH: 25 pg — ABNORMAL LOW (ref 26.6–33.0)
MCHC: 30.2 g/dL — AB (ref 31.5–35.7)
MCV: 83 fL (ref 79–97)
Platelets: 201 10*3/uL (ref 150–379)
RBC: 4.36 x10E6/uL (ref 4.14–5.80)
RDW: 15.5 % — AB (ref 12.3–15.4)
WBC: 5.6 10*3/uL (ref 3.4–10.8)

## 2017-06-11 LAB — PTH, INTACT AND CALCIUM
Calcium: 8.9 mg/dL (ref 8.6–10.2)
PTH: 361 pg/mL — ABNORMAL HIGH (ref 15–65)

## 2017-06-11 NOTE — Telephone Encounter (Signed)
warfarin (COUMADIN) 5 MG tablet, refill request @ [facility] on [address].

## 2017-06-12 MED ORDER — WARFARIN SODIUM 5 MG PO TABS: ORAL_TABLET | ORAL | 2 refills | Status: DC

## 2017-06-12 NOTE — Progress Notes (Signed)
INTERNAL MEDICINE TEACHING ATTENDING ADDENDUM - [name], DO Duration- indefinate, Indication- a fib, INR-  Lab Results  Component Value Date   INR 1.80 06/10/2017  . Agree with pharmacy recommendations as outlined in their note.

## 2017-06-13 ENCOUNTER — Other Ambulatory Visit: Payer: Self-pay | Admitting: *Deleted

## 2017-06-13 DIAGNOSIS — E113419 Type 2 diabetes mellitus with severe nonproliferative diabetic retinopathy with macular edema, unspecified eye: Secondary | ICD-10-CM

## 2017-06-13 MED ORDER — GLUCOSE BLOOD VI STRP: ORAL_STRIP | 12 refills | Status: DC

## 2017-06-24 ENCOUNTER — Ambulatory Visit: Payer: Medicare Other

## 2017-07-01 ENCOUNTER — Telehealth: Payer: Self-pay | Admitting: Dietician

## 2017-07-01 NOTE — Telephone Encounter (Addendum)
Wife called to find.out what happened at her husband's last visit, she was sick and could not attend and her husband couldn't tell her what happened. She got medicine deliveries and was not sure why. It was 48.00 and she was not sure they could afford it.   I offered pharmacy assistance, but she denied need at this time. After she gave me his birth date, I read the last after visit summary to her and she verbalized understanding why they got the medicine and that they needed to pick his meter up at [place]. She said he is refusing to take insulin and plans to continue amaryl. I mailed the AVS to Mr. Leavell.

## 2017-07-02 ENCOUNTER — Other Ambulatory Visit: Payer: Self-pay | Admitting: Internal Medicine

## 2017-07-02 DIAGNOSIS — I4821 Permanent atrial fibrillation: Secondary | ICD-10-CM

## 2017-07-03 NOTE — Telephone Encounter (Signed)
Please schedule an appt w/PCP. Thanks

## 2017-08-03 ENCOUNTER — Other Ambulatory Visit: Payer: Self-pay | Admitting: Internal Medicine

## 2017-08-05 ENCOUNTER — Telehealth: Payer: Self-pay | Admitting: Internal Medicine

## 2017-08-05 ENCOUNTER — Ambulatory Visit (INDEPENDENT_AMBULATORY_CARE_PROVIDER_SITE_OTHER): Payer: Medicare Other | Admitting: Pharmacist

## 2017-08-05 ENCOUNTER — Encounter: Payer: Self-pay | Admitting: Internal Medicine

## 2017-08-05 ENCOUNTER — Other Ambulatory Visit: Payer: Self-pay

## 2017-08-05 ENCOUNTER — Ambulatory Visit (INDEPENDENT_AMBULATORY_CARE_PROVIDER_SITE_OTHER): Payer: Medicare Other | Admitting: Internal Medicine

## 2017-08-05 DIAGNOSIS — R350 Frequency of micturition: Secondary | ICD-10-CM | POA: Diagnosis not present

## 2017-08-05 DIAGNOSIS — Z79899 Other long term (current) drug therapy: Secondary | ICD-10-CM

## 2017-08-05 DIAGNOSIS — Z87891 Personal history of nicotine dependence: Secondary | ICD-10-CM

## 2017-08-05 DIAGNOSIS — Z7901 Long term (current) use of anticoagulants: Secondary | ICD-10-CM

## 2017-08-05 DIAGNOSIS — I6381 Other cerebral infarction due to occlusion or stenosis of small artery: Secondary | ICD-10-CM

## 2017-08-05 DIAGNOSIS — Z8673 Personal history of transient ischemic attack (TIA), and cerebral infarction without residual deficits: Secondary | ICD-10-CM | POA: Diagnosis not present

## 2017-08-05 DIAGNOSIS — I6322 Cerebral infarction due to unspecified occlusion or stenosis of basilar arteries: Secondary | ICD-10-CM

## 2017-08-05 DIAGNOSIS — N289 Disorder of kidney and ureter, unspecified: Secondary | ICD-10-CM

## 2017-08-05 DIAGNOSIS — I63219 Cerebral infarction due to unspecified occlusion or stenosis of unspecified vertebral arteries: Secondary | ICD-10-CM

## 2017-08-05 DIAGNOSIS — I480 Paroxysmal atrial fibrillation: Secondary | ICD-10-CM | POA: Diagnosis present

## 2017-08-05 DIAGNOSIS — I10 Essential (primary) hypertension: Secondary | ICD-10-CM

## 2017-08-05 LAB — POCT INR: INR: 2.1

## 2017-08-05 NOTE — Progress Notes (Signed)
Internal Medicine Clinic Attending  Case discussed with Dr. Hoffman at the time of the visit.  We reviewed the resident's history and exam and pertinent patient test results.  I agree with the assessment, diagnosis, and plan of care documented in the resident's note.  

## 2017-08-05 NOTE — Telephone Encounter (Signed)
Talked to pt's wife -clarification of Hydralazine increased to 50 mg TID per Dr Charlynn Grimes and f/u in 2 weeks. Wife stated concerned pt having increased urine output - day and night. Appt w/nephrology is 08/15/17. Scheduled f/u appt for today in [place] to re-check BP.

## 2017-08-05 NOTE — Progress Notes (Signed)
   CC: Follow-up on uncontrolled hypertension  HPI:  [name] is a 72 y.o. male with history noted below that presents to the acute care clinic for follow-up on uncontrolled hypertension. Please see problem based charting for the status of patient's chronic medical conditions.  Past Medical History:  Diagnosis Date  . Atrial fibrillation ([place])   . CAD (coronary artery disease)   . Chronic anticoagulation, on coumadin 07/17/2013  . CKD (chronic kidney disease), stage II   . 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 Depression   . Diabetes mellitus type II    Insulin dependent  . GERD (gastroesophageal reflux disease)   . Gout   . Hyperlipidemia   . Hypertension   . Myocardial infarction [facility]) 2000's   "near heart attack" (07/14/2013)  . Paroxysmal atrial fibrillation (HCC)    on coumadin  . Permanent atrial fibrillation ([place]) 07/17/2013  . Shortness of breath    "can happen at any time" (07/14/2013)  . Sleep apnea 07/2010   "has mask at home; seldom uses it" (07/14/2013)  . Stomach ulcer 1950's   "as a teenager"  . Stroke [facility]) ~ 2007; ~ 2009   "memory not as good since" (07/14/2013)  . Tachy-brady syndrome ([place]) 07/17/2013  . Upper GI bleeding 07/01/2013    Review of Systems:  Review of Systems  Constitutional: Negative for chills and fever.  Cardiovascular: Negative for leg swelling.  Genitourinary: Positive for frequency. Negative for dysuria, flank pain and urgency.     Physical Exam:  Vitals:   08/05/17 1446  BP: (!) 170/92  Pulse: (!) 53  Temp: (!) 97.4 F (36.3 C)  TempSrc: Oral  SpO2: 100%  Weight: 177 lb 11.2 oz (80.6 kg)  Height: 5\' 8"  (1.727 m)   Physical Exam  Constitutional: He is well-developed, well-nourished, and in no distress.  Apathetic during interview  Cardiovascular: Normal rate, regular rhythm and normal heart sounds. Exam reveals no gallop and no friction rub.  No murmur  heard. Pulmonary/Chest: Effort normal and breath sounds normal. No respiratory distress. He has no wheezes. He has no rales.  Musculoskeletal: He exhibits no edema.  Skin: Skin is warm and dry.    Assessment & Plan:   See encounters tab for problem based medical decision making.    Patient discussed with Dr. Dareen Piano

## 2017-08-05 NOTE — Patient Instructions (Signed)
Patient instructed to take medications as defined in the Anti-coagulation Track section of this encounter.  Patient instructed to take today's dose.  Patient instructed to take  1 tablet by mouth once-daily at [facility] all days of week--EXCEPT on Mondays, Tuesdays, Wednesdays and Thursdays, take 1&1/2 tablets of your 5mg  peach-colored warfarin tablets on Mondays, Tuesdays, Wednesdays and Thursdays.  Patient verbalized understanding of these instructions.

## 2017-08-05 NOTE — Telephone Encounter (Signed)
Patient wife needs clarity on patient medicine. Pls call

## 2017-08-05 NOTE — Patient Instructions (Signed)
Mr. Silguero  It was a pleasure meeting you today. Please continue to take your blood pressure medications as prescribed. Please follow up with your primary care provider next month.

## 2017-08-05 NOTE — Progress Notes (Signed)
Anticoagulation Management Patrick [name] is a 72 y.o. male who reports to the clinic for monitoring of warfarin treatment.    Indication: atrial fibrillation, paroxysmal, chronic (Resolved)[148.91], Acute ischemic VBA thalamic stroke ([place]) [163.22], Chronic long term use of anticoagulants.  Duration: indefinite Supervising physician: Aldine Contes  Anticoagulation Clinic Visit History: Patient does not report signs/symptoms of bleeding or thromboembolism  Other recent changes: No diet, medications, lifestyle changes endorsed.  Anticoagulation Episode Summary    Current INR goal:   2.0-3.0  TTR:   61.9 % (3.9 y)  Next INR check:   08/26/2017  INR from last check:   2.10 (08/05/2017)  Weekly max warfarin dose:     Target end date:   Indefinite  INR check location:   [facility]  Preferred lab:     Send INR reminders to:      Indications   ATRIAL FIBRILLATION PAROXYSMAL CHRONIC (Resolved) [I48.91] Acute ischemic VBA thalamic stroke (HCC) [C37.628 I63.22] Chronic anticoagulation on coumadin [Z79.01]       Comments:           No Known Allergies Prior to Admission medications   Medication Sig Start Date End Date Taking? Authorizing Provider  acetaminophen (TYLENOL) 325 MG tablet Take 2 tablets (650 mg total) by mouth every 6 (six) hours as needed for moderate pain or headache. 12/08/15  Yes Love, [name], PA-C  Blood Glucose Monitoring Suppl (ONETOUCH VERIO) w/Device KIT 1 strip 4 (four) times daily by Does not apply route. Pharmacist, if not covered, provide [employer] Relion Meter, please counsel 06/10/17  Yes [name], MD  cloNIDine (CATAPRES) 0.3 MG tablet Take 1 tablet (0.3 mg total) 3 (three) times daily by mouth. 06/10/17  Yes [name], MD  diclofenac sodium (VOLTAREN) 1 % GEL Apply 4 g topically 4 (four) times daily. 11/22/15  Yes Liberty Handy, MD  diltiazem (CARTIA XT) 300 MG 24 hr capsule Take 1 capsule (300 mg total) daily by mouth. 06/10/17  Yes [name], MD  docusate sodium (COLACE) 100 MG capsule Take 100 mg by mouth 2 (two) times daily.   Yes [provider]  donepezil (ARICEPT) 5 MG tablet Take 1 tablet (5 mg total) by mouth at bedtime. 04/22/17  Yes [name], MD  furosemide (LASIX) 40 MG tablet Take 0.5 tablets (20 mg total) daily by mouth. 06/10/17  Yes [name], MD  glucose blood (ONE TOUCH ULTRA TEST) test strip Use four times daily before meals and at bedtime. Diagnosis code B15.1761 06/13/17  Yes [name], MD  hydrALAZINE (APRESOLINE) 50 MG tablet Take 1 tablet (50 mg total) 3 (three) times daily by mouth. 06/10/17  Yes [name], MD  insulin detemir (LEVEMIR) 100 UNIT/ML injection Inject 0.2 mLs (20 Units total) at bedtime into the skin. 06/10/17  Yes [name], MD  mirtazapine (REMERON) 15 MG tablet TAKE 1/2 (ONE-HALF) TABLET BY MOUTH AT BEDTIME 07/03/17  Yes Aldine Contes, MD  ONETOUCH DELICA LANCETS 60V MISC 1 strip 4 (four) times daily -  before meals and at bedtime by Does not apply route. 06/10/17  Yes [name], MD  warfarin (COUMADIN) 5 MG tablet Take 1 tablet (5 mg total) by mouth daily at 6 PM. Except take 1&1/2 tablet on mondays and thursdays 06/12/17  Yes [name], DO   Past Medical History:  Diagnosis Date  . Atrial fibrillation ([place])   . CAD (coronary artery disease)   . Chronic anticoagulation, on coumadin 07/17/2013  . CKD (chronic kidney disease),  stage II   . 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 Depression   . Diabetes mellitus type II    Insulin dependent  . GERD (gastroesophageal reflux disease)   . Gout   . Hyperlipidemia   . Hypertension   . Myocardial infarction [facility]) 2000's   "near heart attack" (07/14/2013)  . Paroxysmal atrial fibrillation (HCC)    on coumadin  . Permanent atrial fibrillation ([place]) 07/17/2013  . Shortness of breath    "can happen at any time" (07/14/2013)  . Sleep  apnea 07/2010   "has mask at home; seldom uses it" (07/14/2013)  . Stomach ulcer 1950's   "as a teenager"  . Stroke [facility]) ~ 2007; ~ 2009   "memory not as good since" (07/14/2013)  . Tachy-brady syndrome ([place]) 07/17/2013  . Upper GI bleeding 07/01/2013   Social History   Socioeconomic History  . Marital status: Married    Spouse name: Not on file  . Number of children: Not on file  . Years of education: 5  . Highest education level: Not on file  Social Needs  . Financial resource strain: Not on file  . Food insecurity - worry: Not on file  . Food insecurity - inability: Not on file  . Transportation needs - medical: Not on file  . Transportation needs - non-medical: Not on file  Occupational History  . 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 Smoking status: Former Smoker    Packs/day: 0.50    Years: 15.00    Pack years: 7.50    Types: Cigarettes    Last attempt to quit: 10/26/1976    Years since quitting: 40.8  . Smokeless tobacco: Former Systems developer  . Tobacco comment: 07/14/2013 "quit chewing and dipping in ~ 1970"  Substance and Sexual Activity  . Alcohol use: No    Alcohol/week: 0.0 oz    Comment: 07/14/2013 "drank a little; quit in ~ 1970; never had problem w/it"  . Drug use: No  . Sexual activity: Not Currently  Other Topics Concern  . Not on file  Social History Narrative   Admitted to [facility] 12/09/15   [name]   Former smoker - stopped 1978   Alcohol stopped 1970   Full Code   Family History  Problem Relation Age of Onset  . Diabetes Mother   . Hypertension Mother   . Heart Problems Father   . Cancer Brother   . Hypertension Sister   . Diabetes Sister     ASSESSMENT Recent Results: The most recent result is correlated with 40 mg per week: Lab Results  Component Value Date   INR 2.10 08/05/2017   INR 1.80 06/10/2017   INR 2.10 05/06/2017    Anticoagulation Dosing: Description   Take 1 tablet by mouth  once-daily at 6PM all days of week--EXCEPT on Mondays, Tuesdays, Wednesdays and Thursdays, take 1&1/2 tablets of your 71m peach-colored warfarin tablets on Mondays, Tuesdays, Wednesdays and Thursdays.      INR today: Therapeutic  PLAN Weekly dose was increased by 12% to 45 mg per week  Patient Instructions  Patient instructed to take medications as defined in the Anti-coagulation Track section of this encounter.  Patient instructed to take today's dose.  Patient instructed to take  1 tablet [facility]--EXCEPT on Mondays, Tuesdays, Wednesdays and Thursdays, take 1&1/2 tablets of your 569mpeach-colored warfarin tablets on  Mondays, Tuesdays, Wednesdays and Thursdays.  Patient verbalized understanding of these instructions.     Patient advised to contact clinic or seek medical attention if signs/symptoms of bleeding or thromboembolism occur.  Patient verbalized understanding by repeating back information and was advised to contact me if further medication-related questions arise. Patient was also provided an information handout.  Follow-up Return in 3 weeks (on 08/26/2017) for Follow up INR at 2:30PM.  Pennie Banter, PharmD, CACP, CPP  15 minutes spent face-to-face with the patient during the encounter. 50% of time spent on education. 50% of time was spent on fingerstick point of care INR sample collection, processing, results determination, dose adjustment and documentation.

## 2017-08-05 NOTE — Assessment & Plan Note (Signed)
Assessment: Uncontrolled hypertension Patient currently takes hydralazine 50 MG 3 times a day, diltiazem ER 300 mg daily, clonidine 0.3 mg 3 times a day and Lasix 20 mg daily.  Patient states he did not take his blood pressure medications yesterday or today.  He states that he has increased urinary frequency and is attributing it to the increase in hydralazine in November 2018.  He denies dysuria, fever/chills. When asked about BPH symptoms patient states that he does not have to stop in the middle of a urinary stream and is able to start the stream without issues and feel like he empties his bladder completely. However, he is unable to tell me how often he has to urinate in the middle of the night or the frequency of the timing of each urinary episode.  Per wife patient is up several times to urinate.  Patient likely has BPH but hard to tell based on interview.  Recommended patient follow up with PCP to further discuss urinary frequency.  At this time encouraged patient to continue to take blood pressure medications as prescribed as hydralazine should not be causing increase in urinary frequency.  Also encouraged patient to keep his appointment with his kidney doctor on 1/17.     Plan -advised patient to take current blood pressure regimen as prescribed -Follow-up with PCP next month

## 2017-08-06 NOTE — Progress Notes (Signed)
INTERNAL MEDICINE TEACHING ATTENDING ADDENDUM - Obinna Ehresman M.D  Duration- indefinite, Indication- pAfib, INR- therapeutic. Agree with pharmacy recommendations as outlined in their note.      

## 2017-08-06 NOTE — Telephone Encounter (Signed)
Patient seen in [facility] yesterday with this problem addressed

## 2017-08-15 ENCOUNTER — Other Ambulatory Visit: Payer: Self-pay | Admitting: Nephrology

## 2017-08-15 DIAGNOSIS — N184 Chronic kidney disease, stage 4 (severe): Secondary | ICD-10-CM | POA: Diagnosis not present

## 2017-08-15 DIAGNOSIS — I129 Hypertensive chronic kidney disease with stage 1 through stage 4 chronic kidney disease, or unspecified chronic kidney disease: Secondary | ICD-10-CM

## 2017-08-15 DIAGNOSIS — I5032 Chronic diastolic (congestive) heart failure: Secondary | ICD-10-CM | POA: Diagnosis not present

## 2017-08-15 DIAGNOSIS — R609 Edema, unspecified: Secondary | ICD-10-CM | POA: Diagnosis not present

## 2017-08-21 ENCOUNTER — Other Ambulatory Visit: Payer: Medicare Other

## 2017-08-23 ENCOUNTER — Ambulatory Visit
Admission: RE | Admit: 2017-08-23 | Discharge: 2017-08-23 | Disposition: A | Discharge status: Home / Self Care | Payer: Medicare Other | Source: Ambulatory Visit | Attending: Nephrology | Admitting: Nephrology

## 2017-08-23 DIAGNOSIS — N184 Chronic kidney disease, stage 4 (severe): Secondary | ICD-10-CM | POA: Diagnosis not present

## 2017-08-23 DIAGNOSIS — I129 Hypertensive chronic kidney disease with stage 1 through stage 4 chronic kidney disease, or unspecified chronic kidney disease: Secondary | ICD-10-CM

## 2017-08-26 ENCOUNTER — Ambulatory Visit (INDEPENDENT_AMBULATORY_CARE_PROVIDER_SITE_OTHER): Payer: Medicare Other | Admitting: Pharmacist

## 2017-08-26 DIAGNOSIS — I6322 Cerebral infarction due to unspecified occlusion or stenosis of basilar arteries: Secondary | ICD-10-CM

## 2017-08-26 DIAGNOSIS — I480 Paroxysmal atrial fibrillation: Secondary | ICD-10-CM | POA: Diagnosis present

## 2017-08-26 DIAGNOSIS — Z7901 Long term (current) use of anticoagulants: Secondary | ICD-10-CM | POA: Diagnosis not present

## 2017-08-26 DIAGNOSIS — I6381 Other cerebral infarction due to occlusion or stenosis of small artery: Secondary | ICD-10-CM

## 2017-08-26 DIAGNOSIS — I63219 Cerebral infarction due to unspecified occlusion or stenosis of unspecified vertebral arteries: Secondary | ICD-10-CM

## 2017-08-26 DIAGNOSIS — Z5181 Encounter for therapeutic drug level monitoring: Secondary | ICD-10-CM | POA: Diagnosis not present

## 2017-08-26 LAB — POCT INR: INR: 2.2

## 2017-08-26 NOTE — Progress Notes (Signed)
Anticoagulation Management Patrick [name] is a 72 y.o. male who reports to the clinic for monitoring of warfarin treatment.    Indication: atrial fibrillation, paroxysmal, chronic (resolved), Acute ischemic VBA thalamic stroke ([place]); chronic long term current use of anticoagulants.   Duration: indefinite Supervising physician: [place] [facility] Visit History: Patient does not report signs/symptoms of bleeding or thromboembolism  Other recent changes: No diet, medications, lifestyle changes endorsed.  Anticoagulation Episode Summary    Current INR goal:   2.0-3.0  TTR:   62.4 % (4 y)  Next INR check:   09/23/2017  INR from last check:   2.20 (08/26/2017)  Weekly max warfarin dose:     Target end date:   Indefinite  INR check location:   [facility]  Preferred lab:     Send INR reminders to:      Indications   ATRIAL FIBRILLATION PAROXYSMAL CHRONIC (Resolved) [I48.91] Acute ischemic VBA thalamic stroke (HCC) [U93.235 I63.22] Chronic anticoagulation on coumadin [Z79.01]       Comments:           No Known Allergies Prior to Admission medications   Medication Sig Start Date End Date Taking? Authorizing Provider  acetaminophen (TYLENOL) 325 MG tablet Take 2 tablets (650 mg total) by mouth every 6 (six) hours as needed for moderate pain or headache. 12/08/15  Yes Love, [name], PA-C  Blood Glucose Monitoring Suppl (ONETOUCH VERIO) w/Device KIT 1 strip 4 (four) times daily by Does not apply route. Pharmacist, if not covered, provide [employer] Meter, please counsel 06/10/17  Yes [name], MD  cloNIDine (CATAPRES) 0.3 MG tablet Take 1 tablet (0.3 mg total) 3 (three) times daily by mouth. 06/10/17  Yes [name], MD  diclofenac sodium (VOLTAREN) 1 % GEL Apply 4 g topically 4 (four) times daily. 11/22/15  Yes Liberty Handy, MD  diltiazem (CARTIA XT) 300 MG 24 hr capsule Take 1 capsule (300 mg total) daily by mouth. 06/10/17  Yes [name], MD   docusate sodium (COLACE) 100 MG capsule Take 100 mg by mouth 2 (two) times daily.   Yes [provider]  donepezil (ARICEPT) 5 MG tablet Take 1 tablet (5 mg total) by mouth at bedtime. 04/22/17  Yes [name], MD  furosemide (LASIX) 40 MG tablet Take 0.5 tablets (20 mg total) daily by mouth. 06/10/17  Yes [name], MD  glucose blood (ONE TOUCH ULTRA TEST) test strip Use four times daily before meals and at bedtime. Diagnosis code T73.2202 06/13/17  Yes [name], MD  hydrALAZINE (APRESOLINE) 50 MG tablet Take 1 tablet (50 mg total) 3 (three) times daily by mouth. 06/10/17  Yes [name], MD  insulin detemir (LEVEMIR) 100 UNIT/ML injection Inject 0.2 mLs (20 Units total) at bedtime into the skin. 06/10/17  Yes [name], MD  mirtazapine (REMERON) 15 MG tablet TAKE 1/2 (ONE-HALF) TABLET BY MOUTH AT BEDTIME 08/05/17  Yes [name], MD  [facility] DELICA LANCETS 54Y MISC 1 strip 4 (four) times daily -  before meals and at bedtime by Does not apply route. 06/10/17  Yes [name], MD  warfarin (COUMADIN) 5 MG tablet Take 1 tablet (5 mg total) by mouth daily at 6 PM. Except take 1&1/2 tablet on mondays and thursdays 06/12/17  Yes [name], DO   Past Medical History:  Diagnosis Date  . Atrial fibrillation ([place])   . CAD (coronary artery disease)   . Chronic anticoagulation, on coumadin 07/17/2013  . CKD (chronic kidney  disease), stage II   . 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 Depression   . Diabetes mellitus type II    Insulin dependent  . GERD (gastroesophageal reflux disease)   . Gout   . Hyperlipidemia   . Hypertension   . Myocardial infarction [facility]) 2000's   "near heart attack" (07/14/2013)  . Paroxysmal atrial fibrillation (HCC)    on coumadin  . Permanent atrial fibrillation ([place]) 07/17/2013  . Shortness of breath    "can happen at any time" (07/14/2013)  . Sleep apnea 07/2010    "has mask at home; seldom uses it" (07/14/2013)  . Stomach ulcer 1950's   "as a teenager"  . Stroke [facility]) ~ 2007; ~ 2009   "memory not as good since" (07/14/2013)  . Tachy-brady syndrome ([place]) 07/17/2013  . Upper GI bleeding 07/01/2013   Social History   Socioeconomic History  . Marital status: Married    Spouse name: Not on file  . Number of children: Not on file  . Years of education: 5  . Highest education level: Not on file  Social Needs  . Financial resource strain: Not on file  . Food insecurity - worry: Not on file  . Food insecurity - inability: Not on file  . Transportation needs - medical: Not on file  . Transportation needs - non-medical: Not on file  Occupational History  . 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 Smoking status: Former Smoker    Packs/day: 0.50    Years: 15.00    Pack years: 7.50    Types: Cigarettes    Last attempt to quit: 10/26/1976    Years since quitting: 40.8  . Smokeless tobacco: Former Systems developer  . Tobacco comment: 07/14/2013 "quit chewing and dipping in ~ 1970"  Substance and Sexual Activity  . Alcohol use: No    Alcohol/week: 0.0 oz    Comment: 07/14/2013 "drank a little; quit in ~ 1970; never had problem w/it"  . Drug use: No  . Sexual activity: Not Currently  Other Topics Concern  . Not on file  Social History Narrative   Admitted to [facility] 12/09/15   [name]   Former smoker - stopped 1978   Alcohol stopped 1970   Full Code   Family History  Problem Relation Age of Onset  . Diabetes Mother   . Hypertension Mother   . Heart Problems Father   . Cancer Brother   . Hypertension Sister   . Diabetes Sister     ASSESSMENT Recent Results: The most recent result is correlated with 45 mg per week: Lab Results  Component Value Date   INR 2.20 08/26/2017   INR 2.10 08/05/2017   INR 1.80 06/10/2017    Anticoagulation Dosing: Description   Take 1 tablet by mouth once-daily at 6PM all  days of week--EXCEPT on Mondays, Tuesdays, Wednesdays and Thursdays, take 1&1/2 tablets of your 27m peach-colored warfarin tablets on Mondays, Tuesdays, Wednesdays and Thursdays.      INR today: Therapeutic  PLAN Weekly dose was unchanged.  Patient Instructions  Patient instructed to take medications as defined in the Anti-coagulation Track section of this encounter.  Patient instructed to take today's dose.  Patient instructed to take [facility]--EXCEPT on Mondays, Tuesdays, Wednesdays and Thursdays, take 1&1/2 tablets of your 565mpeach-colored warfarin tablets on Mondays, Tuesdays, Wednesdays and Thursdays.  Patient  verbalized understanding of these instructions.     Patient advised to contact clinic or seek medical attention if signs/symptoms of bleeding or thromboembolism occur.  Patient verbalized understanding by repeating back information and was advised to contact me if further medication-related questions arise. Patient was also provided an information handout.  Follow-up Return in 4 weeks (on 09/23/2017) for Follow up  INR at 2:45PM.  Pennie Banter , PharmD, CACP, CPP 15 minutes spent face-to-face with the patient during the encounter. 50% of time spent on education. 50% of time was spent on fingerstick point of care INR sample collection, processing, results determination, and documentation in [email or web address].

## 2017-08-26 NOTE — Patient Instructions (Signed)
Patient instructed to take medications as defined in the Anti-coagulation Track section of this encounter.  Patient instructed to take today's dose.  Patient instructed to take 1 tablet by mouth once-daily at [facility] all days of week--EXCEPT on Mondays, Tuesdays, Wednesdays and Thursdays, take 1&1/2 tablets of your 5mg  peach-colored warfarin tablets on Mondays, Tuesdays, Wednesdays and Thursdays.  Patient verbalized understanding of these instructions.

## 2017-08-27 NOTE — Progress Notes (Signed)
I have reviewed Dr. Gladstone Pih note.  Patient is on [facility] for Afib and other reasons, INR at goal and dose unchanged.

## 2017-09-09 ENCOUNTER — Other Ambulatory Visit: Payer: Self-pay | Admitting: *Deleted

## 2017-09-09 DIAGNOSIS — F0150 Vascular dementia without behavioral disturbance: Secondary | ICD-10-CM

## 2017-09-09 NOTE — Telephone Encounter (Signed)
Spoke with pt's wife-previous rx was sent to [facility]), but rx needs to go to [facility] ([address]will send request to pcp.Despina Hidden Cassady2/11/20191:21 PM

## 2017-09-10 ENCOUNTER — Other Ambulatory Visit: Payer: Self-pay | Admitting: Internal Medicine

## 2017-09-10 DIAGNOSIS — F0150 Vascular dementia without behavioral disturbance: Secondary | ICD-10-CM

## 2017-09-10 MED ORDER — DONEPEZIL HCL 5 MG PO TABS: 5.0000 mg | ORAL_TABLET | Freq: Every day | ORAL | 3 refills | Status: DC

## 2017-09-10 MED ORDER — MIRTAZAPINE 15 MG PO TABS: ORAL_TABLET | ORAL | 0 refills | Status: DC

## 2017-09-19 ENCOUNTER — Encounter: Payer: Medicare Other | Admitting: Internal Medicine

## 2017-09-19 ENCOUNTER — Encounter: Payer: Self-pay | Admitting: Internal Medicine

## 2017-09-23 ENCOUNTER — Ambulatory Visit (INDEPENDENT_AMBULATORY_CARE_PROVIDER_SITE_OTHER): Payer: Medicare Other | Admitting: Pharmacist

## 2017-09-23 DIAGNOSIS — Z7901 Long term (current) use of anticoagulants: Secondary | ICD-10-CM

## 2017-09-23 DIAGNOSIS — Z8673 Personal history of transient ischemic attack (TIA), and cerebral infarction without residual deficits: Secondary | ICD-10-CM

## 2017-09-23 DIAGNOSIS — I480 Paroxysmal atrial fibrillation: Secondary | ICD-10-CM

## 2017-09-23 DIAGNOSIS — I6381 Other cerebral infarction due to occlusion or stenosis of small artery: Secondary | ICD-10-CM

## 2017-09-23 DIAGNOSIS — I63219 Cerebral infarction due to unspecified occlusion or stenosis of unspecified vertebral arteries: Secondary | ICD-10-CM

## 2017-09-23 DIAGNOSIS — Z5181 Encounter for therapeutic drug level monitoring: Secondary | ICD-10-CM

## 2017-09-23 DIAGNOSIS — I6322 Cerebral infarction due to unspecified occlusion or stenosis of basilar arteries: Secondary | ICD-10-CM

## 2017-09-23 LAB — POCT INR: INR: 1

## 2017-09-23 NOTE — Progress Notes (Signed)
Take  Anticoagulation Management Patrick [name] is a 72 y.o. male who reports to the clinic for monitoring of warfarin treatment.    Indication: atrial fibrillation; history of acute ischemic VBA thalamic stroke; current long term use of anticoagulant.   Duration: indefinite Supervising physician: Aldine Contes  Anticoagulation Clinic Visit History: Patient does not report signs/symptoms of bleeding or thromboembolism  Other recent changes: No diet, medications, lifestyle changes endorsed other than as documented in patient findings (MISSED DOSES  Anticoagulation Episode Summary    Current INR goal:   2.0-3.0  TTR:   61.6 % (4.1 y)  Next INR check:   10/14/2017  INR from last check:   1.0! (09/23/2017)  Weekly max warfarin dose:     Target end date:   Indefinite  INR check location:   Coumadin Clinic  Preferred lab:     Send INR reminders to:      Indications   ATRIAL FIBRILLATION PAROXYSMAL CHRONIC (Resolved) [I48.91] Acute ischemic VBA thalamic stroke (HCC) [Q22.297 I63.22] Chronic anticoagulation on coumadin [Z79.01]       Comments:           No Known Allergies Prior to Admission medications   Medication Sig Start Date End Date Taking? Authorizing Provider  acetaminophen (TYLENOL) 325 MG tablet Take 2 tablets (650 mg total) by mouth every 6 (six) hours as needed for moderate pain or headache. 12/08/15  Yes Love, [name], PA-C  Blood Glucose Monitoring Suppl (ONETOUCH VERIO) w/Device KIT 1 strip 4 (four) times daily by Does not apply route. Pharmacist, if not covered, provide [employer] Relion Meter, please counsel 06/10/17  Yes [name], MD  cloNIDine (CATAPRES) 0.3 MG tablet Take 1 tablet (0.3 mg total) 3 (three) times daily by mouth. 06/10/17  Yes [name], MD  diclofenac sodium (VOLTAREN) 1 % GEL Apply 4 g topically 4 (four) times daily. 11/22/15  Yes Liberty Handy, MD  diltiazem (CARTIA XT) 300 MG 24 hr capsule Take 1 capsule (300 mg total) daily by mouth.  06/10/17  Yes [name], MD  docusate sodium (COLACE) 100 MG capsule Take 100 mg by mouth 2 (two) times daily.   Yes [provider]  donepezil (ARICEPT) 5 MG tablet Take 1 tablet (5 mg total) by mouth at bedtime. 09/10/17  Yes [name], MD  furosemide (LASIX) 40 MG tablet Take 0.5 tablets (20 mg total) daily by mouth. 06/10/17  Yes [name], MD  glucose blood (ONE TOUCH ULTRA TEST) test strip Use four times daily before meals and at bedtime. Diagnosis code L89.2119 06/13/17  Yes [name], MD  hydrALAZINE (APRESOLINE) 50 MG tablet Take 1 tablet (50 mg total) 3 (three) times daily by mouth. 06/10/17  Yes [name], MD  insulin detemir (LEVEMIR) 100 UNIT/ML injection Inject 0.2 mLs (20 Units total) at bedtime into the skin. 06/10/17  Yes [name], MD  mirtazapine (REMERON) 15 MG tablet TAKE 1/2 (ONE-HALF) TABLET BY MOUTH AT BEDTIME 09/10/17  Yes [name], MD  [facility] DELICA LANCETS 41D MISC 1 strip 4 (four) times daily -  before meals and at bedtime by Does not apply route. 06/10/17  Yes [name], MD  warfarin (COUMADIN) 5 MG tablet Take 1 tablet (5 mg total) by mouth daily at 6 PM. Except take 1&1/2 tablet on mondays and thursdays 06/12/17  Yes [name], DO   Past Medical History:  Diagnosis Date  . Atrial fibrillation ([place])   . CAD (coronary artery disease)   . Chronic anticoagulation,  on coumadin 07/17/2013  . CKD (chronic kidney disease), stage II   . 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 Depression   . Diabetes mellitus type II    Insulin dependent  . GERD (gastroesophageal reflux disease)   . Gout   . Hyperlipidemia   . Hypertension   . Myocardial infarction [facility]) 2000's   "near heart attack" (07/14/2013)  . Paroxysmal atrial fibrillation (HCC)    on coumadin  . Permanent atrial fibrillation ([place]) 07/17/2013  . Shortness of breath    "can happen at any time"  (07/14/2013)  . Sleep apnea 07/2010   "has mask at home; seldom uses it" (07/14/2013)  . Stomach ulcer 1950's   "as a teenager"  . Stroke [facility]) ~ 2007; ~ 2009   "memory not as good since" (07/14/2013)  . Tachy-brady syndrome ([place]) 07/17/2013  . Upper GI bleeding 07/01/2013   Social History   Socioeconomic History  . Marital status: Married    Spouse name: Not on file  . Number of children: Not on file  . Years of education: 5  . Highest education level: Not on file  Social Needs  . Financial resource strain: Not on file  . Food insecurity - worry: Not on file  . Food insecurity - inability: Not on file  . Transportation needs - medical: Not on file  . Transportation needs - non-medical: Not on file  Occupational History  . 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 Smoking status: Former Smoker    Packs/day: 0.50    Years: 15.00    Pack years: 7.50    Types: Cigarettes    Last attempt to quit: 10/26/1976    Years since quitting: 40.9  . Smokeless tobacco: Former Systems developer  . Tobacco comment: 07/14/2013 "quit chewing and dipping in ~ 1970"  Substance and Sexual Activity  . Alcohol use: No    Alcohol/week: 0.0 oz    Comment: 07/14/2013 "drank a little; quit in ~ 1970; never had problem w/it"  . Drug use: No  . Sexual activity: Not Currently  Other Topics Concern  . Not on file  Social History Narrative   Admitted to [facility] 12/09/15   [name]   Former smoker - stopped 1978   Alcohol stopped 1970   Full Code   Family History  Problem Relation Age of Onset  . Diabetes Mother   . Hypertension Mother   . Heart Problems Father   . Cancer Brother   . Hypertension Sister   . Diabetes Sister     ASSESSMENT Recent Results: The most recent result is correlated with 35 mg per week--was supposed to have taken 68m per week: Lab Results  Component Value Date   INR 1.0 09/23/2017   INR 2.20 08/26/2017   INR 2.10 08/05/2017     Anticoagulation Dosing: Description   Take 1 tablet by mouth once-daily at 6PM all days of week--EXCEPT on Mondays, Tuesdays, Wednesdays and Thursdays, take 1&1/2 tablets of your 559mpeach-colored warfarin tablets on Mondays, Tuesdays, Wednesdays and Thursdays.      INR today: Subtherapeutic  PLAN Weekly dose was unchanged--patient instructed to continue his current regimen and not to miss doses.   Patient Instructions  Patient instructed to take medications as defined in the Anti-coagulation Track section of this encounter.  Patient instructed to take today's dose.  Patient instructed to take 1 tablet by mouth [facility]  all days of week--EXCEPT on Mondays, Tuesdays, Wednesdays and Thursdays, take 1&1/2 tablets of your 76m peach-colored warfarin tablets on Mondays, Tuesdays, Wednesdays and Thursdays. Patient verbalized understanding of these instructions.     Patient advised to contact clinic or seek medical attention if signs/symptoms of bleeding or thromboembolism occur.  Patient verbalized understanding by repeating back information and was advised to contact me if further medication-related questions arise. Patient was also provided an information handout.  Follow-up Return in 3 weeks (on 10/14/2017) for Follow up INR at 3:45PM.  JPennie Banter PharmD, CACP, CPP  15 minutes spent face-to-face with the patient during the encounter. 50% of time spent on education. 50% of time was spent on fingerstick point of care INR sample collection, processing, results determination, instructions regarding compliance and documentation in [email or web address]

## 2017-09-23 NOTE — Patient Instructions (Signed)
Patient instructed to take medications as defined in the Anti-coagulation Track section of this encounter.  Patient instructed to take today's dose.  Patient instructed to take 1 tablet by mouth once-daily at [facility] all days of week--EXCEPT on Mondays, Tuesdays, Wednesdays and Thursdays, take 1&1/2 tablets of your 5mg  peach-colored warfarin tablets on Mondays, Tuesdays, Wednesdays and Thursdays. Patient verbalized understanding of these instructions.

## 2017-09-24 ENCOUNTER — Other Ambulatory Visit: Payer: Self-pay | Admitting: Internal Medicine

## 2017-09-24 DIAGNOSIS — R351 Nocturia: Secondary | ICD-10-CM | POA: Diagnosis not present

## 2017-09-24 DIAGNOSIS — D49512 Neoplasm of unspecified behavior of left kidney: Secondary | ICD-10-CM | POA: Diagnosis not present

## 2017-09-24 DIAGNOSIS — N401 Enlarged prostate with lower urinary tract symptoms: Secondary | ICD-10-CM | POA: Diagnosis not present

## 2017-09-24 DIAGNOSIS — R350 Frequency of micturition: Secondary | ICD-10-CM | POA: Diagnosis not present

## 2017-09-25 ENCOUNTER — Encounter: Payer: Self-pay | Admitting: Internal Medicine

## 2017-09-25 NOTE — Progress Notes (Signed)
INTERNAL MEDICINE TEACHING ATTENDING ADDENDUM - Aldine Contes M.D  Duration- indefinite, Indication- afib, CVA, INR- sub therapeutic. Agree with pharmacy recommendations as outlined in their note.

## 2017-10-06 ENCOUNTER — Other Ambulatory Visit: Payer: Self-pay | Admitting: Internal Medicine

## 2017-10-06 DIAGNOSIS — I10 Essential (primary) hypertension: Secondary | ICD-10-CM

## 2017-10-07 NOTE — Telephone Encounter (Signed)
Next appt scheduled  4/25 with PCP.

## 2017-10-09 ENCOUNTER — Telehealth: Payer: Self-pay

## 2017-10-10 ENCOUNTER — Telehealth: Payer: Self-pay | Admitting: Internal Medicine

## 2017-10-10 NOTE — Telephone Encounter (Signed)
[name] is returning a phone from yesterday

## 2017-10-14 ENCOUNTER — Ambulatory Visit: Payer: Medicare Other

## 2017-10-14 NOTE — Telephone Encounter (Signed)
Patient may have received a call to confirm appt for today. Unfortunately, patient cancelled this appt. Hubbard Hartshorn, RN, BSN

## 2017-10-17 ENCOUNTER — Other Ambulatory Visit: Payer: Self-pay

## 2017-10-17 NOTE — Telephone Encounter (Signed)
Patrick [name] with [facility] requesting a refill on hydrALAZINE (APRESOLINE) 50 MG tablet. Please call back @ [phone], reference # [id number].

## 2017-10-18 MED ORDER — HYDRALAZINE HCL 50 MG PO TABS: 50.0000 mg | ORAL_TABLET | Freq: Three times a day (TID) | ORAL | 0 refills | Status: DC

## 2017-10-24 NOTE — Telephone Encounter (Signed)
Contacted patient to help with medications, patient requested help with insulin. Advised patient to see me in clinic for further assistance.

## 2017-11-21 ENCOUNTER — Ambulatory Visit: Payer: Medicare Other | Admitting: Pharmacist

## 2017-11-21 ENCOUNTER — Encounter: Payer: Medicare Other | Admitting: Internal Medicine

## 2017-11-21 DIAGNOSIS — I129 Hypertensive chronic kidney disease with stage 1 through stage 4 chronic kidney disease, or unspecified chronic kidney disease: Secondary | ICD-10-CM | POA: Diagnosis not present

## 2017-11-21 DIAGNOSIS — N184 Chronic kidney disease, stage 4 (severe): Secondary | ICD-10-CM | POA: Diagnosis not present

## 2017-11-21 DIAGNOSIS — R609 Edema, unspecified: Secondary | ICD-10-CM | POA: Diagnosis not present

## 2017-11-22 ENCOUNTER — Ambulatory Visit: Payer: Medicare Other | Admitting: Pharmacist

## 2017-11-28 ENCOUNTER — Telehealth: Payer: Self-pay | Admitting: Internal Medicine

## 2017-12-02 ENCOUNTER — Other Ambulatory Visit: Payer: Self-pay

## 2017-12-02 MED ORDER — INSULIN DETEMIR 100 UNIT/ML ~~LOC~~ SOLN: 20.0000 [IU] | Freq: Every day | SUBCUTANEOUS | 0 refills | Status: DC

## 2017-12-02 MED ORDER — FUROSEMIDE 40 MG PO TABS: 20.0000 mg | ORAL_TABLET | Freq: Every day | ORAL | 0 refills | Status: DC

## 2017-12-09 ENCOUNTER — Other Ambulatory Visit: Payer: Self-pay

## 2017-12-09 ENCOUNTER — Other Ambulatory Visit: Payer: Self-pay | Admitting: Internal Medicine

## 2017-12-09 NOTE — Telephone Encounter (Signed)
donepezil (ARICEPT) 5 MG tablet  mirtazapine (REMERON) 15 MG tablet  Refill request @ [facility] [address].

## 2017-12-09 NOTE — Telephone Encounter (Signed)
Donepezil #90 with 3 refills sent 09/10/2017. Confirmed with [facility] that they have this Rx on file. They will get this ready for patient today. Will route request for remeron to PCP. Hubbard Hartshorn, RN, BSN

## 2017-12-10 MED ORDER — MIRTAZAPINE 15 MG PO TABS: ORAL_TABLET | ORAL | 0 refills | Status: DC

## 2017-12-19 ENCOUNTER — Encounter: Payer: Medicare Other | Admitting: Internal Medicine

## 2018-01-06 ENCOUNTER — Other Ambulatory Visit: Payer: Self-pay

## 2018-01-06 DIAGNOSIS — I10 Essential (primary) hypertension: Secondary | ICD-10-CM

## 2018-01-06 NOTE — Telephone Encounter (Signed)
cloNIDine (CATAPRES) 0.3 MG tablet  diltiazem (CARDIZEM CD) 300 MG 24 hr capsule, Refill request @ [facility] [address]. Requesting med by today, per patient he is completley out.

## 2018-01-08 ENCOUNTER — Other Ambulatory Visit: Payer: Self-pay | Admitting: Internal Medicine

## 2018-01-08 DIAGNOSIS — I10 Essential (primary) hypertension: Secondary | ICD-10-CM

## 2018-01-08 DIAGNOSIS — G4730 Sleep apnea, unspecified: Secondary | ICD-10-CM

## 2018-01-08 MED ORDER — CLONIDINE HCL 0.3 MG PO TABS: 0.3000 mg | ORAL_TABLET | Freq: Three times a day (TID) | ORAL | 0 refills | Status: DC

## 2018-01-08 MED ORDER — DILTIAZEM HCL ER COATED BEADS 300 MG PO CP24: 300.0000 mg | ORAL_CAPSULE | Freq: Every day | ORAL | 0 refills | Status: DC

## 2018-01-08 NOTE — Telephone Encounter (Signed)
Dr. Ronalee Red is here and should be able to fill this. Will defer to her

## 2018-01-09 MED ORDER — CLONIDINE HCL 0.3 MG PO TABS: 0.3000 mg | ORAL_TABLET | Freq: Three times a day (TID) | ORAL | 0 refills | Status: DC

## 2018-01-09 MED ORDER — DILTIAZEM HCL ER COATED BEADS 300 MG PO CP24: 300.0000 mg | ORAL_CAPSULE | Freq: Every day | ORAL | 0 refills | Status: DC

## 2018-01-13 MED ORDER — MIRTAZAPINE 15 MG PO TABS: ORAL_TABLET | ORAL | 0 refills | Status: DC

## 2018-01-13 MED ORDER — CLONIDINE HCL 0.3 MG PO TABS: 0.3000 mg | ORAL_TABLET | Freq: Three times a day (TID) | ORAL | 0 refills | Status: DC

## 2018-01-13 MED ORDER — DILTIAZEM HCL ER COATED BEADS 300 MG PO CP24: 300.0000 mg | ORAL_CAPSULE | Freq: Every day | ORAL | 0 refills | Status: DC

## 2018-01-13 NOTE — Addendum Note (Signed)
Addended by: Forde Dandy on: 01/13/2018 02:48 PM   Modules accepted: Orders

## 2018-01-13 NOTE — Progress Notes (Signed)
Transferred prescriptions to [place]

## 2018-01-19 ENCOUNTER — Encounter: Payer: Self-pay | Admitting: *Deleted

## 2018-04-02 DIAGNOSIS — I129 Hypertensive chronic kidney disease with stage 1 through stage 4 chronic kidney disease, or unspecified chronic kidney disease: Secondary | ICD-10-CM | POA: Diagnosis not present

## 2018-04-02 DIAGNOSIS — N184 Chronic kidney disease, stage 4 (severe): Secondary | ICD-10-CM | POA: Diagnosis not present

## 2018-04-02 DIAGNOSIS — R609 Edema, unspecified: Secondary | ICD-10-CM | POA: Diagnosis not present

## 2018-04-16 ENCOUNTER — Other Ambulatory Visit: Payer: Self-pay | Admitting: Internal Medicine

## 2018-04-16 DIAGNOSIS — I10 Essential (primary) hypertension: Secondary | ICD-10-CM

## 2018-04-18 ENCOUNTER — Other Ambulatory Visit: Payer: Self-pay | Admitting: Internal Medicine

## 2018-04-18 DIAGNOSIS — I10 Essential (primary) hypertension: Secondary | ICD-10-CM

## 2018-05-22 ENCOUNTER — Other Ambulatory Visit: Payer: Self-pay

## 2018-05-22 DIAGNOSIS — I10 Essential (primary) hypertension: Secondary | ICD-10-CM

## 2018-05-22 NOTE — Telephone Encounter (Signed)
CARTIA XT 300 MG 24 hr capsule, refill request @  [place], [place] - [address] [phone] (Phone) [phone] (Fax)

## 2018-05-23 MED ORDER — DILTIAZEM HCL ER COATED BEADS 300 MG PO CP24: 300.0000 mg | ORAL_CAPSULE | Freq: Every day | ORAL | 0 refills | Status: DC

## 2018-06-04 ENCOUNTER — Encounter: Payer: Self-pay | Admitting: Internal Medicine

## 2018-06-04 ENCOUNTER — Ambulatory Visit (INDEPENDENT_AMBULATORY_CARE_PROVIDER_SITE_OTHER): Payer: Medicare Other | Admitting: Internal Medicine

## 2018-06-04 ENCOUNTER — Other Ambulatory Visit: Payer: Self-pay

## 2018-06-04 VITALS — BP 162/76 | HR 71 | Temp 97.9°F | Ht 68.0 in | Wt 188.4 lb

## 2018-06-04 DIAGNOSIS — Z8673 Personal history of transient ischemic attack (TIA), and cerebral infarction without residual deficits: Secondary | ICD-10-CM | POA: Diagnosis not present

## 2018-06-04 DIAGNOSIS — Z7901 Long term (current) use of anticoagulants: Secondary | ICD-10-CM | POA: Diagnosis not present

## 2018-06-04 DIAGNOSIS — Z87891 Personal history of nicotine dependence: Secondary | ICD-10-CM | POA: Diagnosis not present

## 2018-06-04 DIAGNOSIS — IMO0002 Reserved for concepts with insufficient information to code with codable children: Secondary | ICD-10-CM

## 2018-06-04 DIAGNOSIS — R011 Cardiac murmur, unspecified: Secondary | ICD-10-CM | POA: Diagnosis not present

## 2018-06-04 DIAGNOSIS — I130 Hypertensive heart and chronic kidney disease with heart failure and stage 1 through stage 4 chronic kidney disease, or unspecified chronic kidney disease: Secondary | ICD-10-CM

## 2018-06-04 DIAGNOSIS — N184 Chronic kidney disease, stage 4 (severe): Secondary | ICD-10-CM

## 2018-06-04 DIAGNOSIS — I10 Essential (primary) hypertension: Secondary | ICD-10-CM

## 2018-06-04 DIAGNOSIS — E118 Type 2 diabetes mellitus with unspecified complications: Secondary | ICD-10-CM

## 2018-06-04 DIAGNOSIS — E1165 Type 2 diabetes mellitus with hyperglycemia: Secondary | ICD-10-CM

## 2018-06-04 DIAGNOSIS — R0601 Orthopnea: Secondary | ICD-10-CM

## 2018-06-04 DIAGNOSIS — E1122 Type 2 diabetes mellitus with diabetic chronic kidney disease: Secondary | ICD-10-CM | POA: Diagnosis not present

## 2018-06-04 DIAGNOSIS — I4821 Permanent atrial fibrillation: Secondary | ICD-10-CM | POA: Diagnosis not present

## 2018-06-04 DIAGNOSIS — Z79899 Other long term (current) drug therapy: Secondary | ICD-10-CM

## 2018-06-04 DIAGNOSIS — E113419 Type 2 diabetes mellitus with severe nonproliferative diabetic retinopathy with macular edema, unspecified eye: Secondary | ICD-10-CM

## 2018-06-04 DIAGNOSIS — I5032 Chronic diastolic (congestive) heart failure: Secondary | ICD-10-CM | POA: Diagnosis not present

## 2018-06-04 DIAGNOSIS — I63413 Cerebral infarction due to embolism of bilateral middle cerebral arteries: Secondary | ICD-10-CM

## 2018-06-04 DIAGNOSIS — Z9114 Patient's other noncompliance with medication regimen: Secondary | ICD-10-CM

## 2018-06-04 LAB — POCT GLYCOSYLATED HEMOGLOBIN (HGB A1C): HEMOGLOBIN A1C: 7.4 % — AB (ref 4.0–5.6)

## 2018-06-04 LAB — GLUCOSE, CAPILLARY: Glucose-Capillary: 153 mg/dL — ABNORMAL HIGH (ref 70–99)

## 2018-06-04 MED ORDER — WARFARIN SODIUM 5 MG PO TABS: ORAL_TABLET | ORAL | 2 refills | Status: DC

## 2018-06-04 NOTE — Assessment & Plan Note (Signed)
Has a stopped warfarin few months ago and has not informed his physician. -Resumed warfarin 5 mg daily -Follow-up in clinic in 2 weeks to check INR and adjust warfarin dose

## 2018-06-04 NOTE — Assessment & Plan Note (Addendum)
Has irregular rhythm on exam.  Rate is normal. Patient was supposed to be on warfarin.  He has a stop that by his decision several months ago.  Importance of taking warfarin explained to patient and his wife. -We will restart warfarin at dose of 5 mg daily.  May adjust the dose and next visit -Follow-up in clinic in 2 weeks for INR

## 2018-06-04 NOTE — Patient Instructions (Addendum)
Thank you for allowing [name] to provide your care today.  We talked about about your leg swelling. Today you found to have extra fluid at your legs because not taking your medications. I hear you about taking multiple medications is frustrating, but I recommend you to restart your Lasix 20 mg daily and Qoumadin 5 mg daily and come back to clinic in 2 weeks to be evaluated and do some blood test. Your Hb A1c was 7.4 today  Please follow-up in 2 weeks.   Should you have any questions or concerns please call the internal medicine clinic at [phone].   Thanks Dr. Myrtie Hawk

## 2018-06-04 NOTE — Assessment & Plan Note (Signed)
Resumed warfarin at 5 mg daily today.

## 2018-06-04 NOTE — Progress Notes (Signed)
   CC: lower extremity swelling  HPI:  [name] is a 72 y.o. gentleman with acute mental past medical history as below, came into the clinic today for follow-up and leg swelling for 5 days.  Patient has not been compliant to his medications. Just taking Amlodipin 10 mg QD and Cartia 300/24 h QD. please see problem base charting for further details and assessment and plan.  Past Medical History:  Diagnosis Date  . Atrial fibrillation ([place])   . CAD (coronary artery disease)   . Chronic anticoagulation, on coumadin 07/17/2013  . CKD (chronic kidney disease), stage II   . 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 Depression   . Diabetes mellitus type II    Insulin dependent  . GERD (gastroesophageal reflux disease)   . Gout   . Hyperlipidemia   . Hypertension   . Myocardial infarction [facility]) 2000's   "near heart attack" (07/14/2013)  . Paroxysmal atrial fibrillation (HCC)    on coumadin  . Permanent atrial fibrillation ([place]) 07/17/2013  . Shortness of breath    "can happen at any time" (07/14/2013)  . Sleep apnea 07/2010   "has mask at home; seldom uses it" (07/14/2013)  . Stomach ulcer 1950's   "as a teenager"  . Stroke [facility]) ~ 2007; ~ 2009   "memory not as good since" (07/14/2013)  . Tachy-brady syndrome ([place]) 07/17/2013  . Upper GI bleeding 07/01/2013   Family history: Father with heart disease Brother had Cancer Sisters have DM  Social history: Denies Smoking Denies Alcohol use Denies Illicit drug use  Review of Systems: Reports Orthopnea. No cough. No chest pain. Reports some frequency. No abdominal pain. No diarrhea. No Constipation.    Physical Exam:  Vitals:   06/04/18 1411  BP: (!) 162/76  Pulse: 71  Temp: 97.9 F (36.6 C)  TempSrc: Oral  SpO2: 99%  Weight: 188 lb 6.4 oz (85.5 kg)  Height: 5\' 8"  (1.727 m)   General: HEENT: Normocephalic atraumatic CV: Irregular rhythm, normal rate, systolic  murmur.  No JVD Chest and lungs: Has normal work of breathing CTA bilaterally, no wheeze, has bibasilar fine crackle Abdomen: BS are present, abdomen is soft, has no tenderness to palpation Extremities: Bilateral non pitting edema on feet. Pitting edema up to mid tibia. Bibasilar fne crackle.  Pulses are difficult to find due to severe edema at lower extremities.  Has normal pulses lower extremities Neurology exam: Patient is alert and oriented x3 Psychiatric exam: Patient has blunt affect.   Assessment & Plan:   See Encounters Tab for problem based charting.  Patient seen with Dr. Daryll Drown

## 2018-06-04 NOTE — Assessment & Plan Note (Signed)
Was seen by a nephrologist on September 2019. No evidence of anemia and secondary hyper para due to CKD is reported.

## 2018-06-04 NOTE — Assessment & Plan Note (Signed)
Patient presented with 5 days history of bilateral swelling.  Reports some orthopnea. On exam has systolic murmur, bilateral moderate nonpitting edema up to mid tibia. Has by basilar fine crackle.  Normal JVP. Assessment: Patient has not taken his Lasix (is not sure for how long.  His wife believes that his nephrologist has a stopped that because it is not good for his kidney.  However per last nephrology's note, his symptom is improving with Lasix and no recommendation about stopping that was noted)  Patient generally has a very poor compliance to his medications and amlodipine and diltiazem are the only 2 medications he is taking currently. Plan: Current plan is resuming his medications based on priority as he has a poor compliance. explained to patient and his wife that he needs to be on Lasix.  -Resume Lasix 20 mg daily follow-up in clinic in 2 weeks for BMP and volume status evaluation

## 2018-06-04 NOTE — Assessment & Plan Note (Signed)
Essential hypertension: Uncontrolled.  Today BP: 162/76 (did not take his medication today yet as usually takes them in the evening) Has stopped most of his medications by his decision. Currently only take Cartia XT 300 mg QD and Amlodipin 10 mg QD (prescribed by his nephrologist Dr. Effie Shy recently, per her note, patient apparently had a stopped clonidine and diltiazem before the visit)  Used to be on hydralazine 50 mg 3 times daily Clonidine 0.3 mg 3 times daily Lasix 20 mg daily  -Patient has very poor compliance to his medications.  Plan to resume his medication by priority and gradually) -Continue amlodipine 10 mg daily and Cartia XT 300 mg daily -Resume Lasix 20 mg daily -Follow-up in clinic in 2 weeks for evaluation and labs

## 2018-06-04 NOTE — Assessment & Plan Note (Signed)
Patient is not taking any medications/insulin for his diabetes.  CBG today came back 153.  Hemoglobin A1c today 7.4.  (Surprisingly better than previous A1c despite not being on any DM medicines)  -Due to poor compliance to his medications, I prioritize his medications that needs to be resumed.  We will not resume his insulin today, but should be considered in next follow-up visits. -Follow-up in clinic in 2 weeks

## 2018-06-05 ENCOUNTER — Other Ambulatory Visit: Payer: Self-pay | Admitting: Internal Medicine

## 2018-06-05 ENCOUNTER — Other Ambulatory Visit: Payer: Self-pay | Admitting: *Deleted

## 2018-06-05 ENCOUNTER — Telehealth: Payer: Self-pay

## 2018-06-05 NOTE — Telephone Encounter (Signed)
Pt 's caregiver called to ask what [facility] for them to pick up, she was informed that the coumadin was sent by dr Myrtie Hawk 10/6. She then ask about the lasix and informed request would be sent to dr Myrtie Hawk she then states he has lasix at home then she ask about refills for amlodipine and was informed per dr Darcey Nora note that dr dr Moshe Cipro had prescribed that med and she stated "so we cant get it at [facility]?" triage informed her that as long as pt had refills on amlodipine at [facility] he could get it there. Dr Myrtie Hawk, would you please add the amlodipine to the med list with a note that dr Moshe Cipro refills that med. Thank you

## 2018-06-05 NOTE — Telephone Encounter (Signed)
Needs to speak with a nurse about meds. Please call pt back.

## 2018-06-06 NOTE — Progress Notes (Signed)
Internal Medicine Clinic Attending  I saw and evaluated the patient.  I personally confirmed the key portions of the history and exam documented by Dr. [name] and I reviewed pertinent patient test results.  The assessment, diagnosis, and plan were formulated together and I agree with the documentation in the resident's note. 

## 2018-06-16 ENCOUNTER — Ambulatory Visit (INDEPENDENT_AMBULATORY_CARE_PROVIDER_SITE_OTHER): Payer: Medicare Other | Admitting: Internal Medicine

## 2018-06-16 ENCOUNTER — Other Ambulatory Visit: Payer: Self-pay

## 2018-06-16 ENCOUNTER — Ambulatory Visit: Payer: Medicare Other | Admitting: Pharmacist

## 2018-06-16 ENCOUNTER — Encounter: Payer: Self-pay | Admitting: Internal Medicine

## 2018-06-16 VITALS — BP 178/70 | HR 66 | Temp 97.9°F | Ht 66.0 in | Wt 180.0 lb

## 2018-06-16 DIAGNOSIS — Z8673 Personal history of transient ischemic attack (TIA), and cerebral infarction without residual deficits: Secondary | ICD-10-CM | POA: Diagnosis not present

## 2018-06-16 DIAGNOSIS — Z79899 Other long term (current) drug therapy: Secondary | ICD-10-CM

## 2018-06-16 DIAGNOSIS — I5032 Chronic diastolic (congestive) heart failure: Secondary | ICD-10-CM | POA: Diagnosis not present

## 2018-06-16 DIAGNOSIS — I4821 Permanent atrial fibrillation: Secondary | ICD-10-CM | POA: Diagnosis not present

## 2018-06-16 DIAGNOSIS — I6381 Other cerebral infarction due to occlusion or stenosis of small artery: Secondary | ICD-10-CM

## 2018-06-16 DIAGNOSIS — I480 Paroxysmal atrial fibrillation: Secondary | ICD-10-CM | POA: Diagnosis not present

## 2018-06-16 DIAGNOSIS — I10 Essential (primary) hypertension: Secondary | ICD-10-CM

## 2018-06-16 DIAGNOSIS — Z7901 Long term (current) use of anticoagulants: Secondary | ICD-10-CM

## 2018-06-16 DIAGNOSIS — I110 Hypertensive heart disease with heart failure: Secondary | ICD-10-CM | POA: Diagnosis not present

## 2018-06-16 DIAGNOSIS — Z5181 Encounter for therapeutic drug level monitoring: Secondary | ICD-10-CM

## 2018-06-16 DIAGNOSIS — I63219 Cerebral infarction due to unspecified occlusion or stenosis of unspecified vertebral arteries: Secondary | ICD-10-CM

## 2018-06-16 DIAGNOSIS — E113419 Type 2 diabetes mellitus with severe nonproliferative diabetic retinopathy with macular edema, unspecified eye: Secondary | ICD-10-CM

## 2018-06-16 DIAGNOSIS — I6322 Cerebral infarction due to unspecified occlusion or stenosis of basilar arteries: Secondary | ICD-10-CM

## 2018-06-16 DIAGNOSIS — Z87891 Personal history of nicotine dependence: Secondary | ICD-10-CM

## 2018-06-16 DIAGNOSIS — E119 Type 2 diabetes mellitus without complications: Secondary | ICD-10-CM

## 2018-06-16 LAB — POCT INR: INR: 3.8 — AB (ref 2.0–3.0)

## 2018-06-16 MED ORDER — FUROSEMIDE 40 MG PO TABS: 40.0000 mg | ORAL_TABLET | Freq: Every day | ORAL | 0 refills | Status: DC

## 2018-06-16 MED ORDER — WARFARIN SODIUM 5 MG PO TABS: ORAL_TABLET | ORAL | 2 refills | Status: DC

## 2018-06-16 NOTE — Progress Notes (Signed)
Anticoagulation Management Patrick [name] is a 72 y.o. male who reports to the clinic for monitoring of warfarin treatment.    Indication: atrial fibrillation, paroxysmal, chronic (Resolved)[148.91], Acute ischemic VBA thalamic stroke ([place]) [163.22], Chronic long term use of anticoagulants.  Duration: indefinite Supervising physician: Joni Reining  Anticoagulation Clinic Visit History: Patient does not report signs/symptoms of bleeding or thromboembolism  Other recent changes: No diet, medications, lifestyle changes endorsed.   Anticoagulation Episode Summary    Current INR goal:   2.0-3.0  TTR:   57.7 % (4.8 y)  Next INR check:   06/23/2018  INR from last check:   3.8! (06/16/2018)  Weekly max warfarin dose:     Target end date:   Indefinite  INR check location:   Anticoagulation Clinic  Preferred lab:     Send INR reminders to:      Indications   ATRIAL FIBRILLATION PAROXYSMAL CHRONIC (Resolved) [I48.91] Acute ischemic VBA thalamic stroke (HCC) [A63.016 I63.22] Chronic anticoagulation on coumadin [Z79.01]       Comments:          No Known Allergies Medication Sig  amLODipine (NORVASC) 10 MG tablet Take 10 mg by mouth daily.  Blood Glucose Monitoring Suppl (ONETOUCH VERIO) w/Device KIT 1 strip 4 (four) times daily by Does not apply route. Pharmacist, if not covered, provide [facility] Relion Meter, please counsel  diclofenac sodium (VOLTAREN) 1 % GEL Apply 4 g topically 4 (four) times daily.  diltiazem (CARTIA XT) 300 MG 24 hr capsule Take 1 capsule (300 mg total) by mouth daily.  docusate sodium (COLACE) 100 MG capsule Take 100 mg by mouth 2 (two) times daily.  furosemide (LASIX) 40 MG tablet Take 1 tablet (40 mg total) by mouth daily.  glucose blood (ONE TOUCH ULTRA TEST) test strip Use four times daily before meals and at bedtime. Diagnosis code W10.9323  ONETOUCH DELICA LANCETS 55D MISC 1 strip 4 (four) times daily -  before meals and at bedtime by Does not apply route.   warfarin (COUMADIN) 5 MG tablet Take 1 tablet (5 mg total) by mouth daily at 6 PM. Except take 1/2 tablet on Monday, Wednesday, Friday.   Past Medical History:  Diagnosis Date  . Atrial fibrillation ([place])   . CAD (coronary artery disease)   . Chronic anticoagulation, on coumadin 07/17/2013  . CKD (chronic kidney disease), stage II   . 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 Depression   . Diabetes mellitus type II    Insulin dependent  . GERD (gastroesophageal reflux disease)   . Gout   . Hyperlipidemia   . Hypertension   . Myocardial infarction [facility]) 2000's   "near heart attack" (07/14/2013)  . Paroxysmal atrial fibrillation (HCC)    on coumadin  . Permanent atrial fibrillation 07/17/2013  . Shortness of breath    "can happen at any time" (07/14/2013)  . Sleep apnea 07/2010   "has mask at home; seldom uses it" (07/14/2013)  . Stomach ulcer 1950's   "as a teenager"  . Stroke [facility]) ~ 2007; ~ 2009   "memory not as good since" (07/14/2013)  . Tachy-brady syndrome ([place]) 07/17/2013  . Upper GI bleeding 07/01/2013   Social History   Socioeconomic History  . Marital status: Married    Spouse name: Not on file  . Number of children: Not on file  . Years of education: 5  . Highest education level: Not on file  Occupational History  .  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 Financial resource strain: Not on file  . Food insecurity:    Worry: Not on file    Inability: Not on file  . Transportation needs:    Medical: Not on file    Non-medical: Not on file  Tobacco Use  . Smoking status: Former Smoker    Packs/day: 0.50    Years: 15.00    Pack years: 7.50    Types: Cigarettes    Last attempt to quit: 10/26/1976    Years since quitting: 41.6  . Smokeless tobacco: Former Systems developer  . Tobacco comment: 07/14/2013 "quit chewing and dipping in ~ 1970"  Substance and Sexual Activity  . Alcohol use: No     Alcohol/week: 0.0 standard drinks    Comment: 07/14/2013 "drank a little; quit in ~ 1970; never had problem w/it"  . Drug use: No  . Sexual activity: Not Currently  Lifestyle  . Physical activity:    Days per week: Not on file    Minutes per session: Not on file  . Stress: Not on file  Relationships  . Social connections:    Talks on phone: Not on file    Gets together: Not on file    Attends religious service: Not on file    Active member of club or organization: Not on file    Attends meetings of clubs or organizations: Not on file    Relationship status: Not on file  Other Topics Concern  . Not on file  Social History Narrative   Admitted to [facility] 12/09/15   Patrick [name]   Former smoker - stopped 1978   Alcohol stopped 1970   Full Code   Family History  Problem Relation Age of Onset  . Diabetes Mother   . Hypertension Mother   . Heart Problems Father   . Cancer Brother   . Hypertension Sister   . Diabetes Sister    ASSESSMENT Recent Results: Lab Results  Component Value Date   INR 3.8 (A) 06/16/2018   INR 1.0 09/23/2017   INR 2.20 08/26/2017   Anticoagulation Dosing: Description   Take 1 tablet by mouth once-daily at 6PM all days of week--EXCEPT on Mondays, Tuesdays, Wednesdays and Thursdays, take 1&1/2 tablets of your 57m peach-colored warfarin tablets on Mondays, Tuesdays, Wednesdays and Thursdays.      INR today: Supratherapeutic  PLAN Weekly dose was decreased by 20%  Patient Instructions  It was a pleasure meeting you today, Patrick [name]  1. For your Coumadin - Take 1/2 tablet on Monday, Wednesday, and Friday. Take a full tablet every other day of the week. - Please schedule an appointment for coumadin clinic in 2 weeks  2. For your high blood pressure - Increase your lasix to 449mdaily  - Follow-up in 2 weeks. At that time we will recheck your blood pressure and get some lab work.  Feel free to call our clinic at [phone] you  have any questions.  Thanks, Dr. DoAnnie ParasFollow-up Return in about 2 weeks (around 06/30/2018).  [name]

## 2018-06-16 NOTE — Progress Notes (Signed)
Patient was seen today in a co-visit with Dr. Annie Paras

## 2018-06-16 NOTE — Assessment & Plan Note (Signed)
At his last visit, Patrick [name] was found to be volume overloaded and was re-started on lasix 20mg  daily. He reports that since restarting the lasix, the swelling in his legs has improved and he has less pain. He denies any chest pain or dyspnea.   Assessment: Patrick [name] continues to have signs of volume overload including faint bibasilar crackles and 1+ lower extremity pitting edema. However, he is down 8lbs since his visit 2 weeks ago and reports interval improvement in his lower extremity edema. As he is still volume up and hypertensive, will increase his lasix to 40mg  daily.   Plan 1. Increase lasix to 40mg  daily 2. F/u in 2 weeks for volume assessment

## 2018-06-16 NOTE — Assessment & Plan Note (Signed)
A1c on 06/04/2018 was 7.4. The patient is currently not on any therapy for his diabetes. Given his age and other comorbidities, I think an appropriate A1c goal for him is 7.5. He does not currently require Neurosurgeon.  Plan 1. Repeat A1c in 3 months

## 2018-06-16 NOTE — Patient Instructions (Signed)
It was a pleasure meeting you today, Mr. Warrell.  1. For your Coumadin - Take 1/2 tablet on Monday, Wednesday, and Friday. Take a full tablet every other day of the week. - Please schedule an appointment for coumadin clinic in 2 weeks  2. For your high blood pressure - Increase your lasix to 40mg  daily  - Follow-up in 2 weeks. At that time we will recheck your blood pressure and get some lab work.  Feel free to call our clinic at [phone] if you have any questions.  Thanks, Dr. Annie Paras

## 2018-06-16 NOTE — Progress Notes (Signed)
   CC: BP and INR check  HPI:   [name] is a 72 y.o. with a history of HTN, DM, and afib on coumadin as well as the other medical conditions listed below who presents to the internal medicine clinic for BP and INR check. Please see problem based charting for the history and status of the patient's current and chronic medical conditions.   Past Medical History:  Diagnosis Date  . Atrial fibrillation ([place])   . CAD (coronary artery disease)   . Chronic anticoagulation, on coumadin 07/17/2013  . CKD (chronic kidney disease), stage II   . 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 Depression   . Diabetes mellitus type II    Insulin dependent  . GERD (gastroesophageal reflux disease)   . Gout   . Hyperlipidemia   . Hypertension   . Myocardial infarction [facility]) 2000's   "near heart attack" (07/14/2013)  . Paroxysmal atrial fibrillation (HCC)    on coumadin  . Permanent atrial fibrillation 07/17/2013  . Shortness of breath    "can happen at any time" (07/14/2013)  . Sleep apnea 07/2010   "has mask at home; seldom uses it" (07/14/2013)  . Stomach ulcer 1950's   "as a teenager"  . Stroke [facility]) ~ 2007; ~ 2009   "memory not as good since" (07/14/2013)  . Tachy-brady syndrome ([place]) 07/17/2013  . Upper GI bleeding 07/01/2013    Review of Systems:   Pertinent positives mentioned in HPI. Remainder of all ROS negative.  Physical Exam: Vitals:   06/16/18 1439  BP: (!) 178/70  Pulse: 66  Temp: 97.9 F (36.6 C)  TempSrc: Oral  SpO2: 100%  Weight: 180 lb (81.6 kg)  Height: 5\' 6"  (1.676 m)   Physical Exam  Constitutional: Well-developed, well-nourished, and in no distress.  Eyes: Pupils are equal, round, and reactive to light. EOM are normal.  Cardiovascular: Normal rate and regular rhythm. No murmurs, rubs, or gallops. Mild JVD present. Pulmonary/Chest: Effort normal. Faint bibasilar crackles. Abdominal: Bowel sounds present.  Soft, non-distended, non-tender. Ext: 1+ pitting edema bilateral lower extremities. Skin: Warm and dry. No rashes or wounds.   Assessment & Plan:   See Encounters Tab for problem based charting.  Patient seen with Dr. Daryll Drown

## 2018-06-16 NOTE — Assessment & Plan Note (Addendum)
BP is elevated at 178/70 today. Patrick [name] reports that he has been taking diltiazem 300mg , amlodipine 10mg , and lasix 20mg  daily all at night. He has not yet had his BP medications today. He also "ate a pot-pie" a few hours before his visit, to which he and his wife attribute his high blood pressure. He does not check his Bps at home. He reports mild headache, but denies chest pain or dyspnea. He reports chronic vision changes, but nothing new.  Plan 1. Continue diltiazem 300mg  and amlodipine 10mg  2. Increase to lasix 40mg  daily 3. I encouraged that he take his antihypertensives in the morning so we can see their effect more clearly during the day. 4. F/u in 2 weeks for BP check

## 2018-06-16 NOTE — Assessment & Plan Note (Signed)
Patient was restarted on coumadin 2 weeks ago at 5mg  daily. INR today is 3.8. Will decrease to 2.5mg  Monday, Wednesday, Friday and 5mg  every other day of the week. This plan was discussed with Dr. Maudie Mercury with pharmacy. The patient has no signs or symptoms of bleeding at present. He was advised to f/u in 2 weeks for repeat INR or sooner if he develops bleeding or other concerning symptoms.  Plan 1. Coumadin 2.5mg  Monday, Wednesday, Friday and 5mg  every other day of the week 2. F/u in 2 weeks for repeat INR

## 2018-06-18 NOTE — Progress Notes (Signed)
Internal Medicine Clinic Attending  I saw and evaluated the patient.  I personally confirmed the key portions of the history and exam documented by Dr. Dorrell and I reviewed pertinent patient test results.  The assessment, diagnosis, and plan were formulated together and I agree with the documentation in the resident's note. 

## 2018-06-20 ENCOUNTER — Other Ambulatory Visit: Payer: Self-pay | Admitting: Internal Medicine

## 2018-06-20 DIAGNOSIS — I10 Essential (primary) hypertension: Secondary | ICD-10-CM

## 2018-06-30 ENCOUNTER — Encounter: Payer: Self-pay | Admitting: Internal Medicine

## 2018-06-30 ENCOUNTER — Telehealth: Payer: Self-pay | Admitting: *Deleted

## 2018-06-30 ENCOUNTER — Ambulatory Visit (INDEPENDENT_AMBULATORY_CARE_PROVIDER_SITE_OTHER): Payer: Medicare Other | Admitting: Pharmacist

## 2018-06-30 ENCOUNTER — Other Ambulatory Visit: Payer: Self-pay

## 2018-06-30 ENCOUNTER — Ambulatory Visit (INDEPENDENT_AMBULATORY_CARE_PROVIDER_SITE_OTHER): Payer: Medicare Other | Admitting: Internal Medicine

## 2018-06-30 VITALS — BP 174/103 | HR 89 | Temp 97.9°F | Ht 66.0 in | Wt 170.0 lb

## 2018-06-30 DIAGNOSIS — I129 Hypertensive chronic kidney disease with stage 1 through stage 4 chronic kidney disease, or unspecified chronic kidney disease: Secondary | ICD-10-CM | POA: Diagnosis not present

## 2018-06-30 DIAGNOSIS — I6381 Other cerebral infarction due to occlusion or stenosis of small artery: Secondary | ICD-10-CM

## 2018-06-30 DIAGNOSIS — Z8679 Personal history of other diseases of the circulatory system: Secondary | ICD-10-CM | POA: Diagnosis not present

## 2018-06-30 DIAGNOSIS — Z87891 Personal history of nicotine dependence: Secondary | ICD-10-CM | POA: Diagnosis not present

## 2018-06-30 DIAGNOSIS — Z8673 Personal history of transient ischemic attack (TIA), and cerebral infarction without residual deficits: Secondary | ICD-10-CM | POA: Diagnosis not present

## 2018-06-30 DIAGNOSIS — I4821 Permanent atrial fibrillation: Secondary | ICD-10-CM

## 2018-06-30 DIAGNOSIS — Z5181 Encounter for therapeutic drug level monitoring: Secondary | ICD-10-CM

## 2018-06-30 DIAGNOSIS — N182 Chronic kidney disease, stage 2 (mild): Secondary | ICD-10-CM

## 2018-06-30 DIAGNOSIS — Z7901 Long term (current) use of anticoagulants: Secondary | ICD-10-CM

## 2018-06-30 DIAGNOSIS — Z79899 Other long term (current) drug therapy: Secondary | ICD-10-CM | POA: Diagnosis not present

## 2018-06-30 DIAGNOSIS — M7989 Other specified soft tissue disorders: Secondary | ICD-10-CM

## 2018-06-30 DIAGNOSIS — R0601 Orthopnea: Secondary | ICD-10-CM | POA: Diagnosis not present

## 2018-06-30 DIAGNOSIS — I63219 Cerebral infarction due to unspecified occlusion or stenosis of unspecified vertebral arteries: Secondary | ICD-10-CM

## 2018-06-30 DIAGNOSIS — I10 Essential (primary) hypertension: Secondary | ICD-10-CM

## 2018-06-30 DIAGNOSIS — I6322 Cerebral infarction due to unspecified occlusion or stenosis of basilar arteries: Secondary | ICD-10-CM

## 2018-06-30 LAB — POCT INR: INR: 2.9 (ref 2.0–3.0)

## 2018-06-30 MED ORDER — CARVEDILOL 3.125 MG PO TABS: 3.1250 mg | ORAL_TABLET | Freq: Two times a day (BID) | ORAL | 2 refills | Status: DC

## 2018-06-30 NOTE — Assessment & Plan Note (Signed)
Patient presented for an INR check today. His INR last visit was 3.8 and he was instructed to start taking 2.5 mg of Coumadin MWF and 5 mg every other day. His INR was 2.9 today. Instructed to take 1 tablet every Sunday, Tuesday and Thursday and half a tablet every Monday, Wednesday, Friday and Saturday.  -follow up in 4 weeks and recheck INR

## 2018-06-30 NOTE — Patient Instructions (Signed)
Patient instructed to take medications as defined in the Anti-coagulation Track section of this encounter.  Patient instructed to take today's dose.  Patient instructed to take one (1) of your 5mg  peach-colored warfarin tablets by mouth at [facility] on Sundays, Tuesdays, and Thursdays. ALL OTHER DAYS (Mondays, Wednesdays, Fridays and Saturdays--take only one-half (1/2) tablet of your 5mg  peach-colored warfarin tablet.  Patient verbalized understanding of these instructions.

## 2018-06-30 NOTE — Assessment & Plan Note (Addendum)
BP is elevated today at 174/103. Patient reports that he takes diltiazem 300 mg, amlodipine 10 mg and lasix 40 mg daily at night. He denies any vision changes, headaches, chest pain, or dyspnea. He feels his LE edema has improved a little since his last visit. His nephrologist, Dr. Moshe Cipro recommended discontinuing diltiazem. Given his CKD and history of tachy-brady syndrome, we recommended he start low dose carvedilol.  -discontinued diltiazem 300 mg  -continue amlodipine 10 mg and furosemide 40 mg  -start carvedilol 3.125 mg BID  -f/u in 4 weeks

## 2018-06-30 NOTE — Progress Notes (Signed)
Anticoagulation Management Patrick [name] is a 72 y.o. male who reports to the clinic for monitoring of warfarin treatment.    Indication: atrial fibrillation, long term current use of anticoagulant.   Duration: indefinite Supervising physician: Aldine Contes  Anticoagulation Clinic Visit History: Patient does not report signs/symptoms of bleeding or thromboembolism  Other recent changes: No diet, medications, lifestyle changes endorsed by the patient to me.  Anticoagulation Episode Summary    Current INR goal:   2.0-3.0  TTR:   57.3 % (4.8 y)  Next INR check:   07/28/2018  INR from last check:   2.9 (06/30/2018)  Weekly max warfarin dose:     Target end date:   Indefinite  INR check location:   Anticoagulation Clinic  Preferred lab:     Send INR reminders to:      Indications   ATRIAL FIBRILLATION PAROXYSMAL CHRONIC (Resolved) [I48.91] Acute ischemic VBA thalamic stroke (HCC) [J57.017 I63.22] Chronic anticoagulation on coumadin [Z79.01]       Comments:           No Known Allergies Prior to Admission medications   Medication Sig Start Date End Date Taking? Authorizing Provider  amLODipine (NORVASC) 10 MG tablet Take 10 mg by mouth daily. 05/07/18  Yes [provider]  Blood Glucose Monitoring Suppl (ONETOUCH VERIO) w/Device KIT 1 strip 4 (four) times daily by Does not apply route. Pharmacist, if not covered, provide [employer] Meter, please counsel 06/10/17  Yes [name], MD  carvedilol (COREG) 3.125 MG tablet Take 1 tablet (3.125 mg total) by mouth 2 (two) times daily with a meal. 06/30/18  Yes Rehman, Areeg N, DO  diclofenac sodium (VOLTAREN) 1 % GEL Apply 4 g topically 4 (four) times daily. 11/22/15  Yes Liberty Handy, MD  docusate sodium (COLACE) 100 MG capsule Take 100 mg by mouth 2 (two) times daily.   Yes [provider]  furosemide (LASIX) 40 MG tablet Take 1 tablet (40 mg total) by mouth daily. 06/16/18  Yes Dorrell, Andree Elk, MD   glucose blood (ONE TOUCH ULTRA TEST) test strip Use four times daily before meals and at bedtime. Diagnosis code B93.9030 06/13/17  Yes [name], MD  [facility] DELICA LANCETS 09Q MISC 1 strip 4 (four) times daily -  before meals and at bedtime by Does not apply route. 06/10/17  Yes [name], MD  warfarin (COUMADIN) 5 MG tablet Take 1 tablet (5 mg total) by mouth daily at 6 PM. Except take 1/2 tablet on Monday, Wednesday, Friday. 06/16/18  Yes Dorrell, Andree Elk, MD   Past Medical History:  Diagnosis Date  . Atrial fibrillation ([place])   . CAD (coronary artery disease)   . Chronic anticoagulation, on coumadin 07/17/2013  . CKD (chronic kidney disease), stage II   . 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 Depression   . Diabetes mellitus type II    Insulin dependent  . GERD (gastroesophageal reflux disease)   . Gout   . Hyperlipidemia   . Hypertension   . Myocardial infarction [facility]) 2000's   "near heart attack" (07/14/2013)  . Paroxysmal atrial fibrillation (HCC)    on coumadin  . Permanent atrial fibrillation 07/17/2013  . Shortness of breath    "can happen at any time" (07/14/2013)  . Sleep apnea 07/2010   "has mask at home; seldom uses it" (07/14/2013)  . Stomach ulcer 1950's   "as a teenager"  . Stroke [facility]) ~ 2007; ~  2009   "memory not as good since" (07/14/2013)  . Tachy-brady syndrome ([place]) 07/17/2013  . Upper GI bleeding 07/01/2013   Social History   Socioeconomic History  . Marital status: Married    Spouse name: Not on file  . Number of children: Not on file  . Years of education: 5  . Highest education level: Not on file  Occupational History  . 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 Financial resource strain: Not on file  . Food insecurity:    Worry: Not on file    Inability: Not on file  . Transportation needs:    Medical: Not on file    Non-medical: Not on file   Tobacco Use  . Smoking status: Former Smoker    Packs/day: 0.50    Years: 15.00    Pack years: 7.50    Types: Cigarettes    Last attempt to quit: 10/26/1976    Years since quitting: 41.7  . Smokeless tobacco: Former Systems developer  . Tobacco comment: 07/14/2013 "quit chewing and dipping in ~ 1970"  Substance and Sexual Activity  . Alcohol use: No    Alcohol/week: 0.0 standard drinks    Comment: 07/14/2013 "drank a little; quit in ~ 1970; never had problem w/it"  . Drug use: No  . Sexual activity: Not Currently  Lifestyle  . Physical activity:    Days per week: Not on file    Minutes per session: Not on file  . Stress: Not on file  Relationships  . Social connections:    Talks on phone: Not on file    Gets together: Not on file    Attends religious service: Not on file    Active member of club or organization: Not on file    Attends meetings of clubs or organizations: Not on file    Relationship status: Not on file  Other Topics Concern  . Not on file  Social History Narrative   Admitted to [facility] 12/09/15   [name]   Former smoker - stopped 1978   Alcohol stopped 1970   Full Code   Family History  Problem Relation Age of Onset  . Diabetes Mother   . Hypertension Mother   . Heart Problems Father   . Cancer Brother   . Hypertension Sister   . Diabetes Sister     ASSESSMENT Recent Results: The most recent result is correlated with 27.5 mg per week: Lab Results  Component Value Date   INR 2.9 06/30/2018   INR 3.8 (A) 06/16/2018   INR 1.0 09/23/2017    Anticoagulation Dosing: Description   Take one (1) of your '5mg'$  peach-colored warfarin tablets by mouth at [facility] on Sundays, Tuesdays, and Thursdays. ALL OTHER DAYS (Mondays, Wednesdays, Fridays and Saturdays--take only one-half (1/2) tablet of your '5mg'$  peach-colored warfarin tablet.      INR today: Therapeutic  PLAN Weekly dose was decreased by 9% to 25 mg per week  Patient Instructions  Patient  instructed to take medications as defined in the Anti-coagulation Track section of this encounter.  Patient instructed to take today's dose.  Patient instructed to take one (1) of your '5mg'$  peach-colored warfarin tablets by mouth at [facility] on Sundays, Tuesdays, and Thursdays. ALL OTHER DAYS (Mondays, Wednesdays, Fridays and Saturdays--take only one-half (1/2) tablet of your '5mg'$  peach-colored warfarin tablet.  Patient verbalized understanding of these instructions.     Patient advised to contact clinic or seek medical attention if signs/symptoms of  bleeding or thromboembolism occur.  Patient verbalized understanding by repeating back information and was advised to contact me if further medication-related questions arise. Patient was also provided an information handout.  Follow-up Return in 4 weeks (on 07/28/2018) for Follow up INR at 2:30PM.  Pennie Banter, PharmD, CPP  15 minutes spent face-to-face with the patient during the encounter. 50% of time spent on education. 50% of time was spent on fingerstick point of care INR sample collection, processing, results determination, dose adjustment and documentation in http://www.kim.net/.

## 2018-06-30 NOTE — Telephone Encounter (Signed)
Pt has now agreed to visit today

## 2018-06-30 NOTE — Progress Notes (Signed)
   CC: INR check, HTN  HPI:  [name] is a 72 y.o. with a PMHx listed below presenting for INR and BP check.   For details of today's visit and the status of his chronic medical issues please refer to the assessment and plan.   Past Medical History:  Diagnosis Date  . Atrial fibrillation ([place])   . CAD (coronary artery disease)   . Chronic anticoagulation, on coumadin 07/17/2013  . CKD (chronic kidney disease), stage II   . 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 Depression   . Diabetes mellitus type II    Insulin dependent  . GERD (gastroesophageal reflux disease)   . Gout   . Hyperlipidemia   . Hypertension   . Myocardial infarction [facility]) 2000's   "near heart attack" (07/14/2013)  . Paroxysmal atrial fibrillation (HCC)    on coumadin  . Permanent atrial fibrillation 07/17/2013  . Shortness of breath    "can happen at any time" (07/14/2013)  . Sleep apnea 07/2010   "has mask at home; seldom uses it" (07/14/2013)  . Stomach ulcer 1950's   "as a teenager"  . Stroke [facility]) ~ 2007; ~ 2009   "memory not as good since" (07/14/2013)  . Tachy-brady syndrome ([place]) 07/17/2013  . Upper GI bleeding 07/01/2013   Review of Systems:   Review of Systems  HENT: Negative for nosebleeds.   Respiratory: Negative for shortness of breath.   Cardiovascular: Positive for orthopnea and leg swelling. Negative for chest pain.  Gastrointestinal: Negative for abdominal pain, blood in stool, constipation and diarrhea.  Genitourinary: Negative for hematuria.  Neurological: Negative for dizziness, weakness and headaches.  Endo/Heme/Allergies: Does not bruise/bleed easily.    Physical Exam:  There were no vitals filed for this visit.  Physical Exam  Constitutional: He is oriented to person, place, and time and well-developed, well-nourished, and in no distress.  Eyes: Conjunctivae are normal. Right eye exhibits no discharge. Left eye  exhibits no discharge.  Cardiovascular: Normal rate, regular rhythm and normal heart sounds.  No murmur heard. Pulmonary/Chest: Breath sounds normal. No respiratory distress. He has no wheezes. He has no rales.  Abdominal: Soft. Bowel sounds are normal. He exhibits no distension. There is no tenderness.  Musculoskeletal: He exhibits edema.  1+ pitting edema bilateral LE   Neurological: He is alert and oriented to person, place, and time.  Skin: Skin is warm and dry.    Assessment & Plan:   See Encounters Tab for problem based charting.  Patient seen with Dr. Angelia Mould

## 2018-06-30 NOTE — Patient Instructions (Addendum)
Patrick [name],  It was a pleasure meeting you today! Please note the following changes we are making to your medications:   -STOP taking Diltiazem 300 mg every day -START taking Coreg (Carvedilol) 3.125 mg twice a day  Also, please note the following changes to your Coumadin:  -Take 1 tablet every Sunday, Tuesday and Thursday -Take half a tablet every Monday, Wednesday, Friday and Saturday  Call us if you have any trouble with the new medication or notice any new signs of bleeding.   Please follow up with [name] on 07/28/2018.

## 2018-06-30 NOTE — Telephone Encounter (Signed)
Pt has hit the donut hole, his ditiazem is going to be $80.00, he needs something much cheaper, pt's wife states he said he just wont take anything because he's not going to pay 32.00. Also he is not coming to appt today, wife does not give reason

## 2018-07-02 NOTE — Progress Notes (Signed)
INTERNAL MEDICINE TEACHING ATTENDING ADDENDUM - Erinn Mendosa M.D  Duration- indefinite, Indication- afib, INR- therapeutic. Agree with pharmacy recommendations as outlined in their note.     

## 2018-07-03 NOTE — Progress Notes (Signed)
Internal Medicine Clinic Attending  I saw and evaluated the patient.  I personally confirmed the key portions of the history and exam documented by Dr. Laural Golden and I reviewed pertinent patient test results.  The assessment, diagnosis, and plan were formulated together and I agree with the documentation in the resident's note.    As noted patient instructed to stop diltiazem when amlodipine was started, he did not however, he does have a history of tachybrady syndrome after Dx of A fib.  Therefore we will remove diltiazem and start low dose coreg with monitoring for bradycardia.

## 2018-07-28 ENCOUNTER — Ambulatory Visit: Payer: Medicare Other

## 2018-08-04 ENCOUNTER — Ambulatory Visit: Payer: Medicare Other

## 2018-08-08 ENCOUNTER — Other Ambulatory Visit: Payer: Self-pay | Admitting: Pharmacist

## 2018-08-08 DIAGNOSIS — I6381 Other cerebral infarction due to occlusion or stenosis of small artery: Secondary | ICD-10-CM

## 2018-08-08 DIAGNOSIS — I63219 Cerebral infarction due to unspecified occlusion or stenosis of unspecified vertebral arteries: Secondary | ICD-10-CM

## 2018-08-08 DIAGNOSIS — Z7901 Long term (current) use of anticoagulants: Secondary | ICD-10-CM

## 2018-08-08 DIAGNOSIS — I6322 Cerebral infarction due to unspecified occlusion or stenosis of basilar arteries: Secondary | ICD-10-CM

## 2018-08-11 ENCOUNTER — Ambulatory Visit: Payer: Medicare Other

## 2018-08-11 ENCOUNTER — Encounter: Payer: Self-pay | Admitting: Internal Medicine

## 2018-08-12 ENCOUNTER — Encounter (INDEPENDENT_AMBULATORY_CARE_PROVIDER_SITE_OTHER): Payer: Self-pay

## 2018-08-12 ENCOUNTER — Ambulatory Visit (INDEPENDENT_AMBULATORY_CARE_PROVIDER_SITE_OTHER): Payer: Medicare Other | Admitting: Pharmacist

## 2018-08-12 DIAGNOSIS — Z5181 Encounter for therapeutic drug level monitoring: Secondary | ICD-10-CM | POA: Diagnosis not present

## 2018-08-12 DIAGNOSIS — Z7901 Long term (current) use of anticoagulants: Secondary | ICD-10-CM | POA: Diagnosis not present

## 2018-08-12 DIAGNOSIS — Z8673 Personal history of transient ischemic attack (TIA), and cerebral infarction without residual deficits: Secondary | ICD-10-CM | POA: Diagnosis not present

## 2018-08-12 DIAGNOSIS — Z8679 Personal history of other diseases of the circulatory system: Secondary | ICD-10-CM

## 2018-08-12 DIAGNOSIS — I63219 Cerebral infarction due to unspecified occlusion or stenosis of unspecified vertebral arteries: Secondary | ICD-10-CM

## 2018-08-12 DIAGNOSIS — I6381 Other cerebral infarction due to occlusion or stenosis of small artery: Secondary | ICD-10-CM

## 2018-08-12 DIAGNOSIS — I6322 Cerebral infarction due to unspecified occlusion or stenosis of basilar arteries: Secondary | ICD-10-CM

## 2018-08-12 LAB — POCT INR: INR: 1.7 — AB (ref 2.0–3.0)

## 2018-08-12 MED ORDER — WARFARIN SODIUM 4 MG PO TABS
4.0000 mg | ORAL_TABLET | Freq: Every day | ORAL | 3 refills | Status: DC
Start: 2018-08-12 — End: 2018-12-04

## 2018-08-12 NOTE — Patient Instructions (Signed)
Patient educated about medication as defined in this encounter and verbalized understanding by repeating back instructions provided.   

## 2018-08-12 NOTE — Progress Notes (Signed)
Anticoagulation Management Patrick [name] is a 73 y.o. male who reports to the clinic for monitoring of warfarin treatment.    Indication: atrial fibrillation, long term current use of anticoagulant.   Duration: indefinite Supervising physician: Nischal Narendra  Anticoagulation Clinic Visit History: Patient does not report signs/symptoms of bleeding or thromboembolism  Other recent changes: No diet, medications, lifestyle changes endorsed by the patient to me.   Anticoagulation Episode Summary    Current INR goal:   2.0-3.0  TTR:   57.7 % (5 y)  Next INR check:   08/25/2018  INR from last check:   1.7! (08/12/2018)  Weekly max warfarin dose:     Target end date:   Indefinite  INR check location:   Anticoagulation Clinic  Preferred lab:     Send INR reminders to:      Indications   ATRIAL FIBRILLATION PAROXYSMAL CHRONIC (Resolved) [I48.91] Acute ischemic VBA thalamic stroke (HCC) [I63.219 I63.22] Chronic anticoagulation on coumadin [Z79.01]       Comments:          No Known Allergies Medication Sig  amLODipine (NORVASC) 10 MG tablet Take 10 mg by mouth daily.  Blood Glucose Monitoring Suppl (ONETOUCH VERIO) w/Device KIT 1 strip 4 (four) times daily by Does not apply route. Pharmacist, if not covered, provide [employer] Relion Meter, please counsel  carvedilol (COREG) 3.125 MG tablet Take 1 tablet (3.125 mg total) by mouth 2 (two) times daily with a meal.  diclofenac sodium (VOLTAREN) 1 % GEL Apply 4 g topically 4 (four) times daily.  docusate sodium (COLACE) 100 MG capsule Take 100 mg by mouth 2 (two) times daily.  furosemide (LASIX) 40 MG tablet Take 1 tablet (40 mg total) by mouth daily.  glucose blood (ONE TOUCH ULTRA TEST) test strip Use four times daily before meals and at bedtime. Diagnosis code E11.3419  ONETOUCH DELICA LANCETS 33G MISC 1 strip 4 (four) times daily -  before meals and at bedtime by Does not apply route.  warfarin (COUMADIN) 4 MG tablet Take 1 tablet  (4 mg total) by mouth daily.   Past Medical History:  Diagnosis Date  . Atrial fibrillation (HCC)   . CAD (coronary artery disease)   . Chronic anticoagulation, on coumadin 07/17/2013  . CKD (chronic kidney disease), stage II   . 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 Depression   . Diabetes mellitus type II    Insulin dependent  . GERD (gastroesophageal reflux disease)   . Gout   . Hyperlipidemia   . Hypertension   . Myocardial infarction (HCC) 2000's   "near heart attack" (07/14/2013)  . Paroxysmal atrial fibrillation (HCC)    on coumadin  . Permanent atrial fibrillation 07/17/2013  . Shortness of breath    "can happen at any time" (07/14/2013)  . Sleep apnea 07/2010   "has mask at home; seldom uses it" (07/14/2013)  . Stomach ulcer 1950's   "as a teenager"  . Stroke (HCC) ~ 2007; ~ 2009   "memory not as good since" (07/14/2013)  . Tachy-brady syndrome (HCC) 07/17/2013  . Upper GI bleeding 07/01/2013   Social History   Socioeconomic History  . Marital status: Married    Spouse name: Not on file  . Number of children: Not on file  . Years of education: 5  . Highest education level: Not on file  Occupational History  . Occupation: retired Textile    Employer: UNEMPLOYED  Social Needs  .   Financial resource strain: Not on file  . Food insecurity:    Worry: Not on file    Inability: Not on file  . Transportation needs:    Medical: Not on file    Non-medical: Not on file  Tobacco Use  . Smoking status: Former Smoker    Packs/day: 0.50    Years: 15.00    Pack years: 7.50    Types: Cigarettes    Last attempt to quit: 10/26/1976    Years since quitting: 41.8  . Smokeless tobacco: Former User  . Tobacco comment: 07/14/2013 "quit chewing and dipping in ~ 1970"  Substance and Sexual Activity  . Alcohol use: No    Alcohol/week: 0.0 standard drinks    Comment: 07/14/2013 "drank a little; quit in ~ 1970; never had  problem w/it"  . Drug use: No  . Sexual activity: Not Currently  Lifestyle  . Physical activity:    Days per week: Not on file    Minutes per session: Not on file  . Stress: Not on file  Relationships  . Social connections:    Talks on phone: Not on file    Gets together: Not on file    Attends religious service: Not on file    Active member of club or organization: Not on file    Attends meetings of clubs or organizations: Not on file    Relationship status: Not on file  Other Topics Concern  . Not on file  Social History Narrative   Admitted to [facility] 12/09/15   Married-Patrick [name]   Former smoker - stopped 1978   Alcohol stopped 1970   Full Code   Family History  Problem Relation Age of Onset  . Diabetes Mother   . Hypertension Mother   . Heart Problems Father   . Cancer Brother   . Hypertension Sister   . Diabetes Sister    ASSESSMENT Lab Results  Component Value Date   INR 1.7 (A) 08/12/2018   INR 2.9 06/30/2018   INR 3.8 (A) 06/16/2018   Anticoagulation Dosing: Description   Take one (1) of your 5mg peach-colored warfarin tablets by mouth at 6PM on Sundays, Tuesdays, and Thursdays. ALL OTHER DAYS (Mondays, Wednesdays, Fridays and Saturdays--take only one-half (1/2) tablet of your 5mg peach-colored warfarin tablet.      INR today: Subtherapeutic  PLAN Weekly dose was increased by 12% to 28 mg per week  Patient Instructions  Patient educated about medication as defined in this encounter and verbalized understanding by repeating back instructions provided.    Patient advised to contact clinic or seek medical attention if signs/symptoms of bleeding or thromboembolism occur.  Patient verbalized understanding by repeating back information and was advised to contact me if further medication-related questions arise. Patient was also provided an information handout.  Follow-up Return in about 2 weeks (around 08/26/2018).     15 minutes spent  face-to-face with the patient during the encounter. 50% of time spent on education. 50% of time was spent on assessment and plan.  

## 2018-08-13 NOTE — Progress Notes (Signed)
INTERNAL MEDICINE TEACHING ATTENDING ADDENDUM ° °I agree with pharmacy recommendations as outlined in their note.  ° °-Duncan Vincent MD ° °

## 2018-08-14 ENCOUNTER — Encounter: Payer: Self-pay | Admitting: Internal Medicine

## 2018-08-14 ENCOUNTER — Encounter: Payer: Medicare Other | Admitting: Internal Medicine

## 2018-08-14 MED ORDER — AMLODIPINE-ATORVASTATIN 10-40 MG PO TABS: 1.0000 | ORAL_TABLET | Freq: Every day | ORAL | 11 refills | Status: DC

## 2018-08-14 NOTE — Addendum Note (Signed)
Addended by: Forde Dandy on: 08/14/2018 03:42 PM   Modules accepted: Orders

## 2018-08-14 NOTE — Assessment & Plan Note (Deleted)
Last INR 2 days ago was 1.7. Patient was seen by out pharmacist Dr. Maudie Mercury, and for better compliance and less confusion for patient, Warfarin dose changed to 4 mg QD.  -INR today---> -.... Warfarin 4 mg daily (

## 2018-08-14 NOTE — Assessment & Plan Note (Deleted)
Today blood pressure is:.... Currently on amlodipine 10 mg daily, Lasix 40 mg daily, carvedilol 3.125 mg twice daily. Patient .Marland Kitchen..reports compliance to the meds.  -Continue....  -BMP.Marland KitchenMarland KitchenMarland Kitchen

## 2018-08-14 NOTE — Progress Notes (Deleted)
   CC: Follow up of atrial fibrillation/anticoagulation use    HPI:  Mr.Desiree Manzo is a 73 y.o. male with past medical history listed below, presented to clinic today for follow-up regarding anticoagulation use.  Please refer to problem based charting for further details and assessment and plan and status of other chronic conditions.   Past Medical History:  Diagnosis Date  . Atrial fibrillation ([place])   . CAD (coronary artery disease)   . Chronic anticoagulation, on coumadin 07/17/2013  . CKD (chronic kidney disease), stage II   . 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 Depression   . Diabetes mellitus type II    Insulin dependent  . GERD (gastroesophageal reflux disease)   . Gout   . Hyperlipidemia   . Hypertension   . Myocardial infarction [facility]) 2000's   "near heart attack" (07/14/2013)  . Paroxysmal atrial fibrillation (HCC)    on coumadin  . Permanent atrial fibrillation 07/17/2013  . Shortness of breath    "can happen at any time" (07/14/2013)  . Sleep apnea 07/2010   "has mask at home; seldom uses it" (07/14/2013)  . Stomach ulcer 1950's   "as a teenager"  . Stroke [facility]) ~ 2007; ~ 2009   "memory not as good since" (07/14/2013)  . Tachy-brady syndrome ([place]) 07/17/2013  . Upper GI bleeding 07/01/2013   Review of Systems:ROS   Physical Exam:Physical Exam   There were no vitals filed for this visit. ***  Assessment & Plan:   See Encounters Tab for problem based charting.  Patient {GC/GE:[id number]::"discussed with","seen with"} Dr. {NAMES:[id number]::"Butcher","Granfortuna","E. Hoffman","Klima","Mullen","Narendra","Raines","Vincent"}

## 2018-08-14 NOTE — Addendum Note (Signed)
Addended by: Forde Dandy on: 08/14/2018 02:53 PM   Modules accepted: Orders

## 2018-08-15 NOTE — Progress Notes (Signed)
This encounter was created in error - please disregard.

## 2018-09-05 ENCOUNTER — Telehealth: Payer: Self-pay | Admitting: *Deleted

## 2018-09-05 ENCOUNTER — Telehealth: Payer: Self-pay

## 2018-09-05 DIAGNOSIS — I10 Essential (primary) hypertension: Secondary | ICD-10-CM

## 2018-09-05 NOTE — Telephone Encounter (Signed)
Pt's wife calls and ask why pt was prescribed amlodipine/ atorvastatin (caduet) 10/40 when he has never taken a statin before. She would prefer that he not take this until she speaks to md either by md calling her or at the appt in march. Please advise

## 2018-09-05 NOTE — Telephone Encounter (Signed)
Spoke w/pt's wife.

## 2018-09-05 NOTE — Telephone Encounter (Signed)
Needs to speak with a nurse about amLODipine-atorvastatin (CADUET) 10-40 MG tablet. Please call pt back.

## 2018-09-09 MED ORDER — AMLODIPINE BESYLATE 10 MG PO TABS: 10.0000 mg | ORAL_TABLET | Freq: Every day | ORAL | 11 refills | Status: DC

## 2018-10-02 ENCOUNTER — Encounter: Payer: Self-pay | Admitting: Internal Medicine

## 2018-10-02 ENCOUNTER — Other Ambulatory Visit: Payer: Self-pay

## 2018-10-02 ENCOUNTER — Ambulatory Visit (INDEPENDENT_AMBULATORY_CARE_PROVIDER_SITE_OTHER): Payer: Medicare Other | Admitting: Internal Medicine

## 2018-10-02 VITALS — BP 170/84 | HR 71 | Temp 98.0°F | Ht 66.0 in | Wt 171.6 lb

## 2018-10-02 DIAGNOSIS — Z9114 Patient's other noncompliance with medication regimen: Secondary | ICD-10-CM

## 2018-10-02 DIAGNOSIS — I5022 Chronic systolic (congestive) heart failure: Secondary | ICD-10-CM

## 2018-10-02 DIAGNOSIS — Z79899 Other long term (current) drug therapy: Secondary | ICD-10-CM

## 2018-10-02 DIAGNOSIS — Z7901 Long term (current) use of anticoagulants: Secondary | ICD-10-CM | POA: Diagnosis not present

## 2018-10-02 DIAGNOSIS — M7989 Other specified soft tissue disorders: Secondary | ICD-10-CM

## 2018-10-02 DIAGNOSIS — I5020 Unspecified systolic (congestive) heart failure: Secondary | ICD-10-CM | POA: Diagnosis not present

## 2018-10-02 DIAGNOSIS — I10 Essential (primary) hypertension: Secondary | ICD-10-CM

## 2018-10-02 DIAGNOSIS — Z87891 Personal history of nicotine dependence: Secondary | ICD-10-CM | POA: Diagnosis not present

## 2018-10-02 DIAGNOSIS — N184 Chronic kidney disease, stage 4 (severe): Secondary | ICD-10-CM | POA: Diagnosis not present

## 2018-10-02 DIAGNOSIS — I130 Hypertensive heart and chronic kidney disease with heart failure and stage 1 through stage 4 chronic kidney disease, or unspecified chronic kidney disease: Secondary | ICD-10-CM

## 2018-10-02 LAB — POCT INR: INR: 1.8 — AB (ref 2.0–3.0)

## 2018-10-02 NOTE — Assessment & Plan Note (Signed)
Will repeat BMP next time.

## 2018-10-02 NOTE — Assessment & Plan Note (Signed)
Patient with EF 40-45% per Echo on 2017. He has been prescribed Lasix 40 mg QD, amlodipine 10 mg daily, Coreg 3.125 mg twice daily.  Unclear if he takes all of his medications as instructed. (Per chart, he will need refill on Lasix, but he mentions that he has some refills and is going to go and pick it up from pharmacy) I recommended him to bring his medications here next time.  He denies any shortness of breath, chest pain, mentions that his lower extremity edema has been the same. On exam: Lung exam is normal, no JVD.  But has 2+ bilateral lower extremity pitting edema. Can be right-sided heart failure. He is not an optimal heart failure medication and may take benefit of getting BiDil, considering contraindication uppercase and are up due to CKD stage IV. However, due to unclear compliance, my current goal is achieving full compliance to the current medications before adding new medicine.   -Continue Lasix 40 mg QD -Continue amlodipine 10 mg daily -Continue Coreg 3.125 mg QD -F/u in clinic in 2 months (Schedul for 12/04/2018)

## 2018-10-02 NOTE — Patient Instructions (Addendum)
Thank you for allowing [name] to provide your care today. Your blood pressure is elevated today. Please make sure to take all of your medications on time and bring them to the clinic with you next time. Today we discussed your medications.  Please make sure to take your medications as instructed. We increased your Warfarin dose. Please make sure to take them as you educated.  Please come back to clinic in 2 weeks. As we discussed, it is very important to follow up frequently with [name], because you are on blood thinner and need INR check.  Should you have any questions or concerns please call the internal medicine clinic at [phone].

## 2018-10-02 NOTE — Assessment & Plan Note (Addendum)
Patrick [name] blood pressure during this visit was 185/93. He is supposed to be on Amlodipine 10 mg QD, Coreg 3.125 BID, Lasix 40 mg QD. He did not take his medications today. He has not bring his meds with him today and does not recall their name when I review their name with him. Although one of his medications prescribed as BID, he mentions that he only takes his meds at evening. I am concern he might not have taken all of his medications.  Per chart, he did not have refill on his Lasix but he mentions that he does not need refill and have some meds/refill left and is going to pick it up today.  The patient does not report palpitations, dizziness, chest pain, sob. Unctrolled HTN is likely due to his non adherence/confusion vs under treatment. I recommended to refill the medications today, and take them carefully as instructed and bring his medications next time in clinic. Continue: -Lasix 40 mg daily -Amlodipine 10 mg daily -Warfarin 4 mg daily -Reminded patient to refill his medications, take them regularly and bring them on next visit  -BMP next visit

## 2018-10-02 NOTE — Progress Notes (Signed)
   CC: Follow up for anticoagulation therapy  HPI:  [name] is a 73 y.o. past medical history listed below, came into clinic today for follow-up.  Please refer to problem based charting for further details and assessment and plan.  Past Medical History:  Diagnosis Date  . Atrial fibrillation ([place])   . CAD (coronary artery disease)   . Chronic anticoagulation, on coumadin 07/17/2013  . CKD (chronic kidney disease), stage II   . 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 Depression   . Diabetes mellitus type II    Insulin dependent  . GERD (gastroesophageal reflux disease)   . Gout   . Hyperlipidemia   . Hypertension   . Myocardial infarction [facility]) 2000's   "near heart attack" (07/14/2013)  . Paroxysmal atrial fibrillation (HCC)    on coumadin  . Permanent atrial fibrillation 07/17/2013  . Shortness of breath    "can happen at any time" (07/14/2013)  . Sleep apnea 07/2010   "has mask at home; seldom uses it" (07/14/2013)  . Stomach ulcer 1950's   "as a teenager"  . Stroke [facility]) ~ 2007; ~ 2009   "memory not as good since" (07/14/2013)  . Tachy-brady syndrome ([place]) 07/17/2013  . Upper GI bleeding 07/01/2013   Review of Systems:  Review of Systems  Respiratory: Negative for shortness of breath.   Cardiovascular: Positive for leg swelling. Negative for chest pain and palpitations.  Gastrointestinal: Negative for blood in stool, nausea and vomiting.  Genitourinary: Negative for hematuria.  Neurological: Negative for dizziness and headaches.    Physical Exam:  Vitals:   10/02/18 1542 10/02/18 1626  BP: (!) 185/93 (!) 170/84  Pulse: 73 71  Temp: 98 F (36.7 C)   TempSrc: Oral   SpO2: 100%   Weight: 171 lb 9.6 oz (77.8 kg)   Height: 5\' 6"  (1.676 m)    Physical Exam  Constitutional: No distress.  HENT:  Head: Normocephalic and atraumatic.  Eyes: Conjunctivae are normal.  Cardiovascular: Normal rate, regular  rhythm and normal heart sounds, no JVD Respiratory: Effort normal and breath sounds normal. No respiratory distress. No wheezes.  GI: Soft. Bowel sounds are normal. No distension. There is no tenderness.  Musculoskeletal: Bilateral 2+ LEE up to knees Neurological: Is alert. And at baseline Skin: Not diaphoretic. No erythema.  Psychiatric: Normal mood but has blunt affect and delayed response. Behavior is normal. Judgment and thought content normal.    Assessment & Plan:   See Encounters Tab for problem based charting.  Patient discussed with Dr. Eppie Gibson

## 2018-10-02 NOTE — Assessment & Plan Note (Addendum)
Patient has been on 4 mg Warfarin QD. Denies any bleeding. Patient is not completely aware that he is on blood thinner. I explained to him about that, and also about he needs to follow his appointments in clinic for regular INR check. -INR today-->1.8 -Pharmacist (Dr. Maudie Mercury) visited the patient, we agree to increase Warfarin to 4 mg every day except  Tuesday and Thursday. And take Warfarin 6 mg on Tuesday and Thursday.  -F/u in clinic in 2 weeks

## 2018-10-03 NOTE — Progress Notes (Signed)
Case discussed with Dr. Myrtie Hawk at the time of the visit.  We reviewed the resident's history and exam and pertinent patient test results.  I agree with the assessment, diagnosis and plan of care documented in the resident's note.  We continue to have concerns about medication adherence for several reasons.  He states he takes his medications at night, but is on one BID antihypertensive.  In addition, he should have run out of his lasix that was prescribed in November for only 1 1/2 months, yet he still has lasix left.  Before adding medications we will try to address any barriers he may have to medication adherence.  At the follow-up visit we will ask him how he remembers to take his medications (does he need a pill box), can he afford his medications (do we need to help him apply for medication assistance programs if he is on expensive medications that we can not find a cheaper alternative), and if he has any trouble with transportation to get his medications (consider switching to a pharmacy that delivers his medications).  This may help [name] better clarify what barriers he may be facing and give [name] some direction on what interventions may be necessary.

## 2018-10-08 ENCOUNTER — Telehealth: Payer: Self-pay | Admitting: *Deleted

## 2018-10-08 NOTE — Telephone Encounter (Signed)
Spouse called to confirm medications, went over medlist, ended call

## 2018-10-13 ENCOUNTER — Ambulatory Visit: Payer: Medicare Other | Admitting: Pharmacist

## 2018-10-15 ENCOUNTER — Encounter: Payer: Self-pay | Admitting: Pharmacist

## 2018-10-16 ENCOUNTER — Other Ambulatory Visit: Payer: Self-pay | Admitting: Pharmacist

## 2018-10-16 DIAGNOSIS — I63413 Cerebral infarction due to embolism of bilateral middle cerebral arteries: Secondary | ICD-10-CM

## 2018-10-16 DIAGNOSIS — Z7901 Long term (current) use of anticoagulants: Secondary | ICD-10-CM

## 2018-10-16 DIAGNOSIS — I6381 Other cerebral infarction due to occlusion or stenosis of small artery: Secondary | ICD-10-CM

## 2018-10-16 DIAGNOSIS — I4821 Permanent atrial fibrillation: Secondary | ICD-10-CM

## 2018-10-16 DIAGNOSIS — I6322 Cerebral infarction due to unspecified occlusion or stenosis of basilar arteries: Secondary | ICD-10-CM

## 2018-10-16 DIAGNOSIS — I63219 Cerebral infarction due to unspecified occlusion or stenosis of unspecified vertebral arteries: Secondary | ICD-10-CM

## 2018-10-16 NOTE — Progress Notes (Signed)
port 

## 2018-12-02 ENCOUNTER — Other Ambulatory Visit: Payer: Self-pay | Admitting: Pharmacist

## 2018-12-02 ENCOUNTER — Other Ambulatory Visit: Payer: Self-pay | Admitting: Internal Medicine

## 2018-12-02 DIAGNOSIS — I10 Essential (primary) hypertension: Secondary | ICD-10-CM

## 2018-12-02 DIAGNOSIS — Z7901 Long term (current) use of anticoagulants: Secondary | ICD-10-CM

## 2018-12-02 DIAGNOSIS — I63219 Cerebral infarction due to unspecified occlusion or stenosis of unspecified vertebral arteries: Secondary | ICD-10-CM

## 2018-12-02 DIAGNOSIS — I6381 Other cerebral infarction due to occlusion or stenosis of small artery: Secondary | ICD-10-CM

## 2018-12-02 DIAGNOSIS — I6322 Cerebral infarction due to unspecified occlusion or stenosis of basilar arteries: Secondary | ICD-10-CM

## 2018-12-04 ENCOUNTER — Telehealth: Payer: Self-pay | Admitting: *Deleted

## 2018-12-04 ENCOUNTER — Telehealth: Payer: Self-pay | Admitting: Pharmacist

## 2018-12-04 ENCOUNTER — Other Ambulatory Visit: Payer: Self-pay | Admitting: *Deleted

## 2018-12-04 DIAGNOSIS — I6322 Cerebral infarction due to unspecified occlusion or stenosis of basilar arteries: Secondary | ICD-10-CM

## 2018-12-04 DIAGNOSIS — Z7901 Long term (current) use of anticoagulants: Secondary | ICD-10-CM

## 2018-12-04 DIAGNOSIS — I4821 Permanent atrial fibrillation: Secondary | ICD-10-CM

## 2018-12-04 DIAGNOSIS — I63219 Cerebral infarction due to unspecified occlusion or stenosis of unspecified vertebral arteries: Secondary | ICD-10-CM

## 2018-12-04 DIAGNOSIS — I6381 Other cerebral infarction due to occlusion or stenosis of small artery: Secondary | ICD-10-CM

## 2018-12-04 DIAGNOSIS — I63413 Cerebral infarction due to embolism of bilateral middle cerebral arteries: Secondary | ICD-10-CM

## 2018-12-04 NOTE — Telephone Encounter (Signed)
See Dr Gladstone Pih telephone encounter from today 5/7.

## 2018-12-04 NOTE — Telephone Encounter (Signed)
-----   Message from [name], MD sent at 12/03/2018  5:25 PM EDT ----- Regarding: Warfarin refill Patient requested for warfarin refill. Seems like he has not come for checking INR recently.  Has an active-standing order for INR. Better to proceed for that before refill.  Thanks [employer]

## 2018-12-04 NOTE — Telephone Encounter (Signed)
Called, spoke with wife. Requested she take the patient for an OP INR on Monday 08-Dec-2018 to the [facility] at [address]	[place], [address]. [place]. States she will do so.

## 2018-12-17 ENCOUNTER — Telehealth: Payer: Self-pay

## 2018-12-30 ENCOUNTER — Other Ambulatory Visit: Payer: Self-pay | Admitting: *Deleted

## 2018-12-30 DIAGNOSIS — I10 Essential (primary) hypertension: Secondary | ICD-10-CM

## 2018-12-30 MED ORDER — CARVEDILOL 3.125 MG PO TABS: ORAL_TABLET | ORAL | 0 refills | Status: DC

## 2018-12-30 NOTE — Telephone Encounter (Signed)
Pharmacy requesting 90-supply on carvedilol 3.125mg  tabs.Despina Hidden Cassady6/2/20202:29 PM

## 2019-01-02 ENCOUNTER — Other Ambulatory Visit: Payer: Self-pay | Admitting: Internal Medicine

## 2019-01-02 DIAGNOSIS — I6381 Other cerebral infarction due to occlusion or stenosis of small artery: Secondary | ICD-10-CM

## 2019-01-02 DIAGNOSIS — Z7901 Long term (current) use of anticoagulants: Secondary | ICD-10-CM

## 2019-01-02 DIAGNOSIS — I63219 Cerebral infarction due to unspecified occlusion or stenosis of unspecified vertebral arteries: Secondary | ICD-10-CM

## 2019-01-19 ENCOUNTER — Ambulatory Visit (INDEPENDENT_AMBULATORY_CARE_PROVIDER_SITE_OTHER): Payer: Medicare Other | Admitting: Pharmacist

## 2019-01-19 ENCOUNTER — Other Ambulatory Visit: Payer: Self-pay

## 2019-01-19 DIAGNOSIS — I63219 Cerebral infarction due to unspecified occlusion or stenosis of unspecified vertebral arteries: Secondary | ICD-10-CM

## 2019-01-19 DIAGNOSIS — Z8673 Personal history of transient ischemic attack (TIA), and cerebral infarction without residual deficits: Secondary | ICD-10-CM | POA: Diagnosis not present

## 2019-01-19 DIAGNOSIS — Z8679 Personal history of other diseases of the circulatory system: Secondary | ICD-10-CM

## 2019-01-19 DIAGNOSIS — Z7901 Long term (current) use of anticoagulants: Secondary | ICD-10-CM | POA: Diagnosis not present

## 2019-01-19 DIAGNOSIS — Z5181 Encounter for therapeutic drug level monitoring: Secondary | ICD-10-CM

## 2019-01-19 DIAGNOSIS — I6381 Other cerebral infarction due to occlusion or stenosis of small artery: Secondary | ICD-10-CM

## 2019-01-19 LAB — POCT INR: INR: 2 (ref 2.0–3.0)

## 2019-01-19 MED ORDER — WARFARIN SODIUM 4 MG PO TABS: 4.0000 mg | ORAL_TABLET | Freq: Every day | ORAL | 0 refills | Status: DC

## 2019-01-19 NOTE — Progress Notes (Signed)
Anticoagulation Management Patrick Harmonis a 72 y.o.malewho reports to the clinic for monitoring of warfarintreatment.   Indication:atrial fibrillation, long term current use of anticoagulant.  Duration:indefinite Supervising physician:Erik Hoffman  Anticoagulation Clinic Visit History: Patientdoes notreport signs/symptoms of bleeding or thromboembolism  Other recent changes:Nodiet, medications, lifestylechanges endorsed by the patient to me.  Anticoagulation Episode Summary    Current INR goal:  2.0-3.0  TTR:  53.0 % (5.4 y)  Next INR check:  02/23/2019  INR from last check:  2.0 (01/19/2019)  Weekly max warfarin dose:    Target end date:  Indefinite  INR check location:  Anticoagulation Clinic  Preferred lab:    Send INR reminders to:     Indications   ATRIAL FIBRILLATION PAROXYSMAL CHRONIC (Resolved) [I48.91] Acute ischemic VBA thalamic stroke (HCC) [I63.219 I63.22] Chronic anticoagulation on coumadin [Z79.01]       Comments:         No Known Allergies  Current Outpatient Medications:  .  amLODipine (NORVASC) 10 MG tablet, Take 1 tablet (10 mg total) by mouth daily., Disp: 30 tablet, Rfl: 11 .  Blood Glucose Monitoring Suppl (ONETOUCH VERIO) w/Device KIT, 1 strip 4 (four) times daily by Does not apply route. Pharmacist, if not covered, provide [employer] Relion Meter, please counsel, Disp: 1 kit, Rfl: 0 .  carvedilol (COREG) 3.125 MG tablet, TAKE 1 TABLET BY MOUTH TWICE DAILY WITH A MEAL, Disp: 180 tablet, Rfl: 0 .  diclofenac sodium (VOLTAREN) 1 % GEL, Apply 4 g topically 4 (four) times daily., Disp: 1 Tube, Rfl: 3 .  docusate sodium (COLACE) 100 MG capsule, Take 100 mg by mouth 2 (two) times daily., Disp: , Rfl:  .  furosemide (LASIX) 40 MG tablet, Take 1 tablet (40 mg total) by mouth daily., Disp: 45 tablet, Rfl: 0 .  glucose blood (ONE TOUCH ULTRA TEST) test strip, Use four times daily before meals and at bedtime. Diagnosis code E11.3419, Disp: 100  each, Rfl: 12 .  ONETOUCH DELICA LANCETS 33G MISC, 1 strip 4 (four) times daily -  before meals and at bedtime by Does not apply route., Disp: 100 each, Rfl: 11 .  warfarin (COUMADIN) 4 MG tablet, Take 1 tablet (4 mg total) by mouth daily. Except 6 mg (1 and 1/2 tablets) on Tuesdays and Thursdays, Disp: 30 tablet, Rfl: 0 Past Medical History:  Diagnosis Date  . Atrial fibrillation (HCC)   . CAD (coronary artery disease)   . Chronic anticoagulation, on coumadin 07/17/2013  . CKD (chronic kidney disease), stage II   . 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 Depression   . Diabetes mellitus type II    Insulin dependent  . GERD (gastroesophageal reflux disease)   . Gout   . Hyperlipidemia   . Hypertension   . Myocardial infarction (HCC) 2000's   "near heart attack" (07/14/2013)  . Paroxysmal atrial fibrillation (HCC)    on coumadin  . Permanent atrial fibrillation 07/17/2013  . Shortness of breath    "can happen at any time" (07/14/2013)  . Sleep apnea 07/2010   "has mask at home; seldom uses it" (07/14/2013)  . Stomach ulcer 1950's   "as a teenager"  . Stroke (HCC) ~ 2007; ~ 2009   "memory not as good since" (07/14/2013)  . Tachy-brady syndrome (HCC) 07/17/2013  . Upper GI bleeding 07/01/2013   Social History   Socioeconomic History  . Marital status: Married    Spouse name: Not on file  .   Number of children: Not on file  . Years of education: 5  . Highest education level: Not on file  Occupational History  . Occupation: retired Textile    Employer: UNEMPLOYED  Social Needs  . Financial resource strain: Not on file  . Food insecurity    Worry: Not on file    Inability: Not on file  . Transportation needs    Medical: Not on file    Non-medical: Not on file  Tobacco Use  . Smoking status: Former Smoker    Packs/day: 0.50    Years: 15.00    Pack years: 7.50    Types: Cigarettes    Quit date: 10/26/1976    Years since  quitting: 42.2  . Smokeless tobacco: Former User  . Tobacco comment: 07/14/2013 "quit chewing and dipping in ~ 1970"  Substance and Sexual Activity  . Alcohol use: No    Alcohol/week: 0.0 standard drinks    Comment: 07/14/2013 "drank a little; quit in ~ 1970; never had problem w/it"  . Drug use: No  . Sexual activity: Not Currently  Lifestyle  . Physical activity    Days per week: Not on file    Minutes per session: Not on file  . Stress: Not on file  Relationships  . Social connections    Talks on phone: Not on file    Gets together: Not on file    Attends religious service: Not on file    Active member of club or organization: Not on file    Attends meetings of clubs or organizations: Not on file    Relationship status: Not on file  Other Topics Concern  . Not on file  Social History Narrative   Admitted to [facility] 12/09/15   Married-[name]   Former smoker - stopped 1978   Alcohol stopped 1970   Full Code   Family History  Problem Relation Age of Onset  . Diabetes Mother   . Hypertension Mother   . Heart Problems Father   . Cancer Brother   . Hypertension Sister   . Diabetes Sister    ASSESSMENT Lab Results  Component Value Date   INR 2.0 01/19/2019   INR 1.8 (A) 10/02/2018   INR 1.7 (A) 08/12/2018   Anticoagulation Dosing: 4 mg daily, except 6 mg on Tuesdays and Thursdays   INR today: Therapeutic  PLAN Weekly dose was unchanged  Patient advised to contact clinic or seek medical attention if signs/symptoms of bleeding or thromboembolism occur.  Patient verbalized understanding by repeating back information and was advised to contact me if further medication-related questions arise. Patient was also provided an information handout.  Follow-up Return in about 4 weeks (around 02/16/2019).     15 minutes spent face-to-face with the patient during the encounter. 50% of time spent on education, including signs/sx bleeding and clotting, as well  as food and drug interactions with warfarin. 50% of time was spent on fingerprick POC INR sample collection,processing, results determination, and documentation in [email or web address].  

## 2019-02-02 ENCOUNTER — Other Ambulatory Visit: Payer: Self-pay | Admitting: Internal Medicine

## 2019-02-02 DIAGNOSIS — Z7901 Long term (current) use of anticoagulants: Secondary | ICD-10-CM

## 2019-02-02 DIAGNOSIS — I63219 Cerebral infarction due to unspecified occlusion or stenosis of unspecified vertebral arteries: Secondary | ICD-10-CM

## 2019-02-02 DIAGNOSIS — I6381 Other cerebral infarction due to occlusion or stenosis of small artery: Secondary | ICD-10-CM

## 2019-02-02 DIAGNOSIS — I10 Essential (primary) hypertension: Secondary | ICD-10-CM

## 2019-02-23 ENCOUNTER — Ambulatory Visit: Payer: Medicare Other

## 2019-02-23 ENCOUNTER — Encounter: Payer: Self-pay | Admitting: Internal Medicine

## 2019-02-23 ENCOUNTER — Encounter: Payer: Medicare Other | Admitting: Internal Medicine

## 2019-03-06 ENCOUNTER — Other Ambulatory Visit: Payer: Self-pay | Admitting: Internal Medicine

## 2019-03-06 DIAGNOSIS — I10 Essential (primary) hypertension: Secondary | ICD-10-CM

## 2019-03-06 DIAGNOSIS — I6381 Other cerebral infarction due to occlusion or stenosis of small artery: Secondary | ICD-10-CM

## 2019-03-06 DIAGNOSIS — I63219 Cerebral infarction due to unspecified occlusion or stenosis of unspecified vertebral arteries: Secondary | ICD-10-CM

## 2019-03-06 DIAGNOSIS — Z7901 Long term (current) use of anticoagulants: Secondary | ICD-10-CM

## 2019-03-10 ENCOUNTER — Encounter: Payer: Self-pay | Admitting: *Deleted

## 2019-03-12 NOTE — Telephone Encounter (Signed)
Would you please make sure patient gets appointment in [facility] and Anti-coag clinic?  Thanks

## 2019-03-23 ENCOUNTER — Ambulatory Visit: Payer: Medicare Other

## 2019-03-26 ENCOUNTER — Encounter: Payer: Medicare Other | Admitting: Internal Medicine

## 2019-04-20 ENCOUNTER — Other Ambulatory Visit: Payer: Self-pay

## 2019-04-20 MED ORDER — FUROSEMIDE 40 MG PO TABS: 40.0000 mg | ORAL_TABLET | Freq: Every day | ORAL | 1 refills | Status: DC

## 2019-04-20 NOTE — Telephone Encounter (Signed)
furosemide (LASIX) 40 MG tablet   Refill request @ [facility] in [address], .

## 2019-04-30 ENCOUNTER — Encounter: Payer: Medicare Other | Admitting: Internal Medicine

## 2019-05-06 ENCOUNTER — Other Ambulatory Visit: Payer: Self-pay | Admitting: Internal Medicine

## 2019-05-06 DIAGNOSIS — I63219 Cerebral infarction due to unspecified occlusion or stenosis of unspecified vertebral arteries: Secondary | ICD-10-CM

## 2019-05-06 DIAGNOSIS — I6381 Other cerebral infarction due to occlusion or stenosis of small artery: Secondary | ICD-10-CM

## 2019-05-06 DIAGNOSIS — Z7901 Long term (current) use of anticoagulants: Secondary | ICD-10-CM

## 2019-05-14 ENCOUNTER — Encounter: Payer: Self-pay | Admitting: *Deleted

## 2019-05-14 NOTE — Progress Notes (Signed)

## 2019-05-14 NOTE — Progress Notes (Unsigned)

## 2019-05-21 ENCOUNTER — Telehealth: Payer: Self-pay | Admitting: Internal Medicine

## 2019-05-21 NOTE — Progress Notes (Signed)
Things That May Be Affecting Your Health:  Alcohol  Hearing loss  Pain   x Depression  Home Safety  Sexual Health   Diabetes x Lack of physical activity  Stress  x Difficulty with daily activities  Loneliness  Tiredness   Drug use x Medicines  Tobacco use   Falls  Motor Vehicle Safety  Weight   Food choices  Oral Health  Other    YOUR PERSONALIZED HEALTH PLAN : 1. Schedule your next subsequent Medicare Wellness visit in one year 2. Attend all of your regular appointments to address your medical issues 3. Complete the preventative screenings and services   Annual Wellness Visit   Medicare Covered Preventative Screenings and [place] Men and Women Who How Often Need? Date of Last Service Action  Abdominal Aortic Aneurysm Adults with AAA risk factors Once     Alcohol Misuse and Counseling All Adults Screening once a year if no alcohol misuse. Counseling up to 4 face to face sessions.     Bone Density Measurement  Adults at risk for osteoporosis Once every 2 yrs     Lipid Panel Z13.6 All adults without CV disease Once every 5 yrs     Colorectal Cancer   Stool sample or  Colonoscopy All adults 12 and older   Once every year  Every 10 years     Depression All Adults Once a year  Today   Diabetes Screening Blood glucose, post glucose load, or GTT Z13.1  All adults at risk  Pre-diabetics  Once per year  Twice per year     Diabetes  Self-Management Training All adults Diabetics 10 hrs first year; 2 hours subsequent years. Requires Copay     Glaucoma  Diabetics  Family history of glaucoma  African Americans 61 yrs +  Hispanic Americans 74 yrs + Annually - requires coppay     Hepatitis C Z72.89 or F19.20  High Risk for HCV  Born between 1945 and 1965  Annually  Once     HIV Z11.4 All adults based on risk  Annually btw ages 21 & 60 regardless of risk  Annually > 65 yrs if at increased risk     Lung Cancer Screening Asymptomatic adults aged  65-77 with 30 pack yr history and current smoker OR quit within the last 15 yrs Annually Must have counseling and shared decision making documentation before first screen     Medical Nutrition Therapy Adults with   Diabetes  Renal disease  Kidney transplant within past 3 yrs 3 hours first year; 2 hours subsequent years     Obesity and Counseling All adults Screening once a year Counseling if BMI 30 or higher  Today   Tobacco Use Counseling Adults who use tobacco  Up to 8 visits in one year     Vaccines Z23  Hepatitis B  Influenza   Pneumonia  Adults   Once  Once every flu season  Two different vaccines separated by one year     Next Annual Wellness Visit People with Medicare Every year  Today     Services & Screenings Women Who How Often Need  Date of Last Service Action  Mammogram  Z12.31 Women over 56 One baseline ages 72-39. Annually ager 40 yrs+     Pap tests All women Annually if high risk. Every 2 yrs for normal risk women     Screening for cervical cancer with   Pap (Z01.419 nl or Z01.411abnl) &  HPV Z11.51 Women aged 11 to 88 Once every 5 yrs     Screening pelvic and breast exams All women Annually if high risk. Every 2 yrs for normal risk women     Sexually Transmitted Diseases  Chlamydia  Gonorrhea  Syphilis All at risk adults Annually for non pregnant females at increased risk         [place] Men Who How Ofter Need  Date of Last Service Action  Prostate Cancer - DRE & PSA Men over 50 Annually.  DRE might require a copay.     Sexually Transmitted Diseases  Syphilis All at risk adults Annually for men at increased risk

## 2019-05-28 NOTE — Telephone Encounter (Signed)
Annual wellness

## 2019-05-29 ENCOUNTER — Other Ambulatory Visit: Payer: Self-pay | Admitting: *Deleted

## 2019-05-29 DIAGNOSIS — I10 Essential (primary) hypertension: Secondary | ICD-10-CM

## 2019-06-01 MED ORDER — AMLODIPINE BESYLATE 10 MG PO TABS: 10.0000 mg | ORAL_TABLET | Freq: Every day | ORAL | 5 refills | Status: DC

## 2019-06-05 ENCOUNTER — Other Ambulatory Visit: Payer: Self-pay | Admitting: Pharmacist

## 2019-06-05 DIAGNOSIS — I6381 Other cerebral infarction due to occlusion or stenosis of small artery: Secondary | ICD-10-CM

## 2019-06-05 DIAGNOSIS — Z7901 Long term (current) use of anticoagulants: Secondary | ICD-10-CM

## 2019-06-05 DIAGNOSIS — I63219 Cerebral infarction due to unspecified occlusion or stenosis of unspecified vertebral arteries: Secondary | ICD-10-CM

## 2019-06-16 ENCOUNTER — Other Ambulatory Visit: Payer: Self-pay | Admitting: Internal Medicine

## 2019-06-16 DIAGNOSIS — I10 Essential (primary) hypertension: Secondary | ICD-10-CM

## 2019-07-08 ENCOUNTER — Other Ambulatory Visit: Payer: Self-pay | Admitting: Internal Medicine

## 2019-07-08 DIAGNOSIS — Z7901 Long term (current) use of anticoagulants: Secondary | ICD-10-CM

## 2019-07-08 DIAGNOSIS — I63219 Cerebral infarction due to unspecified occlusion or stenosis of unspecified vertebral arteries: Secondary | ICD-10-CM

## 2019-07-08 DIAGNOSIS — I6381 Other cerebral infarction due to occlusion or stenosis of small artery: Secondary | ICD-10-CM

## 2019-08-12 ENCOUNTER — Other Ambulatory Visit: Payer: Self-pay | Admitting: Pharmacist

## 2019-08-12 DIAGNOSIS — I6381 Other cerebral infarction due to occlusion or stenosis of small artery: Secondary | ICD-10-CM

## 2019-08-12 DIAGNOSIS — Z7901 Long term (current) use of anticoagulants: Secondary | ICD-10-CM

## 2019-08-12 DIAGNOSIS — I63219 Cerebral infarction due to unspecified occlusion or stenosis of unspecified vertebral arteries: Secondary | ICD-10-CM

## 2019-09-09 NOTE — Progress Notes (Signed)
   CC: HTN follow up  HPI:  [name] is a 74 y.o. male with PMHx as documented below, presented for HTN follow up. Please refer to problem based charting for further details and assessment and plan of current problem and chronic medical conditions.   Past Medical History:  Diagnosis Date  . Atrial fibrillation ([place])   . CAD (coronary artery disease)   . Chronic anticoagulation, on coumadin 07/17/2013  . CKD (chronic kidney disease), stage II   . 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 Depression   . Diabetes mellitus type II    Insulin dependent  . GERD (gastroesophageal reflux disease)   . Gout   . Hyperlipidemia   . Hypertension   . Myocardial infarction [facility]) 2000's   "near heart attack" (07/14/2013)  . Paroxysmal atrial fibrillation ([facility])    on coumadin  . Permanent atrial fibrillation ([place]) 07/17/2013  . Shortness of breath    "can happen at any time" (07/14/2013)  . Sleep apnea 07/2010   "has mask at home; seldom uses it" (07/14/2013)  . Stomach ulcer 1950's   "as a teenager"  . Stroke [facility]) ~ 2007; ~ 2009   "memory not as good since" (07/14/2013)  . Tachy-brady syndrome ([place]) 07/17/2013  . Upper GI bleeding 07/01/2013   Review of Systems:   Review of Systems  Constitutional: Negative for chills and fever.  Respiratory: Negative for shortness of breath.   Cardiovascular: Negative for chest pain and leg swelling.  Gastrointestinal: Negative for abdominal pain, nausea and vomiting.  Neurological: Negative for dizziness and headaches.    Physical Exam:  There were no vitals filed for this visit.  SBP 172>192  Physical Exam  Constitutional: No acute distress.  Head: Normocephalic and atraumatic.  Cardiovascular:  RRR, nl S1S2, no murmur,  no LEE Respiratory: Effort normal and breath sounds normal. No respiratory distress. No wheezes.  GI: Soft. Bowel sounds are normal. No distension. There is no  tenderness.  Neurological: Is alert and oriented at baseline Skin: Not diaphoretic. No erythema.  Extremiries: No deformities Psychiatric: Blunt affect  Assessment & Plan:   See Encounters Tab for problem based charting.  Patient discussed with Dr. Rebeca Alert

## 2019-09-10 ENCOUNTER — Encounter: Payer: Self-pay | Admitting: Internal Medicine

## 2019-09-10 ENCOUNTER — Telehealth: Payer: Self-pay | Admitting: Internal Medicine

## 2019-09-10 ENCOUNTER — Ambulatory Visit (INDEPENDENT_AMBULATORY_CARE_PROVIDER_SITE_OTHER): Payer: Medicare Other | Admitting: Internal Medicine

## 2019-09-10 ENCOUNTER — Encounter: Payer: Self-pay | Admitting: *Deleted

## 2019-09-10 VITALS — BP 190/88 | HR 74 | Temp 98.2°F | Ht 66.0 in

## 2019-09-10 DIAGNOSIS — I6322 Cerebral infarction due to unspecified occlusion or stenosis of basilar arteries: Secondary | ICD-10-CM | POA: Diagnosis not present

## 2019-09-10 DIAGNOSIS — E118 Type 2 diabetes mellitus with unspecified complications: Secondary | ICD-10-CM | POA: Diagnosis not present

## 2019-09-10 DIAGNOSIS — E1165 Type 2 diabetes mellitus with hyperglycemia: Secondary | ICD-10-CM

## 2019-09-10 DIAGNOSIS — IMO0002 Reserved for concepts with insufficient information to code with codable children: Secondary | ICD-10-CM

## 2019-09-10 DIAGNOSIS — I63219 Cerebral infarction due to unspecified occlusion or stenosis of unspecified vertebral arteries: Secondary | ICD-10-CM

## 2019-09-10 DIAGNOSIS — I10 Essential (primary) hypertension: Secondary | ICD-10-CM | POA: Diagnosis not present

## 2019-09-10 DIAGNOSIS — N184 Chronic kidney disease, stage 4 (severe): Secondary | ICD-10-CM

## 2019-09-10 DIAGNOSIS — E113419 Type 2 diabetes mellitus with severe nonproliferative diabetic retinopathy with macular edema, unspecified eye: Secondary | ICD-10-CM

## 2019-09-10 DIAGNOSIS — I4821 Permanent atrial fibrillation: Secondary | ICD-10-CM

## 2019-09-10 DIAGNOSIS — Z7901 Long term (current) use of anticoagulants: Secondary | ICD-10-CM | POA: Diagnosis not present

## 2019-09-10 DIAGNOSIS — D649 Anemia, unspecified: Secondary | ICD-10-CM | POA: Diagnosis not present

## 2019-09-10 LAB — POCT GLYCOSYLATED HEMOGLOBIN (HGB A1C): Hemoglobin A1C: 6.3 % — AB (ref 4.0–5.6)

## 2019-09-10 LAB — GLUCOSE, CAPILLARY: Glucose-Capillary: 99 mg/dL (ref 70–99)

## 2019-09-10 LAB — POCT INR: INR: 1.9 — AB (ref 2.0–3.0)

## 2019-09-10 NOTE — Patient Instructions (Addendum)
Thank you for allowing [name] to provide your care today. Today we discussed your blood pressure and kidney disease. Your blood pressure is elevated and not controlled. Please avoid fast food, canned food that all contains salt and increase your blood pressure. We refer you to [place] network to provide assistance with taking medications at home.   Please make an appointment with your kidney doctor this month. I send you to the lab for some blood work. I will call if any are abnormal.    Please follow-up in 1week  Should you have any questions or concerns please call the internal medicine clinic at [phone].

## 2019-09-10 NOTE — Telephone Encounter (Signed)
Things That May Be Affecting Your Health:  Alcohol  Hearing loss  Pain    Depression  Home Safety  Sexual Health   Diabetes  Lack of physical activity  Stress   Difficulty with daily activities  Loneliness  Tiredness   Drug use  Medicines  Tobacco use   Falls  Motor Vehicle Safety  Weight   Food choices  Oral Health  Other    YOUR PERSONALIZED HEALTH PLAN : 1. Schedule your next subsequent Medicare Wellness visit in one year 2. Attend all of your regular appointments to address your medical issues 3. Complete the preventative screenings and services   Annual Wellness Visit   Medicare Covered Preventative Screenings and Services  Services & Screenings Men and Women Who How Often Need? Date of Last Service Action  Abdominal Aortic Aneurysm Adults with AAA risk factors Once     Alcohol Misuse and Counseling All Adults Screening once a year if no alcohol misuse. Counseling up to 4 face to face sessions.     Bone Density Measurement  Adults at risk for osteoporosis Once every 2 yrs     Lipid Panel Z13.6 All adults without CV disease Once every 5 yrs     Colorectal Cancer   Stool sample or  Colonoscopy All adults 50 and older   Once every year  Every 10 years     Depression All Adults Once a year  Today   Diabetes Screening Blood glucose, post glucose load, or GTT Z13.1  All adults at risk  Pre-diabetics  Once per year  Twice per year     Diabetes  Self-Management Training All adults Diabetics 10 hrs first year; 2 hours subsequent years. Requires Copay     Glaucoma  Diabetics  Family history of glaucoma  African Americans 50 yrs +  Hispanic Americans 65 yrs + Annually - requires coppay     Hepatitis C Z72.89 or F19.20  High Risk for HCV  Born between 1945 and 1965  Annually  Once     HIV Z11.4 All adults based on risk  Annually btw ages 15 & 65 regardless of risk  Annually > 65 yrs if at increased risk     Lung Cancer Screening Asymptomatic adults aged  55-77 with 30 pack yr history and current smoker OR quit within the last 15 yrs Annually Must have counseling and shared decision making documentation before first screen     Medical Nutrition Therapy Adults with   Diabetes  Renal disease  Kidney transplant within past 3 yrs 3 hours first year; 2 hours subsequent years     Obesity and Counseling All adults Screening once a year Counseling if BMI 30 or higher  Today   Tobacco Use Counseling Adults who use tobacco  Up to 8 visits in one year     Vaccines Z23  Hepatitis B  Influenza   Pneumonia  Adults   Once  Once every flu season  Two different vaccines separated by one year     Next Annual Wellness Visit People with Medicare Every year  Today     Services & Screenings Women Who How Often Need  Date of Last Service Action  Mammogram  Z12.31 Women over 40 One baseline ages 35-39. Annually ager 40 yrs+     Pap tests All women Annually if high risk. Every 2 yrs for normal risk women     Screening for cervical cancer with   Pap (Z01.419 nl or Z01.411abnl) &    HPV Z11.51 Women aged 11 to 88 Once every 5 yrs     Screening pelvic and breast exams All women Annually if high risk. Every 2 yrs for normal risk women     Sexually Transmitted Diseases  Chlamydia  Gonorrhea  Syphilis All at risk adults Annually for non pregnant females at increased risk         [place] Men Who How Ofter Need  Date of Last Service Action  Prostate Cancer - DRE & PSA Men over 50 Annually.  DRE might require a copay.     Sexually Transmitted Diseases  Syphilis All at risk adults Annually for men at increased risk

## 2019-09-10 NOTE — Progress Notes (Signed)

## 2019-09-11 LAB — CBC WITH DIFFERENTIAL/PLATELET
Basophils Absolute: 0 10*3/uL (ref 0.0–0.2)
Basos: 1 %
EOS (ABSOLUTE): 0.8 10*3/uL — ABNORMAL HIGH (ref 0.0–0.4)
Eos: 18 %
Hematocrit: 39.1 % (ref 37.5–51.0)
Hemoglobin: 12.4 g/dL — ABNORMAL LOW (ref 13.0–17.7)
Immature Grans (Abs): 0 10*3/uL (ref 0.0–0.1)
Immature Granulocytes: 0 %
Lymphocytes Absolute: 0.9 10*3/uL (ref 0.7–3.1)
Lymphs: 20 %
MCH: 26.2 pg — ABNORMAL LOW (ref 26.6–33.0)
MCHC: 31.7 g/dL (ref 31.5–35.7)
MCV: 83 fL (ref 79–97)
Monocytes Absolute: 0.4 10*3/uL (ref 0.1–0.9)
Monocytes: 10 %
Neutrophils Absolute: 2.2 10*3/uL (ref 1.4–7.0)
Neutrophils: 51 %
Platelets: 203 10*3/uL (ref 150–450)
RBC: 4.74 x10E6/uL (ref 4.14–5.80)
RDW: 13.7 % (ref 11.6–15.4)
WBC: 4.3 10*3/uL (ref 3.4–10.8)

## 2019-09-11 LAB — BMP8+ANION GAP
Anion Gap: 15 mmol/L (ref 10.0–18.0)
BUN/Creatinine Ratio: 16 (ref 10–24)
BUN: 38 mg/dL — ABNORMAL HIGH (ref 8–27)
CO2: 23 mmol/L (ref 20–29)
Calcium: 9 mg/dL (ref 8.6–10.2)
Chloride: 105 mmol/L (ref 96–106)
Creatinine, Ser: 2.39 mg/dL — ABNORMAL HIGH (ref 0.76–1.27)
GFR calc Af Amer: 30 mL/min/{1.73_m2} — ABNORMAL LOW
GFR calc non Af Amer: 26 mL/min/{1.73_m2} — ABNORMAL LOW
Glucose: 101 mg/dL — ABNORMAL HIGH (ref 65–99)
Potassium: 3.8 mmol/L (ref 3.5–5.2)
Sodium: 143 mmol/L (ref 134–144)

## 2019-09-12 NOTE — Assessment & Plan Note (Signed)
Checking INR. >1.9 Contacted our pharmacist for warfarin adjustment

## 2019-09-12 NOTE — Assessment & Plan Note (Signed)
Diet controlled DM. Checking A1c today

## 2019-09-12 NOTE — Assessment & Plan Note (Signed)
Checking CBC today 

## 2019-09-12 NOTE — Assessment & Plan Note (Addendum)
Blood pressure is uncontrolled mainly due to non adherance to medication and also diet. He is supposed to take Amlodipine 10 mg QD, Coreg 3.125 mg BI, lasix 40 mg QD. He has taken Coreg QD instead of BID despite prior discussion. I do not think adding a new medications will be the best option. SBP today is 170-190. He has not taken his meds yet. He is asymptomatic now. Giving home Coreg and Amlodipine. We reviewed all his meds and instructions of his medications  He will need some home health (aid) to assist with adherence. I send referral to [facility] care management. I will also consider switching Coreg BID to Metoprolol QD or add other QD agents next visit if still have noncompliance and uncontrolled BP.  -Continue current meds -THN refferal  -BMP -F/u in clinic in 1 week

## 2019-09-14 ENCOUNTER — Telehealth: Payer: Self-pay | Admitting: Pharmacist

## 2019-09-14 ENCOUNTER — Other Ambulatory Visit: Payer: Self-pay | Admitting: *Deleted

## 2019-09-14 NOTE — Telephone Encounter (Signed)
Called patient's home and spoke with wife. Advised to INCREASE dose to 1 and 1/2 x 4mg  on M/W/F; all other days, only one (1) tablet. Repeat INR at clinic on [place] at [facility].

## 2019-09-14 NOTE — Progress Notes (Signed)
Internal Medicine Clinic Attending  Case discussed with Dr. Myrtie Hawk at the time of the visit.  We reviewed the resident's history and exam and pertinent patient test results.  I agree with the assessment, diagnosis, and plan of care documented in the resident's note.  I will refer to CCM on 09/28/2019.

## 2019-09-14 NOTE — Patient Outreach (Signed)
[facility]) Care Management  09/14/2019  Elba Dendinger 21-Feb-1946 [id number]   Referral Date: 2/12 Referral Source: MD office Referral Reason: Uncontrolled chronic medical diseases, compliance has been an issue, needs one on one support.   Insurance: Next [employer] attempt #1, successful.  Social: Lives with wife and son, wife report member is independent with ADL's, she help him manage his medical conditions and medications.   Conditions: Per chart, has history of HTN, CAD, A-fib,CHF, CVA, Diabetes, CKD, HLD, gout, and chronic anemia.   Medications: Reviewed with wife. She report MD had concern regarding management as he was taking inappropriately until most recent visit.  State there was a misunderstanding about how many times a day he was to take his Coreg, has started taking correctly now.  Denies needing more education/management of meds, denies any concern for being able to afford.  Appointments:Last visit with PCP was 2/11, scheduled for follow up on 2/18 for blood pressure check.  Wife report she may need to change this appointment as she already have a couple appointments for herself the same day.  State one of her sons provide transportation to appointments.  Currently member does not monitor his blood pressure at home, but wife report they do have a machine.  Advised to monitor blood pressure daily and provide readings to MD office to determine recommended timeline for return if unable to keep appointment this week.    Wife denies any urgent or extensive needs for managing conditions, but she is open ongoing education.  Plan: RN CM will place referral to health coach.  Valente David, [place], MSN [place] [phone]

## 2019-09-15 ENCOUNTER — Other Ambulatory Visit: Payer: Self-pay | Admitting: *Deleted

## 2019-09-15 NOTE — Telephone Encounter (Signed)
Things That May Be Affecting Your Health:  Alcohol  Hearing loss  Pain   x Depression  Home Safety  Sexual Health   Diabetes  Lack of physical activity  Stress   Difficulty with daily activities  Loneliness  Tiredness   Drug use  Medicines  Tobacco use   Falls  Motor Vehicle Safety  Weight   Food choices  Oral Health x Other    YOUR PERSONALIZED HEALTH PLAN : 1. Schedule your next subsequent Medicare Wellness visit in one year 2. Attend all of your regular appointments to address your medical issues 3. Complete the preventative screenings and services   Annual Wellness Visit   Medicare Covered Preventative Screenings and [place] Men and Women Who How Often Need? Date of Last Service Action  Abdominal Aortic Aneurysm Adults with AAA risk factors Once     Alcohol Misuse and Counseling All Adults Screening once a year if no alcohol misuse. Counseling up to 4 face to face sessions.     Bone Density Measurement  Adults at risk for osteoporosis Once every 2 yrs     Lipid Panel Z13.6 All adults without CV disease Once every 5 yrs     Colorectal Cancer   Stool sample or  Colonoscopy All adults 52 and older   Once every year  Every 10 years x    Depression All Adults Once a year x Today   Diabetes Screening Blood glucose, post glucose load, or GTT Z13.1  All adults at risk  Pre-diabetics  Once per year  Twice per year     Diabetes  Self-Management Training All adults Diabetics 10 hrs first year; 2 hours subsequent years. Requires Copay     Glaucoma  Diabetics  Family history of glaucoma  African Americans 46 yrs +  Hispanic Americans 51 yrs + Annually - requires coppay     Hepatitis C Z72.89 or F19.20  High Risk for HCV  Born between 1945 and 1965  Annually  Once x    HIV Z11.4 All adults based on risk  Annually btw ages 60 & 41 regardless of risk  Annually > 65 yrs if at increased risk     Lung Cancer Screening Asymptomatic adults  aged 76-77 with 30 pack yr history and current smoker OR quit within the last 15 yrs Annually Must have counseling and shared decision making documentation before first screen     Medical Nutrition Therapy Adults with   Diabetes  Renal disease  Kidney transplant within past 3 yrs 3 hours first year; 2 hours subsequent years     Obesity and Counseling All adults Screening once a year Counseling if BMI 30 or higher  Today   Tobacco Use Counseling Adults who use tobacco  Up to 8 visits in one year     Vaccines Z23  Hepatitis B  Influenza   Pneumonia  Adults   Once  Once every flu season  Two different vaccines separated by one year     Next Annual Wellness Visit People with Medicare Every year  Today     Services & Screenings Women Who How Often Need  Date of Last Service Action  Mammogram  Z12.31 Women over 74 One baseline ages 37-39. Annually ager 40 yrs+     Pap tests All women Annually if high risk. Every 2 yrs for normal risk women     Screening for cervical cancer with   Pap (Z01.419 nl or Z01.411abnl) &  HPV Z11.51 Women aged 11 to 88 Once every 5 yrs     Screening pelvic and breast exams All women Annually if high risk. Every 2 yrs for normal risk women     Sexually Transmitted Diseases  Chlamydia  Gonorrhea  Syphilis All at risk adults Annually for non pregnant females at increased risk         [place] Men Who How Ofter Need  Date of Last Service Action  Prostate Cancer - DRE & PSA Men over 50 Annually.  DRE might require a copay.     Sexually Transmitted Diseases  Syphilis All at risk adults Annually for men at increased risk

## 2019-09-17 ENCOUNTER — Ambulatory Visit: Payer: Medicare Other

## 2019-09-18 ENCOUNTER — Other Ambulatory Visit: Payer: Self-pay | Admitting: Internal Medicine

## 2019-09-18 DIAGNOSIS — I10 Essential (primary) hypertension: Secondary | ICD-10-CM

## 2019-09-24 ENCOUNTER — Other Ambulatory Visit: Payer: Self-pay | Admitting: *Deleted

## 2019-09-24 NOTE — Patient Outreach (Signed)
[facility]) Care Management  09/24/2019  Cleatis Fandrich 1946-05-26 [id number]   RN Health Coach Initial Assessment  Referral Date:  09/15/2019 Referral Source:  MD Office Screening Reason for Referral:  Disease Management Education Insurance:  Medicare   Outreach Attempt:  Outreach attempt #1 to patient for introduction and initial telephone assessment.  Wife answered and verified HIPAA, patient with history of stroke with residual memory and speech problems.  RN Health Coach introduced self and role.  Wife a little reluctant about Disease Management program and unsure why patient was referred to program.  Confirmed patient with history of heart failure, hypertension, and diabetes.  Discussed and reviewed [facility] services and Disease Management education and outreach.  Wife verbally agrees to Disease Management outreaches.  Unable to complete initial telephone assessment today and requesting a call back.  Plan: RN Health Coach will send [facility] Welcome Packet. RN Health Coach will make another outreach attempt to complete initial telephone assessment within the month of March per wife's request.  Hubert Azure RN [place] [phone] [email or web address]

## 2019-10-01 ENCOUNTER — Ambulatory Visit: Payer: Medicare Other | Admitting: Internal Medicine

## 2019-10-05 ENCOUNTER — Ambulatory Visit: Payer: Medicare Other

## 2019-10-07 ENCOUNTER — Other Ambulatory Visit: Payer: Self-pay

## 2019-10-07 ENCOUNTER — Encounter: Payer: Self-pay | Admitting: Internal Medicine

## 2019-10-07 ENCOUNTER — Ambulatory Visit (INDEPENDENT_AMBULATORY_CARE_PROVIDER_SITE_OTHER): Payer: Medicare Other | Admitting: Internal Medicine

## 2019-10-07 DIAGNOSIS — Z87891 Personal history of nicotine dependence: Secondary | ICD-10-CM | POA: Diagnosis not present

## 2019-10-07 DIAGNOSIS — Z0000 Encounter for general adult medical examination without abnormal findings: Secondary | ICD-10-CM | POA: Diagnosis not present

## 2019-10-07 NOTE — Progress Notes (Signed)
This AWV is being conducted by [facility] only. The patient was located at home and I was located in [facility]. The patient's identity was confirmed using their DOB and current address. The patient or his/her legal guardian has consented to being evaluated through a telephone encounter and understands the associated risks (an examination cannot be done and the patient may need to come in for an appointment) / benefits (allows the patient to remain at home, decreasing exposure to coronavirus). I personally spent 47 minutes conducting the AWV.  Subjective:   Patrick [name] is a 74 y.o. male who presents for a Medicare Annual Wellness Visit.  The following items have been reviewed and updated today in the appropriate area in the EMR.   Health Risk Assessment  Height, weight, BMI, and BP Visual acuity if needed Depression screen Fall risk / safety level Advance directive discussion (Patient has forms but has not yet completed) Medical and family history were reviewed and updated Updating list of other providers & suppliers Medication reconciliation, including over the counter medicines Cognitive screen Written screening schedule Risk Factor list Personalized health advice, risky behaviors, and treatment advice  Social History   Social History Narrative   Former smoker - stopped 1978   Alcohol stopped 1970   Full Code      Current Social History 10/07/2019        Patient lives with spouse [name]) and 1 son in a two level home.       Patient's method of transportation is via family member (Son or daughter-in-law).      The highest level of education was 5 th grade.      The patient currently retired Engineer, manufacturing systems.      Identified important Relationships are Wife, sons, and grandchildren       Pets : None       Interests / Fun: Watch TV       Current Stressors: No bedroom downstairs. Has to climb 12 steps to bedroom.       Religious / Personal Beliefs: Christian       L.  Arriah Wadle, BSN, RN-BC             Objective:    Vitals: There were no vitals taken for this visit. Vitals are unable to obtained due to KCMKL-49 public health emergency  Activities of Daily Living In your present state of health, do you have any difficulty performing the following activities: 10/07/2019  Hearing? Y  Vision? Y  Difficulty concentrating or making decisions? N  Walking or climbing stairs? Y  Dressing or bathing? N  Doing errands, shopping? N  Some recent data might be hidden    Goals Goals    . Blood Pressure < 140/90    . HEMOGLOBIN A1C < 7.0    . LDL CALC < 100       Fall Risk Fall Risk  10/07/2019 10/02/2018 06/30/2018 06/16/2018 06/04/2018  Falls in the past year? 0 0 1 0 0  Number falls in past yr: - - - - -  Injury with Fall? - - 0 - -  Risk Factor Category  - - - - -  Risk for fall due to : Impaired balance/gait;Impaired mobility Impaired balance/gait - - -  Risk for fall due to: Comment - - - - -  Follow up Education provided;Falls prevention discussed - - - -   CDC Handout on Fall Prevention and Handout on Home Exercise Program, Access codes [phone] and  [id number] given/mailed to patient with exercise band.   Depression Screen PHQ 2/9 Scores 10/07/2019 06/16/2018 06/04/2018 08/05/2017  PHQ - 2 Score 0 - 3 0  PHQ- 9 Score 0 - 9 -  Exception Documentation - Other- indicate reason in comment box - -  Not completed - States no change since last screening - -     Cognitive Testing Six-Item Cognitive Screener   "I would like to ask you some questions that ask you to use your memory. I am going to name three objects. Please wait until I say all three words, then repeat them. Remember what they are  because I am going to ask you to name them again in a few minutes. Please repeat these words for me: APPLE--TABLE--PENNY." (Interviewer may repeat names 3 times if necessary but repetition not scored.)  Did patient correctly repeat all three words? Yes - may  proceed with screen  What year is this? Correct What month is this? Correct What day of the week is this? Correct  What were the three objects I asked you to remember? . Apple Correct . Table Unable to state . Kieth Brightly Unable to state  Score one point for each incorrect answer.  A score of 2 or more points warrants additional investigation.  Patient's score 2    Assessment and Plan:     Patient is not interested in referral to GI for colonoscopy at this time He will begin seated and standing exercises with red exercise band.  During the course of the visit the patient was educated and counseled about appropriate screening and preventive services as documented in the assessment and plan.  The printed AVS was given to the patient and included an updated screening schedule, a list of risk factors, and personalized health advice.        Velora Heckler, RN  10/07/2019

## 2019-10-07 NOTE — Patient Instructions (Addendum)
Annual Wellness Visit   Medicare Covered Preventative Screenings and Services  Services & Screenings Men and Women Who How Often Need? Date of Last Service Action  Abdominal Aortic Aneurysm Adults with AAA risk factors Once     Alcohol Misuse and Counseling All Adults Screening once a year if no alcohol misuse. Counseling up to 4 face to face sessions.     Bone Density Measurement  Adults at risk for osteoporosis Once every 2 yrs     Lipid Panel Z13.6 All adults without CV disease Once every 5 yrs     Colorectal Cancer   Stool sample or  Colonoscopy All adults 17 and older   Once every year  Every 10 years  X  Please consider referral to GI for colonoscopy  Depression All Adults Once a year  Today   Diabetes Screening Blood glucose, post glucose load, or GTT Z13.1  All adults at risk  Pre-diabetics  Once per year  Twice per year     Diabetes  Self-Management Training All adults Diabetics 10 hrs first year; 2 hours subsequent years. Requires Copay     Glaucoma  Diabetics  Family history of glaucoma  African Americans 67 yrs +  Hispanic Americans 11 yrs + Annually - requires coppay     Hepatitis C Z72.89 or F19.20  High Risk for HCV  Born between 1945 and 1965  Annually  Once  X  At next visit  HIV Z11.4 All adults based on risk  Annually btw ages 5 & 94 regardless of risk  Annually > 65 yrs if at increased risk     Lung Cancer Screening Asymptomatic adults aged 75-77 with 30 pack yr history and current smoker OR quit within the last 15 yrs Annually Must have counseling and shared decision making documentation before first screen     Medical Nutrition Therapy Adults with   Diabetes  Renal disease  Kidney transplant within past 3 yrs 3 hours first year; 2 hours subsequent years     Obesity and Counseling All adults Screening once a year Counseling if BMI 30 or higher  Today   Tobacco Use Counseling Adults who use tobacco  Up to 8 visits in one year       Vaccines Z23  Hepatitis B  Influenza   Pneumonia  Adults   Once  Once every flu season  Two different vaccines separated by one year     Next Annual Wellness Visit People with Medicare Every year  Today     Services & Screenings Women Who How Often Need  Date of Last Service Action  Mammogram  Z12.31 Women over 61 One baseline ages 87-39. Annually ager 40 yrs+     Pap tests All women Annually if high risk. Every 2 yrs for normal risk women     Screening for cervical cancer with   Pap (Z01.419 nl or Z01.411abnl) &  HPV Z11.51 Women aged 61 to 19 Once every 5 yrs     Screening pelvic and breast exams All women Annually if high risk. Every 2 yrs for normal risk women     Sexually Transmitted Diseases  Chlamydia  Gonorrhea  Syphilis All at risk adults Annually for non pregnant females at increased risk         [place] Men Who How Ofter Need  Date of Last Service Action  Prostate Cancer - DRE & PSA Men over 50 Annually.  DRE might require a copay.  Sexually Transmitted Diseases  Syphilis All at risk adults Annually for men at increased risk         Things That May Be Affecting Your Health:  Alcohol  Hearing loss  Pain    Depression  Home Safety  Sexual Health   Diabetes  Lack of physical activity  Stress   Difficulty with daily activities  Loneliness  Tiredness   Drug use  Medicines  Tobacco use   Falls  Motor Vehicle Safety  Weight   Food choices  Oral Health  Other    YOUR PERSONALIZED HEALTH PLAN : 1. Schedule your next subsequent Medicare Wellness visit in one year 2. Attend all of your regular appointments to address your medical issues 3. Complete the preventative screenings and services 4. Consider referral to GI for colonoscopy 5. Begin seated and standing exercises with exercise band.     Fall Prevention in the Home, Adult Falls can cause injuries. They can happen to people of all ages. There are many things you can do to  make your home safe and to help prevent falls. Ask for help when making these changes, if needed. What actions can I take to prevent falls? General Instructions  Use good lighting in all rooms. Replace any light bulbs that burn out.  Turn on the lights when you go into a dark area. Use night-lights.  Keep items that you use often in easy-to-reach places. Lower the shelves around your home if necessary.  Set up your furniture so you have a clear path. Avoid moving your furniture around.  Do not have throw rugs and other things on the floor that can make you trip.  Avoid walking on wet floors.  If any of your floors are uneven, fix them.  Add color or contrast paint or tape to clearly mark and help you see: ? Any grab bars or handrails. ? First and last steps of stairways. ? Where the edge of each step is.  If you use a stepladder: ? Make sure that it is fully opened. Do not climb a closed stepladder. ? Make sure that both sides of the stepladder are locked into place. ? Ask someone to hold the stepladder for you while you use it.  If there are any pets around you, be aware of where they are. What can I do in the bathroom?      Keep the floor dry. Clean up any water that spills onto the floor as soon as it happens.  Remove soap buildup in the tub or shower regularly.  Use non-skid mats or decals on the floor of the tub or shower.  Attach bath mats securely with double-sided, non-slip rug tape.  If you need to sit down in the shower, use a plastic, non-slip stool.  Install grab bars by the toilet and in the tub and shower. Do not use towel bars as grab bars. What can I do in the bedroom?  Make sure that you have a light by your bed that is easy to reach.  Do not use any sheets or blankets that are too big for your bed. They should not hang down onto the floor.  Have a firm chair that has side arms. You can use this for support while you get dressed. What can I do in  the kitchen?  Clean up any spills right away.  If you need to reach something above you, use a strong step stool that has a grab bar.  Keep electrical  cords out of the way.  Do not use floor polish or wax that makes floors slippery. If you must use wax, use non-skid floor wax. What can I do with my stairs?  Do not leave any items on the stairs.  Make sure that you have a light switch at the top of the stairs and the bottom of the stairs. If you do not have them, ask someone to add them for you.  Make sure that there are handrails on both sides of the stairs, and use them. Fix handrails that are broken or loose. Make sure that handrails are as long as the stairways.  Install non-slip stair treads on all stairs in your home.  Avoid having throw rugs at the top or bottom of the stairs. If you do have throw rugs, attach them to the floor with carpet tape.  Choose a carpet that does not hide the edge of the steps on the stairway.  Check any carpeting to make sure that it is firmly attached to the stairs. Fix any carpet that is loose or worn. What can I do on the outside of my home?  Use bright outdoor lighting.  Regularly fix the edges of walkways and driveways and fix any cracks.  Remove anything that might make you trip as you walk through a door, such as a raised step or threshold.  Trim any bushes or trees on the path to your home.  Regularly check to see if handrails are loose or broken. Make sure that both sides of any steps have handrails.  Install guardrails along the edges of any raised decks and porches.  Clear walking paths of anything that might make someone trip, such as tools or rocks.  Have any leaves, snow, or ice cleared regularly.  Use sand or salt on walking paths during winter.  Clean up any spills in your garage right away. This includes grease or oil spills. What other actions can I take?  Wear shoes that: ? Have a low heel. Do not wear high  heels. ? Have rubber bottoms. ? Are comfortable and fit you well. ? Are closed at the toe. Do not wear open-toe sandals.  Use tools that help you move around (mobility aids) if they are needed. These include: ? Canes. ? Walkers. ? Scooters. ? Crutches.  Review your medicines with your doctor. Some medicines can make you feel dizzy. This can increase your chance of falling. Ask your doctor what other things you can do to help prevent falls. Where to find more information  Centers for Disease Control and Prevention, STEADI: https://garcia.biz/  [employer] on Aging: BrainJudge.co.uk Contact a doctor if:  You are afraid of falling at home.  You feel weak, drowsy, or dizzy at home.  You fall at home. Summary  There are many simple things that you can do to make your home safe and to help prevent falls.  Ways to make your home safe include removing tripping hazards and installing grab bars in the bathroom.  Ask for help when making these changes in your home. This information is not intended to replace advice given to you by your health care provider. Make sure you discuss any questions you have with your health care provider. Document Revised: 11/06/2018 Document Reviewed: 02/28/2017 Elsevier Patient Education  2020 [place] Maintenance, Male Adopting a healthy lifestyle and getting preventive care are important in promoting health and wellness. Ask your health care provider about:  The right schedule  for you to have regular tests and exams.  Things you can do on your own to prevent diseases and keep yourself healthy. What should I know about diet, weight, and exercise? Eat a healthy diet   Eat a diet that includes plenty of vegetables, fruits, low-fat dairy products, and lean protein.  Do not eat a lot of foods that are high in solid fats, added sugars, or sodium. Maintain a healthy weight Body mass index (BMI) is a measurement that can be  used to identify possible weight problems. It estimates body fat based on height and weight. Your health care provider can help determine your BMI and help you achieve or maintain a healthy weight. Get regular exercise Get regular exercise. This is one of the most important things you can do for your health. Most adults should:  Exercise for at least 150 minutes each week. The exercise should increase your heart rate and make you sweat (moderate-intensity exercise).  Do strengthening exercises at least twice a week. This is in addition to the moderate-intensity exercise.  Spend less time sitting. Even light physical activity can be beneficial. Watch cholesterol and blood lipids Have your blood tested for lipids and cholesterol at 72 years of age, then have this test every 5 years. You may need to have your cholesterol levels checked more often if:  Your lipid or cholesterol levels are high.  You are older than 72 years of age.  You are at high risk for heart disease. What should I know about cancer screening? Many types of cancers can be detected early and may often be prevented. Depending on your health history and family history, you may need to have cancer screening at various ages. This may include screening for:  Colorectal cancer.  Prostate cancer.  Skin cancer.  Lung cancer. What should I know about heart disease, diabetes, and high blood pressure? Blood pressure and heart disease  High blood pressure causes heart disease and increases the risk of stroke. This is more likely to develop in people who have high blood pressure readings, are of African descent, or are overweight.  Talk with your health care provider about your target blood pressure readings.  Have your blood pressure checked: ? Every 3-5 years if you are 86-68 years of age. ? Every year if you are 47 years old or older.  If you are between the ages of 36 and 23 and are a current or former smoker, ask your  health care provider if you should have a one-time screening for abdominal aortic aneurysm (AAA). Diabetes Have regular diabetes screenings. This checks your fasting blood sugar level. Have the screening done:  Once every three years after age 30 if you are at a normal weight and have a low risk for diabetes.  More often and at a younger age if you are overweight or have a high risk for diabetes. What should I know about preventing infection? Hepatitis B If you have a higher risk for hepatitis B, you should be screened for this virus. Talk with your health care provider to find out if you are at risk for hepatitis B infection. Hepatitis C Blood testing is recommended for:  Everyone born from [birth year] through 1965.  Anyone with known risk factors for hepatitis C. Sexually transmitted infections (STIs)  You should be screened each year for STIs, including gonorrhea and chlamydia, if: ? You are sexually active and are younger than 72 years of age. ? You are older than 72 years  of age and your health care provider tells you that you are at risk for this type of infection. ? Your sexual activity has changed since you were last screened, and you are at increased risk for chlamydia or gonorrhea. Ask your health care provider if you are at risk.  Ask your health care provider about whether you are at high risk for HIV. Your health care provider may recommend a prescription medicine to help prevent HIV infection. If you choose to take medicine to prevent HIV, you should first get tested for HIV. You should then be tested every 3 months for as long as you are taking the medicine. Follow these instructions at home: Lifestyle  Do not use any products that contain nicotine or tobacco, such as cigarettes, e-cigarettes, and chewing tobacco. If you need help quitting, ask your health care provider.  Do not use street drugs.  Do not share needles.  Ask your health care provider for help if you need  support or information about quitting drugs. Alcohol use  Do not drink alcohol if your health care provider tells you not to drink.  If you drink alcohol: ? Limit how much you have to 0-2 drinks a day. ? Be aware of how much alcohol is in your drink. In the [place] one drink equals one 12 oz bottle of beer (355 mL), one 5 oz glass of wine (148 mL), or one 1 oz glass of hard liquor (44 mL). General instructions  Schedule regular health, dental, and eye exams.  Stay current with your vaccines.  Tell your health care provider if: ? You often feel depressed. ? You have ever been abused or do not feel safe at home. Summary  Adopting a healthy lifestyle and getting preventive care are important in promoting health and wellness.  Follow your health care provider's instructions about healthy diet, exercising, and getting tested or screened for diseases.  Follow your health care provider's instructions on monitoring your cholesterol and blood pressure. This information is not intended to replace advice given to you by your health care provider. Make sure you discuss any questions you have with your health care provider. Document Revised: 07/09/2018 Document Reviewed: 07/09/2018 Elsevier Patient Education  2020 [employer].

## 2019-10-07 NOTE — Progress Notes (Signed)
I discussed the AWV findings with the RN who conducted the visit. I was present in the office suite and immediately available to provide assistance and direction throughout the time the service was provided.   

## 2019-10-08 ENCOUNTER — Other Ambulatory Visit: Payer: Self-pay | Admitting: *Deleted

## 2019-10-08 NOTE — Progress Notes (Signed)
Internal Medicine Clinic Attending  I have reviewed the visit with Dr. Trilby Drummer.  We reviewed the AWV findings.  I agree with the assessment, diagnosis, and plan of care documented in the AWV note.    Lenice Pressman, MD, PhD

## 2019-10-08 NOTE — Patient Outreach (Signed)
[facility]) Care Management  10/08/2019  Patrick [name] 1945-10-01 [id number]   RN Health Coach Initial Assessment  Referral Date:  09/15/2019 Referral Source:  MD Office Screening Reason for Referral:  Disease Management Education Insurance:  Medicare   Outreach Attempt: Outreach attempt #2 to patient for initial telephone assessment.  Wife answered and verified HIPAA (patient with history of stroke and residual speech and memory deficients).  RN Health Coach reintroduced self and role and [facility] Ed services.  Wife states she has [facility] Welcome packet and has reviewed some.  Agrees to Disease Management outreaches but states she has been inundated with calls and her medical appointments recently and just completed his Annual Wellness Visit with his primary care provider over the telephone yesterday that took over 30 minutes; requesting a call back in a "few months" to complete initial telephone assessment.    Plan:  RN Health Coach will make another outreach attempt to complete initial telephone assessment within the month of May per wife's request.  Hubert Azure RN [place] [phone] [email or web address]

## 2019-10-12 ENCOUNTER — Ambulatory Visit: Payer: Self-pay | Admitting: *Deleted

## 2019-10-12 ENCOUNTER — Telehealth: Payer: Medicare Other | Admitting: *Deleted

## 2019-10-12 DIAGNOSIS — E113419 Type 2 diabetes mellitus with severe nonproliferative diabetic retinopathy with macular edema, unspecified eye: Secondary | ICD-10-CM

## 2019-10-12 DIAGNOSIS — I10 Essential (primary) hypertension: Secondary | ICD-10-CM

## 2019-10-12 DIAGNOSIS — I5022 Chronic systolic (congestive) heart failure: Secondary | ICD-10-CM

## 2019-10-12 DIAGNOSIS — I63413 Cerebral infarction due to embolism of bilateral middle cerebral arteries: Secondary | ICD-10-CM

## 2019-10-12 DIAGNOSIS — I4821 Permanent atrial fibrillation: Secondary | ICD-10-CM

## 2019-10-12 NOTE — Addendum Note (Signed)
Addended by: Barrington Ellison on: 10/12/2019 04:13 PM   Modules accepted: Orders

## 2019-10-12 NOTE — Chronic Care Management (AMB) (Addendum)
Chronic Care Management   Initial Visit Note  10/12/2019 Name: Patrick [name] MRN: [id number] DOB: Apr 24, 1946  Referred by: [name], MD Reason for referral : Chronic Care Management (Type 2 DM, HTN)   Patrick [name] is a 74 y.o. year old male who is a primary care patient of [name], Camera operator, MD. The CCM team was consulted for assistance with chronic disease management and care coordination needs related to Atrial Fibrillation, CHF, CAD, HTN and DMII  Review of patient status, including review of consultants reports, relevant laboratory and other test results, and collaboration with appropriate care team members and the patient's provider was performed as part of comprehensive patient evaluation and provision of chronic care management services.    SDOH (Social Determinants of Health) assessments performed: Yes See Care Plan activities for detailed interventions related to SDOH)  SDOH Interventions     Most Recent Value  SDOH Interventions  SDOH Interventions for the Following Domains  Transportation  Transportation Interventions  Other (Comment) [referred to care guide for transportaion resources]       Medications: Outpatient Encounter Medications as of 10/12/2019  Medication Sig  . amLODipine (NORVASC) 10 MG tablet Take 1 tablet (10 mg total) by mouth daily.  . Blood Glucose Monitoring Suppl (ONETOUCH VERIO) w/Device KIT 1 strip 4 (four) times daily by Does not apply route. Pharmacist, if not covered, provide [employer] Relion Meter, please counsel (Patient not taking: Reported on 10/07/2019)  . carvedilol (COREG) 3.125 MG tablet TAKE 1 TABLET BY MOUTH TWICE DAILY WITH A MEAL  . diclofenac sodium (VOLTAREN) 1 % GEL Apply 4 g topically 4 (four) times daily. (Patient not taking: Reported on 10/07/2019)  . docusate sodium (COLACE) 100 MG capsule Take 100 mg by mouth 2 (two) times daily.  . furosemide (LASIX) 40 MG tablet Take 1 tablet (40 mg total) by mouth daily.  Marland Kitchen glucose  blood (ONE TOUCH ULTRA TEST) test strip Use four times daily before meals and at bedtime. Diagnosis code [phone] (Patient not taking: Reported on 10/07/2019)  . ONETOUCH DELICA LANCETS 54O MISC 1 strip 4 (four) times daily -  before meals and at bedtime by Does not apply route. (Patient not taking: Reported on 10/07/2019)  . warfarin (COUMADIN) 4 MG tablet TAKE 1 TABLET BY MOUTH ONCE DAILY EXCEPT  TAKE  1  AND  1/2  TABLETS  EVERY  TUESDAY  AND  THURSDAY   No facility-administered encounter medications on file as of 10/12/2019.     Objective:  Lab Results  Component Value Date   HGBA1C 6.3 (A) 09/10/2019   HGBA1C 7.4 (A) 06/04/2018   HGBA1C 9.4 06/10/2017   Lab Results  Component Value Date   MICROALBUR 94.9 (H) 10/18/2014   LDLCALC 65 11/20/2015 Need LDL update   CREATININE 2.39 (H) 09/10/2019   BP Readings from Last 3 Encounters:  09/10/19 (!) 190/88  10/02/18 (!) 170/84  08/12/18 (!) 163/89    Goals Addressed            This Visit's Progress     Patient Stated   . Wife states " We could use some help with his blood pressure. He hasn't had his eyes checked in 3-4 years." (pt-stated)       CARE PLAN ENTRY (see longitudinal plan of care for additional care plan information)  Current Barriers:  . Chronic Disease Management support, education, and care coordination needs related to CHF, CAD, HTN, and DMII- patient's wife states they speak with a [place]  Management RN Health Coach on a regular basis and she and patient are willing to work with the clinic chronic care management staff for additional support with chronic disease management , especially for help with managing patent's HTN, she says that transportation is an issue as they currently have no working care and both of her sons work  Lawyer) related to CHF, CAD, HTN, and DMII:  Over the next 30 days, patient will:  . Work with the care management team to address educational, disease  management, and care coordination needs  . Begin or continue self health monitoring activities as directed today  - work with chronic care management staff and internal medicine team to get blood pressure to target . Call provider office for new or worsened signs and symptoms Blood pressure findings outside established parameters . Call care management team with questions or concerns . Verbalize basic understanding of patient centered plan of care established today  Interventions related to CHF, CAD, HTN, and DMII:  . Evaluation of current treatment plans and patient's adherence to plan as established by provider . Assessed patient understanding of disease states . Assessed patient's education and care coordination needs . Provided disease specific education to patient  . Collaborated with appropriate clinical care team members regarding patient needs . Encouraged patient's wife to make appointment for patient to have diabetic eye exam - explained that Medicare covers the cost of a diabetic eye exam to check for diabetic retinopathy once yearly and that a referral is not needed . Referral place to Care Guide for transportation assistance  Patient Self Care Activities related to CHF, CAD, HTN, and DMII:  . Patient is unable to independently self-manage chronic health conditions  Initial goal documentation       Other   . Blood Pressure < 140/90       Not meeting target for blood pressure    . HEMOGLOBIN A1C < 7.0       Most recent Hgb A1C = 6.3% on 09/10/19    . LDL CALC < 70       Discussed importance of annual lipid profile and target for LDL in light of CAD and previous stroke        [name] was given information about Chronic Care Management services today including:  1. CCM service includes personalized support from designated clinical staff supervised by his physician, including individualized plan of care and coordination with other care providers 2. 24/7 contact phone  numbers for assistance for urgent and routine care needs. 3. Service will only be billed when office clinical staff spend 20 minutes or more in a month to coordinate care. 4. Only one practitioner may furnish and bill the service in a calendar month. 5. The patient may stop CCM services at any time (effective at the end of the month) by phone call to the office staff. 6. The patient will be responsible for cost sharing (co-pay) of up to 20% of the service fee (after annual deductible is met).  Patient's wife  agreed to services for patient and verbal consent obtained.   Plan:   The care management team will reach out to the patient again over the next 30 days.   Kelli Churn RN, CCM, [facility] RN Care Manager [phone]

## 2019-10-12 NOTE — Patient Instructions (Signed)
Visit Information  Goals Addressed            This Visit's Progress     Patient Stated   . Wife states " We could use some help with his blood pressure. He hasn't had his eyes checked in 3-4 years." (pt-stated)       CARE PLAN ENTRY (see longitudinal plan of care for additional care plan information)  Current Barriers:  . Chronic Disease Management support, education, and care coordination needs related to Atrial Fibrillation, CHF, CAD, HTN, and DM II- patient's wife states they speak with a Kidron on a regular basis and she and patient are willing to work with the clinic chronic care management staff for additional support with chronic disease management , especially for help with managing patent's HTN, she says that transportation is an issue as they currently have no working care and both of her son's work  Lawyer) related to Atrial Fibrillation, CHF, CAD, HTN, and DM II:  Over the next 30 days, patient will:  . Work with the care management team to address educational, disease management, and care coordination needs  . Begin or continue self health monitoring activities as directed today  - work with chronic care management staff and internal medicine team to get blood pressure to target . Call provider office for new or worsened signs and symptoms Blood pressure findings outside established parameters . Call care management team with questions or concerns . Verbalize basic understanding of patient centered plan of care established today  Interventions related to Atrial Fibrillation, CHF, CAD, HTN, and DM II:  . Evaluation of current treatment plans and patient's adherence to plan as established by provider . Assessed patient understanding of disease states . Assessed patient's education and care coordination needs . Provided disease specific education to patient  . Collaborated with appropriate clinical care team members  regarding patient needs . Encouraged patient's wife to make appointment for patient to have diabetic eye exam - explained that Medicare covers the cost of a diabetic eye exam to check for diabetic retinopathy once yearly and that a referral is not needed . Referral place to Care Guide for transportation assistance  Patient Self Care Activities related to Atrial Fibrillation, CHF, CAD, HTN, and DM II:  . Patient is unable to independently self-manage chronic health conditions  Initial goal documentation       Other   . Blood Pressure < 140/90       Not meeting target for blood pressure    . HEMOGLOBIN A1C < 7.0       Most recent Hgb A1C = 6.3% on 09/10/19    . LDL CALC < 70       Discussed importance of annual lipid profile and target for LDL in light of CAD and previous stroke       Patrick [name] was given information about Chronic Care Management services today including:  1. CCM service includes personalized support from designated clinical staff supervised by his physician, including individualized plan of care and coordination with other care providers 2. 24/7 contact phone numbers for assistance for urgent and routine care needs. 3. Service will only be billed when office clinical staff spend 20 minutes or more in a month to coordinate care. 4. Only one practitioner may furnish and bill the service in a calendar month. 5. The patient may stop CCM services at any time (effective at the end of the month) by phone  call to the office staff. 6. The patient will be responsible for cost sharing (co-pay) of up to 20% of the service fee (after annual deductible is met).  Patient agreed to services and verbal consent obtained.   The patient verbalized understanding of instructions provided today and declined a print copy of patient instruction materials.   The care management team will reach out to the patient again over the next 30 days.   Kelli Churn RN, CCM, [facility] RN Care  Manager [phone]

## 2019-10-13 ENCOUNTER — Other Ambulatory Visit: Payer: Self-pay | Admitting: Pharmacist

## 2019-10-13 DIAGNOSIS — Z7901 Long term (current) use of anticoagulants: Secondary | ICD-10-CM

## 2019-10-13 DIAGNOSIS — I6381 Other cerebral infarction due to occlusion or stenosis of small artery: Secondary | ICD-10-CM

## 2019-10-13 DIAGNOSIS — I63219 Cerebral infarction due to unspecified occlusion or stenosis of unspecified vertebral arteries: Secondary | ICD-10-CM

## 2019-10-14 ENCOUNTER — Telehealth: Payer: Self-pay

## 2019-10-14 NOTE — Telephone Encounter (Signed)
10/14/2019 Spoke with patient, it was not a good  time for her to talk. She requested that I call her Monday 3/22  afternoon to complete SCAT applications for transportation.  Ambrose Mantle [phone]

## 2019-10-15 ENCOUNTER — Ambulatory Visit (INDEPENDENT_AMBULATORY_CARE_PROVIDER_SITE_OTHER): Payer: Medicare Other | Admitting: Internal Medicine

## 2019-10-15 ENCOUNTER — Other Ambulatory Visit: Payer: Self-pay

## 2019-10-15 DIAGNOSIS — I10 Essential (primary) hypertension: Secondary | ICD-10-CM

## 2019-10-15 DIAGNOSIS — Z7901 Long term (current) use of anticoagulants: Secondary | ICD-10-CM | POA: Diagnosis not present

## 2019-10-15 NOTE — Progress Notes (Signed)
Internal Medicine Clinic Resident   I have personally reviewed this encounter including the documentation in this note and/or discussed this patient with the care management provider. I will address any urgent items identified by the care management provider and will communicate my actions to the patient's PCP. I have reviewed the patient's CCM visit with my supervising attending.  Jeanmarie Hubert, MD 10/15/2019

## 2019-10-15 NOTE — Assessment & Plan Note (Addendum)
(  Televisit) Reports compliance to Warfarin (dose recently adjusted by Dr. Elie Confer) Denies any bleeding.  -Continue Warfarin -F/u with Dr. Elie Confer for INR check. Appreciate his care and follow up.

## 2019-10-15 NOTE — Progress Notes (Signed)
  [facility] Telephone Encounter Continuity Care Appointment  HPI:   This telephone encounter was created for Patrick [name] on 10/15/2019 for the following purpose/cc of follow up of hypertension.  Per patient and his wife's request, we changed his in office appointment to a televisit.  Blood pressure has been elevated on prior visits in setting of noncompliance to medications.     Patient and his wife endorse that he has taken all of his medications since then.  He did check his blood pressure at home few days in a week. Mostly at 140s. Denies any headache, dizziness, chest pain. No other complaint today. Please refer to problem based charting for further details and assessment and plan.   Past Medical History:  Past Medical History:  Diagnosis Date  . Atrial fibrillation ([place])   . CAD (coronary artery disease)   . Chronic anticoagulation, on coumadin 07/17/2013  . CKD (chronic kidney disease), stage II   . 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 Depression   . Diabetes mellitus type II    Insulin dependent  . GERD (gastroesophageal reflux disease)   . Gout   . Hyperlipidemia   . Hypertension   . Myocardial infarction [facility]) 2000's   "near heart attack" (07/14/2013)  . Paroxysmal atrial fibrillation ([facility])    on coumadin  . Permanent atrial fibrillation ([place]) 07/17/2013  . Shortness of breath    "can happen at any time" (07/14/2013)  . Sleep apnea 07/2010   "has mask at home; seldom uses it" (07/14/2013)  . Stomach ulcer 1950's   "as a teenager"  . Stroke [facility]) ~ 2007; ~ 2009   "memory not as good since" (07/14/2013)  . Tachy-brady syndrome ([place]) 07/17/2013  . Upper GI bleeding 07/01/2013     ROS:   Constitutional: Negative for chills and fever.  Respiratory: Negative for shortness of breath.   Cardiovascular: Negative for chest pain and no worsening of leg swelling.  Gastrointestinal:  Negative for abdominal pain, nausea and vomiting.   Hem/onc: negative for bleeding   Assessment / Plan / Recommendations:   Please see A&P under problem oriented charting for assessment of the patient's acute and chronic medical conditions.   As always, pt is advised that if symptoms worsen or new symptoms arise, they should go to an urgent care facility or to to ER for further evaluation.   Consent and Medical Decision Making:   Patient discussed with Dr. Rebeca Alert  This is a telephone encounter between Patrick [name] and Zacharia Sowles on 10/15/2019 for HTN follow up. The visit was conducted with the patient located at home and [facility]. The patient's identity was confirmed using their DOB and current address. The patient has consented to being evaluated through a telephone encounter and understands the associated risks (an examination cannot be done and the patient may need to come in for an appointment) / benefits (allows the patient to remain at home, decreasing exposure to coronavirus). I personally spent 15 minutes on medical discussion.

## 2019-10-15 NOTE — Assessment & Plan Note (Signed)
(  Televisit) Patient and his wife requested tele visit as they can not come to the clinic for in-person visit. They states that he takes his medications every day and as instructed. (including Coreg BID.)    Wife is able to provide me BP measures of last 2-3 days: 140/70, 162/54, 136/63, 135/76. She has not documented the BP from last week but mentions that it was around 130-140s most of the time and no SBP >160. She can not provide me the HR.  I encouraged them to keep checking BP and document that.  I recommended them to have an in person visit but wife states that they need a break.   -The BP reported by patient suggest improvement. How ever, they can not provide many BP numbers to me. I will continue current regimen and asked patient and his wife for more BP check. -Advised to check BP at least twice a day and document it in BP log -Arrange a televisit appointment in 2 weeks or sooner if his BP average >150-160/90s. -Patient and his wife agree with the plan and appreciate the call

## 2019-10-16 NOTE — Progress Notes (Signed)
Internal Medicine Clinic Attending  Case discussed with Dr. [name] at the time of the visit.  We reviewed the resident's history and exam and pertinent patient test results.  I agree with the assessment, diagnosis, and plan of care documented in the resident's note.  Shalyn Koral, M.D., Ph.D.  

## 2019-10-19 ENCOUNTER — Other Ambulatory Visit: Payer: Self-pay | Admitting: Internal Medicine

## 2019-10-20 ENCOUNTER — Telehealth: Payer: Self-pay

## 2019-10-20 NOTE — Telephone Encounter (Signed)
10/20/2019 Spoke with patient's wife to complete SCAT application, she is listed as his personal care attendent.  Emailed application to Kelli Churn to complete the healthcare professional section of the application and mail to the patient.  Ambrose Mantle [phone]

## 2019-11-03 ENCOUNTER — Telehealth: Payer: Self-pay

## 2019-11-03 ENCOUNTER — Telehealth: Payer: Medicare Other

## 2019-11-03 NOTE — Telephone Encounter (Signed)
Spoke with patient's wife she has not received the SCAT application in the mail.  I will call her later in the week. Patrick [name] [phone]

## 2019-11-05 ENCOUNTER — Telehealth: Payer: Self-pay

## 2019-11-05 ENCOUNTER — Telehealth: Payer: Medicare Other

## 2019-11-05 ENCOUNTER — Other Ambulatory Visit: Payer: Self-pay | Admitting: Internal Medicine

## 2019-11-05 DIAGNOSIS — I10 Essential (primary) hypertension: Secondary | ICD-10-CM

## 2019-11-05 NOTE — Telephone Encounter (Signed)
11/05/19 Spoke with patien'ts wife Jaan Fischel regardingg SCAT application.  Patient has to pick up her mail from a box at the post office and has not checked this week.  She stated she would try to go today.  Will follow-up on 11/09/19. Ambrose Mantle [phone]

## 2019-11-06 ENCOUNTER — Ambulatory Visit: Payer: Medicare Other | Admitting: *Deleted

## 2019-11-06 DIAGNOSIS — I10 Essential (primary) hypertension: Secondary | ICD-10-CM

## 2019-11-06 DIAGNOSIS — E113419 Type 2 diabetes mellitus with severe nonproliferative diabetic retinopathy with macular edema, unspecified eye: Secondary | ICD-10-CM

## 2019-11-06 NOTE — Progress Notes (Signed)
Internal Medicine Clinic Resident   I have personally reviewed this encounter including the documentation in this note and/or discussed this patient with the care management provider. I will address any urgent items identified by the care management provider and will communicate my actions to the patient's PCP. I have reviewed the patient's CCM visit with my supervising attending.  Zephyra Bernardi D [place] Throckmorton, DO 11/06/2019    

## 2019-11-06 NOTE — Patient Instructions (Signed)
Visit Information It was nice speaking with you and your wife today. Please write down your blood pressure readings in the calendar I mailed you and bring the calendar to your provider appointments.   Goals Addressed            This Visit's Progress     Patient Stated   . Wife states " We could use some help with his blood pressure. He hasn't had his eyes checked in 3-4 years." (pt-stated)       CARE PLAN ENTRY (see longitudinal plan of care for additional care plan information)  Current Barriers:  . Chronic Disease Management support, education, and care coordination needs related to Atrial Fibrillation, CHF, CAD, HTN, and DMII- patient's wife says she has had many provider appointments recently and received injections in her eyes and her knee so  patient has been checking his blood pressure instead of his wife. Neither Mrs Heinicke or patient could provide any blood pressure readings and patient says he does not write them down. He agrees to receiving a Scientist, clinical (histocompatibility and immunogenetics) spiral bound calendar via mail so  that BP readings can be recorded  Clinical Goal(s) related to Atrial Fibrillation, CHF, CAD, HTN, and DMII:  Over the next 30 days, patient will:  . Work with the care management team to address educational, disease management, and care coordination needs  . Begin or continue self health monitoring activities as directed today  - work with chronic care management staff and internal medicine team to get blood pressure to target . Call provider office for new or worsened signs and symptoms Blood pressure findings outside established parameters . Call care management team with questions or concerns . Verbalize basic understanding of patient centered plan of care established today  Interventions related to Atrial Fibrillation, CHF, CAD, HTN, and DMII:  . Assessed frequency and results of home BP monitoring . Assessed medication taking behavior . Mailed [place] Management spiral bound calendar with stop light and action plans and CBG and BP logs to patient and instructed him to write BP readings in the appropriate section  of the calender . Mailed Emmi education articles: Checking Your Blood Pressure At Home;  and Controlling Your Blood Pressure Through Lifestyle  Patient Self Care Activities related to Atrial Fibrillation, CHF, CAD, HTN, and DMII:  . Patient is unable to independently self-manage chronic health conditions  Please see past updates related to this goal by clicking on the "Past Updates" button in the selected goal         The patient verbalized understanding of instructions provided today and declined a print copy of patient instruction materials.   The care management team will reach out to the patient again over the next 30 days.   Kelli Churn RN, CCM, [facility] RN Care Manager [phone]

## 2019-11-06 NOTE — Chronic Care Management (AMB) (Signed)
Chronic Care Management   Follow Up Note   11/06/2019 Name: Patrick [name] MRN: [id number] DOB: 1946/07/21  Referred by: [name], MD Reason for referral : Chronic Care Management (HTN, DM, CKD)   Patrick [name] is a 74 y.o. year old male who is a primary care patient of [name], Dorthula Rue, MD. The CCM team was consulted for assistance with chronic disease management and care coordination needs.    Review of patient status, including review of consultants reports, relevant laboratory and other test results, and collaboration with appropriate care team members and the patient's provider was performed as part of comprehensive patient evaluation and provision of chronic care management services.    SDOH (Social Determinants of Health) assessments performed: No See Care Plan activities for detailed interventions related to [facility])     Outpatient Encounter Medications as of 11/06/2019  Medication Sig  . carvedilol (COREG) 3.125 MG tablet TAKE 1 TABLET BY MOUTH TWICE DAILY WITH A MEAL  . amLODipine (NORVASC) 10 MG tablet Take 1 tablet (10 mg total) by mouth daily.  . Blood Glucose Monitoring Suppl (ONETOUCH VERIO) w/Device KIT 1 strip 4 (four) times daily by Does not apply route. Pharmacist, if not covered, provide [employer] Relion Meter, please counsel (Patient not taking: Reported on 10/07/2019)  . diclofenac sodium (VOLTAREN) 1 % GEL Apply 4 g topically 4 (four) times daily. (Patient not taking: Reported on 10/07/2019)  . docusate sodium (COLACE) 100 MG capsule Take 100 mg by mouth 2 (two) times daily.  . furosemide (LASIX) 40 MG tablet Take 1 tablet by mouth once daily  . glucose blood (ONE TOUCH ULTRA TEST) test strip Use four times daily before meals and at bedtime. Diagnosis code [phone] (Patient not taking: Reported on 10/07/2019)  . ONETOUCH DELICA LANCETS 29J MISC 1 strip 4 (four) times daily -  before meals and at bedtime by Does not apply route. (Patient not taking: Reported on  10/07/2019)  . warfarin (COUMADIN) 4 MG tablet TAKE 1 TABLET BY MOUTH ONCE DAILY EXCEPT  TAKE  1  AND  1/2  TABLETS  EVERY  TUESDAY  AND  THURSDAY   No facility-administered encounter medications on file as of 11/06/2019.     Objective:  BP Readings from Last 3 Encounters:  09/10/19 (!) 190/88  10/02/18 (!) 170/84  08/12/18 (!) 163/89    Goals Addressed            This Visit's Progress     Patient Stated   . Wife states " We could use some help with his blood pressure. He hasn't had his eyes checked in 3-4 years." (pt-stated)       CARE PLAN ENTRY (see longitudinal plan of care for additional care plan information)  Current Barriers:  . Chronic Disease Management support, education, and care coordination needs related to Atrial Fibrillation, CHF, CAD, HTN, and DMII- patient's wife says she has had many provider appointments recently and received injections in her eyes and her knee so  patient has been checking his blood pressure instead of his wife. Neither Mrs Debruyne or patient could provide any blood pressure readings and patient says he does not write them down. He agrees to receiving a Scientist, clinical (histocompatibility and immunogenetics) spiral bound calendar via mail so  that BP readings can be recorded  Clinical Goal(s) related to Atrial Fibrillation, CHF, CAD, HTN, and DMII:  Over the next 30 days, patient will:  . Work with the care management team to address educational, disease management, and  care coordination needs  . Begin or continue self health monitoring activities as directed today  - work with chronic care management staff and internal medicine team to get blood pressure to target . Call provider office for new or worsened signs and symptoms Blood pressure findings outside established parameters . Call care management team with questions or concerns . Verbalize basic understanding of patient centered plan of care established today  Interventions related to Atrial Fibrillation,  CHF, CAD, HTN, and DMII:  . Assessed frequency and results of home BP monitoring . Assessed medication taking behavior . Mailed [place] Management spiral bound calendar with stop light and action plans and CBG and BP logs to patient and instructed him to write BP readings in the appropriate section  of the calender . Mailed Emmi education articles: Checking Your Blood Pressure At Home;  and Controlling Your Blood Pressure Through Lifestyle  Patient Self Care Activities related to Atrial Fibrillation, CHF, CAD, HTN, and DMII:  . Patient is unable to independently self-manage chronic health conditions  Please see past updates related to this goal by clicking on the "Past Updates" button in the selected goal          Plan:   The care management team will reach out to the patient again over the next 30 days.    Kelli Churn RN, CCM, [facility] RN Care Manager [phone]

## 2019-11-10 ENCOUNTER — Telehealth: Payer: Self-pay

## 2019-11-10 NOTE — Telephone Encounter (Signed)
11/10/19 Spoke with patient's wife Shayn Madole, they have not received the SCAT application in the mail.  Ms. Crocket has my contact number and will call me when she receives it.  Her son is picking up the mail from the post office today.  Patient does not have any other needs. Closing referral. Ambrose Mantle [phone]

## 2019-11-10 NOTE — Progress Notes (Signed)
Internal Medicine Clinic Attending  CCM services provided by the care management provider and their documentation were discussed with Dr. Bloomfield. We reviewed the pertinent findings, urgent action items addressed by the resident and non-urgent items to be addressed by the PCP.  I agree with the assessment, diagnosis, and plan of care documented in the CCM and resident's note.  Birdie Fetty C Surina Storts, DO 11/10/2019  

## 2019-11-20 ENCOUNTER — Other Ambulatory Visit: Payer: Self-pay | Admitting: Pharmacist

## 2019-11-20 DIAGNOSIS — I6381 Other cerebral infarction due to occlusion or stenosis of small artery: Secondary | ICD-10-CM

## 2019-11-20 DIAGNOSIS — I63219 Cerebral infarction due to unspecified occlusion or stenosis of unspecified vertebral arteries: Secondary | ICD-10-CM

## 2019-11-20 DIAGNOSIS — Z7901 Long term (current) use of anticoagulants: Secondary | ICD-10-CM

## 2019-12-01 ENCOUNTER — Telehealth: Payer: Medicare Other

## 2019-12-02 ENCOUNTER — Other Ambulatory Visit: Payer: Self-pay | Admitting: *Deleted

## 2019-12-02 NOTE — Patient Outreach (Signed)
[facility]) Care Management  12/02/2019  Alcario Tinkey 04-14-1946 [id number]   RN Health Coach Case Closure  Referral Date:09/15/2019 Referral Source:MD Office Screening Reason for Referral:Disease Management Education Insurance:Medicare   Outreach Attempt:  Per chart review patient is now engaged with Chronic Care Management Embedded Team at primary care office.  Plan:  RN Health Coach will close Disease Management Case at this time.  [place] [phone] [email or web address]

## 2019-12-03 ENCOUNTER — Telehealth: Payer: Medicare Other

## 2019-12-03 ENCOUNTER — Ambulatory Visit: Payer: Self-pay | Admitting: *Deleted

## 2019-12-03 DIAGNOSIS — E113419 Type 2 diabetes mellitus with severe nonproliferative diabetic retinopathy with macular edema, unspecified eye: Secondary | ICD-10-CM

## 2019-12-03 DIAGNOSIS — I10 Essential (primary) hypertension: Secondary | ICD-10-CM

## 2019-12-03 NOTE — Chronic Care Management (AMB) (Signed)
  Chronic Care Management   Note  12/03/2019 Name: Patrick [name] MRN: [id number] DOB: 1946/03/19  Returned call per wife's earlier request of today to complete follow up assessment;  she states they are now eating dinner.   Follow up plan: Per wife's request will call tomorrow between 1-4 pm.   Kelli Churn RN, CCM, [facility] RN Care Manager [phone]

## 2019-12-03 NOTE — Chronic Care Management (AMB) (Signed)
  Chronic Care Management   Note  12/03/2019 Name: Patrick [name] MRN: [id number] DOB: 1945/08/26   Spoke with patient then wife to explain purpose of call- to complete follow up assessment. Wife requests return call after 4 pm today.   Follow up plan: Will return call after 4 pm today per wife's request.   Kelli Churn RN, CCM, [facility] RN Care Manager [phone]

## 2019-12-04 ENCOUNTER — Ambulatory Visit: Payer: Medicare Other | Admitting: *Deleted

## 2019-12-04 DIAGNOSIS — I10 Essential (primary) hypertension: Secondary | ICD-10-CM

## 2019-12-04 DIAGNOSIS — E113419 Type 2 diabetes mellitus with severe nonproliferative diabetic retinopathy with macular edema, unspecified eye: Secondary | ICD-10-CM

## 2019-12-04 NOTE — Chronic Care Management (AMB) (Signed)
  Chronic Care Management   Note  12/04/2019 Name: Patrick [name] MRN: [id number] DOB: Nov 21, 1945   Unable to call patient today between 1p-4p per wife's request so will follow up with patient and wife next week in attempt to complete follow up assessment.   Follow up plan: The care management team will reach out to the patient again over the next 7 days.   Kelli Churn RN, CCM, [facility] RN Care Manager [phone]

## 2019-12-07 ENCOUNTER — Ambulatory Visit: Payer: Medicare Other | Admitting: *Deleted

## 2019-12-07 DIAGNOSIS — E113419 Type 2 diabetes mellitus with severe nonproliferative diabetic retinopathy with macular edema, unspecified eye: Secondary | ICD-10-CM

## 2019-12-07 DIAGNOSIS — I10 Essential (primary) hypertension: Secondary | ICD-10-CM

## 2019-12-07 NOTE — Patient Instructions (Signed)
Visit Information It was nice speaking with you today Please make an appointment to be seen in the clinic before the end of May for a blood pressure and Hgb A1C check  Goals Addressed            This Visit's Progress     Patient Stated   . Wife states " We could use some help with his blood pressure. He hasn't had his eyes checked in 3-4 years." (pt-stated)       CARE PLAN ENTRY (see longitudinal plan of care for additional care plan information)  Current Barriers:  . Chronic Disease Management support, education, and care coordination needs related to Atrial Fibrillation, CHF, CAD, HTN, and DMII- spoke with both patient and wife, patient says his blood pressure is "OK" but when asked for specific readings as he has a home monitor, neither could provide any;  patient then said his monitor wasn't working correctly, wife says patient does not check his blood sugars, she reiterates they are dependent on their sons and/or daughter in law to provide transportation and therefore it is very hard to get into the clinic for face to face appointments.   Clinical Goal(s) related to Atrial Fibrillation, CHF, CAD, HTN, and DMII:  Over the next 30-60 days, patient will:  . Work with the care management team to address educational, disease management, and care coordination needs  . Begin or continue self health monitoring activities as directed today  - work with chronic care management staff and internal medicine team to get blood pressure to target . Call provider office for new or worsened signs and symptoms Blood pressure findings outside established parameters . Call care management team with questions or concerns . Verbalize basic understanding of patient centered plan of care established today  Interventions related to Atrial Fibrillation, CHF, CAD, HTN, and DMII:  . Attempted to  assess frequency and results of home BP monitoring . Assessed medication taking behavior . Reminded patient that  high blood pressure may not have any symptoms until and heart attack or stroke occur and thus the importance of routine monitoring . Encouraged patient and wife to make clinic appointment before the end of May for follow up Hgb A1C and BP check and to bring patient's home BP monitor with them to check readings against office readings   Patient Self Care Activities related to Atrial Fibrillation, CHF, CAD, HTN, and DMII:  . Patient is unable to independently self-manage chronic health conditions  Please see past updates related to this goal by clicking on the "Past Updates" button in the selected goal         The patient verbalized understanding of instructions provided today and declined a print copy of patient instruction materials.   The care management team will reach out to the patient again over the next 30 days.   Kelli Churn RN, CCM, [facility] RN Care Manager [phone]

## 2019-12-07 NOTE — Chronic Care Management (AMB) (Signed)
Chronic Care Management   Follow Up Note   12/07/2019 Name: Patrick [name] MRN: [id number] DOB: 09-26-1945  Referred by: [name], MD Reason for referral : Chronic Care Management (HTN, DM)   Patrick [name] is a 74 y.o. year old male who is a primary care patient of [name], Dorthula Rue, MD. The CCM team was consulted for assistance with chronic disease management and care coordination needs.    Review of patient status, including review of consultants reports, relevant laboratory and other test results, and collaboration with appropriate care team members and the patient's provider was performed as part of comprehensive patient evaluation and provision of chronic care management services.    SDOH (Social Determinants of Health) assessments performed: No See Care Plan activities for detailed interventions related to SDOH)   Objective:    BP Readings from Last 3 Encounters:  09/10/19 (!) 190/88  10/02/18 (!) 170/84  08/12/18 (!) 163/89   Lab Results  Component Value Date   HGBA1C 6.3 (A) 09/10/2019   HGBA1C 7.4 (A) 06/04/2018   HGBA1C 9.4 06/10/2017   Lab Results  Component Value Date   MICROALBUR 94.9 (H) 10/18/2014   LDLCALC 65 11/20/2015   CREATININE 2.39 (H) 09/10/2019    Lab Results  Component Value Date   MICROALBUR 94.9 (H) 10/18/2014   MICROALBUR 37.60 (H) 10/27/2010   Needs updated urine for protein  Outpatient Encounter Medications as of 12/07/2019  Medication Sig  . carvedilol (COREG) 3.125 MG tablet TAKE 1 TABLET BY MOUTH TWICE DAILY WITH A MEAL  . amLODipine (NORVASC) 10 MG tablet Take 1 tablet (10 mg total) by mouth daily.  . Blood Glucose Monitoring Suppl (ONETOUCH VERIO) w/Device KIT 1 strip 4 (four) times daily by Does not apply route. Pharmacist, if not covered, provide [employer] Relion Meter, please counsel (Patient not taking: Reported on 10/07/2019)  . diclofenac sodium (VOLTAREN) 1 % GEL Apply 4 g topically 4 (four) times daily. (Patient not  taking: Reported on 10/07/2019)  . docusate sodium (COLACE) 100 MG capsule Take 100 mg by mouth 2 (two) times daily.  . furosemide (LASIX) 40 MG tablet Take 1 tablet by mouth once daily  . glucose blood (ONE TOUCH ULTRA TEST) test strip Use four times daily before meals and at bedtime. Diagnosis code [phone] (Patient not taking: Reported on 10/07/2019)  . ONETOUCH DELICA LANCETS 62B MISC 1 strip 4 (four) times daily -  before meals and at bedtime by Does not apply route. (Patient not taking: Reported on 10/07/2019)  . warfarin (COUMADIN) 4 MG tablet TAKE 1 TABLET BY MOUTH ONCE DAILY EXCEPT TAKE 1 & 1/2 TABLETS EVERY TUESDAY AND THURSDAY   No facility-administered encounter medications on file as of 12/07/2019.       Goals Addressed            This Visit's Progress     Patient Stated   . Wife states " We could use some help with his blood pressure. He hasn't had his eyes checked in 3-4 years." (pt-stated)       CARE PLAN ENTRY (see longitudinal plan of care for additional care plan information)  Current Barriers:  . Chronic Disease Management support, education, and care coordination needs related to Atrial Fibrillation, CHF, CAD, HTN, and DMII- spoke with both patient and wife, patient says his blood pressure is "OK" but when asked for specific readings as he has a home monitor, neither could provide any;  patient then said his monitor wasn't working correctly, wife says patient  does not check his blood sugars, she reiterates they are dependent on their sons and/or daughter in law to provide transportation and therefore it is very hard to get into the clinic for face to face appointments.   Clinical Goal(s) related to Atrial Fibrillation, CHF, CAD, HTN, and DMII:  Over the next 30-60 days, patient will:  . Work with the care management team to address educational, disease management, and care coordination needs  . Begin or continue self health monitoring activities as directed today  -  work with chronic care management staff and internal medicine team to get blood pressure to target . Call provider office for new or worsened signs and symptoms Blood pressure findings outside established parameters . Call care management team with questions or concerns . Verbalize basic understanding of patient centered plan of care established today  Interventions related to Atrial Fibrillation, CHF, CAD, HTN, and DMII:  . Attempted to  assess frequency and results of home BP monitoring . Assessed medication taking behavior . Reminded patient that high blood pressure may not have any symptoms until and heart attack or stroke occur and thus the importance of routine monitoring . Encouraged patient and wife to make clinic appointment before the end of May for follow up Hgb A1C and BP check and to bring patient's home BP monitor with them to check readings against office readings   Patient Self Care Activities related to Atrial Fibrillation, CHF, CAD, HTN, and DMII:  . Patient is unable to independently self-manage chronic health conditions  Please see past updates related to this goal by clicking on the "Past Updates" button in the selected goal          Plan:   The care management team will reach out to the patient again over the next 30 days.    Kelli Churn RN, CCM, [facility] RN Care Manager [phone]

## 2019-12-14 NOTE — Progress Notes (Addendum)
Internal Medicine Clinic Resident  I have personally reviewed this encounter including the documentation in this note and/or discussed this patient with the care management provider. I will address any urgent items identified by the care management provider and will communicate my actions to the patient's PCP. I have reviewed the patient's CCM visit with my supervising attending, Dr Raines.  Joshua K Lee, MD 12/14/2019   

## 2019-12-23 ENCOUNTER — Other Ambulatory Visit: Payer: Self-pay | Admitting: Internal Medicine

## 2019-12-23 DIAGNOSIS — I10 Essential (primary) hypertension: Secondary | ICD-10-CM

## 2019-12-31 ENCOUNTER — Ambulatory Visit (INDEPENDENT_AMBULATORY_CARE_PROVIDER_SITE_OTHER): Payer: Medicare Other | Admitting: Internal Medicine

## 2019-12-31 ENCOUNTER — Encounter: Payer: Self-pay | Admitting: Internal Medicine

## 2019-12-31 VITALS — BP 169/106 | HR 63 | Temp 97.8°F | Ht 68.0 in | Wt 166.5 lb

## 2019-12-31 DIAGNOSIS — N184 Chronic kidney disease, stage 4 (severe): Secondary | ICD-10-CM

## 2019-12-31 DIAGNOSIS — I10 Essential (primary) hypertension: Secondary | ICD-10-CM

## 2019-12-31 DIAGNOSIS — Z7901 Long term (current) use of anticoagulants: Secondary | ICD-10-CM | POA: Diagnosis not present

## 2019-12-31 DIAGNOSIS — E118 Type 2 diabetes mellitus with unspecified complications: Secondary | ICD-10-CM | POA: Diagnosis not present

## 2019-12-31 DIAGNOSIS — I5022 Chronic systolic (congestive) heart failure: Secondary | ICD-10-CM

## 2019-12-31 DIAGNOSIS — R600 Localized edema: Secondary | ICD-10-CM

## 2019-12-31 DIAGNOSIS — I63219 Cerebral infarction due to unspecified occlusion or stenosis of unspecified vertebral arteries: Secondary | ICD-10-CM

## 2019-12-31 DIAGNOSIS — E113419 Type 2 diabetes mellitus with severe nonproliferative diabetic retinopathy with macular edema, unspecified eye: Secondary | ICD-10-CM

## 2019-12-31 DIAGNOSIS — E1165 Type 2 diabetes mellitus with hyperglycemia: Secondary | ICD-10-CM | POA: Diagnosis not present

## 2019-12-31 DIAGNOSIS — I6322 Cerebral infarction due to unspecified occlusion or stenosis of basilar arteries: Secondary | ICD-10-CM

## 2019-12-31 DIAGNOSIS — IMO0002 Reserved for concepts with insufficient information to code with codable children: Secondary | ICD-10-CM

## 2019-12-31 LAB — POCT GLYCOSYLATED HEMOGLOBIN (HGB A1C): Hemoglobin A1C: 6.1 % — AB (ref 4.0–5.6)

## 2019-12-31 LAB — GLUCOSE, CAPILLARY: Glucose-Capillary: 108 mg/dL — ABNORMAL HIGH (ref 70–99)

## 2019-12-31 LAB — POCT INR: INR: 1.3 — AB (ref 2.0–3.0)

## 2019-12-31 MED ORDER — WARFARIN SODIUM 4 MG PO TABS: ORAL_TABLET | ORAL | 0 refills | Status: DC

## 2019-12-31 MED ORDER — CARVEDILOL 6.25 MG PO TABS: 6.2500 mg | ORAL_TABLET | Freq: Two times a day (BID) | ORAL | 3 refills | Status: DC

## 2019-12-31 MED ORDER — FUROSEMIDE 40 MG PO TABS: 80.0000 mg | ORAL_TABLET | Freq: Every day | ORAL | 0 refills | Status: DC

## 2019-12-31 NOTE — Patient Instructions (Addendum)
Thank you for visiting [name] in clinic today.  Below is a summary of what we discussed:  1.  Hypertension -Your blood pressure was elevated today.  -Please continue taking amlodipine 10 mg daily and we increased your  tocarvedilol 6.25 mg daily  2.  Leg swelling -Your leg swelling is due to increased fluid in your legs. -I want you to increase your furosemide to 80 mg daily over the next few days and follow-up with [name] in clinic on Monday 6/7.   3.  Warfarin levels -We took a blood sample to check your INR to check your warfarin levels.  Our goal is to have your INR between 2 and 3.   -Please schedule an appointment with Dr. Johney Maine in the Coumadin clinic so that he can help you dose this medication. -I have refilled the warfarin medication.  You can pick this up at the [facility].  4.  Follow-up -Please schedule a follow-up appointment on Monday 6/7

## 2019-12-31 NOTE — Progress Notes (Signed)
   CC: HTN follow up   HPI:  [name] is a 74 y.o. with a history as noted below who presents today for HTN, follow up. Please refer to the problem based charting for further details.  Past Medical History:  Diagnosis Date  . Atrial fibrillation ([place])   . CAD (coronary artery disease)   . Chronic anticoagulation, on coumadin 07/17/2013  . CKD (chronic kidney disease), stage II   . 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 Depression   . Diabetes mellitus type II    Insulin dependent  . GERD (gastroesophageal reflux disease)   . Gout   . Hyperlipidemia   . Hypertension   . Myocardial infarction [facility]) 2000's   "near heart attack" (07/14/2013)  . Paroxysmal atrial fibrillation (HCC)    on coumadin  . Permanent atrial fibrillation ([place]) 07/17/2013  . Shortness of breath    "can happen at any time" (07/14/2013)  . Sleep apnea 07/2010   "has mask at home; seldom uses it" (07/14/2013)  . Stomach ulcer 1950's   "as a teenager"  . Stroke [facility]) ~ 2007; ~ 2009   "memory not as good since" (07/14/2013)  . Tachy-brady syndrome ([place]) 07/17/2013  . Upper GI bleeding 07/01/2013   Review of Systems: All systems were reviewed and are otherwise negative unless mentioned in the HPI.  Physical Exam: Vitals:   12/31/19 1323 12/31/19 1327  BP: (!) 174/83 (!) 169/106  Pulse: 60 63  Temp: 97.8 F (36.6 C)   TempSrc: Oral   SpO2: 100%   Weight: 166 lb 8 oz (75.5 kg)   Height: 5\' 8"  (1.727 m)    Physical Exam Vitals reviewed.  Constitutional:      General: He is not in acute distress.    Appearance: Normal appearance. He is normal weight. He is ill-appearing. He is not toxic-appearing or diaphoretic.  HENT:     Head: Normocephalic and atraumatic.  Cardiovascular:     Rate and Rhythm: Normal rate and regular rhythm.     Pulses: Normal pulses.     Heart sounds: Normal heart sounds. No murmur. No friction rub. No gallop.     Pulmonary:     Effort: Pulmonary effort is normal. No respiratory distress.     Breath sounds: Normal breath sounds. No wheezing or rales.  Musculoskeletal:     Right lower leg: Edema (2+ to the shin) present.     Left lower leg: Edema (2+ to the shin) present.  Skin:    General: Skin is dry.  Neurological:     Mental Status: He is alert. Mental status is at baseline.  Psychiatric:        Mood and Affect: Mood normal.    Assessment & Plan:   See Encounters Tab for problem based charting.  Patient discussed with Dr. Lynnae January

## 2020-01-01 LAB — BMP8+ANION GAP
Anion Gap: 17 mmol/L (ref 10.0–18.0)
BUN/Creatinine Ratio: 15 (ref 10–24)
BUN: 33 mg/dL — ABNORMAL HIGH (ref 8–27)
CO2: 23 mmol/L (ref 20–29)
Calcium: 8.7 mg/dL (ref 8.6–10.2)
Chloride: 104 mmol/L (ref 96–106)
Creatinine, Ser: 2.26 mg/dL — ABNORMAL HIGH (ref 0.76–1.27)
GFR calc Af Amer: 32 mL/min/{1.73_m2} — ABNORMAL LOW (ref >59)
GFR calc non Af Amer: 28 mL/min/{1.73_m2} — ABNORMAL LOW (ref >59)
Glucose: 110 mg/dL — ABNORMAL HIGH (ref 65–99)
Potassium: 4.3 mmol/L (ref 3.5–5.2)
Sodium: 144 mmol/L (ref 134–144)

## 2020-01-01 NOTE — Assessment & Plan Note (Deleted)
The patient has not had his INR checked since February, when it was 1.9.  He has been taking warfarin 4 mg daily, however ran out of his medication 4 days prior to today's appointment.  His wife states that she usually gets her notification from [facility] when medication needs to be refilled, but did not get one this time around.    Plan -Refilled warfarin 4 mg daily -Rechecked INR today, subtherapeutic at 1.3 -Message sent to the front desk and Dr. Elie Confer that the patient needs a Coumadin clinic appointment on Monday or Tuesday for INR check and warfarin dosing

## 2020-01-01 NOTE — Assessment & Plan Note (Addendum)
Mr. Sargent has had a longstanding history of hypertension.  He is currently taking amlodipine 10 mg daily and carvedilol 3.125 mg twice daily.  He states that he takes his medications consistently every day.  However, his blood pressures were elevated to 174/83 and 169/106 on recheck.  Plan: -Increase carvedilol to 6.25 mg twice daily.  Consider increasing dose at next visit if he continues to be hypertensive -Continue amlodipine 10 mg daily

## 2020-01-01 NOTE — Assessment & Plan Note (Signed)
The patient has not had his INR checked since February, when it was 1.9.  He has been taking warfarin 4 mg daily, however ran out of his medication 4 days prior to today's appointment.  His wife states that she usually gets her notification from [facility] when medication needs to be refilled, but did not get one this time around.    Plan -Refilled warfarin 4 mg daily -Rechecked INR today, subtherapeutic at 1.3 -Message sent to the front desk and Dr. Elie Confer that the patient needs a Coumadin clinic appointment on Monday or Tuesday for INR check and warfarin dosing

## 2020-01-01 NOTE — Assessment & Plan Note (Signed)
Diet-controlled type 2 diabetes.  A1c at 6.1 today -Recheck A1c in 3 months

## 2020-01-01 NOTE — Assessment & Plan Note (Signed)
Patient has a longstanding history of chronic kidney disease.  We will need to recheck BMP prior to increasing dose of Lasix  Plan: -BMP demonstrates creatinine at baseline of 2.26 -Increase Lasix to 80 mg daily from 40 mg -Follow-up at next clinic visit early next week

## 2020-01-01 NOTE — Assessment & Plan Note (Addendum)
Patient denies any shortness of breath, cough or orthopnea but does have increased lower extremity swelling.  On exam, the patient has 2+ pitting edema but has no JVD and has clear breath sounds with normal work of breathing.  Patient does not appear to be in heart failure exacerbation, but is grossly volume overloaded.  Plan: -Increase Lasix to 80 mg daily and follow-up in the clinic visit in the next few days to reassess volume status. -Compression stockings ordered

## 2020-01-01 NOTE — Progress Notes (Signed)
Internal Medicine Clinic Attending  Case discussed with Dr. Alexander at the time of the visit.  We reviewed the resident's history and exam and pertinent patient test results.  I agree with the assessment, diagnosis, and plan of care documented in the resident's note.  

## 2020-01-04 ENCOUNTER — Telehealth: Payer: Medicare Other

## 2020-01-04 ENCOUNTER — Ambulatory Visit (INDEPENDENT_AMBULATORY_CARE_PROVIDER_SITE_OTHER): Payer: Medicare Other

## 2020-01-04 DIAGNOSIS — I6322 Cerebral infarction due to unspecified occlusion or stenosis of basilar arteries: Secondary | ICD-10-CM

## 2020-01-04 DIAGNOSIS — Z7901 Long term (current) use of anticoagulants: Secondary | ICD-10-CM

## 2020-01-04 DIAGNOSIS — I63219 Cerebral infarction due to unspecified occlusion or stenosis of unspecified vertebral arteries: Secondary | ICD-10-CM | POA: Diagnosis not present

## 2020-01-04 DIAGNOSIS — I6381 Other cerebral infarction due to occlusion or stenosis of small artery: Secondary | ICD-10-CM

## 2020-01-04 LAB — POCT INR: INR: 1.4 — AB (ref 2.0–3.0)

## 2020-01-04 NOTE — Patient Instructions (Signed)
Patient instructed to take medications as defined in the Anti-coagulation Track section of this encounter.  Patient instructed to take today's dose.  Patient instructed to take one-and-one-half (1 and 1/2) of your 4mg  strength-blue warfarin tablets on Mondays, Wednesdays and Fridays. All other days (Sundays, Tuesdays, Thursdays and Saturdays, take only one (1) tablet on these days.  Patient verbalized understanding of these instructions.

## 2020-01-04 NOTE — Progress Notes (Signed)
Anticoagulation Management Patrick [name] is a 74 y.o. male who reports to the clinic for monitoring of warfarin treatment.    Indication: atrial fibrillation; long term use of anticoagulant, warfarin.   Duration: indefinite Supervising physician: Gilles Chiquito  Anticoagulation Clinic Visit History: Patient does not report signs/symptoms of bleeding or thromboembolism  Other recent changes: No diet, medications, lifestyle changes endorsed by the patient at this visit to me.  Anticoagulation Episode Summary    Current INR goal:  2.0-3.0  TTR:  45.0 % (6.4 y)  Next INR check:  01/25/2020  INR from last check:  1.4 (01/04/2020)  Weekly max warfarin dose:    Target end date:  Indefinite  INR check location:  Anticoagulation Clinic  Preferred lab:    Send INR reminders to:     Indications   ATRIAL FIBRILLATION PAROXYSMAL CHRONIC (Resolved) [I48.91] Acute ischemic VBA thalamic stroke (HCC) [L07.867 I63.22] Chronic anticoagulation on coumadin [Z79.01]       Comments:          No Known Allergies  Current Outpatient Medications:  .  amLODipine (NORVASC) 10 MG tablet, Take 1 tablet (10 mg total) by mouth daily., Disp: 90 tablet, Rfl: 5 .  Blood Glucose Monitoring Suppl (ONETOUCH VERIO) w/Device KIT, 1 strip 4 (four) times daily by Does not apply route. Pharmacist, if not covered, provide [employer] Relion Meter, please counsel, Disp: 1 kit, Rfl: 0 .  carvedilol (COREG) 6.25 MG tablet, Take 1 tablet (6.25 mg total) by mouth 2 (two) times daily with a meal., Disp: 60 tablet, Rfl: 3 .  diclofenac sodium (VOLTAREN) 1 % GEL, Apply 4 g topically 4 (four) times daily., Disp: 1 Tube, Rfl: 3 .  docusate sodium (COLACE) 100 MG capsule, Take 100 mg by mouth 2 (two) times daily., Disp: , Rfl:  .  furosemide (LASIX) 40 MG tablet, Take 2 tablets (80 mg total) by mouth daily., Disp: 90 tablet, Rfl: 0 .  glucose blood (ONE TOUCH ULTRA TEST) test strip, Use four times daily before meals and at bedtime.  Diagnosis code E11.3419, Disp: 100 each, Rfl: 12 .  ONETOUCH DELICA LANCETS 54G MISC, 1 strip 4 (four) times daily -  before meals and at bedtime by Does not apply route., Disp: 100 each, Rfl: 11 .  warfarin (COUMADIN) 4 MG tablet, TAKE 1 TABLET BY MOUTH ONCE DAILY EXCEPT TAKE 1 & 1/2 TABLETS EVERY TUESDAY AND THURSDAY, Disp: 30 tablet, Rfl: 0 Past Medical History:  Diagnosis Date  . Atrial fibrillation ([place])   . CAD (coronary artery disease)   . Chronic anticoagulation, on coumadin 07/17/2013  . CKD (chronic kidney disease), stage II   . 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 Depression   . Diabetes mellitus type II    Insulin dependent  . GERD (gastroesophageal reflux disease)   . Gout   . Hyperlipidemia   . Hypertension   . Myocardial infarction [facility]) 2000's   "near heart attack" (07/14/2013)  . Paroxysmal atrial fibrillation (HCC)    on coumadin  . Permanent atrial fibrillation ([place]) 07/17/2013  . Shortness of breath    "can happen at any time" (07/14/2013)  . Sleep apnea 07/2010   "has mask at home; seldom uses it" (07/14/2013)  . Stomach ulcer 1950's   "as a teenager"  . Stroke [facility]) ~ 2007; ~ 2009   "memory not as good since" (07/14/2013)  . Tachy-brady syndrome ([place]) 07/17/2013  . Upper GI bleeding 07/01/2013  Social History   Socioeconomic History  . Marital status: Married    Spouse name: Not on file  . Number of children: Not on file  . Years of education: 5  . Highest education level: Not on file  Occupational History  . 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 Smoking status: Former Smoker    Packs/day: 0.50    Years: 15.00    Pack years: 7.50    Types: Cigarettes    Quit date: 10/26/1976    Years since quitting: 43.2  . Smokeless tobacco: Former Systems developer  . Tobacco comment: 07/14/2013 "quit chewing and dipping in ~ 1970"  Substance and Sexual Activity  . Alcohol use: No     Alcohol/week: 0.0 standard drinks    Comment: 07/14/2013 "drank a little; quit in ~ 1970; never had problem w/it"  . Drug use: No  . Sexual activity: Not Currently  Other Topics Concern  . Not on file  Social History Narrative   Former smoker - stopped 1978   Alcohol stopped 1970   Full Code      Current Social History 10/07/2019        Patient lives with spouse Patrick [name]) and 1 son in a two level home.       Patient's method of transportation is via family member (Son or daughter-in-law).      The highest level of education was 5 th grade.      The patient currently retired Engineer, manufacturing systems.      Identified important Relationships are Wife, sons, and grandchildren       Pets : None       Interests / Fun: Watch TV       Current Stressors: No bedroom downstairs. Has to climb 12 steps to bedroom.       Religious / Personal Beliefs: Christian       L. [place], BSN, RN-BC       Social Determinants of Health   Financial Resource Strain:   . Difficulty of Paying Living Expenses:   Food Insecurity:   . Worried About Charity fundraiser in the Last Year:   . Arboriculturist in the Last Year:   Transportation Needs: Unmet Transportation Needs  . Lack of Transportation (Medical): Yes  . Lack of Transportation (Non-Medical): Yes  Physical Activity: Inactive  . Days of Exercise per Week: 0 days  . Minutes of Exercise per Session: 0 min  Stress:   . Feeling of Stress :   Social Connections: Unknown  . Frequency of Communication with Friends and Family: More than three times a week  . Frequency of Social Gatherings with Friends and Family: Not on file  . Attends Religious Services: More than 4 times per year  . Active Member of Clubs or Organizations: Not on file  . Attends Archivist Meetings: Not on file  . Marital Status: Married   Family History  Problem Relation Age of Onset  . Diabetes Mother   . Hypertension Mother   . Heart Problems Father   . Hypertension  Sister   . Diabetes Sister   . Diabetes Sister   . Diabetes Sister   . Cancer Brother     ASSESSMENT Recent Results: The most recent result is correlated with 32 mg per week: Lab Results  Component Value Date   INR 1.4 (A) 01/04/2020   INR 1.3 (A) 12/31/2019   INR 1.9 (A) 09/10/2019  Anticoagulation Dosing: Description   Take one-and-one-half (1 and 1/2) of your 58m strength-blue warfarin tablets on Mondays, Wednesdays and Fridays. All other days (Sundays, Tuesdays, Thursdays and Saturdays, take only one (1) tablet on these days.      INR today: Subtherapeutic but with explanation (had missed 3-4 days last week, as prescription had ran out.   PLAN Weekly dose was increased by 6% to 34 mg per week  Patient Instructions  Patient instructed to take medications as defined in the Anti-coagulation Track section of this encounter.  Patient instructed to take today's dose.  Patient instructed to take one-and-one-half (1 and 1/2) of your 462mstrength-blue warfarin tablets on Mondays, Wednesdays and Fridays. All other days (Sundays, Tuesdays, Thursdays and Saturdays, take only one (1) tablet on these days.  Patient verbalized understanding of these instructions.    Patient advised to contact clinic or seek medical attention if signs/symptoms of bleeding or thromboembolism occur.  Patient verbalized understanding by repeating back information and was advised to contact me if further medication-related questions arise. Patient was also provided an information handout.  Follow-up Return in 3 weeks (on 01/25/2020) for Follow up INR.  JaPennie BanterPharmD, CPP  15 minutes spent face-to-face with the patient during the encounter. 50% of time spent on education, including signs/sx bleeding and clotting, as well as food and drug interactions with warfarin. 50% of time was spent on fingerprick POC INR sample collection,processing, results determination, and documentation in  [email or web address]

## 2020-01-05 ENCOUNTER — Telehealth: Payer: Medicare Other

## 2020-01-05 NOTE — Progress Notes (Signed)
I reviewed the anticoagulation note.  Patient is on coumadin for Afib.  His INR was low and his coumadin dose was increased.

## 2020-01-06 ENCOUNTER — Telehealth: Payer: Medicare Other

## 2020-01-11 ENCOUNTER — Telehealth: Payer: Medicare Other

## 2020-01-11 ENCOUNTER — Ambulatory Visit: Payer: Medicare Other

## 2020-01-12 ENCOUNTER — Ambulatory Visit: Payer: Medicare Other | Admitting: *Deleted

## 2020-01-12 DIAGNOSIS — E785 Hyperlipidemia, unspecified: Secondary | ICD-10-CM

## 2020-01-12 DIAGNOSIS — E113419 Type 2 diabetes mellitus with severe nonproliferative diabetic retinopathy with macular edema, unspecified eye: Secondary | ICD-10-CM

## 2020-01-12 DIAGNOSIS — I10 Essential (primary) hypertension: Secondary | ICD-10-CM

## 2020-01-12 DIAGNOSIS — N184 Chronic kidney disease, stage 4 (severe): Secondary | ICD-10-CM

## 2020-01-12 NOTE — Chronic Care Management (AMB) (Signed)
Chronic Care Management   Follow Up Note   01/12/2020 Name: Patrick [name] MRN: [id number] DOB: 1945/08/07  Referred by: [name], MD Reason for referral : No chief complaint on file.   Patrick [name] is a 74 y.o. year old male who is a primary care patient of [name], Dorthula Rue, MD. The CCM team was consulted for assistance with chronic disease management and care coordination needs.    Review of patient status, including review of consultants reports, relevant laboratory and other test results, and collaboration with appropriate care team members and the patient's provider was performed as part of comprehensive patient evaluation and provision of chronic care management services.    SDOH (Social Determinants of Health) assessments performed: No See Care Plan activities for detailed interventions related to [facility])     Outpatient Encounter Medications as of 01/12/2020  Medication Sig  . amLODipine (NORVASC) 10 MG tablet Take 1 tablet (10 mg total) by mouth daily.  . Blood Glucose Monitoring Suppl (ONETOUCH VERIO) w/Device KIT 1 strip 4 (four) times daily by Does not apply route. Pharmacist, if not covered, provide [employer] Relion Meter, please counsel  . carvedilol (COREG) 6.25 MG tablet Take 1 tablet (6.25 mg total) by mouth 2 (two) times daily with a meal.  . diclofenac sodium (VOLTAREN) 1 % GEL Apply 4 g topically 4 (four) times daily.  Marland Kitchen docusate sodium (COLACE) 100 MG capsule Take 100 mg by mouth 2 (two) times daily.  . furosemide (LASIX) 40 MG tablet Take 2 tablets (80 mg total) by mouth daily.  Marland Kitchen glucose blood (ONE TOUCH ULTRA TEST) test strip Use four times daily before meals and at bedtime. Diagnosis code E11.3419  . ONETOUCH DELICA LANCETS 38V MISC 1 strip 4 (four) times daily -  before meals and at bedtime by Does not apply route.  . warfarin (COUMADIN) 4 MG tablet TAKE 1 TABLET BY MOUTH ONCE DAILY EXCEPT TAKE 1 & 1/2 TABLETS EVERY TUESDAY AND THURSDAY   No  facility-administered encounter medications on file as of 01/12/2020.     Objective:  BP Readings from Last 3 Encounters:  01/04/20 (!) 148/78  12/31/19 (!) 169/106  09/10/19 (!) 190/88   Lab Results  Component Value Date   HGBA1C 6.1 (A) 12/31/2019   HGBA1C 6.3 (A) 09/10/2019   HGBA1C 7.4 (A) 06/04/2018   Lab Results  Component Value Date   MICROALBUR 94.9 (H) 10/18/2014   LDLCALC 65 11/20/2015   CREATININE 2.26 (H) 12/31/2019   Lab Results  Component Value Date   CHOL 128 11/20/2015   HDL 48 11/20/2015   LDLCALC 65 11/20/2015   TRIG 77 11/20/2015   CHOLHDL 2.7 11/20/2015   needs annual lipid panel and urine for protein  Goals Addressed              This Visit's Progress     Patient Stated   .  Wife states " We could use some help with his blood pressure. He hasn't had his eyes checked in 3-4 years." (pt-stated)        CARE PLAN ENTRY (see longitudinal plan of care for additional care plan information)  Current Barriers:  . Chronic Disease Management support, education, and care coordination needs related to Atrial Fibrillation, CHF, CAD, HTN, and DMII- spoke with both patient and wife, patient says he checked his blood pressure this week and the reading  was"OK" but when asked for specific readings he could not provide the information,  wife says patient does not check his  blood sugars, she reiterates they are dependent on their sons and/or daughter in law to provide transportation and therefore it is very hard to get into the clinic for face to face appointments, she says she never received the SCAT application in the mail  Clinical Goal(s) related to Atrial Fibrillation, CHF, CAD, HTN, and DMII:  Over the next 30-60 days, patient will:  . Work with the care management team to address educational, disease management, and care coordination needs  . Begin or continue self health monitoring activities as directed today  - work with chronic care management staff and  internal medicine team to get blood pressure to target . Call provider office for new or worsened signs and symptoms Blood pressure findings outside established parameters . Call care management team with questions or concerns . Verbalize basic understanding of patient centered plan of care established today  Interventions related to Atrial Fibrillation, CHF, CAD, HTN, and DMII:  . Attempted to  assess frequency and results of home BP monitoring . Assessed medication taking behavior . Reminded patient that high blood pressure may not have any symptoms until and heart attack or stroke occur and thus the importance of routine monitoring . Reminded patient and wife of clinic appointment on 6/24 as they both said they were unaware, advised them to bring patient's home BP monitor with them to check readings against office readings and informed them this RN CCM will ask CCM BSW to hand deliver a SCAT application to them  Patient Self Care Activities related to Atrial Fibrillation, CHF, CAD, HTN, and DMII:  . Patient is unable to independently self-manage chronic health conditions  Please see past updates related to this goal by clicking on the "Past Updates" button in the selected goal          Plan:   The care management team will reach out to the patient again over the next 30 days.   Kelli Churn RN, CCM, [facility] RN Care Manager [phone]

## 2020-01-12 NOTE — Progress Notes (Signed)
Internal Medicine Clinic Attending  CCM services provided by the care management provider and their documentation were discussed with Dr. [name]. We reviewed the pertinent findings, urgent action items addressed by the resident and non-urgent items to be addressed by the PCP.  I agree with the assessment, diagnosis, and plan of care documented in the CCM and resident's note.  Tanecia Mccay, MD 01/12/2020 

## 2020-01-12 NOTE — Addendum Note (Signed)
Addended byDewayne Hatch on: 01/12/2020 02:04 PM   Modules accepted: Orders

## 2020-01-12 NOTE — Progress Notes (Signed)
Internal Medicine Clinic Resident  I have personally reviewed this encounter including the documentation in this note and/or discussed this patient with the care management provider. I will address any urgent items identified by the care management provider and will communicate my actions to the patient's PCP. I have reviewed the patient's CCM visit with my supervising attending, Dr Philipp Ovens.   Placed future order for lipid panel and urine microalbume.   [name], MD 01/12/2020

## 2020-01-12 NOTE — Patient Instructions (Signed)
Visit Information It was nice talking with you today. You have a clinic appointment on 6/24 at 1:45 pm.  Goals Addressed              This Visit's Progress     Patient Stated   .  Wife states " We could use some help with his blood pressure. He hasn't had his eyes checked in 3-4 years." (pt-stated)        CARE PLAN ENTRY (see longitudinal plan of care for additional care plan information)  Current Barriers:  . Chronic Disease Management support, education, and care coordination needs related to Atrial Fibrillation, CHF, CAD, HTN, and DMII- spoke with both patient and wife, patient says he checked his blood pressure this week and the reading  was"OK" but when asked for specific readings he could not provide the information,  wife says patient does not check his blood sugars, she reiterates they are dependent on their sons and/or daughter in law to provide transportation and therefore it is very hard to get into the clinic for face to face appointments, she says she never received the SCAT application in the mail  Clinical Goal(s) related to Atrial Fibrillation, CHF, CAD, HTN, and DMII:  Over the next 30-60 days, patient will:  . Work with the care management team to address educational, disease management, and care coordination needs  . Begin or continue self health monitoring activities as directed today  - work with chronic care management staff and internal medicine team to get blood pressure to target . Call provider office for new or worsened signs and symptoms Blood pressure findings outside established parameters . Call care management team with questions or concerns . Verbalize basic understanding of patient centered plan of care established today  Interventions related to Atrial Fibrillation, CHF, CAD, HTN, and DMII:  . Attempted to  assess frequency and results of home BP monitoring . Assessed medication taking behavior . Reminded patient that high blood pressure may not have  any symptoms until and heart attack or stroke occur and thus the importance of routine monitoring . Asked BSW Amber Chrismon to assess status of SCAT application . Reminded patient and wife of clinic appointment on 6/24 as they both said they were unaware  Patient Self Care Activities related to Atrial Fibrillation, CHF, CAD, HTN, and DMII:  . Patient is unable to independently self-manage chronic health conditions  Please see past updates related to this goal by clicking on the "Past Updates" button in the selected goal         The patient verbalized understanding of instructions provided today and declined a print copy of patient instruction materials.   The care management team will reach out to the patient again over the next 30 days.   Kelli Churn RN, CCM, [facility] RN Care Manager [phone]

## 2020-01-14 ENCOUNTER — Ambulatory Visit: Payer: Medicare Other | Admitting: *Deleted

## 2020-01-14 DIAGNOSIS — E113419 Type 2 diabetes mellitus with severe nonproliferative diabetic retinopathy with macular edema, unspecified eye: Secondary | ICD-10-CM

## 2020-01-14 DIAGNOSIS — E785 Hyperlipidemia, unspecified: Secondary | ICD-10-CM

## 2020-01-14 DIAGNOSIS — I10 Essential (primary) hypertension: Secondary | ICD-10-CM

## 2020-01-14 DIAGNOSIS — I63413 Cerebral infarction due to embolism of bilateral middle cerebral arteries: Secondary | ICD-10-CM

## 2020-01-14 DIAGNOSIS — N184 Chronic kidney disease, stage 4 (severe): Secondary | ICD-10-CM

## 2020-01-14 NOTE — Chronic Care Management (AMB) (Signed)
Chronic Care Management   Follow Up Note   01/14/2020 Name: Patrick [name] MRN: [id number] DOB: 1946-04-26  Referred by: Patrick Hatch, MD Reason for referral : Chronic Care Management (CKD, A Fib, HTN, HLD, DM)   Patrick [name] is a 74 y.o. year old male who is a primary care patient of Patrick [name], Patrick Rue, MD. The CCM team was consulted for assistance with chronic disease management and care coordination needs.    Review of patient status, including review of consultants reports, relevant laboratory and other test results, and collaboration with appropriate care team members and the patient's provider was performed as part of comprehensive patient evaluation and provision of chronic care management services.    SDOH (Social Determinants of Health) assessments performed: No See Care Plan activities for detailed interventions related to [facility])     Outpatient Encounter Medications as of 01/14/2020  Medication Sig  . amLODipine (NORVASC) 10 MG tablet Take 1 tablet (10 mg total) by mouth daily.  . Blood Glucose Monitoring Suppl (ONETOUCH VERIO) w/Device KIT 1 strip 4 (four) times daily by Does not apply route. Pharmacist, if not covered, provide [employer] Meter, please counsel  . carvedilol (COREG) 6.25 MG tablet Take 1 tablet (6.25 mg total) by mouth 2 (two) times daily with a meal.  . diclofenac sodium (VOLTAREN) 1 % GEL Apply 4 g topically 4 (four) times daily.  Marland Kitchen docusate sodium (COLACE) 100 MG capsule Take 100 mg by mouth 2 (two) times daily.  . furosemide (LASIX) 40 MG tablet Take 2 tablets (80 mg total) by mouth daily.  Marland Kitchen glucose blood (ONE TOUCH ULTRA TEST) test strip Use four times daily before meals and at bedtime. Diagnosis code E11.3419  . ONETOUCH DELICA LANCETS 29N MISC 1 strip 4 (four) times daily -  before meals and at bedtime by Does not apply route.  . warfarin (COUMADIN) 4 MG tablet TAKE 1 TABLET BY MOUTH ONCE DAILY EXCEPT TAKE 1 & 1/2 TABLETS EVERY TUESDAY AND  THURSDAY   No facility-administered encounter medications on file as of 01/14/2020.     Objective:  BP Readings from Last 3 Encounters:  01/04/20 (!) 148/78  12/31/19 (!) 169/106  09/10/19 (!) 190/88   Lab Results  Component Value Date   CHOL 128 11/20/2015   HDL 48 11/20/2015   LDLCALC 65 11/20/2015   TRIG 77 11/20/2015   CHOLHDL 2.7 11/20/2015   Wt Readings from Last 3 Encounters:  01/04/20 168 lb 12.8 oz (76.6 kg)  12/31/19 166 lb 8 oz (75.5 kg)  10/02/18 171 lb 9.6 oz (77.8 kg)   Lab Results  Component Value Date   HGBA1C 6.1 (A) 12/31/2019   HGBA1C 6.3 (A) 09/10/2019   HGBA1C 7.4 (A) 06/04/2018   Lab Results  Component Value Date   MICROALBUR 94.9 (H) 10/18/2014   LDLCALC 65 11/20/2015   CREATININE 2.26 (H) 12/31/2019     Lab Results  Component Value Date   CREATININE 2.26 (H) 12/31/2019   BUN 33 (H) 12/31/2019   NA 144 12/31/2019   K 4.3 12/31/2019   CL 104 12/31/2019   CO2 23 12/31/2019   Please update urine for protein, lipid panel, foot and eye exam Goals Addressed              This Visit's Progress     Patient Stated   .  Wife states " We could use some help with his blood pressure. He hasn't had his eyes checked in 3-4 years." (pt-stated)  CARE PLAN ENTRY (see longitudinal plan of care for additional care plan information)  Current Barriers:  . Chronic Disease Management support, education, and care coordination needs related to Atrial Fibrillation, CHF, CAD, HTN, and DMII- called patient's wife to advise her that Mr Handley has been certified for [employer] (formally [employer]AT)   Clinical Goal(s) related to Atrial Fibrillation, CHF, CAD, HTN, and DMII:  Over the next 30-60 days, patient will:  . Work with the care management team to address educational, disease management, and care coordination needs  . Begin or continue self health monitoring activities as directed today  - work with chronic care management staff and internal medicine team  to get blood pressure to target . Call provider office for new or worsened signs and symptoms Blood pressure findings outside established parameters . Call care management team with questions or concerns . Verbalize basic understanding of patient centered plan of care established today  Interventions related to Atrial Fibrillation, CHF, CAD, HTN, and DMII:  . Consulted with BSW Patrick [name] to assess status of SCAT application, she determined patient has been certified . Called wife to advise her that patient has received [employer] (SCAT) certification- explained the services, provided the phone number to make reservations and advised Patrick [name] reservations can be made up to 7 days  in advance but must be made at least one day prior to scheduled appointment . Reminded patient and wife of clinic appointment on 6/24 and encouraged her to call [employer] to schedule reservation  Patient Self Care Activities related to Atrial Fibrillation, CHF, CAD, HTN, and DMII:  . Patient is unable to independently self-manage chronic health conditions  Please see past updates related to this goal by clicking on the "Past Updates" button in the selected goal          Plan:   The care management team will reach out to the patient again over the next 30-60 days.    Kelli Churn RN, CCM, [facility] RN Care Manager [phone]

## 2020-01-14 NOTE — Patient Instructions (Signed)
Visit Information Patrick [name] has been certified for handicapped transportation via [place] (formally [employer]) . Please call the reservation number I gave you to arrange for his transportation to his clinic appointment on 01/21/20.  Goals Addressed              This Visit's Progress     Patient Stated   .  Wife states " We could use some help with his blood pressure. He hasn't had his eyes checked in 3-4 years." (pt-stated)        CARE PLAN ENTRY (see longitudinal plan of care for additional care plan information)  Current Barriers:  . Chronic Disease Management support, education, and care coordination needs related to Atrial Fibrillation, CHF, CAD, HTN, and DMII- called patient's wife to advise her that Patrick [name] has been certified for [employer] (formally SCAT)   Clinical Goal(s) related to Atrial Fibrillation, CHF, CAD, HTN, and DMII:  Over the next 30-60 days, patient will:  . Work with the care management team to address educational, disease management, and care coordination needs  . Begin or continue self health monitoring activities as directed today  - work with chronic care management staff and internal medicine team to get blood pressure to target . Call provider office for new or worsened signs and symptoms Blood pressure findings outside established parameters . Call care management team with questions or concerns . Verbalize basic understanding of patient centered plan of care established today  Interventions related to Atrial Fibrillation, CHF, CAD, HTN, and DMII:  . Consulted with BSW Amber Chrismon to assess status of SCAT application, she determined patient has been certified . Called wife to advise her that patient has received [employer] (SCAT) certification- explained the services, provided the phone number to make reservations and advised Mrs. Cozzens reservations can be made up to 7 days  in advance but must be made at least one day prior to scheduled  appointment . Reminded patient and wife of clinic appointment on 6/24 and encouraged her to call [employer] to schedule reservation  Patient Self Care Activities related to Atrial Fibrillation, CHF, CAD, HTN, and DMII:  . Patient is unable to independently self-manage chronic health conditions  Please see past updates related to this goal by clicking on the "Past Updates" button in the selected goal         The patient verbalized understanding of instructions provided today and declined a print copy of patient instruction materials.   The care management team will reach out to the patient again over the next 30-60 days.   Kelli Churn RN, CCM, [facility] RN Care Manager [phone]

## 2020-01-15 NOTE — Progress Notes (Signed)
Internal Medicine Clinic Attending  CCM services provided by the care management provider and their documentation were discussed with Dr. [name]. We reviewed the pertinent findings, urgent action items addressed by the resident and non-urgent items to be addressed by the PCP.  I agree with the assessment, diagnosis, and plan of care documented in the CCM and resident's note.  Arianis Bowditch Thomas Dacen Frayre, MD 01/15/2020  

## 2020-01-15 NOTE — Progress Notes (Signed)
Internal Medicine Clinic Resident  I have personally reviewed this encounter including the documentation in this note and/or discussed this patient with the care management provider. I will address any urgent items identified by the care management provider and will communicate my actions to the patient's PCP. I have reviewed the patient's CCM visit with my supervising attending, Dr Evette Doffing. Placed future order for urine micro alb, eye exam and lipid panel. Will need foot exam next visit.  [name], MD 01/15/2020

## 2020-01-15 NOTE — Addendum Note (Signed)
Addended byDewayne Hatch on: 01/15/2020 09:25 AM   Modules accepted: Orders

## 2020-01-21 ENCOUNTER — Encounter: Payer: Medicare Other | Admitting: Internal Medicine

## 2020-01-21 ENCOUNTER — Other Ambulatory Visit: Payer: Self-pay | Admitting: Internal Medicine

## 2020-01-21 DIAGNOSIS — Z7901 Long term (current) use of anticoagulants: Secondary | ICD-10-CM

## 2020-01-21 MED ORDER — WARFARIN SODIUM 4 MG PO TABS: ORAL_TABLET | ORAL | 0 refills | Status: DC

## 2020-01-21 NOTE — Progress Notes (Signed)
Warfarin refill sent with change in dose.

## 2020-01-21 NOTE — Telephone Encounter (Signed)
Per last note from Dr. Elie Confer, and due to subtherapeutic INR, Warfarin dose is supposed to be increased as below:  Take one-and-one-half (1 and 1/2) of your 4mg  strength-blue warfarin tablets on Mondays, Wednesdays and Fridays. All other days (Sundays, Tuesdays, Thursdays and Saturdays, take only one (1) tablet on these days.

## 2020-01-25 ENCOUNTER — Ambulatory Visit: Payer: Medicare Other

## 2020-01-26 ENCOUNTER — Telehealth: Payer: Self-pay | Admitting: Dietician

## 2020-01-26 ENCOUNTER — Other Ambulatory Visit: Payer: Self-pay | Admitting: Internal Medicine

## 2020-01-26 ENCOUNTER — Ambulatory Visit: Payer: Medicare Other | Admitting: Dietician

## 2020-01-26 DIAGNOSIS — E113419 Type 2 diabetes mellitus with severe nonproliferative diabetic retinopathy with macular edema, unspecified eye: Secondary | ICD-10-CM

## 2020-01-26 NOTE — Telephone Encounter (Signed)
Called to explain to patient and wife why retinal images are not going to be sent to [facility] eye doctor and ophthalmology referral requested instead. No answer. Will try again tomrrow

## 2020-01-26 NOTE — Progress Notes (Signed)
Ordered phthalmology referral to Dr. Sherlynn Stalls for severe NPDR with CSME OU.

## 2020-01-26 NOTE — Progress Notes (Signed)
Retinal images were done today per referral from Dr. Myrtie Hawk. They were not submitted as patient had severe NPDR OU and CSME OU in 2015 making him ineligible for using the retinal camera. His wife reports he is having vision problems. Recommend ophthalmology referral. He saw Dr. Sherlynn Stalls, a retinal specialist in the past.  Debera Lat, RD 01/26/2020 4:29 PM.

## 2020-01-27 ENCOUNTER — Encounter: Payer: Self-pay | Admitting: Dietician

## 2020-01-27 NOTE — Telephone Encounter (Signed)
Pt's wife returned call and lefty a voicemail. When her call was returned she could not talk. Said she'd call back later.  Informed her a letter was mailed today.

## 2020-01-27 NOTE — Telephone Encounter (Signed)
Tried calling this patient. Their voicemail box is full. I was unable to leave a message. Tried calling [name] Fiebelkorn,his wife, and was told that was the incorrect number for her. Will send letter.

## 2020-01-28 NOTE — Telephone Encounter (Signed)
Wife called asking for his last A1C value. This was discussed with her after his identity was confirmed.

## 2020-01-28 NOTE — Telephone Encounter (Signed)
Spoke with patient's wife. She states her husband does not want to see Dr. Baird Cancer.

## 2020-02-03 ENCOUNTER — Telehealth: Payer: Self-pay | Admitting: Dietician

## 2020-02-03 NOTE — Telephone Encounter (Signed)
Ms Patrick [name] calls an leaves a message that they got a message from Dr. Lynder Parents office that they did not request. They told us that they do not want to go back to him, they will pick their own retinal specialist. She would like to speak to [name].   I spoke with Patrick [name] his wife, she states that [name] will go to a different retinal specialist.

## 2020-02-09 ENCOUNTER — Telehealth: Payer: Medicare Other

## 2020-02-09 ENCOUNTER — Ambulatory Visit: Payer: Self-pay | Admitting: *Deleted

## 2020-02-09 NOTE — Chronic Care Management (AMB) (Signed)
  Chronic Care Management   Outreach Note  02/09/2020 Name: Patrick [name] MRN: [id number] DOB: 12-02-1945  Referred by: [name], MD Reason for referral : No chief complaint on file.   An unsuccessful telephone outreach was attempted today. Both home numbers were called - unable to leave message on either number. The patient was referred to the case management team for assistance with care management and care coordination.   Follow Up Plan: The care management team will reach out to the patient again over the next 7-14 days.   Kelli Churn RN, CCM, [facility] RN Care Manager [phone]

## 2020-02-15 ENCOUNTER — Other Ambulatory Visit: Payer: Self-pay | Admitting: *Deleted

## 2020-02-15 DIAGNOSIS — I10 Essential (primary) hypertension: Secondary | ICD-10-CM

## 2020-02-15 NOTE — Telephone Encounter (Signed)
Next appt scheduled 7/21.

## 2020-02-16 MED ORDER — FUROSEMIDE 40 MG PO TABS: 80.0000 mg | ORAL_TABLET | Freq: Every day | ORAL | 0 refills | Status: DC

## 2020-02-17 ENCOUNTER — Telehealth: Payer: Medicare Other

## 2020-02-22 ENCOUNTER — Telehealth: Payer: Medicare Other

## 2020-02-23 ENCOUNTER — Telehealth: Payer: Medicare Other

## 2020-02-23 ENCOUNTER — Ambulatory Visit: Payer: Medicare Other | Admitting: *Deleted

## 2020-02-23 DIAGNOSIS — E113419 Type 2 diabetes mellitus with severe nonproliferative diabetic retinopathy with macular edema, unspecified eye: Secondary | ICD-10-CM

## 2020-02-23 DIAGNOSIS — I10 Essential (primary) hypertension: Secondary | ICD-10-CM

## 2020-02-23 DIAGNOSIS — E785 Hyperlipidemia, unspecified: Secondary | ICD-10-CM

## 2020-02-23 NOTE — Patient Instructions (Signed)
Visit Information It was nice speaking with you today. Goals Addressed              This Visit's Progress     Patient Stated   .  Wife states " We could use some help with his blood pressure. He hasn't had his eyes checked in 3-4 years." (pt-stated)        CARE PLAN ENTRY (see longitudinal plan of care for additional care plan information)  Current Barriers:  . Chronic Disease Management support, education, and care coordination needs related to Atrial Fibrillation, CHF, CAD, HTN, and DMII- spoke with wife and patient, Wife states they are doing OK, refused offer to help with obtaining a new ophthalmologist, could not provider this CCM RN with any recent blood pressure readings  Clinical Goal(s) related to Atrial Fibrillation, CHF, CAD, HTN, and DMII:  Over the next 30-60 days, patient will:  . Work with the care management team to address educational, disease management, and care coordination needs  . Begin or continue self health monitoring activities as directed today  - work with chronic care management staff and internal medicine team to get blood pressure to target . Call provider office for new or worsened signs and symptoms Blood pressure findings outside established parameters . Call care management team with questions or concerns . Verbalize basic understanding of patient centered plan of care established today  Interventions related to Atrial Fibrillation, CHF, CAD, HTN, and DMII:  . Offered to help secure patient an ophthalmologist  near their residence . Appropriate assessments completed  Patient Self Care Activities related to Atrial Fibrillation, CHF, CAD, HTN, and DMII:  . Patient is unable to independently self-manage chronic health conditions  Please see past updates related to this goal by clicking on the "Past Updates" button in the selected goal         The patient verbalized understanding of instructions provided today and declined a print copy of patient  instruction materials.   The care management team will reach out to the patient again over the next 30-60 days.   Kelli Churn RN, CCM, [facility] RN Care Manager [phone]

## 2020-02-23 NOTE — Chronic Care Management (AMB) (Signed)
Chronic Care Management   Follow Up Note   02/23/2020 Name: Patrick [name] MRN: [id number] DOB: 16-Aug-1945  Referred by: [name], MD Reason for referral : Chronic Care Management (DM, HTN, CKD, CAD, PAF, CVA)   Patrick [name] is a 74 y.o. year old male who is a primary care patient of [name], Dorthula Rue, MD. The CCM team was consulted for assistance with chronic disease management and care coordination needs.    Review of patient status, including review of consultants reports, relevant laboratory and other test results, and collaboration with appropriate care team members and the patient's provider was performed as part of comprehensive patient evaluation and provision of chronic care management services.    SDOH (Social Determinants of Health) assessments performed: No See Care Plan activities for detailed interventions related to [facility])     Outpatient Encounter Medications as of 02/23/2020  Medication Sig  . amLODipine (NORVASC) 10 MG tablet Take 1 tablet (10 mg total) by mouth daily.  . Blood Glucose Monitoring Suppl (ONETOUCH VERIO) w/Device KIT 1 strip 4 (four) times daily by Does not apply route. Pharmacist, if not covered, provide [employer] Relion Meter, please counsel  . carvedilol (COREG) 6.25 MG tablet Take 1 tablet (6.25 mg total) by mouth 2 (two) times daily with a meal.  . diclofenac sodium (VOLTAREN) 1 % GEL Apply 4 g topically 4 (four) times daily.  Marland Kitchen docusate sodium (COLACE) 100 MG capsule Take 100 mg by mouth 2 (two) times daily.  . furosemide (LASIX) 40 MG tablet Take 2 tablets (80 mg total) by mouth daily.  Marland Kitchen glucose blood (ONE TOUCH ULTRA TEST) test strip Use four times daily before meals and at bedtime. Diagnosis code E11.3419  . ONETOUCH DELICA LANCETS 47W MISC 1 strip 4 (four) times daily -  before meals and at bedtime by Does not apply route.  . warfarin (COUMADIN) 4 MG tablet Take one and one half of tablets on Mondays, Wednesdays & Fridays. All other  days, take only one tablet   No facility-administered encounter medications on file as of 02/23/2020.     Objective:  BP Readings from Last 3 Encounters:  01/04/20 (!) 148/78  12/31/19 (!) 169/106  09/10/19 (!) 190/88   Wt Readings from Last 3 Encounters:  01/04/20 168 lb 12.8 oz (76.6 kg)  12/31/19 166 lb 8 oz (75.5 kg)  10/02/18 171 lb 9.6 oz (77.8 kg)   Lab Results  Component Value Date   CREATININE 2.26 (H) 12/31/2019   BUN 33 (H) 12/31/2019   NA 144 12/31/2019   K 4.3 12/31/2019   CL 104 12/31/2019   CO2 23 12/31/2019   Lab Results  Component Value Date   CHOL 128 11/20/2015   HDL 48 11/20/2015   LDLCALC 65 11/20/2015   TRIG 77 11/20/2015   CHOLHDL 2.7 11/20/2015  Consider updating lipid panel  Goals Addressed              This Visit's Progress     Patient Stated   .  Wife states " We could use some help with his blood pressure. He hasn't had his eyes checked in 3-4 years." (pt-stated)        CARE PLAN ENTRY (see longitudinal plan of care for additional care plan information)  Current Barriers:  . Chronic Disease Management support, education, and care coordination needs related to Atrial Fibrillation, CHF, CAD, HTN, and DMII- spoke with wife and patient, Wife states they are doing OK, refused offer to help with obtaining a  new ophthalmologist, could not provider this CCM RN with any recent blood pressure readings  Clinical Goal(s) related to Atrial Fibrillation, CHF, CAD, HTN, and DMII:  Over the next 30-60 days, patient will:  . Work with the care management team to address educational, disease management, and care coordination needs  . Begin or continue self health monitoring activities as directed today  - work with chronic care management staff and internal medicine team to get blood pressure to target . Call provider office for new or worsened signs and symptoms Blood pressure findings outside established parameters . Call care management team with  questions or concerns . Verbalize basic understanding of patient centered plan of care established today  Interventions related to Atrial Fibrillation, CHF, CAD, HTN, and DMII:  . Offered to help secure patient an ophthalmologist  near their residence . Appropriate assessments completed  Patient Self Care Activities related to Atrial Fibrillation, CHF, CAD, HTN, and DMII:  . Patient is unable to independently self-manage chronic health conditions  Please see past updates related to this goal by clicking on the "Past Updates" button in the selected goal          Plan:   The care management team will reach out to the patient again over the next 30-60 days.    Kelli Churn RN, CCM, [facility] RN Care Manager [phone]

## 2020-02-24 NOTE — Progress Notes (Signed)
Internal Medicine Clinic Attending  CCM services provided by the care management provider and their documentation were discussed with Dr. Marva Panda. We reviewed the pertinent findings, urgent action items addressed by the resident and non-urgent items to be addressed by the PCP.  I agree with the assessment, diagnosis, and plan of care documented in the CCM and resident's note.  Axel Filler, MD 02/24/2020

## 2020-02-24 NOTE — Progress Notes (Signed)
Internal Medicine Clinic Resident  I have personally reviewed this encounter including the documentation in this note and/or discussed this patient with the care management provider. I will address any urgent items identified by the care management provider and will communicate my actions to the patient's PCP. I have reviewed the patient's CCM visit with my supervising attending, Dr Evette Doffing.  Harvie Heck, MD  [facility] PGY-2 02/24/2020

## 2020-03-03 NOTE — Addendum Note (Signed)
Addended by: Truddie Crumble on: 03/03/2020 09:59 AM   Modules accepted: Orders

## 2020-03-10 ENCOUNTER — Telehealth: Payer: Self-pay | Admitting: *Deleted

## 2020-03-10 NOTE — Telephone Encounter (Signed)
Patient's wife calling back about prednisone. States they are having their 65 th anniversary celebration in 2 days and she needs patient to be able to walk. Hubbard Hartshorn, BSN, RN-BC

## 2020-03-10 NOTE — Telephone Encounter (Signed)
Patient's wife called in stating patient has had a 2 day hx of gout flare in left foot. Requesting prednisone for this as this relieved pain within 2 hours during last flare. Hubbard Hartshorn, BSN, RN-BC

## 2020-03-11 ENCOUNTER — Encounter: Payer: Self-pay | Admitting: Internal Medicine

## 2020-03-11 ENCOUNTER — Ambulatory Visit (INDEPENDENT_AMBULATORY_CARE_PROVIDER_SITE_OTHER): Payer: Medicare Other | Admitting: Student

## 2020-03-11 VITALS — BP 170/97 | HR 89 | Temp 99.7°F | Ht 68.0 in | Wt 165.7 lb

## 2020-03-11 DIAGNOSIS — I4821 Permanent atrial fibrillation: Secondary | ICD-10-CM

## 2020-03-11 DIAGNOSIS — M1A0720 Idiopathic chronic gout, left ankle and foot, without tophus (tophi): Secondary | ICD-10-CM

## 2020-03-11 DIAGNOSIS — I10 Essential (primary) hypertension: Secondary | ICD-10-CM | POA: Diagnosis not present

## 2020-03-11 DIAGNOSIS — Z7901 Long term (current) use of anticoagulants: Secondary | ICD-10-CM

## 2020-03-11 LAB — POCT INR: INR: 1.5 — AB (ref 2.0–3.0)

## 2020-03-11 MED ORDER — FUROSEMIDE 40 MG PO TABS: 80.0000 mg | ORAL_TABLET | Freq: Every day | ORAL | 0 refills | Status: DC

## 2020-03-11 MED ORDER — WARFARIN SODIUM 4 MG PO TABS: ORAL_TABLET | ORAL | 0 refills | Status: DC

## 2020-03-11 MED ORDER — PREDNISONE 20 MG PO TABS: 20.0000 mg | ORAL_TABLET | Freq: Two times a day (BID) | ORAL | 0 refills | Status: AC

## 2020-03-11 NOTE — Progress Notes (Signed)
   CC: foot pain  HPI:  [name] is a 74 y.o. male with history as below presenting for L foot pain which he believes is a gout flare. Please refer to problem based charting for further details of assessment and plan of current problem and chronic medical conditions.   Past Medical History:  Diagnosis Date  . Atrial fibrillation ([place])   . CAD (coronary artery disease)   . Chronic anticoagulation, on coumadin 07/17/2013  . CKD (chronic kidney disease), stage II   . 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 Depression   . Diabetes mellitus type II    Insulin dependent  . GERD (gastroesophageal reflux disease)   . Gout   . Hyperlipidemia   . Hypertension   . Myocardial infarction [facility]) 2000's   "near heart attack" (07/14/2013)  . Paroxysmal atrial fibrillation (HCC)    on coumadin  . Permanent atrial fibrillation ([place]) 07/17/2013  . Shortness of breath    "can happen at any time" (07/14/2013)  . Sleep apnea 07/2010   "has mask at home; seldom uses it" (07/14/2013)  . Stomach ulcer 1950's   "as a teenager"  . Stroke [facility]) ~ 2007; ~ 2009   "memory not as good since" (07/14/2013)  . Tachy-brady syndrome ([place]) 07/17/2013  . Upper GI bleeding 07/01/2013   Review of Systems:   Review of Systems  Constitutional: Negative for chills, diaphoresis and fever.  Respiratory: Negative for cough and shortness of breath.   Cardiovascular: Negative for chest pain and palpitations.  Gastrointestinal: Negative for abdominal pain, constipation, nausea and vomiting.  Musculoskeletal: Positive for joint pain.  Neurological:       No confusion     Physical Exam: There were no vitals filed for this visit. Constitutional: no acute distress, thin Head: atraumatic ENT: external ears normal, decreased hearing Cardiovascular: regular rate and rhythm, normal heart sounds Pulmonary: effort normal, normal breath sounds bilaterally Abdominal:  flat, nontender, no rebound tenderness, bowel sounds normal Musculoskeletal: L foot tarsal-metatarsal area with tenderness, erythema, mild warmth compared to other side, mild swelling, exquisite tenderness with movement of the joint Skin: warm and dry Neurological: alert, no focal deficit Psychiatric: normal mood and affect  Assessment & Plan:   See Encounters Tab for problem based charting.  Patient seen with Dr. Rebeca Alert

## 2020-03-11 NOTE — Patient Instructions (Addendum)
Thank you for allowing [name] to be a part of your care today, it was pleasure seeing you. We discussed your gout and warfarin use.  I am checking these labs: INR  Your INR was low 1.5. Please take 1.5 tablets Monday Wednesday and Friday, and 1 tablet on the other days. I will reach out to the coumadin clinic to schedule you for follow up.  I am also starting you on prednisone for your gout. Take 1 tablet twice a day on the first 5 days. On days 6 and 7, take 1 tablet in the morning and 0.5 at night. On days 8 and 9, take 1 tablet in the morning. On days 10 and 11, take 0.5 tablets in the morning.   Thank you, and please call the Internal Medicine Clinic at [phone] if you have any questions.  Best, Dr. Bridgett Larsson

## 2020-03-11 NOTE — Assessment & Plan Note (Addendum)
INR of 1.5 today. Last INR of 1.4 in June. F/u appointment with anticoagulation clinic was canceled, unsure if it was ever rescheduled. Has been taking his warfarin 4mg  1 tablet daily, rather than 1.5 on MWF.   -counseled on appropriate dosing -will reschedule his follow up with anticoagulation clinic

## 2020-03-11 NOTE — Assessment & Plan Note (Signed)
He reports pain in his L foot with acute onset 4 days ago. States this feels similar to prior gout flares. It is located over his L tarsal-metatarsal area. Tender, mildly red, mildly swollen, exquisite pain with movement of the joint. Denies any trauma. No signs of infection including fever, chills, confusion, cough, SOB, dysuria. States that previous gout flares have responded well to prednisone. Was on allopurinol previously, but was discontinued due to renal impairment. Last uric acid was 6.2 in 2016.   -start prednisone 20mg  BID with taper -Xray offered but patient declined

## 2020-03-11 NOTE — Telephone Encounter (Signed)
Appt made for today at 2:15 with PCP. Hubbard Hartshorn, BSN, RN-BC

## 2020-03-11 NOTE — Telephone Encounter (Signed)
Please call pt back regarding gout med.

## 2020-03-14 ENCOUNTER — Ambulatory Visit (INDEPENDENT_AMBULATORY_CARE_PROVIDER_SITE_OTHER): Payer: Medicare Other | Admitting: Pharmacist

## 2020-03-14 DIAGNOSIS — I63219 Cerebral infarction due to unspecified occlusion or stenosis of unspecified vertebral arteries: Secondary | ICD-10-CM

## 2020-03-14 DIAGNOSIS — I6381 Other cerebral infarction due to occlusion or stenosis of small artery: Secondary | ICD-10-CM

## 2020-03-14 DIAGNOSIS — Z7901 Long term (current) use of anticoagulants: Secondary | ICD-10-CM | POA: Diagnosis not present

## 2020-03-14 DIAGNOSIS — I4821 Permanent atrial fibrillation: Secondary | ICD-10-CM

## 2020-03-14 LAB — POCT INR: INR: 1.8 — AB (ref 2.0–3.0)

## 2020-03-14 NOTE — Progress Notes (Signed)
Anticoagulation Management Patrick [name] is a 74 y.o. male who reports to the clinic for monitoring of warfarin treatment.    Indication: atrial fibrillation, paroxsymal chronic; long term current use of warfarin to maintain INR 2.0 - 3.0  Duration: indefinite Supervising physician: Joni Reining  Anticoagulation Clinic Visit History: Patient does not report signs/symptoms of bleeding or thromboembolism  Other recent changes: No diet, medications, lifestyle changes EXCEPT AS NOTED IN PATIENT FINDINGS.  Anticoagulation Episode Summary    Current INR goal:  2.0-3.0  TTR:  43.7 % (6.5 y)  Next INR check:  04/11/2020  INR from last check:  1.8 (03/14/2020)  Weekly max warfarin dose:    Target end date:  Indefinite  INR check location:  Anticoagulation Clinic  Preferred lab:    Send INR reminders to:     Indications   ATRIAL FIBRILLATION PAROXYSMAL CHRONIC (Resolved) [I48.91] Acute ischemic VBA thalamic stroke (HCC) [H21.224 I63.22] Chronic anticoagulation on coumadin [Z79.01]       Comments:          No Known Allergies  Current Outpatient Medications:  .  amLODipine (NORVASC) 10 MG tablet, Take 1 tablet (10 mg total) by mouth daily., Disp: 90 tablet, Rfl: 5 .  Blood Glucose Monitoring Suppl (ONETOUCH VERIO) w/Device KIT, 1 strip 4 (four) times daily by Does not apply route. Pharmacist, if not covered, provide [employer] Rel[employer] Meter, please counsel, Disp: 1 kit, Rfl: 0 .  carvedilol (COREG) 6.25 MG tablet, Take 1 tablet (6.25 mg total) by mouth 2 (two) times daily with a meal., Disp: 60 tablet, Rfl: 3 .  diclofenac sodium (VOLTAREN) 1 % GEL, Apply 4 g topically 4 (four) times daily., Disp: 1 Tube, Rfl: 3 .  docusate sodium (COLACE) 100 MG capsule, Take 100 mg by mouth 2 (two) times daily., Disp: , Rfl:  .  furosemide (LASIX) 40 MG tablet, Take 2 tablets (80 mg total) by mouth daily., Disp: 90 tablet, Rfl: 0 .  glucose blood (ONE TOUCH ULTRA TEST) test strip, Use four times daily  before meals and at bedtime. Diagnosis code E11.3419, Disp: 100 each, Rfl: 12 .  ONETOUCH DELICA LANCETS 82N MISC, 1 strip 4 (four) times daily -  before meals and at bedtime by Does not apply route., Disp: 100 each, Rfl: 11 .  predniSONE (DELTASONE) 20 MG tablet, Take 1 tablet (20 mg total) by mouth in the morning and at bedtime for 5 days. On day 6 and 7, take 1 tablet in the morning and 0.5 tablets in the evening. On day 8 and 9, take 1 tablet in the morning. On day 10 and 11, take 0.5 tablets in the morning, Disp: 16 tablet, Rfl: 0 .  warfarin (COUMADIN) 4 MG tablet, Take one and one half of tablets on Mondays, Wednesdays & Fridays. All other days, take only one tablet, Disp: 50 tablet, Rfl: 0 Past Medical History:  Diagnosis Date  . Atrial fibrillation ([place])   . CAD (coronary artery disease)   . Chronic anticoagulation, on coumadin 07/17/2013  . CKD (chronic kidney disease), stage II   . 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 Depression   . Diabetes mellitus type II    Insulin dependent  . GERD (gastroesophageal reflux disease)   . Gout   . Hyperlipidemia   . Hypertension   . Myocardial infarction [facility]) 2000's   "near heart attack" (07/14/2013)  . Paroxysmal atrial fibrillation (HCC)    on coumadin  .  Permanent atrial fibrillation ([place]) 07/17/2013  . Shortness of breath    "can happen at any time" (07/14/2013)  . Sleep apnea 07/2010   "has mask at home; seldom uses it" (07/14/2013)  . Stomach ulcer 1950's   "as a teenager"  . Stroke [facility]) ~ 2007; ~ 2009   "memory not as good since" (07/14/2013)  . Tachy-brady syndrome ([place]) 07/17/2013  . Upper GI bleeding 07/01/2013   Social History   Socioeconomic History  . Marital status: Married    Spouse name: Not on file  . Number of children: Not on file  . Years of education: 5  . Highest education level: Not on file  Occupational History  . Occupation: retired Freight forwarder: UNEMPLOYED  Tobacco Use  . Smoking status: Former Smoker    Packs/day: 0.50    Years: 15.00    Pack years: 7.50    Types: Cigarettes    Quit date: 10/26/1976    Years since quitting: 43.4  . Smokeless tobacco: Former Systems developer  . Tobacco comment: 07/14/2013 "quit chewing and dipping in ~ 1970"  Substance and Sexual Activity  . Alcohol use: No    Alcohol/week: 0.0 standard drinks    Comment: 07/14/2013 "drank a little; quit in ~ 1970; never had problem w/it"  . Drug use: No  . Sexual activity: Not Currently  Other Topics Concern  . Not on file  Social History Narrative   Former smoker - stopped 1978   Alcohol stopped 1970   Full Code      Current Social History 10/07/2019        Patient lives with spouse [name]) and 1 son in a two level home.       Patient's method of transportation is via family member (Son or daughter-in-law).      The highest level of education was 5 th grade.      The patient currently retired Engineer, manufacturing systems.      Identified important Relationships are Wife, sons, and grandchildren       Pets : None       Interests / Fun: Watch TV       Current Stressors: No bedroom downstairs. Has to climb 12 steps to bedroom.       Religious / Personal Beliefs: Christian       L. [place], BSN, RN-BC       Social Determinants of Health   Financial Resource Strain:   . Difficulty of Paying Living Expenses:   Food Insecurity:   . Worried About Charity fundraiser in the Last Year:   . Arboriculturist in the Last Year:   Transportation Needs: Unmet Transportation Needs  . Lack of Transportation (Medical): Yes  . Lack of Transportation (Non-Medical): Yes  Physical Activity: Inactive  . Days of Exercise per Week: 0 days  . Minutes of Exercise per Session: 0 min  Stress:   . Feeling of Stress :   Social Connections: Unknown  . Frequency of Communication with Friends and Family: More than three times a week  . Frequency of Social Gatherings with  Friends and Family: Not on file  . Attends Religious Services: More than 4 times per year  . Active Member of Clubs or Organizations: Not on file  . Attends Archivist Meetings: Not on file  . Marital Status: Married   Family History  Problem Relation Age of Onset  . Diabetes Mother   . Hypertension Mother   .  Heart Problems Father   . Hypertension Sister   . Diabetes Sister   . Diabetes Sister   . Diabetes Sister   . Cancer Brother     ASSESSMENT Recent Results: The most recent result is correlated with 30 mg per week: Lab Results  Component Value Date   INR 1.8 (A) 03/14/2020   INR 1.5 (A) 03/11/2020   INR 1.4 (A) 01/04/2020    Anticoagulation Dosing: Description   Take one-and-one-half (1 and 1/2) of your 63m strength-blue warfarin tablets on Mondays, Wednesdays and Fridays. All other days (Sundays, Tuesdays, Thursdays and Saturdays, take only one (1) tablet on these days.      INR today: Subtherapeutic  PLAN Weekly dose was increased by 12% to 34 mg per week  Patient Instructions  Patient instructed to take medications as defined in the Anti-coagulation Track section of this encounter.  Patient instructed to take today's dose.  Patient instructed to take one-and-one-half (1 and 1/2) of your 434mstrength-blue warfarin tablets on Mondays, Wednesdays and Fridays. All other days (Sundays, Tuesdays, Thursdays and Saturdays, take only one (1) tablet on these days.  Patient verbalized understanding of these instructions.    Patient advised to contact clinic or seek medical attention if signs/symptoms of bleeding or thromboembolism occur.  Patient verbalized understanding by repeating back information and was advised to contact me if further medication-related questions arise. Patient was also provided an information handout.  Follow-up Return in 4 weeks (on 04/11/2020) for Follow up INR.  JaPennie BanterPharmD, CPP  15 minutes spent face-to-face with the  patient during the encounter. 50% of time spent on education, including signs/sx bleeding and clotting, as well as food and drug interactions with warfarin. 50% of time was spent on fingerprick POC INR sample collection,processing, results determination, and documentation in [email or web address]

## 2020-03-14 NOTE — Patient Instructions (Signed)
Patient instructed to take medications as defined in the Anti-coagulation Track section of this encounter.  Patient instructed to take today's dose.  Patient instructed to take one-and-one-half (1 and 1/2) of your 4mg  strength-blue warfarin tablets on Mondays, Wednesdays and Fridays. All other days (Sundays, Tuesdays, Thursdays and Saturdays, take only one (1) tablet on these days.  Patient verbalized understanding of these instructions.

## 2020-03-16 NOTE — Progress Notes (Signed)
Internal Medicine Clinic Attending  I saw and evaluated the patient.  I personally confirmed the key portions of the history and exam documented by Dr. Bridgett Larsson and I reviewed pertinent patient test results.  The assessment, diagnosis, and plan were formulated together and I agree with the documentation in the resident's note.  Lenice Pressman, M.D., Ph.D.

## 2020-03-25 ENCOUNTER — Telehealth: Payer: Medicare Other

## 2020-03-29 ENCOUNTER — Ambulatory Visit: Payer: Medicare Other | Admitting: *Deleted

## 2020-03-29 DIAGNOSIS — E113419 Type 2 diabetes mellitus with severe nonproliferative diabetic retinopathy with macular edema, unspecified eye: Secondary | ICD-10-CM

## 2020-03-29 DIAGNOSIS — I6381 Other cerebral infarction due to occlusion or stenosis of small artery: Secondary | ICD-10-CM

## 2020-03-29 DIAGNOSIS — I4821 Permanent atrial fibrillation: Secondary | ICD-10-CM

## 2020-03-29 NOTE — Chronic Care Management (AMB) (Signed)
Chronic Care Management   Follow Up Note   03/29/2020 Name: Patrick [name] MRN: [id number] DOB: 26-Aug-1945  Referred by: [name], MD Reason for referral : Chronic Care Management (HF, HTN, NIDDM, CAD, PAF, CKD)   Patrick [name] is a 74 y.o. year old male who is a primary care patient of [name], Dorthula Rue, MD. The CCM team was consulted for assistance with chronic disease management and care coordination needs.    Review of patient status, including review of consultants reports, relevant laboratory and other test results, and collaboration with appropriate care team members and the patient's provider was performed as part of comprehensive patient evaluation and provision of chronic care management services.    SDOH (Social Determinants of Health) assessments performed: No See Care Plan activities for detailed interventions related to [facility])     Outpatient Encounter Medications as of 03/29/2020  Medication Sig  . amLODipine (NORVASC) 10 MG tablet Take 1 tablet (10 mg total) by mouth daily.  . Blood Glucose Monitoring Suppl (ONETOUCH VERIO) w/Device KIT 1 strip 4 (four) times daily by Does not apply route. Pharmacist, if not covered, provide [employer] Meter, please counsel  . carvedilol (COREG) 6.25 MG tablet Take 1 tablet (6.25 mg total) by mouth 2 (two) times daily with a meal.  . diclofenac sodium (VOLTAREN) 1 % GEL Apply 4 g topically 4 (four) times daily.  Marland Kitchen docusate sodium (COLACE) 100 MG capsule Take 100 mg by mouth 2 (two) times daily.  . furosemide (LASIX) 40 MG tablet Take 2 tablets (80 mg total) by mouth daily.  Marland Kitchen glucose blood (ONE TOUCH ULTRA TEST) test strip Use four times daily before meals and at bedtime. Diagnosis code E11.3419  . ONETOUCH DELICA LANCETS 15Q MISC 1 strip 4 (four) times daily -  before meals and at bedtime by Does not apply route.  . warfarin (COUMADIN) 4 MG tablet Take one and one half of tablets on Mondays, Wednesdays & Fridays. All other  days, take only one tablet   No facility-administered encounter medications on file as of 03/29/2020.     Objective:  Lab Results  Component Value Date   MICROALBUR 94.9 (H) 10/18/2014   MICROALBUR 37.60 (H) 10/27/2010  Urine for protein update suggested    Goals Addressed              This Visit's Progress     Patient Stated   .  Wife states " We could use some help with his blood pressure. He hasn't had his eyes checked in 3-4 years." (pt-stated)        CARE PLAN ENTRY (see longitudinal plan of care for additional care plan information)  Current Barriers:  . Chronic Disease Management support, education, and care coordination needs related to Atrial Fibrillation, CHF, CAD, HTN, and DMII- spoke with wife, she says they are doing OK, says patient made an appointment to have his eyes checked, says the prednisone helped his gout, she could not provider this CCM RN with any recent blood pressure readings as she says the home monitor cuff is "acting up so he got frustrated and didn't try to take his blood pressure anymore"   Clinical Goal(s) related to Atrial Fibrillation, CHF, CAD, HTN, and DMII:  Over the next 30-60 days, patient will:  . Work with the care management team to address educational, disease management, and care coordination needs  . Begin or continue self health monitoring activities as directed today  - work with chronic care management staff and  internal medicine team to get blood pressure to target . Call provider office for new or worsened signs and symptoms Blood pressure findings outside established parameters . Call care management team with questions or concerns . Verbalize basic understanding of patient centered plan of care established today  Interventions related to Atrial Fibrillation, CHF, CAD, HTN, and DMII:  . Appropriate assessments completed . Advised wife to ask patient to bring home BP monitor to the clinic and staff or this CCM RN will review  patient's technique and how to troubleshoot . Discussed plans with patient for ongoing care management follow up and ensured patient has contact number for CCM RN   Patient Self Care Activities related to Atrial Fibrillation, CHF, CAD, HTN, and DMII:  . Patient is unable to independently self-manage chronic health conditions  Please see past updates related to this goal by clicking on the "Past Updates" button in the selected goal        Other   .  Blood Pressure < 140/90        BP Readings from Last 3 Encounters:  03/11/20 (!) 170/97  01/04/20 (!) 148/78  12/31/19 (!) 169/106   Not meeting blood pressure targets    .  HEMOGLOBIN A1C < 7        Lab Results  Component Value Date   HGBA1C 6.1 (A) 12/31/2019   Meeting Hgb A1C target    .  LDL CALC < 70        Lab Results  Component Value Date   CHOL 128 11/20/2015   HDL 48 11/20/2015   LDLCALC 65 11/20/2015   TRIG 77 11/20/2015   CHOLHDL 2.7 11/20/2015   Please update lipid profile  Discussed importance of annual lipid profile and target for LDL in light of CAD and previous stroke        Plan:   The care management team will reach out to the patient again over the next 30-60 days.    Kelli Churn RN, CCM, [facility] RN Care Manager [phone]

## 2020-03-29 NOTE — Progress Notes (Signed)
Internal Medicine Clinic Resident  I have personally reviewed this encounter including the documentation in this note and/or discussed this patient with the care management provider. I will address any urgent items identified by the care management provider and will communicate my actions to the patient's PCP. Patient needs updated lipid panel and urine microalbumin/Cr ratio, placed future orders.I have reviewed the patient's CCM visit with my supervising attending, Dr Rebeca Alert.  Asencion Noble, MD 03/29/2020

## 2020-03-29 NOTE — Patient Instructions (Signed)
Visit Information It was nice speaking with you today. I'm glad your gout is no longer bothering you. Goals Addressed              This Visit's Progress     Patient Stated   .  Wife states " We could use some help with his blood pressure. He hasn't had his eyes checked in 3-4 years." (pt-stated)        CARE PLAN ENTRY (see longitudinal plan of care for additional care plan information)  Current Barriers:  . Chronic Disease Management support, education, and care coordination needs related to Atrial Fibrillation, CHF, CAD, HTN, and DMII- spoke with wife, she says they are doing OK, says patient made an appointment to have his eyes checked, says the prednisone helped his gout, she could not provider this CCM RN with any recent blood pressure readings as she says the home monitor cuff is "acting up so he got frustrated and didn't try to take his blood pressure anymore"   Clinical Goal(s) related to Atrial Fibrillation, CHF, CAD, HTN, and DMII:  Over the next 30-60 days, patient will:  . Work with the care management team to address educational, disease management, and care coordination needs  . Begin or continue self health monitoring activities as directed today  - work with chronic care management staff and internal medicine team to get blood pressure to target . Call provider office for new or worsened signs and symptoms Blood pressure findings outside established parameters . Call care management team with questions or concerns . Verbalize basic understanding of patient centered plan of care established today  Interventions related to Atrial Fibrillation, CHF, CAD, HTN, and DMII:  . Appropriate assessments completed . Advised wife to ask patient to bring home BP monitor to the clinic and staff or this CCM RN will review patient's technique and how to troubleshoot . Discussed plans with patient for ongoing care management follow up and ensured patient has contact number for CCM  RN   Patient Self Care Activities related to Atrial Fibrillation, CHF, CAD, HTN, and DMII:  . Patient is unable to independently self-manage chronic health conditions  Please see past updates related to this goal by clicking on the "Past Updates" button in the selected goal        Other   .  Blood Pressure < 140/90        BP Readings from Last 3 Encounters:  03/11/20 (!) 170/97  01/04/20 (!) 148/78  12/31/19 (!) 169/106   Not meeting blood pressure targets    .  HEMOGLOBIN A1C < 7        Lab Results  Component Value Date   HGBA1C 6.1 (A) 12/31/2019   Meeting Hgb A1C target    .  LDL CALC < 70        Lab Results  Component Value Date   CHOL 128 11/20/2015   HDL 48 11/20/2015   LDLCALC 65 11/20/2015   TRIG 77 11/20/2015   CHOLHDL 2.7 11/20/2015     Discussed importance of annual lipid profile and target for LDL in light of CAD and previous stroke       The patient's wife  verbalized understanding of instructions provided today and declined a print copy of patient instruction materials.   The care management team will reach out to the patient again over the next 30-60 days.   Kelli Churn RN, CCM, [facility] RN Care Manager [phone]

## 2020-03-29 NOTE — Addendum Note (Signed)
Addended by: Asencion Noble on: 03/29/2020 05:04 PM   Modules accepted: Orders

## 2020-03-30 NOTE — Progress Notes (Signed)
Internal Medicine Clinic Attending  CCM services provided by the care management provider and their documentation were reviewed with Dr. Sherry Ruffing.  We reviewed the pertinent findings, urgent action items addressed by the resident and non-urgent items to be addressed by the PCP.  I agree with the assessment, diagnosis, and plan of care documented in the CCM and resident's note.  Oda Kilts, MD 03/30/2020

## 2020-04-11 ENCOUNTER — Ambulatory Visit: Payer: Medicare Other

## 2020-04-11 ENCOUNTER — Other Ambulatory Visit: Payer: Self-pay

## 2020-04-11 ENCOUNTER — Ambulatory Visit (INDEPENDENT_AMBULATORY_CARE_PROVIDER_SITE_OTHER): Payer: Medicare Other | Admitting: Pharmacist

## 2020-04-11 DIAGNOSIS — I6322 Cerebral infarction due to unspecified occlusion or stenosis of basilar arteries: Secondary | ICD-10-CM | POA: Diagnosis not present

## 2020-04-11 DIAGNOSIS — Z7901 Long term (current) use of anticoagulants: Secondary | ICD-10-CM

## 2020-04-11 DIAGNOSIS — I6381 Other cerebral infarction due to occlusion or stenosis of small artery: Secondary | ICD-10-CM

## 2020-04-11 LAB — POCT INR: INR: 2 (ref 2.0–3.0)

## 2020-04-11 NOTE — Progress Notes (Signed)
INTERNAL MEDICINE TEACHING ATTENDING ADDENDUM ° °I agree with pharmacy recommendations as outlined in their note.  ° °-Masayoshi Couzens MD ° °

## 2020-04-11 NOTE — Patient Instructions (Signed)
Patient instructed to take medications as defined in the Anti-coagulation Track section of this encounter.  Patient instructed to take today's dose.  Patient instructed to take one-and-one-half (1 and 1/2) of your 4mg  strength-blue warfarin tablets on Mondays, Tuesdays, Wednesdays, Thursdays and Fridays. All other days (Saturdays and Sundays), take only one (1) tablet on these days.  Patient verbalized understanding of these instructions.

## 2020-04-11 NOTE — Progress Notes (Signed)
Anticoagulation Management Patrick [name] is a 74 y.o. male who reports to the clinic for monitoring of warfarin treatment.    Indication: atrial fibrillation ; long term current use of anticoagulant.   Duration: indefinite Supervising physician: Lalla Brothers  Anticoagulation Clinic Visit History: Patient does not report signs/symptoms of bleeding or thromboembolism  Other recent changes: No diet, medications, lifestyle changes.  Anticoagulation Episode Summary    Current INR goal:  2.0-3.0  TTR:  43.2 % (6.6 y)  Next INR check:  05/09/2020  INR from last check:  2.0 (04/11/2020)  Weekly max warfarin dose:    Target end date:  Indefinite  INR check location:  Anticoagulation Clinic  Preferred lab:    Send INR reminders to:     Indications   ATRIAL FIBRILLATION PAROXYSMAL CHRONIC (Resolved) [I48.91] Acute ischemic VBA thalamic stroke (HCC) [J62.836 I63.22] Chronic anticoagulation on coumadin [Z79.01]       Comments:          No Known Allergies  Current Outpatient Medications:  .  amLODipine (NORVASC) 10 MG tablet, Take 1 tablet (10 mg total) by mouth daily., Disp: 90 tablet, Rfl: 5 .  Blood Glucose Monitoring Suppl (ONETOUCH VERIO) w/Device KIT, 1 strip 4 (four) times daily by Does not apply route. Pharmacist, if not covered, provide [employer] Meter, please counsel, Disp: 1 kit, Rfl: 0 .  carvedilol (COREG) 6.25 MG tablet, Take 1 tablet (6.25 mg total) by mouth 2 (two) times daily with a meal., Disp: 60 tablet, Rfl: 3 .  diclofenac sodium (VOLTAREN) 1 % GEL, Apply 4 g topically 4 (four) times daily., Disp: 1 Tube, Rfl: 3 .  docusate sodium (COLACE) 100 MG capsule, Take 100 mg by mouth 2 (two) times daily., Disp: , Rfl:  .  furosemide (LASIX) 40 MG tablet, Take 2 tablets (80 mg total) by mouth daily., Disp: 90 tablet, Rfl: 0 .  glucose blood (ONE TOUCH ULTRA TEST) test strip, Use four times daily before meals and at bedtime. Diagnosis code E11.3419, Disp: 100 each,  Rfl: 12 .  ONETOUCH DELICA LANCETS 62H MISC, 1 strip 4 (four) times daily -  before meals and at bedtime by Does not apply route., Disp: 100 each, Rfl: 11 .  warfarin (COUMADIN) 4 MG tablet, Take one and one half of tablets on Mondays, Wednesdays & Fridays. All other days, take only one tablet, Disp: 50 tablet, Rfl: 0 Past Medical History:  Diagnosis Date  . Atrial fibrillation ([place])   . CAD (coronary artery disease)   . Chronic anticoagulation, on coumadin 07/17/2013  . CKD (chronic kidney disease), stage II   . 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 Depression   . Diabetes mellitus type II    Insulin dependent  . GERD (gastroesophageal reflux disease)   . Gout   . Hyperlipidemia   . Hypertension   . Myocardial infarction [facility]) 2000's   "near heart attack" (07/14/2013)  . Paroxysmal atrial fibrillation (HCC)    on coumadin  . Permanent atrial fibrillation ([place]) 07/17/2013  . Shortness of breath    "can happen at any time" (07/14/2013)  . Sleep apnea 07/2010   "has mask at home; seldom uses it" (07/14/2013)  . Stomach ulcer 1950's   "as a teenager"  . Stroke [facility]) ~ 2007; ~ 2009   "memory not as good since" (07/14/2013)  . Tachy-brady syndrome ([place]) 07/17/2013  . Upper GI bleeding 07/01/2013   Social History  Socioeconomic History  . Marital status: Married    Spouse name: Not on file  . Number of children: Not on file  . Years of education: 5  . Highest education level: Not on file  Occupational History  . 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 Smoking status: Former Smoker    Packs/day: 0.50    Years: 15.00    Pack years: 7.50    Types: Cigarettes    Quit date: 10/26/1976    Years since quitting: 43.4  . Smokeless tobacco: Former Systems developer  . Tobacco comment: 07/14/2013 "quit chewing and dipping in ~ 1970"  Substance and Sexual Activity  . Alcohol use: No    Alcohol/week: 0.0 standard  drinks    Comment: 07/14/2013 "drank a little; quit in ~ 1970; never had problem w/it"  . Drug use: No  . Sexual activity: Not Currently  Other Topics Concern  . Not on file  Social History Narrative   Former smoker - stopped 1978   Alcohol stopped 1970   Full Code      Current Social History 10/07/2019        Patient lives with spouse [name]) and 1 son in a two level home.       Patient's method of transportation is via family member (Son or daughter-in-law).      The highest level of education was 5 th grade.      The patient currently retired Engineer, manufacturing systems.      Identified important Relationships are Wife, sons, and grandchildren       Pets : None       Interests / Fun: Watch TV       Current Stressors: No bedroom downstairs. Has to climb 12 steps to bedroom.       Religious / Personal Beliefs: Christian       L. [place], BSN, RN-BC       Social Determinants of Health   Financial Resource Strain:   . Difficulty of Paying Living Expenses: Not on file  Food Insecurity:   . Worried About Charity fundraiser in the Last Year: Not on file  . Ran Out of Food in the Last Year: Not on file  Transportation Needs: Unmet Transportation Needs  . Lack of Transportation (Medical): Yes  . Lack of Transportation (Non-Medical): Yes  Physical Activity: Inactive  . Days of Exercise per Week: 0 days  . Minutes of Exercise per Session: 0 min  Stress:   . Feeling of Stress : Not on file  Social Connections: Unknown  . Frequency of Communication with Friends and Family: More than three times a week  . Frequency of Social Gatherings with Friends and Family: Not on file  . Attends Religious Services: More than 4 times per year  . Active Member of Clubs or Organizations: Not on file  . Attends Archivist Meetings: Not on file  . Marital Status: Married   Family History  Problem Relation Age of Onset  . Diabetes Mother   . Hypertension Mother   . Heart Problems Father    . Hypertension Sister   . Diabetes Sister   . Diabetes Sister   . Diabetes Sister   . Cancer Brother     ASSESSMENT Recent Results: The most recent result is correlated with 34 mg per week: Lab Results  Component Value Date   INR 2.0 04/11/2020   INR 1.8 (A) 03/14/2020   INR 1.5 (A)  03/11/2020    Anticoagulation Dosing: Description   Take one-and-one-half (1 and 1/2) of your 51m strength-blue warfarin tablets on Mondays, Tuesdays, Wednesdays, Thursdays and Fridays. All other days (Saturdays and Sundays), take only one (1) tablet on these days.      INR today: Therapeutic  PLAN Weekly dose was increased by 12% to 38 mg per week  Patient Instructions  Patient instructed to take medications as defined in the Anti-coagulation Track section of this encounter.  Patient instructed to take today's dose.  Patient instructed to take one-and-one-half (1 and 1/2) of your 447mstrength-blue warfarin tablets on Mondays, Tuesdays, Wednesdays, Thursdays and Fridays. All other days (Saturdays and Sundays), take only one (1) tablet on these days.  Patient verbalized understanding of these instructions.    Patient advised to contact clinic or seek medical attention if signs/symptoms of bleeding or thromboembolism occur.  Patient verbalized understanding by repeating back information and was advised to contact me if further medication-related questions arise. Patient was also provided an information handout.  Follow-up Return in 4 weeks (on 05/09/2020) for Follow up INR.  JaPennie BanterPharmD, CPP  15 minutes spent face-to-face with the patient during the encounter. 50% of time spent on education, including signs/sx bleeding and clotting, as well as food and drug interactions with warfarin. 50% of time was spent on fingerprick POC INR sample collection,processing, results determination, and documentation in [email or web address]

## 2020-04-26 ENCOUNTER — Telehealth: Payer: Self-pay | Admitting: *Deleted

## 2020-04-26 NOTE — Telephone Encounter (Signed)
Patient called in c/o gout flare in both feet R > L. States right one started 2 days ago and left one started yesterday. Unable to rate on scale of 1-10 but wife states it's so painful he cannot walk. Requesting refill on Prednisone at [facility] on [address]. Patient was last seen on 03/11/2020 for same reason. Hubbard Hartshorn, BSN, RN-BC

## 2020-04-27 MED ORDER — PREDNISONE 20 MG PO TABS: 40.0000 mg | ORAL_TABLET | Freq: Every day | ORAL | 0 refills | Status: AC

## 2020-04-27 NOTE — Telephone Encounter (Signed)
Hi there, we will have to evaluate the patient in person again for this. Can he come in tomorrow?

## 2020-04-27 NOTE — Telephone Encounter (Signed)
Returned call to patient. States he is unable to come to clinic as he can hardly walk. He is unable to get down his steps 2/2 the pain in his feet. He understands he will be unable to receive prednisone without being seen. Hubbard Hartshorn, BSN, RN-BC

## 2020-04-27 NOTE — Telephone Encounter (Signed)
Patient calling back regarding request for prednisone for gout flare. Hubbard Hartshorn, BSN, RN-BC

## 2020-04-28 ENCOUNTER — Ambulatory Visit: Payer: Medicare Other | Admitting: *Deleted

## 2020-04-28 DIAGNOSIS — M1A0720 Idiopathic chronic gout, left ankle and foot, without tophus (tophi): Secondary | ICD-10-CM

## 2020-04-28 DIAGNOSIS — E113419 Type 2 diabetes mellitus with severe nonproliferative diabetic retinopathy with macular edema, unspecified eye: Secondary | ICD-10-CM

## 2020-04-28 DIAGNOSIS — I10 Essential (primary) hypertension: Secondary | ICD-10-CM

## 2020-04-28 DIAGNOSIS — I6381 Other cerebral infarction due to occlusion or stenosis of small artery: Secondary | ICD-10-CM

## 2020-04-28 NOTE — Chronic Care Management (AMB) (Signed)
Chronic Care Management   Follow Up Note   04/28/2020 Name: Jibreel Fedewa MRN: [id number] DOB: October 20, 1945  Referred by: [name], MD Reason for referral : Chronic Care Management (HF, HTN, CKD, CAD/MI, PAF, Gout, previous CVA)   Ahmani Prehn is a 74 y.o. year old male who is a primary care patient of [name], Dorthula Rue, MD. The CCM team was consulted for assistance with chronic disease management and care coordination needs.    Review of patient status, including review of consultants reports, relevant laboratory and other test results, and collaboration with appropriate care team members and the patient's provider was performed as part of comprehensive patient evaluation and provision of chronic care management services.    SDOH (Social Determinants of Health) assessments performed: No See Care Plan activities for detailed interventions related to [facility])     Outpatient Encounter Medications as of 04/28/2020  Medication Sig  . amLODipine (NORVASC) 10 MG tablet Take 1 tablet (10 mg total) by mouth daily.  . Blood Glucose Monitoring Suppl (ONETOUCH VERIO) w/Device KIT 1 strip 4 (four) times daily by Does not apply route. Pharmacist, if not covered, provide [employer] Rel[employer] Meter, please counsel  . carvedilol (COREG) 6.25 MG tablet Take 1 tablet (6.25 mg total) by mouth 2 (two) times daily with a meal.  . diclofenac sodium (VOLTAREN) 1 % GEL Apply 4 g topically 4 (four) times daily.  Marland Kitchen docusate sodium (COLACE) 100 MG capsule Take 100 mg by mouth 2 (two) times daily.  . furosemide (LASIX) 40 MG tablet Take 2 tablets (80 mg total) by mouth daily.  Marland Kitchen glucose blood (ONE TOUCH ULTRA TEST) test strip Use four times daily before meals and at bedtime. Diagnosis code E11.3419  . ONETOUCH DELICA LANCETS 60Y MISC 1 strip 4 (four) times daily -  before meals and at bedtime by Does not apply route.  . predniSONE (DELTASONE) 20 MG tablet Take 2 tablets (40 mg total) by mouth daily for 5 days.    Marland Kitchen warfarin (COUMADIN) 4 MG tablet Take one and one half of tablets on Mondays, Wednesdays & Fridays. All other days, take only one tablet   No facility-administered encounter medications on file as of 04/28/2020.     Objective:  BP Readings from Last 3 Encounters:  03/11/20 (!) 170/97  01/04/20 (!) 148/78  12/31/19 (!) 169/106   Wt Readings from Last 3 Encounters:  03/11/20 165 lb 11.2 oz (75.2 kg)  01/04/20 168 lb 12.8 oz (76.6 kg)  12/31/19 166 lb 8 oz (75.5 kg)   Lab Results  Component Value Date   CHOL 128 11/20/2015   HDL 48 11/20/2015   LDLCALC 65 11/20/2015   TRIG 77 11/20/2015   CHOLHDL 2.7 11/20/2015    Goals Addressed              This Visit's Progress     Patient Stated   .  COMPLETED: Wife states " [name], he can't walk" (pt-stated)        CARE PLAN ENTRY (see longitudinal plan of care for additional care plan information)  Current Barriers:  Marland Kitchen Knowledge Deficits related to treatment of acute gout symptoms - reviewed treatment plan with wife with successful teach back  Nurse Case Manager Clinical Goal(s):  Marland Kitchen By end of phone call, patient's wife will voice understanding of treatment of acute gout.   Interventions:  . Inter-disciplinary care team collaboration (see longitudinal plan of care) . Provided education to patient's caregiver (wife) about treatment of gout  to include: Tylenol up to 3 gm per day, ice, elevation, rest and increase water intake and avoid foods with purine such as red meat, lamb , pork, organ meats, shellfish, high fructose products like sweetened soda, some juices, ice cream, candy cereal and fast food, beer and grain liquors like vodka and whiskey  Patient Self Care Activities:  . Patient's wife verbalizes understanding of plan to assist patient in treating his acute gout flare. . Unable to independently resolve pain from acute gout flare.  Initial goal documentation         Plan:   The care management team will  reach out to the patient again over the next 30-60 days.    Kelli Churn RN, CCM, [facility] RN Care Manager [phone]

## 2020-04-28 NOTE — Patient Instructions (Signed)
Visit Information It was nice speaking with you today Mrs Monarch, please try the suggestions we discussed to treat Mr. Adinolfi' gout pain. Goals Addressed              This Visit's Progress     Patient Stated   .  COMPLETED: Wife states " [name], he can't walk" (pt-stated)        CARE PLAN ENTRY (see longitudinal plan of care for additional care plan information)  Current Barriers:  Marland Kitchen Knowledge Deficits related to treatment of acute gout symptoms - reviewed treatment plan with wife with successful teach back  Nurse Case Manager Clinical Goal(s):  Marland Kitchen By end of phone call, patient's wife will voice understanding of treatment of acute gout.   Interventions:  . Inter-disciplinary care team collaboration (see longitudinal plan of care) . Provided education to patient's caregiver (wife) about treatment of gout to include: Tylenol up to 3 gm per day, ice, elevation, rest and increase water intake and avoid foods with purine such as red meat, lamb , pork, organ meats, shellfish, high fructose products like sweetened soda, some juices, ice cream, candy cereal and fast food, beer and grain liquors like vodka and whiskey  Patient Self Care Activities:  . Patient's wife verbalizes understanding of plan to assist patient in treating his acute gout flare. . Unable to independently resolve pain from acute gout flare.  Initial goal documentation        The patient verbalized understanding of instructions provided today and declined a print copy of patient instruction materials.   The care management team will reach out to the patient again over the next 30-60 days.   Kelli Churn RN, CCM, [facility] RN Care Manager [phone]

## 2020-04-28 NOTE — Progress Notes (Signed)
Internal Medicine Clinic Resident  I have personally reviewed this encounter including the documentation in this note and/or discussed this patient with the care management provider. I will address any urgent items identified by the care management provider and will communicate my actions to the patient's PCP. I have reviewed the patient's CCM visit with my supervising attending, Dr Dareen Piano.  Gaylan Gerold, DO 04/28/2020

## 2020-04-28 NOTE — Telephone Encounter (Signed)
Patient's wife called in requesting a message be sent to Dr. Elie Confer to see what pain reliever (Aspirin, ibuprofen, acetaminophen) patient can take for gout flare that will not interfere with warfarin. Please advise. Hubbard Hartshorn, BSN, RN-BC

## 2020-04-28 NOTE — Telephone Encounter (Signed)
I would not recommend NSAIDs or aspirin due to increased bleeding risk when taken in conjunction with warfarin. No recent LFTs to evaluate liver function. She can take up to 3g of tylenol daily.

## 2020-04-28 NOTE — Telephone Encounter (Signed)
Relayed info below to patient's wife. Hubbard Hartshorn, BSN, RN-BC

## 2020-04-29 ENCOUNTER — Telehealth: Payer: Self-pay | Admitting: *Deleted

## 2020-04-29 ENCOUNTER — Encounter: Payer: Self-pay | Admitting: Internal Medicine

## 2020-04-29 ENCOUNTER — Ambulatory Visit: Payer: Medicare Other | Admitting: Internal Medicine

## 2020-04-29 ENCOUNTER — Other Ambulatory Visit: Payer: Self-pay

## 2020-04-29 DIAGNOSIS — M1A0710 Idiopathic chronic gout, right ankle and foot, without tophus (tophi): Secondary | ICD-10-CM

## 2020-04-29 NOTE — Telephone Encounter (Signed)
Talked to pt's wife; explained telehealth appt, agreeable. Scheduled today @ 1545 Pm to discuss gout/med refill with Dr Koleen Distance.

## 2020-04-29 NOTE — Telephone Encounter (Signed)
-----   Message from Aldine Contes, MD sent at 04/29/2020 10:50 AM EDT ----- Regarding: Gout Hello,  Per CCM note patient is having a gout flare and has difficulty walking. Please set him up for an appointment so we can assess him and treat him.  Thank you, Dr. Dareen Piano

## 2020-04-29 NOTE — Progress Notes (Signed)
Called patient to discuss right first toe pain which started at the beginning of the week and feels similar to his gout flares in the past. He unfortunately had to be taken off allopurinol due to chronic kidney disease. I informed patient and spouse that 5 days of Prednisone had been sent in on 04/27/20. They were appreciative and will have their son pick this up for them. I instructed them that if the pain is no better after the course of Prednisone he will need to come in for evaluation.

## 2020-04-29 NOTE — Progress Notes (Signed)
Internal Medicine Clinic Attending  CCM services provided by the care management provider and their documentation were discussed with Dr. Alfonse Spruce. We reviewed the pertinent findings, urgent action items addressed by the resident and non-urgent items to be addressed by the PCP.  I agree with the assessment, diagnosis, and plan of care documented in the CCM and resident's note.  Aldine Contes, MD 04/29/2020

## 2020-04-29 NOTE — Telephone Encounter (Signed)
Yes please

## 2020-04-29 NOTE — Telephone Encounter (Signed)
Called pt - talked to his wife stated pt is upstairs and unable to walk down the stairs to come in for an appt. Do you want [name] to schedule a telehealth appt?

## 2020-04-29 NOTE — Assessment & Plan Note (Signed)
Reports pain in his right great toe approximately 4 days ago. Pain radiates into the mid-foot, and he has been unable to ambulate which is what prevented him from coming in for face-to-face eval. Pain is similar to gout flares in the past. Taken off allopurinol due to renal impairment. Per chart review, a prescription for Prednisone had been sent in on 9/29 which they were unaware of. Instructed to take this for 5 days and if not improved, will need to come in for further evaluation.

## 2020-05-02 ENCOUNTER — Telehealth: Payer: Self-pay | Admitting: Pharmacist

## 2020-05-02 NOTE — Telephone Encounter (Signed)
Called patient and spoke with his wife. Rescheduled appointment in anticoagulation management clinic from 11-OCT-21 TO 18-OCT-21 at 3:15 pm.

## 2020-05-04 ENCOUNTER — Ambulatory Visit (INDEPENDENT_AMBULATORY_CARE_PROVIDER_SITE_OTHER): Payer: Medicare Other | Admitting: Internal Medicine

## 2020-05-04 ENCOUNTER — Encounter: Payer: Self-pay | Admitting: Internal Medicine

## 2020-05-04 VITALS — BP 156/92 | HR 64 | Temp 97.9°F | Ht 66.0 in | Wt 170.4 lb

## 2020-05-04 DIAGNOSIS — M1A0710 Idiopathic chronic gout, right ankle and foot, without tophus (tophi): Secondary | ICD-10-CM

## 2020-05-04 DIAGNOSIS — E113419 Type 2 diabetes mellitus with severe nonproliferative diabetic retinopathy with macular edema, unspecified eye: Secondary | ICD-10-CM

## 2020-05-04 NOTE — Patient Instructions (Signed)
Thank you for allowing [name] to provide your care today. Today we discussed your foot pain    I have ordered no labs for you. I will call if any are abnormal.    Today we made no changes to your medications.    Please follow-up as needed.    Should you have any questions or concerns please call the internal medicine clinic at [phone].     Gout  Gout is painful swelling of your joints. Gout is a type of arthritis. It is caused by having too much uric acid in your body. Uric acid is a chemical that is made when your body breaks down substances called purines. If your body has too much uric acid, sharp crystals can form and build up in your joints. This causes pain and swelling. Gout attacks can happen quickly and be very painful (acute gout). Over time, the attacks can affect more joints and happen more often (chronic gout). What are the causes?  Too much uric acid in your blood. This can happen because: ? Your kidneys do not remove enough uric acid from your blood. ? Your body makes too much uric acid. ? You eat too many foods that are high in purines. These foods include organ meats, some seafood, and beer.  Trauma or stress. What increases the risk?  Having a family history of gout.  Being male and middle-aged.  Being male and having gone through menopause.  Being very overweight (obese).  Drinking alcohol, especially beer.  Not having enough water in the body (being dehydrated).  Losing weight too quickly.  Having an organ transplant.  Having lead poisoning.  Taking certain medicines.  Having kidney disease.  Having a skin condition called psoriasis. What are the signs or symptoms? An attack of acute gout usually happens in just one joint. The most common place is the big toe. Attacks often start at night. Other joints that may be affected include joints of the feet, ankle, knee, fingers, wrist, or elbow. Symptoms of an attack may include:  Very bad  pain.  Warmth.  Swelling.  Stiffness.  Shiny, red, or purple skin.  Tenderness. The affected joint may be very painful to touch.  Chills and fever. Chronic gout may cause symptoms more often. More joints may be involved. You may also have white or yellow lumps (tophi) on your hands or feet or in other areas near your joints. How is this treated?  Treatment for this condition has two phases: treating an acute attack and preventing future attacks.  Acute gout treatment may include: ? NSAIDs. ? Steroids. These are taken by mouth or injected into a joint. ? Colchicine. This medicine relieves pain and swelling. It can be given by mouth or through an IV tube.  Preventive treatment may include: ? Taking small doses of NSAIDs or colchicine daily. ? Using a medicine that reduces uric acid levels in your blood. ? Making changes to your diet. You may need to see a food expert (dietitian) about what to eat and drink to prevent gout. Follow these instructions at home: During a gout attack   If told, put ice on the painful area: ? Put ice in a plastic bag. ? Place a towel between your skin and the bag. ? Leave the ice on for 20 minutes, 2-3 times a day.  Raise (elevate) the painful joint above the level of your heart as often as you can.  Rest the joint as much as possible. If the joint  is in your leg, you may be given crutches.  Follow instructions from your doctor about what you cannot eat or drink. Avoiding future gout attacks  Eat a low-purine diet. Avoid foods and drinks such as: ? Liver. ? Kidney. ? Anchovies. ? Asparagus. ? Herring. ? Mushrooms. ? Mussels. ? Beer.  Stay at a healthy weight. If you want to lose weight, talk with your doctor. Do not lose weight too fast.  Start or continue an exercise plan as told by your doctor. Eating and drinking  Drink enough fluids to keep your pee (urine) pale yellow.  If you drink alcohol: ? Limit how much you use to:  0-1  drink a day for women.  0-2 drinks a day for men. ? Be aware of how much alcohol is in your drink. In the [place] one drink equals one 12 oz bottle of beer (355 mL), one 5 oz glass of wine (148 mL), or one 1 oz glass of hard liquor (44 mL). General instructions  Take over-the-counter and prescription medicines only as told by your doctor.  Do not drive or use heavy machinery while taking prescription pain medicine.  Return to your normal activities as told by your doctor. Ask your doctor what activities are safe for you.  Keep all follow-up visits as told by your doctor. This is important. Contact a doctor if:  You have another gout attack.  You still have symptoms of a gout attack after 10 days of treatment.  You have problems (side effects) because of your medicines.  You have chills or a fever.  You have burning pain when you pee (urinate).  You have pain in your lower back or belly. Get help right away if:  You have very bad pain.  Your pain cannot be controlled.  You cannot pee. Summary  Gout is painful swelling of the joints.  The most common site of pain is the big toe, but it can affect other joints.  Medicines and avoiding some foods can help to prevent and treat gout attacks. This information is not intended to replace advice given to you by your health care provider. Make sure you discuss any questions you have with your health care provider. Document Revised: 02/05/2018 Document Reviewed: 02/05/2018 Elsevier Patient Education  [place].

## 2020-05-04 NOTE — Progress Notes (Signed)
CC: Gout  HPI: Patrick [name] is a 74 y.o. with PMH listed below presenting with complaint of gout. Please see problem based assessment and plan for further details.  Past Medical History:  Diagnosis Date  . Atrial fibrillation ([place])   . CAD (coronary artery disease)   . Chronic anticoagulation, on coumadin 07/17/2013  . CKD (chronic kidney disease), stage II   . 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 Depression   . Diabetes mellitus type II    Insulin dependent  . GERD (gastroesophageal reflux disease)   . Gout   . Hyperlipidemia   . Hypertension   . Myocardial infarction [facility]) 2000's   "near heart attack" (07/14/2013)  . Paroxysmal atrial fibrillation ([facility])    on coumadin  . Permanent atrial fibrillation ([place]) 07/17/2013  . Shortness of breath    "can happen at any time" (07/14/2013)  . Sleep apnea 07/2010   "has mask at home; seldom uses it" (07/14/2013)  . Stomach ulcer 1950's   "as a teenager"  . Stroke [facility]) ~ 2007; ~ 2009   "memory not as good since" (07/14/2013)  . Tachy-brady syndrome ([place]) 07/17/2013  . Upper GI bleeding 07/01/2013   Review of Systems: Review of Systems  Constitutional: Negative for chills, fever and malaise/fatigue.  Eyes: Negative for blurred vision.  Respiratory: Negative for shortness of breath.   Cardiovascular: Negative for chest pain and leg swelling.  Gastrointestinal: Negative for constipation, diarrhea, nausea and vomiting.  Musculoskeletal: Positive for joint pain. Negative for back pain.  Neurological: Negative for dizziness and headaches.  All other systems reviewed and are negative.    Physical Exam: Vitals:   05/04/20 1548 05/04/20 1549 05/04/20 1605  BP:  (!) 177/98 (!) 156/92  Pulse:  64   Temp:  97.9 F (36.6 C)   TempSrc:  Oral   SpO2:  100%   Weight: 170 lb 6.4 oz (77.3 kg) 170 lb 6.4 oz (77.3 kg)   Height: 5\' 6"  (1.676 m)      Gen: Well-developed, well  nourished, NAD HEENT: NCAT head, hearing intact CV: RRR, S1, S2 normal Pulm: CTAB, No rales, no wheezes Extm: ROM intact, Peripheral pulses intact, Non-tender, cool, mildly enlarged R great toe Skin: Dry, Warm, normal turgor, significantly extended toenails  Assessment & Plan:   Gout Patrick [name] is a 74 yo M w/ PMH of CVA, vascular dementia, HLD, Gout, DM presenting to [facility] for follow up for GOUT. He had significant tenderness and swelling of his R Great toe and had called the clinic at the time. 5 day course of prednisone was sent to his pharmacy at the time. Due to his CVA and vascular dementia, unable to pick up his meds. His son had to be contacted and was able to pick up the medication after some time. He has just recently started taking his prednisone and is currently day 3/5 prednisone dose. He mentions continuing to endorse pain of his R toe although the severity has improved  A/P Present for f/u for Gout. Appear to have improved based on history. No warmth, no tenderness on exam. Should improve after finishing steroid course. Unable to tolerate allopurinol due to CKD - C/w prednisone 40mg  for 2 more days  Diabetes mellitus ([place]) Lab Results  Component Value Date   HGBA1C 6.1 (A) 12/31/2019   Well-controlled on diet alone. Last hgb a1c persistently in pre-diabetic range. However during foot exam, noted to have very poorly  kept feet with dry skin and limited ability for patient to assess for wounds. High risk for developing diabetic foot wounds. Need to follow up with podiatry for foot care.  - C/w glycemic control via diet - Referral to podiatry    Patient discussed with Dr. Jimmye Norman  -Gilberto Better, [facility] Pager: [phone]

## 2020-05-05 ENCOUNTER — Encounter: Payer: Self-pay | Admitting: Internal Medicine

## 2020-05-05 NOTE — Assessment & Plan Note (Signed)
Patrick [name] is a 74 yo M w/ PMH of CVA, vascular dementia, HLD, Gout, DM presenting to [facility] for follow up for GOUT. He had significant tenderness and swelling of his R Great toe and had called the clinic at the time. 5 day course of prednisone was sent to his pharmacy at the time. Due to his CVA and vascular dementia, unable to pick up his meds. His son had to be contacted and was able to pick up the medication after some time. He has just recently started taking his prednisone and is currently day 3/5 prednisone dose. He mentions continuing to endorse pain of his R toe although the severity has improved  A/P Present for f/u for Gout. Appear to have improved based on history. No warmth, no tenderness on exam. Should improve after finishing steroid course. Unable to tolerate allopurinol due to CKD - C/w prednisone 40mg  for 2 more days

## 2020-05-05 NOTE — Assessment & Plan Note (Signed)
Lab Results  Component Value Date   HGBA1C 6.1 (A) 12/31/2019   Well-controlled on diet alone. Last hgb a1c persistently in pre-diabetic range. However during foot exam, noted to have very poorly kept feet with dry skin and limited ability for patient to assess for wounds. High risk for developing diabetic foot wounds. Need to follow up with podiatry for foot care.  - C/w glycemic control via diet - Referral to podiatry

## 2020-05-05 NOTE — Progress Notes (Signed)
Internal Medicine Clinic Attending  Case discussed with Dr. Bloomfield  At the time of the visit.  We reviewed the resident's history and exam and pertinent patient test results.  I agree with the assessment, diagnosis, and plan of care documented in the resident's note.  

## 2020-05-06 ENCOUNTER — Encounter: Payer: Medicare Other | Admitting: Internal Medicine

## 2020-05-06 NOTE — Progress Notes (Signed)
DOS 05/04/20:  Internal Medicine Clinic Attending  Case discussed with Dr. Truman Hayward  At the time of the visit.  We reviewed the resident's history and exam and pertinent patient test results.  I agree with the assessment, diagnosis, and plan of care documented in the resident's note.

## 2020-05-09 ENCOUNTER — Ambulatory Visit: Payer: Medicare Other

## 2020-05-09 ENCOUNTER — Other Ambulatory Visit: Payer: Medicare Other

## 2020-05-10 NOTE — Addendum Note (Signed)
Addended by: Hulan Fray on: 05/10/2020 03:55 PM   Modules accepted: Orders

## 2020-05-16 ENCOUNTER — Ambulatory Visit: Payer: Medicare Other

## 2020-05-27 ENCOUNTER — Ambulatory Visit: Payer: Medicare Other | Admitting: *Deleted

## 2020-05-27 DIAGNOSIS — I6381 Other cerebral infarction due to occlusion or stenosis of small artery: Secondary | ICD-10-CM

## 2020-05-27 DIAGNOSIS — M1A0710 Idiopathic chronic gout, right ankle and foot, without tophus (tophi): Secondary | ICD-10-CM

## 2020-05-27 DIAGNOSIS — E113419 Type 2 diabetes mellitus with severe nonproliferative diabetic retinopathy with macular edema, unspecified eye: Secondary | ICD-10-CM

## 2020-05-27 DIAGNOSIS — I10 Essential (primary) hypertension: Secondary | ICD-10-CM

## 2020-05-27 DIAGNOSIS — I63219 Cerebral infarction due to unspecified occlusion or stenosis of unspecified vertebral arteries: Secondary | ICD-10-CM

## 2020-05-27 NOTE — Chronic Care Management (AMB) (Signed)
Chronic Care Management   Follow Up Note   05/27/2020 Name: Patrick [name] MRN: [id number] DOB: 1946-05-15  Referred by: Patrick Hatch, MD Reason for referral : Chronic Care Management (NIDDM, HF, HTN, CKD, CAD/MI, PAF, Gout, previous CVA)   Patrick [name] is a 74 y.o. year old male who is a primary care patient of Patrick [name], Patrick Rue, MD. The CCM team was consulted for assistance with chronic disease management and care coordination needs.    Review of patient status, including review of consultants reports, relevant laboratory and other test results, and collaboration with appropriate care team members and the patient's provider was performed as part of comprehensive patient evaluation and provision of chronic care management services.    SDOH (Social Determinants of Health) assessments performed: No See Care Plan activities for detailed interventions related to [name])     Outpatient Encounter Medications as of 05/27/2020  Medication Sig  . amLODipine (NORVASC) 10 MG tablet Take 1 tablet (10 mg total) by mouth daily.  . Blood Glucose Monitoring Suppl (ONETOUCH VERIO) w/Device KIT 1 strip 4 (four) times daily by Does not apply route. Pharmacist, if not covered, provide [employer] Meter, please counsel  . carvedilol (COREG) 6.25 MG tablet Take 1 tablet (6.25 mg total) by mouth 2 (two) times daily with a meal.  . diclofenac sodium (VOLTAREN) 1 % GEL Apply 4 g topically 4 (four) times daily.  Marland Kitchen docusate sodium (COLACE) 100 MG capsule Take 100 mg by mouth 2 (two) times daily.  . furosemide (LASIX) 40 MG tablet Take 2 tablets (80 mg total) by mouth daily.  Marland Kitchen glucose blood (ONE TOUCH ULTRA TEST) test strip Use four times daily before meals and at bedtime. Diagnosis code E11.3419  . ONETOUCH DELICA LANCETS 22G MISC 1 strip 4 (four) times daily -  before meals and at bedtime by Does not apply route.  . warfarin (COUMADIN) 4 MG tablet Take one and one half of tablets on Mondays,  Wednesdays & Fridays. All other days, take only one tablet   No facility-administered encounter medications on file as of 05/27/2020.     Objective:  BP Readings from Last 3 Encounters:  05/04/20 (!) 156/92  03/11/20 (!) 170/97  01/04/20 (!) 148/78   Wt Readings from Last 3 Encounters:  05/04/20 170 lb 6.4 oz (77.3 kg)  03/11/20 165 lb 11.2 oz (75.2 kg)  01/04/20 168 lb 12.8 oz (76.6 kg)   Lab Results  Component Value Date   HGBA1C 6.1 (A) 12/31/2019   HGBA1C 6.3 (A) 09/10/2019   HGBA1C 7.4 (A) 06/04/2018   Lab Results  Component Value Date   MICROALBUR 94.9 (H) 10/18/2014   LDLCALC 65 11/20/2015   CREATININE 2.26 (H) 12/31/2019    Goals Addressed              This Visit's Progress     Patient Stated   .  COMPLETED: Wife states " [name], he can't walk" (pt-stated)        CARE PLAN ENTRY (see longitudinal plan of care for additional care plan information)  Current Barriers:  Marland Kitchen Knowledge Deficits related to treatment of acute gout symptoms - patient's wife says his gout is better although he is still walking with a limp on the right foot, she says she thinks he has taken all of the prednisone  Nurse Case Manager Clinical Goal(s):  Marland Kitchen By end of phone call, patient's wife will voice understanding of treatment of acute gout.   Interventions:  .  Assessed status of gout exacerbation  Patient Self Care Activities:  . Patient's wife verbalizes understanding of plan to assist patient in treating his acute gout flare. . Unable to independently resolve pain from acute gout flare.  Initial goal documentation     .  Wife states " We could use some help with his blood pressure. He hasn't had his eyes checked in 3-4 years." (pt-stated)        CARE PLAN ENTRY (see longitudinal plan of care for additional care plan information)  Current Barriers:  . Chronic Disease Management support, education, and care coordination needs related to Atrial Fibrillation, CHF, CAD,  HTN, and DMII- spoke with wife, she says they are doing OK, says patient made an appointment to have his eyes checked, says the prednisone helped his gout, she says patient is not taking his blood pressure and home and when she offers to do it, he says no, when asked about the reason  the appointment on 10/18 with the coumadin clinic was canceled, [name] said, "we didn't cancel it the clinic did" ( per the appointment tab information, the appointment was "Canceled via automated reminder system (Automated Cancel"   Clinical Goal(s) related to Atrial Fibrillation, CHF, CAD, HTN, and DMII:  Over the next 30-60 days, patient will:  . Work with the care management team to address educational, disease management, and care coordination needs  . Begin or continue self health monitoring activities as directed today  - work with chronic care management staff and internal medicine team to get blood pressure to target . Call provider office for new or worsened signs and symptoms Blood pressure findings outside established parameters . Call care management team with questions or concerns . Verbalize basic understanding of patient centered plan of care established today  Interventions related to Atrial Fibrillation, CHF, CAD, HTN, and DMII:  . Appropriate assessments completed . Encouraged wife to make follow up appointment at coumadin clinic  . Will also notify Dr Elie Confer, Coumadin Clinic pharmacist . Discussed plans with patient for ongoing care management follow up and ensured patient has contact number for CCM RN   Patient Self Care Activities related to Atrial Fibrillation, CHF, CAD, HTN, and DMII:  . Patient is unable to independently self-manage chronic health conditions  Please see past updates related to this goal by clicking on the "Past Updates" button in the selected goal          Plan:   The care management team will reach out to the patient again over the next 30-60 days.    Kelli Churn RN, CCM, [facility] RN Care Manager [phone]

## 2020-05-27 NOTE — Progress Notes (Signed)
Internal Medicine Clinic Resident  I have personally reviewed this encounter including the documentation in this note and/or discussed this patient with the care management provider. I will address any urgent items identified by the care management provider and will communicate my actions to the patient's PCP. I have reviewed the patient's CCM visit with my supervising attending, Dr Williams.  Rishita Petron K Cirilo Canner, MD 05/27/2020   

## 2020-05-27 NOTE — Patient Instructions (Signed)
Visit Information It was nice speaking with you today. Please make an appointment with the [facility] ASAP.  Goals Addressed              This Visit's Progress     Patient Stated   .  COMPLETED: Wife states " [name], he can't walk" (pt-stated)        CARE PLAN ENTRY (see longitudinal plan of care for additional care plan information)  Current Barriers:  Marland Kitchen Knowledge Deficits related to treatment of acute gout symptoms - patient's wife says his gout is better although he is still walking with a limp on the right foot, she says she thinks he has taken all of the prednisone  Nurse Case Manager Clinical Goal(s):  Marland Kitchen By end of phone call, patient's wife will voice understanding of treatment of acute gout.   Interventions:  . Assessed status of gout exacerbation  Patient Self Care Activities:  . Patient's wife verbalizes understanding of plan to assist patient in treating his acute gout flare. . Unable to independently resolve pain from acute gout flare.  Initial goal documentation     .  Wife states " We could use some help with his blood pressure. He hasn't had his eyes checked in 3-4 years." (pt-stated)        CARE PLAN ENTRY (see longitudinal plan of care for additional care plan information)  Current Barriers:  . Chronic Disease Management support, education, and care coordination needs related to Atrial Fibrillation, CHF, CAD, HTN, and DMII- spoke with wife, she says they are doing OK, says patient made an appointment to have his eyes checked, says the prednisone helped his gout, she says patient is not taking his blood pressure and home and when she offers to do it, he says no, when asked about the reason  the appointment on 10/18 with the coumadin clinic was canceled, Mrs Geesey said, "we didn't cancel it the clinic did" ( per the appointment tab information, the appointment was "Canceled via automated reminder system (Automated Cancel"   Clinical Goal(s) related  to Atrial Fibrillation, CHF, CAD, HTN, and DMII:  Over the next 30-60 days, patient will:  . Work with the care management team to address educational, disease management, and care coordination needs  . Begin or continue self health monitoring activities as directed today  - work with chronic care management staff and internal medicine team to get blood pressure to target . Call provider office for new or worsened signs and symptoms Blood pressure findings outside established parameters . Call care management team with questions or concerns . Verbalize basic understanding of patient centered plan of care established today  Interventions related to Atrial Fibrillation, CHF, CAD, HTN, and DMII:  . Appropriate assessments completed . Encouraged wife to make follow up appointment at coumadin clinic  . Will also notify Dr Elie Confer, [facility] pharmacist . Discussed plans with patient for ongoing care management follow up and ensured patient has contact number for CCM RN   Patient Self Care Activities related to Atrial Fibrillation, CHF, CAD, HTN, and DMII:  . Patient is unable to independently self-manage chronic health conditions  Please see past updates related to this goal by clicking on the "Past Updates" button in the selected goal         The patient verbalized understanding of instructions provided today and declined a print copy of patient instruction materials.   The care management team will reach out to the patient again  over the next 30-60 days.   Kelli Churn RN, CCM, [facility] RN Care Manager [phone]

## 2020-06-10 ENCOUNTER — Other Ambulatory Visit: Payer: Self-pay | Admitting: Student

## 2020-06-10 DIAGNOSIS — Z7901 Long term (current) use of anticoagulants: Secondary | ICD-10-CM

## 2020-06-10 NOTE — Telephone Encounter (Signed)
Patient has had poor follow up with coumadin clinic. Please try to schedule her for an appointment with them soon.

## 2020-06-14 NOTE — Telephone Encounter (Signed)
Coumadin appt 11/29.

## 2020-06-27 ENCOUNTER — Telehealth: Payer: Medicare Other

## 2020-06-27 ENCOUNTER — Ambulatory Visit: Payer: Medicare Other

## 2020-06-29 ENCOUNTER — Other Ambulatory Visit: Payer: Self-pay | Admitting: Student

## 2020-06-29 DIAGNOSIS — I10 Essential (primary) hypertension: Secondary | ICD-10-CM

## 2020-07-04 ENCOUNTER — Ambulatory Visit: Payer: Medicare Other | Admitting: *Deleted

## 2020-07-04 DIAGNOSIS — E113419 Type 2 diabetes mellitus with severe nonproliferative diabetic retinopathy with macular edema, unspecified eye: Secondary | ICD-10-CM

## 2020-07-04 DIAGNOSIS — I10 Essential (primary) hypertension: Secondary | ICD-10-CM

## 2020-07-04 NOTE — Chronic Care Management (AMB) (Signed)
Chronic Care Management   Follow Up Note   07/04/2020 Name: Patrick [name] MRN: [id number] DOB: January 20, 1946  Referred by: [name], MD Reason for referral : Chronic Care Management ( NIDDM, HF, HTN, CKD, CAD/MI, PAF, Gout, previous CVA)   Patrick [name] is a 74 y.o. year old male who is a primary care patient of [name], Dorthula Rue, MD. The CCM team was consulted for assistance with chronic disease management and care coordination needs.    Review of patient status, including review of consultants reports, relevant laboratory and other test results, and collaboration with appropriate care team members and the patient's provider was performed as part of comprehensive patient evaluation and provision of chronic care management services.    SDOH (Social Determinants of Health) assessments performed: No See Care Plan activities for detailed interventions related to [facility])     Outpatient Encounter Medications as of 07/04/2020  Medication Sig  . amLODipine (NORVASC) 10 MG tablet Take 1 tablet (10 mg total) by mouth daily.  . Blood Glucose Monitoring Suppl (ONETOUCH VERIO) w/Device KIT 1 strip 4 (four) times daily by Does not apply route. Pharmacist, if not covered, provide [employer] Rel[employer] Meter, please counsel  . carvedilol (COREG) 6.25 MG tablet Take 1 tablet (6.25 mg total) by mouth 2 (two) times daily with a meal.  . diclofenac sodium (VOLTAREN) 1 % GEL Apply 4 g topically 4 (four) times daily.  Marland Kitchen docusate sodium (COLACE) 100 MG capsule Take 100 mg by mouth 2 (two) times daily.  . furosemide (LASIX) 40 MG tablet Take 2 tablets by mouth once daily  . glucose blood (ONE TOUCH ULTRA TEST) test strip Use four times daily before meals and at bedtime. Diagnosis code E11.3419  . ONETOUCH DELICA LANCETS 40H MISC 1 strip 4 (four) times daily -  before meals and at bedtime by Does not apply route.  . warfarin (COUMADIN) 4 MG tablet TAKE 1 & 1/2 (ONE & ONE-HALF) TABLETS BY MOUTH EVERY MONDAY AND  EVERY WEDNESDAY AND EVERY FRIDAY ALL  OTHER  DAYS,TAKE  ONLY  ONE  TABLET   No facility-administered encounter medications on file as of 07/04/2020.     Objective:  Wt Readings from Last 3 Encounters:  05/04/20 170 lb 6.4 oz (77.3 kg)  03/11/20 165 lb 11.2 oz (75.2 kg)  01/04/20 168 lb 12.8 oz (76.6 kg)   BP Readings from Last 3 Encounters:  05/04/20 (!) 156/92  03/11/20 (!) 170/97  01/04/20 (!) 148/78   Lab Results  Component Value Date   HGBA1C 6.1 (A) 12/31/2019   HGBA1C 6.3 (A) 09/10/2019   HGBA1C 7.4 (A) 06/04/2018   Lab Results  Component Value Date   MICROALBUR 94.9 (H) 10/18/2014   LDLCALC 65 11/20/2015   CREATININE 2.26 (H) 12/31/2019    Goals Addressed              This Visit's Progress     Patient Stated   .  Wife states " We could use some help with his blood pressure. He hasn't had his eyes checked in 3-4 years." (pt-stated)        CARE PLAN ENTRY (see longitudinal plan of care for additional care plan information)  Current Barriers:  . Chronic Disease Management support, education, and care coordination needs related to Atrial Fibrillation, CHF, CAD, HTN, and DMII- spoke with wife, she says they are doing OK, says patient's ambulation is now back to baseline as the gout exacerbation has resolved,  made an appointment to have his eyes  checked, says the prednisone helped his gout, she says patient is not taking his blood pressure and home and when she offers to do it, he says no  Clinical Goal(s) related to Atrial Fibrillation, CHF, CAD, HTN, and DMII:  Over the next 30-60 days, patient will:  . Work with the care management team to address educational, disease management, and care coordination needs  . Begin or continue self health monitoring activities as directed today  - work with chronic care management staff and internal medicine team to get blood pressure to target . Call provider office for new or worsened signs and symptoms Blood pressure findings  outside established parameters . Call care management team with questions or concerns . Verbalize basic understanding of patient centered plan of care established today  Interventions related to Atrial Fibrillation, CHF, CAD, HTN, and DMII:  . Appropriate assessments completed . Discussed plans with patient for ongoing care management follow up and ensured patient has contact number for CCM RN   Patient Self Care Activities related to Atrial Fibrillation, CHF, CAD, HTN, and DMII:  . Patient is unable to independently self-manage chronic health conditions  Please see past updates related to this goal by clicking on the "Past Updates" button in the selected goal        Plan:   The care management team will reach out to the patient again over the next 30-60 days.    Kelli Churn RN, CCM, [facility] RN Care Manager [phone]

## 2020-07-04 NOTE — Patient Instructions (Signed)
Visit Information  Goals Addressed              This Visit's Progress     Patient Stated   .  Wife states " We could use some help with his blood pressure. He hasn't had his eyes checked in 3-4 years." (pt-stated)        CARE PLAN ENTRY (see longitudinal plan of care for additional care plan information)  Current Barriers:  . Chronic Disease Management support, education, and care coordination needs related to Atrial Fibrillation, CHF, CAD, HTN, and DMII- spoke with wife, she says they are doing OK, says patient's ambulation is now back to baseline as the gout exacerbation has resolved,  made an appointment to have his eyes checked, says the prednisone helped his gout, she says patient is not taking his blood pressure and home and when she offers to do it, he says no  Clinical Goal(s) related to Atrial Fibrillation, CHF, CAD, HTN, and DMII:  Over the next 30-60 days, patient will:  . Work with the care management team to address educational, disease management, and care coordination needs  . Begin or continue self health monitoring activities as directed today  - work with chronic care management staff and internal medicine team to get blood pressure to target . Call provider office for new or worsened signs and symptoms Blood pressure findings outside established parameters . Call care management team with questions or concerns . Verbalize basic understanding of patient centered plan of care established today  Interventions related to Atrial Fibrillation, CHF, CAD, HTN, and DMII:  . Appropriate assessments completed . Discussed plans with patient for ongoing care management follow up and ensured patient has contact number for CCM RN   Patient Self Care Activities related to Atrial Fibrillation, CHF, CAD, HTN, and DMII:  . Patient is unable to independently self-manage chronic health conditions  Please see past updates related to this goal by clicking on the "Past Updates" button in  the selected goal         The patient's wife verbalized understanding of instructions, educational materials, and care plan provided today and declined offer to receive copy of patient instructions, educational materials, and care plan.   The care management team will reach out to the patient again over the next 30-60 days.  Kelli Churn RN, CCM, [facility] RN Care Manager [phone]

## 2020-07-04 NOTE — Progress Notes (Signed)
Internal Medicine Clinic Resident  I have personally reviewed this encounter including the documentation in this note and/or discussed this patient with the care management provider. I will address any urgent items identified by the care management provider and will communicate my actions to the patient's PCP. I have reviewed the patient's CCM visit with my supervising attending, Dr Narendra.  Matt Kc Summerson, MD 07/04/2020   

## 2020-07-08 NOTE — Progress Notes (Signed)
Internal Medicine Clinic Attending  CCM services provided by the care management provider and their documentation were discussed with Dr. Wynetta Emery. We reviewed the pertinent findings, urgent action items addressed by the resident and non-urgent items to be addressed by the PCP.  I agree with the assessment, diagnosis, and plan of care documented in the CCM and resident's note.  Aldine Contes, MD 07/08/2020

## 2020-08-05 ENCOUNTER — Ambulatory Visit: Payer: Medicare Other | Admitting: *Deleted

## 2020-08-05 DIAGNOSIS — N184 Chronic kidney disease, stage 4 (severe): Secondary | ICD-10-CM

## 2020-08-05 DIAGNOSIS — I10 Essential (primary) hypertension: Secondary | ICD-10-CM

## 2020-08-05 DIAGNOSIS — M1A0710 Idiopathic chronic gout, right ankle and foot, without tophus (tophi): Secondary | ICD-10-CM

## 2020-08-05 DIAGNOSIS — E113419 Type 2 diabetes mellitus with severe nonproliferative diabetic retinopathy with macular edema, unspecified eye: Secondary | ICD-10-CM

## 2020-08-05 DIAGNOSIS — I6381 Other cerebral infarction due to occlusion or stenosis of small artery: Secondary | ICD-10-CM

## 2020-08-05 DIAGNOSIS — I63219 Cerebral infarction due to unspecified occlusion or stenosis of unspecified vertebral arteries: Secondary | ICD-10-CM

## 2020-08-05 NOTE — Chronic Care Management (AMB) (Signed)
Chronic Care Management   Follow Up Note   08/05/2020 Name: Patrick [name] MRN: [id number] DOB: [birth year]  Referred by: Chevis Pretty, MD Reason for referral : Chronic Care Management (NIDDM, HF, HTN, CKD, CAD/MI, PAF, Gout, previous CVA)   Patrick [name] is a 75 y.o. year old male who is a primary care patient of [name], Shawna Orleans, MD. The CCM team was consulted for assistance with chronic disease management and care coordination needs.    Review of patient status, including review of consultants reports, relevant laboratory and other test results, and collaboration with appropriate care team members and the patient's provider was performed as part of comprehensive patient evaluation and provision of chronic care management services.    SDOH (Social Determinants of Health) assessments performed: No See Care Plan activities for detailed interventions related to [facility])     Outpatient Encounter Medications as of 08/05/2020  Medication Sig  . amLODipine (NORVASC) 10 MG tablet Take 1 tablet (10 mg total) by mouth daily.  . Blood Glucose Monitoring Suppl (ONETOUCH VERIO) w/Device KIT 1 strip 4 (four) times daily by Does not apply route. Pharmacist, if not covered, provide [employer] Meter, please counsel  . carvedilol (COREG) 6.25 MG tablet Take 1 tablet (6.25 mg total) by mouth 2 (two) times daily with a meal.  . diclofenac sodium (VOLTAREN) 1 % GEL Apply 4 g topically 4 (four) times daily.  Marland Kitchen docusate sodium (COLACE) 100 MG capsule Take 100 mg by mouth 2 (two) times daily.  . furosemide (LASIX) 40 MG tablet Take 2 tablets by mouth once daily  . glucose blood (ONE TOUCH ULTRA TEST) test strip Use four times daily before meals and at bedtime. Diagnosis code E11.3419  . ONETOUCH DELICA LANCETS 33G MISC 1 strip 4 (four) times daily -  before meals and at bedtime by Does not apply route.  . warfarin (COUMADIN) 4 MG tablet TAKE 1 & 1/2 (ONE & ONE-HALF) TABLETS BY MOUTH EVERY MONDAY AND  EVERY WEDNESDAY AND EVERY FRIDAY ALL  OTHER  DAYS,TAKE  ONLY  ONE  TABLET   No facility-administered encounter medications on file as of 08/05/2020.     Objective:   Goals Addressed              This Visit's Progress     Patient Stated   .  COMPLETED: Wife states " We could use some help with his blood pressure. He hasn't had his eyes checked in 3-4 years." (pt-stated)        CARE PLAN ENTRY (see longitudinal plan of care for additional care plan information)  Current Barriers:  . Chronic Disease Management support, education, and care coordination needs related to Atrial Fibrillation, CHF, CAD, HTN, and DMII- spoke with wife, she says they are doing OK, says patient's ambulation is now back to baseline as the gout exacerbation has resolved,  says she doesn't think the monthly calls from this CCM RN are necessary and asks to stop CCM services .  Clinical Goal(s) related to Atrial Fibrillation, CHF, CAD, HTN, and DMII:  Over the next 30-60 days, patient will:  . Work with the care management team to address educational, disease management, and care coordination needs  . Begin or continue self health monitoring activities as directed today  - work with chronic care management staff and internal medicine team to get blood pressure to target . Call provider office for new or worsened signs and symptoms Blood pressure findings outside established parameters . Call care management team with  questions or concerns . Verbalize basic understanding of patient centered plan of care established today  Interventions related to Atrial Fibrillation, CHF, CAD, HTN, and DMII:  . Closing services to CCM program per wife's request   Patient Self Care Activities related to Atrial Fibrillation, CHF, CAD, HTN, and DMII:  . Patient is unable to independently self-manage chronic health conditions  Please see past updates related to this goal by clicking on the "Past Updates" button in the selected goal          Plan:   CCM enrollment status changed to "previously enrolled" as per patient's wife's  request on 08/05/20 to discontinue enrollment. Case closed to case management services in primary care home.    Kelli Churn RN, CCM, [facility] RN Care Manager [phone]

## 2020-10-06 ENCOUNTER — Other Ambulatory Visit: Payer: Self-pay | Admitting: Student

## 2020-10-06 ENCOUNTER — Other Ambulatory Visit: Payer: Self-pay | Admitting: Internal Medicine

## 2020-10-06 DIAGNOSIS — Z7901 Long term (current) use of anticoagulants: Secondary | ICD-10-CM

## 2020-10-06 DIAGNOSIS — I10 Essential (primary) hypertension: Secondary | ICD-10-CM

## 2020-10-06 NOTE — Telephone Encounter (Signed)
Front desk--please arrange an appointment with one of our providers at pt's  earliest convenience. Thank you 

## 2020-10-07 ENCOUNTER — Other Ambulatory Visit: Payer: Self-pay | Admitting: *Deleted

## 2020-10-07 DIAGNOSIS — I10 Essential (primary) hypertension: Secondary | ICD-10-CM

## 2020-10-07 NOTE — Telephone Encounter (Signed)
Front desk--please arrange an appointment with one of our providers at pt's  earliest convenience. Thank you 

## 2020-10-10 ENCOUNTER — Other Ambulatory Visit: Payer: Self-pay | Admitting: Internal Medicine

## 2020-10-10 DIAGNOSIS — I10 Essential (primary) hypertension: Secondary | ICD-10-CM

## 2020-10-10 NOTE — Telephone Encounter (Signed)
Were you able to reach her for an office visit?

## 2020-10-11 NOTE — Telephone Encounter (Signed)
Called / talked to pt's wife about scheduling an appt for her husband; informed last INR was 6 months ago. Requested an appt for Thursday - appt scheduled Thurs 3/17 @ 1345PM with Dr Darrick Meigs.

## 2020-10-11 NOTE — Telephone Encounter (Signed)
Dr Elie Confer will see pt @ 1415PM on Thursday 3/17.

## 2020-10-11 NOTE — Telephone Encounter (Signed)
Great. And an appointment with Dr. Elie Confer?

## 2020-10-11 NOTE — Telephone Encounter (Signed)
Perfect

## 2020-10-13 ENCOUNTER — Ambulatory Visit (INDEPENDENT_AMBULATORY_CARE_PROVIDER_SITE_OTHER): Payer: Medicare Other | Admitting: Pharmacist

## 2020-10-13 ENCOUNTER — Encounter: Payer: Self-pay | Admitting: Internal Medicine

## 2020-10-13 ENCOUNTER — Ambulatory Visit (INDEPENDENT_AMBULATORY_CARE_PROVIDER_SITE_OTHER): Payer: Medicare Other | Admitting: Internal Medicine

## 2020-10-13 VITALS — BP 162/94 | HR 67 | Wt 168.1 lb

## 2020-10-13 DIAGNOSIS — I63219 Cerebral infarction due to unspecified occlusion or stenosis of unspecified vertebral arteries: Secondary | ICD-10-CM

## 2020-10-13 DIAGNOSIS — I4821 Permanent atrial fibrillation: Secondary | ICD-10-CM | POA: Diagnosis not present

## 2020-10-13 DIAGNOSIS — I6322 Cerebral infarction due to unspecified occlusion or stenosis of basilar arteries: Secondary | ICD-10-CM | POA: Diagnosis not present

## 2020-10-13 DIAGNOSIS — E113419 Type 2 diabetes mellitus with severe nonproliferative diabetic retinopathy with macular edema, unspecified eye: Secondary | ICD-10-CM

## 2020-10-13 DIAGNOSIS — N184 Chronic kidney disease, stage 4 (severe): Secondary | ICD-10-CM | POA: Diagnosis not present

## 2020-10-13 DIAGNOSIS — I5022 Chronic systolic (congestive) heart failure: Secondary | ICD-10-CM | POA: Diagnosis not present

## 2020-10-13 DIAGNOSIS — I25119 Atherosclerotic heart disease of native coronary artery with unspecified angina pectoris: Secondary | ICD-10-CM

## 2020-10-13 DIAGNOSIS — Z7901 Long term (current) use of anticoagulants: Secondary | ICD-10-CM | POA: Diagnosis not present

## 2020-10-13 DIAGNOSIS — I130 Hypertensive heart and chronic kidney disease with heart failure and stage 1 through stage 4 chronic kidney disease, or unspecified chronic kidney disease: Secondary | ICD-10-CM

## 2020-10-13 DIAGNOSIS — E1165 Type 2 diabetes mellitus with hyperglycemia: Secondary | ICD-10-CM

## 2020-10-13 DIAGNOSIS — I10 Essential (primary) hypertension: Secondary | ICD-10-CM

## 2020-10-13 DIAGNOSIS — I6381 Other cerebral infarction due to occlusion or stenosis of small artery: Secondary | ICD-10-CM

## 2020-10-13 LAB — POCT INR: INR: 1.1 — AB (ref 2.0–3.0)

## 2020-10-13 MED ORDER — CARVEDILOL 6.25 MG PO TABS: 6.2500 mg | ORAL_TABLET | Freq: Two times a day (BID) | ORAL | 0 refills | Status: DC

## 2020-10-13 MED ORDER — FUROSEMIDE 40 MG PO TABS
80.0000 mg | ORAL_TABLET | Freq: Every day | ORAL | 0 refills | Status: DC
Start: 2020-10-13 — End: 2022-07-05

## 2020-10-13 MED ORDER — WARFARIN SODIUM 4 MG PO TABS: ORAL_TABLET | ORAL | 1 refills | Status: DC

## 2020-10-13 MED ORDER — AMLODIPINE BESYLATE 10 MG PO TABS: 10.0000 mg | ORAL_TABLET | Freq: Every day | ORAL | 0 refills | Status: DC

## 2020-10-13 NOTE — Progress Notes (Signed)
Anticoagulation Management Patrick [name] is a 75 y.o. male who reports to the clinic for monitoring of warfarin treatment.    Indication: atrial fibrillation, history of stroke; long term current use of warfarin to maintain INR 2.0 - 3.0  Duration: indefinite Supervising physician: Lalla Brothers  Anticoagulation Clinic Visit History: Patient does not report signs/symptoms of bleeding or thromboembolism  Other recent changes: No diet, medications, lifestyle except as noted in patient findings.  Anticoagulation Episode Summary    Current INR goal:  2.0-3.0  TTR:  40.1 % (7.1 y)  Next INR check:  11/07/2020  INR from last check:  1.1 (10/13/2020)  Weekly max warfarin dose:    Target end date:  Indefinite  INR check location:  Anticoagulation Clinic  Preferred lab:    Send INR reminders to:     Indications   ATRIAL FIBRILLATION PAROXYSMAL CHRONIC (Resolved) [I48.91] Acute ischemic VBA thalamic stroke (HCC) [F81.829 I63.22] Chronic anticoagulation on coumadin [Z79.01]       Comments:          No Known Allergies  Current Outpatient Medications:  .  Blood Glucose Monitoring Suppl (ONETOUCH VERIO) w/Device KIT, 1 strip 4 (four) times daily by Does not apply route. Pharmacist, if not covered, provide [employer] Relion Meter, please counsel, Disp: 1 kit, Rfl: 0 .  carvedilol (COREG) 6.25 MG tablet, Take 1 tablet (6.25 mg total) by mouth 2 (two) times daily with a meal., Disp: 60 tablet, Rfl: 3 .  diclofenac sodium (VOLTAREN) 1 % GEL, Apply 4 g topically 4 (four) times daily., Disp: 1 Tube, Rfl: 3 .  docusate sodium (COLACE) 100 MG capsule, Take 100 mg by mouth 2 (two) times daily., Disp: , Rfl:  .  furosemide (LASIX) 40 MG tablet, Take 2 tablets by mouth once daily, Disp: 90 tablet, Rfl: 0 .  glucose blood (ONE TOUCH ULTRA TEST) test strip, Use four times daily before meals and at bedtime. Diagnosis code E11.3419, Disp: 100 each, Rfl: 12 .  ONETOUCH DELICA LANCETS 93Z MISC, 1 strip 4  (four) times daily -  before meals and at bedtime by Does not apply route., Disp: 100 each, Rfl: 11 .  amLODipine (NORVASC) 10 MG tablet, Take 1 tablet (10 mg total) by mouth daily., Disp: 90 tablet, Rfl: 5 .  warfarin (COUMADIN) 4 MG tablet, TAKE 1 & 1/2 (ONE & ONE-HALF) TABLETS BY MOUTH EVERY DAY--EXCEPT THURSDAYS, TAKE TWO (2) TABLETS ON THURSDAYS., Disp: 44 tablet, Rfl: 1 Past Medical History:  Diagnosis Date  . Atrial fibrillation ([place])   . CAD (coronary artery disease)   . Chronic anticoagulation, on coumadin 07/17/2013  . CKD (chronic kidney disease), stage II   . 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 Depression   . Diabetes mellitus type II    Insulin dependent  . GERD (gastroesophageal reflux disease)   . Gout   . Hyperlipidemia   . Hypertension   . Myocardial infarction [facility]) 2000's   "near heart attack" (07/14/2013)  . Paroxysmal atrial fibrillation (HCC)    on coumadin  . Permanent atrial fibrillation ([place]) 07/17/2013  . Shortness of breath    "can happen at any time" (07/14/2013)  . Sleep apnea 07/2010   "has mask at home; seldom uses it" (07/14/2013)  . Stomach ulcer 1950's   "as a teenager"  . Stroke [facility]) ~ 2007; ~ 2009   "memory not as good since" (07/14/2013)  . Tachy-brady syndrome ([place]) 07/17/2013  .  Upper GI bleeding 07/01/2013   Social History   Socioeconomic History  . Marital status: Married    Spouse name: Not on file  . Number of children: Not on file  . Years of education: 5  . Highest education level: Not on file  Occupational History  . 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 Smoking status: Former Smoker    Packs/day: 0.50    Years: 15.00    Pack years: 7.50    Types: Cigarettes    Quit date: 10/26/1976    Years since quitting: 43.9  . Smokeless tobacco: Former Systems developer  . Tobacco comment: 07/14/2013 "quit chewing and dipping in ~ 1970"  Substance and Sexual  Activity  . Alcohol use: No    Alcohol/week: 0.0 standard drinks    Comment: 07/14/2013 "drank a little; quit in ~ 1970; never had problem w/it"  . Drug use: No  . Sexual activity: Not Currently  Other Topics Concern  . Not on file  Social History Narrative   Former smoker - stopped 1978   Alcohol stopped 1970   Full Code      Current Social History 10/07/2019        Patient lives with spouse [name]) and 1 son in a two level home.       Patient's method of transportation is via family member (Son or daughter-in-law).      The highest level of education was 5 th grade.      The patient currently retired Engineer, manufacturing systems.      Identified important Relationships are Wife, sons, and grandchildren       Pets : None       Interests / Fun: Watch TV       Current Stressors: No bedroom downstairs. Has to climb 12 steps to bedroom.       Religious / Personal Beliefs: Christian       L. [place], BSN, RN-BC       Social Determinants of Health   Financial Resource Strain: Not on file  Food Insecurity: Not on file  Transportation Needs: Unmet Transportation Needs  . Lack of Transportation (Medical): Yes  . Lack of Transportation (Non-Medical): Yes  Physical Activity: Inactive  . Days of Exercise per Week: 0 days  . Minutes of Exercise per Session: 0 min  Stress: Not on file  Social Connections: Unknown  . Frequency of Communication with Friends and Family: More than three times a week  . Frequency of Social Gatherings with Friends and Family: Not on file  . Attends Religious Services: More than 4 times per year  . Active Member of Clubs or Organizations: Not on file  . Attends Archivist Meetings: Not on file  . Marital Status: Married   Family History  Problem Relation Age of Onset  . Diabetes Mother   . Hypertension Mother   . Heart Problems Father   . Hypertension Sister   . Diabetes Sister   . Diabetes Sister   . Diabetes Sister   . Cancer Brother      ASSESSMENT Recent Results: The most recent result is correlated with 38 mg per week: Lab Results  Component Value Date   INR 1.1 (A) 10/13/2020   INR 2.0 04/11/2020   INR 1.8 (A) 03/14/2020    Anticoagulation Dosing: Description   Take one-and-one-half (1 and 1/2) of your 57m strength-blue warfarin tablets daily--EXCEPT on THURSDAYS, take two (2) tablets every Thursday.  INR today: Subtherapeutic  PLAN Weekly dose was increased by 16% to 44 mg per week  Patient Instructions  Patient instructed to take medications as defined in the Anti-coagulation Track section of this encounter.  Patient instructed to take today's dose.  Patient instructed to take one-and-one-half (1 and 1/2) of your 22m strength-blue warfarin tablets daily--EXCEPT on THURSDAYS, take two (2) tablets every Thursday.  Patient verbalized understanding of these instructions.    Patient advised to contact clinic or seek medical attention if signs/symptoms of bleeding or thromboembolism occur.  Patient verbalized understanding by repeating back information and was advised to contact me if further medication-related questions arise. Patient was also provided an information handout.  Follow-up Return in 25 days (on 11/07/2020) for Follow up INR.  JPennie Banter PharmD, CPP  15 minutes spent face-to-face with the patient during the encounter. 50% of time spent on education, including signs/sx bleeding and clotting, as well as food and drug interactions with warfarin. 50% of time was spent on fingerprick POC INR sample collection,processing, results determination, and documentation in [email or web address]

## 2020-10-13 NOTE — Progress Notes (Signed)
INTERNAL MEDICINE TEACHING ATTENDING ADDENDUM ° °I agree with pharmacy recommendations as outlined in their note.  ° °-Theresia Pree MD ° °

## 2020-10-13 NOTE — Patient Instructions (Signed)
Patient instructed to take medications as defined in the Anti-coagulation Track section of this encounter.  Patient instructed to take today's dose.  Patient instructed to take one-and-one-half (1 and 1/2) of your 4mg  strength-blue warfarin tablets daily--EXCEPT on THURSDAYS, take two (2) tablets every Thursday.  Patient verbalized understanding of these instructions.

## 2020-10-14 NOTE — Assessment & Plan Note (Signed)
Not currently on statin therapy. Wife refused me to obtain a lipid panel today. Refuses statin therapy as well. With that being said, no change in management at this time.

## 2020-10-14 NOTE — Assessment & Plan Note (Addendum)
He is prescribed 80mg  of lasix daily however, based on my discussion with he and his wife today, it sounds like it is probably closer to 40mg  daily. He appears euvolemic on exam and denies shortness of breath or orthopnea.  Unfortunately, his wife would not allow me to obtain labs on him at today's visit. See more under "essential hypertension" problem from today.   He is not on GDMT. I did not pursue this today as I would not be comfortable starting anything without knowing where his electrolytes and renal function are standing.    Plan -I refilled his lasix today. -I reiterated to him and his wife that he needs to have labs checked however they continue to refuse at today's visit. I am hopeful that they will be agreeable to get this done in the near future.

## 2020-10-14 NOTE — Assessment & Plan Note (Addendum)
Current medications: warfarin, coreg 6.25mg  bid CHADSVASc score is 7 = 11.2% stroke risk per year HAS-BLED score is 5= high risk for major bleeding Follows with Dr. Elie Confer. He has not seen cardiology in several years He is in afib on exam today. HR 67. INR last checked September 2021 at which time INR was 2 INR today is 1.1 Given his high CHADSVASC score, I do feel he needs ongoing anticoagulation however he is also high risk for bleeding. It is unclear to me why he is on warfarin rather than a DOAC. I do not see a contraindication in the chart. Do to his poor medication adherence, poor follow up, and uncontrolled hypertension, I would consider him a poor warfarin candidate however DOAC may be a safer option Plan -he will continue to follow with Dr. Elie Confer -I have reached out to Dr. Elie Confer for collateral information as today was my first visit with him

## 2020-10-14 NOTE — Assessment & Plan Note (Signed)
They refuse an A1C at today's visit. Discussed the importance of monitoring this however did not change their mind.

## 2020-10-14 NOTE — Progress Notes (Signed)
Office Visit   Patient ID: Patrick [name], male    DOB: April 24, 1946, 75 y.o.   MRN: [id number]  Subjective:  CC: chronic hypertension, atrial fibrillation, CKD   75 y.o. presents today for follow up of his chronic medical conditions. Please refer to problem based charting for details of assessment and plan.      ACTIVE MEDICATIONS   Outpatient Medications Prior to Visit  Medication Sig Dispense Refill  . Blood Glucose Monitoring Suppl (ONETOUCH VERIO) w/Device KIT 1 strip 4 (four) times daily by Does not apply route. Pharmacist, if not covered, provide [employer] Relion Meter, please counsel 1 kit 0  . glucose blood (ONE TOUCH ULTRA TEST) test strip Use four times daily before meals and at bedtime. Diagnosis code E11.3419 100 each 12  . ONETOUCH DELICA LANCETS 33H MISC 1 strip 4 (four) times daily -  before meals and at bedtime by Does not apply route. 100 each 11  . amLODipine (NORVASC) 10 MG tablet Take 1 tablet (10 mg total) by mouth daily. 90 tablet 5  . carvedilol (COREG) 6.25 MG tablet Take 1 tablet (6.25 mg total) by mouth 2 (two) times daily with a meal. 60 tablet 3  . diclofenac sodium (VOLTAREN) 1 % GEL Apply 4 g topically 4 (four) times daily. 1 Tube 3  . docusate sodium (COLACE) 100 MG capsule Take 100 mg by mouth 2 (two) times daily.    . furosemide (LASIX) 40 MG tablet Take 2 tablets by mouth once daily 90 tablet 0  . warfarin (COUMADIN) 4 MG tablet TAKE 1 & 1/2 (ONE & ONE-HALF) TABLETS BY MOUTH EVERY MONDAY AND EVERY WEDNESDAY AND EVERY FRIDAY ALL  OTHER  DAYS,TAKE  ONLY  ONE  TABLET 50 tablet 0   No facility-administered medications prior to visit.     Objective:   BP (!) 162/94 (BP Location: Right Arm, Patient Position: Sitting, Cuff Size: Normal)   Pulse 67   Wt 168 lb 1.6 oz (76.2 kg)   SpO2 100%   BMI 27.13 kg/m  Wt Readings from Last 3 Encounters:  10/13/20 168 lb 1.6 oz (76.2 kg)  05/04/20 170 lb 6.4 oz (77.3 kg)  03/11/20 165 lb 11.2 oz (75.2 kg)   BP  Readings from Last 3 Encounters:  10/13/20 (!) 162/94  05/04/20 (!) 156/92  03/11/20 (!) 170/97   Physical exam General: chronically ill appearing, appears older than his stated age Cardiac: irregular rhythm, normal rate, no LE edema Pulm: breathing comfortably on room air. Lungs clear  Health Maintenance:   Health Maintenance  Topic Date Due  . Hepatitis C Screening  Never done  . COVID-19 Vaccine (1) Never done  . PNA vac Low Risk Adult (1 of 2 - PCV13) 11/30/2012  . OPHTHALMOLOGY EXAM  07/01/2015  . LIPID PANEL  11/19/2016  . COLONOSCOPY (Pts 45-69yr Insurance coverage will need to be confirmed)  12/03/2017  . FOOT EXAM  06/05/2019  . INFLUENZA VACCINE  02/28/2020  . HEMOGLOBIN A1C  04/01/2020  . TETANUS/TDAP  11/25/2021  . HPV VACCINES  Aged Out     Assessment & Plan:   Problem List Items Addressed This Visit      Cardiovascular and Mediastinum   Essential hypertension - Primary (Chronic)    Current medications: amlodipine 159mdaily, coreg 6.2539mID, lasix 54m32mily Patient reports medication compliance however on review of the pill bottles that he brought to his visit today, I do not believe he has been taking them appropriately. Several  of his bottles were empty and based on review of medication refill dates, he should have been out of these several months ago. I do think he has been taking the coreg. He has only been taking 99m of the lasix daily. His wife stated that she didn't even want him taking it because her son ended up in the hospital "because of that drug killing his kidneys".   Assessment: Blood pressure is above goal in the office today which appears to be an ongoing issue. BP Readings from Last 3 Encounters:  10/13/20 (!) 162/94  05/04/20 (!) 156/92  03/11/20 (!) 170/97   His wife refused labs today these despite my explaining of the reason for doing so and risks of not obtaining them. As stated above, the wife did not want him on lasix because of  concerns of it harming his kideneys. I explained that lasix does not directly cause harm to the kidneys and that when harm does occur, it is typically from low volume status. I explained that the risk of this occurring is much higher when she does not allow him to have labs drawn. I also explained the risks of uncontrolled hypertension leading to another stroke.  Plan -refills sent to the pharmacy in hopes that he will start taking his medications. -f/u with PCP       Relevant Medications   amLODipine (NORVASC) 10 MG tablet   carvedilol (COREG) 6.25 MG tablet   furosemide (LASIX) 40 MG tablet   Coronary atherosclerosis (Chronic)    Not currently on statin therapy. Wife refused me to obtain a lipid panel today. Refuses statin therapy as well. With that being said, no change in management at this time.      Relevant Medications   amLODipine (NORVASC) 10 MG tablet   carvedilol (COREG) 6.25 MG tablet   furosemide (LASIX) 40 MG tablet   Permanent atrial fibrillation (HCC) (Chronic)    Current medications: warfarin, coreg 6.21mbid CHADSVASc score is 7 = 11.2% stroke risk per year HAS-BLED score is 5= high risk for major bleeding Follows with Dr. GrElie ConferHe has not seen cardiology in several years He is in afib on exam today. HR 67. INR last checked September 2021 at which time INR was 2 INR today is 1.1 Given his high CHADSVASC score, I do feel he needs ongoing anticoagulation however he is also high risk for bleeding. It is unclear to me why he is on warfarin rather than a DOAC. I do not see a contraindication in the chart. Do to his poor medication adherence, poor follow up, and uncontrolled hypertension, I would consider him a poor warfarin candidate however DOAC may be a safer option Plan -he will continue to follow with Dr. GrElie ConferI have reached out to Dr. GrElie Conferor collateral information as today was my first visit with him      Relevant Medications   amLODipine (NORVASC) 10 MG  tablet   carvedilol (COREG) 6.25 MG tablet   furosemide (LASIX) 40 MG tablet   Systolic heart failure ([place]   He is prescribed 8060mf lasix daily however, based on my discussion with he and his wife today, it sounds like it is probably closer to 4m66mily. He appears euvolemic on exam and denies shortness of breath or orthopnea.  Unfortunately, his wife would not allow me to obtain labs on him at today's visit. See more under "essential hypertension" problem from today.   He is not on GDMT. I did not  pursue this today as I would not be comfortable starting anything without knowing where his electrolytes and renal function are standing.    Plan -I refilled his lasix today. -I reiterated to him and his wife that he needs to have labs checked however they continue to refuse at today's visit. I am hopeful that they will be agreeable to get this done in the near future.      Relevant Medications   amLODipine (NORVASC) 10 MG tablet   carvedilol (COREG) 6.25 MG tablet   furosemide (LASIX) 40 MG tablet     Endocrine   Diabetes mellitus ([place])    They refuse an A1C at today's visit. Discussed the importance of monitoring this however did not change their mind.        Genitourinary   Chronic kidney disease (CKD), stage IV (severe) (HCC) (Chronic)    GFR was 28 back in June 2021. That is the most recent BMP we have in our system.  I recommended a referral to nephrology which pt and his wife refused. I highly recommended obtaining a BMP today which pt and his wife refused.        Follow up with PCP -recommend trying to get pt and wife to agree to labs at that time -recommend evaluating cognitive status at that time -recommend goals of care discussion    Pt discussed with Dr. Clista Bernhardt, MD Internal Medicine Resident PGY-2 [facility] Pager: [phone] 10/14/2020 2:01 PM

## 2020-10-14 NOTE — Assessment & Plan Note (Addendum)
Current medications: amlodipine 10mg  daily, coreg 6.25mg  BID, lasix 80mg  daily Patient reports medication compliance however on review of the pill bottles that he brought to his visit today, I do not believe he has been taking them appropriately. Several of his bottles were empty and based on review of medication refill dates, he should have been out of these several months ago. I do think he has been taking the coreg. He has only been taking 40mg  of the lasix daily. His wife stated that she didn't even want him taking it because her son ended up in the hospital "because of that drug killing his kidneys".   Assessment: Blood pressure is above goal in the office today which appears to be an ongoing issue. BP Readings from Last 3 Encounters:  10/13/20 (!) 162/94  05/04/20 (!) 156/92  03/11/20 (!) 170/97   His wife refused labs today these despite my explaining of the reason for doing so and risks of not obtaining them. As stated above, the wife did not want him on lasix because of concerns of it harming his kideneys. I explained that lasix does not directly cause harm to the kidneys and that when harm does occur, it is typically from low volume status. I explained that the risk of this occurring is much higher when she does not allow him to have labs drawn. I also explained the risks of uncontrolled hypertension leading to another stroke.  Plan -refills sent to the pharmacy in hopes that he will start taking his medications. -f/u with PCP

## 2020-10-14 NOTE — Assessment & Plan Note (Signed)
GFR was 28 back in June 2021. That is the most recent BMP we have in our system.  I recommended a referral to nephrology which pt and his wife refused. I highly recommended obtaining a BMP today which pt and his wife refused.

## 2020-10-16 NOTE — Progress Notes (Signed)
Internal Medicine Clinic Attending  Case discussed with Dr. Christian at the time of the visit.  We reviewed the resident's history and exam and pertinent patient test results.  I agree with the assessment, diagnosis, and plan of care documented in the resident's note.  Alexander Raines, M.D., Ph.D.  

## 2020-11-07 ENCOUNTER — Ambulatory Visit: Payer: Medicare Other

## 2020-12-01 ENCOUNTER — Ambulatory Visit (INDEPENDENT_AMBULATORY_CARE_PROVIDER_SITE_OTHER): Payer: Medicare Other | Admitting: Student

## 2020-12-01 ENCOUNTER — Telehealth: Payer: Self-pay

## 2020-12-01 ENCOUNTER — Other Ambulatory Visit: Payer: Self-pay

## 2020-12-01 DIAGNOSIS — M1A0710 Idiopathic chronic gout, right ankle and foot, without tophus (tophi): Secondary | ICD-10-CM

## 2020-12-01 MED ORDER — PREDNISONE 20 MG PO TABS: ORAL_TABLET | ORAL | 0 refills | Status: DC

## 2020-12-01 NOTE — Assessment & Plan Note (Signed)
Patient presents for telehealth appointment with what he believes is a gout flare.  He locates it over the right great toe, erythematous and edematous, painful with any range of motion.  States this is very similar to previous gout flares.  Last one 7 months ago.  Previously treated with prednisone with good result result.  Was previously on allopurinol but taken off due to CKD.  -Start prednisone 40 mg for 5 days followed by taper -Given patient return precautions for in person visit if symptoms do not improve -address allopurinol at next visit.  Was discontinued due to CKD, but with his fairly frequent flares could benefit from allopurinol with a dose reduction (50 mg every other day for his most recent GFR)

## 2020-12-01 NOTE — Telephone Encounter (Signed)
Return call to pt's wife who stated pt is lying down. She stated pt has c/o gout flare-up x 1 week. Requesting mediation; stated he has taken Prednisone before. Agreeable to telehealth appt since pt is having some difficulty walking. Appt schedule with Dr Bridgett Larsson @ 1415 PM - instructed to have telephone nearby.

## 2020-12-01 NOTE — Progress Notes (Signed)
  [facility] Telephone Encounter Continuity Care Appointment  HPI:   This telephone encounter was created for [name] on 12/01/2020 for the following purpose/cc gout.    Past Medical History:  Past Medical History:  Diagnosis Date  . Atrial fibrillation ([place])   . CAD (coronary artery disease)   . Chronic anticoagulation, on coumadin 07/17/2013  . CKD (chronic kidney disease), stage II   . 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 Depression   . Diabetes mellitus type II    Insulin dependent  . GERD (gastroesophageal reflux disease)   . Gout   . Hyperlipidemia   . Hypertension   . Myocardial infarction [facility]) 2000's   "near heart attack" (07/14/2013)  . Paroxysmal atrial fibrillation (HCC)    on coumadin  . Permanent atrial fibrillation ([place]) 07/17/2013  . Shortness of breath    "can happen at any time" (07/14/2013)  . Sleep apnea 07/2010   "has mask at home; seldom uses it" (07/14/2013)  . Stomach ulcer 1950's   "as a teenager"  . Stroke [facility]) ~ 2007; ~ 2009   "memory not as good since" (07/14/2013)  . Tachy-brady syndrome ([place]) 07/17/2013  . Upper GI bleeding 07/01/2013      ROS:      Assessment / Plan / Recommendations:   Please see A&P under problem oriented charting for assessment of the patient's acute and chronic medical conditions.   As always, pt is advised that if symptoms worsen or new symptoms arise, they should go to an urgent care facility or to to ER for further evaluation.   Consent and Medical Decision Making:   Patient discussed with Dr. Evette Doffing  This is a telephone encounter between Patrick [name] and Patrick [name] on 12/01/2020 for gout flare. The visit was conducted with the patient located at home and Patrick [name] at [facility]. The patient's identity was confirmed using their DOB and current address. The patient has consented to being evaluated through a telephone  encounter and understands the associated risks (an examination cannot be done and the patient may need to come in for an appointment) / benefits (allows the patient to remain at home, decreasing exposure to coronavirus). I personally spent 8 minutes on medical discussion.

## 2020-12-01 NOTE — Progress Notes (Signed)
Internal Medicine Clinic Attending  Case discussed with Dr. Chen  At the time of the visit.  We reviewed the resident's history and pertinent patient test results.  I agree with the assessment, diagnosis, and plan of care documented in the resident's note.  

## 2020-12-01 NOTE — Telephone Encounter (Signed)
Is requesting a call back he stated he is having a gout  Flare up for the past week

## 2020-12-13 ENCOUNTER — Telehealth: Payer: Self-pay | Admitting: Internal Medicine

## 2020-12-13 NOTE — Telephone Encounter (Signed)
Patient should be offered another office appointment with our clinic for lab work and management of his chronic condition which could be effecting his kidney function. At that time a provider can discuss referral to nephrology.

## 2020-12-13 NOTE — Telephone Encounter (Signed)
Pt's wife [name]) is now requesting a referral to the Nephrologist.  She states they are able to go now and wishes to have an appointment.  Please advise if a referral can be placed as his last OV in March they Refused but, they have since changed their mind.

## 2020-12-14 NOTE — Telephone Encounter (Signed)
Spoke with the patient and his wife. An appt has been sch for 12/20/2020 to f/u.

## 2020-12-20 ENCOUNTER — Other Ambulatory Visit (HOSPITAL_COMMUNITY): Payer: Self-pay

## 2020-12-20 ENCOUNTER — Ambulatory Visit (INDEPENDENT_AMBULATORY_CARE_PROVIDER_SITE_OTHER): Payer: Medicare Other | Admitting: Internal Medicine

## 2020-12-20 VITALS — BP 184/106 | HR 92 | Temp 98.0°F | Ht 66.0 in

## 2020-12-20 DIAGNOSIS — E113419 Type 2 diabetes mellitus with severe nonproliferative diabetic retinopathy with macular edema, unspecified eye: Secondary | ICD-10-CM | POA: Diagnosis not present

## 2020-12-20 DIAGNOSIS — E785 Hyperlipidemia, unspecified: Secondary | ICD-10-CM

## 2020-12-20 DIAGNOSIS — N184 Chronic kidney disease, stage 4 (severe): Secondary | ICD-10-CM

## 2020-12-20 DIAGNOSIS — I10 Essential (primary) hypertension: Secondary | ICD-10-CM | POA: Diagnosis not present

## 2020-12-20 DIAGNOSIS — I6322 Cerebral infarction due to unspecified occlusion or stenosis of basilar arteries: Secondary | ICD-10-CM

## 2020-12-20 DIAGNOSIS — I63219 Cerebral infarction due to unspecified occlusion or stenosis of unspecified vertebral arteries: Secondary | ICD-10-CM | POA: Diagnosis not present

## 2020-12-20 DIAGNOSIS — M109 Gout, unspecified: Secondary | ICD-10-CM

## 2020-12-20 DIAGNOSIS — IMO0002 Reserved for concepts with insufficient information to code with codable children: Secondary | ICD-10-CM

## 2020-12-20 DIAGNOSIS — E1165 Type 2 diabetes mellitus with hyperglycemia: Secondary | ICD-10-CM

## 2020-12-20 DIAGNOSIS — E118 Type 2 diabetes mellitus with unspecified complications: Secondary | ICD-10-CM | POA: Diagnosis not present

## 2020-12-20 LAB — POCT GLYCOSYLATED HEMOGLOBIN (HGB A1C): Hemoglobin A1C: 7.1 % — AB (ref 4.0–5.6)

## 2020-12-20 LAB — GLUCOSE, CAPILLARY: Glucose-Capillary: 122 mg/dL — ABNORMAL HIGH (ref 70–99)

## 2020-12-20 MED ORDER — PREDNISONE 20 MG PO TABS
ORAL_TABLET | ORAL | 0 refills | Status: AC
Start: 2020-12-20 — End: 2021-01-01
  Filled 2020-12-20: qty 16, 11d supply, fill #0

## 2020-12-20 MED ORDER — CARVEDILOL 6.25 MG PO TABS
6.2500 mg | ORAL_TABLET | Freq: Two times a day (BID) | ORAL | 1 refills | Status: DC
  Filled 2020-12-20: qty 90, 45d supply, fill #0

## 2020-12-20 NOTE — Patient Instructions (Signed)
Thank you for trusting me with your care. To recap, today we discussed the following:  1. Chronic kidney disease (CKD), stage IV (severe) (HCC)  - CMP14 + Anion Gap  2. Essential hypertension  - CMP14 + Anion Gap - carvedilol (COREG) 6.25 MG tablet; Take 1 tablet (6.25 mg total) by mouth 2 (two) times daily with a meal.  Dispense: 90 tablet; Refill: 1  3. Hyperlipidemia, unspecified hyperlipidemia type  - Lipid Profile  4. Type 2 diabetes mellitus with severe nonproliferative retinopathy and macular edema, without long-term current use of insulin, unspecified laterality (HCC)  - Hemoglobin a1c   5. Acute gout of foot, unspecified cause, unspecified laterality  - Uric acid - predniSONE (DELTASONE) 20 MG tablet; Take 2 tablets (40 mg total) by mouth daily with breakfast for 5 days, THEN 1.5 tablets (30 mg total) daily with breakfast for 2 days, THEN 1 tablet (20 mg total) daily with breakfast for 2 days, THEN 0.5 tablets (10 mg total) daily with breakfast for 2 days.  Dispense: 16 tablet; Refill: 0

## 2020-12-20 NOTE — Progress Notes (Signed)
   CC: CKD, Hypertension, Hyperlipidemia, Type 2 Diabetes Mellitus, Gout  HPI:[name] is a 75 y.o. male who presents for evaluation of CKD, HTN, HLD, T2DM, and Gout. Please see individual problem based A/P for details.   Past Medical History:  Diagnosis Date  . Atrial fibrillation ([place])   . CAD (coronary artery disease)   . Chronic anticoagulation, on coumadin 07/17/2013  . CKD (chronic kidney disease), stage II   . 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 Depression   . Diabetes mellitus type II    Insulin dependent  . GERD (gastroesophageal reflux disease)   . Gout   . Hyperlipidemia   . Hypertension   . Myocardial infarction [facility]) 2000's   "near heart attack" (07/14/2013)  . Paroxysmal atrial fibrillation (HCC)    on coumadin  . Permanent atrial fibrillation ([place]) 07/17/2013  . Shortness of breath    "can happen at any time" (07/14/2013)  . Sleep apnea 07/2010   "has mask at home; seldom uses it" (07/14/2013)  . Stomach ulcer 1950's   "as a teenager"  . Stroke [facility]) ~ 2007; ~ 2009   "memory not as good since" (07/14/2013)  . Tachy-brady syndrome ([place]) 07/17/2013  . Upper GI bleeding 07/01/2013   Review of Systems:   Review of Systems  Constitutional: Negative for chills and fever.  Genitourinary: Negative for dysuria, flank pain and frequency.     Physical Exam: Vitals:   12/20/20 1525  BP: (!) 184/106  Pulse: 92  Temp: 98 F (36.7 C)  TempSrc: Oral  SpO2: 100%  Height: 5\' 6"  (1.676 m)   General: Chronically ill appearing, well kept HEENT: Conjunctiva nl , antiicteric sclerae, moist mucous membranes, no exudate or erythema Cardiovascular: irregular irregular pulse, normal JVP Pulmonary : Equal breath sounds, No wheezes, rales, or rhonchi Abdominal: soft, nontender,  bowel sounds present Ext: Bilateral painful great toe, mild erythema and warmth  Assessment & Plan:   See Encounters Tab for problem  based charting.  Patient discussed with Dr. Angelia Mould

## 2020-12-21 ENCOUNTER — Other Ambulatory Visit (HOSPITAL_COMMUNITY): Payer: Self-pay

## 2020-12-21 LAB — CMP14 + ANION GAP
ALT: 10 IU/L (ref 0–44)
AST: 15 IU/L (ref 0–40)
Albumin/Globulin Ratio: 1.4 (ref 1.2–2.2)
Albumin: 3.8 g/dL (ref 3.7–4.7)
Alkaline Phosphatase: 130 IU/L — ABNORMAL HIGH (ref 44–121)
Anion Gap: 17 mmol/L (ref 10.0–18.0)
BUN/Creatinine Ratio: 14 (ref 10–24)
BUN: 31 mg/dL — ABNORMAL HIGH (ref 8–27)
Bilirubin Total: 0.7 mg/dL (ref 0.0–1.2)
CO2: 19 mmol/L — ABNORMAL LOW (ref 20–29)
Calcium: 8.7 mg/dL (ref 8.6–10.2)
Chloride: 105 mmol/L (ref 96–106)
Creatinine, Ser: 2.27 mg/dL — ABNORMAL HIGH (ref 0.76–1.27)
Globulin, Total: 2.7 g/dL (ref 1.5–4.5)
Glucose: 125 mg/dL — ABNORMAL HIGH (ref 65–99)
Potassium: 4.3 mmol/L (ref 3.5–5.2)
Sodium: 141 mmol/L (ref 134–144)
Total Protein: 6.5 g/dL (ref 6.0–8.5)
eGFR: 30 mL/min/{1.73_m2} — ABNORMAL LOW (ref >59)

## 2020-12-21 LAB — LIPID PANEL
Chol/HDL Ratio: 3.3 ratio (ref 0.0–5.0)
Cholesterol, Total: 204 mg/dL — ABNORMAL HIGH (ref 100–199)
HDL: 62 mg/dL (ref >39)
LDL Chol Calc (NIH): 121 mg/dL — ABNORMAL HIGH (ref 0–99)
Triglycerides: 118 mg/dL (ref 0–149)
VLDL Cholesterol Cal: 21 mg/dL (ref 5–40)

## 2020-12-21 LAB — URIC ACID: Uric Acid: 7.5 mg/dL (ref 3.8–8.4)

## 2020-12-23 ENCOUNTER — Telehealth: Payer: Self-pay

## 2020-12-23 DIAGNOSIS — M109 Gout, unspecified: Secondary | ICD-10-CM

## 2020-12-23 NOTE — Telephone Encounter (Signed)
Requesting lab results, please call back.  

## 2020-12-25 ENCOUNTER — Encounter: Payer: Self-pay | Admitting: Internal Medicine

## 2020-12-25 NOTE — Assessment & Plan Note (Signed)
Patient reports his gout flare up after finished last prednisone taper. He has had multiple flares this past year.  Assessment/Plan: Acute gout of foot, unspecified cause, unspecified laterality. Checking Uric acid and will discuss starting allopurinol if elevated.  - Uric acid - predniSONE (DELTASONE) 20 MG tablet; Take 2 tablets (40 mg total) by mouth daily with breakfast for 5 days, THEN 1.5 tablets (30 mg total) daily with breakfast for 2 days, THEN 1 tablet (20 mg total) daily with breakfast for 2 days, THEN 0.5 tablets (10 mg total) daily with breakfast for 2 days.  Dispense: 16 tablet; Refill: 0

## 2020-12-25 NOTE — Assessment & Plan Note (Signed)
Hx of multiple strokes and MI.  Assessment/Plan: Hyperlipidemia - Lipid Profile

## 2020-12-25 NOTE — Assessment & Plan Note (Signed)
Longstanding history of CKD. Last seen at [facility] in 2019 to my knowledge. CKD likely from longstanding HTN and T2DM.   Assessment/Plan: Chronic kidney disease (CKD), stage IV (severe) ([place]). Stable Cr. Overall I do not believe [email or web address] would be a great candidate for hemodialysis. He has multiple comorbidities and non compliant with medications according to his wife. We will work to manage his risk factors for progression to ESRD. He can benefit from SGLT2. Will have patient follow up in 1 months, can consider referral at that time.   - CMP14 + Anion Gap

## 2020-12-25 NOTE — Assessment & Plan Note (Signed)
Patient's BP today is 184/106 with a goal of <130/80. The patient has not taken any of his medications today.   Assessment/Plan: Uncontrolled HTN. Non compliant on medications for multiple years per chart . Patient wife endorses he does not like to take his medications. Reinforced uncontrolled HTN puts patient at risk for progression to ESRD and recurrent strokes.  Will place order for CCM, discussed this with patient and his wife. Checking BMP today. Considered starting ARB, but patient will need to demonstrated routine follow up can be established before prescribing.  -Continue amlodipine 10 mg daily  - CMP14 + Anion Gap - carvedilol (COREG) 6.25 MG tablet; Take 1 tablet (6.25 mg total) by mouth 2 (two) times daily with a meal.  Dispense: 90 tablet; Refill: 1 - CCM - Follow up in one month. If patient demonstrates compliance consider starting ARB .

## 2020-12-25 NOTE — Assessment & Plan Note (Signed)
Not currently on any medications for diabetes.  -  Hemoglobin A1c

## 2020-12-26 ENCOUNTER — Encounter: Payer: Self-pay | Admitting: *Deleted

## 2020-12-29 MED ORDER — ALLOPURINOL 100 MG PO TABS: 50.0000 mg | ORAL_TABLET | ORAL | 1 refills | Status: DC

## 2020-12-29 NOTE — Telephone Encounter (Signed)
Called patient a few days ago and he was in the store. He could not finish our discussion and ask me to call back.   Called patient today to review lab work. Patient has history of MI. Will target LDL<70. LDL 121. Discussed and patient does not want to start statin at this time. Will ask next physician to readdress at upcoming visit.   In addition Uric Acid elevated in setting of acute gout flair, 7.5. Will prescribe allopurinol , adjusted for GFR. I will  ask patient to start this medication toward the end of his steroid taper.  Patient will need repeat uric acid and lipid panel at his follow up visit in 4-6 weeks. Kidney function is stable and patient hypertensive on recent office visit. Patient will need further management of blood pressure at next visit. Also suggest starting SGLT-2 to patient and they will review this medication. Patient does need a update urine microalbumine/creatine.  This will also need addressing at next visit.   Patrick [name] and his wife are living with their son at this time. They do not describe this as a permanent move , but one of financial necessity. I have referred the patient to CCM .

## 2020-12-29 NOTE — Telephone Encounter (Signed)
Please have him follow up in the office to discuss results so we can discuss management options. This can also be done as a televisit if preferred.

## 2021-01-12 ENCOUNTER — Encounter: Payer: Medicare Other | Admitting: Pharmacist

## 2021-01-12 ENCOUNTER — Other Ambulatory Visit: Payer: Self-pay

## 2021-01-12 ENCOUNTER — Other Ambulatory Visit: Payer: Self-pay | Admitting: Pharmacist

## 2021-01-12 ENCOUNTER — Ambulatory Visit (INDEPENDENT_AMBULATORY_CARE_PROVIDER_SITE_OTHER): Payer: Medicare Other | Admitting: Internal Medicine

## 2021-01-12 ENCOUNTER — Encounter: Payer: Self-pay | Admitting: Internal Medicine

## 2021-01-12 DIAGNOSIS — I10 Essential (primary) hypertension: Secondary | ICD-10-CM | POA: Diagnosis not present

## 2021-01-12 DIAGNOSIS — N184 Chronic kidney disease, stage 4 (severe): Secondary | ICD-10-CM | POA: Diagnosis not present

## 2021-01-12 DIAGNOSIS — Z7901 Long term (current) use of anticoagulants: Secondary | ICD-10-CM

## 2021-01-12 DIAGNOSIS — I4821 Permanent atrial fibrillation: Secondary | ICD-10-CM

## 2021-01-12 DIAGNOSIS — I5022 Chronic systolic (congestive) heart failure: Secondary | ICD-10-CM

## 2021-01-12 NOTE — Patient Instructions (Signed)
I recommend you restart the lasix to avoid fluid building up on you. If you decide to start taking this, please return to the clinic for a visit in 2 weeks so we can recheck your volume status and labs.

## 2021-01-13 NOTE — Assessment & Plan Note (Addendum)
Blood pressure is above goal in the office today. He reports compliance with amlodipine and coreg. He has not been taking the lasix for a while (timeframe unclear) Discussed recommendations to resume lasix as he appears volume overloaded. Pt and his wife refused all recommendations at today's visit, including resuming lasix, as stated by his wife because "that medicine is going to ruin his kidneys". Provided education regarding this, similarly to our discussion back in March.  *See further notes under heart failure*

## 2021-01-13 NOTE — Progress Notes (Signed)
 Office Visit   Patient ID: Patrick [name], male    DOB: 07/07/1946, 75 y.o.   MRN: [id number]   PCP: [name], Elhamalsadat, MD   Subjective:  CC: chronic hypertension, heart failure, afib  Patrick [name] is a 75 y.o. year old male who presents for follow up for his chronic medical conditions. Please refer to problem based charting for assessment and plan.      ACTIVE MEDICATIONS   Outpatient Medications Prior to Visit  Medication Sig Dispense Refill   allopurinol (ZYLOPRIM) 100 MG tablet Take 0.5 tablets (50 mg total) by mouth every other day. 30 tablet 1   amLODipine (NORVASC) 10 MG tablet Take 1 tablet (10 mg total) by mouth daily. 90 tablet 0   Blood Glucose Monitoring Suppl (ONETOUCH VERIO) w/Device KIT 1 strip 4 (four) times daily by Does not apply route. Pharmacist, if not covered, provide [employer] Rel[employer] Meter, please counsel 1 kit 0   carvedilol (COREG) 6.25 MG tablet Take 1 tablet (6.25 mg total) by mouth 2 (two) times daily with a meal. 90 tablet 1   furosemide (LASIX) 40 MG tablet Take 2 tablets (80 mg total) by mouth daily. 180 tablet 0   glucose blood (ONE TOUCH ULTRA TEST) test strip Use four times daily before meals and at bedtime. Diagnosis code E11.3419 100 each 12   ONETOUCH DELICA LANCETS 33G MISC 1 strip 4 (four) times daily -  before meals and at bedtime by Does not apply route. 100 each 11   warfarin (COUMADIN) 4 MG tablet TAKE 1 & 1/2 (ONE & ONE-HALF) TABLETS BY MOUTH EVERY DAY--EXCEPT THURSDAYS, TAKE TWO (2) TABLETS ON THURSDAYS. 44 tablet 1   No facility-administered medications prior to visit.     Objective:   BP (!) 165/69 (BP Location: Right Arm, Patient Position: Sitting, Cuff Size: Normal)   Pulse 65   Temp 98.4 F (36.9 C) (Oral)   Ht 5' 6" (1.676 m)   Wt 176 lb 3.2 oz (79.9 kg)   SpO2 100% Comment: RA  BMI 28.44 kg/m  Wt Readings from Last 3 Encounters:  01/12/21 176 lb 3.2 oz (79.9 kg)  10/13/20 168 lb 1.6 oz (76.2 kg)  05/04/20 170 lb 6.4 oz  (77.3 kg)   BP Readings from Last 3 Encounters:  01/12/21 (!) 165/69  12/20/20 (!) 184/106  10/13/20 (!) 162/94   Physical exam General: frail, chronically ill gentleman sitting in chair Cardiac: irregular rhythm, regular rate, +4 edema in the bilateral lower extremities.  Pulm: breathing comfortably on room air. Psych: PHQ 0. quiet, somewhat flat affect. Non-linear thought processing.   Health Maintenance:   Health Maintenance  Topic Date Due   COVID-19 Vaccine (1) Never done   Hepatitis C Screening  Never done   Zoster Vaccines- Shingrix (1 of 2) Never done   PNA vac Low Risk Adult (1 of 2 - PCV13) 11/30/2012   OPHTHALMOLOGY EXAM  07/01/2015   COLONOSCOPY (Pts 45-49yrs Insurance coverage will need to be confirmed)  12/03/2017   FOOT EXAM  06/05/2019   INFLUENZA VACCINE  02/27/2021   HEMOGLOBIN A1C  03/22/2021   TETANUS/TDAP  11/25/2021   LIPID PANEL  12/20/2021   HPV VACCINES  Aged Out     Assessment & Plan:   Problem List Items Addressed This Visit       Cardiovascular and Mediastinum   Essential hypertension (Chronic)    Blood pressure is above goal in the office today. He reports compliance with amlodipine and coreg. He   has not been taking the lasix for a while (timeframe unclear) Discussed recommendations to resume lasix as he appears volume overloaded. Pt and his wife refused all recommendations at today's visit, including resuming lasix, as stated by his wife because "that medicine is going to ruin his kidneys". Provided education regarding this, similarly to our discussion back in March.  *See further notes under heart failure*       Permanent atrial fibrillation (HCC) (Chronic)    CHA2DS2-VASc Score 7 (11.2% stroke risk/year); HAS-BLED score 5 (suggested risk >10%)  Anticoagulation: warfarin Rate control: coreg 6.25mg twice daily  Follows with James Groce in the coumadin clinic. Unfortunately, pt does not follow up appropriately for INR checks. Last INR  was 1.1 in March.  His poor follow up has been an ongoing issue. Over the past 24 months, his INR has been checked a total of 8 times. Unfortunately, this creates a high risk for adverse events.   Review of the pharmacy dispense report indicates that he is not compliant with anticoagulation. Last dispense was 4/28 for 30 warfarin tablets. Based on the recommended dose from the last anticoagulation visit, 30 tablets should be lasting just short of 3 weeks, suggesting he has not taken it for the past 4 weeks.  I recommended checking an INR at today's visit, however this was declined by pt and his wife.  Assessment: at this time, I do not think he is a good candidate for warfarin therapy due to poor follow up and poor medication compliance. I discussed the above concerns with James Groce, who feels similarly. DOAC use in CKD stages >3 (pt has CKD IV) has been controversial however is thought to be reasonable within some society guidelines.In Sulaiman's case however, his HAS-BLED score indicates a very high risk of bleeding. His blood pressure is fairly uncontrolled and he is overall frail, increasing his risk for bleeding. Furthermore, it would still require compliance to be effective.  Plan -no further warfarin refills at this time -should patient and his wife wish to revisit this discussion, I would recommend a multidisciplinary meeting involving the patient, his wife, pharmacy, and resident or attending.       Systolic heart failure (HCC)    Appears grossly volume overloaded on exam today. He is supposed to be on lasix 80mg daily but has not been taking it. Weight is up 8# since March. His wife notes that he was told by another doctor in our clinic not to take it, however I'm not able to find that documentation. I encouraged him to resume the lasix however his wife refused this today. I explained the benefits and risks of taking lasix and of not taking it. His wife maintains "that medicine is gonna  ruin his kidneys", also saying "that medicine sounds dangerous", and "that medicine is toxic". Education provided.  Despite a lengthy discussion at today's visit, pt's wife maintains that she "won't be having him take that toxic medicine". I attempted to engage him in conversation however he just looked at me, and then his wife starting talking again.  There are many barriers involved here.  1. Both he and his wife (her report) have had strokes in the past. There is some type of cognitive deficit going on for both he and his wife; whether that be secondary to the strokes, other organic causes, or very poor health literacy remains unclear but likely a combination. 2. His wife seems to be the primary person involved in his healthcare, and has been present   at both visits, in March and today. Unfortunately, it's difficult to converse with Manolo due to her interjections into the conversation.  Finas was referred to Rachelle Kelley, our clinical pharmacist, who planned to meet with them today. Unfortunately, when Rachelle went in to chat with them, Aryaan's wife said "he's fine" and declined the visit. He had previously been referred to our embedded [employer] case manager, however, based on the note from January, it appears that his wife declined further interactions with their services.  Ultimately, I explained to them that part of my role is to give recommendations however, as long as they are informed of the risks and benefits, the decision on what they do with said recommendations lies on them.  Plan -recommend that he resume lasix and return in a couple weeks for re-evaluation and lab recheck.          Genitourinary   Chronic kidney disease (CKD), stage IV (severe) (HCC) (Chronic)    Discussed referral to nephrology. Pt and his wife decline. At this time, should he develop full renal failure, I would not find him a good candidate for HD due poor compliance.         Return in about 2 weeks (around  01/26/2021). Recommend multi-disciplinary involving Dr. Groce and resident/attending.    Pt discussed with Dr. Williams   , MD Internal Medicine Resident PGY-2 [facility] Pager: #[phone] 01/13/2021 2:18 PM     

## 2021-01-13 NOTE — Assessment & Plan Note (Addendum)
Appears grossly volume overloaded on exam today. He is supposed to be on lasix 80mg  daily but has not been taking it. Weight is up 8# since March. His wife notes that he was told by another doctor in our clinic not to take it, however I'm not able to find that documentation. I encouraged him to resume the lasix however his wife refused this today. I explained the benefits and risks of taking lasix and of not taking it. His wife maintains "that medicine is gonna ruin his kidneys", also saying "that medicine sounds dangerous", and "that medicine is toxic". Education provided.  Despite a lengthy discussion at today's visit, pt's wife maintains that she "won't be having him take that toxic medicine". I attempted to engage him in conversation however he just looked at me, and then his wife starting talking again.  There are many barriers involved here.  1. Both he and his wife (her report) have had strokes in the past. There is some type of cognitive deficit going on for both he and his wife; whether that be secondary to the strokes, other organic causes, or very poor health literacy remains unclear but likely a combination. 2. His wife seems to be the primary person involved in his healthcare, and has been present at both visits, in March and today. Unfortunately, it's difficult to converse with Patrick [name] due to her interjections into the conversation.  Patrick [name] was referred to Hughes Better, our clinical pharmacist, who planned to meet with them today. Unfortunately, when [name] went in to chat with them, [name]'s wife said "he's fine" and declined the visit. He had previously been referred to our embedded [facility] case manager, however, based on the note from January, it appears that his wife declined further interactions with their services.  Ultimately, I explained to them that part of my role is to give recommendations however, as long as they are informed of the risks and benefits, the decision on what they do  with said recommendations lies on them.  Plan -recommend that he resume lasix and return in a couple weeks for re-evaluation and lab recheck.

## 2021-01-13 NOTE — Assessment & Plan Note (Addendum)
Discussed referral to nephrology. Pt and his wife decline. At this time, should he develop full renal failure, I would not find him a good candidate for HD due poor compliance.

## 2021-01-13 NOTE — Assessment & Plan Note (Addendum)
CHA2DS2-VASc Score 7 (11.2% stroke risk/year); HAS-BLED score 5 (suggested risk >10%)  Anticoagulation: warfarin Rate control: coreg 6.25mg  twice daily  Follows with Patrick [name] in the coumadin clinic. Unfortunately, pt does not follow up appropriately for INR checks. Last INR was 1.1 in March.  His poor follow up has been an ongoing issue. Over the past 24 months, his INR has been checked a total of 8 times. Unfortunately, this creates a high risk for adverse events.   Review of the pharmacy dispense report indicates that he is not compliant with anticoagulation. Last dispense was 4/28 for 30 warfarin tablets. Based on the recommended dose from the last anticoagulation visit, 30 tablets should be lasting just short of 3 weeks, suggesting he has not taken it for the past 4 weeks.  I recommended checking an INR at today's visit, however this was declined by pt and his wife.  Assessment: at this time, I do not think he is a good candidate for warfarin therapy due to poor follow up and poor medication compliance. I discussed the above concerns with Patrick [name], who feels similarly. DOAC use in CKD stages >3 (pt has CKD IV) has been controversial however is thought to be reasonable within some society guidelines.In Patrick [name] case however, his HAS-BLED score indicates a very high risk of bleeding. His blood pressure is fairly uncontrolled and he is overall frail, increasing his risk for bleeding. Furthermore, it would still require compliance to be effective.  Plan -no further warfarin refills at this time -should patient and his wife wish to revisit this discussion, I would recommend a multidisciplinary meeting involving the patient, his wife, pharmacy, and resident or attending.

## 2021-01-23 ENCOUNTER — Telehealth: Payer: Self-pay | Admitting: Pharmacist

## 2021-01-23 NOTE — Telephone Encounter (Signed)
Called patient/patient's wife. Indicated he must come to the clinic for an INR check. Would not come to clinic today. Next Monday is 4-JUL-22. Patient/wife states per phone that he/they will come at 2:00 pm on Monday, 11-JUL-22.

## 2021-01-26 NOTE — Progress Notes (Signed)
Internal Medicine Clinic Attending  Case discussed with Dr. Darrick Meigs  At the time of the visit.  We reviewed the resident's history and exam and pertinent patient test results.  I agree with the assessment, diagnosis, and plan of care documented in the resident's note. Very challenging situation here, appropriate for clinic leadership to address regarding appropriateness for our clinic.  Until this is formally completed, he will continue to receive care with [name] at [facility].

## 2021-01-31 ENCOUNTER — Encounter: Payer: Self-pay | Admitting: *Deleted

## 2021-02-06 ENCOUNTER — Ambulatory Visit: Payer: Medicare Other

## 2021-03-03 DIAGNOSIS — R5383 Other fatigue: Secondary | ICD-10-CM | POA: Diagnosis not present

## 2021-03-03 DIAGNOSIS — Z6822 Body mass index (BMI) 22.0-22.9, adult: Secondary | ICD-10-CM | POA: Diagnosis not present

## 2021-03-03 DIAGNOSIS — R030 Elevated blood-pressure reading, without diagnosis of hypertension: Secondary | ICD-10-CM | POA: Diagnosis not present

## 2021-03-03 DIAGNOSIS — E538 Deficiency of other specified B group vitamins: Secondary | ICD-10-CM | POA: Diagnosis not present

## 2021-03-09 ENCOUNTER — Other Ambulatory Visit: Payer: Self-pay | Admitting: Internal Medicine

## 2021-03-09 DIAGNOSIS — I10 Essential (primary) hypertension: Secondary | ICD-10-CM

## 2021-03-13 ENCOUNTER — Telehealth (HOSPITAL_COMMUNITY): Payer: Self-pay | Admitting: *Deleted

## 2021-03-13 DIAGNOSIS — I482 Chronic atrial fibrillation, unspecified: Secondary | ICD-10-CM | POA: Diagnosis not present

## 2021-03-13 NOTE — Telephone Encounter (Signed)
This Probation officer received a call from [facility] . Needed Coumadin level. Info that a fax was sent  to [facility] to request of release of information on 8-11 and 8-12 . This Probation officer unable to find release of info fax. Romelle Starcher inform last time a result for Coumadin level was 10-13-2020. They can refax a release of information if needed.

## 2021-03-14 ENCOUNTER — Encounter: Payer: Self-pay | Admitting: Student

## 2021-03-14 ENCOUNTER — Other Ambulatory Visit: Payer: Self-pay

## 2021-03-14 ENCOUNTER — Ambulatory Visit (INDEPENDENT_AMBULATORY_CARE_PROVIDER_SITE_OTHER): Payer: Medicare Other | Admitting: Student

## 2021-03-14 VITALS — BP 177/98 | HR 60 | Temp 98.1°F | Ht 66.5 in | Wt 159.0 lb

## 2021-03-14 DIAGNOSIS — N184 Chronic kidney disease, stage 4 (severe): Secondary | ICD-10-CM

## 2021-03-14 DIAGNOSIS — E0800 Diabetes mellitus due to underlying condition with hyperosmolarity without nonketotic hyperglycemic-hyperosmolar coma (NKHHC): Secondary | ICD-10-CM

## 2021-03-14 DIAGNOSIS — I4821 Permanent atrial fibrillation: Secondary | ICD-10-CM

## 2021-03-14 DIAGNOSIS — I10 Essential (primary) hypertension: Secondary | ICD-10-CM | POA: Diagnosis not present

## 2021-03-14 DIAGNOSIS — I5020 Unspecified systolic (congestive) heart failure: Secondary | ICD-10-CM

## 2021-03-14 NOTE — Assessment & Plan Note (Signed)
Last A1c in May was 7.1, can be falsely low in the setting of CKD 4.  -No metformin in the setting of CKD 4 -Can consider starting an SGLT2 for heart failure and CKD.

## 2021-03-14 NOTE — Progress Notes (Signed)
   CC: Follow-up on anticoagulation therapy  HPI:  [name] is a 75 y.o. with past medical history of CKD 4, atrial fibrillation, HFrEF who presented to clinic today to follow-up on anticoagulation therapy.  Patient is here with his wife and his son.  Please see problem based charting for details  Past Medical History:  Diagnosis Date   Atrial fibrillation ([place])    CAD (coronary artery disease)    Chronic anticoagulation, on coumadin 07/17/2013   CKD (chronic kidney disease), stage II    CVA (cerebral infarction) 11/22/10   Thalamic with residual memory loss and slow speech   Depression    Diabetes mellitus type II    Insulin dependent   GERD (gastroesophageal reflux disease)    Gout    Hyperlipidemia    Hypertension    Myocardial infarction ([place]) 2000's   "near heart attack" (07/14/2013)   Paroxysmal atrial fibrillation ([facility])    on coumadin   Permanent atrial fibrillation ([place]) 07/17/2013   Shortness of breath    "can happen at any time" (07/14/2013)   Sleep apnea 07/2010   "has mask at home; seldom uses it" (07/14/2013)   Stomach ulcer 1950's   "as a teenager"   Stroke ([place]) ~ 2007; ~ 2009   "memory not as good since" (07/14/2013)   Tachy-brady syndrome ([place]) 07/17/2013   Upper GI bleeding 07/01/2013   Review of Systems:    Per HPI  Physical Exam:  Vitals:   03/14/21 1426  BP: (!) 177/98  Pulse: 60  Temp: 98.1 F (36.7 C)  TempSrc: Oral  SpO2: 100%  Weight: 159 lb (72.1 kg)  Height: 5' 6.5" (1.689 m)   Physical Exam Constitutional:      General: He is not in acute distress.    Appearance: He is not toxic-appearing.  HENT:     Head: Normocephalic.  Cardiovascular:     Rate and Rhythm: Normal rate and regular rhythm.     Comments: Slightly prominent JVD +2 edema above the sock line Pulmonary:     Effort: Pulmonary effort is normal. No respiratory distress.     Breath sounds: Normal breath sounds. No wheezing.  Skin:    General: Skin is  warm.     Coloration: Skin is not jaundiced.  Neurological:     Mental Status: He is alert.  Psychiatric:        Mood and Affect: Mood normal.        Behavior: Behavior normal.     Assessment & Plan:   See Encounters Tab for problem based charting.  Patient discussed with Dr. Jimmye Norman

## 2021-03-14 NOTE — Patient Instructions (Signed)
Patrick [name],  It was a pleasure seeing you in the clinic today.  Here is a summary what we talked about:  1.  Warfarin: Please set up an appointment with [facility] for primary care doctor as soon as you can.  They can help you set up for INR checks.  We will send over the records once we get the release of information form.   2.  Chronic kidney disease: I putting a referral for a kidney doctor.  3.  High blood pressure: Your blood pressure is very elevated today.  Please continue taking your medications.  The kidney doctor can also help managing your blood pressure.  Take care,  Dr. Alfonse Spruce

## 2021-03-14 NOTE — Assessment & Plan Note (Signed)
Volume status appears improved compared to last visit.  His weight came down to 159.  His respiratory status is normal.  Patient denies chest pain or shortness of breath.  Last echocardiogram was in 2017 which show EF 40-55%.  Last visit with cardiology in 2014.  -Continue amlodipine and Coreg -Patient is not taking Lasix due to per family preference -Will need cardiology follow-up after establish with a new PCP

## 2021-03-14 NOTE — Assessment & Plan Note (Signed)
Patient is here with his wife and his son to discuss issue with anticoagulation.  During the last several visits with Dr. Darrick Meigs, warfarin was discontinued due to inconsistent INR checked and nonadherence to warfarin.  This history was discussed with Dr. Johney Maine and he agreed with holding warfarin.  His wife voiced concern about patient did not have refill for warfarin.  I explained the risk of not monitoring INR when taking warfarin.  His wife did not seem to understand the importance of INR monitoring.  His son questioned if patient can be on DOAC.  I explained to him the barrier of DOAC for this patient includes CKD 4, high risk of bleeding (HASBLED of 5) and issue with nonadherence.  His wife states that patient takes care of his own medications but he could not recall what medications that he was taking when asked.   After the discussion, the decision was made to transfer his care to [facility].    I printed out the results of his last INR in March to give to PCP at [facility].  We will transfer his record there after we receive the release of information form.

## 2021-03-14 NOTE — Assessment & Plan Note (Signed)
CKD 4 likely secondary to hypertension.  Last BMP checked shows stable creatinine  -Nephrology referral for CKD 4 and assistance with blood pressure control

## 2021-03-14 NOTE — Assessment & Plan Note (Signed)
Initial blood pressure 177/98.  Patient reports adherence to his medication but did not take his medication this morning.  His wife states that his blood pressure was good yesterday with systolic in the 233.  I explained to family that his blood pressure was elevated in several last office visits.  He was not on ACE or ARB due to CKD 4.  Patient was put on Lasix but is not taking it due to family preference.  -Referral to nephrology for CKD 4 assistance with blood pressure control -Continue amlodipine 10 mg -Continue Coreg 6.25 mg twice daily.  Cannot increase the dose given heart rate is borderline low

## 2021-03-15 NOTE — Progress Notes (Signed)
Internal Medicine Clinic Attending  Case discussed with Dr. Nguyen  At the time of the visit.  We reviewed the resident's history and exam and pertinent patient test results.  I agree with the assessment, diagnosis, and plan of care documented in the resident's note. 

## 2021-05-08 DIAGNOSIS — Z79899 Other long term (current) drug therapy: Secondary | ICD-10-CM | POA: Diagnosis not present

## 2021-05-08 DIAGNOSIS — R5383 Other fatigue: Secondary | ICD-10-CM | POA: Diagnosis not present

## 2021-05-08 DIAGNOSIS — E559 Vitamin D deficiency, unspecified: Secondary | ICD-10-CM | POA: Diagnosis not present

## 2021-05-08 DIAGNOSIS — Z1211 Encounter for screening for malignant neoplasm of colon: Secondary | ICD-10-CM | POA: Diagnosis not present

## 2021-05-08 DIAGNOSIS — E7800 Pure hypercholesterolemia, unspecified: Secondary | ICD-10-CM | POA: Diagnosis not present

## 2021-05-08 DIAGNOSIS — M129 Arthropathy, unspecified: Secondary | ICD-10-CM | POA: Diagnosis not present

## 2021-05-08 DIAGNOSIS — E119 Type 2 diabetes mellitus without complications: Secondary | ICD-10-CM | POA: Diagnosis not present

## 2021-05-08 DIAGNOSIS — Z131 Encounter for screening for diabetes mellitus: Secondary | ICD-10-CM | POA: Diagnosis not present

## 2021-05-08 DIAGNOSIS — Z0000 Encounter for general adult medical examination without abnormal findings: Secondary | ICD-10-CM | POA: Diagnosis not present

## 2021-05-24 DIAGNOSIS — I10 Essential (primary) hypertension: Secondary | ICD-10-CM | POA: Diagnosis not present

## 2021-05-24 DIAGNOSIS — D539 Nutritional anemia, unspecified: Secondary | ICD-10-CM | POA: Diagnosis not present

## 2021-05-24 DIAGNOSIS — E7800 Pure hypercholesterolemia, unspecified: Secondary | ICD-10-CM | POA: Diagnosis not present

## 2021-05-24 DIAGNOSIS — E119 Type 2 diabetes mellitus without complications: Secondary | ICD-10-CM | POA: Diagnosis not present

## 2021-06-02 ENCOUNTER — Other Ambulatory Visit: Payer: Self-pay | Admitting: Internal Medicine

## 2021-06-02 DIAGNOSIS — I10 Essential (primary) hypertension: Secondary | ICD-10-CM

## 2021-06-02 DIAGNOSIS — M109 Gout, unspecified: Secondary | ICD-10-CM

## 2021-06-06 NOTE — Telephone Encounter (Signed)
He is no longer a patient at our clinic based on the chart. Looks like he may be going to [facility].

## 2021-07-26 DIAGNOSIS — Z0000 Encounter for general adult medical examination without abnormal findings: Secondary | ICD-10-CM | POA: Diagnosis not present

## 2021-07-26 DIAGNOSIS — Z125 Encounter for screening for malignant neoplasm of prostate: Secondary | ICD-10-CM | POA: Diagnosis not present

## 2021-11-16 ENCOUNTER — Encounter: Payer: Self-pay | Admitting: Emergency Medicine

## 2021-11-16 ENCOUNTER — Ambulatory Visit (INDEPENDENT_AMBULATORY_CARE_PROVIDER_SITE_OTHER): Payer: Medicare Other | Admitting: Emergency Medicine

## 2021-11-16 VITALS — BP 138/86 | HR 80 | Temp 98.3°F | Ht 67.0 in | Wt 169.0 lb

## 2021-11-16 DIAGNOSIS — E1165 Type 2 diabetes mellitus with hyperglycemia: Secondary | ICD-10-CM | POA: Insufficient documentation

## 2021-11-16 DIAGNOSIS — Z8679 Personal history of other diseases of the circulatory system: Secondary | ICD-10-CM | POA: Insufficient documentation

## 2021-11-16 DIAGNOSIS — I5020 Unspecified systolic (congestive) heart failure: Secondary | ICD-10-CM

## 2021-11-16 DIAGNOSIS — E113419 Type 2 diabetes mellitus with severe nonproliferative diabetic retinopathy with macular edema, unspecified eye: Secondary | ICD-10-CM | POA: Diagnosis not present

## 2021-11-16 DIAGNOSIS — I10 Essential (primary) hypertension: Secondary | ICD-10-CM

## 2021-11-16 DIAGNOSIS — I25119 Atherosclerotic heart disease of native coronary artery with unspecified angina pectoris: Secondary | ICD-10-CM

## 2021-11-16 DIAGNOSIS — Z8673 Personal history of transient ischemic attack (TIA), and cerebral infarction without residual deficits: Secondary | ICD-10-CM

## 2021-11-16 DIAGNOSIS — N184 Chronic kidney disease, stage 4 (severe): Secondary | ICD-10-CM | POA: Diagnosis not present

## 2021-11-16 DIAGNOSIS — Z7689 Persons encountering health services in other specified circumstances: Secondary | ICD-10-CM

## 2021-11-16 LAB — CBC WITH DIFFERENTIAL/PLATELET
Basophils Absolute: 0 10*3/uL (ref 0.0–0.1)
Basophils Relative: 0.7 % (ref 0.0–3.0)
Eosinophils Absolute: 0.8 10*3/uL — ABNORMAL HIGH (ref 0.0–0.7)
Eosinophils Relative: 16.8 % — ABNORMAL HIGH (ref 0.0–5.0)
HCT: 32.3 % — ABNORMAL LOW (ref 39.0–52.0)
Hemoglobin: 10.6 g/dL — ABNORMAL LOW (ref 13.0–17.0)
Lymphocytes Relative: 21.9 % (ref 12.0–46.0)
Lymphs Abs: 1 10*3/uL (ref 0.7–4.0)
MCHC: 32.8 g/dL (ref 30.0–36.0)
MCV: 81.8 fl (ref 78.0–100.0)
Monocytes Absolute: 0.4 10*3/uL (ref 0.1–1.0)
Monocytes Relative: 8.6 % (ref 3.0–12.0)
Neutro Abs: 2.4 10*3/uL (ref 1.4–7.7)
Neutrophils Relative %: 52 % (ref 43.0–77.0)
Platelets: 121 10*3/uL — ABNORMAL LOW (ref 150.0–400.0)
RBC: 3.95 Mil/uL — ABNORMAL LOW (ref 4.22–5.81)
RDW: 15 % (ref 11.5–15.5)
WBC: 4.6 10*3/uL (ref 4.0–10.5)

## 2021-11-16 LAB — COMPREHENSIVE METABOLIC PANEL
ALT: 10 U/L (ref 0–53)
AST: 15 U/L (ref 0–37)
Albumin: 3.5 g/dL (ref 3.5–5.2)
Alkaline Phosphatase: 108 U/L (ref 39–117)
BUN: 47 mg/dL — ABNORMAL HIGH (ref 6–23)
CO2: 27 mEq/L (ref 19–32)
Calcium: 8.4 mg/dL (ref 8.4–10.5)
Chloride: 108 mEq/L (ref 96–112)
Creatinine, Ser: 2.79 mg/dL — ABNORMAL HIGH (ref 0.40–1.50)
GFR: 21.73 mL/min — ABNORMAL LOW (ref >60.00)
Glucose, Bld: 117 mg/dL — ABNORMAL HIGH (ref 70–99)
Potassium: 3.8 mEq/L (ref 3.5–5.1)
Sodium: 141 mEq/L (ref 135–145)
Total Bilirubin: 0.4 mg/dL (ref 0.2–1.2)
Total Protein: 6.3 g/dL (ref 6.0–8.3)

## 2021-11-16 LAB — HEMOGLOBIN A1C: Hgb A1c MFr Bld: 6.4 % (ref 4.6–6.5)

## 2021-11-16 LAB — LIPID PANEL
Cholesterol: 162 mg/dL (ref 0–200)
HDL: 56.2 mg/dL (ref >39.00)
LDL Cholesterol: 90 mg/dL (ref 0–99)
NonHDL: 105.69
Total CHOL/HDL Ratio: 3
Triglycerides: 79 mg/dL (ref 0.0–149.0)
VLDL: 15.8 mg/dL (ref 0.0–40.0)

## 2021-11-16 MED ORDER — ROSUVASTATIN CALCIUM 10 MG PO TABS: 10.0000 mg | ORAL_TABLET | Freq: Every day | ORAL | 3 refills | Status: DC

## 2021-11-16 NOTE — Assessment & Plan Note (Signed)
Presently not on statins.  Recommend rosuvastatin 10 mg daily. ?

## 2021-11-16 NOTE — Assessment & Plan Note (Signed)
Presently not on A-fib.  Complex medical history.  Not anticoagulated. ?Needs cardiology evaluation. ?

## 2021-11-16 NOTE — Assessment & Plan Note (Signed)
Presently asymptomatic and euvolemic.  No signs of acute CHF. ?Continue Lasix 80 mg total daily dose. ?Needs cardiology follow-up ?

## 2021-11-16 NOTE — Assessment & Plan Note (Signed)
Unknown control.  No medications at present time.  Hemoglobin A1c done today. ?

## 2021-11-16 NOTE — Assessment & Plan Note (Signed)
Normotensive at present time. ?BP Readings from Last 3 Encounters:  ?11/16/21 138/86  ?03/14/21 (!) 177/98  ?01/12/21 (!) 165/69  ?Continue amlodipine 10 mg, carvedilol 6.25 mg twice a day, and Lasix 80 mg total daily dose ? ?

## 2021-11-16 NOTE — Assessment & Plan Note (Signed)
Blood work done today.  Unknown stability.  Advised to avoid NSAIDs. ?

## 2021-11-16 NOTE — Progress Notes (Signed)
Patrick [name] ?76 y.o. ? ? ?Chief Complaint  ?Patient presents with  ? New patient  ? ? ?HISTORY OF PRESENT ILLNESS: ?This is a 76 y.o. male first visit to this office, here to establish care with me.  Accompanied by wife and son. ?Has been under care of [facility]. ?Has the following chronic medical problems: ?1.  Essential hypertension: On amlodipine 10 mg and carvedilol 6.25 mg twice a day ?2.  Chronic systolic congestive heart failure: On Lasix total 80 mg daily and carvedilol 6.25 mg ?3.  History of stroke ?4.  History of atrial fibrillation.  No anticoagulation at present time due to complication in the past but unable to tell me what exactly happened.  There is a note that mentions an upper GI bleed episode in the past. ?5.  History of diabetes but no medication ?6.  Chronic kidney disease stage IV ?Needs cardiology and neurology referrals. ? ?HPI ? ? ?Prior to Admission medications   ?Medication Sig Start Date End Date Taking? Authorizing Provider  ?allopurinol (ZYLOPRIM) 100 MG tablet Take 0.5 tablets (50 mg total) by mouth every other day. 12/29/20  Yes Madalyn Rob, MD  ?amLODipine (NORVASC) 10 MG tablet Take 1 tablet by mouth once daily 03/09/21  Yes Gaylan Gerold, DO  ?carvedilol (COREG) 6.25 MG tablet Take 1 tablet (6.25 mg total) by mouth 2 (two) times daily with a meal. 12/20/20  Yes Madalyn Rob, MD  ?furosemide (LASIX) 40 MG tablet Take 2 tablets (80 mg total) by mouth daily. ?Patient not taking: Reported on 01/13/2021 10/13/20   Mitzi Hansen, MD  ? ? ?No Known Allergies ? ?Patient Active Problem List  ? Diagnosis Date Noted  ? Cerebrovascular accident (CVA) due to bilateral embolism of middle cerebral arteries ([place])   ? Systolic heart failure ([place]) 11/18/2015  ? Hematuria 08/30/2015  ? Chronic anemia 03/31/2014  ? Healthcare maintenance 03/31/2014  ? Permanent atrial fibrillation ([place]) 07/17/2013  ? Chronic anticoagulation, on coumadin 07/17/2013  ? Tachy-brady syndrome ([place])  07/17/2013  ? Acute ischemic VBA thalamic stroke ([place]) 12/11/2010  ? Chronic kidney disease (CKD), stage IV (severe) ([place]) 12/21/2008  ? Coronary atherosclerosis 07/19/2006  ? Diabetes mellitus ([place]) 06/05/2006  ? Hyperlipidemia 06/05/2006  ? Gout 06/05/2006  ? Essential hypertension 06/05/2006  ? Sleep apnea 06/05/2006  ? ? ?Past Medical History:  ?Diagnosis Date  ? Atrial fibrillation ([place])   ? CAD (coronary artery disease)   ? Chronic anticoagulation, on coumadin 07/17/2013  ? CKD (chronic kidney disease), stage II   ? CVA (cerebral infarction) 11/22/10  ? Thalamic with residual memory loss and slow speech  ? Depression   ? Diabetes mellitus type II   ? Insulin dependent  ? GERD (gastroesophageal reflux disease)   ? Gout   ? Hyperlipidemia   ? Hypertension   ? Myocardial infarction [facility]) 2000's  ? "near heart attack" (07/14/2013)  ? Paroxysmal atrial fibrillation (H[facility])   ? on coumadin  ? Permanent atrial fibrillation ([place]) 07/17/2013  ? Shortness of breath   ? "can happen at any time" (07/14/2013)  ? Sleep apnea 07/2010  ? "has mask at home; seldom uses it" (07/14/2013)  ? Stomach ulcer 1950's  ? "as a teenager"  ? Stroke [facility]) ~ 2007; ~ 2009  ? "memory not as good since" (07/14/2013)  ? Tachy-brady syndrome ([place]) 07/17/2013  ? Upper GI bleeding 07/01/2013  ? ? ?Past Surgical History:  ?Procedure Laterality Date  ? CARDIAC CATHETERIZATION  07/2003  ? Gareth Morgan  notes 08/27/2005 (07/01/2013)  ? ? ?Social History  ? ?Socioeconomic History  ? Marital status: Married  ?  Spouse name: Not on file  ? Number of children: Not on file  ? Years of education: 5  ? Highest education level: Not on file  ?Occupational History  ? Occupation: retired Tourist information centre manager  ?  Employer: UNEMPLOYED  ?Tobacco Use  ? Smoking status: Former  ?  Packs/day: 0.50  ?  Years: 15.00  ?  Pack years: 7.50  ?  Types: Cigarettes  ?  Quit date: 10/26/1976  ?  Years since quitting: 45.0  ? Smokeless tobacco: Former  ? Tobacco comments:  ?  07/14/2013 "quit chewing  and dipping in ~ 1970"  ?Substance and Sexual Activity  ? Alcohol use: No  ?  Alcohol/week: 0.0 standard drinks  ?  Comment: 07/14/2013 "drank a little; quit in ~ 1970; never had problem w/it"  ? Drug use: No  ? Sexual activity: Not Currently  ?Other Topics Concern  ? Not on file  ?Social History Narrative  ? Former smoker - stopped 1978  ? Alcohol stopped 1970  ? Full Code  ?   ? Current Social History 10/07/2019    ?   ? Patient lives with spouse Patrick [name]) and 1 son in a two level home.   ?   ? Patient's method of transportation is via family member (Son or daughter-in-law).  ?   ? The highest level of education was 5 th grade.  ?   ? The patient currently retired Engineer, manufacturing systems.  ?   ? Identified important Relationships are Wife, sons, and grandchildren   ?   ? Pets : None  ?    ? Interests / Fun: Watch TV   ?   ? Current Stressors: No bedroom downstairs. Has to climb 12 steps to bedroom.   ?   ? Religious / Personal Beliefs: Christian   ?   ? L. [place], BSN, RN-BC   ?   ? ?Social Determinants of Health  ? ?Financial Resource Strain: Not on file  ?Food Insecurity: Not on file  ?Transportation Needs: Not on file  ?Physical Activity: Not on file  ?Stress: Not on file  ?Social Connections: Not on file  ?Intimate Partner Violence: Not on file  ? ? ?Family History  ?Problem Relation Age of Onset  ? Diabetes Mother   ? Hypertension Mother   ? Heart Problems Father   ? Hypertension Sister   ? Diabetes Sister   ? Diabetes Sister   ? Diabetes Sister   ? Cancer Brother   ? ? ? ?Review of Systems  ?Constitutional: Negative.  Negative for chills and fever.  ?HENT: Negative.  Negative for congestion and sore throat.   ?Respiratory: Negative.  Negative for cough and shortness of breath.   ?Cardiovascular: Negative.  Negative for chest pain and palpitations.  ?Gastrointestinal:  Negative for abdominal pain, diarrhea, nausea and vomiting.  ?Genitourinary: Negative.   ?Skin: Negative.  Negative for rash.  ?Neurological: Negative.   Negative for dizziness and headaches.  ?All other systems reviewed and are negative. ? ?Today's Vitals  ? 11/16/21 1012  ?BP: 138/86  ?Pulse: 80  ?Temp: 98.3 ?F (36.8 ?C)  ?TempSrc: Oral  ?SpO2: 95%  ?Weight: 169 lb (76.7 kg)  ?Height: '5\' 7"'$  (1.702 m)  ? ?Body mass index is 26.47 kg/m?. ? ?Physical Exam ?Vitals reviewed.  ?Constitutional:   ?   Appearance: Normal appearance.  ?HENT:  ?  Head: Normocephalic.  ?Eyes:  ?   Extraocular Movements: Extraocular movements intact.  ?   Pupils: Pupils are equal, round, and reactive to light.  ?Cardiovascular:  ?   Rate and Rhythm: Normal rate and regular rhythm.  ?   Pulses: Normal pulses.  ?   Heart sounds: Normal heart sounds.  ?Pulmonary:  ?   Effort: Pulmonary effort is normal.  ?   Breath sounds: Normal breath sounds.  ?Abdominal:  ?   Palpations: Abdomen is soft.  ?   Tenderness: There is no abdominal tenderness.  ?Musculoskeletal:  ?   Cervical back: No tenderness.  ?   Right lower leg: No edema.  ?   Left lower leg: No edema.  ?Lymphadenopathy:  ?   Cervical: No cervical adenopathy.  ?Skin: ?   General: Skin is warm and dry.  ?   Capillary Refill: Capillary refill takes less than 2 seconds.  ?Neurological:  ?   General: No focal deficit present.  ?   Mental Status: He is alert and oriented to person, place, and time.  ?Psychiatric:     ?   Mood and Affect: Mood normal.     ?   Behavior: Behavior normal.  ? ? ? ?ASSESSMENT & PLAN: ?A total of 60 minutes was spent with the patient and counseling/coordination of care regarding preparing for this visit, establishing care with me and: Health medical system, review of available medical records, review of most recent blood work results, review of multiple chronic medical problems and their management, comprehensive history and physical examination, need for cardiology and neurology evaluations, prognosis, documentation and need for follow-up. ? ?Problem List Items Addressed This Visit   ? ?  ? Cardiovascular and Mediastinum   ? Essential hypertension (Chronic)  ?  Normotensive at present time. ?BP Readings from Last 3 Encounters:  ?11/16/21 138/86  ?03/14/21 (!) 177/98  ?01/12/21 (!) 165/69  ?Continue amlodipine 10 mg, carvedilol

## 2021-11-16 NOTE — Patient Instructions (Signed)
Health Maintenance After Age 74 After age 74, you are at a higher risk for certain long-term diseases and infections as well as injuries from falls. Falls are a major cause of broken bones and head injuries in people who are older than age 74. Getting regular preventive care can help to keep you healthy and well. Preventive care includes getting regular testing and making lifestyle changes as recommended by your health care provider. Talk with your health care provider about: Which screenings and tests you should have. A screening is a test that checks for a disease when you have no symptoms. A diet and exercise plan that is right for you. What should I know about screenings and tests to prevent falls? Screening and testing are the best ways to find a health problem early. Early diagnosis and treatment give you the best chance of managing medical conditions that are common after age 74. Certain conditions and lifestyle choices may make you more likely to have a fall. Your health care provider may recommend: Regular vision checks. Poor vision and conditions such as cataracts can make you more likely to have a fall. If you wear glasses, make sure to get your prescription updated if your vision changes. Medicine review. Work with your health care provider to regularly review all of the medicines you are taking, including over-the-counter medicines. Ask your health care provider about any side effects that may make you more likely to have a fall. Tell your health care provider if any medicines that you take make you feel dizzy or sleepy. Strength and balance checks. Your health care provider may recommend certain tests to check your strength and balance while standing, walking, or changing positions. Foot health exam. Foot pain and numbness, as well as not wearing proper footwear, can make you more likely to have a fall. Screenings, including: Osteoporosis screening. Osteoporosis is a condition that causes  the bones to get weaker and break more easily. Blood pressure screening. Blood pressure changes and medicines to control blood pressure can make you feel dizzy. Depression screening. You may be more likely to have a fall if you have a fear of falling, feel depressed, or feel unable to do activities that you used to do. Alcohol use screening. Using too much alcohol can affect your balance and may make you more likely to have a fall. Follow these instructions at home: Lifestyle Do not drink alcohol if: Your health care provider tells you not to drink. If you drink alcohol: Limit how much you have to: 0-1 drink a day for women. 0-2 drinks a day for men. Know how much alcohol is in your drink. In the [place] one drink equals one 12 oz bottle of beer (355 mL), one 5 oz glass of wine (148 mL), or one 1 oz glass of hard liquor (44 mL). Do not use any products that contain nicotine or tobacco. These products include cigarettes, chewing tobacco, and vaping devices, such as e-cigarettes. If you need help quitting, ask your health care provider. Activity  Follow a regular exercise program to stay fit. This will help you maintain your balance. Ask your health care provider what types of exercise are appropriate for you. If you need a cane or walker, use it as recommended by your health care provider. Wear supportive shoes that have nonskid soles. Safety  Remove any tripping hazards, such as rugs, cords, and clutter. Install safety equipment such as grab bars in bathrooms and safety rails on stairs. Keep rooms and walkways   well-lit. General instructions Talk with your health care provider about your risks for falling. Tell your health care provider if: You fall. Be sure to tell your health care provider about all falls, even ones that seem minor. You feel dizzy, tiredness (fatigue), or off-balance. Take over-the-counter and prescription medicines only as told by your health care provider. These include  supplements. Eat a healthy diet and maintain a healthy weight. A healthy diet includes low-fat dairy products, low-fat (lean) meats, and fiber from whole grains, beans, and lots of fruits and vegetables. Stay current with your vaccines. Schedule regular health, dental, and eye exams. Summary Having a healthy lifestyle and getting preventive care can help to protect your health and wellness after age 74. Screening and testing are the best way to find a health problem early and help you avoid having a fall. Early diagnosis and treatment give you the best chance for managing medical conditions that are more common for people who are older than age 74. Falls are a major cause of broken bones and head injuries in people who are older than age 74. Take precautions to prevent a fall at home. Work with your health care provider to learn what changes you can make to improve your health and wellness and to prevent falls. This information is not intended to replace advice given to you by your health care provider. Make sure you discuss any questions you have with your health care provider. Document Revised: 12/05/2020 Document Reviewed: 12/05/2020 Elsevier Patient Education  2023 Elsevier Inc.  

## 2021-11-16 NOTE — Assessment & Plan Note (Signed)
No present anticoagulation due to bleeding issues in the past. ?Has some cognitive residual symptoms. ?Needs neurology evaluation. ?

## 2021-11-17 ENCOUNTER — Other Ambulatory Visit: Payer: Self-pay | Admitting: Emergency Medicine

## 2021-11-17 DIAGNOSIS — N184 Chronic kidney disease, stage 4 (severe): Secondary | ICD-10-CM

## 2021-12-05 ENCOUNTER — Encounter: Payer: Self-pay | Admitting: *Deleted

## 2022-06-15 ENCOUNTER — Ambulatory Visit: Payer: Medicare Other | Admitting: Cardiology

## 2022-07-03 NOTE — Progress Notes (Unsigned)
Cardiology Office Note:    Date:  07/03/2022   ID:  Patrick [name], DOB 05-Oct-1945, MRN [id number]  PCP:  Horald Pollen, [place] Providers Cardiologist:  None     Referring MD: Horald Pollen, *    History of Present Illness:    Patrick [name] is a 76 y.o. male with a hx of CAD, Afib, DMII, HTN, HLD, OSA, stroke, CKD IV and chronic systolic HF who was referred by Dr. Mitchel Honour for further management of Afib, CAD and HF.  Patient has not followed with Cardiology since 2017. Per review of the record, TTE 10/2015 with LVEF 40-45%, severe LVH, no WMA, mild AR, moderate LAE.    Past Medical History:  Diagnosis Date   Atrial fibrillation ([place])    CAD (coronary artery disease)    Chronic anticoagulation, on coumadin 07/17/2013   CKD (chronic kidney disease), stage II    CVA (cerebral infarction) 11/22/10   Thalamic with residual memory loss and slow speech   Depression    Diabetes mellitus type II    Insulin dependent   GERD (gastroesophageal reflux disease)    Gout    Hyperlipidemia    Hypertension    Myocardial infarction ([place]) 2000's   "near heart attack" (07/14/2013)   Paroxysmal atrial fibrillation ([place])    on coumadin   Permanent atrial fibrillation ([place]) 07/17/2013   Shortness of breath    "can happen at any time" (07/14/2013)   Sleep apnea 07/2010   "has mask at home; seldom uses it" (07/14/2013)   Stomach ulcer 1950's   "as a teenager"   Stroke ([place]) ~ 2007; ~ 2009   "memory not as good since" (07/14/2013)   Tachy-brady syndrome ([place]) 07/17/2013   Upper GI bleeding 07/01/2013    Past Surgical History:  Procedure Laterality Date   CARDIAC CATHETERIZATION  07/2003   Gareth Morgan notes 08/27/2005 (07/01/2013)    Current Medications: No outpatient medications have been marked as taking for the 07/05/22 encounter (Appointment) with Freada Bergeron, MD.     Allergies:   Patient has no known allergies.   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  Tobacco Use   Smoking status: Former    Packs/day: 0.50    Years: 15.00    Total pack years: 7.50    Types: Cigarettes    Quit date: 10/26/1976    Years since quitting: 45.7   Smokeless tobacco: Former   Tobacco comments:    07/14/2013 "quit chewing and dipping in ~ 1970"  Substance and Sexual Activity   Alcohol use: No    Alcohol/week: 0.0 standard drinks of alcohol    Comment: 07/14/2013 "drank a little; quit in ~ 1970; never had problem w/it"   Drug use: No   Sexual activity: Not Currently  Other Topics Concern   Not on file  Social History Narrative   Former smoker - stopped 1978   Alcohol stopped 1970   Full Code      Current Social History 10/07/2019        Patient lives with spouse Patrick [name]) and 1 son in a two level home.       Patient's method of transportation is via family member (  Son or daughter-in-law).      The highest level of education was 5 th grade.      The patient currently retired Engineer, manufacturing systems.      Identified important Relationships are Wife, sons, and grandchildren       Pets : None       Interests / Fun: Watch TV       Current Stressors: No bedroom downstairs. Has to climb 12 steps to bedroom.       Religious / Personal Beliefs: Christian       L. Ducatte, BSN, RN-BC       Social Determinants of Health   Financial Resource Strain: Not on file  Food Insecurity: Not on file  Transportation Needs: Unmet Transportation Needs (10/12/2019)   PRAPARE - Hydrologist (Medical): Yes    Lack of Transportation (Non-Medical): Yes  Physical Activity: Inactive (10/12/2019)   Exercise Vital Sign    Days of Exercise per Week: 0 days    Minutes of Exercise per Session: 0  min  Stress: Not on file  Social Connections: Unknown (10/12/2019)   Social Connection and Isolation Panel [NHANES]    Frequency of Communication with Friends and Family: More than three times a week    Frequency of Social Gatherings with Friends and Family: Not on file    Attends Religious Services: More than 4 times per year    Active Member of [employer] or Organizations: Not on file    Attends Archivist Meetings: Not on file    Marital Status: Married     Family History: The patient's ***family history includes Cancer in his brother; Diabetes in his mother, sister, sister, and sister; Heart Problems in his father; Hypertension in his mother and sister.  ROS:   Please see the history of present illness.    *** All other systems reviewed and are negative.  EKGs/Labs/Other Studies Reviewed:    The following studies were reviewed today: TTE November 27, 2015: Study Conclusions   - Left ventricle: The cavity size was normal. Wall thickness was    increased in a pattern of moderate to severe LVH. Systolic    function was mildly to moderately reduced. The estimated ejection    fraction was in the range of 40% to 45%. Wall motion was normal;    there were no regional wall motion abnormalities.  - Aortic valve: There was mild regurgitation.  - Mitral valve: Calcified annulus.  - Left atrium: The atrium was moderately dilated.   EKG:  EKG is *** ordered today.  The ekg ordered today demonstrates ***  Recent Labs: 11/16/2021: ALT 10; BUN 47; Creatinine, Ser 2.79; Hemoglobin 10.6; Platelets 121.0; Potassium 3.8; Sodium 141  Recent Lipid Panel    Component Value Date/Time   CHOL 162 11/16/2021 1056   CHOL 204 (H) 12/20/2020 1639   TRIG 79.0 11/16/2021 1056   HDL 56.20 11/16/2021 1056   HDL 62 12/20/2020 1639   CHOLHDL 3 11/16/2021 1056   VLDL 15.8 11/16/2021 1056   LDLCALC 90 11/16/2021 1056   LDLCALC 121 (H) 12/20/2020 1639     Risk Assessment/Calculations:   {Does this  patient have ATRIAL FIBRILLATION?:[id number]}  No BP recorded.  {Refresh Note OR Click here to enter BP  :1}***         Physical Exam:    VS:  There were no vitals taken for this visit.    Wt Readings from Last 3 Encounters:  11/16/21 169 lb (76.7  kg)  03/14/21 159 lb (72.1 kg)  01/12/21 176 lb 3.2 oz (79.9 kg)     GEN: *** Well nourished, well developed in no acute distress HEENT: Normal NECK: No JVD; No carotid bruits LYMPHATICS: No lymphadenopathy CARDIAC: ***RRR, no murmurs, rubs, gallops RESPIRATORY:  Clear to auscultation without rales, wheezing or rhonchi  ABDOMEN: Soft, non-tender, non-distended MUSCULOSKELETAL:  No edema; No deformity  SKIN: Warm and dry NEUROLOGIC:  Alert and oriented x 3 PSYCHIATRIC:  Normal affect   ASSESSMENT:    No diagnosis found. PLAN:    In order of problems listed above:  #Chronic Systolic HF: TTE 40/9811 with LVEF 40-45%, severe LVH, mild AR. Has not seen Cardiology since 2017.  Currently euvolemic and compensated with NYHA class II symptoms. -Check TTE -Continue lasix '80mg'$  daily -Continue coreg 6.'25mg'$  BID -Cannot add ACE/ARB/spiro due to CKD IV -Low Na diet  #Permanent Afib: Not on AC due to ? Upper GIB.  -Refer to EP for consideration of Watchman -Continue coreg 6.'25mg'$  BID  #History of Stroke: -Start ***  #HTN: -Continue amlodipine '10mg'$  daily -Continue coreg 6.'25mg'$  BID  #CKD IV: -Referred to Nephrology      {Are you ordering a CV Procedure (e.g. stress test, cath, DCCV, TEE, etc)?   Press F2        :[id number]}    Medication Adjustments/Labs and Tests Ordered: Current medicines are reviewed at length with the patient today.  Concerns regarding medicines are outlined above.  No orders of the defined types were placed in this encounter.  No orders of the defined types were placed in this encounter.   There are no Patient Instructions on file for this visit.   Signed, Freada Bergeron, MD  07/03/2022 8:41 PM     [place]

## 2022-07-05 ENCOUNTER — Ambulatory Visit: Payer: Medicare Other | Attending: Cardiology | Admitting: Cardiology

## 2022-07-05 ENCOUNTER — Encounter: Payer: Self-pay | Admitting: Cardiology

## 2022-07-05 ENCOUNTER — Other Ambulatory Visit: Payer: Medicare Other

## 2022-07-05 VITALS — BP 180/80 | HR 61 | Ht 67.0 in | Wt 170.0 lb

## 2022-07-05 DIAGNOSIS — I4821 Permanent atrial fibrillation: Secondary | ICD-10-CM

## 2022-07-05 DIAGNOSIS — N184 Chronic kidney disease, stage 4 (severe): Secondary | ICD-10-CM

## 2022-07-05 DIAGNOSIS — I10 Essential (primary) hypertension: Secondary | ICD-10-CM

## 2022-07-05 DIAGNOSIS — I5020 Unspecified systolic (congestive) heart failure: Secondary | ICD-10-CM | POA: Diagnosis not present

## 2022-07-05 DIAGNOSIS — D6869 Other thrombophilia: Secondary | ICD-10-CM

## 2022-07-05 MED ORDER — HYDRALAZINE HCL 50 MG PO TABS: 50.0000 mg | ORAL_TABLET | Freq: Three times a day (TID) | ORAL | 3 refills | Status: DC

## 2022-07-05 NOTE — Addendum Note (Signed)
Addended by: Nuala Alpha on: 07/05/2022 04:29 PM   Modules accepted: Orders

## 2022-07-05 NOTE — Patient Instructions (Addendum)
Medication Instructions:   START TAKING HYDRALAZINE 50 MG BY MOUTH THREE TIMES DAILY  PLEASE TAKE YOUR ELIQUIS AS PRESCRIBED --THIS  IS TAKEN TWICE DAILY  PLEASE TAKE YOUR CARVEDILOL AS PRESCRIBED--THIS IS TAKEN TWICE DAILY  *If you need a refill on your cardiac medications before your next appointment, please call your pharmacy*   You have been referred to [place]  Lab: BMET SAME DAY AS YOU COME IN FOR YOUR ECHO   Testing/Procedures:  Your physician has requested that you have an echocardiogram. Echocardiography is a painless test that uses sound waves to create images of your heart. It provides your doctor with information about the size and shape of your heart and how well your heart's chambers and valves are working. This procedure takes approximately one hour. There are no restrictions for this procedure. Please do NOT wear cologne, perfume, aftershave, or lotions (deodorant is allowed). Please arrive 15 minutes prior to your appointment time.    Follow-Up:  3 MONTHS WITH AN EXTENDER PER DR. Dava Najjar WITH ERNEST DICK NP OR Christen Bame NP

## 2022-07-05 NOTE — Progress Notes (Signed)
Cardiology Office Note:    Date:  07/05/2022   ID:  Patrick [name], DOB [birth year], MRN [id number]  PCP:  Horald Pollen, [place] Providers Cardiologist:  None     Referring MD: Horald Pollen, *    History of Present Illness:    Patrick [name] is a 76 y.o. male with a hx of CAD, Afib, DMII, HTN, HLD, OSA, stroke, CKD IV and chronic systolic HF who was referred by Dr. Mitchel Honour for further management of Afib, CAD and HF.  Patient has not followed with Cardiology since 2017. Per review of the record, TTE 10/2015 with LVEF 40-45%, severe LVH, no WMA, mild AR, moderate LAE.  The patient is accompanied by two family members who provide the majority of the history. They are unsure about his cardiac history in general but confirm he has known Afib. States he has not followed regularly with cardiology in several years.   Overall, he has been feeling okay. Has chronic LE edema and has decided to stop his lasix due to concern about his kidneys. No chest pain, SOB or palpitations. His blood pressure was elevated today in the clinic at 180/80. At home they see readings in the 706'C systolic. He has not yet taken his medication as he usually does so in the evenings. Due to some confusion he has not been taking any morning doses of Eliquis or carvedilol.   In July, his hydralazine was stopped, but he is not sure why. Previously there was concern regarding urinary frequency on hydralazine. His family confirms that he produces adequate urine.   He denies any palpitations, chest pain, shortness of breath. No lightheadedness, headaches, syncope, orthopnea, or PND.    Past Medical History:  Diagnosis Date   Atrial fibrillation ([place])    CAD (coronary artery disease)    Chronic anticoagulation, on coumadin 07/17/2013   CKD (chronic kidney disease), stage II    CVA (cerebral infarction) 11/22/10   Thalamic with residual memory loss and slow speech   Depression    Diabetes  mellitus type II    Insulin dependent   GERD (gastroesophageal reflux disease)    Gout    Hyperlipidemia    Hypertension    Myocardial infarction ([place]) 2000's   "near heart attack" (07/14/2013)   Paroxysmal atrial fibrillation ([place])    on coumadin   Permanent atrial fibrillation ([place]) 07/17/2013   Shortness of breath    "can happen at any time" (07/14/2013)   Sleep apnea 07/2010   "has mask at home; seldom uses it" (07/14/2013)   Stomach ulcer 1950's   "as a teenager"   Stroke ([place]) ~ 2007; ~ 2009   "memory not as good since" (07/14/2013)   Tachy-brady syndrome ([place]) 07/17/2013   Upper GI bleeding 07/01/2013    Past Surgical History:  Procedure Laterality Date   CARDIAC CATHETERIZATION  07/2003   Gareth Morgan notes 08/27/2005 (07/01/2013)    Current Medications: Current Meds  Medication Sig   allopurinol (ZYLOPRIM) 100 MG tablet Take 0.5 tablets (50 mg total) by mouth every other day.   amLODipine (NORVASC) 10 MG tablet Take 1 tablet by mouth once daily   carvedilol (COREG) 6.25 MG tablet Take 1 tablet (6.25 mg total) by mouth 2 (two) times daily with a meal.   ELIQUIS 5 MG TABS tablet Take 5 mg by mouth 2 (two) times daily.   hydrALAZINE (APRESOLINE) 50 MG tablet Take 1 tablet (50 mg total) by mouth 3 (three) times daily.  Allergies:   Patient has no known allergies.   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  Tobacco Use   Smoking status: Former    Packs/day: 0.50    Years: 15.00    Total pack years: 7.50    Types: Cigarettes    Quit date: 10/26/1976    Years since quitting: 45.7   Smokeless tobacco: Former   Tobacco comments:    07/14/2013 "quit chewing and dipping in ~ 1970"  Substance and Sexual Activity   Alcohol use: No     Alcohol/week: 0.0 standard drinks of alcohol    Comment: 07/14/2013 "drank a little; quit in ~ 1970; never had problem w/it"   Drug use: No   Sexual activity: Not Currently  Other Topics Concern   Not on file  Social History Narrative   Former smoker - stopped 1978   Alcohol stopped 1970   Full Code      Current Social History 10/07/2019        Patient lives with spouse [name]) and 1 son in a two level home.       Patient's method of transportation is via family member (Son or daughter-in-law).      The highest level of education was 5 th grade.      The patient currently retired Engineer, manufacturing systems.      Identified important Relationships are Wife, sons, and grandchildren       Pets : None       Interests / Fun: Watch TV       Current Stressors: No bedroom downstairs. Has to climb 12 steps to bedroom.       Religious / Personal Beliefs: Christian       L. Ducatte, BSN, RN-BC       Social Determinants of Health   Financial Resource Strain: Not on file  Food Insecurity: Not on file  Transportation Needs: Unmet Transportation Needs (10/12/2019)   PRAPARE - Hydrologist (Medical): Yes    Lack of Transportation (Non-Medical): Yes  Physical Activity: Inactive (10/12/2019)   Exercise Vital Sign    Days of Exercise per Week: 0 days    Minutes of Exercise per Session: 0 min  Stress: Not on file  Social Connections: Unknown (10/12/2019)   Social Connection and Isolation Panel [NHANES]    Frequency of Communication with Friends and Family: More than three times a week    Frequency of Social Gatherings with Friends and Family: Not on file    Attends Religious Services: More than 4 times per year    Active Member of [employer] or Organizations: Not on file    Attends Archivist Meetings: Not on file    Marital Status: Married     Family History: The patient's family history includes Cancer in his brother; Diabetes in his mother, sister, sister,  and sister; Heart Problems in his father; Hypertension in his mother and sister.  ROS:   Review of Systems  Constitutional:  Negative for chills and fever.  HENT:  Negative for nosebleeds and tinnitus.   Eyes:  Negative for blurred vision, pain and redness.  Respiratory:  Negative for cough, hemoptysis, shortness of breath and stridor.  Cardiovascular:  Positive for leg swelling (feet/ankles). Negative for chest pain, palpitations, orthopnea, claudication and PND.  Gastrointestinal:  Negative for blood in stool, diarrhea, nausea and vomiting.  Genitourinary:  Negative for dysuria and hematuria.  Musculoskeletal:  Negative for falls.  Neurological:  Negative for dizziness, loss of consciousness and headaches.  Psychiatric/Behavioral:  Negative for depression, hallucinations and substance abuse. The patient does not have insomnia.      EKGs/Labs/Other Studies Reviewed:    The following studies were reviewed today: Echo 01/31/2022 [facility]): Summary    Procedure Details: Agitated Saline was utilized..   Left Ventricle: The left ventricle cavity size is normal. The estimated ejection fraction is 50-55%. There is severe concentric LV hypertrophy visualized. The diastolic function is indeterminate.. Left ventricular systolic function is low-normal.   Left Atrium: The left atrium cavity size is severely dilated..   Right Atrium: The right atrium cavity size is mildly dilated.   Right Ventricle: The right ventricle cavity size is normal. Normal systolic function is visualized.   Aortic Valve: Mild to moderate aortic regurgitation is visualized. The aortic valve leaflets are mildly calcified with reduced systolic excursion, however by the gradients obtained there is no stenosis.   Mitral Valve: Mild mitral regurgitation is visualized. Severe annular calcification is visualized.   Tricuspid Valve: Mild tricuspid regurgitation is visualized.   Pericardium: There is a moderate-sized  pericardial effusion, predominantly located along the lateral wall of the left ventricle. There are no echocardiographic signs of tamponade.   IVC / Hepatic Veins: The inferior vena cava is normal.   Septum: There is no shunt by agitated saline or color Doppler.   Carotid Doppler 01/29/2022 ([facility]): Impression  Somewhat limited study sensitivity and specificity due to vascular tortuosity.  RIGHT ICA: No convincing evidence to suggest hemodynamically significant (50% or greater) ICA stenosis.  LEFT ICA: No convincing evidence to suggest hemodynamically significant (50% or greater) ICA stenosis.  Bilateral vertebral arteries appear patent, with antegrade flow. Carotid Doppler 11/20/2015: Summary:  Findings consistent with 1-39 percent stenosis involving the right  internal carotid artery and the left internal carotid artery.  Abnormal right vertebral artery waveform suggests distal stenosis.   TTE 10/2015: Study Conclusions   - Left ventricle: The cavity size was normal. Wall thickness was    increased in a pattern of moderate to severe LVH. Systolic    function was mildly to moderately reduced. The estimated ejection    fraction was in the range of 40% to 45%. Wall motion was normal;    there were no regional wall motion abnormalities.  - Aortic valve: There was mild regurgitation.  - Mitral valve: Calcified annulus.  - Left atrium: The atrium was moderately dilated.   EKG:  EKG is personally reviewed. 07/05/2022: AFib. Rate 61 bpm.   Recent Labs: 11/16/2021: ALT 10; BUN 47; Creatinine, Ser 2.79; Hemoglobin 10.6; Platelets 121.0; Potassium 3.8; Sodium 141   Recent Lipid Panel    Component Value Date/Time   CHOL 162 11/16/2021 1056   CHOL 204 (H) 12/20/2020 1639   TRIG 79.0 11/16/2021 1056   HDL 56.20 11/16/2021 1056   HDL 62 12/20/2020 1639   CHOLHDL 3 11/16/2021 1056   VLDL 15.8 11/16/2021 1056   LDLCALC 90 11/16/2021 1056   LDLCALC 121 (H) 12/20/2020 1639      Risk Assessment/Calculations:    CHA2DS2-VASc Score = 6  } This indicates a 9.7% annual risk of stroke. The patient's score is based upon: CHF History: 1 HTN History: 1 Diabetes  History: 0 Stroke History: 2 Vascular Disease History: 1 Age Score: 1 Gender Score: 0         Physical Exam:    VS:  BP (!) 180/80   Pulse 61   Ht '5\' 7"'$  (1.702 m)   Wt 170 lb (77.1 kg)   SpO2 99%   BMI 26.63 kg/m     Wt Readings from Last 3 Encounters:  07/05/22 170 lb (77.1 kg)  11/16/21 169 lb (76.7 kg)  03/14/21 159 lb (72.1 kg)     GEN: Well nourished, well developed in no acute distress HEENT: Normal NECK: No JVD; No carotid bruits CARDIAC: Irregularly irregular, 2/6 murmur, no rubs, no gallops RESPIRATORY:  Clear to auscultation without rales, wheezing or rhonchi  ABDOMEN: Soft, non-tender, non-distended MUSCULOSKELETAL:  1+ bilateral LE edema, warm SKIN: Warm and dry NEUROLOGIC:  Alert and oriented x 3 PSYCHIATRIC:  Normal affect   ASSESSMENT:    1. Systolic heart failure, unspecified HF chronicity ([place])   2. Essential hypertension   3. Permanent atrial fibrillation ([place])   4. Chronic kidney disease (CKD), stage IV (severe) ([place])   5. Secondary hypercoagulable state ([place])    PLAN:    In order of problems listed above:  #Chronic Systolic HF: TTE 16/1096 with LVEF 40-45%, severe LVH, mild AR. Has not seen Cardiology since 2017.  Currently with 1+ bilateral LE edema on exam but denies orthopnea or PND. Has declined taking lasix due to concern that this is harmful to his kidneys and he already has frequent urination. Currently denies any anginal symptoms. Will check TTE to reassess EF and start hydralazine for BP lowering. -Check TTE -Continue coreg 6.'25mg'$  BID -Restart hydralazine '50mg'$  TID -Continue amlodipine '10mg'$  daily -Cannot add ACE/ARB/spiro due to CKD IV -Low Na diet  #Permanent Afib: CHADs-vasc 6. On apixaban for [facility]. Discussed it is important to take it twice  per day for stroke prevention. -Discussed importance of taking apixaban '5mg'$  BID -Continue coreg 6.'25mg'$  BID  #History of Stroke: -Continue apixaban '5mg'$  BID -Off statin due to ? Side effects  #HTN: Very elevated today. Suspect this is contributing to his reduced EF and likely primary driver of CKD IV. -Continue amlodipine '10mg'$  daily -Continue coreg 6.'25mg'$  BID -Start hydralazine '50mg'$  TID  #CKD IV: -Refer to Nephrology -BP control as above         Follow-up: 3 months.  Medication Adjustments/Labs and Tests Ordered: Current medicines are reviewed at length with the patient today.  Concerns regarding medicines are outlined above.   Orders Placed This Encounter  Procedures   Ambulatory referral to Nephrology   EKG 12-Lead   ECHOCARDIOGRAM COMPLETE   Meds ordered this encounter  Medications   hydrALAZINE (APRESOLINE) 50 MG tablet    Sig: Take 1 tablet (50 mg total) by mouth 3 (three) times daily.    Dispense:  270 tablet    Refill:  3   Patient Instructions  Medication Instructions:   START TAKING HYDRALAZINE 50 MG BY MOUTH THREE TIMES DAILY  PLEASE TAKE YOUR ELIQUIS AS PRESCRIBED --THIS  IS TAKEN TWICE DAILY  PLEASE TAKE YOUR CARVEDILOL AS PRESCRIBED--THIS IS TAKEN TWICE DAILY  *If you need a refill on your cardiac medications before your next appointment, please call your pharmacy*   You have been referred to [place]    Testing/Procedures:  Your physician has requested that you have an echocardiogram. Echocardiography is a painless test that uses sound waves to create images of your  heart. It provides your doctor with information about the size and shape of your heart and how well your heart's chambers and valves are working. This procedure takes approximately one hour. There are no restrictions for this procedure. Please do NOT wear cologne, perfume, aftershave, or lotions (deodorant is allowed). Please arrive 15 minutes  prior to your appointment time.    Follow-Up:  3 MONTHS WITH AN EXTENDER PER DR. Dava Najjar WITH ERNEST DICK NP OR Christen Bame NP         Felicie Morn L Brogden,acting as a scribe for Freada Bergeron, MD.,have documented all relevant documentation on the behalf of Freada Bergeron, MD,as directed by  Freada Bergeron, MD while in the presence of Freada Bergeron, MD.   I, Freada Bergeron, MD, have reviewed all documentation for this visit. The documentation on 07/05/22 for the exam, diagnosis, procedures, and orders are all accurate and complete.   Signed, Freada Bergeron, MD  07/05/2022 4:26 PM    [place]

## 2022-08-03 ENCOUNTER — Ambulatory Visit: Payer: Medicare Other

## 2022-08-03 ENCOUNTER — Ambulatory Visit: Payer: Medicare Other | Attending: Cardiology

## 2022-08-03 DIAGNOSIS — I5020 Unspecified systolic (congestive) heart failure: Secondary | ICD-10-CM

## 2022-08-03 DIAGNOSIS — I4821 Permanent atrial fibrillation: Secondary | ICD-10-CM

## 2022-08-03 DIAGNOSIS — N184 Chronic kidney disease, stage 4 (severe): Secondary | ICD-10-CM | POA: Insufficient documentation

## 2022-08-03 DIAGNOSIS — D6869 Other thrombophilia: Secondary | ICD-10-CM | POA: Insufficient documentation

## 2022-08-03 DIAGNOSIS — I10 Essential (primary) hypertension: Secondary | ICD-10-CM

## 2022-08-03 LAB — ECHOCARDIOGRAM COMPLETE
Calc EF: 41.5 %
S' Lateral: 2.9 cm
Single Plane A2C EF: 45.4 %
Single Plane A4C EF: 39.2 %

## 2022-08-04 LAB — BASIC METABOLIC PANEL
BUN/Creatinine Ratio: 13 (ref 10–24)
BUN: 44 mg/dL — ABNORMAL HIGH (ref 8–27)
CO2: 20 mmol/L (ref 20–29)
Calcium: 8.3 mg/dL — ABNORMAL LOW (ref 8.6–10.2)
Chloride: 107 mmol/L — ABNORMAL HIGH (ref 96–106)
Creatinine, Ser: 3.52 mg/dL — ABNORMAL HIGH (ref 0.76–1.27)
Glucose: 93 mg/dL (ref 70–99)
Potassium: 3.6 mmol/L (ref 3.5–5.2)
Sodium: 145 mmol/L — ABNORMAL HIGH (ref 134–144)
eGFR: 17 mL/min/{1.73_m2} — ABNORMAL LOW (ref >59)

## 2022-08-06 ENCOUNTER — Telehealth: Payer: Self-pay | Admitting: *Deleted

## 2022-08-06 DIAGNOSIS — I5020 Unspecified systolic (congestive) heart failure: Secondary | ICD-10-CM

## 2022-08-06 DIAGNOSIS — I10 Essential (primary) hypertension: Secondary | ICD-10-CM

## 2022-08-06 DIAGNOSIS — I77819 Aortic ectasia, unspecified site: Secondary | ICD-10-CM

## 2022-08-06 NOTE — Telephone Encounter (Signed)
The patient and wife has been notified of the result and verbalized understanding.  All questions (if any) were answered.  Both aware that the pt will need a repeat echo in one year for surveillance.   Both aware that I will go ahead and place the order for repeat echo in one year in the system and send a message to our [facility] Schedulers to call them back to arrange that appt for that time.   Both verbalized understanding and agrees with this plan.

## 2022-08-06 NOTE — Telephone Encounter (Signed)
-----   Message from Freada Bergeron, MD sent at 08/03/2022  7:58 PM EST ----- His echo shows low normal pumping function (which was improved from her prior). There is mildly elevated pressures in her lungs, mild leakiness of her mitral valve and aortic valve. There is also mild dilation of her aorta. We will continue to monitor this with yearly echoes going forward. We need to work on blood pressure control going forward.

## 2022-10-03 NOTE — Progress Notes (Deleted)
Office Visit    Patient Name: Patrick [name] Date of Encounter: 10/03/2022  Primary Care Provider:  Horald Pollen, MD Primary Cardiologist:  None Primary Electrophysiologist: None  Chief Complaint    Patrick [name] is a 77 y.o. male with PMH of permanent AF (on Coumadin), CAD s/p MI, IDDM  type II, CKD stage IV, HFrEF, GERD HTN, HLD, OSA, thalamic CVA 2012 who presents today for 73-monthfollow-up.  Past Medical History    Past Medical History:  Diagnosis Date   Atrial fibrillation ([facility]    CAD (coronary artery disease)    Chronic anticoagulation, on coumadin 07/17/2013   CKD (chronic kidney disease), stage II    CVA (cerebral infarction) 11/22/10   Thalamic with residual memory loss and slow speech   Depression    Diabetes mellitus type II    Insulin dependent   GERD (gastroesophageal reflux disease)    Gout    Hyperlipidemia    Hypertension    [place]ocardial [place] ([facility] 2000[place]   "near heart attack" (07/14/2013)   Paroxysmal atrial fibrillation ([facility]    on coumadin   Permanent atrial fibrillation ([facility] 07/17/2013   Shortness of breath    "can happen at any time" (07/14/2013)   Sleep apnea 07/2010   "has mask at home; seldom uses it" (07/14/2013)   Stomach ulcer 1950's   "as a teenager"   Stroke ([facility] ~ 2007; ~ 2009   "memory not as good since" (07/14/2013)   Tachy-brady syndrome ([facility] 07/17/2013   Upper GI bleeding 07/01/2013   Past Surgical History:  Procedure Laterality Date   CARDIAC CATHETERIZATION  07/2003   /Gareth Morgannotes 08/27/2005 (07/01/2013)    Allergies  No Known Allergies  History of Present Illness    BShawnte [name] is a 77year old male with the above mention past medical history who presents today for 392-monthollow-up of CHF, AF, and CAD.  She previously had not been followed by cardiology since 2017 and reestablish care with Dr. PeJohney Framen 07/05/2022.  During follow-up patient reported feeling okay overall but does have chronic lower  extremity edema and blood pressure was elevated during visit.  He was noncompliant due to confusion with his Eliquis and carvedilol.  He was also not on antihypertensives due to frequent urination.  Previous 2D echo completed in 2017 showed EF of 40-45% and severe LVH with mild AR.  Patient declined Lasix due to possible harm to his kidneys and frequent urination.  TTE was ordered and patient was started back on hydralazine.  ACE/ARB/spironolactone was avoided due to CKD stage IV.  He was referred to nephrology due to CKD stage IV.  2D echo revealed improved EF of 50-55% with no RWMA and severe concentric LVH with normal LV systolic function and massively dilated LA with mild MR, moderate to severe annular calcification with dilated aortic root of 40 mm.  Since last being seen in the office patient reports***.  Patient denies chest pain, palpitations, dyspnea, PND, orthopnea, nausea, vomiting, dizziness, syncope, edema, weight gain, or early satiety.     ***Notes:  Home Medications    Current Outpatient Medications  Medication Sig Dispense Refill   allopurinol (ZYLOPRIM) 100 MG tablet Take 0.5 tablets (50 mg total) by mouth every other day. 30 tablet 1   amLODipine (NORVASC) 10 MG tablet Take 1 tablet by mouth once daily 90 tablet 0   carvedilol (COREG) 6.25 MG tablet Take 1 tablet (6.25 mg total) by mouth 2 (two) times daily with  a meal. 90 tablet 1   ELIQUIS 5 MG TABS tablet Take 5 mg by mouth 2 (two) times daily.     hydrALAZINE (APRESOLINE) 50 MG tablet Take 1 tablet (50 mg total) by mouth 3 (three) times daily. 270 tablet 3   No current facility-administered medications for this visit.     Review of Systems  Please see the history of present illness.    (+)*** (+)***  All other systems reviewed and are otherwise negative except as noted above.  Physical Exam    Wt Readings from Last 3 Encounters:  07/05/22 170 lb (77.1 kg)  11/16/21 169 lb (76.7 kg)  03/14/21 159 lb (72.1 kg)    [id number] were no vitals filed for this visit.,There is no height or weight on file to calculate BMI.  Constitutional:      Appearance: Healthy appearance. Not in distress.  Neck:     Vascular: JVD normal.  Pulmonary:     Effort: Pulmonary effort is normal.     Breath sounds: No wheezing. No rales. Diminished in the bases Cardiovascular:     Normal rate. Regular rhythm. Normal S1. Normal S2.      Murmurs: There is no murmur.  Edema:    Peripheral edema absent.  Abdominal:     Palpations: Abdomen is soft non tender. There is no hepatomegaly.  Skin:    General: Skin is warm and dry.  Neurological:     General: No focal deficit present.     Mental Status: Alert and oriented to person, place and time.     Cranial Nerves: Cranial nerves are intact.  EKG/LABS/ Recent Cardiac Studies    ECG personally reviewed by me today - ***  Risk Assessment/Calculations:   {Does this patient have ATRIAL FIBRILLATION?:[phone]}        Lab Results  Component Value Date   WBC 4.6 11/16/2021   HGB 10.6 (L) 11/16/2021   HCT 32.3 (L) 11/16/2021   MCV 81.8 11/16/2021   PLT 121.0 (L) 11/16/2021   Lab Results  Component Value Date   CREATININE 3.52 (H) 08/03/2022   BUN 44 (H) 08/03/2022   NA 145 (H) 08/03/2022   K 3.6 08/03/2022   CL 107 (H) 08/03/2022   CO2 20 08/03/2022   Lab Results  Component Value Date   ALT 10 11/16/2021   AST 15 11/16/2021   ALKPHOS 108 11/16/2021   BILITOT 0.4 11/16/2021   Lab Results  Component Value Date   CHOL 162 11/16/2021   HDL 56.20 11/16/2021   LDLCALC 90 11/16/2021   TRIG 79.0 11/16/2021   CHOLHDL 3 11/16/2021    Lab Results  Component Value Date   HGBA1C 6.4 11/16/2021    Cardiac Studies & Procedures       ECHOCARDIOGRAM  ECHOCARDIOGRAM COMPLETE 08/03/2022  Narrative ECHOCARDIOGRAM REPORT    Patient Name:   Patrick [name]  Date of Exam: 08/03/2022 Medical Rec #:  [id number]     Height:       67.0 in Accession #:    [id number]     Weight:       170.0 lb Date of Birth:  October 08, 1945      BSA:          1.887 m Patient Age:    43 years      BP:           191/101 mmHg Patient Gender: M             HR:  61 bpm. Exam Location:  [facility]  Procedure: Cardiac Doppler, 2D Echo, Color Doppler and Strain Analysis  Indications:    Heart Failure I50.2  History:        Patient has prior history of Echocardiogram examinations, most recent 11/20/2015. CHF, CAD, Arrythmias:Atrial Fibrillation; Risk Factors:Dyslipidemia, Diabetes and Hypertension.  Sonographer:    Mikki Santee RDCS Referring Phys: [id number] [place]   1. Left ventricular ejection fraction, by estimation, is 50 to 55%. The left ventricle has low normal function. The left ventricle has no regional wall motion abnormalities. There is severe concentric left ventricular hypertrophy. Left ventricular diastolic function could not be evaluated. 2. Right ventricular systolic function is normal. The right ventricular size is normal. There is mildly elevated pulmonary artery systolic pressure. The estimated right ventricular systolic pressure is 123XX123 mmHg. 3. Left atrial size was Massively dilated. 4. The mitral valve is degenerative. Mild mitral valve regurgitation. No evidence of mitral stenosis. Moderate to severe mitral annular calcification. 5. The aortic valve has an indeterminant number of cusps. Aortic valve regurgitation is mild. Aortic valve sclerosis/calcification is present, without any evidence of aortic stenosis. 6. Aortic dilatation noted. There is mild dilatation of the aortic root, measuring 40 mm. There is mild dilatation of the ascending aorta, measuring 39 mm. 7. The inferior vena cava is normal in size with greater than 50% respiratory variability, suggesting right atrial pressure of 3 mmHg. 8. A small pericardial effusion is present. The pericardial effusion is surrounding the apex and circumferential. There is no  evidence of cardiac tamponade.  FINDINGS Left Ventricle: Left ventricular ejection fraction, by estimation, is 50 to 55%. The left ventricle has low normal function. The left ventricle has no regional wall motion abnormalities. Global longitudinal strain performed but not reported based on interpreter judgement due to suboptimal tracking. The left ventricular internal cavity size was normal in size. There is severe concentric left ventricular hypertrophy. Left ventricular diastolic function could not be evaluated due to atrial fibrillation. Left ventricular diastolic function could not be evaluated.  Right Ventricle: The right ventricular size is normal. No increase in right ventricular wall thickness. Right ventricular systolic function is normal. There is mildly elevated pulmonary artery systolic pressure. The tricuspid regurgitant velocity is 3.20 m/s, and with an assumed right atrial pressure of 3 mmHg, the estimated right ventricular systolic pressure is 123XX123 mmHg.  Left Atrium: Left atrial size was Massively dilated.  Right Atrium: Right atrial size was normal in size.  Pericardium: A small pericardial effusion is present. The pericardial effusion is surrounding the apex and circumferential. There is no evidence of cardiac tamponade.  Mitral Valve: The mitral valve is degenerative in appearance. There is mild thickening of the mitral valve leaflet(s). Moderate to severe mitral annular calcification. Mild mitral valve regurgitation. No evidence of mitral valve stenosis.  Tricuspid Valve: The tricuspid valve is normal in structure. Tricuspid valve regurgitation is mild . No evidence of tricuspid stenosis.  Aortic Valve: The aortic valve has an indeterminant number of cusps. Aortic valve regurgitation is mild. Aortic valve sclerosis/calcification is present, without any evidence of aortic stenosis.  Pulmonic Valve: The pulmonic valve was normal in structure. Pulmonic valve regurgitation is  trivial. No evidence of pulmonic stenosis.  Aorta: Aortic dilatation noted. There is mild dilatation of the aortic root, measuring 40 mm. There is mild dilatation of the ascending aorta, measuring 39 mm.  Venous: The inferior vena cava is normal in size with greater than 50% respiratory variability, suggesting  right atrial pressure of 3 mmHg.  IAS/Shunts: No atrial level shunt detected by color flow Doppler.   LEFT VENTRICLE PLAX 2D LVIDd:         4.50 cm LVIDs:         2.90 cm LV PW:         1.50 cm LV IVS:        1.60 cm LVOT diam:     2.50 cm LVOT Area:     4.91 cm  LV Volumes (MOD) LV vol d, MOD A2C: 85.2 ml LV vol d, MOD A4C: 111.0 ml LV vol s, MOD A2C: 46.5 ml LV vol s, MOD A4C: 67.5 ml LV SV MOD A2C:     38.7 ml LV SV MOD A4C:     111.0 ml LV SV MOD BP:      40.8 ml  RIGHT VENTRICLE RV Basal diam:  3.40 cm RV Mid diam:    2.60 cm RV S prime:     11.00 cm/s TAPSE (M-mode): 1.4 cm  LEFT ATRIUM              Index        RIGHT ATRIUM           Index LA diam:        4.30 cm  2.28 cm/m   RA Area:     20.50 cm LA Vol (A2C):   137.0 ml 72.59 ml/m  RA Volume:   50.20 ml  26.60 ml/m LA Vol (A4C):   148.0 ml 78.42 ml/m LA Biplane Vol: 146.0 ml 77.36 ml/m  AORTA Ao Root diam: 4.00 cm Ao Asc diam:  3.90 cm  TRICUSPID VALVE TR Peak grad:   41.0 mmHg TR Vmax:        320.00 cm/s  SHUNTS Systemic Diam: 2.50 cm  Fransico Him MD Electronically signed by Fransico Him MD Signature Date/Time: 08/03/2022/3:56:57 PM    Final             Assessment & Plan    1.HFimEF: -Patient's previous 2D echo in 2017 showed EF of 40-45% with most recent TTE completed 08/02/2022 with improved EF of 50-55% with concentric LVH and massively dilated LA. -Patient has history of chronic lower extremity edema and refused Lasix due to injury -GDMT limited by CKD and patient currently on*** -He is***  2.  Permanent atrial fibrillation: -Today patient is*** -He is currently on Eliquis  5 mg twice daily -  3.  Essential hypertension: -Patient had previous elevated blood pressures and today blood pressure is*** -Continue***  4.  History of CVA: -Patient suffered thalamic CVA and is currently not on statin due to questionable side effects -continue Eliquis 5 mg twice daily  5.  CKD stage IV: -Patient was previously referred to nephrology***  6.  Nonobstructive CAD: -Patient's last ischemic evaluation completed stress test in 2012 by adenosine Cardiolite that showed no evidence of ischemia -Today patient reports***      Disposition: Follow-up with None or APP in *** months {Are you ordering a CV Procedure (e.g. stress test, cath, DCCV, TEE, etc)?   Press F2        :[id number]   Medication Adjustments/Labs and Tests Ordered: Current medicines are reviewed at length with the patient today.  Concerns regarding medicines are outlined above.   Signed, Mable Fill, Marissa Nestle, NP 10/03/2022, 10:24 AM [facility]  Note:  This document was prepared using Dragon voice recognition software and may include unintentional  dictation errors.

## 2022-10-04 ENCOUNTER — Ambulatory Visit: Payer: Medicare Other | Admitting: Nurse Practitioner

## 2022-11-02 ENCOUNTER — Telehealth: Payer: Self-pay | Admitting: *Deleted

## 2022-11-02 NOTE — Telephone Encounter (Signed)
Referral to nephrology Received: Bobbie Stack Jonny Ruiz, LPN FYI,  Patient did not return multiple calls from [facility] and patient did not respond to letter they sent asking them to call and schedule an appointment.  Therefore, [facility] has closed this referral.   Will send this message as an FYI to Dr. Shari Prows, to make her aware of referral closed out, due to pts non-compliance with answering/returning a call back to Nephrology, as indicated above.

## 2022-11-18 NOTE — Progress Notes (Unsigned)
Office Visit    Patient Name: Patrick [name] Date of Encounter: 11/18/2022  Primary Care Provider:  Georgina Quint, MD Primary Cardiologist:  None Primary Electrophysiologist: None   Past Medical History    Past Medical History:  Diagnosis Date   Atrial fibrillation [facility])    CAD (coronary artery disease)    Chronic anticoagulation, on coumadin 07/17/2013   CKD (chronic kidney disease), stage II    CVA (cerebral infarction) 11/22/10   Thalamic with residual memory loss and slow speech   Depression    Diabetes mellitus type II    Insulin dependent   GERD (gastroesophageal reflux disease)    Gout    Hyperlipidemia    Hypertension    Myocardial infarction (HCC) 2000's   "near heart attack" (07/14/2013)   Paroxysmal atrial fibrillation (HCC)    on coumadin   Permanent atrial fibrillation (HCC) 07/17/2013   Shortness of breath    "can happen at any time" (07/14/2013)   Sleep apnea 07/2010   "has mask at home; seldom uses it" (07/14/2013)   Stomach ulcer 1950's   "as a teenager"   Stroke (HCC) ~ 2007; ~ 2009   "memory not as good since" (07/14/2013)   Tachy-brady syndrome (HCC) 07/17/2013   Upper GI bleeding 07/01/2013   Past Surgical History:  Procedure Laterality Date   CARDIAC CATHETERIZATION  07/2003   Neta Mends notes 08/27/2005 (07/01/2013)    Allergies  No Known Allergies   History of Present Illness    Patrick [name] is a 77 y.o. male with PMH of permanent AF (on Coumadin), CAD s/p MI, IDDM  type II, CKD stage IV, HFrEF, GERD HTN, HLD, OSA, thalamic CVA 2012 who presents today for 37-month follow-up.  She previously had not been followed by cardiology since 2017 and reestablish care with Dr. Shari Prows on 07/05/2022. During follow-up patient reported feeling okay overall but does have chronic lower extremity edema and blood pressure was elevated during visit. He was noncompliant due to confusion with his Eliquis and carvedilol. He was also not on  antihypertensives due to frequent urination. Previous 2D echo completed in 2017 showed EF of 40-45% and severe LVH with mild AR. Patient declined Lasix due to possible harm to his kidneys and frequent urination. TTE was ordered and patient was started back on hydralazine. ACE/ARB/spironolactone was avoided due to CKD stage IV. He was referred to nephrology due to CKD stage IV. 2D echo revealed improved EF of 50-55% with no RWMA and severe concentric LVH with normal LV systolic function and massively dilated LA with mild MR, moderate to severe annular calcification with dilated aortic root of 40 mm.   Mr. Catalfamo presents today with his son and wife for 30-month follow-up.  Since last being seen in the office patient reports that he is no experienced any new cardiac complaints.  His blood pressure today is 158/62 and heart rate was 69 bpm.  He reports compliance with his current medications and denies any adverse reactions.  He is volume up on examination by his lower extremities which have 2+ pitting edema.  He is been very inactive and is currently wanting to increase his physical activity.  He was ordered hydralazine at previous visit however did not start this medication.  He was also not able to connect with the nephrologist for referral.  Patient denies chest pain, palpitations, dyspnea, PND, orthopnea, nausea, vomiting, dizziness, syncope, edema, weight gain, or early satiety.  Home Medications    Current Outpatient Medications  Medication Sig Dispense Refill   allopurinol (ZYLOPRIM) 100 MG tablet Take 0.5 tablets (50 mg total) by mouth every other day. 30 tablet 1   amLODipine (NORVASC) 10 MG tablet Take 1 tablet by mouth once daily 90 tablet 0   carvedilol (COREG) 6.25 MG tablet Take 1 tablet (6.25 mg total) by mouth 2 (two) times daily with a meal. 90 tablet 1   ELIQUIS 5 MG TABS tablet Take 5 mg by mouth 2 (two) times daily.     hydrALAZINE (APRESOLINE) 50 MG tablet Take 1 tablet (50 mg total) by  mouth 3 (three) times daily. 270 tablet 3   No current facility-administered medications for this visit.     Review of Systems  Please see the history of present illness.    (+) Bilateral lower extremity swelling (+) Shortness of breath with heavy exertion  All other systems reviewed and are otherwise negative except as noted above.  Physical Exam    Wt Readings from Last 3 Encounters:  07/05/22 170 lb (77.1 kg)  11/16/21 169 lb (76.7 kg)  03/14/21 159 lb (72.1 kg)   ZO:XWRUE were no vitals filed for this visit.,There is no height or weight on file to calculate BMI.  Constitutional:      Appearance: Healthy appearance. Not in distress.  Neck:     Vascular: JVD normal.  Pulmonary:     Effort: Pulmonary effort is normal.    Breath sounds: No wheezing. No rales. Diminished in the bases Cardiovascular:     Normal rate. Regular rhythm. Normal S1. Normal S2.      Murmurs: There is no murmur.  Edema:    Bilateral +2 pitting edema.  Abdominal:     Palpations: Abdomen is soft non tender. There is no hepatomegaly.  Skin:    General: Skin is warm and dry.  Neurological:     General: No focal deficit present.     Mental Status: Alert and oriented to person, place and time.     Cranial Nerves: Cranial nerves are intact.  EKG/LABS/ Recent Cardiac Studies    ECG personally reviewed by me today -none completed today  Cardiac Studies & Procedures       ECHOCARDIOGRAM  ECHOCARDIOGRAM COMPLETE 08/03/2022  Narrative ECHOCARDIOGRAM REPORT    Patient Name:   Patrick [name]  Date of Exam: 08/03/2022 Medical Rec #:  [id number]     Height:       67.0 in Accession #:    [id number]    Weight:       170.0 lb Date of Birth:  10-Sep-1945      BSA:          1.887 m Patient Age:    76 years      BP:           191/101 mmHg Patient Gender: M             HR:           61 bpm. Exam Location:  [facility]  Procedure: Cardiac Doppler, 2D Echo, Color Doppler and Strain Analysis  Indications:     Heart Failure I50.2  History:        Patient has prior history of Echocardiogram examinations, most recent 11/20/2015. CHF, CAD, Arrythmias:Atrial Fibrillation; Risk Factors:Dyslipidemia, Diabetes and Hypertension.  Sonographer:    Thurman Coyer RDCS Referring Phys: [id number] HEATHER E P[place]  IMPRESSIONS   1. Left ventricular ejection fraction, by estimation, is 50 to 55%. The left ventricle has low normal  function. The left ventricle has no regional wall motion abnormalities. There is severe concentric left ventricular hypertrophy. Left ventricular diastolic function could not be evaluated. 2. Right ventricular systolic function is normal. The right ventricular size is normal. There is mildly elevated pulmonary artery systolic pressure. The estimated right ventricular systolic pressure is 44.0 mmHg. 3. Left atrial size was Massively dilated. 4. The mitral valve is degenerative. Mild mitral valve regurgitation. No evidence of mitral stenosis. Moderate to severe mitral annular calcification. 5. The aortic valve has an indeterminant number of cusps. Aortic valve regurgitation is mild. Aortic valve sclerosis/calcification is present, without any evidence of aortic stenosis. 6. Aortic dilatation noted. There is mild dilatation of the aortic root, measuring 40 mm. There is mild dilatation of the ascending aorta, measuring 39 mm. 7. The inferior vena cava is normal in size with greater than 50% respiratory variability, suggesting right atrial pressure of 3 mmHg. 8. A small pericardial effusion is present. The pericardial effusion is surrounding the apex and circumferential. There is no evidence of cardiac tamponade.  FINDINGS Left Ventricle: Left ventricular ejection fraction, by estimation, is 50 to 55%. The left ventricle has low normal function. The left ventricle has no regional wall motion abnormalities. Global longitudinal strain performed but not reported based on interpreter judgement  due to suboptimal tracking. The left ventricular internal cavity size was normal in size. There is severe concentric left ventricular hypertrophy. Left ventricular diastolic function could not be evaluated due to atrial fibrillation. Left ventricular diastolic function could not be evaluated.  Right Ventricle: The right ventricular size is normal. No increase in right ventricular wall thickness. Right ventricular systolic function is normal. There is mildly elevated pulmonary artery systolic pressure. The tricuspid regurgitant velocity is 3.20 m/s, and with an assumed right atrial pressure of 3 mmHg, the estimated right ventricular systolic pressure is 44.0 mmHg.  Left Atrium: Left atrial size was Massively dilated.  Right Atrium: Right atrial size was normal in size.  Pericardium: A small pericardial effusion is present. The pericardial effusion is surrounding the apex and circumferential. There is no evidence of cardiac tamponade.  Mitral Valve: The mitral valve is degenerative in appearance. There is mild thickening of the mitral valve leaflet(s). Moderate to severe mitral annular calcification. Mild mitral valve regurgitation. No evidence of mitral valve stenosis.  Tricuspid Valve: The tricuspid valve is normal in structure. Tricuspid valve regurgitation is mild . No evidence of tricuspid stenosis.  Aortic Valve: The aortic valve has an indeterminant number of cusps. Aortic valve regurgitation is mild. Aortic valve sclerosis/calcification is present, without any evidence of aortic stenosis.  Pulmonic Valve: The pulmonic valve was normal in structure. Pulmonic valve regurgitation is trivial. No evidence of pulmonic stenosis.  Aorta: Aortic dilatation noted. There is mild dilatation of the aortic root, measuring 40 mm. There is mild dilatation of the ascending aorta, measuring 39 mm.  Venous: The inferior vena cava is normal in size with greater than 50% respiratory variability, suggesting  right atrial pressure of 3 mmHg.  IAS/Shunts: No atrial level shunt detected by color flow Doppler.   LEFT VENTRICLE PLAX 2D LVIDd:         4.50 cm LVIDs:         2.90 cm LV PW:         1.50 cm LV IVS:        1.60 cm LVOT diam:     2.50 cm LVOT Area:     4.91 cm  LV Volumes (MOD) LV  vol d, MOD A2C: 85.2 ml LV vol d, MOD A4C: 111.0 ml LV vol s, MOD A2C: 46.5 ml LV vol s, MOD A4C: 67.5 ml LV SV MOD A2C:     38.7 ml LV SV MOD A4C:     111.0 ml LV SV MOD BP:      40.8 ml  RIGHT VENTRICLE RV Basal diam:  3.40 cm RV Mid diam:    2.60 cm RV S prime:     11.00 cm/s TAPSE (M-mode): 1.4 cm  LEFT ATRIUM              Index        RIGHT ATRIUM           Index LA diam:        4.30 cm  2.28 cm/m   RA Area:     20.50 cm LA Vol (A2C):   137.0 ml 72.59 ml/m  RA Volume:   50.20 ml  26.60 ml/m LA Vol (A4C):   148.0 ml 78.42 ml/m LA Biplane Vol: 146.0 ml 77.36 ml/m  AORTA Ao Root diam: 4.00 cm Ao Asc diam:  3.90 cm  TRICUSPID VALVE TR Peak grad:   41.0 mmHg TR Vmax:        320.00 cm/s  SHUNTS Systemic Diam: 2.50 cm  Armanda Magic MD Electronically signed by Armanda Magic MD Signature Date/Time: 08/03/2022/3:56:57 PM    Final             Risk Assessment/Calculations:    Lab Results  Component Value Date   WBC 4.6 11/16/2021   HGB 10.6 (L) 11/16/2021   HCT 32.3 (L) 11/16/2021   MCV 81.8 11/16/2021   PLT 121.0 (L) 11/16/2021   Lab Results  Component Value Date   CREATININE 3.52 (H) 08/03/2022   BUN 44 (H) 08/03/2022   NA 145 (H) 08/03/2022   K 3.6 08/03/2022   CL 107 (H) 08/03/2022   CO2 20 08/03/2022   Lab Results  Component Value Date   ALT 10 11/16/2021   AST 15 11/16/2021   ALKPHOS 108 11/16/2021   BILITOT 0.4 11/16/2021   Lab Results  Component Value Date   CHOL 162 11/16/2021   HDL 56.20 11/16/2021   LDLCALC 90 11/16/2021   TRIG 79.0 11/16/2021   CHOLHDL 3 11/16/2021    Lab Results  Component Value Date   HGBA1C 6.4 11/16/2021      Assessment & Plan    1.HFimEF: -Patient's previous 2D echo in 2017 showed EF of 40-45% with most recent TTE completed 08/02/2022 with improved EF of 50-55% with concentric LVH and massively dilated LA. -Patient has history of chronic lower extremity edema and refused Lasix due to injury -GDMT limited by CKD and patient currently on carvedilol 6.25 mg daily -He is euvolemic with chronic lower extremity swelling 2+ -Low sodium diet, fluid restriction <2L, and daily weights encouraged. Educated to contact our office for weight gain of 2 lbs overnight or 5 lbs in one week.    2.  Permanent atrial fibrillation: -He is currently on Eliquis 5 mg twice daily -Patient's creatinine from 3 months ago was 3.5 to and BMET will be repeated today. -Continue carvedilol 6.25 mg twice daily   3.  Essential hypertension: -Patient had previous elevated blood pressures and today blood pressure is elevated at 158/62 -Patient's blood pressure was elevated at previous visit and started on hydralazine 3 times daily however patient never picked up prescription from pharmacy. -We will have patient start hydralazine  50 mg and continue carvedilol 6.25 mg, Norvasc 10 mg daily -He was advised to continue to check blood pressures and contact office with the readings.   4.  History of CVA: -Patient suffered thalamic CVA and is currently not on statin due to questionable side effects -continue Eliquis 5 mg twice daily -Patient is interested in increasing physical activity however has residual deficit on the left side of his body. -We will place referral for PT/OT in home.   5.  CKD stage IV: -Patient was previously referred to nephrology but was unable to get scheduled for appointment. -Patient's creatinine was elevated in January and we will send new referral to establish care with nephrology   6.  Nonobstructive CAD: -Patient's last ischemic evaluation completed stress test in 2012 by adenosine Cardiolite that  showed no evidence of ischemia -Today patient reports no recurrence of chest pain or anginal equivalent. -Continue carvedilol 6.25 mg daily    Disposition: Follow-up with None or APP in 3 months    Medication Adjustments/Labs and Tests Ordered: Current medicines are reviewed at length with the patient today.  Concerns regarding medicines are outlined above.   Signed, Napoleon Form, Leodis Rains, NP 11/18/2022, 12:25 PM [facility]

## 2022-11-19 ENCOUNTER — Ambulatory Visit: Payer: Medicare Other | Attending: Nurse Practitioner | Admitting: Nurse Practitioner

## 2022-11-19 ENCOUNTER — Encounter: Payer: Self-pay | Admitting: Nurse Practitioner

## 2022-11-19 VITALS — BP 158/62 | HR 69 | Ht 67.0 in | Wt 174.6 lb

## 2022-11-19 DIAGNOSIS — I5020 Unspecified systolic (congestive) heart failure: Secondary | ICD-10-CM

## 2022-11-19 DIAGNOSIS — I10 Essential (primary) hypertension: Secondary | ICD-10-CM

## 2022-11-19 DIAGNOSIS — N184 Chronic kidney disease, stage 4 (severe): Secondary | ICD-10-CM

## 2022-11-19 DIAGNOSIS — Z8673 Personal history of transient ischemic attack (TIA), and cerebral infarction without residual deficits: Secondary | ICD-10-CM | POA: Diagnosis not present

## 2022-11-19 DIAGNOSIS — I4821 Permanent atrial fibrillation: Secondary | ICD-10-CM

## 2022-11-19 DIAGNOSIS — I2510 Atherosclerotic heart disease of native coronary artery without angina pectoris: Secondary | ICD-10-CM

## 2022-11-19 DIAGNOSIS — I5032 Chronic diastolic (congestive) heart failure: Secondary | ICD-10-CM

## 2022-11-19 MED ORDER — HYDRALAZINE HCL 50 MG PO TABS: 50.0000 mg | ORAL_TABLET | Freq: Three times a day (TID) | ORAL | 3 refills | Status: DC

## 2022-11-19 NOTE — Patient Instructions (Signed)
Medication Instructions:  RESTART HYDRALAZINE Take 1 tablet three times a day  *If you need a refill on your cardiac medications before your next appointment, please call your pharmacy*   Lab Work: None Ordered   Testing/Procedures: None ordered   Follow-Up: At [facility], you and your health needs are our priority.  As part of our continuing mission to provide you with exceptional heart care, we have created designated Provider Care Teams.  These Care Teams include your primary Cardiologist (physician) and Advanced Practice Providers (APPs -  Physician Assistants and Nurse Practitioners) who all work together to provide you with the care you need, when you need it.  We recommend signing up for the patient portal called "MyChart".  Sign up information is provided on this After Visit Summary.  MyChart is used to connect with patients for Virtual Visits (Telemedicine).  Patients are able to view lab/test results, encounter notes, upcoming appointments, etc.  Non-urgent messages can be sent to your provider as well.   To learn more about what you can do with MyChart, go to [email or web address].    Your next appointment:   3 month(s)  Provider:   Robin Searing, NP    Other Instructions Get some compression hose from your local medical supply shop PREP Program handout given  PT/OT referral placed

## 2022-11-29 ENCOUNTER — Telehealth: Payer: Self-pay | Admitting: Cardiology

## 2022-11-29 NOTE — Telephone Encounter (Signed)
[facility], Marylene Land, is calling to get clarification as to why the orders are no longer being put through when they were initiated by our cardiologists. Requesting call back to discuss this.

## 2022-11-29 NOTE — Telephone Encounter (Signed)
Patrick [name] with home health therapy that she should seek verbal orders for Home health/PT/OT from the pts PCP.  Per Gabriel Rung, pt will see PCP at the end of this month and will inquire at that time.  [name] verbalized understanding and agrees with this plan.

## 2022-11-29 NOTE — Telephone Encounter (Signed)
RN Gabriel Rung called requesting for a verbal okay from one of the nurses or doctors for the patient to have home health therapy. Please advise.

## 2022-11-29 NOTE — Telephone Encounter (Signed)
Will send this message to Robin Searing NP and his covering CMA for further review and advisement on home health/PT/OT orders, for Alden Server advised on these at last OV with the pt on 4/22.   Informed Marylene Land at [facility] that both Robin Searing NP and [facility] CMA our out of the office today but I will forward this message to them to further review and follow-up with them, upon return to the office.  Marylene Land verbalized understanding and agrees with this plan.

## 2022-11-30 ENCOUNTER — Telehealth: Payer: Self-pay | Admitting: Nurse Practitioner

## 2022-11-30 NOTE — Telephone Encounter (Signed)
Patrick [name] at [facility] requesting PT orders be updated verbally. Per E. Dick, verbally approved " PT 1 week  for 1 time a week, 2 times a week for 3 weeks and 1 time a week for 3 weeks." Patrick [name] states she will fax a written order as well and states verbal orders are acceptable to schedule treatment.

## 2022-11-30 NOTE — Telephone Encounter (Signed)
Neew Message:     Patrick [name] is calling to get verbal orders for PT. Patient needs PT 1 week  for 1 time a week, 2 times a week for 3 weeks and 1 time a week for 3 weeks please.Marland Kitchen

## 2022-11-30 NOTE — Telephone Encounter (Signed)
Caller is following-up on the status of patient's orders.

## 2022-12-04 NOTE — Telephone Encounter (Signed)
Caller stated she wants to get verbal orders for patient to have home health occupational therapy - 1 time per week for 5 weeks.

## 2022-12-04 NOTE — Telephone Encounter (Signed)
Returned call to Heath at [facility]. Per Robin Searing, NP: OT 1w5 OK.  Marcelino Duster verbalized understanding and expressed appreciation for call.

## 2022-12-10 ENCOUNTER — Telehealth: Payer: Self-pay | Admitting: Cardiology

## 2022-12-10 NOTE — Telephone Encounter (Signed)
Pt c/o medication issue:  1. Name of Medication: carvedilol (COREG) 6.25 MG tablet  ELIQUIS 5 MG TABS tablet   2. How are you currently taking this medication (dosage and times per day)? N/A  3. Are you having a reaction (difficulty breathing--STAT)? No  4. What is your medication issue? [name] a home health physical therapist is calling requesting the office contact the patient to advise him on how to take these medications. Please advise.

## 2022-12-10 NOTE — Telephone Encounter (Signed)
Spoke with [facility] PT, she was assisting the patient with his start up, while going though his medication pt stated he is supposed to take Carvedilol 6.25 mg daily and Eliquis 5 mg daily.  However the note from OV on 11/19/22 stated:   carvedilol 6.25 MG tablet Commonly known as: COREG Take 1 tablet (6.25 mg total) by mouth 2 (two) times daily with a meal. Eliquis 5 MG Tabs tablet Generic drug: apixaban Take 5 mg by mouth 2 (two) times daily.  [name] stated the label on Coreg and Eliquis are printed BID. Called pt left message for the pt to call the clinic.

## 2022-12-12 NOTE — Telephone Encounter (Signed)
Left a message with the pts son to have him call the office back, so that we can go over all his cardiac medications and how he should be taking these.  Son said the pt will be back in 30 mins and to reach out to him then.

## 2022-12-12 NOTE — Telephone Encounter (Signed)
Called and spoke with the pts Son who was sitting beside the pt and placed him on speaker phone.   Endorsed to both parties that he needs to be taking both his coreg and eliquis twice daily and NOT once daily.  Reiterated to them both that the pt should be taking both medications TWICE daily.  Pt education provided to both parties the importance of taking both these meds twice daily.  Both verbalized understanding and agrees with this plan.

## 2022-12-20 ENCOUNTER — Ambulatory Visit: Payer: Medicare Other | Admitting: Adult Health

## 2022-12-21 ENCOUNTER — Encounter: Payer: Self-pay | Admitting: Adult Health

## 2022-12-21 ENCOUNTER — Ambulatory Visit (INDEPENDENT_AMBULATORY_CARE_PROVIDER_SITE_OTHER): Payer: Medicare Other | Admitting: Adult Health

## 2022-12-21 ENCOUNTER — Telehealth: Payer: Self-pay | Admitting: Cardiology

## 2022-12-21 VITALS — BP 135/78 | HR 81 | Temp 97.4°F | Resp 18 | Ht 67.0 in | Wt 177.4 lb

## 2022-12-21 DIAGNOSIS — Z8673 Personal history of transient ischemic attack (TIA), and cerebral infarction without residual deficits: Secondary | ICD-10-CM

## 2022-12-21 DIAGNOSIS — I4821 Permanent atrial fibrillation: Secondary | ICD-10-CM

## 2022-12-21 DIAGNOSIS — E113419 Type 2 diabetes mellitus with severe nonproliferative diabetic retinopathy with macular edema, unspecified eye: Secondary | ICD-10-CM

## 2022-12-21 DIAGNOSIS — E785 Hyperlipidemia, unspecified: Secondary | ICD-10-CM

## 2022-12-21 DIAGNOSIS — Z113 Encounter for screening for infections with a predominantly sexual mode of transmission: Secondary | ICD-10-CM

## 2022-12-21 DIAGNOSIS — I10 Essential (primary) hypertension: Secondary | ICD-10-CM | POA: Diagnosis not present

## 2022-12-21 DIAGNOSIS — M109 Gout, unspecified: Secondary | ICD-10-CM

## 2022-12-21 DIAGNOSIS — Z7689 Persons encountering health services in other specified circumstances: Secondary | ICD-10-CM | POA: Diagnosis not present

## 2022-12-21 DIAGNOSIS — N184 Chronic kidney disease, stage 4 (severe): Secondary | ICD-10-CM

## 2022-12-21 DIAGNOSIS — I5020 Unspecified systolic (congestive) heart failure: Secondary | ICD-10-CM

## 2022-12-21 DIAGNOSIS — Z125 Encounter for screening for malignant neoplasm of prostate: Secondary | ICD-10-CM

## 2022-12-21 DIAGNOSIS — G4733 Obstructive sleep apnea (adult) (pediatric): Secondary | ICD-10-CM

## 2022-12-21 DIAGNOSIS — B351 Tinea unguium: Secondary | ICD-10-CM

## 2022-12-21 NOTE — Telephone Encounter (Signed)
[facility] is calling to let office know that patient wishes to discontinue physical therapy.

## 2022-12-21 NOTE — Telephone Encounter (Signed)
Attempted phone call to [place], PT and left message advising per Robin Searing, NP pt may discontinue PT.  Please call [phone] for any further questions.

## 2022-12-21 NOTE — Patient Instructions (Signed)
Preventive Care 35 Years and Older, Male Preventive care refers to lifestyle choices and visits with your health care provider that can promote health and wellness. Preventive care visits are also called wellness exams. What can I expect for my preventive care visit? Counseling During your preventive care visit, your health care provider may ask about your: Medical history, including: Past medical problems. Family medical history. History of falls. Current health, including: Emotional well-being. Home life and relationship well-being. Sexual activity. Memory and ability to understand (cognition). Lifestyle, including: Alcohol, nicotine or tobacco, and drug use. Access to firearms. Diet, exercise, and sleep habits. Work and work Astronomer. Sunscreen use. Safety issues such as seatbelt and bike helmet use. Physical exam Your health care provider will check your: Height and weight. These may be used to calculate your BMI (body mass index). BMI is a measurement that tells if you are at a healthy weight. Waist circumference. This measures the distance around your waistline. This measurement also tells if you are at a healthy weight and may help predict your risk of certain diseases, such as type 2 diabetes and high blood pressure. Heart rate and blood pressure. Body temperature. Skin for abnormal spots. What immunizations do I need?  Vaccines are usually given at various ages, according to a schedule. Your health care provider will recommend vaccines for you based on your age, medical history, and lifestyle or other factors, such as travel or where you work. What tests do I need? Screening Your health care provider may recommend screening tests for certain conditions. This may include: Lipid and cholesterol levels. Diabetes screening. This is done by checking your blood sugar (glucose) after you have not eaten for a while (fasting). Hepatitis C test. Hepatitis B test. HIV (human  immunodeficiency virus) test. STI (sexually transmitted infection) testing, if you are at risk. Lung cancer screening. Colorectal cancer screening. Prostate cancer screening. Abdominal aortic aneurysm (AAA) screening. You may need this if you are a current or former smoker. Talk with your health care provider about your test results, treatment options, and if necessary, the need for more tests. Follow these instructions at home: Eating and drinking  Eat a diet that includes fresh fruits and vegetables, whole grains, lean protein, and low-fat dairy products. Limit your intake of foods with high amounts of sugar, saturated fats, and salt. Take vitamin and mineral supplements as recommended by your health care provider. Do not drink alcohol if your health care provider tells you not to drink. If you drink alcohol: Limit how much you have to 0-2 drinks a day. Know how much alcohol is in your drink. In the [place] one drink equals one 12 oz bottle of beer (355 mL), one 5 oz glass of wine (148 mL), or one 1 oz glass of hard liquor (44 mL). Lifestyle Brush your teeth every morning and night with fluoride toothpaste. Floss one time each day. Exercise for at least 30 minutes 5 or more days each week. Do not use any products that contain nicotine or tobacco. These products include cigarettes, chewing tobacco, and vaping devices, such as e-cigarettes. If you need help quitting, ask your health care provider. Do not use drugs. If you are sexually active, practice safe sex. Use a condom or other form of protection to prevent STIs. Take aspirin only as told by your health care provider. Make sure that you understand how much to take and what form to take. Work with your health care provider to find out whether it is safe  and beneficial for you to take aspirin daily. Ask your health care provider if you need to take a cholesterol-lowering medicine (statin). Find healthy ways to manage stress, such  as: Meditation, yoga, or listening to music. Journaling. Talking to a trusted person. Spending time with friends and family. Safety Always wear your seat belt while driving or riding in a vehicle. Do not drive: If you have been drinking alcohol. Do not ride with someone who has been drinking. When you are tired or distracted. While texting. If you have been using any mind-altering substances or drugs. Wear a helmet and other protective equipment during sports activities. If you have firearms in your house, make sure you follow all gun safety procedures. Minimize exposure to UV radiation to reduce your risk of skin cancer. What's next? Visit your health care provider once a year for an annual wellness visit. Ask your health care provider how often you should have your eyes and teeth checked. Stay up to date on all vaccines. This information is not intended to replace advice given to you by your health care provider. Make sure you discuss any questions you have with your health care provider. Document Revised: 01/11/2021 Document Reviewed: 01/11/2021 Elsevier Patient Education  2024 [employer].

## 2022-12-21 NOTE — Progress Notes (Unsigned)
[facility] clinic  Provider:  Kenard Gower DNP  Code Status:  DNR  Goals of Care:     12/21/2022    1:37 PM  Advanced Directives  Does Patient Have a Medical Advance Directive? Yes  Type of Advance Directive Healthcare Power of Attorney  Does patient want to make changes to medical advance directive? No - Patient declined     Chief Complaint  Patient presents with   Establish Care    New patient to establish care.    HPI: Patient is a 77 y.o. male seen today to establish care with [facility].     Past Medical History:  Diagnosis Date   Atrial fibrillation (HCC)    CAD (coronary artery disease)    Chronic anticoagulation, on coumadin 07/17/2013   CKD (chronic kidney disease), stage II    CVA (cerebral infarction) 11/22/10   Thalamic with residual memory loss and slow speech   Depression    Diabetes mellitus type II    Insulin dependent   GERD (gastroesophageal reflux disease)    Gout    Hyperlipidemia    Hypertension    Myocardial infarction (HCC) 2000's   "near heart attack" (07/14/2013)   Paroxysmal atrial fibrillation (HCC)    on coumadin   Permanent atrial fibrillation (HCC) 07/17/2013   Shortness of breath    "can happen at any time" (07/14/2013)   Sleep apnea 07/2010   "has mask at home; seldom uses it" (07/14/2013)   Stomach ulcer 1950's   "as a teenager"   Stroke (HCC) ~ 2007; ~ 2009   "memory not as good since" (07/14/2013)   Tachy-brady syndrome (HCC) 07/17/2013   Upper GI bleeding 07/01/2013    Past Surgical History:  Procedure Laterality Date   CARDIAC CATHETERIZATION  07/2003   Neta Mends notes 08/27/2005 (07/01/2013)    No Known Allergies  Outpatient Encounter Medications as of 12/21/2022  Medication Sig   allopurinol (ZYLOPRIM) 100 MG tablet Take 0.5 tablets (50 mg total) by mouth every other day.   amLODipine (NORVASC) 10 MG tablet Take 1 tablet by mouth once daily   carvedilol (COREG) 6.25 MG tablet Take 1 tablet (6.25 mg total) by mouth 2  (two) times daily with a meal.   ELIQUIS 5 MG TABS tablet Take 5 mg by mouth 2 (two) times daily.   hydrALAZINE (APRESOLINE) 50 MG tablet Take 1 tablet (50 mg total) by mouth 3 (three) times daily.   No facility-administered encounter medications on file as of 12/21/2022.    Review of Systems:  Review of Systems  Constitutional:  Negative for activity change, appetite change and fever.  HENT:  Negative for sore throat.   Eyes: Negative.   Cardiovascular:  Negative for chest pain and leg swelling.  Gastrointestinal:  Negative for abdominal distention, diarrhea and vomiting.  Genitourinary:  Negative for dysuria, frequency and urgency.  Skin:  Negative for color change.  Neurological:  Negative for dizziness and headaches.  Psychiatric/Behavioral:  Negative for behavioral problems and sleep disturbance. The patient is not nervous/anxious.     Health Maintenance  Topic Date Due   COVID-19 Vaccine (1) Never done   Hepatitis C Screening  Never done   DTaP/Tdap/Td (1 - Tdap) Never done   Zoster Vaccines- Shingrix (1 of 2) Never done   Pneumonia Vaccine 22+ Years old (2 of 2 - PCV) 05/26/2010   OPHTHALMOLOGY EXAM  07/01/2015   Diabetic kidney evaluation - Urine ACR  10/18/2015   Colonoscopy  12/03/2017   HEMOGLOBIN  A1C  02/15/2022   Medicare Annual Wellness (AWV)  05/08/2022   LIPID PANEL  11/17/2022   INFLUENZA VACCINE  02/28/2023   Diabetic kidney evaluation - eGFR measurement  08/04/2023   FOOT EXAM  12/21/2023   HPV VACCINES  Aged Out    Physical Exam: Vitals:   12/21/22 1339  BP: 135/78  Pulse: 81  Resp: 18  Temp: (!) 97.4 F (36.3 C)  SpO2: 98%  Weight: 177 lb 6.4 oz (80.5 kg)  Height: 5\' 7"  (1.702 m)   Body mass index is 27.78 kg/m. Physical Exam Constitutional:      Appearance: Normal appearance.  HENT:     Head: Normocephalic and atraumatic.     Mouth/Throat:     Mouth: Mucous membranes are moist.  Eyes:     Conjunctiva/sclera: Conjunctivae normal.   Cardiovascular:     Rate and Rhythm: Normal rate and regular rhythm.     Pulses: Normal pulses.     Heart sounds: Normal heart sounds.  Pulmonary:     Effort: Pulmonary effort is normal.     Breath sounds: Normal breath sounds.  Abdominal:     General: Bowel sounds are normal.     Palpations: Abdomen is soft.  Musculoskeletal:        General: No swelling. Normal range of motion.     Cervical back: Normal range of motion.     Right lower leg: Edema present.     Left lower leg: Edema present.     Comments: BLE 2+edema  Skin:    General: Skin is warm and dry.  Neurological:     General: No focal deficit present.     Mental Status: He is alert and oriented to person, place, and time.  Psychiatric:        Mood and Affect: Mood normal.        Behavior: Behavior normal.        Thought Content: Thought content normal.        Judgment: Judgment normal.     Labs reviewed: Basic Metabolic Panel: Recent Labs    08/03/22 1322  NA 145*  K 3.6  CL 107*  CO2 20  GLUCOSE 93  BUN 44*  CREATININE 3.52*  CALCIUM 8.3*   Liver Function Tests: No results for input(s): "AST", "ALT", "ALKPHOS", "BILITOT", "PROT", "ALBUMIN" in the last 8760 hours. No results for input(s): "LIPASE", "AMYLASE" in the last 8760 hours. No results for input(s): "AMMONIA" in the last 8760 hours. CBC: No results for input(s): "WBC", "NEUTROABS", "HGB", "HCT", "MCV", "PLT" in the last 8760 hours. Lipid Panel: No results for input(s): "CHOL", "HDL", "LDLCALC", "TRIG", "CHOLHDL", "LDLDIRECT" in the last 8760 hours. Lab Results  Component Value Date   HGBA1C 6.4 11/16/2021    Procedures since last visit: No results found.  Assessment/Plan     Labs/tests ordered:    Next appt:  Visit date not found

## 2022-12-22 LAB — HEMOGLOBIN A1C: Hgb A1c MFr Bld: 6.2 % of total Hgb — ABNORMAL HIGH (ref <5.7)

## 2022-12-22 LAB — LIPID PANEL
Cholesterol: 169 mg/dL (ref <200)
HDL: 67 mg/dL (ref >=40)

## 2022-12-22 LAB — HEPATITIS C ANTIBODY: Hepatitis C Ab: NONREACTIVE

## 2022-12-25 LAB — COMPLETE METABOLIC PANEL WITH GFR
AG Ratio: 1.3 (calc) (ref 1.0–2.5)
ALT: 15 U/L (ref 9–46)
AST: 17 U/L (ref 10–35)
Albumin: 3.7 g/dL (ref 3.6–5.1)
Alkaline phosphatase (APISO): 128 U/L (ref 35–144)
BUN/Creatinine Ratio: 15 (calc) (ref 6–22)
BUN: 65 mg/dL — ABNORMAL HIGH (ref 7–25)
CO2: 24 mmol/L (ref 20–32)
Calcium: 8 mg/dL — ABNORMAL LOW (ref 8.6–10.3)
Chloride: 107 mmol/L (ref 98–110)
Creat: 4.32 mg/dL — ABNORMAL HIGH (ref 0.70–1.28)
Globulin: 2.8 g/dL (calc) (ref 1.9–3.7)
Glucose, Bld: 99 mg/dL (ref 65–99)
Potassium: 4.1 mmol/L (ref 3.5–5.3)
Sodium: 140 mmol/L (ref 135–146)
Total Bilirubin: 0.5 mg/dL (ref 0.2–1.2)
Total Protein: 6.5 g/dL (ref 6.1–8.1)
eGFR: 14 mL/min/{1.73_m2} — ABNORMAL LOW (ref >=60)

## 2022-12-25 LAB — HEMOGLOBIN A1C
Mean Plasma Glucose: 131 mg/dL
eAG (mmol/L): 7.3 mmol/L

## 2022-12-25 LAB — CBC WITH DIFFERENTIAL/PLATELET
Absolute Monocytes: 414 cells/uL (ref 200–950)
Basophils Absolute: 32 cells/uL (ref 0–200)
Basophils Relative: 0.7 %
Eosinophils Absolute: 1104 cells/uL — ABNORMAL HIGH (ref 15–500)
Eosinophils Relative: 24 %
HCT: 27 % — ABNORMAL LOW (ref 38.5–50.0)
Hemoglobin: 8.5 g/dL — ABNORMAL LOW (ref 13.2–17.1)
Lymphs Abs: 750 cells/uL — ABNORMAL LOW (ref 850–3900)
MCH: 26.5 pg — ABNORMAL LOW (ref 27.0–33.0)
MCHC: 31.5 g/dL — ABNORMAL LOW (ref 32.0–36.0)
MCV: 84.1 fL (ref 80.0–100.0)
MPV: 12.3 fL (ref 7.5–12.5)
Monocytes Relative: 9 %
Neutro Abs: 2300 cells/uL (ref 1500–7800)
Neutrophils Relative %: 50 %
Platelets: 153 10*3/uL (ref 140–400)
RBC: 3.21 10*6/uL — ABNORMAL LOW (ref 4.20–5.80)
RDW: 14.5 % (ref 11.0–15.0)
Total Lymphocyte: 16.3 %
WBC: 4.6 10*3/uL (ref 3.8–10.8)

## 2022-12-25 LAB — PSA, TOTAL AND FREE
PSA, % Free: 26 % (calc) (ref >25)
PSA, Free: 0.5 ng/mL
PSA, Total: 1.9 ng/mL (ref <=4.0)

## 2022-12-25 LAB — LIPID PANEL
LDL Cholesterol (Calc): 86 mg/dL (calc)
Non-HDL Cholesterol (Calc): 102 mg/dL (calc) (ref <130)
Total CHOL/HDL Ratio: 2.5 (calc) (ref <5.0)
Triglycerides: 74 mg/dL (ref <150)

## 2022-12-25 LAB — HIV ANTIBODY (ROUTINE TESTING W REFLEX): HIV 1&2 Ab, 4th Generation: NONREACTIVE

## 2022-12-26 NOTE — Progress Notes (Signed)
-    hgb 8.5, down from 10.6, will re-check in next visit -  A1c 6.2, improved from 6.4 (11/16/21) -  kidney function stable, chronic kidney disease stage 4 -  PSA and lipid panel normal -  negative HIV and hep c antibody

## 2022-12-31 ENCOUNTER — Other Ambulatory Visit: Payer: Self-pay

## 2022-12-31 MED ORDER — ELIQUIS 5 MG PO TABS: 5.0000 mg | ORAL_TABLET | Freq: Two times a day (BID) | ORAL | 1 refills | Status: DC

## 2023-01-28 ENCOUNTER — Ambulatory Visit (INDEPENDENT_AMBULATORY_CARE_PROVIDER_SITE_OTHER): Payer: Medicare Other | Admitting: Adult Health

## 2023-01-28 ENCOUNTER — Encounter: Payer: Self-pay | Admitting: Adult Health

## 2023-01-28 VITALS — BP 190/90 | HR 63 | Temp 97.3°F | Resp 16 | Ht 67.0 in | Wt 178.0 lb

## 2023-01-28 DIAGNOSIS — I10 Essential (primary) hypertension: Secondary | ICD-10-CM | POA: Diagnosis not present

## 2023-01-28 DIAGNOSIS — Z8673 Personal history of transient ischemic attack (TIA), and cerebral infarction without residual deficits: Secondary | ICD-10-CM

## 2023-01-28 DIAGNOSIS — I5020 Unspecified systolic (congestive) heart failure: Secondary | ICD-10-CM

## 2023-01-28 DIAGNOSIS — E113419 Type 2 diabetes mellitus with severe nonproliferative diabetic retinopathy with macular edema, unspecified eye: Secondary | ICD-10-CM | POA: Diagnosis not present

## 2023-01-28 DIAGNOSIS — N184 Chronic kidney disease, stage 4 (severe): Secondary | ICD-10-CM

## 2023-01-28 DIAGNOSIS — M109 Gout, unspecified: Secondary | ICD-10-CM

## 2023-01-28 DIAGNOSIS — I4821 Permanent atrial fibrillation: Secondary | ICD-10-CM

## 2023-01-28 MED ORDER — CLONIDINE HCL 0.1 MG PO TABS
0.1000 mg | ORAL_TABLET | ORAL | Status: AC
Start: 2023-01-28 — End: 2023-01-29

## 2023-01-28 NOTE — Progress Notes (Signed)
[facility] clinic  Provider:  Kenard Gower DNP  Code Status:  DNR Full medical care and pressors OK  Goals of Care:     01/28/2023    2:34 PM  Advanced Directives  Does Patient Have a Medical Advance Directive? Yes  Type of Advance Directive Healthcare Power of Attorney  Does patient want to make changes to medical advance directive? No - Patient declined     Chief Complaint  Patient presents with   Medical Management of Chronic Issues    Patient is here for a follow up Has several care gaps that need closing Elevated blood pressure has not taken meds today    HPI: Patient is a 77 y.o. male seen today for medical management of chronic issues. He was accompanied by son and whife today.  Uncontrolled hypertension - BP 190/90, 190/100, has not take his medications today. 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, without long-term current use of insulin, unspecified laterality (HCC) -  A1C 6.2, not on medication  Chronic kidney disease (CKD), stage IV (severe) (HCC) -  follows up with [facility]  Gout, unspecified cause, unspecified chronicity, unspecified site -  no gout flares, takes Allopurinol  Systolic heart failure, unspecified HF chronicity (HCC) -  no shortness of breath, takes Coreg and Hydralazine   Past Medical History:  Diagnosis Date   Atrial fibrillation (HCC)    CAD (coronary artery disease)    Chronic anticoagulation, on coumadin 07/17/2013   CKD (chronic kidney disease), stage II    CVA (cerebral infarction) 11/22/10   Thalamic with residual memory loss and slow speech   Depression    Diabetes mellitus type II    Insulin dependent   GERD (gastroesophageal reflux disease)    Gout    Hyperlipidemia    Hypertension    Myocardial infarction (HCC)  2000's   "near heart attack" (07/14/2013)   Paroxysmal atrial fibrillation (HCC)    on coumadin   Permanent atrial fibrillation (HCC) 07/17/2013   Shortness of breath    "can happen at any time" (07/14/2013)   Sleep apnea 07/2010   "has mask at home; seldom uses it" (07/14/2013)   Stomach ulcer 1950's   "as a teenager"   Stroke (HCC) ~ 2007; ~ 2009   "memory not as good since" (07/14/2013)   Tachy-brady syndrome (HCC) 07/17/2013   Upper GI bleeding 07/01/2013    Past Surgical History:  Procedure Laterality Date   CARDIAC CATHETERIZATION  07/2003   Neta Mends notes 08/27/2005 (07/01/2013)    No Known Allergies  Outpatient Encounter Medications as of 01/28/2023  Medication Sig   allopurinol (ZYLOPRIM) 100 MG tablet Take 0.5 tablets (50 mg total) by mouth every other day.   amLODipine (NORVASC) 10 MG tablet Take 1 tablet by mouth once daily   carvedilol (COREG) 6.25 MG tablet Take 1 tablet (6.25 mg total) by mouth 2 (two) times daily with a meal.   ELIQUIS 5 MG TABS tablet Take 1 tablet (5 mg total) by mouth 2 (two) times daily.   hydrALAZINE (APRESOLINE) 50 MG tablet Take 1 tablet (50 mg total) by mouth 3 (three) times daily.   No facility-administered encounter medications on file as of 01/28/2023.    Review of Systems:  Review of Systems  Constitutional:  Negative for activity change, appetite change and fever.  HENT:  Negative for sore throat.   Eyes: Negative.   Cardiovascular:  Negative for chest pain and leg swelling.  Gastrointestinal:  Negative for abdominal distention, diarrhea and vomiting.  Genitourinary:  Negative for dysuria, frequency and urgency.  Skin:  Negative for color change.  Neurological:  Negative for dizziness and headaches.  Psychiatric/Behavioral:  Negative for behavioral problems and sleep disturbance. The patient is not nervous/anxious.     Health Maintenance  Topic Date Due   COVID-19 Vaccine (1) Never done   OPHTHALMOLOGY EXAM  07/01/2015    Diabetic kidney evaluation - Urine ACR  10/18/2015   Colonoscopy  12/03/2017   Medicare Annual Wellness (AWV)  05/08/2022   Zoster Vaccines- Shingrix (1 of 2) 03/28/2023 (Originally 12/01/1964)   Pneumonia Vaccine 25+ Years old (2 of 2 - PCV) 12/26/2023 (Originally 05/26/2010)   INFLUENZA VACCINE  02/28/2023   HEMOGLOBIN A1C  03/23/2023   Diabetic kidney evaluation - eGFR measurement  12/21/2023   FOOT EXAM  12/21/2023   LIPID PANEL  12/21/2023   Hepatitis C Screening  Completed   HPV VACCINES  Aged Out   DTaP/Tdap/Td  Discontinued    Physical Exam: Vitals:   01/28/23 1432 01/28/23 1433  BP: (!) 190/100 (!) 190/90  Pulse:  63  Resp:  16  Temp:  (!) 97.3 F (36.3 C)  SpO2:  99%  Weight: 178 lb (80.7 kg) 178 lb (80.7 kg)  Height: 5\' 7"  (1.702 m) 5\' 7"  (1.702 m)   Body mass index is 27.88 kg/m. Physical Exam Constitutional:      Appearance: Normal appearance.  HENT:     Head: Normocephalic and atraumatic.     Mouth/Throat:     Mouth: Mucous membranes are moist.  Eyes:     Conjunctiva/sclera: Conjunctivae normal.  Cardiovascular:     Rate and Rhythm: Normal rate and regular rhythm.     Pulses: Normal pulses.     Heart sounds: Normal heart sounds.  Pulmonary:     Effort: Pulmonary effort is normal.     Breath sounds: Normal breath sounds.  Abdominal:     General: Bowel sounds are normal.     Palpations: Abdomen is soft.  Musculoskeletal:        General: Swelling present. Normal range of motion.     Cervical back: Normal range of motion.     Right lower leg: Edema present.     Left lower leg: Edema present.     Comments: BLE 2+edema  Skin:    General: Skin is warm and dry.  Neurological:     General: No focal deficit present.     Mental Status: He is alert and oriented to person, place, and time.  Psychiatric:        Mood and Affect: Mood normal.        Behavior: Behavior normal.        Thought Content: Thought content normal.        Judgment: Judgment normal.      Labs reviewed: Basic Metabolic Panel: Recent Labs    08/03/22 1322 12/21/22 1418  NA 145* 140  K 3.6 4.1  CL 107* 107  CO2 20 24  GLUCOSE 93 99  BUN 44* 65*  CREATININE 3.52* 4.32*  CALCIUM 8.3* 8.0*   Liver Function Tests: Recent Labs    12/21/22 1418  AST 17  ALT 15  BILITOT 0.5  PROT 6.5   No results for input(s): "LIPASE", "AMYLASE" in the last 8760 hours. No results  for input(s): "AMMONIA" in the last 8760 hours. CBC: Recent Labs    12/21/22 1418  WBC 4.6  NEUTROABS 2,300  HGB 8.5*  HCT 27.0*  MCV 84.1  PLT 153   Lipid Panel: Recent Labs    12/21/22 1418  CHOL 169  HDL 67  LDLCALC 86  TRIG 74  CHOLHDL 2.5   Lab Results  Component Value Date   HGBA1C 6.2 (H) 12/21/2022    Procedures since last visit: No results found.  Assessment/Plan  1. Uncontrolled hypertension -  BP elevated, 190/90 - cloNIDine (CATAPRES) tablet 0.1 mg PO X 1 -  emphasized importance of taking medications as ordered -  continue Coreg, Hydralazine and Amlodipine -  check BP daily, log and bring to next appointment  2. Permanent atrial fibrillation (HCC) -  rate-controlled -  continue Eliquis and Coreg  3. Type 2 diabetes mellitus with severe nonproliferative retinopathy and macular edema, without long-term current use of insulin, unspecified laterality (HCC) Lab Results  Component Value Date   HGBA1C 6.2 (H) 12/21/2022   -  diet-controlled  4. Chronic kidney disease (CKD), stage IV (severe) (HCC) Lab Results  Component Value Date   NA 140 12/21/2022   K 4.1 12/21/2022   CO2 24 12/21/2022   GLUCOSE 99 12/21/2022   BUN 65 (H) 12/21/2022   CREATININE 4.32 (H) 12/21/2022   CALCIUM 8.0 (L) 12/21/2022   GFR 21.73 (L) 11/16/2021   EGFR 14 (L) 12/21/2022   GFRNONAA 28 (L) 12/31/2019    -  follows up with [facility]  5. Gout, unspecified cause, unspecified chronicity, unspecified site -  stable -  continue Allopurinol  6. Systolic heart failure,  unspecified HF chronicity (HCC) -  no SOB -  continue Coreg and Hydralazine  7. History of stroke -  BPs elevated -  instructed to take medications as ordered -  monitor BP, log and bring to next appointment   Labs/tests ordered:  None  Next appt:  Visit date not found

## 2023-02-11 ENCOUNTER — Ambulatory Visit: Payer: Medicare Other | Admitting: Adult Health

## 2023-02-11 ENCOUNTER — Encounter: Payer: Self-pay | Admitting: Adult Health

## 2023-02-11 VITALS — BP 121/88 | HR 63 | Temp 97.8°F | Resp 18 | Ht 67.0 in | Wt 175.4 lb

## 2023-02-11 DIAGNOSIS — D649 Anemia, unspecified: Secondary | ICD-10-CM

## 2023-02-11 DIAGNOSIS — I5020 Unspecified systolic (congestive) heart failure: Secondary | ICD-10-CM

## 2023-02-11 DIAGNOSIS — I4821 Permanent atrial fibrillation: Secondary | ICD-10-CM

## 2023-02-11 DIAGNOSIS — N184 Chronic kidney disease, stage 4 (severe): Secondary | ICD-10-CM

## 2023-02-11 DIAGNOSIS — Z8673 Personal history of transient ischemic attack (TIA), and cerebral infarction without residual deficits: Secondary | ICD-10-CM | POA: Diagnosis not present

## 2023-02-11 DIAGNOSIS — I10 Essential (primary) hypertension: Secondary | ICD-10-CM | POA: Diagnosis not present

## 2023-02-11 NOTE — Progress Notes (Unsigned)
[facility] clinic  Provider: Kenard Gower DNP  Code Status:  Partial Code  Goals of Care:     02/11/2023    1:57 PM  Advanced Directives  Does Patient Have a Medical Advance Directive? Yes  Type of Advance Directive Healthcare Power of Attorney  Does patient want to make changes to medical advance directive? No - Patient declined     Chief Complaint  Patient presents with   Follow-up    Two weeks follow-up   Quality Metric Gaps    Needs to discuss eye exam, Diabetic kidney evaluation-urine ACR, Colonoscopy, Medicare Annual Wellness Visit and Covid vaccine.    HPI: Patient is a 77 y.o. male seen today for a 2-week follow. He was accompanied by his son and wife.  Essential hypertension -  BP 121/88, takes Amlodipine, Coreg and Hydralazine. He is now taking Hydralazine 3X/day as ordered.  History of stroke -  stable  Chronic kidney disease (CKD), stage IV (severe) (HCC) - GFR 14, creatinine 4.32, BUN 65  Chronic anemia - hgb 8.5  Permanent atrial fibrillation (HCC) -  denies palpitations, takes Eliquis and Carvedilol  Systolic heart failure, unspecified HF chronicity (HCC) -  no shortness of breath, takes Carvedilol and Hydralazine    Past Medical History:  Diagnosis Date   Atrial fibrillation (HCC)    CAD (coronary artery disease)    Chronic anticoagulation, on coumadin 07/17/2013   CKD (chronic kidney disease), stage II    CVA (cerebral infarction) 11/22/10   Thalamic with residual memory loss and slow speech   Depression    Diabetes mellitus type II    Insulin dependent   GERD (gastroesophageal reflux disease)    Gout    Hyperlipidemia    Hypertension    Myocardial infarction (HCC) 2000's   "near heart attack" (07/14/2013)   Paroxysmal atrial fibrillation (HCC)    on coumadin   Permanent atrial fibrillation (HCC) 07/17/2013   Shortness of breath    "can happen at any time" (07/14/2013)   Sleep apnea 07/2010   "has mask at home; seldom uses it"  (07/14/2013)   Stomach ulcer 1950's   "as a teenager"   Stroke (HCC) ~ 2007; ~ 2009   "memory not as good since" (07/14/2013)   Tachy-brady syndrome (HCC) 07/17/2013   Upper GI bleeding 07/01/2013    Past Surgical History:  Procedure Laterality Date   CARDIAC CATHETERIZATION  07/2003   Neta Mends notes 08/27/2005 (07/01/2013)    No Known Allergies  Outpatient Encounter Medications as of 02/11/2023  Medication Sig   allopurinol (ZYLOPRIM) 100 MG tablet Take 0.5 tablets (50 mg total) by mouth every other day.   amLODipine (NORVASC) 10 MG tablet Take 1 tablet by mouth once daily   carvedilol (COREG) 6.25 MG tablet Take 1 tablet (6.25 mg total) by mouth 2 (two) times daily with a meal.   ELIQUIS 5 MG TABS tablet Take 1 tablet (5 mg total) by mouth 2 (two) times daily.   hydrALAZINE (APRESOLINE) 50 MG tablet Take 1 tablet (50 mg total) by mouth 3 (three) times daily.   No facility-administered encounter medications on file as of 02/11/2023.    Review of Systems:  Review of Systems  Constitutional:  Negative for activity change, appetite change and fever.  HENT:  Negative for sore throat.   Eyes: Negative.   Cardiovascular:  Negative for chest pain and leg swelling.  Gastrointestinal:  Negative for abdominal distention, diarrhea and vomiting.  Genitourinary:  Negative for dysuria, frequency and  urgency.  Skin:  Negative for color change.  Neurological:  Negative for dizziness and headaches.  Psychiatric/Behavioral:  Negative for behavioral problems and sleep disturbance. The patient is not nervous/anxious.     Health Maintenance  Topic Date Due   COVID-19 Vaccine (1) Never done   OPHTHALMOLOGY EXAM  07/01/2015   Diabetic kidney evaluation - Urine ACR  10/18/2015   Colonoscopy  12/03/2017   Medicare Annual Wellness (AWV)  05/08/2022   Zoster Vaccines- Shingrix (1 of 2) 03/28/2023 (Originally 12/01/1964)   Pneumonia Vaccine 37+ Years old (2 of 2 - PCV) 12/26/2023 (Originally  05/26/2010)   INFLUENZA VACCINE  02/28/2023   HEMOGLOBIN A1C  03/23/2023   Diabetic kidney evaluation - eGFR measurement  12/21/2023   FOOT EXAM  12/21/2023   LIPID PANEL  12/21/2023   Hepatitis C Screening  Completed   HPV VACCINES  Aged Out   DTaP/Tdap/Td  Discontinued    Physical Exam: Vitals:   02/11/23 1355  BP: 121/88  Pulse: 63  Resp: 18  Temp: 97.8 F (36.6 C)  SpO2: 98%  Weight: 175 lb 6.4 oz (79.6 kg)  Height: 5\' 7"  (1.702 m)   Body mass index is 27.47 kg/m. Physical Exam Constitutional:      General: He is not in acute distress.    Appearance: Normal appearance.  HENT:     Head: Normocephalic and atraumatic.     Mouth/Throat:     Mouth: Mucous membranes are moist.  Eyes:     Conjunctiva/sclera: Conjunctivae normal.  Cardiovascular:     Rate and Rhythm: Normal rate and regular rhythm.     Pulses: Normal pulses.     Heart sounds: Normal heart sounds.  Pulmonary:     Effort: Pulmonary effort is normal.     Breath sounds: Normal breath sounds.  Abdominal:     General: Bowel sounds are normal.     Palpations: Abdomen is soft.  Musculoskeletal:        General: No swelling. Normal range of motion.     Cervical back: Normal range of motion.  Skin:    General: Skin is warm and dry.  Neurological:     Mental Status: He is alert. Mental status is at baseline.  Psychiatric:        Mood and Affect: Mood normal.        Behavior: Behavior normal.     Labs reviewed: Basic Metabolic Panel: Recent Labs    08/03/22 1322 12/21/22 1418  NA 145* 140  K 3.6 4.1  CL 107* 107  CO2 20 24  GLUCOSE 93 99  BUN 44* 65*  CREATININE 3.52* 4.32*  CALCIUM 8.3* 8.0*   Liver Function Tests: Recent Labs    12/21/22 1418  AST 17  ALT 15  BILITOT 0.5  PROT 6.5   No results for input(s): "LIPASE", "AMYLASE" in the last 8760 hours. No results for input(s): "AMMONIA" in the last 8760 hours. CBC: Recent Labs    12/21/22 1418  WBC 4.6  NEUTROABS 2,300  HGB  8.5*  HCT 27.0*  MCV 84.1  PLT 153   Lipid Panel: Recent Labs    12/21/22 1418  CHOL 169  HDL 67  LDLCALC 86  TRIG 74  CHOLHDL 2.5   Lab Results  Component Value Date   HGBA1C 6.2 (H) 12/21/2022    Procedures since last visit: No results found.  Assessment/Plan  1. Essential hypertension -  BP improved -  continue current medications  2. History of  stroke -  stable  3. Chronic kidney disease (CKD), stage IV (severe) (HCC) Lab Results  Component Value Date   NA 140 12/21/2022   K 4.1 12/21/2022   CO2 24 12/21/2022   GLUCOSE 99 12/21/2022   BUN 65 (H) 12/21/2022   CREATININE 4.32 (H) 12/21/2022   CALCIUM 8.0 (L) 12/21/2022   GFR 21.73 (L) 11/16/2021   EGFR 14 (L) 12/21/2022   GFRNONAA 28 (L) 12/31/2019   - Basic Metabolic Panel with eGFR  4. Chronic anemia Lab Results  Component Value Date   HGB 8.5 (L) 12/21/2022    - CBC With Differential/Platelet  5. Permanent atrial fibrillation (HCC) -  rate-controlled -  continue Eliquis and Carvedilol  6. Systolic heart failure, unspecified HF chronicity (HCC) -  stable -  continue Carvedilol and Hydralazine     Labs/tests ordered:    CBC and BMP  Next appt:  Visit date not found

## 2023-02-14 ENCOUNTER — Ambulatory Visit (HOSPITAL_COMMUNITY)
Admission: RE | Admit: 2023-02-14 | Discharge: 2023-02-14 | Disposition: A | Discharge status: Home / Self Care | Payer: Medicare Other | Source: Ambulatory Visit | Attending: Internal Medicine | Admitting: Internal Medicine

## 2023-02-14 ENCOUNTER — Other Ambulatory Visit (HOSPITAL_COMMUNITY): Payer: Self-pay | Admitting: Internal Medicine

## 2023-02-14 DIAGNOSIS — N184 Chronic kidney disease, stage 4 (severe): Secondary | ICD-10-CM

## 2023-02-18 LAB — CBC AND DIFFERENTIAL
HCT: 26 — AB (ref 41–53)
Hemoglobin: 8.4 — AB (ref 13.5–17.5)
Neutrophils Absolute: 1.7
Platelets: 155 10*3/uL (ref 150–400)
WBC: 4.2

## 2023-02-18 LAB — COMPREHENSIVE METABOLIC PANEL
Albumin: 3.8 (ref 3.5–5.0)
Calcium: 8.1 — AB (ref 8.7–10.7)
eGFR: 11

## 2023-02-18 LAB — BASIC METABOLIC PANEL
BUN: 76 — AB (ref 4–21)
CO2: 22 (ref 13–22)
Chloride: 105 (ref 99–108)
Creatinine: 5 — AB (ref 0.6–1.3)
Glucose: 101
Potassium: 4 mEq/L (ref 3.5–5.1)
Sodium: 139 (ref 137–147)

## 2023-02-18 LAB — IRON,TIBC AND FERRITIN PANEL
%SAT: 21
Ferritin: 252
Iron: 39
TIBC: 184
UIBC: 145

## 2023-02-18 LAB — CBC: RBC: 3.23 — AB (ref 3.87–5.11)

## 2023-02-18 LAB — HEPATITIS B SURFACE ANTIGEN: Hepatitis B Surface Ag: NEGATIVE

## 2023-03-01 ENCOUNTER — Other Ambulatory Visit (HOSPITAL_COMMUNITY): Payer: Self-pay | Admitting: *Deleted

## 2023-03-11 ENCOUNTER — Encounter (HOSPITAL_COMMUNITY)
Admission: RE | Admit: 2023-03-11 | Discharge: 2023-03-11 | Disposition: A | Discharge status: Home / Self Care | Payer: Medicare Other | Source: Ambulatory Visit | Attending: Internal Medicine | Admitting: Internal Medicine

## 2023-03-11 DIAGNOSIS — N189 Chronic kidney disease, unspecified: Secondary | ICD-10-CM | POA: Diagnosis present

## 2023-03-11 DIAGNOSIS — D631 Anemia in chronic kidney disease: Secondary | ICD-10-CM | POA: Insufficient documentation

## 2023-03-11 MED ORDER — SODIUM CHLORIDE 0.9 % IV SOLN
510.0000 mg | Freq: Once | INTRAVENOUS | Status: AC
  Administered 2023-03-11: 510 mg via INTRAVENOUS
  Filled 2023-03-11: qty 510

## 2023-04-29 ENCOUNTER — Inpatient Hospital Stay (HOSPITAL_COMMUNITY)
Admission: RE | Admit: 2023-04-29 | Discharge: 2023-04-29 | Disposition: A | Discharge status: Home / Self Care | Payer: Medicare Other | Source: Ambulatory Visit | Attending: Internal Medicine | Admitting: Internal Medicine

## 2023-04-29 ENCOUNTER — Encounter (HOSPITAL_COMMUNITY): Payer: Self-pay

## 2023-05-01 LAB — COMPREHENSIVE METABOLIC PANEL
Albumin: 4.1 (ref 3.5–5.0)
Calcium: 9 (ref 8.7–10.7)
eGFR: 12

## 2023-05-01 LAB — IRON,TIBC AND FERRITIN PANEL
%SAT: 26
Ferritin: 455
Iron: 44
TIBC: 171
UIBC: 127

## 2023-05-01 LAB — CBC AND DIFFERENTIAL: Hemoglobin: 8.3 — AB (ref 13.5–17.5)

## 2023-05-01 LAB — BASIC METABOLIC PANEL
BUN: 69 — AB (ref 4–21)
CO2: 24 — AB (ref 13–22)
Chloride: 108 (ref 99–108)
Creatinine: 4.7 — AB (ref 0.6–1.3)
Glucose: 100
Potassium: 4.8 meq/L (ref 3.5–5.1)
Sodium: 141 (ref 137–147)

## 2023-05-24 ENCOUNTER — Ambulatory Visit (INDEPENDENT_AMBULATORY_CARE_PROVIDER_SITE_OTHER): Payer: Medicare Other | Admitting: Adult Health

## 2023-05-24 ENCOUNTER — Encounter: Payer: Self-pay | Admitting: Adult Health

## 2023-05-24 DIAGNOSIS — Z0000 Encounter for general adult medical examination without abnormal findings: Secondary | ICD-10-CM

## 2023-05-24 NOTE — Progress Notes (Signed)
I connected with  Patrick [name] on 05/24/23 by a video enabled telemedicine application and verified that I am speaking with the correct person using two identifiers.   I discussed the limitations of evaluation and management by telemedicine. The patient expressed understanding and agreed to proceed.

## 2023-05-24 NOTE — Patient Instructions (Signed)
  Patrick [name] , Thank you for taking time to come for your Medicare Wellness Visit. I appreciate your ongoing commitment to your health goals. Please review the following plan we discussed and let me know if I can assist you in the future.   These are the goals we discussed:  Goals      Blood Pressure < 140/90     BP Readings from Last 3 Encounters:  03/11/20 (!) 170/97  01/04/20 (!) 148/78  12/31/19 (!) 169/106   Not meeting blood pressure targets     Exercise 3x per week (30 min per time)     - Will do ROM of legs and arms - walk for at least 15 minutes/day X 7 days     HEMOGLOBIN A1C < 7     Lab Results  Component Value Date   HGBA1C 6.1 (A) 12/31/2019   Meeting Hgb A1C target     LDL CALC < 70     Lab Results  Component Value Date   CHOL 128 11/20/2015   HDL 48 11/20/2015   LDLCALC 65 11/20/2015   TRIG 77 11/20/2015   CHOLHDL 2.7 11/20/2015     Discussed importance of annual lipid profile and target for LDL in light of CAD and previous stroke        This is a list of the screening recommended for you and due dates:  Health Maintenance  Topic Date Due   COVID-19 Vaccine (1) Never done   Zoster (Shingles) Vaccine (1 of 2) Never done   Eye exam for diabetics  07/01/2015   Yearly kidney health urinalysis for diabetes  10/18/2015   Colon Cancer Screening  12/03/2017   Flu Shot  02/28/2023   Hemoglobin A1C  03/23/2023   Pneumonia Vaccine (2 of 2 - PCV) 12/26/2023*   Complete foot exam   12/21/2023   Lipid (cholesterol) test  12/21/2023   Yearly kidney function blood test for diabetes  04/30/2024   Medicare Annual Wellness Visit  05/23/2024   Hepatitis C Screening  Completed   HPV Vaccine  Aged Out   DTaP/Tdap/Td vaccine  Discontinued  *Topic was postponed. The date shown is not the original due date.

## 2023-05-24 NOTE — Progress Notes (Signed)
This service is provided via telemedicine  No vital signs collected/recorded due to the encounter was a telemedicine visit.   Location of patient (ex: home, work):  Home   Patient consents to a telephone visit:  Yes, 05/24/23  Location of the provider (ex: office, home):  [facility]  Name of any referring provider:  [name], Margit Banda, NP   Names of all persons participating in the telemedicine service and their role in the encounter: Anjela Cassara B/CMA, [name], [name], NP and patient  Time spent on call:  11 minutes

## 2023-05-24 NOTE — Progress Notes (Signed)
Subjective:   Patrick [name] is a 77 y.o. male who presents for Medicare Annual/Subsequent preventive examination.  Visit Complete: Virtual I connected with  Patrick [name] on 05/24/23 by a video and audio enabled telemedicine application and verified that I am speaking with the correct person using two identifiers.  Patient Location: Home  Provider Location: Office/Clinic  I discussed the limitations of evaluation and management by telemedicine. The patient expressed understanding and agreed to proceed.  Vital Signs: Because this visit was a virtual/telehealth visit, some criteria may be missing or patient reported. Any vitals not documented were not able to be obtained and vitals that have been documented are patient reported.  Patient Medicare AWV questionnaire was completed by the patient on 05/24/23; I have confirmed that all information answered by patient is correct and no changes since this date.  Cardiac Risk Factors include: advanced age (>76men, >109 women);hypertension;male gender;sedentary lifestyle     Objective:    There were no vitals filed for this visit. There is no height or weight on file to calculate BMI.     02/11/2023    1:57 PM 01/28/2023    2:34 PM 12/21/2022    1:37 PM 01/12/2021    2:25 PM 05/04/2020    3:53 PM 03/11/2020    3:54 PM 01/04/2020    1:38 PM  Advanced Directives  Does Patient Have a Medical Advance Directive? Yes Yes Yes No No No No  Type of Advance Directive Healthcare Power of [employer] Power of [employer] Power of Attorney      Does patient want to make changes to medical advance directive? No - Patient declined No - Patient declined No - Patient declined      Would patient like information on creating a medical advance directive?    No - Patient declined No - Patient declined No - Patient declined     Current Medications (verified) Outpatient Encounter Medications as of 05/24/2023  Medication Sig   allopurinol (ZYLOPRIM)  100 MG tablet Take 0.5 tablets (50 mg total) by mouth every other day.   amLODipine (NORVASC) 10 MG tablet Take 1 tablet by mouth once daily   carvedilol (COREG) 6.25 MG tablet Take 1 tablet (6.25 mg total) by mouth 2 (two) times daily with a meal.   ELIQUIS 5 MG TABS tablet Take 1 tablet (5 mg total) by mouth 2 (two) times daily.   hydrALAZINE (APRESOLINE) 50 MG tablet Take 1 tablet (50 mg total) by mouth 3 (three) times daily.   No facility-administered encounter medications on file as of 05/24/2023.    Allergies (verified) Patient has no known allergies.   History: Past Medical History:  Diagnosis Date   Atrial fibrillation (HCC)    CAD (coronary artery disease)    Chronic anticoagulation, on coumadin 07/17/2013   CKD (chronic kidney disease), stage II    CVA (cerebral infarction) 11/22/10   Thalamic with residual memory loss and slow speech   Depression    Diabetes mellitus type II    Insulin dependent   GERD (gastroesophageal reflux disease)    Gout    Hyperlipidemia    Hypertension    Myocardial infarction (HCC) 2000's   "near heart attack" (07/14/2013)   Paroxysmal atrial fibrillation (HCC)    on coumadin   Permanent atrial fibrillation (HCC) 07/17/2013   Shortness of breath    "can happen at any time" (07/14/2013)   Sleep apnea 07/2010   "has mask at home; seldom uses it" (07/14/2013)  Stomach ulcer 1950's   "as a teenager"   Stroke (HCC) ~ 2007; ~ 2009   "memory not as good since" (07/14/2013)   Tachy-brady syndrome (HCC) 07/17/2013   Upper GI bleeding 07/01/2013   Past Surgical History:  Procedure Laterality Date   CARDIAC CATHETERIZATION  07/2003   Neta Mends notes 08/27/2005 (07/01/2013)   Family History  Problem Relation Age of Onset   Diabetes Mother    Hypertension Mother    Heart Problems Father    Hypertension Sister    Diabetes Sister    Diabetes Sister    Diabetes Sister    Cancer Brother    Social History   Socioeconomic History   Marital  status: Married    Spouse name: Dois Davenport   Number of children: 4   Years of education: 5   Highest education level: GED or equivalent  Occupational History   Occupation: retired Theatre stage manager: UNEMPLOYED  Tobacco Use   Smoking status: Former    Current packs/day: 0.00    Average packs/day: 0.5 packs/day for 15.0 years (7.5 ttl pk-yrs)    Types: Cigarettes    Start date: 10/26/1961    Quit date: 10/26/1976    Years since quitting: 46.6   Smokeless tobacco: Former   Tobacco comments:    07/14/2013 "quit chewing and dipping in ~ 1970"  Substance and Sexual Activity   Alcohol use: No    Alcohol/week: 0.0 standard drinks of alcohol    Comment: 07/14/2013 "drank a little; quit in ~ 1970; never had problem w/it"   Drug use: No   Sexual activity: Not Currently  Other Topics Concern   Not on file  Social History Narrative   Former smoker - stopped 1978   Alcohol stopped 1970   Full Code      Current Social History 10/07/2019        Patient lives with spouse Dois Davenport) and 1 son in a two level home.       Patient's method of transportation is via family member (Son or daughter-in-law).      The highest level of education was 5 th grade.      The patient currently retired Scientist, product/process development.      Identified important Relationships are Wife, sons, and grandchildren       Pets : None       Interests / Fun: Watch TV       Current Stressors: No bedroom downstairs. Has to climb 12 steps to bedroom.       Religious / Personal Beliefs: Christian       L. Ducatte, BSN, RN-BC       Social Determinants of Health   Financial Resource Strain: Not on file  Food Insecurity: No Food Insecurity (01/28/2022)   Received from [facility] Vital Sign    Worried About Programme researcher, broadcasting/film/video in the Last Year: Never true    Ran Out of Food in the Last Year: Never true  Transportation Needs: Unmet Transportation Needs (10/12/2019)   PRAPARE -  Administrator, Civil Service (Medical): Yes    Lack of Transportation (Non-Medical): Yes  Physical Activity: Inactive (10/12/2019)   Exercise Vital Sign    Days of Exercise per Week: 0 days    Minutes of Exercise per Session: 0 min  Stress: Not on file  Social Connections: Unknown (10/12/2019)   Social Connection and Isolation Panel [NHANES]  Frequency of Communication with Friends and Family: More than three times a week    Frequency of Social Gatherings with Friends and Family: Not on file    Attends Religious Services: More than 4 times per year    Active Member of [employer] or Organizations: Not on file    Attends Banker Meetings: Not on file    Marital Status: Married    Tobacco Counseling Counseling given: Not Answered Tobacco comments: 07/14/2013 "quit chewing and dipping in ~ 1970"   Clinical Intake:  Pre-visit preparation completed: No  Pain : No/denies pain     BMI - recorded: 27.47 Nutritional Status: BMI 25 -29 Overweight Nutritional Risks: None Diabetes: No  How often do you need to have someone help you when you read instructions, pamphlets, or other written materials from your doctor or pharmacy?: 5 - Always What is the last grade level you completed in school?: Grade 5     Information entered by :: Beatric Fulop [name] DNP   Activities of Daily Living    05/24/2023    1:36 PM 05/24/2023    1:34 PM  In your present state of health, do you have any difficulty performing the following activities:  Hearing? 0   Vision? 1 1  Difficulty concentrating or making decisions?  0  Walking or climbing stairs?  1  Dressing or bathing?  0  Doing errands, shopping?  0  Preparing Food and eating ?  N  Using the Toilet?  N  In the past six months, have you accidently leaked urine?  Y  Do you have problems with loss of bowel control?  N  Managing your Medications?  N  Managing your Finances?  Y  Housekeeping or managing your  Housekeeping?  Y    Patient Care Team: [name], Margit Banda, NP as PCP - General (Internal Medicine) Meriam Sprague, MD (Inactive) as PCP - Cardiology (Cardiology) [place], Heather, OD (Optometry)  Indicate any recent Medical Services you may have received from other than [facility] providers in the past year (date may be approximate).     Assessment:   This is a routine wellness examination for Patrick [name].  Hearing/Vision screen No results found.   Goals Addressed             This Visit's Progress    Exercise 3x per week (30 min per time)       - Will do ROM of legs and arms - walk for at least 15 minutes/day X 7 days       Depression Screen    05/24/2023    1:34 PM 02/11/2023    1:57 PM 12/21/2022    1:35 PM 11/16/2021   12:26 PM 03/14/2021    2:33 PM 01/12/2021    2:25 PM 03/11/2020    3:54 PM  PHQ 2/9 Scores  PHQ - 2 Score 0 0 0 0 1 0 0  PHQ- 9 Score  0 0        Fall Risk    05/24/2023    1:34 PM 02/11/2023    1:57 PM 01/28/2023    2:34 PM 12/21/2022    1:36 PM 11/16/2021   12:26 PM  Fall Risk   Falls in the past year? 0 0 0 0 0  Number falls in past yr: 0 0 0 0 0  Injury with Fall? 0 0 0 0 0  Risk for fall due to :  No Fall Risks No Fall Risks No Fall Risks  Follow up Falls evaluation completed Falls evaluation completed Falls evaluation completed      MEDICARE RISK AT HOME:    TIMED UP AND GO:  Was the test performed?  No    Cognitive Function:        05/24/2023    1:36 PM  6CIT Screen  What Year? 0 points  What month? 0 points  What time? 0 points  Count back from 20 0 points  Months in reverse 4 points  Repeat phrase 10 points  Total Score 14 points    Immunizations Immunization History  Administered Date(s) Administered   Influenza Split 07/05/2011   Influenza Whole 06/09/2008, 05/26/2009, 05/08/2010   Influenza, Seasonal, Injecte, Preservative Fre 08/04/2012   Influenza,inj,Quad PF,6+ Mos 03/29/2014, 08/29/2015    Influenza-Unspecified 03/30/2013   PPD Test 12/09/2015   Pneumococcal Polysaccharide-23 05/26/2009    TDAP status: Due, Education has been provided regarding the importance of this vaccine. Advised may receive this vaccine at local pharmacy or Health Dept. Aware to provide a copy of the vaccination record if obtained from local pharmacy or Health Dept. Verbalized acceptance and understanding.  Flu Vaccine status: Declined, Education has been provided regarding the importance of this vaccine but patient still declined. Advised may receive this vaccine at local pharmacy or Health Dept. Aware to provide a copy of the vaccination record if obtained from local pharmacy or Health Dept. Verbalized acceptance and understanding.  Pneumococcal vaccine status: Declined,  Education has been provided regarding the importance of this vaccine but patient still declined. Advised may receive this vaccine at local pharmacy or Health Dept. Aware to provide a copy of the vaccination record if obtained from local pharmacy or Health Dept. Verbalized acceptance and understanding.   Covid-19 vaccine status: Declined, Education has been provided regarding the importance of this vaccine but patient still declined. Advised may receive this vaccine at local pharmacy or Health Dept.or vaccine clinic. Aware to provide a copy of the vaccination record if obtained from local pharmacy or Health Dept. Verbalized acceptance and understanding.  Qualifies for Shingles Vaccine? Yes   Zostavax completed No   Shingrix Completed?: No.    Education has been provided regarding the importance of this vaccine. Patient has been advised to call insurance company to determine out of pocket expense if they have not yet received this vaccine. Advised may also receive vaccine at local pharmacy or Health Dept. Verbalized acceptance and understanding.  Screening Tests Health Maintenance  Topic Date Due   COVID-19 Vaccine (1) Never done   Zoster  Vaccines- Shingrix (1 of 2) Never done   OPHTHALMOLOGY EXAM  07/01/2015   Diabetic kidney evaluation - Urine ACR  10/18/2015   Colonoscopy  12/03/2017   INFLUENZA VACCINE  02/28/2023   HEMOGLOBIN A1C  03/23/2023   Pneumonia Vaccine 20+ Years old (2 of 2 - PCV) 12/26/2023 (Originally 05/26/2010)   FOOT EXAM  12/21/2023   LIPID PANEL  12/21/2023   Diabetic kidney evaluation - eGFR measurement  04/30/2024   Medicare Annual Wellness (AWV)  05/23/2024   Hepatitis C Screening  Completed   HPV VACCINES  Aged Out   DTaP/Tdap/Td  Discontinued    Health Maintenance  Health Maintenance Due  Topic Date Due   COVID-19 Vaccine (1) Never done   Zoster Vaccines- Shingrix (1 of 2) Never done   OPHTHALMOLOGY EXAM  07/01/2015   Diabetic kidney evaluation - Urine ACR  10/18/2015   Colonoscopy  12/03/2017   INFLUENZA VACCINE  02/28/2023   HEMOGLOBIN  A1C  03/23/2023    Colorectal cancer screening: Declined.   Lung Cancer Screening: (Low Dose CT Chest recommended if Age 23-80 years, 20 pack-year currently smoking OR have quit w/in 15years.) does not qualify.   Lung Cancer Screening Referral: N/A  Additional Screening:  Hepatitis C Screening: does qualify; Completed 12/20/22  Vision Screening: Recommended annual ophthalmology exams for early detection of glaucoma and other disorders of the eye. Is the patient up to date with their annual eye exam?  Yes  Who is the provider or what is the name of the office in which the patient attends annual eye exams? [facility] If pt is not established with a provider, would they like to be referred to a provider to establish care? No .   Dental Screening: Recommended annual dental exams for proper oral hygiene  Diabetic Foot Exam: Not diabetic  Community Resource Referral / Chronic Care Management: CRR required this visit?  No   CCM required this visit?  No     Plan:     I have personally reviewed and noted the following in the patient's  chart:   Medical and social history Use of alcohol, tobacco or illicit drugs  Current medications and supplements including opioid prescriptions. Patient is not currently taking opioid prescriptions. Functional ability and status Nutritional status Physical activity Advanced directives List of other physicians Hospitalizations, surgeries, and ER visits in previous 12 months Vitals Screenings to include cognitive, depression, and falls Referrals and appointments  In addition, I have reviewed and discussed with patient certain preventive protocols, quality metrics, and best practice recommendations. A written personalized care plan for preventive services as well as general preventive health recommendations were provided to patient.     Maitland Lesiak [name], NP   05/24/2023   After Visit Summary: (MyChart) Due to this being a telephonic visit, the after visit summary with patients personalized plan was offered to patient via MyChart   Nurse Notes:  needs to be done annually.

## 2023-05-27 ENCOUNTER — Encounter (HOSPITAL_COMMUNITY): Payer: Medicare Other

## 2023-06-18 LAB — IRON,TIBC AND FERRITIN PANEL
%SAT: 14
Ferritin: 391
Iron: 27
TIBC: 192
UIBC: 165

## 2023-06-18 LAB — BASIC METABOLIC PANEL
BUN: 75 — AB (ref 4–21)
CO2: 21 (ref 13–22)
Chloride: 110 — AB (ref 99–108)
Creatinine: 4.8 — AB (ref 0.6–1.3)
Glucose: 115
Potassium: 4 meq/L (ref 3.5–5.1)
Sodium: 143 (ref 137–147)

## 2023-06-18 LAB — COMPREHENSIVE METABOLIC PANEL
Albumin: 4 (ref 3.5–5.0)
Calcium: 9 (ref 8.7–10.7)
eGFR: 12

## 2023-06-18 LAB — CBC AND DIFFERENTIAL: Hemoglobin: 8.2 — AB (ref 13.5–17.5)

## 2023-06-24 ENCOUNTER — Other Ambulatory Visit (HOSPITAL_COMMUNITY): Payer: Self-pay | Admitting: *Deleted

## 2023-06-25 ENCOUNTER — Ambulatory Visit (HOSPITAL_COMMUNITY)
Admission: RE | Admit: 2023-06-25 | Discharge: 2023-06-25 | Disposition: A | Discharge status: Home / Self Care | Payer: Medicare Other | Source: Ambulatory Visit | Attending: Internal Medicine | Admitting: Internal Medicine

## 2023-06-25 VITALS — BP 172/86 | HR 59 | Temp 97.9°F | Resp 17

## 2023-06-25 DIAGNOSIS — N184 Chronic kidney disease, stage 4 (severe): Secondary | ICD-10-CM | POA: Insufficient documentation

## 2023-06-25 DIAGNOSIS — D649 Anemia, unspecified: Secondary | ICD-10-CM | POA: Insufficient documentation

## 2023-06-25 LAB — POCT HEMOGLOBIN-HEMACUE: Hemoglobin: 8.2 g/dL — ABNORMAL LOW (ref 13.0–17.0)

## 2023-06-25 MED ORDER — SODIUM CHLORIDE 0.9 % IV SOLN
510.0000 mg | Freq: Once | INTRAVENOUS | Status: AC
  Administered 2023-06-25: 510 mg via INTRAVENOUS
  Filled 2023-06-25: qty 510

## 2023-06-25 MED ORDER — EPOETIN ALFA-EPBX 10000 UNIT/ML IJ SOLN: 20000.0000 [IU] | INTRAMUSCULAR | Status: DC

## 2023-06-25 MED ORDER — EPOETIN ALFA-EPBX 10000 UNIT/ML IJ SOLN
INTRAMUSCULAR | Status: AC
  Administered 2023-06-25: 20000 [IU] via SUBCUTANEOUS
  Filled 2023-06-25: qty 2

## 2023-07-23 ENCOUNTER — Encounter (HOSPITAL_COMMUNITY): Payer: Medicare Other

## 2023-07-25 ENCOUNTER — Encounter (HOSPITAL_COMMUNITY): Payer: Self-pay

## 2023-07-25 ENCOUNTER — Inpatient Hospital Stay (HOSPITAL_COMMUNITY): Admission: RE | Admit: 2023-07-25 | Payer: Medicare Other | Source: Ambulatory Visit

## 2023-08-05 ENCOUNTER — Ambulatory Visit (HOSPITAL_COMMUNITY): Payer: Medicare Other

## 2023-08-22 ENCOUNTER — Encounter (HOSPITAL_COMMUNITY): Payer: Medicare Other

## 2023-08-27 ENCOUNTER — Ambulatory Visit (HOSPITAL_COMMUNITY)
Admission: RE | Admit: 2023-08-27 | Discharge: 2023-08-27 | Disposition: A | Discharge status: Home / Self Care | Payer: Medicare Other | Source: Ambulatory Visit | Attending: Internal Medicine | Admitting: Internal Medicine

## 2023-08-27 VITALS — BP 166/90 | HR 56 | Temp 98.1°F | Resp 18

## 2023-08-27 DIAGNOSIS — D649 Anemia, unspecified: Secondary | ICD-10-CM | POA: Insufficient documentation

## 2023-08-27 DIAGNOSIS — N184 Chronic kidney disease, stage 4 (severe): Secondary | ICD-10-CM | POA: Insufficient documentation

## 2023-08-27 LAB — RENAL FUNCTION PANEL
Albumin: 3.2 g/dL — ABNORMAL LOW (ref 3.5–5.0)
Anion gap: 9 (ref 5–15)
BUN: 66 mg/dL — ABNORMAL HIGH (ref 8–23)
CO2: 22 mmol/L (ref 22–32)
Calcium: 8.4 mg/dL — ABNORMAL LOW (ref 8.9–10.3)
Chloride: 105 mmol/L (ref 98–111)
Creatinine, Ser: 4.44 mg/dL — ABNORMAL HIGH (ref 0.61–1.24)
GFR, Estimated: 13 mL/min — ABNORMAL LOW
Glucose, Bld: 98 mg/dL (ref 70–99)
Phosphorus: 3.1 mg/dL (ref 2.5–4.6)
Potassium: 3.8 mmol/L (ref 3.5–5.1)
Sodium: 136 mmol/L (ref 135–145)

## 2023-08-27 LAB — IRON AND TIBC
Iron: 49 ug/dL (ref 45–182)
Saturation Ratios: 27 % (ref 17.9–39.5)
TIBC: 181 ug/dL — ABNORMAL LOW (ref 250–450)
UIBC: 132 ug/dL

## 2023-08-27 LAB — FERRITIN: Ferritin: 336 ng/mL (ref 24–336)

## 2023-08-27 LAB — POCT HEMOGLOBIN-HEMACUE: Hemoglobin: 8.7 g/dL — ABNORMAL LOW (ref 13.0–17.0)

## 2023-08-27 MED ORDER — EPOETIN ALFA-EPBX 10000 UNIT/ML IJ SOLN
20000.0000 [IU] | INTRAMUSCULAR | Status: DC
  Administered 2023-08-27: 20000 [IU] via SUBCUTANEOUS

## 2023-08-27 MED ORDER — EPOETIN ALFA-EPBX 10000 UNIT/ML IJ SOLN
INTRAMUSCULAR | Status: AC
  Filled 2023-08-27: qty 2

## 2023-08-28 LAB — PTH, INTACT AND CALCIUM
Calcium, Total (PTH): 8.4 mg/dL — ABNORMAL LOW (ref 8.6–10.2)
PTH: 389 pg/mL — ABNORMAL HIGH (ref 15–65)

## 2023-09-10 ENCOUNTER — Encounter (HOSPITAL_COMMUNITY)
Admission: RE | Admit: 2023-09-10 | Discharge: 2023-09-10 | Disposition: A | Discharge status: Home / Self Care | Payer: Medicare Other | Source: Ambulatory Visit | Attending: Internal Medicine | Admitting: Internal Medicine

## 2023-09-10 VITALS — BP 171/91 | HR 71 | Temp 97.0°F | Resp 17

## 2023-09-10 DIAGNOSIS — N184 Chronic kidney disease, stage 4 (severe): Secondary | ICD-10-CM | POA: Diagnosis present

## 2023-09-10 DIAGNOSIS — D649 Anemia, unspecified: Secondary | ICD-10-CM | POA: Diagnosis present

## 2023-09-10 LAB — POCT HEMOGLOBIN-HEMACUE: Hemoglobin: 9.6 g/dL — ABNORMAL LOW (ref 13.0–17.0)

## 2023-09-10 MED ORDER — EPOETIN ALFA-EPBX 10000 UNIT/ML IJ SOLN
INTRAMUSCULAR | Status: AC
  Filled 2023-09-10: qty 2

## 2023-09-10 MED ORDER — EPOETIN ALFA-EPBX 10000 UNIT/ML IJ SOLN
20000.0000 [IU] | INTRAMUSCULAR | Status: DC
  Administered 2023-09-10: 20000 [IU] via SUBCUTANEOUS

## 2023-09-23 ENCOUNTER — Other Ambulatory Visit: Payer: Self-pay | Admitting: Nurse Practitioner

## 2023-09-23 DIAGNOSIS — N184 Chronic kidney disease, stage 4 (severe): Secondary | ICD-10-CM

## 2023-09-23 DIAGNOSIS — I4821 Permanent atrial fibrillation: Secondary | ICD-10-CM

## 2023-09-23 DIAGNOSIS — I5020 Unspecified systolic (congestive) heart failure: Secondary | ICD-10-CM

## 2023-09-23 DIAGNOSIS — I10 Essential (primary) hypertension: Secondary | ICD-10-CM

## 2023-09-24 ENCOUNTER — Other Ambulatory Visit: Payer: Self-pay

## 2023-09-24 ENCOUNTER — Encounter (HOSPITAL_COMMUNITY)
Admission: RE | Admit: 2023-09-24 | Discharge: 2023-09-24 | Disposition: A | Discharge status: Home / Self Care | Payer: Medicare Other | Source: Ambulatory Visit | Attending: Internal Medicine | Admitting: Internal Medicine

## 2023-09-24 VITALS — BP 159/94 | HR 71 | Temp 96.9°F | Resp 17

## 2023-09-24 DIAGNOSIS — I10 Essential (primary) hypertension: Secondary | ICD-10-CM

## 2023-09-24 DIAGNOSIS — D649 Anemia, unspecified: Secondary | ICD-10-CM | POA: Diagnosis not present

## 2023-09-24 DIAGNOSIS — N184 Chronic kidney disease, stage 4 (severe): Secondary | ICD-10-CM

## 2023-09-24 LAB — POCT HEMOGLOBIN-HEMACUE: Hemoglobin: 10.2 g/dL — ABNORMAL LOW (ref 13.0–17.0)

## 2023-09-24 LAB — IRON AND TIBC
Iron: 46 ug/dL (ref 45–182)
Saturation Ratios: 26 % (ref 17.9–39.5)
TIBC: 179 ug/dL — ABNORMAL LOW (ref 250–450)
UIBC: 133 ug/dL

## 2023-09-24 LAB — FERRITIN: Ferritin: 293 ng/mL (ref 24–336)

## 2023-09-24 MED ORDER — EPOETIN ALFA-EPBX 10000 UNIT/ML IJ SOLN
INTRAMUSCULAR | Status: AC
  Administered 2023-09-24: 20000 [IU] via SUBCUTANEOUS
  Filled 2023-09-24: qty 2

## 2023-09-24 MED ORDER — EPOETIN ALFA-EPBX 10000 UNIT/ML IJ SOLN: 20000.0000 [IU] | INTRAMUSCULAR | Status: DC

## 2023-09-24 MED ORDER — AMLODIPINE BESYLATE 10 MG PO TABS: 10.0000 mg | ORAL_TABLET | Freq: Every day | ORAL | 0 refills | Status: DC

## 2023-10-08 ENCOUNTER — Encounter (HOSPITAL_COMMUNITY): Payer: Medicare Other

## 2023-10-15 ENCOUNTER — Ambulatory Visit (HOSPITAL_COMMUNITY)
Admission: RE | Admit: 2023-10-15 | Discharge: 2023-10-15 | Disposition: A | Discharge status: Home / Self Care | Source: Ambulatory Visit | Attending: Internal Medicine | Admitting: Internal Medicine

## 2023-10-15 VITALS — BP 175/73 | HR 69 | Temp 97.7°F | Resp 16

## 2023-10-15 DIAGNOSIS — D649 Anemia, unspecified: Secondary | ICD-10-CM | POA: Insufficient documentation

## 2023-10-15 DIAGNOSIS — N184 Chronic kidney disease, stage 4 (severe): Secondary | ICD-10-CM | POA: Insufficient documentation

## 2023-10-15 LAB — POCT HEMOGLOBIN-HEMACUE: Hemoglobin: 10.2 g/dL — ABNORMAL LOW (ref 13.0–17.0)

## 2023-10-15 LAB — RENAL FUNCTION PANEL
Albumin: 3.2 g/dL — ABNORMAL LOW (ref 3.5–5.0)
Anion gap: 7 (ref 5–15)
BUN: 66 mg/dL — ABNORMAL HIGH (ref 8–23)
CO2: 22 mmol/L (ref 22–32)
Calcium: 8 mg/dL — ABNORMAL LOW (ref 8.9–10.3)
Chloride: 108 mmol/L (ref 98–111)
Creatinine, Ser: 4.33 mg/dL — ABNORMAL HIGH (ref 0.61–1.24)
GFR, Estimated: 14 mL/min — ABNORMAL LOW (ref >60)
Glucose, Bld: 104 mg/dL — ABNORMAL HIGH (ref 70–99)
Phosphorus: 3.8 mg/dL (ref 2.5–4.6)
Potassium: 3.5 mmol/L (ref 3.5–5.1)
Sodium: 137 mmol/L (ref 135–145)

## 2023-10-15 LAB — IRON AND TIBC
Iron: 49 ug/dL (ref 45–182)
Saturation Ratios: 29 % (ref 17.9–39.5)
TIBC: 172 ug/dL — ABNORMAL LOW (ref 250–450)
UIBC: 123 ug/dL

## 2023-10-15 LAB — FERRITIN: Ferritin: 258 ng/mL (ref 24–336)

## 2023-10-15 MED ORDER — EPOETIN ALFA-EPBX 10000 UNIT/ML IJ SOLN: 20000.0000 [IU] | INTRAMUSCULAR | Status: DC

## 2023-10-15 MED ORDER — EPOETIN ALFA-EPBX 10000 UNIT/ML IJ SOLN
INTRAMUSCULAR | Status: AC
Start: 2023-10-15 — End: 2023-10-15
  Administered 2023-10-15: 20000 [IU] via SUBCUTANEOUS
  Filled 2023-10-15: qty 2

## 2023-10-16 LAB — PTH, INTACT AND CALCIUM
Calcium, Total (PTH): 8 mg/dL — ABNORMAL LOW (ref 8.6–10.2)
PTH: 355 pg/mL — ABNORMAL HIGH (ref 15–65)

## 2023-10-22 ENCOUNTER — Encounter (HOSPITAL_COMMUNITY)

## 2023-10-29 ENCOUNTER — Encounter (HOSPITAL_COMMUNITY)
Admission: RE | Admit: 2023-10-29 | Discharge: 2023-10-29 | Disposition: A | Discharge status: Home / Self Care | Source: Ambulatory Visit | Attending: Internal Medicine | Admitting: Internal Medicine

## 2023-10-29 VITALS — BP 178/89 | HR 61 | Temp 97.3°F | Resp 16

## 2023-10-29 DIAGNOSIS — N184 Chronic kidney disease, stage 4 (severe): Secondary | ICD-10-CM | POA: Insufficient documentation

## 2023-10-29 DIAGNOSIS — D649 Anemia, unspecified: Secondary | ICD-10-CM | POA: Diagnosis present

## 2023-10-29 LAB — POCT HEMOGLOBIN-HEMACUE: Hemoglobin: 11.5 g/dL — ABNORMAL LOW (ref 13.0–17.0)

## 2023-10-29 MED ORDER — EPOETIN ALFA-EPBX 10000 UNIT/ML IJ SOLN
INTRAMUSCULAR | Status: AC
  Filled 2023-10-29: qty 2

## 2023-10-29 MED ORDER — EPOETIN ALFA-EPBX 10000 UNIT/ML IJ SOLN
20000.0000 [IU] | INTRAMUSCULAR | Status: DC
  Administered 2023-10-29: 20000 [IU] via SUBCUTANEOUS

## 2023-11-12 ENCOUNTER — Encounter (HOSPITAL_COMMUNITY)

## 2023-11-15 ENCOUNTER — Other Ambulatory Visit: Payer: Self-pay | Admitting: Adult Health

## 2023-11-15 DIAGNOSIS — I10 Essential (primary) hypertension: Secondary | ICD-10-CM

## 2023-11-18 ENCOUNTER — Other Ambulatory Visit: Payer: Self-pay | Admitting: Adult Health

## 2023-11-21 ENCOUNTER — Emergency Department (HOSPITAL_COMMUNITY)

## 2023-11-21 ENCOUNTER — Other Ambulatory Visit: Payer: Self-pay

## 2023-11-21 ENCOUNTER — Inpatient Hospital Stay (HOSPITAL_COMMUNITY)

## 2023-11-21 ENCOUNTER — Inpatient Hospital Stay (HOSPITAL_COMMUNITY)
Admission: EM | Admit: 2023-11-21 | Discharge: 2023-11-25 | DRG: 065 | Disposition: A | Discharge status: Home Health | Attending: Internal Medicine | Admitting: Internal Medicine

## 2023-11-21 ENCOUNTER — Encounter (HOSPITAL_COMMUNITY): Payer: Self-pay

## 2023-11-21 DIAGNOSIS — Z7901 Long term (current) use of anticoagulants: Secondary | ICD-10-CM

## 2023-11-21 DIAGNOSIS — N184 Chronic kidney disease, stage 4 (severe): Secondary | ICD-10-CM | POA: Diagnosis present

## 2023-11-21 DIAGNOSIS — R29701 NIHSS score 1: Secondary | ICD-10-CM | POA: Diagnosis present

## 2023-11-21 DIAGNOSIS — G9608 Other cranial cerebrospinal fluid leak: Secondary | ICD-10-CM | POA: Diagnosis present

## 2023-11-21 DIAGNOSIS — I252 Old myocardial infarction: Secondary | ICD-10-CM | POA: Diagnosis not present

## 2023-11-21 DIAGNOSIS — I3139 Other pericardial effusion (noninflammatory): Secondary | ICD-10-CM | POA: Diagnosis present

## 2023-11-21 DIAGNOSIS — I69318 Other symptoms and signs involving cognitive functions following cerebral infarction: Secondary | ICD-10-CM

## 2023-11-21 DIAGNOSIS — I160 Hypertensive urgency: Principal | ICD-10-CM | POA: Diagnosis present

## 2023-11-21 DIAGNOSIS — K219 Gastro-esophageal reflux disease without esophagitis: Secondary | ICD-10-CM | POA: Diagnosis present

## 2023-11-21 DIAGNOSIS — Z87891 Personal history of nicotine dependence: Secondary | ICD-10-CM

## 2023-11-21 DIAGNOSIS — R7989 Other specified abnormal findings of blood chemistry: Secondary | ICD-10-CM | POA: Diagnosis not present

## 2023-11-21 DIAGNOSIS — I10 Essential (primary) hypertension: Secondary | ICD-10-CM

## 2023-11-21 DIAGNOSIS — N179 Acute kidney failure, unspecified: Secondary | ICD-10-CM | POA: Diagnosis present

## 2023-11-21 DIAGNOSIS — D631 Anemia in chronic kidney disease: Secondary | ICD-10-CM | POA: Diagnosis present

## 2023-11-21 DIAGNOSIS — I63441 Cerebral infarction due to embolism of right cerebellar artery: Secondary | ICD-10-CM | POA: Diagnosis not present

## 2023-11-21 DIAGNOSIS — M109 Gout, unspecified: Secondary | ICD-10-CM | POA: Diagnosis present

## 2023-11-21 DIAGNOSIS — F0153 Vascular dementia, unspecified severity, with mood disturbance: Secondary | ICD-10-CM | POA: Diagnosis present

## 2023-11-21 DIAGNOSIS — I4821 Permanent atrial fibrillation: Secondary | ICD-10-CM | POA: Diagnosis present

## 2023-11-21 DIAGNOSIS — E854 Organ-limited amyloidosis: Secondary | ICD-10-CM | POA: Diagnosis present

## 2023-11-21 DIAGNOSIS — I680 Cerebral amyloid angiopathy: Secondary | ICD-10-CM | POA: Diagnosis present

## 2023-11-21 DIAGNOSIS — I130 Hypertensive heart and chronic kidney disease with heart failure and stage 1 through stage 4 chronic kidney disease, or unspecified chronic kidney disease: Secondary | ICD-10-CM | POA: Diagnosis present

## 2023-11-21 DIAGNOSIS — I639 Cerebral infarction, unspecified: Secondary | ICD-10-CM | POA: Diagnosis present

## 2023-11-21 DIAGNOSIS — T45516A Underdosing of anticoagulants, initial encounter: Secondary | ICD-10-CM | POA: Diagnosis present

## 2023-11-21 DIAGNOSIS — I2510 Atherosclerotic heart disease of native coronary artery without angina pectoris: Secondary | ICD-10-CM | POA: Diagnosis present

## 2023-11-21 DIAGNOSIS — I5022 Chronic systolic (congestive) heart failure: Secondary | ICD-10-CM | POA: Diagnosis present

## 2023-11-21 DIAGNOSIS — G4733 Obstructive sleep apnea (adult) (pediatric): Secondary | ICD-10-CM | POA: Diagnosis present

## 2023-11-21 DIAGNOSIS — Z79899 Other long term (current) drug therapy: Secondary | ICD-10-CM

## 2023-11-21 DIAGNOSIS — E785 Hyperlipidemia, unspecified: Secondary | ICD-10-CM | POA: Diagnosis present

## 2023-11-21 DIAGNOSIS — N189 Chronic kidney disease, unspecified: Secondary | ICD-10-CM | POA: Diagnosis not present

## 2023-11-21 DIAGNOSIS — Z91138 Patient's unintentional underdosing of medication regimen for other reason: Secondary | ICD-10-CM

## 2023-11-21 DIAGNOSIS — I161 Hypertensive emergency: Secondary | ICD-10-CM | POA: Diagnosis not present

## 2023-11-21 DIAGNOSIS — E876 Hypokalemia: Secondary | ICD-10-CM | POA: Diagnosis present

## 2023-11-21 DIAGNOSIS — I63111 Cerebral infarction due to embolism of right vertebral artery: Principal | ICD-10-CM | POA: Diagnosis present

## 2023-11-21 DIAGNOSIS — I5020 Unspecified systolic (congestive) heart failure: Secondary | ICD-10-CM

## 2023-11-21 DIAGNOSIS — G459 Transient cerebral ischemic attack, unspecified: Secondary | ICD-10-CM | POA: Diagnosis not present

## 2023-11-21 DIAGNOSIS — E1122 Type 2 diabetes mellitus with diabetic chronic kidney disease: Secondary | ICD-10-CM | POA: Diagnosis present

## 2023-11-21 DIAGNOSIS — Z91198 Patient's noncompliance with other medical treatment and regimen for other reason: Secondary | ICD-10-CM

## 2023-11-21 LAB — BASIC METABOLIC PANEL WITH GFR
Anion gap: 15 (ref 5–15)
BUN: 89 mg/dL — ABNORMAL HIGH (ref 8–23)
CO2: 19 mmol/L — ABNORMAL LOW (ref 22–32)
Calcium: 8 mg/dL — ABNORMAL LOW (ref 8.9–10.3)
Chloride: 109 mmol/L (ref 98–111)
Creatinine, Ser: 5.26 mg/dL — ABNORMAL HIGH (ref 0.61–1.24)
GFR, Estimated: 11 mL/min — ABNORMAL LOW (ref >60)
Glucose, Bld: 117 mg/dL — ABNORMAL HIGH (ref 70–99)
Potassium: 3.3 mmol/L — ABNORMAL LOW (ref 3.5–5.1)
Sodium: 143 mmol/L (ref 135–145)

## 2023-11-21 LAB — CBC WITH DIFFERENTIAL/PLATELET
Abs Immature Granulocytes: 0.02 10*3/uL (ref 0.00–0.07)
Basophils Absolute: 0.1 10*3/uL (ref 0.0–0.1)
Basophils Relative: 1 %
Eosinophils Absolute: 0.4 10*3/uL (ref 0.0–0.5)
Eosinophils Relative: 9 %
HCT: 34.7 % — ABNORMAL LOW (ref 39.0–52.0)
Hemoglobin: 10.7 g/dL — ABNORMAL LOW (ref 13.0–17.0)
Immature Granulocytes: 0 %
Lymphocytes Relative: 10 %
Lymphs Abs: 0.4 10*3/uL — ABNORMAL LOW (ref 0.7–4.0)
MCH: 26.2 pg (ref 26.0–34.0)
MCHC: 30.8 g/dL (ref 30.0–36.0)
MCV: 85 fL (ref 80.0–100.0)
Monocytes Absolute: 0.5 10*3/uL (ref 0.1–1.0)
Monocytes Relative: 10 %
Neutro Abs: 3.3 10*3/uL (ref 1.7–7.7)
Neutrophils Relative %: 70 %
Platelets: 153 10*3/uL (ref 150–400)
RBC: 4.08 MIL/uL — ABNORMAL LOW (ref 4.22–5.81)
RDW: 15.2 % (ref 11.5–15.5)
WBC: 4.6 10*3/uL (ref 4.0–10.5)
nRBC: 0 % (ref 0.0–0.2)

## 2023-11-21 LAB — URINALYSIS, W/ REFLEX TO CULTURE (INFECTION SUSPECTED)
Bacteria, UA: NONE SEEN
Bilirubin Urine: NEGATIVE
Glucose, UA: 50 mg/dL — AB
Ketones, ur: 5 mg/dL — AB
Leukocytes,Ua: NEGATIVE
Nitrite: NEGATIVE
Protein, ur: >=300 mg/dL — AB
Specific Gravity, Urine: 1.011 (ref 1.005–1.030)
pH: 6 (ref 5.0–8.0)

## 2023-11-21 LAB — PROTIME-INR
INR: 1.2 (ref 0.8–1.2)
Prothrombin Time: 15.5 s — ABNORMAL HIGH (ref 11.4–15.2)

## 2023-11-21 LAB — TROPONIN I (HIGH SENSITIVITY)
Troponin I (High Sensitivity): 118 ng/L (ref <18)
Troponin I (High Sensitivity): 119 ng/L (ref <18)

## 2023-11-21 MED ORDER — HYDRALAZINE HCL 20 MG/ML IJ SOLN
10.0000 mg | Freq: Once | INTRAMUSCULAR | Status: AC
  Administered 2023-11-21: 10 mg via INTRAVENOUS
  Filled 2023-11-21: qty 1

## 2023-11-21 MED ORDER — CARVEDILOL 3.125 MG PO TABS: 6.2500 mg | ORAL_TABLET | Freq: Once | ORAL | Status: DC

## 2023-11-21 MED ORDER — LEVALBUTEROL HCL 0.63 MG/3ML IN NEBU: 0.6300 mg | INHALATION_SOLUTION | Freq: Four times a day (QID) | RESPIRATORY_TRACT | Status: DC | PRN

## 2023-11-21 MED ORDER — LABETALOL HCL 5 MG/ML IV SOLN
20.0000 mg | Freq: Once | INTRAVENOUS | Status: AC
  Administered 2023-11-21: 20 mg via INTRAVENOUS
  Filled 2023-11-21: qty 4

## 2023-11-21 MED ORDER — ONDANSETRON HCL 4 MG PO TABS: 4.0000 mg | ORAL_TABLET | Freq: Four times a day (QID) | ORAL | Status: DC | PRN

## 2023-11-21 MED ORDER — AMLODIPINE BESYLATE 10 MG PO TABS
10.0000 mg | ORAL_TABLET | Freq: Every day | ORAL | Status: DC
  Administered 2023-11-22 – 2023-11-25 (×4): 10 mg via ORAL
  Filled 2023-11-21 (×3): qty 1
  Filled 2023-11-21: qty 2

## 2023-11-21 MED ORDER — HYDRALAZINE HCL 50 MG PO TABS
50.0000 mg | ORAL_TABLET | Freq: Three times a day (TID) | ORAL | Status: DC
  Administered 2023-11-22 – 2023-11-23 (×4): 50 mg via ORAL
  Filled 2023-11-21: qty 2
  Filled 2023-11-21: qty 1
  Filled 2023-11-21: qty 2
  Filled 2023-11-21: qty 1

## 2023-11-21 MED ORDER — ONDANSETRON HCL 4 MG/2ML IJ SOLN
4.0000 mg | Freq: Once | INTRAMUSCULAR | Status: AC
  Administered 2023-11-21: 4 mg via INTRAVENOUS
  Filled 2023-11-21: qty 2

## 2023-11-21 MED ORDER — ALLOPURINOL 100 MG PO TABS
50.0000 mg | ORAL_TABLET | ORAL | Status: DC
  Administered 2023-11-22 – 2023-11-24 (×2): 50 mg via ORAL
  Filled 2023-11-21 (×2): qty 1

## 2023-11-21 MED ORDER — CARVEDILOL 6.25 MG PO TABS
6.2500 mg | ORAL_TABLET | Freq: Two times a day (BID) | ORAL | Status: DC
  Administered 2023-11-22 – 2023-11-24 (×6): 6.25 mg via ORAL
  Filled 2023-11-21: qty 1
  Filled 2023-11-21: qty 2
  Filled 2023-11-21 (×2): qty 1
  Filled 2023-11-21: qty 2
  Filled 2023-11-21: qty 1

## 2023-11-21 MED ORDER — HYDRALAZINE HCL 25 MG PO TABS
50.0000 mg | ORAL_TABLET | Freq: Once | ORAL | Status: AC
  Administered 2023-11-21: 50 mg via ORAL
  Filled 2023-11-21: qty 2

## 2023-11-21 MED ORDER — ACETAMINOPHEN 650 MG RE SUPP: 650.0000 mg | Freq: Four times a day (QID) | RECTAL | Status: DC | PRN

## 2023-11-21 MED ORDER — ACETAMINOPHEN 325 MG PO TABS
650.0000 mg | ORAL_TABLET | Freq: Four times a day (QID) | ORAL | Status: DC | PRN
  Administered 2023-11-23: 650 mg via ORAL
  Filled 2023-11-21: qty 2

## 2023-11-21 MED ORDER — LABETALOL HCL 5 MG/ML IV SOLN
10.0000 mg | INTRAVENOUS | Status: DC | PRN
  Administered 2023-11-22 – 2023-11-25 (×4): 10 mg via INTRAVENOUS
  Filled 2023-11-21 (×4): qty 4

## 2023-11-21 MED ORDER — APIXABAN 5 MG PO TABS
5.0000 mg | ORAL_TABLET | Freq: Two times a day (BID) | ORAL | Status: DC
  Administered 2023-11-22 – 2023-11-23 (×4): 5 mg via ORAL
  Filled 2023-11-21: qty 1
  Filled 2023-11-21: qty 2
  Filled 2023-11-21 (×2): qty 1

## 2023-11-21 MED ORDER — ONDANSETRON HCL 4 MG/2ML IJ SOLN: 4.0000 mg | Freq: Four times a day (QID) | INTRAMUSCULAR | Status: DC | PRN

## 2023-11-21 NOTE — ED Triage Notes (Signed)
 Pt BIB [facility] from home d/t sudden onset HA, difficulty continuing to walk. Family reports they sat him in a chair outside where they were & pt began feeling sluggish & was slow to respond but still A/Ox4. EMS reports he was A/Ox4, slummped a little in stature, slow to respond, unknown if on thinners, was generally weak when transferring to their stretcher & his BP was 240/130. Did have one occurrence of emesis while en route to ED & pulse was approx. 100 bpm but ranged from 90-140 bpm intermittently. CBG 118, 18g Lt AC PIV.

## 2023-11-21 NOTE — H&P (Incomplete)
 History and Physical    Patrick [name] WUJ:[id number] DOB: 1946/03/12 DOA: 11/21/2023  PCP: Duncan Gibson, NP  Patient coming from: home  I have personally briefly reviewed patient's old medical records in [facility]  Chief Complaint:  weakness  HPI: Patrick [name] is a 78 y.o. 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 In the field EMS noted patient was A/0x 4. Patient blood pressure was noted to be 240/130, patient also in field had one episode of emesis.  ED Course:  Afeb, bp 228/98-218/126, HR95, rr 18, sat 100%  EKG: atritrial fib with cvr 98  LAD  Wbc: 4.6, hg 10.7,  Plt 153 INR 1.2 NA 143, K 3.3, bicarb 19, Glu117, cr 5.26 CTH IMPRESSION: 1. No acute intracranial process. 2. 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   Cxr: IMPRESSION: 1. No acute cardiopulmonary findings. 2. Stable cardiomegaly.  UA: Neg  [id number] TX Hydralazine  10 mg iv  Carvedilol , hydralazine ,labetalol ,zofran   Review of Systems: As per HPI otherwise 10 point review of systems negative.   Past Medical History:  Diagnosis Date   Atrial fibrillation (HCC)    CAD (coronary artery disease)    Chronic anticoagulation, on coumadin  07/17/2013   CKD (chronic kidney disease), stage II    CVA (cerebral infarction) 11/22/10   Thalamic with residual memory loss and slow speech   Depression    Diabetes mellitus type II    Insulin  dependent   GERD (gastroesophageal reflux disease)    Gout    Hyperlipidemia    Hypertension    Myocardial infarction (HCC) 2000's    "near heart attack" (07/14/2013)   Paroxysmal atrial fibrillation (HCC)    on coumadin    Permanent atrial fibrillation (HCC) 07/17/2013   Shortness of breath    "can happen at any time" (07/14/2013)   Sleep apnea 07/2010   "has mask at home; seldom uses it" (07/14/2013)   Stomach ulcer 1950's   "as a teenager"   Stroke (HCC) ~ 2007; ~ 2009   "memory not as good since" (07/14/2013)   Tachy-brady syndrome (HCC) 07/17/2013   Upper GI bleeding 07/01/2013    Past Surgical History:  Procedure Laterality Date   CARDIAC CATHETERIZATION  07/2003   [name] notes 08/27/2005 (07/01/2013)     reports that he quit smoking about 47 years ago. His smoking use included cigarettes. He started smoking about 62 years ago. He has a 7.5 pack-year smoking history. He has quit using smokeless tobacco. He reports that he does not drink alcohol  and does not use drugs.  No Known Allergies  Family History  Problem Relation Age of Onset   Diabetes Mother    Hypertension Mother    Heart Problems Father    Hypertension Sister    Diabetes Sister    Diabetes Sister    Diabetes Sister    Cancer Brother     Prior to Admission medications   Medication Sig Start Date End Date Taking? Authorizing Provider  allopurinol  (ZYLOPRIM ) 100 MG tablet Take 0.5 tablets (50 mg total) by mouth every other day. 12/29/20   Lillia Reilly, MD  amLODipine  (NORVASC ) 10 MG tablet TAKE ONE TABLET BY MOUTH ONE TIME DAILY 11/18/23   [name], [name], NP  calcitRIOL (ROCALTROL) 0.25 MCG capsule Take 0.25 mcg by mouth daily. 09/16/23   [provider]  carvedilol  (COREG ) 6.25 MG tablet Take 1 tablet (6.25 mg total) by mouth 2 (two) times daily with a meal. 12/20/20   Lillia Reilly, MD  ELIQUIS  5 MG TABS tablet Take 1 tablet (5 mg total) by mouth 2 (two) times daily. 11/19/23   [name], [name], NP  hydrALAZINE  (APRESOLINE ) 50 MG tablet TAKE ONE TABLET BY MOUTH THREE TIMES A DAY 09/24/23   Gerald Kitty., NP     Physical Exam: Vitals:   11/21/23 2045 11/21/23 2100 11/21/23 2116 11/21/23 2120  BP:  (!) 205/130  (!) 147/85  Pulse:  (!) 106  78  Resp:  (!) 25  (!) 25  Temp:   98.6 F (37 C)   TempSrc:   Oral   SpO2:  100%  100%  Weight: 79.6 kg     Height: 5\' 7"  (1.702 m)       Constitutional: NAD, calm, comfortable Vitals:   11/21/23 2045 11/21/23 2100 11/21/23 2116 11/21/23 2120  BP:  (!) 205/130  (!) 147/85  Pulse:  (!) 106  78  Resp:  (!) 25  (!) 25  Temp:   98.6 F (37 C)   TempSrc:   Oral   SpO2:  100%  100%  Weight: 79.6 kg     Height: 5\' 7"  (1.702 m)      Eyes: PERRL, lids and conjunctivae normal ENMT: Mucous membranes are moist. Posterior pharynx clear of any exudate or lesions.Normal dentition.  Neck: normal, supple, no masses, no thyromegaly Respiratory: clear to auscultation bilaterally, no wheezing, no crackles. Normal respiratory effort. No accessory muscle use.  Cardiovascular: Regular rate and rhythm, no murmurs / rubs / gallops. No extremity edema. 2+ pedal pulses. No carotid bruits.  Abdomen: no tenderness, no masses palpated. No hepatosplenomegaly. Bowel sounds positive.  Musculoskeletal: no clubbing / cyanosis. No joint deformity upper and lower extremities. Good ROM, no contractures. Normal muscle tone.  Skin: no rashes, lesions, ulcers. No induration Neurologic: CN 2-12 grossly intact. Sensation intact, DTR normal. Strength 5/5 in all 4.  Psychiatric: Normal judgment and insight. Alert and oriented x 3. Normal mood.    Labs on Admission: I have personally reviewed following labs and imaging studies  CBC: Recent Labs  Lab 11/21/23 1820  WBC 4.6  NEUTROABS 3.3  HGB 10.7*  HCT 34.7*  MCV 85.0  PLT 153   Basic Metabolic Panel: Recent Labs  Lab 11/21/23 1820  NA 143  K 3.3*  CL 109  CO2 19*  GLUCOSE 117*  BUN 89*  CREATININE 5.26*  CALCIUM  8.0*   GFR: Estimated Creatinine Clearance: 12.3 mL/min (A) (by C-G formula based on SCr of 5.26  mg/dL (H)). Liver Function Tests: No results for input(s): "AST", "ALT", "ALKPHOS", "BILITOT", "PROT", "ALBUMIN" in the last 168 hours. No results for input(s): "LIPASE", "AMYLASE" in the last 168 hours. No results for input(s): "AMMONIA" in the last 168 hours. Coagulation Profile: Recent Labs  Lab 11/21/23 1820  INR 1.2   Cardiac Enzymes: No results for input(s): "CKTOTAL", "CKMB", "CKMBINDEX", "TROPONINI" in the last 168 hours. BNP (last 3 results) No results for input(s): "PROBNP" in the last 8760 hours. HbA1C: No results for input(s): "HGBA1C" in the last 72 hours. CBG: No results for input(s): "GLUCAP" in the last  168 hours. Lipid Profile: No results for input(s): "CHOL", "HDL", "LDLCALC", "TRIG", "CHOLHDL", "LDLDIRECT" in the last 72 hours. Thyroid  Function Tests: No results for input(s): "TSH", "T4TOTAL", "FREET4", "T3FREE", "THYROIDAB" in the last 72 hours. Anemia Panel: No results for input(s): "VITAMINB12", "FOLATE", "FERRITIN", "TIBC", "IRON", "RETICCTPCT" in the last 72 hours. Urine analysis:    Component Value Date/Time   COLORURINE YELLOW 11/21/2023 2000   APPEARANCEUR CLEAR 11/21/2023 2000   LABSPEC 1.011 11/21/2023 2000   PHURINE 6.0 11/21/2023 2000   GLUCOSEU 50 (A) 11/21/2023 2000   HGBUR SMALL (A) 11/21/2023 2000   BILIRUBINUR NEGATIVE 11/21/2023 2000   BILIRUBINUR negative 08/29/2015 1516   KETONESUR 5 (A) 11/21/2023 2000   PROTEINUR >=300 (A) 11/21/2023 2000   UROBILINOGEN 0.2 08/29/2015 1516   UROBILINOGEN 0.2 10/12/2013 1032   NITRITE NEGATIVE 11/21/2023 2000   LEUKOCYTESUR NEGATIVE 11/21/2023 2000    Radiological Exams on Admission: DG Chest Port 1 View Result Date: 11/21/2023 CLINICAL DATA:  Weakness. EXAM: PORTABLE CHEST 1 VIEW COMPARISON:  Chest radiograph dated Dec 08, 2015. FINDINGS: Stable cardiomegaly. Aortic atherosclerosis. No focal consolidation, pleural effusion, or pneumothorax. No acute osseous abnormality. IMPRESSION: 1. No acute  cardiopulmonary findings. 2. Stable cardiomegaly. Electronically Signed   By: Mannie Seek M.D.   On: 11/21/2023 19:52   CT Head Wo Contrast Result Date: 11/21/2023 CLINICAL DATA:  Headache EXAM: CT HEAD WITHOUT CONTRAST TECHNIQUE: Contiguous axial images were obtained from the base of the skull through the vertex without intravenous contrast. RADIATION DOSE REDUCTION: This exam was performed according to the departmental dose-optimization program which includes automated exposure control, adjustment of the mA and/or kV according to patient size and/or use of iterative reconstruction technique. COMPARISON:  Head CT 01/30/2016. FINDINGS: Brain: No evidence of acute infarction, hemorrhage, hydrocephalus, or acute extra-axial collection or mass lesion/mass effect. Bifrontal low-density subdural fluid collections are unchanged from the prior study. Left Vascular: Atherosclerotic calcifications are present within the cavernous internal carotid arteries. Skull: Normal. Negative for fracture or focal lesion. Sinuses/Orbits: No acute finding. Other: None. IMPRESSION: 1. No acute intracranial process. 2. 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 Electronically Signed   By: Tyron Gallon M.D.   On: 11/21/2023 19:47    EKG: Independently reviewed.   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 -CTH negative  - MRI pending  - no focal complaints on presentation and neuro exam nonfocal in ED - however to be  complete will f/u with MRI   AKI on CKDIV - hold nephrotoxic medications  - insetting of uncontrolled HTN    Chronic anemia  -stable h/h    Permanent atrial fibrillation -in cvr -continue  on Eliquis    CHFref -no acute exacerbation -appears well compensated  -resume GDMT as able    Depression   Gout  -continue allopurinol     OSA - noncompliant with cpap -monitor sats at bedtime    GERD -ppi    DVT prophylaxis: on Eliquis  Code Status: full/ as discussed per patient  wishes in event of cardiac arrest  Family Communication:  Disposition Plan: patient  expected to be admitted greater than 2 midnights  Consults called: n/a Admission status: progressive  Sabas Cradle MD [facility]   If 7PM-7AM, please contact night-coverage [email or web address] Password [facility]  11/21/2023, 9:43 PM

## 2023-11-21 NOTE — ED Notes (Signed)
 Called and placed PT on monitor with CCMD.

## 2023-11-21 NOTE — ED Notes (Addendum)
 PT starting getting nauseated and vomited

## 2023-11-21 NOTE — ED Provider Notes (Signed)
 [place] [facility] Provider Note   CSN: [id number] Arrival date & time: 11/21/23  1708     History {Add pertinent medical, surgical, social history, OB history to HPI:1} Chief Complaint  Patient presents with   Hypertension   Sudden HA    Patrick [name] is a 78 y.o. male.  He has a history of stroke, CKD, A-fib on Eliquis , hypertension, diabetes.  He said he was walking back from getting some food when he acutely felt weak all over.  He did not fall.  EMS reports that family said he was sluggish and slow to respond but still awake.  He then said he started to get a mild headache which he rates as 2 out of 10.  Blood pressure was elevated.  Vomited x 1.  Currently he still endorses a mild headache.  Feeling less weak.  Denies any recent illness.  Uses a cane to ambulate.  No chest pain or shortness of breath no abdominal pain.  No blurry vision or double vision.  The history is provided by the patient and the EMS personnel.  Weakness Severity:  Severe Onset quality:  Sudden Timing:  Constant Progression:  Improving Chronicity:  New Associated symptoms: difficulty walking, headaches and vomiting   Associated symptoms: no abdominal pain, no chest pain, no dysuria, no falls, no fever, no shortness of breath and no vision change        Home Medications Prior to Admission medications   Medication Sig Start Date End Date Taking? Authorizing Provider  allopurinol  (ZYLOPRIM ) 100 MG tablet Take 0.5 tablets (50 mg total) by mouth every other day. 12/29/20   Lillia Reilly, MD  amLODipine  (NORVASC ) 10 MG tablet TAKE ONE TABLET BY MOUTH ONE TIME DAILY 11/18/23   [name], [name], NP  carvedilol  (COREG ) 6.25 MG tablet Take 1 tablet (6.25 mg total) by mouth 2 (two) times daily with a meal. 12/20/20   Lillia Reilly, MD  ELIQUIS  5 MG TABS tablet Take 1 tablet (5 mg total) by mouth 2 (two) times daily. 11/19/23   [name], [name], NP  hydrALAZINE   (APRESOLINE ) 50 MG tablet TAKE ONE TABLET BY MOUTH THREE TIMES A DAY 09/24/23   Gerald Kitty., NP      Allergies    Patient has no known allergies.    Review of Systems   Review of Systems  Constitutional:  Negative for fever.  Eyes:  Negative for visual disturbance.  Respiratory:  Negative for shortness of breath.   Cardiovascular:  Negative for chest pain.  Gastrointestinal:  Positive for vomiting. Negative for abdominal pain.  Genitourinary:  Negative for dysuria.  Musculoskeletal:  Positive for gait problem. Negative for falls.  Neurological:  Positive for weakness and headaches.    Physical Exam Updated Vital Signs BP (!) 218/126 (BP Location: Right Arm)   Pulse 95   Temp 98.2 F (36.8 C) (Oral)   Resp 18   SpO2 100%  Physical Exam Vitals and nursing note reviewed.  Constitutional:      General: He is not in acute distress.    Appearance: Normal appearance. He is well-developed.  HENT:     Head: Normocephalic and atraumatic.  Eyes:     Conjunctiva/sclera: Conjunctivae normal.  Cardiovascular:     Rate and Rhythm: Normal rate and regular rhythm.     Heart sounds: No murmur heard. Pulmonary:     Effort: Pulmonary effort is normal. No respiratory distress.  Breath sounds: Normal breath sounds.  Abdominal:     Palpations: Abdomen is soft.     Tenderness: There is no abdominal tenderness. There is no guarding or rebound.  Musculoskeletal:        General: No swelling.     Cervical back: Neck supple.  Skin:    General: Skin is warm and dry.     Capillary Refill: Capillary refill takes less than 2 seconds.  Neurological:     General: No focal deficit present.     Mental Status: He is alert.     Cranial Nerves: No cranial nerve deficit.     Sensory: No sensory deficit.     Motor: No weakness.     ED Results / Procedures / Treatments   Labs (all labs ordered are listed, but only abnormal results are displayed) Labs Reviewed - No data to  display  EKG EKG Interpretation Date/Time:  Thursday November 21 2023 17:12:24 EDT Ventricular Rate:  98 PR Interval:    QRS Duration:  122 QT Interval:  340 QTC Calculation: 434 R Axis:   -66  Text Interpretation: Atrial fibrillation Left axis deviation Minimal voltage criteria for LVH, may be normal variant ( Cornell product ) Septal infarct , age undetermined Abnormal ECG When compared with ECG of 18-Nov-2015 13:51, rate is slower now Confirmed by Racheal Buddle [phone]) on 11/21/2023 5:26:48 PM  Radiology No results found.  Procedures Procedures  {Document cardiac monitor, telemetry assessment procedure when appropriate:1}  Medications Ordered in ED Medications - No data to display  ED Course/ Medical Decision Making/ A&P   {   Click here for ABCD2, HEART and other calculatorsREFRESH Note before signing :1}                              Medical Decision Making Amount and/or Complexity of Data Reviewed Labs: ordered. Radiology: ordered.   This patient complains of ***; this involves an extensive number of treatment Options and is a complaint that carries with it a high risk of complications and morbidity. The differential includes ***  I ordered, reviewed and interpreted labs, which included *** I ordered medication *** and reviewed PMP when indicated. I ordered imaging studies which included *** and I independently    visualized and interpreted imaging which showed *** Additional history obtained from *** Previous records obtained and reviewed *** I consulted *** and discussed lab and imaging findings and discussed disposition.  Cardiac monitoring reviewed, *** Social determinants considered, *** Critical Interventions: ***  After the interventions stated above, I reevaluated the patient and found *** Admission and further testing considered, ***   {Document critical care time when appropriate:1} {Document review of labs and clinical decision tools ie heart  score, Chads2Vasc2 etc:1}  {Document your independent review of radiology images, and any outside records:1} {Document your discussion with family members, caretakers, and with consultants:1} {Document social determinants of health affecting pt's care:1} {Document your decision making why or why not admission, treatments were needed:1} Final Clinical Impression(s) / ED Diagnoses Final diagnoses:  None    Rx / DC Orders ED Discharge Orders     None

## 2023-11-22 ENCOUNTER — Other Ambulatory Visit (HOSPITAL_COMMUNITY)

## 2023-11-22 ENCOUNTER — Inpatient Hospital Stay (HOSPITAL_COMMUNITY)

## 2023-11-22 DIAGNOSIS — R7989 Other specified abnormal findings of blood chemistry: Secondary | ICD-10-CM | POA: Diagnosis not present

## 2023-11-22 DIAGNOSIS — I63441 Cerebral infarction due to embolism of right cerebellar artery: Secondary | ICD-10-CM | POA: Diagnosis not present

## 2023-11-22 DIAGNOSIS — I639 Cerebral infarction, unspecified: Secondary | ICD-10-CM | POA: Diagnosis not present

## 2023-11-22 DIAGNOSIS — N189 Chronic kidney disease, unspecified: Secondary | ICD-10-CM | POA: Diagnosis not present

## 2023-11-22 DIAGNOSIS — G459 Transient cerebral ischemic attack, unspecified: Secondary | ICD-10-CM | POA: Diagnosis not present

## 2023-11-22 DIAGNOSIS — I161 Hypertensive emergency: Secondary | ICD-10-CM

## 2023-11-22 DIAGNOSIS — I160 Hypertensive urgency: Secondary | ICD-10-CM

## 2023-11-22 LAB — COMPREHENSIVE METABOLIC PANEL WITH GFR
ALT: 11 U/L (ref 0–44)
AST: 18 U/L (ref 15–41)
Albumin: 3.2 g/dL — ABNORMAL LOW (ref 3.5–5.0)
Alkaline Phosphatase: 79 U/L (ref 38–126)
Anion gap: 17 — ABNORMAL HIGH (ref 5–15)
BUN: 94 mg/dL — ABNORMAL HIGH (ref 8–23)
CO2: 16 mmol/L — ABNORMAL LOW (ref 22–32)
Calcium: 8 mg/dL — ABNORMAL LOW (ref 8.9–10.3)
Chloride: 111 mmol/L (ref 98–111)
Creatinine, Ser: 5.1 mg/dL — ABNORMAL HIGH (ref 0.61–1.24)
GFR, Estimated: 11 mL/min — ABNORMAL LOW (ref >60)
Glucose, Bld: 122 mg/dL — ABNORMAL HIGH (ref 70–99)
Potassium: 3.4 mmol/L — ABNORMAL LOW (ref 3.5–5.1)
Sodium: 144 mmol/L (ref 135–145)
Total Bilirubin: 1 mg/dL (ref 0.0–1.2)
Total Protein: 6.8 g/dL (ref 6.5–8.1)

## 2023-11-22 LAB — LIPID PANEL
Cholesterol: 156 mg/dL (ref 0–200)
HDL: 58 mg/dL (ref >40)
LDL Cholesterol: 84 mg/dL (ref 0–99)
Total CHOL/HDL Ratio: 2.7 ratio
Triglycerides: 68 mg/dL (ref <150)
VLDL: 14 mg/dL (ref 0–40)

## 2023-11-22 LAB — CBC
HCT: 34.9 % — ABNORMAL LOW (ref 39.0–52.0)
Hemoglobin: 10.6 g/dL — ABNORMAL LOW (ref 13.0–17.0)
MCH: 26 pg (ref 26.0–34.0)
MCHC: 30.4 g/dL (ref 30.0–36.0)
MCV: 85.7 fL (ref 80.0–100.0)
Platelets: 167 10*3/uL (ref 150–400)
RBC: 4.07 MIL/uL — ABNORMAL LOW (ref 4.22–5.81)
RDW: 15.4 % (ref 11.5–15.5)
WBC: 4.3 10*3/uL (ref 4.0–10.5)
nRBC: 0 % (ref 0.0–0.2)

## 2023-11-22 LAB — ECHOCARDIOGRAM COMPLETE
Height: 67 in
P 1/2 time: 449 ms
S' Lateral: 3.1 cm
Weight: 2807.78 [oz_av]

## 2023-11-22 LAB — PHOSPHORUS: Phosphorus: 4.6 mg/dL (ref 2.5–4.6)

## 2023-11-22 LAB — TSH: TSH: 4.134 u[IU]/mL (ref 0.350–4.500)

## 2023-11-22 LAB — HEMOGLOBIN A1C
Hgb A1c MFr Bld: 5.4 % (ref 4.8–5.6)
Mean Plasma Glucose: 108.28 mg/dL

## 2023-11-22 LAB — MAGNESIUM: Magnesium: 1.7 mg/dL (ref 1.7–2.4)

## 2023-11-22 MED ORDER — STROKE: EARLY STAGES OF RECOVERY BOOK
Freq: Once | Status: AC
  Filled 2023-11-22: qty 1

## 2023-11-22 MED ORDER — ATORVASTATIN CALCIUM 10 MG PO TABS
20.0000 mg | ORAL_TABLET | Freq: Every day | ORAL | Status: DC
  Administered 2023-11-23 – 2023-11-25 (×3): 20 mg via ORAL
  Filled 2023-11-22 (×3): qty 2

## 2023-11-22 NOTE — Progress Notes (Signed)
  Echocardiogram 2D Echocardiogram has been performed.  Dione Franks 11/22/2023, 11:04 AM

## 2023-11-22 NOTE — Progress Notes (Addendum)
 SLP Cancellation Note  Patient Details Name: Patrick [name] MRN: [id number] DOB: 25-Jul-1946   Cancelled treatment:        Attempted to see pt for cognitive linguistic assessment.  Pt unavailable at time of attempt d/t bedside procedure.  SLP will reattempt as schedule permits.   Addendum: 2nd attempt.  Pt off floor for procedure.  RN reports no obvious cognitive-linguistic deficits.  MRI 4/24 with R cerebellar infarct.  SLP will continue to follow for formal assessment as schedule permits.   Elester Grim, MA, CCC-SLP Acute Rehabilitation Services Office: [phone] 11/22/2023, 9:37 AM

## 2023-11-22 NOTE — Progress Notes (Signed)
 PROGRESS NOTE    Patrick [name]  ZOX:[id number] DOB: 02-Apr-1946 DOA: 11/21/2023 PCP: Patrick Gibson, NP    Brief Narrative:  78 year old with history of stroke and vascular dementia, slow speech at baseline, CKD stage IV, chronic anemia, permanent A-fib on Eliquis , GERD and coronary artery disease, hypertension brought to the hospital with finding more weak, headache and difficulty walking and almost fell.  Patient is poor historian.  He tells us  that he was more weaker.  In the emergency room no focal deficits.  Blood pressure 228/98.  On room air.  EKG with A-fib.  CT head with chronic subdural hygroma.  Chest x-ray normal.  Admitted with hypertensive emergency.  Subsequent MRI showed right cerebellar stroke.  Subjective: Patient seen and examined.  Remains in the emergency room.  Poor historian.  Denies any complaints.  Rate controlled A-fib on telemetry monitor.  Patient denies any focal weakness. Called and discussed with patient's son Patrick [name]. Neuroconsult called.  Assessment & Plan:   Acute right cerebellar stroke: In a patient with previous stroke with residual memory loss and slow speech. Admit to monitored unit because of severity of symptoms. Neurochecks and vital signs as per stroke protocol. Patient was not a TPA and vascular intervention candidate because of minimal neurological deficits. Diet, eating regular diet.  Speech therapy to see. Antiplatelets, already on Eliquis  at home for A-fib.  Continue. Statin, not on statins at home.  Checking fasting lipid profile. Blood pressure goals, 180 and above.  As needed medications.  Resume home medications. Consultations, neurology, speech, PT OT MRI of the brain with right cerebellar stroke. Cannot have contrast due to kidney functions, will check carotid ultrasound. 2D echocardiogram, pending. A1c recently was normal. Fasting lipid profile to be checked. Fall precautions.  Hypertensive urgency: As above.   Gradually decreasing blood pressure.  Goal more than 180 today.  AKI on CKD stage V: Creatinine at about baseline.  5.1.  No evidence of uremia.  He is followed by nephrology as outpatient.  Will close monitor renal functions.  Permanent A-fib: Rate controlled.  Already therapeutic on Eliquis .  Continue.  GERD: On PPI.    DVT prophylaxis:  apixaban  (ELIQUIS ) tablet 5 mg   Code Status: Full code Family Communication: Son called and updated Disposition Plan: Status is: Inpatient Remains inpatient appropriate because: New onset stroke     Consultants:  Neurology  Procedures:  None  Antimicrobials:  None     Objective: Vitals:   11/22/23 0720 11/22/23 0900 11/22/23 1115 11/22/23 1116  BP:  (!) 187/92 (!) 170/96   Pulse:   66   Resp:  15 17   Temp: 98.4 F (36.9 C)   98.3 F (36.8 C)  TempSrc:    Oral  SpO2:   100%   Weight:      Height:       No intake or output data in the 24 hours ending 11/22/23 1133 Filed Weights   11/21/23 2045  Weight: 79.6 kg    Examination:  General exam: Appears calm and comfortable  Alert awake.  He is oriented to himself and family.  Not oriented to time and place.  Pleasant interaction. Speech is clear.  Does not have any focal deficits.  Gait not examined. Respiratory system: Clear to auscultation. Respiratory effort normal. Cardiovascular system: S1 & S2 heard, irregularly irregular. Gastrointestinal system: Soft and nontender.  Bowel sound present.    Data Reviewed: I have personally reviewed following labs and imaging studies  CBC: Recent  Labs  Lab 11/21/23 1820 11/22/23 0128  WBC 4.6 4.3  NEUTROABS 3.3  --   HGB 10.7* 10.6*  HCT 34.7* 34.9*  MCV 85.0 85.7  PLT 153 167   Basic Metabolic Panel: Recent Labs  Lab 11/21/23 1820 11/22/23 0128  NA 143 144  K 3.3* 3.4*  CL 109 111  CO2 19* 16*  GLUCOSE 117* 122*  BUN 89* 94*  CREATININE 5.26* 5.10*  CALCIUM  8.0* 8.0*  MG  --  1.7  PHOS  --  4.6    GFR: Estimated Creatinine Clearance: 12.7 mL/min (A) (by C-G formula based on SCr of 5.1 mg/dL (H)). Liver Function Tests: Recent Labs  Lab 11/22/23 0128  AST 18  ALT 11  ALKPHOS 79  BILITOT 1.0  PROT 6.8  ALBUMIN 3.2*   No results for input(s): "LIPASE", "AMYLASE" in the last 168 hours. No results for input(s): "AMMONIA" in the last 168 hours. Coagulation Profile: Recent Labs  Lab 11/21/23 1820  INR 1.2   Cardiac Enzymes: No results for input(s): "CKTOTAL", "CKMB", "CKMBINDEX", "TROPONINI" in the last 168 hours. BNP (last 3 results) No results for input(s): "PROBNP" in the last 8760 hours. HbA1C: Recent Labs    11/22/23 0128  HGBA1C 5.4   CBG: No results for input(s): "GLUCAP" in the last 168 hours. Lipid Profile: Recent Labs    11/22/23 0128  CHOL 156  HDL 58  LDLCALC 84  TRIG 68  CHOLHDL 2.7   Thyroid  Function Tests: Recent Labs    11/22/23 0128  TSH 4.134   Anemia Panel: No results for input(s): "VITAMINB12", "FOLATE", "FERRITIN", "TIBC", "IRON", "RETICCTPCT" in the last 72 hours. Sepsis Labs: No results for input(s): "PROCALCITON", "LATICACIDVEN" in the last 168 hours.  No results found for this or any previous visit (from the past 240 hours).       Radiology Studies: ECHOCARDIOGRAM COMPLETE Result Date: 11/22/2023    ECHOCARDIOGRAM REPORT   Patient Name:   Patrick [name] Date of Exam: 11/22/2023 Medical Rec #:  [id number]    Height:       67.0 in Accession #:    [id number]   Weight:       175.5 lb Date of Birth:  10-24-1945     BSA:          1.913 m Patient Age:    77 years     BP:           187/92 mmHg Patient Gender: M            HR:           69 bpm. Exam Location:  Inpatient Procedure: 2D Echo (Both Spectral and Color Flow Doppler were utilized during            procedure). Indications:    elevated troponin  History:        Patient has prior history of Echocardiogram examinations, most                 recent 08/03/2022. Chronic kidney disease,  Arrythmias:Atrial                 Fibrillation and PVC; Risk Factors:Hypertension, Dyslipidemia,                 Diabetes and Sleep Apnea.  Sonographer:    Dione Franks RDCS Referring Phys: [id number] SARA-MAIZ A THOMAS IMPRESSIONS  1. Left ventricular ejection fraction, by estimation, is 55 to 60%. The left ventricle has normal function. The left ventricle  has no regional wall motion abnormalities. There is severe concentric left ventricular hypertrophy. Left ventricular diastolic  parameters are indeterminate.  2. Right ventricular systolic function is normal. The right ventricular size is normal. There is moderately elevated pulmonary artery systolic pressure. The estimated right ventricular systolic pressure is 59.8 mmHg.  3. Left atrial size was severely dilated.  4. Right atrial size was moderately dilated.  5. A small pericardial effusion is present. The pericardial effusion is posterior and lateral to the left ventricle. There is no evidence of cardiac tamponade.  6. The mitral valve is degenerative. Mild mitral valve regurgitation. No evidence of mitral stenosis. Moderate mitral annular calcification.  7. The aortic valve is tricuspid. There is mild calcification of the aortic valve. Aortic valve regurgitation is mild. No aortic stenosis is present.  8. Aortic dilatation noted. There is borderline dilatation of the aortic root, measuring 38 mm.  9. The inferior vena cava is normal in size with <50% respiratory variability, suggesting right atrial pressure of 8 mmHg. FINDINGS  Left Ventricle: Left ventricular ejection fraction, by estimation, is 55 to 60%. The left ventricle has normal function. The left ventricle has no regional wall motion abnormalities. The left ventricular internal cavity size was normal in size. There is  severe concentric left ventricular hypertrophy. Left ventricular diastolic parameters are indeterminate. Right Ventricle: The right ventricular size is normal. No increase in right  ventricular wall thickness. Right ventricular systolic function is normal. There is moderately elevated pulmonary artery systolic pressure. The tricuspid regurgitant velocity is 3.70 m/s, and with an assumed right atrial pressure of 5 mmHg, the estimated right ventricular systolic pressure is 59.8 mmHg. Left Atrium: Left atrial size was severely dilated. Right Atrium: Right atrial size was moderately dilated. Pericardium: A small pericardial effusion is present. The pericardial effusion is posterior and lateral to the left ventricle. There is no evidence of cardiac tamponade. Mitral Valve: The mitral valve is degenerative in appearance. There is mild calcification of the mitral valve leaflet(s). Moderate mitral annular calcification. Mild mitral valve regurgitation. No evidence of mitral valve stenosis. Tricuspid Valve: The tricuspid valve is normal in structure. Tricuspid valve regurgitation is trivial. No evidence of tricuspid stenosis. Aortic Valve: The aortic valve is tricuspid. There is mild calcification of the aortic valve. Aortic valve regurgitation is mild. Aortic regurgitation PHT measures 449 msec. No aortic stenosis is present. Pulmonic Valve: The pulmonic valve was normal in structure. Pulmonic valve regurgitation is trivial. No evidence of pulmonic stenosis. Aorta: Aortic dilatation noted. There is borderline dilatation of the aortic root, measuring 38 mm. Venous: The inferior vena cava is normal in size with less than 50% respiratory variability, suggesting right atrial pressure of 8 mmHg. IAS/Shunts: No atrial level shunt detected by color flow Doppler.  LEFT VENTRICLE PLAX 2D LVIDd:         4.60 cm   Diastology LVIDs:         3.10 cm   LV e' lateral: 6.42 cm/s LV PW:         1.60 cm LV IVS:        1.50 cm LVOT diam:     1.90 cm LV SV:         43 LV SV Index:   22 LVOT Area:     2.84 cm  RIGHT VENTRICLE            IVC RV S prime:     7.94 cm/s  IVC diam: 1.50 cm TAPSE (M-mode): 0.8 cm LEFT  ATRIUM              Index        RIGHT ATRIUM           Index LA diam:        4.10 cm 2.14 cm/m   RA Area:     21.00 cm LA Vol (A2C):   80.0 ml 41.82 ml/m  RA Volume:   63.20 ml  33.04 ml/m LA Vol (A4C):   95.1 ml 49.71 ml/m LA Biplane Vol: 92.1 ml 48.15 ml/m  AORTIC VALVE LVOT Vmax:   77.90 cm/s LVOT Vmean:  49.900 cm/s LVOT VTI:    0.150 m AI PHT:      449 msec  AORTA Ao Root diam: 3.80 cm Ao Asc diam:  3.30 cm TRICUSPID VALVE TR Peak grad:   54.8 mmHg TR Vmax:        370.00 cm/s  SHUNTS Systemic VTI:  0.15 m Systemic Diam: 1.90 cm Jules Oar MD Electronically signed by Jules Oar MD Signature Date/Time: 11/22/2023/11:16:48 AM    Final    MR BRAIN WO CONTRAST Result Date: 11/22/2023 CLINICAL DATA:  Headache EXAM: MRI HEAD WITHOUT CONTRAST TECHNIQUE: Multiplanar, multiecho pulse sequences of the brain and surrounding structures were obtained without intravenous contrast. COMPARISON:  11/21/2015 FINDINGS: Brain: Small focus of acute ischemia in the right cerebellum. Hemosiderin deposition in the right cerebellum at the site of old infarct. Multiple chronic microhemorrhages in a predominantly peripheral distribution. Bihemispheric subarachnoid siderosis. There is multifocal hyperintense T2-weighted signal within the white matter. Generalized volume loss. Old right frontal and occipital infarcts. The midline structures are normal. Vascular: Normal flow voids. Skull and upper cervical spine: Normal calvarium and skull base. Visualized upper cervical spine and soft tissues are normal. Sinuses/Orbits:No paranasal sinus fluid levels or advanced mucosal thickening. No mastoid or middle ear effusion. Normal orbits. IMPRESSION: 1. Small focus of acute ischemia in the right cerebellum. No hemorrhage or mass effect. 2. Multiple chronic microhemorrhages in a predominantly peripheral distribution, which may indicate cerebral amyloid angiopathy. 3. Old right frontal and occipital infarcts. Electronically Signed   By:  Juanetta Nordmann M.D.   On: 11/22/2023 00:34   DG Chest Port 1 View Result Date: 11/21/2023 CLINICAL DATA:  Weakness. EXAM: PORTABLE CHEST 1 VIEW COMPARISON:  Chest radiograph dated Dec 08, 2015. FINDINGS: Stable cardiomegaly. Aortic atherosclerosis. No focal consolidation, pleural effusion, or pneumothorax. No acute osseous abnormality. IMPRESSION: 1. No acute cardiopulmonary findings. 2. Stable cardiomegaly. Electronically Signed   By: Mannie Seek M.D.   On: 11/21/2023 19:52   CT Head Wo Contrast Result Date: 11/21/2023 CLINICAL DATA:  Headache EXAM: CT HEAD WITHOUT CONTRAST TECHNIQUE: Contiguous axial images were obtained from the base of the skull through the vertex without intravenous contrast. RADIATION DOSE REDUCTION: This exam was performed according to the departmental dose-optimization program which includes automated exposure control, adjustment of the mA and/or kV according to patient size and/or use of iterative reconstruction technique. COMPARISON:  Head CT 01/30/2016. FINDINGS: Brain: No evidence of acute infarction, hemorrhage, hydrocephalus, or acute extra-axial collection or mass lesion/mass effect. Bifrontal low-density subdural fluid collections are unchanged from the prior study. Left Vascular: Atherosclerotic calcifications are present within the cavernous internal carotid arteries. Skull: Normal. Negative for fracture or focal lesion. Sinuses/Orbits: No acute finding. Other: None. IMPRESSION: 1. No acute intracranial process. 2. 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 Electronically Signed   By: Rollen Clines.D.  On: 11/21/2023 19:47        Scheduled Meds:  [START ON 11/23/2023]  stroke: early stages of recovery book   Does not apply Once   allopurinol   50 mg Oral QODAY   amLODipine   10 mg Oral Daily   apixaban   5 mg Oral BID   carvedilol   6.25 mg Oral BID WC   hydrALAZINE   50 mg Oral TID    Continuous Infusions:   LOS: 1 day       Porcha Deblanc, MD [facility]

## 2023-11-22 NOTE — Consult Note (Signed)
 NEUROLOGY CONSULT NOTE   Date of service: November 22, 2023 Patient Name: Patrick [name] MRN:  [id number] DOB:  1946-07-27 Chief Complaint: "I can't walk" Requesting Provider: Vada Garibaldi, MD  History of Present Illness  Patrick [name] is a 78 y.o. male with hx of remote CVA, vascular dementia, slowed speech, CKD IV, chronic anemia, permanent AF on Eliquis , GERD, and CAD who presented to the ED for evaluation of gait disturbance.  Patient states that he went to eat at a restaurant yesterday and walked in without complication but when he got up to leave, he was unable to walk in a smooth pursuit and felt like he couldn't hardly walk.   Pt's baseline is that he walks with a cane, mostly controls his own ADLs with the exception of finances.  Pt feels somewhat better today on exam.  Pt's wife and one of his sons were present in the exam room and assisted with the history.  Spoke to patient's son Sasan Wilkie on the phone (who lives with Pt and pt's wife) who manages their affairs. He reports that his dad often misses his medications, and he thinks that it is likely his father has missed multiple doses of eliquis  over the last few weeks. He reports that his dad is quiet and slow-moving at baseline, and that at home he walks with a cane.   ROS  Comprehensive ROS performed and pertinent positives documented in HPI    Past History   Past Medical History:  Diagnosis Date   Atrial fibrillation (HCC)    CAD (coronary artery disease)    Chronic anticoagulation, on coumadin  07/17/2013   CKD (chronic kidney disease), stage II    CVA (cerebral infarction) 11/22/10   Thalamic with residual memory loss and slow speech   Depression    Diabetes mellitus type II    Insulin  dependent   GERD (gastroesophageal reflux disease)    Gout    Hyperlipidemia    Hypertension    Myocardial infarction (HCC) 2000's   "near heart attack" (07/14/2013)   Paroxysmal atrial fibrillation (HCC)    on coumadin     Permanent atrial fibrillation (HCC) 07/17/2013   Shortness of breath    "can happen at any time" (07/14/2013)   Sleep apnea 07/2010   "has mask at home; seldom uses it" (07/14/2013)   Stomach ulcer 1950's   "as a teenager"   Stroke (HCC) ~ 2007; ~ 2009   "memory not as good since" (07/14/2013)   Tachy-brady syndrome (HCC) 07/17/2013   Upper GI bleeding 07/01/2013    Past Surgical History:  Procedure Laterality Date   CARDIAC CATHETERIZATION  07/2003   [name] notes 08/27/2005 (07/01/2013)    Family History: Family History  Problem Relation Age of Onset   Diabetes Mother    Hypertension Mother    Heart Problems Father    Hypertension Sister    Diabetes Sister    Diabetes Sister    Diabetes Sister    Cancer Brother     Social History  reports that he quit smoking about 47 years ago. His smoking use included cigarettes. He started smoking about 62 years ago. He has a 7.5 pack-year smoking history. He has quit using smokeless tobacco. He reports that he does not drink alcohol  and does not use drugs.  No Known Allergies  Medications   Current Facility-Administered Medications:    [START ON 11/23/2023]  stroke: early stages of recovery book, , Does not apply, Once, Vada Garibaldi, MD   acetaminophen  (TYLENOL )  tablet 650 mg, 650 mg, Oral, Q6H PRN **OR** acetaminophen  (TYLENOL ) suppository 650 mg, 650 mg, Rectal, Q6H PRN, Sabas Cradle, MD   allopurinol  (ZYLOPRIM ) tablet 50 mg, 50 mg, Oral, QODAY, Thomas, Sara-Maiz A, MD   amLODipine  (NORVASC ) tablet 10 mg, 10 mg, Oral, Daily, Tera Fellows A, MD, 10 mg at 11/22/23 4098   apixaban  (ELIQUIS ) tablet 5 mg, 5 mg, Oral, BID, Tera Fellows A, MD, 5 mg at 11/22/23 1191   carvedilol  (COREG ) tablet 6.25 mg, 6.25 mg, Oral, BID WC, Tera Fellows A, MD, 6.25 mg at 11/22/23 4782   hydrALAZINE  (APRESOLINE ) tablet 50 mg, 50 mg, Oral, TID, Tera Fellows A, MD, 50 mg at 11/22/23 9562   labetalol  (NORMODYNE ) injection 10 mg,  10 mg, Intravenous, Q2H PRN, Sabas Cradle, MD, 10 mg at 11/22/23 1308   levalbuterol  (XOPENEX ) nebulizer solution 0.63 mg, 0.63 mg, Nebulization, Q6H PRN, Sabas Cradle, MD   ondansetron  (ZOFRAN ) tablet 4 mg, 4 mg, Oral, Q6H PRN **OR** ondansetron  (ZOFRAN ) injection 4 mg, 4 mg, Intravenous, Q6H PRN, Sabas Cradle, MD  Current Outpatient Medications:    allopurinol  (ZYLOPRIM ) 100 MG tablet, Take 0.5 tablets (50 mg total) by mouth every other day., Disp: 30 tablet, Rfl: 1   amLODipine  (NORVASC ) 10 MG tablet, TAKE ONE TABLET BY MOUTH ONE TIME DAILY, Disp: 90 tablet, Rfl: 0   calcitRIOL (ROCALTROL) 0.25 MCG capsule, Take 0.25 mcg by mouth daily., Disp: , Rfl:    carvedilol  (COREG ) 6.25 MG tablet, Take 1 tablet (6.25 mg total) by mouth 2 (two) times daily with a meal., Disp: 90 tablet, Rfl: 1   ELIQUIS  5 MG TABS tablet, Take 1 tablet (5 mg total) by mouth 2 (two) times daily., Disp: 60 tablet, Rfl: 0   hydrALAZINE  (APRESOLINE ) 50 MG tablet, TAKE ONE TABLET BY MOUTH THREE TIMES A DAY, Disp: 90 tablet, Rfl: 0  Vitals   Vitals:   11/22/23 1115 11/22/23 1116 11/22/23 1200 11/22/23 1245  BP: (!) 170/96  (!) 176/81 (!) 162/79  Pulse: 66  76 70  Resp: 17  20 17   Temp:  98.3 F (36.8 C)    TempSrc:  Oral    SpO2: 100%  97% 98%  Weight:      Height:        Body mass index is 27.49 kg/m.  Physical Exam   Constitutional: Appears stated age, well-developed and well-nourished. NAD Psych: Pt calm, cooperative.  Eyes: No scleral injection.  HENT: No OP obstruction.  Head: Normocephalic. No fall, no trauma Cardiovascular: Atrial fibrillation with a possible repolarization anomaly as my read on EKG in room. Respiratory: Effort normal, non-labored breathing.  GI: Soft.  No distension. There is no tenderness.  Skin: WDI.   Neurologic Examination   Mental Status: Patient is awake, alert, oriented to person, place, month, year, and situation. Patient is able to give a limited  history. No signs of aphasia or neglect. Cranial Nerves: II: Visual Fields are full. Pupils are equal, round, and reactive to light.   III,IV, VI: EOMI without ptosis or diploplia.  V: Facial sensation is intact and symmetric to light touch VII: Face is symmetric resting and with movement VIII: Hearing is intact to loud voice, symmetric X: Palate elevates symmetrically XI: Shoulder shrug is symmetric. XII: Tongue protrudes midline with minimal fasciculations.  Motor: Tone is normal. Bulk is normal. 5/5 strength was present in all four extremities. Some decreased rapid alternating movements of the bilateral hands.  Sensory: Sensation is symmetric to  light touch and temperature in the arms and legs. No extinction to DSS present.  Deep Tendon Reflexes: 2+ and symmetric in the biceps and patellae.  Plantars: Toes are downgoing bilaterally.  Cerebellar: FNF and HKS are intact bilaterally No drift in upper or lower extremities.  NIHSS components Score: Comment  1a Level of Conscious 0[x]  1[]  2[]  3[]      1b LOC Questions 0[x]  1[]  2[]       1c LOC Commands 0[x]  1[]  2[]       2 Best Gaze 0[x]  1[]  2[]       3 Visual 0[x]  1[]  2[]  3[]      4 Facial Palsy 0[x]  1[]  2[]  3[]      5a Motor Arm - left 0[x]  1[]  2[]  3[]  4[]  UN[]    5b Motor Arm - Right 0[x]  1[]  2[]  3[]  4[]  UN[]    6a Motor Leg - Left 0[x]  1[]  2[]  3[]  4[]  UN[]    6b Motor Leg - Right 0[x]  1[]  2[]  3[]  4[]  UN[]    7 Limb Ataxia 0[x]  1[]  2[]  3[]  UN[]     8 Sensory 0[x]  1[]  2[]  UN[]      9 Best Language 0[x]  1[]  2[]  3[]      10 Dysarthria 0[]  1[]  2[]  UN[]      11 Extinct. and Inattention 0[]  1[]  2[]       TOTAL:        Labs/Imaging/Neurodiagnostic studies   CBC:  Recent Labs  Lab 05-Dec-2023 1820 11/22/23 0128  WBC 4.6 4.3  NEUTROABS 3.3  --   HGB 10.7* 10.6*  HCT 34.7* 34.9*  MCV 85.0 85.7  PLT 153 167   Basic Metabolic Panel:  Lab Results  Component Value Date   NA 144 11/22/2023   K 3.4 (L) 11/22/2023   CO2 16 (L) 11/22/2023    GLUCOSE 122 (H) 11/22/2023   BUN 94 (H) 11/22/2023   CREATININE 5.10 (H) 11/22/2023   CALCIUM  8.0 (L) 11/22/2023   GFRNONAA 11 (L) 11/22/2023   GFRAA 32 (L) 12/31/2019   Lipid Panel:  Lab Results  Component Value Date   LDLCALC 84 11/22/2023   HgbA1c:  Lab Results  Component Value Date   HGBA1C 5.4 11/22/2023   Urine Drug Screen:     Component Value Date/Time   LABOPIA NONE DETECTED 11/22/2010 0831   COCAINSCRNUR NONE DETECTED 11/22/2010 0831   LABBENZ NONE DETECTED 11/22/2010 0831   AMPHETMU NONE DETECTED 11/22/2010 0831   THCU NONE DETECTED 11/22/2010 0831   LABBARB  11/22/2010 0831    NONE DETECTED        DRUG SCREEN FOR MEDICAL PURPOSES ONLY.  IF CONFIRMATION IS NEEDED FOR ANY PURPOSE, NOTIFY LAB WITHIN 5 DAYS.        LOWEST DETECTABLE LIMITS FOR URINE DRUG SCREEN Drug Class       Cutoff (ng/mL) Amphetamine      1000 Barbiturate      200 Benzodiazepine   200 Tricyclics       300 Opiates          300 Cocaine          300 THC              50    Alcohol  Level     Component Value Date/Time   [facility]  11/22/2010 0857    <5        LOWEST DETECTABLE LIMIT FOR SERUM ALCOHOL  IS 5 mg/dL FOR MEDICAL PURPOSES ONLY   INR  Lab Results  Component Value Date   INR 1.2 Dec 05, 2023  APTT  Lab Results  Component Value Date   APTT 35 11/18/2015   AED levels: No results found for: "PHENYTOIN", "ZONISAMIDE", "LAMOTRIGINE", "LEVETIRACETA"  MRI Brain(Personally reviewed): 1. Small focus of acute ischemia in the right cerebellum. No hemorrhage or mass effect. 2. Multiple chronic microhemorrhages in a predominantly peripheral distribution, which may indicate cerebral amyloid angiopathy. 3. Old right frontal and occipital infarcts.  ASSESSMENT   Patrick [name] is a 78 y.o. male with PMHX of remote CVA with minimal residual deficits, vascular dementia, slowed speech, CKD IV, chronic anemia, permanent AF on Eliquis , GERD, and CAD who presented to the ED for evaluation of  gait disturbance.  Patient's MRI was consistent with a small focal ischemic stroke in the right cerebellum. Patient is outside the window for either TNK or thrombectomy.  Per the history obtained from patient's son, he does miss his medications fairly often (more than 2-3 days per week) simply due to executive function. Son thinks that it is very likely there are some days the patient does not take either dose of his eliquis . This acute cerebellar ischemic stroke is likely due to medication non-adherence in the setting of chronic atrial fibrillation.  The patient's physical exam today was reassuring, he did not appear to be suffering lingering effects of the stroke. There was no ataxia seen on physical exam, and the patient's coordination was in general close to his apparent baseline.   RECOMMENDATIONS  - Continue eliquis  5 mg BID - Allow permissive HTN for another 24 hours before returning patient to his normal target range. - Lipid panel shows LDL slightly above target of 70, so will therefore start atorvastatin  at 20mg  daily only - Frequent Neuro checks per stroke unit protocol - no need for TTE given known hx of afibb. - HbA1c within goal. - Recommend Telemetry monitoring for arrythmia - Recommend bedside swallow screen prior to PO intake. - Stroke education booklet - Recommend PT/OT/SLP consult  ______________________________________________________________________  Signed: Addie Addison, MD [facility] Physician, PGY-1 11/22/2023 3:34 PM  NEUROHOSPITALIST ADDENDUM Performed a face to face diagnostic evaluation.   I have reviewed the contents of history and physical exam as documented by PA/ARNP/Resident and agree with above documentation.  I have discussed and formulated the above plan as documented. Edits to the note have been made as needed.  Amit Leece, MD [facility] [phone]   If 7pm to 7am, please call on call as listed on AMION.

## 2023-11-23 DIAGNOSIS — I160 Hypertensive urgency: Secondary | ICD-10-CM | POA: Diagnosis not present

## 2023-11-23 DIAGNOSIS — I10 Essential (primary) hypertension: Secondary | ICD-10-CM

## 2023-11-23 DIAGNOSIS — I63441 Cerebral infarction due to embolism of right cerebellar artery: Secondary | ICD-10-CM | POA: Diagnosis not present

## 2023-11-23 DIAGNOSIS — E785 Hyperlipidemia, unspecified: Secondary | ICD-10-CM

## 2023-11-23 LAB — BASIC METABOLIC PANEL WITH GFR
Anion gap: 12 (ref 5–15)
BUN: 93 mg/dL — ABNORMAL HIGH (ref 8–23)
CO2: 20 mmol/L — ABNORMAL LOW (ref 22–32)
Calcium: 7.8 mg/dL — ABNORMAL LOW (ref 8.9–10.3)
Chloride: 110 mmol/L (ref 98–111)
Creatinine, Ser: 5.38 mg/dL — ABNORMAL HIGH (ref 0.61–1.24)
GFR, Estimated: 10 mL/min — ABNORMAL LOW (ref >60)
Glucose, Bld: 104 mg/dL — ABNORMAL HIGH (ref 70–99)
Potassium: 3.4 mmol/L — ABNORMAL LOW (ref 3.5–5.1)
Sodium: 142 mmol/L (ref 135–145)

## 2023-11-23 LAB — MAGNESIUM: Magnesium: 1.9 mg/dL (ref 1.7–2.4)

## 2023-11-23 MED ORDER — HYDRALAZINE HCL 50 MG PO TABS
100.0000 mg | ORAL_TABLET | Freq: Three times a day (TID) | ORAL | Status: DC
Start: 2023-11-23 — End: 2023-11-25
  Administered 2023-11-23 – 2023-11-25 (×5): 100 mg via ORAL
  Filled 2023-11-23 (×5): qty 2

## 2023-11-23 MED ORDER — APIXABAN 2.5 MG PO TABS
2.5000 mg | ORAL_TABLET | Freq: Two times a day (BID) | ORAL | Status: DC
  Administered 2023-11-23 – 2023-11-25 (×4): 2.5 mg via ORAL
  Filled 2023-11-23 (×4): qty 1

## 2023-11-23 NOTE — Plan of Care (Signed)

## 2023-11-23 NOTE — Evaluation (Signed)
 Occupational Therapy Evaluation Patient Details Name: Patrick [name] MRN: [id number] DOB: 22-Sep-1945 Today's Date: 11/23/2023   History of Present Illness   Patient is a 78 y.o.  male admitted 4/24 with headache, difficulty walking, presyncope-upon further evaluation-was found to have hypertensive urgency and acute right cerebellar stroke.  PMHx: A-fib on Eliquis , CAD, stage IV CKD, prior CVA, vascular dementia.     Clinical Impressions Unsure is pt. Is an accurate historian. Pt. States he was amb without device in the home and was able to perform his ADLs without assist. Pt. Required Min a with sit to stand and was able to take a few steps cga with rw. Pt. Was limited by L foot pain and his nurse was notified. Pt. Was able to follow directions and will be followed by OT. Pt. States he has assist at home 24/7. Pt. States his wife and son are able to assist. Pt. To be followed by acute OT to maximize I and function.      If plan is discharge home, recommend the following:   A little help with walking and/or transfers;A little help with bathing/dressing/bathroom;Assistance with cooking/housework     Functional Status Assessment   Patient has had a recent decline in their functional status and demonstrates the ability to make significant improvements in function in a reasonable and predictable amount of time.     Equipment Recommendations    (unsure if pt. is an accurate historian. Pt. may need DME.)     Recommendations for Other Services         Precautions/Restrictions   Precautions Precautions: Fall Restrictions Weight Bearing Restrictions Per Provider Order: No     Mobility Bed Mobility                    Transfers Overall transfer level: Needs assistance Equipment used: Rolling walker (2 wheels) Transfers: Sit to/from Stand Sit to Stand: Min assist           General transfer comment: Pt. able to take a few steps CGA with rw. Pt. limited by foot  pain.      Balance                                           ADL either performed or assessed with clinical judgement   ADL Overall ADL's : Needs assistance/impaired Eating/Feeding: Independent   Grooming: Wash/dry hands;Wash/dry face;Sitting;Set up   Upper Body Bathing: Supervision/ safety;Set up;Sitting   Lower Body Bathing: Minimal assistance;Sit to/from stand   Upper Body Dressing : Minimal assistance;Sitting   Lower Body Dressing: Moderate assistance;Sit to/from stand   Toilet Transfer: Minimal assistance   Toileting- Clothing Manipulation and Hygiene: Moderate assistance;Sit to/from stand       Functional mobility during ADLs: Minimal assistance;Rolling walker (2 wheels) General ADL Comments: Pt. was sitting in recliner chair during ADLs.     Vision Baseline Vision/History: 1 Wears glasses Ability to See in Adequate Light: 0 Adequate Patient Visual Report: No change from baseline       Perception         Praxis         Pertinent Vitals/Pain Pain Assessment Pain Assessment: 0-10 Pain Score: 6  Pain Location: L top of foot Pain Descriptors / Indicators: Aching Pain Intervention(s): Patient requesting pain meds-RN notified     Extremity/Trunk Assessment Upper Extremity Assessment Upper Extremity Assessment: Generalized weakness  Communication     Cognition Arousal: Alert   Cognition: History of cognitive impairments, No family/caregiver present to determine baseline                               Following commands: Impaired Following commands impaired: Only follows one step commands consistently     Cueing  General Comments   Cueing Techniques: Verbal cues      Exercises     Shoulder Instructions      Home Living Family/patient expects to be discharged to:: Private residence Living Arrangements: Spouse/significant other Available Help at Discharge: Family;Available 24 hours/day Type of  Home: House Home Access: Stairs to enter     Home Layout: Two level     Bathroom Shower/Tub: Chief Strategy Officer: Standard     Home Equipment: Cane - single Librarian, academic (2 wheels)          Prior Functioning/Environment Prior Level of Function :  (Pt. states he was not using AD in his home but used cane when he lerft his home.)               ADLs Comments: Pt. states he was I to Mod i with ADLs.    OT Problem List: Decreased activity tolerance   OT Treatment/Interventions: Self-care/ADL training;DME and/or AE instruction;Therapeutic activities;Patient/family education      OT Goals(Current goals can be found in the care plan section)   Acute Rehab OT Goals Patient Stated Goal: he wants to go home. OT Goal Formulation: With patient Time For Goal Achievement: 12/07/23 Potential to Achieve Goals: Good ADL Goals Pt Will Perform Upper Body Bathing: sitting;with set-up Pt Will Perform Lower Body Bathing: with supervision;sit to/from stand Pt Will Perform Upper Body Dressing: with supervision;sitting Pt Will Perform Lower Body Dressing: with supervision;sit to/from stand Pt Will Transfer to Toilet: with supervision;ambulating   OT Frequency:  Min 2X/week    Co-evaluation              AM-PAC OT "6 Clicks" Daily Activity     Outcome Measure Help from another person eating meals?: None Help from another person taking care of personal grooming?: A Little Help from another person toileting, which includes using toliet, bedpan, or urinal?: A Little Help from another person bathing (including washing, rinsing, drying)?: A Little Help from another person to put on and taking off regular upper body clothing?: A Little Help from another person to put on and taking off regular lower body clothing?: A Little 6 Click Score: 19   End of Session Equipment Utilized During Treatment: Rolling walker (2 wheels) Nurse Communication: Patient requests  pain meds (Pt. nurse ok therapy)  Activity Tolerance: Patient limited by pain Patient left: in chair;with call bell/phone within reach;with chair alarm set  OT Visit Diagnosis: Unsteadiness on feet (R26.81);Other abnormalities of gait and mobility (R26.89)                Time: 2956-2130 OT Time Calculation (min): 39 min Charges:  OT General Charges $OT Visit: 1 Visit OT Evaluation $OT Eval Moderate Complexity: 1 Mod OT Treatments $Self Care/Home Management : 8-22 mins  Neomi Banks OT/L   Zaydenn Balaguer 11/23/2023, 11:08 AM

## 2023-11-23 NOTE — TOC Initial Note (Addendum)
 Transition of Care [facility]) - Initial/Assessment Note    Patient Details  Name: Patrick [name] MRN: [id number] Date of Birth: 1945-09-17  Transition [facility] CM/SW Contact:    Omie Bickers, RN Phone Number: 11/23/2023, 11:45 AM  Clinical Narrative:                  Eldora Greet w patient's son, Veryl Gottron, to discuss needs for DC.  He states that the patient and live together, but he was just released from the hospital and is unsure if he can take care of him at DC. He provided me with his brother Jamie's number. I called him and LVM requesting call back.   11:50 Spoke w Carolyn Cisco, he confirms that the patient will stay with him at DC, he is agreeable to CM finding [facility] agency, no preference. He would like RW. I have ordered this to be delivered to the room before DC.  Referral pending to [facility] Address will be  [address] [place] [address]  19:14 Referral accepted by Lisle Ridges, Carolyn Cisco (Son) [phone]    Heidecker,[name] (Son) [phone]    Expected Discharge Plan: Home w Home Health Services Barriers to Discharge: Continued Medical Work up   Patient Goals and CMS Choice            Expected Discharge Plan and Services   Discharge Planning Services: CM Consult   Living arrangements for the past 2 months: Single Family Home                                      Prior Living Arrangements/Services Living arrangements for the past 2 months: Single Family Home Lives with:: Adult Children                   Activities of Daily Living   ADL Screening (condition at time of admission) Independently performs ADLs?: Yes (appropriate for developmental age) Is the patient deaf or have difficulty hearing?: No Does the patient have difficulty seeing, even when wearing glasses/contacts?: No Does the patient have difficulty concentrating, remembering, or making decisions?: No  Permission Sought/Granted                  Emotional  Assessment              Admission diagnosis:  Stroke (cerebrum) (HCC) [I63.9] Elevated troponin [R79.89] Hypertensive urgency [I16.0] Hypertensive emergency [I16.1] Chronic kidney disease, unspecified CKD stage [N18.9] Patient Active Problem List   Diagnosis Date Noted   Stroke (cerebrum) (HCC) 11/22/2023   Hypertensive emergency 11/21/2023   History of atrial fibrillation 11/16/2021   Uncontrolled type 2 diabetes mellitus with hyperglycemia (HCC) 11/16/2021   Cerebrovascular accident (CVA) due to bilateral embolism of middle cerebral arteries (HCC)    History of stroke    Systolic heart failure (HCC) 11/18/2015   Hematuria 08/30/2015   Chronic anemia 03/31/2014   Healthcare maintenance 03/31/2014   Permanent atrial fibrillation (HCC) 07/17/2013   Chronic anticoagulation, on coumadin  07/17/2013   Tachy-brady syndrome (HCC) 07/17/2013   Acute ischemic VBA thalamic stroke (HCC) 12/11/2010   Chronic kidney disease (CKD), stage IV (severe) (HCC) 12/21/2008   Coronary atherosclerosis 07/19/2006   Diabetes mellitus (HCC) 06/05/2006   Hyperlipidemia 06/05/2006   Gout 06/05/2006   Essential hypertension 06/05/2006   Sleep apnea 06/05/2006   PCP:  Duncan Gibson, NP Pharmacy:   [facility] [phone] -  [place], [place] - [address] [place] [address] Phone: [phone] Fax: [phone]  [id number] [place], [place] - [address] AT [facility] [address] [facility] [address]  [place] [address] Phone: [phone] Fax: [phone]  [place] [facility] [address], [address] [place] [address] Phone: [phone] Fax: [phone]  [facility] [address] [place], [place] - [address] AT [facility] [address] [place] [address] Phone: [phone] Fax: [phone]     Social Drivers of Health  (SDOH) Social History: SDOH Screenings   Food Insecurity: No Food Insecurity (11/22/2023)  Housing: Low Risk  (11/22/2023)  Transportation Needs: No Transportation Needs (11/22/2023)  Utilities: Not At Risk (11/22/2023)  Depression (PHQ2-9): Low Risk  (05/24/2023)  Physical Activity: Inactive (10/12/2019)  Social Connections: Moderately Integrated (11/22/2023)  Tobacco Use: Medium Risk (11/21/2023)   SDOH Interventions:     Readmission Risk Interventions     No data to display

## 2023-11-23 NOTE — Evaluation (Signed)
 Physical Therapy Evaluation Patient Details Name: Patrick [name] MRN: [id number] DOB: 10-Apr-1946 Today's Date: 11/23/2023  History of Present Illness  Patient is a 78 y.o.  male admitted 4/24 with headache, difficulty walking, presyncope-upon further evaluation-was found to have hypertensive urgency and acute right cerebellar stroke.  PMHx: A-fib on Eliquis , CAD, stage IV CKD, prior CVA, vascular dementia.   Clinical Impression  Pt admitted with above diagnosis. Only complains of soreness in Lt foot which interestingly he states he has experienced before. Able to ambulate slowly with RW for support at supervision level. CGA for safety without assistive device, showing mild balance impairment, reaching for furniture. CGA with transfers. Lives with wife and son, states 24/7 assist available at home. Agreeable to HHPT follow-up. Will follow an progress acutely. Main bed/bath on 2nd level of home. Pt currently with functional limitations due to the deficits listed below (see PT Problem List). Pt will benefit from acute skilled PT to increase their independence and safety with mobility to allow discharge.           If plan is discharge home, recommend the following: A little help with walking and/or transfers;A little help with bathing/dressing/bathroom;Assistance with cooking/housework;Direct supervision/assist for medications management;Direct supervision/assist for financial management;Assist for transportation;Help with stairs or ramp for entrance;Supervision due to cognitive status   Can travel by private vehicle        Equipment Recommendations None recommended by PT  Recommendations for Other Services       Functional Status Assessment Patient has had a recent decline in their functional status and demonstrates the ability to make significant improvements in function in a reasonable and predictable amount of time.     Precautions / Restrictions Precautions Precautions: Fall Recall of  Precautions/Restrictions: Impaired Precaution/Restrictions Comments: Permissive HTN; to normalize range ~1455 4/26 Restrictions Weight Bearing Restrictions Per Provider Order: No      Mobility  Bed Mobility Overal bed mobility: Needs Assistance Bed Mobility: Supine to Sit     Supine to sit: Supervision     General bed mobility comments: Supervision for safety, slower and effortful without assist.    Transfers Overall transfer level: Needs assistance Equipment used: Rolling walker (2 wheels) Transfers: Sit to/from Stand Sit to Stand: Contact guard assist           General transfer comment: CGA to rise from low bed setting, RW for support upon standing. Slow rise.    Ambulation/Gait Ambulation/Gait assistance: Contact guard assist Gait Distance (Feet): 75 Feet Assistive device: Rolling walker (2 wheels), None Gait Pattern/deviations: Step-through pattern, Decreased stride length, Trunk flexed Gait velocity: dec Gait velocity interpretation: <1.31 ft/sec, indicative of household ambulator   General Gait Details: CGA without assistive device, slower, guarded, reaching for furniture, mild instability. With RW for support, educated on use, pt able to ambulate with supervision, no evidence of overt LOB or physical assist.  Stairs            Wheelchair Mobility     Tilt Bed    Modified Rankin (Stroke Patients Only) Modified Rankin (Stroke Patients Only) Pre-Morbid Rankin Score: No significant disability Modified Rankin: Moderately severe disability     Balance                                             Pertinent Vitals/Pain Pain Assessment Pain Assessment: 0-10 Pain Score: 5  Pain Location: L top  of foot Pain Descriptors / Indicators: Aching Pain Intervention(s): Monitored during session, Repositioned    Home Living Family/patient expects to be discharged to:: Private residence Living Arrangements: Spouse/significant  other;Children Available Help at Discharge: Family;Available 24 hours/day Type of Home: House Home Access: Stairs to enter Entrance Stairs-Rails: None Entrance Stairs-Number of Steps: 1 Alternate Level Stairs-Number of Steps: 13 Home Layout: Two level;Bed/bath upstairs Home Equipment: Cane - single point;Rolling Walker (2 wheels)      Prior Function Prior Level of Function :  (Pt. states he was not using AD in his home but used cane when he lerft his home.)             Mobility Comments: SPC outside, no AD in home. Denies falls. ADLs Comments: Pt. states he was I to Mod i with ADLs.     Extremity/Trunk Assessment   Upper Extremity Assessment Upper Extremity Assessment: Defer to OT evaluation    Lower Extremity Assessment Lower Extremity Assessment: Generalized weakness (reports normal light touch throughout.)       Communication   Communication Communication: Impaired Factors Affecting Communication: Reduced clarity of speech    Cognition Arousal: Alert Behavior During Therapy: Flat affect   PT - Cognitive impairments: No family/caregiver present to determine baseline, Memory, Problem solving, Awareness, Orientation   Orientation impairments: Situation                   PT - Cognition Comments: Recalls admission for difficulty walking, does not remember where he was eating PTA, unsure of diagnosis (i'm not sure if he was told?Aaron Aas) Following commands: Impaired Following commands impaired: Only follows one step commands consistently, Follows one step commands with increased time     Cueing Cueing Techniques: Verbal cues     General Comments      Exercises     Assessment/Plan    PT Assessment Patient needs continued PT services  PT Problem List Decreased strength;Decreased activity tolerance;Decreased balance;Decreased mobility;Decreased coordination;Decreased cognition;Decreased knowledge of use of DME;Decreased safety awareness;Decreased knowledge of  precautions       PT Treatment Interventions DME instruction;Gait training;Stair training;Functional mobility training;Therapeutic activities;Therapeutic exercise;Balance training;Neuromuscular re-education;Patient/family education    PT Goals (Current goals can be found in the Care Plan section)  Acute Rehab PT Goals Patient Stated Goal: get well, return home, regain strength PT Goal Formulation: With patient Time For Goal Achievement: 12/06/23 Potential to Achieve Goals: Good    Frequency Min 2X/week     Co-evaluation               AM-PAC PT "6 Clicks" Mobility  Outcome Measure Help needed turning from your back to your side while in a flat bed without using bedrails?: None Help needed moving from lying on your back to sitting on the side of a flat bed without using bedrails?: None Help needed moving to and from a bed to a chair (including a wheelchair)?: A Little Help needed standing up from a chair using your arms (e.g., wheelchair or bedside chair)?: A Little Help needed to walk in hospital room?: A Little Help needed climbing 3-5 steps with a railing? : A Little 6 Click Score: 20    End of Session Equipment Utilized During Treatment: Gait belt Activity Tolerance: Patient tolerated treatment well Patient left: in chair;with call bell/phone within reach;with chair alarm set (Lab arrived for blood)   PT Visit Diagnosis: Unsteadiness on feet (R26.81);Other abnormalities of gait and mobility (R26.89);Muscle weakness (generalized) (M62.81);Other symptoms and signs involving the nervous system (  R29.898)    Time: 1610-9604 PT Time Calculation (min) (ACUTE ONLY): 23 min   Charges:   PT Evaluation $PT Eval Low Complexity: 1 Low PT Treatments $Gait Training: 8-22 mins PT General Charges $$ ACUTE PT VISIT: 1 Visit         Jory Ng, PT, DPT [facility]  Rehabilitation Services Physical Therapist Office: [phone] Website: [email or web address]   Alinda Irani 11/23/2023, 11:22 AM

## 2023-11-23 NOTE — Plan of Care (Signed)

## 2023-11-23 NOTE — Progress Notes (Signed)
 STROKE TEAM PROGRESS NOTE   SUBJECTIVE (INTERVAL HISTORY) No family is at the bedside.  Patient sitting in chair, mildly lethargic, however orientated except month.  He admitted that he forgets taking medication sometimes but stated that his son can help him with medication at home.   OBJECTIVE Temp:  [97.2 F (36.2 C)-98.5 F (36.9 C)] 97.5 F (36.4 C) (04/26 1637) Pulse Rate:  [67-84] 72 (04/26 1637) Cardiac Rhythm: Atrial fibrillation (04/26 0716) Resp:  [11-21] 17 (04/26 1637) BP: (147-189)/(72-105) 170/86 (04/26 1637) SpO2:  [93 %-99 %] 97 % (04/26 1637)  No results for input(s): "GLUCAP" in the last 168 hours. Recent Labs  Lab 11/21/23 1820 11/22/23 0128 11/23/23 0936  NA 143 144 142  K 3.3* 3.4* 3.4*  CL 109 111 110  CO2 19* 16* 20*  GLUCOSE 117* 122* 104*  BUN 89* 94* 93*  CREATININE 5.26* 5.10* 5.38*  CALCIUM  8.0* 8.0* 7.8*  MG  --  1.7 1.9  PHOS  --  4.6  --    Recent Labs  Lab 11/22/23 0128  AST 18  ALT 11  ALKPHOS 79  BILITOT 1.0  PROT 6.8  ALBUMIN 3.2*   Recent Labs  Lab 11/21/23 1820 11/22/23 0128  WBC 4.6 4.3  NEUTROABS 3.3  --   HGB 10.7* 10.6*  HCT 34.7* 34.9*  MCV 85.0 85.7  PLT 153 167   No results for input(s): "CKTOTAL", "CKMB", "CKMBINDEX", "TROPONINI" in the last 168 hours. Recent Labs    11/21/23 1820  LABPROT 15.5*  INR 1.2   Recent Labs    11/21/23 2000  COLORURINE YELLOW  LABSPEC 1.011  PHURINE 6.0  GLUCOSEU 50*  HGBUR SMALL*  BILIRUBINUR NEGATIVE  KETONESUR 5*  PROTEINUR >=300*  NITRITE NEGATIVE  LEUKOCYTESUR NEGATIVE       Component Value Date/Time   CHOL 156 11/22/2023 0128   CHOL 204 (H) 12/20/2020 1639   TRIG 68 11/22/2023 0128   HDL 58 11/22/2023 0128   HDL 62 12/20/2020 1639   CHOLHDL 2.7 11/22/2023 0128   VLDL 14 11/22/2023 0128   LDLCALC 84 11/22/2023 0128   LDLCALC 86 12/21/2022 1418   Lab Results  Component Value Date   HGBA1C 5.4 11/22/2023      Component Value Date/Time   LABOPIA NONE  DETECTED 11/22/2010 0831   COCAINSCRNUR NONE DETECTED 11/22/2010 0831   LABBENZ NONE DETECTED 11/22/2010 0831   AMPHETMU NONE DETECTED 11/22/2010 0831   THCU NONE DETECTED 11/22/2010 0831   LABBARB  11/22/2010 0831    NONE DETECTED        DRUG SCREEN FOR MEDICAL PURPOSES ONLY.  IF CONFIRMATION IS NEEDED FOR ANY PURPOSE, NOTIFY LAB WITHIN 5 DAYS.        LOWEST DETECTABLE LIMITS FOR URINE DRUG SCREEN Drug Class       Cutoff (ng/mL) Amphetamine      1000 Barbiturate      200 Benzodiazepine   200 Tricyclics       300 Opiates          300 Cocaine          300 THC              50    No results for input(s): "ETH" in the last 168 hours.  I have personally reviewed the radiological images below and agree with the radiology interpretations.  VAS US  CAROTID (at [facility] and [facility] only) Result Date: 11/22/2023 Carotid Arterial Duplex Study Patient Name:  EULOJIO SCALZO  Date  of Exam:   11/22/2023 Medical Rec #: [id number]     Accession #:    [id number] Date of Birth: 1946/05/16      Patient Gender: M Patient Age:   78 years Exam Location:  [facility] Procedure:      VAS US  CAROTID Referring Phys: Vada Garibaldi --------------------------------------------------------------------------------  Indications:       TIA and Onset headache. Risk Factors:      Hypertension, Diabetes. Other Factors:     H/O stroke, A-fid on Eliquis . Limitations        Today's exam was limited due to Extremely tortuous vessels. Comparison Study:  Previous study on 4.23.2017. Performing Technologist: Ria Chad  Examination Guidelines: A complete evaluation includes B-mode imaging, spectral Doppler, color Doppler, and power Doppler as needed of all accessible portions of each vessel. Bilateral testing is considered an integral part of a complete examination. Limited examinations for reoccurring indications may be performed as noted.  Right Carotid Findings: +----------+--------+--------+--------+------------------+--------+            PSV cm/sEDV cm/sStenosisPlaque DescriptionComments +----------+--------+--------+--------+------------------+--------+ CCA Prox  47      6                                          +----------+--------+--------+--------+------------------+--------+ CCA Distal49      15                                         +----------+--------+--------+--------+------------------+--------+ ICA Prox  42      18              calcific                   +----------+--------+--------+--------+------------------+--------+ ICA Mid   86      31                                         +----------+--------+--------+--------+------------------+--------+ ICA Distal52      23                                         +----------+--------+--------+--------+------------------+--------+ ECA       81                                                 +----------+--------+--------+--------+------------------+--------+ +----------+--------+-------+--------+-------------------+           PSV cm/sEDV cmsDescribeArm Pressure (mmHG) +----------+--------+-------+--------+-------------------+ Subclavian170                                        +----------+--------+-------+--------+-------------------+ +---------+--------+--+--------+--+ VertebralPSV cm/s85EDV cm/s32 +---------+--------+--+--------+--+  Left Carotid Findings: +----------+--------+--------+--------+------------------+--------+           PSV cm/sEDV cm/sStenosisPlaque DescriptionComments +----------+--------+--------+--------+------------------+--------+ CCA Prox  46      6                                          +----------+--------+--------+--------+------------------+--------+  CCA Distal39      7                                          +----------+--------+--------+--------+------------------+--------+ ICA Prox  49      17              calcific                    +----------+--------+--------+--------+------------------+--------+ ICA Mid   50      16                                         +----------+--------+--------+--------+------------------+--------+ ICA Distal47      18                                         +----------+--------+--------+--------+------------------+--------+ ECA       65      5                                          +----------+--------+--------+--------+------------------+--------+ +----------+--------+--------+--------+-------------------+           PSV cm/sEDV cm/sDescribeArm Pressure (mmHG) +----------+--------+--------+--------+-------------------+ Subclavian132                                         +----------+--------+--------+--------+-------------------+ +---------+--------+--+--------+--+ VertebralPSV cm/s45EDV cm/s13 +---------+--------+--+--------+--+   Summary: Right Carotid: Velocities in the right ICA are consistent with a 1-39% stenosis. Left Carotid: Velocities in the left ICA are consistent with a 1-39% stenosis. Vertebrals:  Bilateral vertebral arteries demonstrate antegrade flow. Subclavians: Normal flow hemodynamics were seen in bilateral subclavian              arteries. *See table(s) above for measurements and observations.  Electronically signed by Jimmye Moulds MD on 11/22/2023 at 12:08:48 PM.    Final    ECHOCARDIOGRAM COMPLETE Result Date: 11/22/2023    ECHOCARDIOGRAM REPORT   Patient Name:   TEO WEISEL Date of Exam: 11/22/2023 Medical Rec #:  [id number]    Height:       67.0 in Accession #:    [id number]   Weight:       175.5 lb Date of Birth:  30-Mar-1946     BSA:          1.913 m Patient Age:    77 years     BP:           187/92 mmHg Patient Gender: M            HR:           69 bpm. Exam Location:  Inpatient Procedure: 2D Echo (Both Spectral and Color Flow Doppler were utilized during            procedure). Indications:    elevated troponin  History:        Patient has  prior history of Echocardiogram examinations, most                 recent 08/03/2022. Chronic  kidney disease, Arrythmias:Atrial                 Fibrillation and PVC; Risk Factors:Hypertension, Dyslipidemia,                 Diabetes and Sleep Apnea.  Sonographer:    Dione Franks RDCS Referring Phys: [id number] SARA-MAIZ A THOMAS IMPRESSIONS  1. Left ventricular ejection fraction, by estimation, is 55 to 60%. The left ventricle has normal function. The left ventricle has no regional wall motion abnormalities. There is severe concentric left ventricular hypertrophy. Left ventricular diastolic  parameters are indeterminate.  2. Right ventricular systolic function is normal. The right ventricular size is normal. There is moderately elevated pulmonary artery systolic pressure. The estimated right ventricular systolic pressure is 59.8 mmHg.  3. Left atrial size was severely dilated.  4. Right atrial size was moderately dilated.  5. A small pericardial effusion is present. The pericardial effusion is posterior and lateral to the left ventricle. There is no evidence of cardiac tamponade.  6. The mitral valve is degenerative. Mild mitral valve regurgitation. No evidence of mitral stenosis. Moderate mitral annular calcification.  7. The aortic valve is tricuspid. There is mild calcification of the aortic valve. Aortic valve regurgitation is mild. No aortic stenosis is present.  8. Aortic dilatation noted. There is borderline dilatation of the aortic root, measuring 38 mm.  9. The inferior vena cava is normal in size with <50% respiratory variability, suggesting right atrial pressure of 8 mmHg. FINDINGS  Left Ventricle: Left ventricular ejection fraction, by estimation, is 55 to 60%. The left ventricle has normal function. The left ventricle has no regional wall motion abnormalities. The left ventricular internal cavity size was normal in size. There is  severe concentric left ventricular hypertrophy. Left ventricular diastolic  parameters are indeterminate. Right Ventricle: The right ventricular size is normal. No increase in right ventricular wall thickness. Right ventricular systolic function is normal. There is moderately elevated pulmonary artery systolic pressure. The tricuspid regurgitant velocity is 3.70 m/s, and with an assumed right atrial pressure of 5 mmHg, the estimated right ventricular systolic pressure is 59.8 mmHg. Left Atrium: Left atrial size was severely dilated. Right Atrium: Right atrial size was moderately dilated. Pericardium: A small pericardial effusion is present. The pericardial effusion is posterior and lateral to the left ventricle. There is no evidence of cardiac tamponade. Mitral Valve: The mitral valve is degenerative in appearance. There is mild calcification of the mitral valve leaflet(s). Moderate mitral annular calcification. Mild mitral valve regurgitation. No evidence of mitral valve stenosis. Tricuspid Valve: The tricuspid valve is normal in structure. Tricuspid valve regurgitation is trivial. No evidence of tricuspid stenosis. Aortic Valve: The aortic valve is tricuspid. There is mild calcification of the aortic valve. Aortic valve regurgitation is mild. Aortic regurgitation PHT measures 449 msec. No aortic stenosis is present. Pulmonic Valve: The pulmonic valve was normal in structure. Pulmonic valve regurgitation is trivial. No evidence of pulmonic stenosis. Aorta: Aortic dilatation noted. There is borderline dilatation of the aortic root, measuring 38 mm. Venous: The inferior vena cava is normal in size with less than 50% respiratory variability, suggesting right atrial pressure of 8 mmHg. IAS/Shunts: No atrial level shunt detected by color flow Doppler.  LEFT VENTRICLE PLAX 2D LVIDd:         4.60 cm   Diastology LVIDs:         3.10 cm   LV e' lateral: 6.42 cm/s LV PW:  1.60 cm LV IVS:        1.50 cm LVOT diam:     1.90 cm LV SV:         43 LV SV Index:   22 LVOT Area:     2.84 cm  RIGHT  VENTRICLE            IVC RV S prime:     7.94 cm/s  IVC diam: 1.50 cm TAPSE (M-mode): 0.8 cm LEFT ATRIUM             Index        RIGHT ATRIUM           Index LA diam:        4.10 cm 2.14 cm/m   RA Area:     21.00 cm LA Vol (A2C):   80.0 ml 41.82 ml/m  RA Volume:   63.20 ml  33.04 ml/m LA Vol (A4C):   95.1 ml 49.71 ml/m LA Biplane Vol: 92.1 ml 48.15 ml/m  AORTIC VALVE LVOT Vmax:   77.90 cm/s LVOT Vmean:  49.900 cm/s LVOT VTI:    0.150 m AI PHT:      449 msec  AORTA Ao Root diam: 3.80 cm Ao Asc diam:  3.30 cm TRICUSPID VALVE TR Peak grad:   54.8 mmHg TR Vmax:        370.00 cm/s  SHUNTS Systemic VTI:  0.15 m Systemic Diam: 1.90 cm Jules Oar MD Electronically signed by Jules Oar MD Signature Date/Time: 11/22/2023/11:16:48 AM    Final    MR BRAIN WO CONTRAST Result Date: 11/22/2023 CLINICAL DATA:  Headache EXAM: MRI HEAD WITHOUT CONTRAST TECHNIQUE: Multiplanar, multiecho pulse sequences of the brain and surrounding structures were obtained without intravenous contrast. COMPARISON:  11/21/2015 FINDINGS: Brain: Small focus of acute ischemia in the right cerebellum. Hemosiderin deposition in the right cerebellum at the site of old infarct. Multiple chronic microhemorrhages in a predominantly peripheral distribution. Bihemispheric subarachnoid siderosis. There is multifocal hyperintense T2-weighted signal within the white matter. Generalized volume loss. Old right frontal and occipital infarcts. The midline structures are normal. Vascular: Normal flow voids. Skull and upper cervical spine: Normal calvarium and skull base. Visualized upper cervical spine and soft tissues are normal. Sinuses/Orbits:No paranasal sinus fluid levels or advanced mucosal thickening. No mastoid or middle ear effusion. Normal orbits. IMPRESSION: 1. Small focus of acute ischemia in the right cerebellum. No hemorrhage or mass effect. 2. Multiple chronic microhemorrhages in a predominantly peripheral distribution, which may  indicate cerebral amyloid angiopathy. 3. Old right frontal and occipital infarcts. Electronically Signed   By: Juanetta Nordmann M.D.   On: 11/22/2023 00:34   DG Chest Port 1 View Result Date: 11/21/2023 CLINICAL DATA:  Weakness. EXAM: PORTABLE CHEST 1 VIEW COMPARISON:  Chest radiograph dated Dec 08, 2015. FINDINGS: Stable cardiomegaly. Aortic atherosclerosis. No focal consolidation, pleural effusion, or pneumothorax. No acute osseous abnormality. IMPRESSION: 1. No acute cardiopulmonary findings. 2. Stable cardiomegaly. Electronically Signed   By: Mannie Seek M.D.   On: 11/21/2023 19:52   CT Head Wo Contrast Result Date: 11/21/2023 CLINICAL DATA:  Headache EXAM: CT HEAD WITHOUT CONTRAST TECHNIQUE: Contiguous axial images were obtained from the base of the skull through the vertex without intravenous contrast. RADIATION DOSE REDUCTION: This exam was performed according to the departmental dose-optimization program which includes automated exposure control, adjustment of the mA and/or kV according to patient size and/or use of iterative reconstruction technique. COMPARISON:  Head CT 01/30/2016. FINDINGS: Brain: No evidence of acute  infarction, hemorrhage, hydrocephalus, or acute extra-axial collection or mass lesion/mass effect. Bifrontal low-density subdural fluid collections are unchanged from the prior study. Left Vascular: Atherosclerotic calcifications are present within the cavernous internal carotid arteries. Skull: Normal. Negative for fracture or focal lesion. Sinuses/Orbits: No acute finding. Other: None. IMPRESSION: 1. No acute intracranial process. 2. 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 Electronically Signed   By: Tyron Gallon M.D.   On: 11/21/2023 19:47     PHYSICAL EXAM  Temp:  [97.2 F (36.2 C)-98.5 F (36.9 C)] 97.5 F (36.4 C) (04/26 1637) Pulse Rate:  [67-84] 72 (04/26 1637) Resp:  [11-21] 17 (04/26  1637) BP: (147-189)/(72-105) 170/86 (04/26 1637) SpO2:  [93 %-99 %] 97 % (04/26 1637)  General - Well nourished, well developed, in no apparent distress.  Ophthalmologic - fundi not visualized due to noncooperation.  Cardiovascular - irregularly irregular heart rate and rhythm.  Neuro - awake, alert, eyes open, orientated to age, place, year but not to month. No aphasia, paucity of speech, following all simple commands with mild psychomotor slowing. Able to name and repeat simple sentence. No gaze palsy, tracking bilaterally, visual field full. No facial droop. Tongue midline. Bilateral UEs 5/5, no drift. Bilaterally LEs 4/5, no drift. Sensation symmetrical bilaterally, b/l FTN intact, gait not tested.     ASSESSMENT/PLAN Mr. Agasthya Lewelling is a 78 y.o. male with history of hypertension, diabetes, hyperlipidemia, CKD 4, CAD/MI, OSA, A-fib on Eliquis , vascular dementia, stroke admitted for imbalance and gait difficulty. No TNK given due to outside window.    Stroke:  right cerebellar infarct, likely cardioembolic due to to afib not compliant with Eliquis  vs. small vessel disease source CT no acute abnormality MRI right cerebellar small linear infarct MRA pending Carotid Doppler unremarkable 2D Echo EF 55 to 60% LDL 84 HgbA1c 6.2 Eliquis  for VTE prophylaxis Eliquis  (apixaban ) 5mg  twice daily prior to admission, now on Eliquis  2.5mg  (apixaban ) twice daily given uncontrolled BP, CKD IV and CAA.  Patient counseled to be compliant with his antithrombotic medications and he agrees that his son will help him for medication  Ongoing aggressive stroke risk factor management Therapy recommendations: Home health PT and OT Disposition: Pending  History of stroke 10/2000 left thalamic infarct 10/2015 admitted for headache, high BP, right upper and lower extremity weakness.  INR 1.97 on Coumadin .  MRI showed bilateral ACA scattered infarcts.  Carotid Doppler negative.  EF 40 to 45%.  LDL 65, A1c 8.5.   Continue on Coumadin  and pravastatin   A-fib Was on Coumadin  in the past Currently on Eliquis  5mg  bid at home but missing several doses a week per family Now recommend Eliquis  2.5 mg twice a day given uncontrolled BP, CKD 4 and CAA. Recommend patient's son to help him with medication at home.  CAA MRI brain showed Multiple chronic microhemorrhages and hemosiderosis in a predominantly peripheral distribution, which may indicate cerebral amyloid angiopathy Recommend strict BP control with SBP less than 140 Decreased Eliquis  from 5 to 2.5 twice daily  Diabetes HgbA1c 6.2 goal < 7.0 Controlled CBG monitoring SSI DM education and close PCP follow up  Hypertension Home meds including Coreg  6.25 twice daily and hydralazine  50 mg 3 times daily Unstable, high Continue Coreg  6.25 Increase hydralazine  from 50 to 100 Long term BP goal normotensive  Hyperlipidemia Home meds: None LDL 84, goal < 70 Now on Lipitor 20 No high intensity statin given CAA, LDL not far from goal and advanced  age Continue statin at discharge  Other Stroke Risk Factors Advanced age Coronary artery disease and MI Obstructive sleep apnea, on CPAP at home  Other Active Problems Vascular dementia CKD 4, creatinine 5.26--5.10  Hospital day # 2  I discussed with Dr. Hilton Lucky and pharmacist. I spent extensive face-to-face time with the patient, more than 50% of which was spent in counseling and coordination of care, reviewing test results, images and medication, and discussing the diagnosis, treatment plan and potential prognosis. This patient's care requiresreview of multiple databases, neurological assessment, discussion with family, other specialists and medical decision making of high complexity.    Consuelo Denmark, MD PhD Stroke Neurology 11/23/2023 5:13 PM    To contact Stroke Continuity provider, please refer to [email or web address]. After hours, contact General Neurology

## 2023-11-23 NOTE — Progress Notes (Signed)
 PROGRESS NOTE        PATIENT DETAILS Name: Patrick [name] Age: 78 y.o. Sex: male Date of Birth: 02-Apr-1946 Admit Date: 11/21/2023 Admitting Physician Vada Garibaldi, MD ZOX:[id number], Sid Dragon, NP  Brief Summary: Patient is a 78 y.o.  male with history of A-fib on Eliquis , CAD, stage IV CKD, prior CVA, vascular dementia who presented with headache, difficulty walking, presyncope-upon further evaluation-was found to have hypertensive urgency and acute right cerebellar stroke.  Significant events: 4/24>> admit to [facility]  Significant studies: 4/25>> MRI brain: Acute ischemia right cerebellum. 4/25>> carotid Doppler: 1-39% stenosis bilaterally. 4/25>> echo: EF 55-60%, moderate elevated pulmonary systolic pressure.  RVSP 59.8.  Small pericardial effusion-no tamponade. 4/25>> A1c: 5.4 4/25>> LDL: 84  Significant microbiology data: None  Procedures: None  Consults: Neurology  Subjective: Lying comfortably in bed-denies any chest pain or shortness of breath.  Denies any shortness of breath or chest pain.  Moving all 4 extremities.  Objective: Vitals: Blood pressure (!) 168/72, pulse 72, temperature (!) 97.4 F (36.3 C), temperature source Axillary, resp. rate 16, height 5\' 7"  (1.702 m), weight 79.6 kg, SpO2 94%.   Exam: Gen Exam:Alert awake-not in any distress HEENT:atraumatic, normocephalic Chest: B/L clear to auscultation anteriorly CVS:S1S2 regular Abdomen:soft non tender, non distended Extremities:no edema Neurology: Non focal Skin: no rash  Pertinent Labs/Radiology:    Latest Ref Rng & Units 11/22/2023    1:28 AM 11/21/2023    6:20 PM 10/29/2023   12:57 PM  CBC  WBC 4.0 - 10.5 K/uL 4.3  4.6    Hemoglobin 13.0 - 17.0 g/dL 91.4  78.2  95.6   Hematocrit 39.0 - 52.0 % 34.9  34.7    Platelets 150 - 400 K/uL 167  153      Lab Results  Component Value Date   NA 144 11/22/2023   K 3.4 (L) 11/22/2023   CL 111 11/22/2023   CO2 16 (L) 11/22/2023       Assessment/Plan: Acute CVA Probably embolic-secondary to poor compliance with Eliquis  due to dementia. No focal deficits on exam Workup as above Await PT/OT Await further recommendations from neurology/stroke team Will need to discuss with family-to see if we can come up with a strategy for reliable administration of Eliquis  daily.  Hypertensive urgency Blood pressure significantly elevated on admission-allowing some amount of permissive hypertension Blood pressure reasonable this morning Continue amlodipine /Coreg /hydralazine   Chronic atrial fibrillation Rate controlled Telemetry monitoring Eliquis /Coreg   HLD On high intensity statin  AKI on CKD 4 Creatinine improving with supportive care Awaiting repeat labs this morning  Normocytic anemia Secondary to CKD 4 Follow Hb periodically.  Gout No flare Allopurinol   Vascular dementia Pleasantly confused but easily redirectable Delirium precautions.  BMI: Estimated body mass index is 27.49 kg/m as calculated from the following:   Height as of this encounter: 5\' 7"  (1.702 m).   Weight as of this encounter: 79.6 kg.   Code status:   Code Status: Full Code   DVT Prophylaxis: apixaban  (ELIQUIS ) tablet 5 mg     Family Communication: None at bedside   Disposition Plan: Status is: Inpatient Remains inpatient appropriate because: Severity of illness.   Planned Discharge Destination:Home health   Diet: Diet Order             Diet Heart Room service appropriate? Yes; Fluid consistency: Thin  Diet effective now  Antimicrobial agents: Anti-infectives (From admission, onward)    None        MEDICATIONS: Scheduled Meds:   stroke: early stages of recovery book   Does not apply Once   allopurinol   50 mg Oral QODAY   amLODipine   10 mg Oral Daily   apixaban   5 mg Oral BID   atorvastatin   20 mg Oral Daily   carvedilol   6.25 mg Oral BID WC   hydrALAZINE   50 mg Oral TID    Continuous Infusions: PRN Meds:.acetaminophen  **OR** acetaminophen , labetalol , levalbuterol , ondansetron  **OR** ondansetron  (ZOFRAN ) IV   I have personally reviewed following labs and imaging studies  LABORATORY DATA: CBC: Recent Labs  Lab 11/21/23 1820 11/22/23 0128  WBC 4.6 4.3  NEUTROABS 3.3  --   HGB 10.7* 10.6*  HCT 34.7* 34.9*  MCV 85.0 85.7  PLT 153 167    Basic Metabolic Panel: Recent Labs  Lab 11/21/23 1820 11/22/23 0128  NA 143 144  K 3.3* 3.4*  CL 109 111  CO2 19* 16*  GLUCOSE 117* 122*  BUN 89* 94*  CREATININE 5.26* 5.10*  CALCIUM  8.0* 8.0*  MG  --  1.7  PHOS  --  4.6    GFR: Estimated Creatinine Clearance: 12.7 mL/min (A) (by C-G formula based on SCr of 5.1 mg/dL (H)).  Liver Function Tests: Recent Labs  Lab 11/22/23 0128  AST 18  ALT 11  ALKPHOS 79  BILITOT 1.0  PROT 6.8  ALBUMIN 3.2*   No results for input(s): "LIPASE", "AMYLASE" in the last 168 hours. No results for input(s): "AMMONIA" in the last 168 hours.  Coagulation Profile: Recent Labs  Lab 11/21/23 1820  INR 1.2    Cardiac Enzymes: No results for input(s): "CKTOTAL", "CKMB", "CKMBINDEX", "TROPONINI" in the last 168 hours.  BNP (last 3 results) No results for input(s): "PROBNP" in the last 8760 hours.  Lipid Profile: Recent Labs    11/22/23 0128  CHOL 156  HDL 58  LDLCALC 84  TRIG 68  CHOLHDL 2.7    Thyroid  Function Tests: Recent Labs    11/22/23 0128  TSH 4.134    Anemia Panel: No results for input(s): "VITAMINB12", "FOLATE", "FERRITIN", "TIBC", "IRON", "RETICCTPCT" in the last 72 hours.  Urine analysis:    Component Value Date/Time   COLORURINE YELLOW 11/21/2023 2000   APPEARANCEUR CLEAR 11/21/2023 2000   LABSPEC 1.011 11/21/2023 2000   PHURINE 6.0 11/21/2023 2000   GLUCOSEU 50 (A) 11/21/2023 2000   HGBUR SMALL (A) 11/21/2023 2000   BILIRUBINUR NEGATIVE 11/21/2023 2000   BILIRUBINUR negative 08/29/2015 1516   KETONESUR 5 (A) 11/21/2023 2000    PROTEINUR >=300 (A) 11/21/2023 2000   UROBILINOGEN 0.2 08/29/2015 1516   UROBILINOGEN 0.2 10/12/2013 1032   NITRITE NEGATIVE 11/21/2023 2000   LEUKOCYTESUR NEGATIVE 11/21/2023 2000    Sepsis Labs: Lactic Acid, Venous    Component Value Date/Time   LATICACIDVEN 1.2 11/19/2015 2337    MICROBIOLOGY: No results found for this or any previous visit (from the past 240 hours).  RADIOLOGY STUDIES/RESULTS: VAS US  CAROTID (at [facility] and [facility] only) Result Date: 11/22/2023 Carotid Arterial Duplex Study Patient Name:  CHRYS PIEHLER  Date of Exam:   11/22/2023 Medical Rec #: [id number]     Accession #:    [id number] Date of Birth: 09-30-1945      Patient Gender: M Patient Age:   20 years Exam Location:  [facility] Procedure:      VAS US  CAROTID Referring Phys: Vada Garibaldi --------------------------------------------------------------------------------  Indications:       TIA and Onset headache. Risk Factors:      Hypertension, Diabetes. Other Factors:     H/O stroke, A-fid on Eliquis . Limitations        Today's exam was limited due to Extremely tortuous vessels. Comparison Study:  Previous study on 4.23.2017. Performing Technologist: Ria Chad  Examination Guidelines: A complete evaluation includes B-mode imaging, spectral Doppler, color Doppler, and power Doppler as needed of all accessible portions of each vessel. Bilateral testing is considered an integral part of a complete examination. Limited examinations for reoccurring indications may be performed as noted.  Right Carotid Findings: +----------+--------+--------+--------+------------------+--------+           PSV cm/sEDV cm/sStenosisPlaque DescriptionComments +----------+--------+--------+--------+------------------+--------+ CCA Prox  47      6                                          +----------+--------+--------+--------+------------------+--------+ CCA Distal49      15                                          +----------+--------+--------+--------+------------------+--------+ ICA Prox  42      18              calcific                   +----------+--------+--------+--------+------------------+--------+ ICA Mid   86      31                                         +----------+--------+--------+--------+------------------+--------+ ICA Distal52      23                                         +----------+--------+--------+--------+------------------+--------+ ECA       81                                                 +----------+--------+--------+--------+------------------+--------+ +----------+--------+-------+--------+-------------------+           PSV cm/sEDV cmsDescribeArm Pressure (mmHG) +----------+--------+-------+--------+-------------------+ Subclavian170                                        +----------+--------+-------+--------+-------------------+ +---------+--------+--+--------+--+ VertebralPSV cm/s85EDV cm/s32 +---------+--------+--+--------+--+  Left Carotid Findings: +----------+--------+--------+--------+------------------+--------+           PSV cm/sEDV cm/sStenosisPlaque DescriptionComments +----------+--------+--------+--------+------------------+--------+ CCA Prox  46      6                                          +----------+--------+--------+--------+------------------+--------+ CCA Distal39      7                                          +----------+--------+--------+--------+------------------+--------+  ICA Prox  49      17              calcific                   +----------+--------+--------+--------+------------------+--------+ ICA Mid   50      16                                         +----------+--------+--------+--------+------------------+--------+ ICA Distal47      18                                         +----------+--------+--------+--------+------------------+--------+ ECA       65      5                                           +----------+--------+--------+--------+------------------+--------+ +----------+--------+--------+--------+-------------------+           PSV cm/sEDV cm/sDescribeArm Pressure (mmHG) +----------+--------+--------+--------+-------------------+ Subclavian132                                         +----------+--------+--------+--------+-------------------+ +---------+--------+--+--------+--+ VertebralPSV cm/s45EDV cm/s13 +---------+--------+--+--------+--+   Summary: Right Carotid: Velocities in the right ICA are consistent with a 1-39% stenosis. Left Carotid: Velocities in the left ICA are consistent with a 1-39% stenosis. Vertebrals:  Bilateral vertebral arteries demonstrate antegrade flow. Subclavians: Normal flow hemodynamics were seen in bilateral subclavian              arteries. *See table(s) above for measurements and observations.  Electronically signed by Jimmye Moulds MD on 11/22/2023 at 12:08:48 PM.    Final    ECHOCARDIOGRAM COMPLETE Result Date: 11/22/2023    ECHOCARDIOGRAM REPORT   Patient Name:   LYNDELL MEADORS Date of Exam: 11/22/2023 Medical Rec #:  [id number]    Height:       67.0 in Accession #:    [id number]   Weight:       175.5 lb Date of Birth:  31-Jan-1946     BSA:          1.913 m Patient Age:    77 years     BP:           187/92 mmHg Patient Gender: M            HR:           69 bpm. Exam Location:  Inpatient Procedure: 2D Echo (Both Spectral and Color Flow Doppler were utilized during            procedure). Indications:    elevated troponin  History:        Patient has prior history of Echocardiogram examinations, most                 recent 08/03/2022. Chronic kidney disease, Arrythmias:Atrial                 Fibrillation and PVC; Risk Factors:Hypertension, Dyslipidemia,                 Diabetes and Sleep Apnea.  Sonographer:    Barbra Ley  Rosebud Confer RDCS Referring Phys: [id number] SARA-MAIZ A THOMAS IMPRESSIONS  1. Left ventricular ejection  fraction, by estimation, is 55 to 60%. The left ventricle has normal function. The left ventricle has no regional wall motion abnormalities. There is severe concentric left ventricular hypertrophy. Left ventricular diastolic  parameters are indeterminate.  2. Right ventricular systolic function is normal. The right ventricular size is normal. There is moderately elevated pulmonary artery systolic pressure. The estimated right ventricular systolic pressure is 59.8 mmHg.  3. Left atrial size was severely dilated.  4. Right atrial size was moderately dilated.  5. A small pericardial effusion is present. The pericardial effusion is posterior and lateral to the left ventricle. There is no evidence of cardiac tamponade.  6. The mitral valve is degenerative. Mild mitral valve regurgitation. No evidence of mitral stenosis. Moderate mitral annular calcification.  7. The aortic valve is tricuspid. There is mild calcification of the aortic valve. Aortic valve regurgitation is mild. No aortic stenosis is present.  8. Aortic dilatation noted. There is borderline dilatation of the aortic root, measuring 38 mm.  9. The inferior vena cava is normal in size with <50% respiratory variability, suggesting right atrial pressure of 8 mmHg. FINDINGS  Left Ventricle: Left ventricular ejection fraction, by estimation, is 55 to 60%. The left ventricle has normal function. The left ventricle has no regional wall motion abnormalities. The left ventricular internal cavity size was normal in size. There is  severe concentric left ventricular hypertrophy. Left ventricular diastolic parameters are indeterminate. Right Ventricle: The right ventricular size is normal. No increase in right ventricular wall thickness. Right ventricular systolic function is normal. There is moderately elevated pulmonary artery systolic pressure. The tricuspid regurgitant velocity is 3.70 m/s, and with an assumed right atrial pressure of 5 mmHg, the estimated right  ventricular systolic pressure is 59.8 mmHg. Left Atrium: Left atrial size was severely dilated. Right Atrium: Right atrial size was moderately dilated. Pericardium: A small pericardial effusion is present. The pericardial effusion is posterior and lateral to the left ventricle. There is no evidence of cardiac tamponade. Mitral Valve: The mitral valve is degenerative in appearance. There is mild calcification of the mitral valve leaflet(s). Moderate mitral annular calcification. Mild mitral valve regurgitation. No evidence of mitral valve stenosis. Tricuspid Valve: The tricuspid valve is normal in structure. Tricuspid valve regurgitation is trivial. No evidence of tricuspid stenosis. Aortic Valve: The aortic valve is tricuspid. There is mild calcification of the aortic valve. Aortic valve regurgitation is mild. Aortic regurgitation PHT measures 449 msec. No aortic stenosis is present. Pulmonic Valve: The pulmonic valve was normal in structure. Pulmonic valve regurgitation is trivial. No evidence of pulmonic stenosis. Aorta: Aortic dilatation noted. There is borderline dilatation of the aortic root, measuring 38 mm. Venous: The inferior vena cava is normal in size with less than 50% respiratory variability, suggesting right atrial pressure of 8 mmHg. IAS/Shunts: No atrial level shunt detected by color flow Doppler.  LEFT VENTRICLE PLAX 2D LVIDd:         4.60 cm   Diastology LVIDs:         3.10 cm   LV e' lateral: 6.42 cm/s LV PW:         1.60 cm LV IVS:        1.50 cm LVOT diam:     1.90 cm LV SV:         43 LV SV Index:   22 LVOT Area:     2.84 cm  RIGHT VENTRICLE  IVC RV S prime:     7.94 cm/s  IVC diam: 1.50 cm TAPSE (M-mode): 0.8 cm LEFT ATRIUM             Index        RIGHT ATRIUM           Index LA diam:        4.10 cm 2.14 cm/m   RA Area:     21.00 cm LA Vol (A2C):   80.0 ml 41.82 ml/m  RA Volume:   63.20 ml  33.04 ml/m LA Vol (A4C):   95.1 ml 49.71 ml/m LA Biplane Vol: 92.1 ml 48.15 ml/m   AORTIC VALVE LVOT Vmax:   77.90 cm/s LVOT Vmean:  49.900 cm/s LVOT VTI:    0.150 m AI PHT:      449 msec  AORTA Ao Root diam: 3.80 cm Ao Asc diam:  3.30 cm TRICUSPID VALVE TR Peak grad:   54.8 mmHg TR Vmax:        370.00 cm/s  SHUNTS Systemic VTI:  0.15 m Systemic Diam: 1.90 cm Jules Oar MD Electronically signed by Jules Oar MD Signature Date/Time: 11/22/2023/11:16:48 AM    Final    MR BRAIN WO CONTRAST Result Date: 11/22/2023 CLINICAL DATA:  Headache EXAM: MRI HEAD WITHOUT CONTRAST TECHNIQUE: Multiplanar, multiecho pulse sequences of the brain and surrounding structures were obtained without intravenous contrast. COMPARISON:  11/21/2015 FINDINGS: Brain: Small focus of acute ischemia in the right cerebellum. Hemosiderin deposition in the right cerebellum at the site of old infarct. Multiple chronic microhemorrhages in a predominantly peripheral distribution. Bihemispheric subarachnoid siderosis. There is multifocal hyperintense T2-weighted signal within the white matter. Generalized volume loss. Old right frontal and occipital infarcts. The midline structures are normal. Vascular: Normal flow voids. Skull and upper cervical spine: Normal calvarium and skull base. Visualized upper cervical spine and soft tissues are normal. Sinuses/Orbits:No paranasal sinus fluid levels or advanced mucosal thickening. No mastoid or middle ear effusion. Normal orbits. IMPRESSION: 1. Small focus of acute ischemia in the right cerebellum. No hemorrhage or mass effect. 2. Multiple chronic microhemorrhages in a predominantly peripheral distribution, which may indicate cerebral amyloid angiopathy. 3. Old right frontal and occipital infarcts. Electronically Signed   By: Juanetta Nordmann M.D.   On: 11/22/2023 00:34   DG Chest Port 1 View Result Date: 11/21/2023 CLINICAL DATA:  Weakness. EXAM: PORTABLE CHEST 1 VIEW COMPARISON:  Chest radiograph dated Dec 08, 2015. FINDINGS: Stable cardiomegaly. Aortic atherosclerosis. No focal  consolidation, pleural effusion, or pneumothorax. No acute osseous abnormality. IMPRESSION: 1. No acute cardiopulmonary findings. 2. Stable cardiomegaly. Electronically Signed   By: Mannie Seek M.D.   On: 11/21/2023 19:52   CT Head Wo Contrast Result Date: 11/21/2023 CLINICAL DATA:  Headache EXAM: CT HEAD WITHOUT CONTRAST TECHNIQUE: Contiguous axial images were obtained from the base of the skull through the vertex without intravenous contrast. RADIATION DOSE REDUCTION: This exam was performed according to the departmental dose-optimization program which includes automated exposure control, adjustment of the mA and/or kV according to patient size and/or use of iterative reconstruction technique. COMPARISON:  Head CT 01/30/2016. FINDINGS: Brain: No evidence of acute infarction, hemorrhage, hydrocephalus, or acute extra-axial collection or mass lesion/mass effect. Bifrontal low-density subdural fluid collections are unchanged from the prior study. Left Vascular: Atherosclerotic calcifications are present within the cavernous internal carotid arteries. Skull: Normal. Negative for fracture or focal lesion. Sinuses/Orbits: No acute finding. Other: None. IMPRESSION: 1. No acute intracranial process. 2. 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 Electronically Signed   By: Tyron Gallon M.D.   On: 11/21/2023 19:47     LOS: 2 days   Kimberly Penna, MD  [facility]    To contact the attending provider between 7A-7P or the covering provider during after hours 7P-7A, please log into the web site [email or web address] and access using universal [place] password for that web site. If you do not have the password, please call the hospital operator.  11/23/2023, 8:29 AM

## 2023-11-24 ENCOUNTER — Inpatient Hospital Stay (HOSPITAL_COMMUNITY)

## 2023-11-24 DIAGNOSIS — I63441 Cerebral infarction due to embolism of right cerebellar artery: Secondary | ICD-10-CM | POA: Diagnosis not present

## 2023-11-24 DIAGNOSIS — E785 Hyperlipidemia, unspecified: Secondary | ICD-10-CM | POA: Diagnosis not present

## 2023-11-24 DIAGNOSIS — I160 Hypertensive urgency: Secondary | ICD-10-CM | POA: Diagnosis not present

## 2023-11-24 DIAGNOSIS — I10 Essential (primary) hypertension: Secondary | ICD-10-CM | POA: Diagnosis not present

## 2023-11-24 LAB — BASIC METABOLIC PANEL WITH GFR
Anion gap: 11 (ref 5–15)
BUN: 89 mg/dL — ABNORMAL HIGH (ref 8–23)
CO2: 19 mmol/L — ABNORMAL LOW (ref 22–32)
Calcium: 7.5 mg/dL — ABNORMAL LOW (ref 8.9–10.3)
Chloride: 109 mmol/L (ref 98–111)
Creatinine, Ser: 5.19 mg/dL — ABNORMAL HIGH (ref 0.61–1.24)
GFR, Estimated: 11 mL/min — ABNORMAL LOW (ref >60)
Glucose, Bld: 87 mg/dL (ref 70–99)
Potassium: 3.4 mmol/L — ABNORMAL LOW (ref 3.5–5.1)
Sodium: 139 mmol/L (ref 135–145)

## 2023-11-24 MED ORDER — POTASSIUM CHLORIDE CRYS ER 20 MEQ PO TBCR
40.0000 meq | EXTENDED_RELEASE_TABLET | Freq: Once | ORAL | Status: AC
  Administered 2023-11-24: 40 meq via ORAL
  Filled 2023-11-24: qty 2

## 2023-11-24 MED ORDER — LOSARTAN POTASSIUM 50 MG PO TABS
50.0000 mg | ORAL_TABLET | Freq: Every day | ORAL | Status: DC
  Administered 2023-11-24: 50 mg via ORAL
  Filled 2023-11-24: qty 1

## 2023-11-24 NOTE — Plan of Care (Signed)

## 2023-11-24 NOTE — Progress Notes (Addendum)
 STROKE TEAM PROGRESS NOTE   SUBJECTIVE (INTERVAL HISTORY) No family is at the bedside.  Patient lying in bed, no acute event overnight, neuro stable.  Pending MRI.   OBJECTIVE Temp:  [98 F (36.7 C)-98.5 F (36.9 C)] 98 F (36.7 C) (04/27 1640) Pulse Rate:  [68-89] 69 (04/27 1629) Cardiac Rhythm: Atrial fibrillation (04/27 0700) Resp:  [15-20] 20 (04/27 1629) BP: (151-171)/(66-90) 155/79 (04/27 1629) SpO2:  [93 %-98 %] 96 % (04/27 1629)  No results for input(s): "GLUCAP" in the last 168 hours. Recent Labs  Lab 11/21/23 1820 11/22/23 0128 11/23/23 0936 11/24/23 0540  NA 143 144 142 139  K 3.3* 3.4* 3.4* 3.4*  CL 109 111 110 109  CO2 19* 16* 20* 19*  GLUCOSE 117* 122* 104* 87  BUN 89* 94* 93* 89*  CREATININE 5.26* 5.10* 5.38* 5.19*  CALCIUM  8.0* 8.0* 7.8* 7.5*  MG  --  1.7 1.9  --   PHOS  --  4.6  --   --    Recent Labs  Lab 11/22/23 0128  AST 18  ALT 11  ALKPHOS 79  BILITOT 1.0  PROT 6.8  ALBUMIN 3.2*   Recent Labs  Lab 11/21/23 1820 11/22/23 0128  WBC 4.6 4.3  NEUTROABS 3.3  --   HGB 10.7* 10.6*  HCT 34.7* 34.9*  MCV 85.0 85.7  PLT 153 167   No results for input(s): "CKTOTAL", "CKMB", "CKMBINDEX", "TROPONINI" in the last 168 hours. Recent Labs    11/21/23 1820  LABPROT 15.5*  INR 1.2   Recent Labs    11/21/23 2000  COLORURINE YELLOW  LABSPEC 1.011  PHURINE 6.0  GLUCOSEU 50*  HGBUR SMALL*  BILIRUBINUR NEGATIVE  KETONESUR 5*  PROTEINUR >=300*  NITRITE NEGATIVE  LEUKOCYTESUR NEGATIVE       Component Value Date/Time   CHOL 156 11/22/2023 0128   CHOL 204 (H) 12/20/2020 1639   TRIG 68 11/22/2023 0128   HDL 58 11/22/2023 0128   HDL 62 12/20/2020 1639   CHOLHDL 2.7 11/22/2023 0128   VLDL 14 11/22/2023 0128   LDLCALC 84 11/22/2023 0128   LDLCALC 86 12/21/2022 1418   Lab Results  Component Value Date   HGBA1C 5.4 11/22/2023      Component Value Date/Time   LABOPIA NONE DETECTED 11/22/2010 0831   COCAINSCRNUR NONE DETECTED  11/22/2010 0831   LABBENZ NONE DETECTED 11/22/2010 0831   AMPHETMU NONE DETECTED 11/22/2010 0831   THCU NONE DETECTED 11/22/2010 0831   LABBARB  11/22/2010 0831    NONE DETECTED        DRUG SCREEN FOR MEDICAL PURPOSES ONLY.  IF CONFIRMATION IS NEEDED FOR ANY PURPOSE, NOTIFY LAB WITHIN 5 DAYS.        LOWEST DETECTABLE LIMITS FOR URINE DRUG SCREEN Drug Class       Cutoff (ng/mL) Amphetamine      1000 Barbiturate      200 Benzodiazepine   200 Tricyclics       300 Opiates          300 Cocaine          300 THC              50    No results for input(s): "ETH" in the last 168 hours.  I have personally reviewed the radiological images below and agree with the radiology interpretations.  VAS US  CAROTID (at [facility] and [facility] only) Result Date: 11/22/2023 Carotid Arterial Duplex Study Patient Name:  Baldo Levan  Date of Exam:  11/22/2023 Medical Rec #: [id number]     Accession #:    [id number] Date of Birth: August 29, 1972      Patient Gender: M Patient Age:   78 years Exam Location:  [facility] Procedure:      VAS US  CAROTID Referring Phys: Vada Garibaldi --------------------------------------------------------------------------------  Indications:       TIA and Onset headache. Risk Factors:      Hypertension, Diabetes. Other Factors:     H/O stroke, A-fid on Eliquis . Limitations        Today's exam was limited due to Extremely tortuous vessels. Comparison Study:  Previous study on 4.23.2017. Performing Technologist: Ria Chad  Examination Guidelines: A complete evaluation includes B-mode imaging, spectral Doppler, color Doppler, and power Doppler as needed of all accessible portions of each vessel. Bilateral testing is considered an integral part of a complete examination. Limited examinations for reoccurring indications may be performed as noted.  Right Carotid Findings: +----------+--------+--------+--------+------------------+--------+           PSV cm/sEDV cm/sStenosisPlaque  DescriptionComments +----------+--------+--------+--------+------------------+--------+ CCA Prox  47      6                                          +----------+--------+--------+--------+------------------+--------+ CCA Distal49      15                                         +----------+--------+--------+--------+------------------+--------+ ICA Prox  42      18              calcific                   +----------+--------+--------+--------+------------------+--------+ ICA Mid   86      31                                         +----------+--------+--------+--------+------------------+--------+ ICA Distal52      23                                         +----------+--------+--------+--------+------------------+--------+ ECA       81                                                 +----------+--------+--------+--------+------------------+--------+ +----------+--------+-------+--------+-------------------+           PSV cm/sEDV cmsDescribeArm Pressure (mmHG) +----------+--------+-------+--------+-------------------+ Subclavian170                                        +----------+--------+-------+--------+-------------------+ +---------+--------+--+--------+--+ VertebralPSV cm/s85EDV cm/s32 +---------+--------+--+--------+--+  Left Carotid Findings: +----------+--------+--------+--------+------------------+--------+           PSV cm/sEDV cm/sStenosisPlaque DescriptionComments +----------+--------+--------+--------+------------------+--------+ CCA Prox  46      6                                          +----------+--------+--------+--------+------------------+--------+  CCA Distal39      7                                          +----------+--------+--------+--------+------------------+--------+ ICA Prox  49      17              calcific                   +----------+--------+--------+--------+------------------+--------+ ICA  Mid   50      16                                         +----------+--------+--------+--------+------------------+--------+ ICA Distal47      18                                         +----------+--------+--------+--------+------------------+--------+ ECA       65      5                                          +----------+--------+--------+--------+------------------+--------+ +----------+--------+--------+--------+-------------------+           PSV cm/sEDV cm/sDescribeArm Pressure (mmHG) +----------+--------+--------+--------+-------------------+ Subclavian132                                         +----------+--------+--------+--------+-------------------+ +---------+--------+--+--------+--+ VertebralPSV cm/s45EDV cm/s13 +---------+--------+--+--------+--+   Summary: Right Carotid: Velocities in the right ICA are consistent with a 1-39% stenosis. Left Carotid: Velocities in the left ICA are consistent with a 1-39% stenosis. Vertebrals:  Bilateral vertebral arteries demonstrate antegrade flow. Subclavians: Normal flow hemodynamics were seen in bilateral subclavian              arteries. *See table(s) above for measurements and observations.  Electronically signed by Jimmye Moulds MD on 11/22/2023 at 12:08:48 PM.    Final    ECHOCARDIOGRAM COMPLETE Result Date: 11/22/2023    ECHOCARDIOGRAM REPORT   Patient Name:   DANIELE LAFAUCI Date of Exam: 11/22/2023 Medical Rec #:  [id number]    Height:       67.0 in Accession #:    [id number]   Weight:       175.5 lb Date of Birth:  [birth year]     BSA:          1.913 m Patient Age:    77 years     BP:           187/92 mmHg Patient Gender: M            HR:           69 bpm. Exam Location:  Inpatient Procedure: 2D Echo (Both Spectral and Color Flow Doppler were utilized during            procedure). Indications:    elevated troponin  History:        Patient has prior history of Echocardiogram examinations, most                 recent  08/03/2022. Chronic  kidney disease, Arrythmias:Atrial                 Fibrillation and PVC; Risk Factors:Hypertension, Dyslipidemia,                 Diabetes and Sleep Apnea.  Sonographer:    Dione Franks RDCS Referring Phys: [id number] SARA-MAIZ A THOMAS IMPRESSIONS  1. Left ventricular ejection fraction, by estimation, is 55 to 60%. The left ventricle has normal function. The left ventricle has no regional wall motion abnormalities. There is severe concentric left ventricular hypertrophy. Left ventricular diastolic  parameters are indeterminate.  2. Right ventricular systolic function is normal. The right ventricular size is normal. There is moderately elevated pulmonary artery systolic pressure. The estimated right ventricular systolic pressure is 59.8 mmHg.  3. Left atrial size was severely dilated.  4. Right atrial size was moderately dilated.  5. A small pericardial effusion is present. The pericardial effusion is posterior and lateral to the left ventricle. There is no evidence of cardiac tamponade.  6. The mitral valve is degenerative. Mild mitral valve regurgitation. No evidence of mitral stenosis. Moderate mitral annular calcification.  7. The aortic valve is tricuspid. There is mild calcification of the aortic valve. Aortic valve regurgitation is mild. No aortic stenosis is present.  8. Aortic dilatation noted. There is borderline dilatation of the aortic root, measuring 38 mm.  9. The inferior vena cava is normal in size with <50% respiratory variability, suggesting right atrial pressure of 8 mmHg. FINDINGS  Left Ventricle: Left ventricular ejection fraction, by estimation, is 55 to 60%. The left ventricle has normal function. The left ventricle has no regional wall motion abnormalities. The left ventricular internal cavity size was normal in size. There is  severe concentric left ventricular hypertrophy. Left ventricular diastolic parameters are indeterminate. Right Ventricle: The right ventricular size  is normal. No increase in right ventricular wall thickness. Right ventricular systolic function is normal. There is moderately elevated pulmonary artery systolic pressure. The tricuspid regurgitant velocity is 3.70 m/s, and with an assumed right atrial pressure of 5 mmHg, the estimated right ventricular systolic pressure is 59.8 mmHg. Left Atrium: Left atrial size was severely dilated. Right Atrium: Right atrial size was moderately dilated. Pericardium: A small pericardial effusion is present. The pericardial effusion is posterior and lateral to the left ventricle. There is no evidence of cardiac tamponade. Mitral Valve: The mitral valve is degenerative in appearance. There is mild calcification of the mitral valve leaflet(s). Moderate mitral annular calcification. Mild mitral valve regurgitation. No evidence of mitral valve stenosis. Tricuspid Valve: The tricuspid valve is normal in structure. Tricuspid valve regurgitation is trivial. No evidence of tricuspid stenosis. Aortic Valve: The aortic valve is tricuspid. There is mild calcification of the aortic valve. Aortic valve regurgitation is mild. Aortic regurgitation PHT measures 449 msec. No aortic stenosis is present. Pulmonic Valve: The pulmonic valve was normal in structure. Pulmonic valve regurgitation is trivial. No evidence of pulmonic stenosis. Aorta: Aortic dilatation noted. There is borderline dilatation of the aortic root, measuring 38 mm. Venous: The inferior vena cava is normal in size with less than 50% respiratory variability, suggesting right atrial pressure of 8 mmHg. IAS/Shunts: No atrial level shunt detected by color flow Doppler.  LEFT VENTRICLE PLAX 2D LVIDd:         4.60 cm   Diastology LVIDs:         3.10 cm   LV e' lateral: 6.42 cm/s LV PW:  1.60 cm LV IVS:        1.50 cm LVOT diam:     1.90 cm LV SV:         43 LV SV Index:   22 LVOT Area:     2.84 cm  RIGHT VENTRICLE            IVC RV S prime:     7.94 cm/s  IVC diam: 1.50 cm TAPSE  (M-mode): 0.8 cm LEFT ATRIUM             Index        RIGHT ATRIUM           Index LA diam:        4.10 cm 2.14 cm/m   RA Area:     21.00 cm LA Vol (A2C):   80.0 ml 41.82 ml/m  RA Volume:   63.20 ml  33.04 ml/m LA Vol (A4C):   95.1 ml 49.71 ml/m LA Biplane Vol: 92.1 ml 48.15 ml/m  AORTIC VALVE LVOT Vmax:   77.90 cm/s LVOT Vmean:  49.900 cm/s LVOT VTI:    0.150 m AI PHT:      449 msec  AORTA Ao Root diam: 3.80 cm Ao Asc diam:  3.30 cm TRICUSPID VALVE TR Peak grad:   54.8 mmHg TR Vmax:        370.00 cm/s  SHUNTS Systemic VTI:  0.15 m Systemic Diam: 1.90 cm Jules Oar MD Electronically signed by Jules Oar MD Signature Date/Time: 11/22/2023/11:16:48 AM    Final    MR BRAIN WO CONTRAST Result Date: 11/22/2023 CLINICAL DATA:  Headache EXAM: MRI HEAD WITHOUT CONTRAST TECHNIQUE: Multiplanar, multiecho pulse sequences of the brain and surrounding structures were obtained without intravenous contrast. COMPARISON:  11/21/2015 FINDINGS: Brain: Small focus of acute ischemia in the right cerebellum. Hemosiderin deposition in the right cerebellum at the site of old infarct. Multiple chronic microhemorrhages in a predominantly peripheral distribution. Bihemispheric subarachnoid siderosis. There is multifocal hyperintense T2-weighted signal within the white matter. Generalized volume loss. Old right frontal and occipital infarcts. The midline structures are normal. Vascular: Normal flow voids. Skull and upper cervical spine: Normal calvarium and skull base. Visualized upper cervical spine and soft tissues are normal. Sinuses/Orbits:No paranasal sinus fluid levels or advanced mucosal thickening. No mastoid or middle ear effusion. Normal orbits. IMPRESSION: 1. Small focus of acute ischemia in the right cerebellum. No hemorrhage or mass effect. 2. Multiple chronic microhemorrhages in a predominantly peripheral distribution, which may indicate cerebral amyloid angiopathy. 3. Old right frontal and occipital infarcts.  Electronically Signed   By: Juanetta Nordmann M.D.   On: 11/22/2023 00:34   DG Chest Port 1 View Result Date: 11/21/2023 CLINICAL DATA:  Weakness. EXAM: PORTABLE CHEST 1 VIEW COMPARISON:  Chest radiograph dated Dec 08, 2015. FINDINGS: Stable cardiomegaly. Aortic atherosclerosis. No focal consolidation, pleural effusion, or pneumothorax. No acute osseous abnormality. IMPRESSION: 1. No acute cardiopulmonary findings. 2. Stable cardiomegaly. Electronically Signed   By: Mannie Seek M.D.   On: 11/21/2023 19:52   CT Head Wo Contrast Result Date: 11/21/2023 CLINICAL DATA:  Headache EXAM: CT HEAD WITHOUT CONTRAST TECHNIQUE: Contiguous axial images were obtained from the base of the skull through the vertex without intravenous contrast. RADIATION DOSE REDUCTION: This exam was performed according to the departmental dose-optimization program which includes automated exposure control, adjustment of the mA and/or kV according to patient size and/or use of iterative reconstruction technique. COMPARISON:  Head CT 01/30/2016. FINDINGS: Brain: No evidence of acute  infarction, hemorrhage, hydrocephalus, or acute extra-axial collection or mass lesion/mass effect. Bifrontal low-density subdural fluid collections are unchanged from the prior study. Left Vascular: Atherosclerotic calcifications are present within the cavernous internal carotid arteries. Skull: Normal. Negative for fracture or focal lesion. Sinuses/Orbits: No acute finding. Other: None. IMPRESSION: 1. No acute intracranial process. 2. 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 Electronically Signed   By: Tyron Gallon M.D.   On: 11/21/2023 19:47     PHYSICAL EXAM  Temp:  [98 F (36.7 C)-98.5 F (36.9 C)] 98 F (36.7 C) (04/27 1640) Pulse Rate:  [68-89] 69 (04/27 1629) Resp:  [15-20] 20 (04/27 1629) BP: (151-171)/(66-90) 155/79 (04/27 1629) SpO2:  [93 %-98 %] 96 % (04/27  1629)  General - Well nourished, well developed, in no apparent distress.  Ophthalmologic - fundi not visualized due to noncooperation.  Cardiovascular - irregularly irregular heart rate and rhythm.  Neuro - awake, alert, eyes open, orientated to age, place, year but not to month. No aphasia, paucity of speech, following all simple commands with mild psychomotor slowing. Able to name and repeat simple sentence. No gaze palsy, tracking bilaterally, visual field full. No facial droop. Tongue midline. Bilateral UEs 5/5, no drift. Bilaterally LEs 4/5, no drift. Sensation symmetrical bilaterally, b/l FTN intact, gait not tested.     ASSESSMENT/PLAN Mr. Tien Khoshaba is a 78 y.o. male with history of hypertension, diabetes, hyperlipidemia, CKD 4, CAD/MI, OSA, A-fib on Eliquis , vascular dementia, stroke admitted for imbalance and gait difficulty. No TNK given due to outside window.    Stroke:  right cerebellar infarct, likely cardioembolic due to to afib not compliant with Eliquis  vs. small vessel disease source CT no acute abnormality MRI right cerebellar small linear infarct MRA pending Carotid Doppler unremarkable 2D Echo EF 55 to 60% LDL 84 HgbA1c 6.2 Eliquis  for VTE prophylaxis Eliquis  (apixaban ) 5mg  twice daily prior to admission, now on Eliquis  2.5mg  (apixaban ) twice daily given uncontrolled BP, CKD IV and CAA.  Patient counseled to be compliant with his antithrombotic medications and he agrees that his son will help him for medication  Ongoing aggressive stroke risk factor management Therapy recommendations: Home health PT and OT Disposition: Pending  History of stroke 10/2000 left thalamic infarct 10/2015 admitted for headache, high BP, right upper and lower extremity weakness.  INR 1.97 on Coumadin .  MRI showed bilateral ACA scattered infarcts.  Carotid Doppler negative.  EF 40 to 45%.  LDL 65, A1c 8.5.  Continue on Coumadin  and pravastatin   A-fib Was on Coumadin  in the  past Currently on Eliquis  5mg  bid at home but missing several doses a week per family Now recommend Eliquis  2.5 mg twice a day given uncontrolled BP, CKD 4 and CAA. Recommend patient's son to help him with medication at home.  CAA MRI brain showed Multiple chronic microhemorrhages and hemosiderosis in a predominantly peripheral distribution, which may indicate cerebral amyloid angiopathy Recommend strict BP control with SBP less than 140 Decreased Eliquis  from 5 to 2.5 twice daily  Diabetes HgbA1c 6.2 goal < 7.0 Controlled CBG monitoring SSI DM education and close PCP follow up  Hypertension Home meds including Coreg  6.25 twice daily and hydralazine  50 mg 3 times daily Stable on the high end Continue amlodipine  10 Continue Coreg  6.25 Increase hydralazine  from 50 to 100 Add losartan  50 Long term BP goal normotensive  Hyperlipidemia Home meds: None LDL 84, goal < 70 Now on Lipitor 20 No high intensity statin  given CAA, LDL not far from goal and advanced age Continue statin at discharge  Other Stroke Risk Factors Advanced age Coronary artery disease and MI Obstructive sleep apnea, on CPAP at home  Other Active Problems Vascular dementia CKD 4, creatinine 5.26--5.10--5.19  Hospital day # 3   Consuelo Denmark, MD PhD Stroke Neurology 11/24/2023 5:31 PM    To contact Stroke Continuity provider, please refer to [email or web address]. After hours, contact General Neurology

## 2023-11-24 NOTE — Progress Notes (Signed)
 PROGRESS NOTE        PATIENT DETAILS Name: Patrick [name] Age: 78 y.o. Sex: male Date of Birth: February 03, 1946 Admit Date: 11/21/2023 Admitting Physician Vada Garibaldi, MD [facility]:[id number], Sid Dragon, NP  Brief Summary: Patient is a 78 y.o.  male with history of A-fib on Eliquis , CAD, stage IV CKD, prior CVA, vascular dementia who presented with headache, difficulty walking, presyncope-upon further evaluation-was found to have hypertensive urgency and acute right cerebellar stroke.  Significant events: 4/24>> admit to [facility]  Significant studies: 4/25>> MRI brain: Acute ischemia right cerebellum. 4/25>> carotid Doppler: 1-39% stenosis bilaterally. 4/25>> echo: EF 55-60%, moderate elevated pulmonary systolic pressure.  RVSP 59.8.  Small pericardial effusion-no tamponade. 4/25>> A1c: 5.4 4/25>> LDL: 84  Significant microbiology data: None  Procedures: None  Consults: Neurology  Subjective: No major issues-lying comfortably in bed.  Objective: Vitals: Blood pressure (!) 171/78, pulse 72, temperature 98.4 F (36.9 C), temperature source Oral, resp. rate 17, height 5\' 7"  (1.702 m), weight 79.6 kg, SpO2 93%.   Exam: Gen Exam:Alert awake-not in any distress HEENT:atraumatic, normocephalic Chest: B/L clear to auscultation anteriorly CVS:S1S2 regular Abdomen:soft non tender, non distended Extremities:no edema Neurology: Non focal Skin: no rash  Pertinent Labs/Radiology:    Latest Ref Rng & Units 11/22/2023    1:28 AM 11/21/2023    6:20 PM 10/29/2023   12:57 PM  CBC  WBC 4.0 - 10.5 K/uL 4.3  4.6    Hemoglobin 13.0 - 17.0 g/dL 32.4  40.1  02.7   Hematocrit 39.0 - 52.0 % 34.9  34.7    Platelets 150 - 400 K/uL 167  153      Lab Results  Component Value Date   NA 139 11/24/2023   K 3.4 (L) 11/24/2023   CL 109 11/24/2023   CO2 19 (L) 11/24/2023      Assessment/Plan: Acute CVA Probably embolic-secondary to poor compliance with Eliquis  due to  dementia. No focal deficits on exam Workup as above-still awaiting MRI brain Continue Eliquis  Home health services recommended by PT. Discussed with son over the phone-family will provide supervision to ensure compliance to Eliquis .  Hypertensive urgency Blood pressure reasonable this morning Continue amlodipine /Coreg /hydralazine   Chronic atrial fibrillation Rate controlled Telemetry monitoring Eliquis /Coreg   HLD On high intensity statin  AKI on CKD 4 Creatinine remains elevated-suspect this may be his baseline Needs outpatient follow-up with nephrology-thankfully currently no acute indications to start HD.  Hypokalemia Replete/recheck  Normocytic anemia Secondary to CKD 4 Follow Hb periodically.  Gout No flare Allopurinol   Vascular dementia Pleasantly confused but easily redirectable Delirium precautions.  BMI: Estimated body mass index is 27.49 kg/m as calculated from the following:   Height as of this encounter: 5\' 7"  (1.702 m).   Weight as of this encounter: 79.6 kg.   Code status:   Code Status: Full Code   DVT Prophylaxis:apixaban  (ELIQUIS ) tablet 2.5 mg Start: 11/23/23 2200 apixaban  (ELIQUIS ) tablet 2.5 mg     Family Communication: Son-[name]-[phone] updated over the phone 4/27   Disposition Plan: Status is: Inpatient Remains inpatient appropriate because: Severity of illness.   Planned Discharge Destination:Home health   Diet: Diet Order             Diet Heart Room service appropriate? Yes; Fluid consistency: Thin  Diet effective now  Antimicrobial agents: Anti-infectives (From admission, onward)    None        MEDICATIONS: Scheduled Meds:  allopurinol   50 mg Oral QODAY   amLODipine   10 mg Oral Daily   apixaban   2.5 mg Oral BID   atorvastatin   20 mg Oral Daily   carvedilol   6.25 mg Oral BID WC   hydrALAZINE   100 mg Oral TID   Continuous Infusions: PRN Meds:.acetaminophen  **OR**  acetaminophen , labetalol , levalbuterol , ondansetron  **OR** ondansetron  (ZOFRAN ) IV   I have personally reviewed following labs and imaging studies  LABORATORY DATA: CBC: Recent Labs  Lab 11/21/23 1820 11/22/23 0128  WBC 4.6 4.3  NEUTROABS 3.3  --   HGB 10.7* 10.6*  HCT 34.7* 34.9*  MCV 85.0 85.7  PLT 153 167    Basic Metabolic Panel: Recent Labs  Lab 11/21/23 1820 11/22/23 0128 11/23/23 0936 11/24/23 0540  NA 143 144 142 139  K 3.3* 3.4* 3.4* 3.4*  CL 109 111 110 109  CO2 19* 16* 20* 19*  GLUCOSE 117* 122* 104* 87  BUN 89* 94* 93* 89*  CREATININE 5.26* 5.10* 5.38* 5.19*  CALCIUM  8.0* 8.0* 7.8* 7.5*  MG  --  1.7 1.9  --   PHOS  --  4.6  --   --     GFR: Estimated Creatinine Clearance: 12.4 mL/min (A) (by C-G formula based on SCr of 5.19 mg/dL (H)).  Liver Function Tests: Recent Labs  Lab 11/22/23 0128  AST 18  ALT 11  ALKPHOS 79  BILITOT 1.0  PROT 6.8  ALBUMIN 3.2*   No results for input(s): "LIPASE", "AMYLASE" in the last 168 hours. No results for input(s): "AMMONIA" in the last 168 hours.  Coagulation Profile: Recent Labs  Lab 11/21/23 1820  INR 1.2    Cardiac Enzymes: No results for input(s): "CKTOTAL", "CKMB", "CKMBINDEX", "TROPONINI" in the last 168 hours.  BNP (last 3 results) No results for input(s): "PROBNP" in the last 8760 hours.  Lipid Profile: Recent Labs    11/22/23 0128  CHOL 156  HDL 58  LDLCALC 84  TRIG 68  CHOLHDL 2.7    Thyroid  Function Tests: Recent Labs    11/22/23 0128  TSH 4.134    Anemia Panel: No results for input(s): "VITAMINB12", "FOLATE", "FERRITIN", "TIBC", "IRON", "RETICCTPCT" in the last 72 hours.  Urine analysis:    Component Value Date/Time   COLORURINE YELLOW 11/21/2023 2000   APPEARANCEUR CLEAR 11/21/2023 2000   LABSPEC 1.011 11/21/2023 2000   PHURINE 6.0 11/21/2023 2000   GLUCOSEU 50 (A) 11/21/2023 2000   HGBUR SMALL (A) 11/21/2023 2000   BILIRUBINUR NEGATIVE 11/21/2023 2000    BILIRUBINUR negative 08/29/2015 1516   KETONESUR 5 (A) 11/21/2023 2000   PROTEINUR >=300 (A) 11/21/2023 2000   UROBILINOGEN 0.2 08/29/2015 1516   UROBILINOGEN 0.2 10/12/2013 1032   NITRITE NEGATIVE 11/21/2023 2000   LEUKOCYTESUR NEGATIVE 11/21/2023 2000    Sepsis Labs: Lactic Acid, Venous    Component Value Date/Time   LATICACIDVEN 1.2 11/19/2015 2337    MICROBIOLOGY: No results found for this or any previous visit (from the past 240 hours).  RADIOLOGY STUDIES/RESULTS: ECHOCARDIOGRAM COMPLETE Result Date: 11/22/2023    ECHOCARDIOGRAM REPORT   Patient Name:   Patrick [name] Date of Exam: 11/22/2023 Medical Rec #:  [id number]    Height:       67.0 in Accession #:    [id number]   Weight:       175.5 lb Date of Birth:  [birth year]  BSA:          1.913 m Patient Age:    77 years     BP:           187/92 mmHg Patient Gender: M            HR:           69 bpm. Exam Location:  Inpatient Procedure: 2D Echo (Both Spectral and Color Flow Doppler were utilized during            procedure). Indications:    elevated troponin  History:        Patient has prior history of Echocardiogram examinations, most                 recent 08/03/2022. Chronic kidney disease, Arrythmias:Atrial                 Fibrillation and PVC; Risk Factors:Hypertension, Dyslipidemia,                 Diabetes and Sleep Apnea.  Sonographer:    Dione Franks RDCS Referring Phys: [id number] SARA-MAIZ A THOMAS IMPRESSIONS  1. Left ventricular ejection fraction, by estimation, is 55 to 60%. The left ventricle has normal function. The left ventricle has no regional wall motion abnormalities. There is severe concentric left ventricular hypertrophy. Left ventricular diastolic  parameters are indeterminate.  2. Right ventricular systolic function is normal. The right ventricular size is normal. There is moderately elevated pulmonary artery systolic pressure. The estimated right ventricular systolic pressure is 59.8 mmHg.  3. Left atrial size was  severely dilated.  4. Right atrial size was moderately dilated.  5. A small pericardial effusion is present. The pericardial effusion is posterior and lateral to the left ventricle. There is no evidence of cardiac tamponade.  6. The mitral valve is degenerative. Mild mitral valve regurgitation. No evidence of mitral stenosis. Moderate mitral annular calcification.  7. The aortic valve is tricuspid. There is mild calcification of the aortic valve. Aortic valve regurgitation is mild. No aortic stenosis is present.  8. Aortic dilatation noted. There is borderline dilatation of the aortic root, measuring 38 mm.  9. The inferior vena cava is normal in size with <50% respiratory variability, suggesting right atrial pressure of 8 mmHg. FINDINGS  Left Ventricle: Left ventricular ejection fraction, by estimation, is 55 to 60%. The left ventricle has normal function. The left ventricle has no regional wall motion abnormalities. The left ventricular internal cavity size was normal in size. There is  severe concentric left ventricular hypertrophy. Left ventricular diastolic parameters are indeterminate. Right Ventricle: The right ventricular size is normal. No increase in right ventricular wall thickness. Right ventricular systolic function is normal. There is moderately elevated pulmonary artery systolic pressure. The tricuspid regurgitant velocity is 3.70 m/s, and with an assumed right atrial pressure of 5 mmHg, the estimated right ventricular systolic pressure is 59.8 mmHg. Left Atrium: Left atrial size was severely dilated. Right Atrium: Right atrial size was moderately dilated. Pericardium: A small pericardial effusion is present. The pericardial effusion is posterior and lateral to the left ventricle. There is no evidence of cardiac tamponade. Mitral Valve: The mitral valve is degenerative in appearance. There is mild calcification of the mitral valve leaflet(s). Moderate mitral annular calcification. Mild mitral valve  regurgitation. No evidence of mitral valve stenosis. Tricuspid Valve: The tricuspid valve is normal in structure. Tricuspid valve regurgitation is trivial. No evidence of tricuspid stenosis. Aortic Valve: The aortic valve is tricuspid. There is  mild calcification of the aortic valve. Aortic valve regurgitation is mild. Aortic regurgitation PHT measures 449 msec. No aortic stenosis is present. Pulmonic Valve: The pulmonic valve was normal in structure. Pulmonic valve regurgitation is trivial. No evidence of pulmonic stenosis. Aorta: Aortic dilatation noted. There is borderline dilatation of the aortic root, measuring 38 mm. Venous: The inferior vena cava is normal in size with less than 50% respiratory variability, suggesting right atrial pressure of 8 mmHg. IAS/Shunts: No atrial level shunt detected by color flow Doppler.  LEFT VENTRICLE PLAX 2D LVIDd:         4.60 cm   Diastology LVIDs:         3.10 cm   LV e' lateral: 6.42 cm/s LV PW:         1.60 cm LV IVS:        1.50 cm LVOT diam:     1.90 cm LV SV:         43 LV SV Index:   22 LVOT Area:     2.84 cm  RIGHT VENTRICLE            IVC RV S prime:     7.94 cm/s  IVC diam: 1.50 cm TAPSE (M-mode): 0.8 cm LEFT ATRIUM             Index        RIGHT ATRIUM           Index LA diam:        4.10 cm 2.14 cm/m   RA Area:     21.00 cm LA Vol (A2C):   80.0 ml 41.82 ml/m  RA Volume:   63.20 ml  33.04 ml/m LA Vol (A4C):   95.1 ml 49.71 ml/m LA Biplane Vol: 92.1 ml 48.15 ml/m  AORTIC VALVE LVOT Vmax:   77.90 cm/s LVOT Vmean:  49.900 cm/s LVOT VTI:    0.150 m AI PHT:      449 msec  AORTA Ao Root diam: 3.80 cm Ao Asc diam:  3.30 cm TRICUSPID VALVE TR Peak grad:   54.8 mmHg TR Vmax:        370.00 cm/s  SHUNTS Systemic VTI:  0.15 m Systemic Diam: 1.90 cm Jules Oar MD Electronically signed by Jules Oar MD Signature Date/Time: 11/22/2023/11:16:48 AM    Final      LOS: 3 days   Kimberly Penna, MD  [facility]    To contact the attending provider  between 7A-7P or the covering provider during after hours 7P-7A, please log into the web site [email or web address] and access using universal [place] password for that web site. If you do not have the password, please call the hospital operator.  11/24/2023, 10:29 AM

## 2023-11-25 ENCOUNTER — Ambulatory Visit: Payer: Self-pay

## 2023-11-25 ENCOUNTER — Other Ambulatory Visit (HOSPITAL_COMMUNITY): Payer: Self-pay

## 2023-11-25 ENCOUNTER — Encounter (HOSPITAL_COMMUNITY): Payer: Self-pay

## 2023-11-25 DIAGNOSIS — I4821 Permanent atrial fibrillation: Secondary | ICD-10-CM

## 2023-11-25 DIAGNOSIS — I160 Hypertensive urgency: Secondary | ICD-10-CM | POA: Diagnosis not present

## 2023-11-25 DIAGNOSIS — N184 Chronic kidney disease, stage 4 (severe): Secondary | ICD-10-CM

## 2023-11-25 DIAGNOSIS — I63441 Cerebral infarction due to embolism of right cerebellar artery: Secondary | ICD-10-CM | POA: Diagnosis not present

## 2023-11-25 LAB — BASIC METABOLIC PANEL WITH GFR
Anion gap: 11 (ref 5–15)
BUN: 84 mg/dL — ABNORMAL HIGH (ref 8–23)
CO2: 18 mmol/L — ABNORMAL LOW (ref 22–32)
Calcium: 7.6 mg/dL — ABNORMAL LOW (ref 8.9–10.3)
Chloride: 107 mmol/L (ref 98–111)
Creatinine, Ser: 5.38 mg/dL — ABNORMAL HIGH (ref 0.61–1.24)
GFR, Estimated: 10 mL/min — ABNORMAL LOW (ref >60)
Glucose, Bld: 96 mg/dL (ref 70–99)
Potassium: 4.4 mmol/L (ref 3.5–5.1)
Sodium: 136 mmol/L (ref 135–145)

## 2023-11-25 LAB — CBC
HCT: 29.4 % — ABNORMAL LOW (ref 39.0–52.0)
Hemoglobin: 9.1 g/dL — ABNORMAL LOW (ref 13.0–17.0)
MCH: 26 pg (ref 26.0–34.0)
MCHC: 31 g/dL (ref 30.0–36.0)
MCV: 84 fL (ref 80.0–100.0)
Platelets: 158 10*3/uL (ref 150–400)
RBC: 3.5 MIL/uL — ABNORMAL LOW (ref 4.22–5.81)
RDW: 15.5 % (ref 11.5–15.5)
WBC: 4.5 10*3/uL (ref 4.0–10.5)
nRBC: 0 % (ref 0.0–0.2)

## 2023-11-25 MED ORDER — ISOSORBIDE MONONITRATE ER 30 MG PO TB24
30.0000 mg | ORAL_TABLET | Freq: Every day | ORAL | 1 refills | Status: DC
  Filled 2023-11-25 – 2023-12-25 (×2): qty 30, 30d supply, fill #0

## 2023-11-25 MED ORDER — CARVEDILOL 12.5 MG PO TABS
12.5000 mg | ORAL_TABLET | Freq: Two times a day (BID) | ORAL | Status: DC
  Administered 2023-11-25: 12.5 mg via ORAL
  Filled 2023-11-25: qty 1

## 2023-11-25 MED ORDER — CARVEDILOL 12.5 MG PO TABS
12.5000 mg | ORAL_TABLET | Freq: Two times a day (BID) | ORAL | 1 refills | Status: DC
Start: 2023-11-25 — End: 2024-02-03
  Filled 2023-11-25 – 2023-12-25 (×2): qty 60, 30d supply, fill #0

## 2023-11-25 MED ORDER — ATORVASTATIN CALCIUM 20 MG PO TABS
20.0000 mg | ORAL_TABLET | Freq: Every day | ORAL | 1 refills | Status: AC
Start: 2023-11-25 — End: ?
  Filled 2023-11-25 – 2023-12-25 (×2): qty 30, 30d supply, fill #0

## 2023-11-25 MED ORDER — ISOSORBIDE MONONITRATE ER 30 MG PO TB24
30.0000 mg | ORAL_TABLET | Freq: Every day | ORAL | Status: DC
  Administered 2023-11-25: 30 mg via ORAL
  Filled 2023-11-25: qty 1

## 2023-11-25 MED ORDER — APIXABAN 2.5 MG PO TABS
2.5000 mg | ORAL_TABLET | Freq: Two times a day (BID) | ORAL | 1 refills | Status: DC
Start: 2023-11-25 — End: 2024-01-27
  Filled 2023-11-25 – 2023-12-25 (×2): qty 60, 30d supply, fill #0

## 2023-11-25 MED ORDER — HYDRALAZINE HCL 100 MG PO TABS
100.0000 mg | ORAL_TABLET | Freq: Three times a day (TID) | ORAL | 1 refills | Status: DC
  Filled 2023-11-25 – 2023-12-25 (×2): qty 90, 30d supply, fill #0

## 2023-11-25 NOTE — Telephone Encounter (Signed)
 Copied from CRM [id number]. Topic: Clinical - Medical Advice >> Nov 25, 2023  2:54 PM Danelle Dunning F wrote: Reason for CRM:   Patient's sons, Carolyn Cisco & Larinda Plover called in to schedule the patient for an appointment to discuss the need for a Medicare Pre-Claim Review. The patient was scheduled for a in-office hospital follow up visit due to recent ED visit. The main concern for getting the patient home health care is to assist with possible dementia / altered mental state.   Please call either son with any questions or concerns prior to the patient's appointment tomorrow. Either son would like to receive a call back to inform them on any thing additional they may need to do to prevent any delay on getting the Medicare Pre- Claim Review submitted.  Larinda Plover : [phone]  Jamie: [phone]

## 2023-11-25 NOTE — Progress Notes (Signed)
 Physical Therapy Treatment Patient Details Name: Patrick [name] MRN: [id number] DOB: 05-Jun-1946 Today's Date: 11/25/2023   History of Present Illness Patient is a 78 y.o.  male admitted 4/24 with headache, difficulty walking, presyncope-upon further evaluation-was found to have hypertensive urgency and acute right cerebellar stroke.  PMHx: A-fib on Eliquis , CAD, stage IV CKD, prior CVA, vascular dementia.    PT Comments  Tolerated treatment well, although fatigues with gait (VSS). Managed to navigate further distance today up to  95 feet. Did not make it to stair-well to practice stair navigation - recommend family assist at home 24/7 for safety. Oriented to month, location, and self. Picks correct year when provided 3 options; still unsure of his diagnosis. Educated on findings, safety with mobility and need for RW to mobilize at home. Pt agreeable. Patient will continue to benefit from skilled physical therapy services to further improve independence with functional mobility.    If plan is discharge home, recommend the following: A little help with walking and/or transfers;A little help with bathing/dressing/bathroom;Assistance with cooking/housework;Direct supervision/assist for medications management;Direct supervision/assist for financial management;Assist for transportation;Help with stairs or ramp for entrance;Supervision due to cognitive status   Can travel by private vehicle        Equipment Recommendations  None recommended by PT    Recommendations for Other Services       Precautions / Restrictions Precautions Precautions: Fall Recall of Precautions/Restrictions: Impaired Restrictions Weight Bearing Restrictions Per Provider Order: No     Mobility  Bed Mobility Overal bed mobility: Needs Assistance Bed Mobility: Supine to Sit     Supine to sit: Supervision     General bed mobility comments: Supervision for safety, slower and effortful without assist.     Transfers Overall transfer level: Needs assistance Equipment used: Rolling walker (2 wheels) Transfers: Sit to/from Stand Sit to Stand: Supervision           General transfer comment: Supervision for safety, slow effortful, good recall of hand placement. RW to stabilize upon rising from bed x2.    Ambulation/Gait Ambulation/Gait assistance: Supervision Gait Distance (Feet): 95 Feet Assistive device: Rolling walker (2 wheels) Gait Pattern/deviations: Step-through pattern, Decreased stride length, Trunk flexed Gait velocity: dec Gait velocity interpretation: <1.31 ft/sec, indicative of household ambulator   General Gait Details: Supervision for safety, adequately stabilized with RW for support. fatigues easily, declines further distance than that covered today. SpO2 95% and greater on RA. No dyspnea noted. No overt LOB or buckling.   Stairs             Wheelchair Mobility     Tilt Bed    Modified Rankin (Stroke Patients Only) Modified Rankin (Stroke Patients Only) Pre-Morbid Rankin Score: No significant disability Modified Rankin: Moderately severe disability     Balance Overall balance assessment: Needs assistance Sitting-balance support: No upper extremity supported, Feet supported Sitting balance-Leahy Scale: Good     Standing balance support: No upper extremity supported Standing balance-Leahy Scale: Fair                              Hotel manager: No apparent difficulties  Cognition Arousal: Alert Behavior During Therapy: Flat affect   PT - Cognitive impairments: No family/caregiver present to determine baseline, Memory, Problem solving, Awareness, Orientation, Attention   Orientation impairments: Situation, Time                   PT - Cognition Comments: Unsure of  year initially but able to pick correctly out of 3 options. Unable to count backwards by 2s from 20 while walking. Following commands:  Impaired Following commands impaired: Only follows one step commands consistently, Follows one step commands with increased time    Cueing Cueing Techniques: Verbal cues  Exercises      General Comments General comments (skin integrity, edema, etc.): VSS      Pertinent Vitals/Pain Pain Assessment Pain Assessment: No/denies pain    Home Living     Available Help at Discharge: Family;Available 24 hours/day Type of Home: House                  Prior Function            PT Goals (current goals can now be found in the care plan section) Acute Rehab PT Goals Patient Stated Goal: get well, return home, regain strength PT Goal Formulation: With patient Time For Goal Achievement: 12/06/23 Potential to Achieve Goals: Good Progress towards PT goals: Progressing toward goals    Frequency    Min 2X/week      PT Plan      Co-evaluation              AM-PAC PT "6 Clicks" Mobility   Outcome Measure  Help needed turning from your back to your side while in a flat bed without using bedrails?: None Help needed moving from lying on your back to sitting on the side of a flat bed without using bedrails?: None Help needed moving to and from a bed to a chair (including a wheelchair)?: A Little Help needed standing up from a chair using your arms (e.g., wheelchair or bedside chair)?: A Little Help needed to walk in hospital room?: A Little Help needed climbing 3-5 steps with a railing? : A Little 6 Click Score: 20    End of Session Equipment Utilized During Treatment: Gait belt Activity Tolerance: Patient tolerated treatment well Patient left: in chair;with call bell/phone within reach;with chair alarm set   PT Visit Diagnosis: Unsteadiness on feet (R26.81);Other abnormalities of gait and mobility (R26.89);Muscle weakness (generalized) (M62.81);Other symptoms and signs involving the nervous system (R29.898)     Time: 4098-1191 PT Time Calculation (min) (ACUTE  ONLY): 13 min  Charges:    $Gait Training: 8-22 mins PT General Charges $$ ACUTE PT VISIT: 1 Visit                     Jory Ng, PT, DPT [facility]  Rehabilitation Services Physical Therapist Office: [phone] Website: [email or web address]    Alinda Irani 11/25/2023, 11:13 AM

## 2023-11-25 NOTE — Plan of Care (Signed)

## 2023-11-25 NOTE — Discharge Summary (Signed)
 PATIENT DETAILS Name: Patrick [name] Age: 78 y.o. Sex: male Date of Birth: 1945/12/28 MRN: [id number]. Admitting Physician: Vada Garibaldi, MD [id number], [name], NP  Admit Date: 11/21/2023 Discharge date: 11/25/2023  Recommendations for Outpatient Follow-up:  Follow up with PCP in 1-2 weeks Please obtain CMP/CBC in one week Please follow up with the stroke clinic, nephrology.   Repeat 2D echocardiogram in 4-6 weeks-see below.  Admitted From:  Home  Disposition: Home health   Discharge Condition: good  CODE STATUS:   Code Status: Full Code   Diet recommendation:  Diet Order             Diet - low sodium heart healthy           Diet Heart Room service appropriate? Yes; Fluid consistency: Thin  Diet effective now                    Brief Summary: Patient is a 78 y.o.  male with history of A-fib on Eliquis , CAD, stage IV CKD, prior CVA, vascular dementia who presented with headache, difficulty walking, presyncope-upon further evaluation-was found to have hypertensive urgency and acute right cerebellar stroke.   Significant events: 4/24>> admit to [facility]   Significant studies: 4/25>> MRI brain: Acute ischemia right cerebellum. 4/25>> carotid Doppler: 1-39% stenosis bilaterally. 4/25>> echo: EF 55-60%, moderate elevated pulmonary systolic pressure.  RVSP 59.8.  Small pericardial effusion-no tamponade. 4/25>> A1c: 5.4 4/25>> LDL: 84 4/27>> MRI brain: High-grade stenosis distal right V4 segment, moderate to high-grade stenosis of proximal left M2, moderate to high-grade stenosis of the distal basilar artery, moderate stenosis left P2, moderate stenosis of A2 bilaterally, moderate stenosis of proximal right pericallosal artery.   Significant microbiology data: None   Procedures: None   Consults: Neurology  Brief Hospital Course: Acute CVA Probably embolic-secondary to poor compliance with Eliquis  due to dementia. Although he does have significant  intracranial atherosclerotic disease No focal deficits on exam Neurology discussed with pharmacy-Eliquis  dosage has been reduced to 2.5 mg Rest of the workup as above Discussed with sons on-4/27-over the phone family will ensure compliance with Eliquis  with adequate supervision. Home health services recommend by physical therapy Discussed with stroke team-4/28-no further recommendations-okay to discharge.  Cerebral amyloid angiopathy Per stroke MD-MRI brain with multiple chronic microhemorrhages/hemosiderosis-consistent with cerebral amyloid angiopathy Due to increased risk of bleeding-neurology discussed with pharmacy-dose of Eliquis  reduced to 2.5 mg twice daily.   Hypertensive urgency Still on the higher side Increase Coreg  to 12.5 mg Add Imdur Stop losartan  given advanced CKD. Continue amlodipine /hydralazine  Follow and optimize.   Chronic atrial fibrillation Rate controlled Eliquis /Coreg  See above regarding Eliquis .  Pericardial effusion Incidental finding on echo-small-no tamponade No clinical features consistent with pericarditis as well-no chest pain.  Repeat echocardiogram in the next 6 weeks to ensure stability/resolution.   HLD On high intensity statin   AKI on CKD 4 Mild elevation of creatinine more than usual baseline-managed with supportive care-he follows with Dr. Thomasina Fletcher is currently essentially asymptomatic-electrolytes are stable-volume status is stable-suspect he probably will need initiation of HD in the near future.  Thankfully no current emergent indications to start HD as an inpatient Discussed with sons on 4/27-family arrange outpatient follow-up with Dr. Kruska in the near future. Continue to avoid nephrotoxic agents.   Hypokalemia Repleted   Normocytic anemia Secondary to CKD 4 Follow Hb periodically.   Gout No flare Allopurinol    Vascular dementia Pleasantly confused but easily redirectable Delirium precautions.   BMI: Estimated body  mass index is 27.49 kg/m as calculated from the following:   Height as of this encounter: 5\' 7"  (1.702 m).   Weight as of this encounter: 79.6 kg.   Discharge Diagnoses:  Principal Problem:   Stroke (cerebrum) [facility]) Active Problems:   Hypertensive emergency   Discharge Instructions:  Activity:  As tolerated with Full fall precautions use walker/cane & assistance as needed   Discharge Instructions     Ambulatory referral to Neurology   Complete by: As directed    An appointment is requested in approximately: 8 weeks   Call MD for:  extreme fatigue   Complete by: As directed    Call MD for:  persistant dizziness or light-headedness   Complete by: As directed    Diet - low sodium heart healthy   Complete by: As directed    Discharge instructions   Complete by: As directed    Follow with Primary MD  [name], [name], NP in 1-2 weeks  Please ensure follow-up with infectious disease and nephrology.  Please get a complete blood count and chemistry panel checked by your Primary MD at your next visit, and again as instructed by your Primary MD.  Get Medicines reviewed and adjusted: Please take all your medications with you for your next visit with your Primary MD  Laboratory/radiological data: Please request your Primary MD to go over all hospital tests and procedure/radiological results at the follow up, please ask your Primary MD to get all Hospital records sent to his/her office.  In some cases, they will be blood work, cultures and biopsy results pending at the time of your discharge. Please request that your primary care M.D. follows up on these results.  Also Note the following: If you experience worsening of your admission symptoms, develop shortness of breath, life threatening emergency, suicidal or homicidal thoughts you must seek medical attention immediately by calling 911 or calling your MD immediately  if symptoms less severe.  You must read complete  instructions/literature along with all the possible adverse reactions/side effects for all the Medicines you take and that have been prescribed to you. Take any new Medicines after you have completely understood and accpet all the possible adverse reactions/side effects.   Do not drive when taking Pain medications or sleeping medications (Benzodaizepines)  Do not take more than prescribed Pain, Sleep and Anxiety Medications. It is not advisable to combine anxiety,sleep and pain medications without talking with your primary care practitioner  Special Instructions: If you have smoked or chewed Tobacco  in the last 2 yrs please stop smoking, stop any regular Alcohol   and or any Recreational drug use.  Wear Seat belts while driving.  Please note: You were cared for by a hospitalist during your hospital stay. Once you are discharged, your primary care physician will handle any further medical issues. Please note that NO REFILLS for any discharge medications will be authorized once you are discharged, as it is imperative that you return to your primary care physician (or establish a relationship with a primary care physician if you do not have one) for your post hospital discharge needs so that they can reassess your need for medications and monitor your lab values.   Increase activity slowly   Complete by: As directed       Allergies as of 11/25/2023   No Known Allergies      Medication List     TAKE these medications    allopurinol  100 MG tablet Commonly known as: Zyloprim   Take 0.5 tablets (50 mg total) by mouth every other day.   amLODipine  10 MG tablet Commonly known as: NORVASC  TAKE ONE TABLET BY MOUTH ONE TIME DAILY   apixaban  2.5 MG Tabs tablet Commonly known as: ELIQUIS  Take 1 tablet (2.5 mg total) by mouth 2 (two) times daily. What changed:  medication strength how much to take   atorvastatin  20 MG tablet Commonly known as: LIPITOR Take 1 tablet (20 mg total) by mouth  daily.   calcitRIOL 0.25 MCG capsule Commonly known as: ROCALTROL Take 0.25 mcg by mouth daily.   carvedilol  12.5 MG tablet Commonly known as: COREG  Take 1 tablet (12.5 mg total) by mouth 2 (two) times daily with a meal. What changed:  medication strength how much to take   hydrALAZINE  100 MG tablet Commonly known as: APRESOLINE  Take 1 tablet (100 mg total) by mouth 3 (three) times daily. What changed:  medication strength how much to take   isosorbide mononitrate 30 MG 24 hr tablet Commonly known as: IMDUR Take 1 tablet (30 mg total) by mouth daily.               Durable Medical Equipment  (From admission, onward)           Start     Ordered   11/23/23 1154  For home use only DME Walker rolling  Once       Question Answer Comment  Walker: With 5 Inch Wheels   Patient needs a walker to treat with the following condition Weakness      11/23/23 1154            Follow-up Information     [facility] Follow up.   Contact information: [address] [place] [address] [phone]         [name], Sid Dragon, NP. Schedule an appointment as soon as possible for a visit in 1 week(s).   Specialty: Internal Medicine Contact information: [address] [place] [address] [phone]         Baron Border, MD. Schedule an appointment as soon as possible for a visit in 1 week(s).   Specialties: Internal Medicine, Vascular Surgery Contact information: [address] [place] [address] [phone]         [facility] Follow up.   Specialty: Neurology Why: Office will call with date/time, If you dont hear from them,please give them a call Contact information: [address] [place]  [phone]               No Known Allergies   Other Procedures/Studies: MR ANGIO HEAD WO CONTRAST Result Date: 11/24/2023 CLINICAL DATA:   Stroke, follow-up. Acute lacunar infarct of the right cerebellum. EXAM: MRA HEAD WITHOUT CONTRAST TECHNIQUE: Angiographic images of the Circle of Willis were acquired using MRA technique without intravenous contrast. COMPARISON:  MR head without contrast 11/22/2023 FINDINGS: Anterior circulation: High cervical segments are within normal limits bilaterally. 50% stenosis is present within the left ophthalmic segment. Moderate high-grade focal stenosis is present in the supraclinoid right ICA. The ICA termini are within limits bilaterally. The right A1 segment is dominant. Mild narrowing is present the distal left M1 segment. Moderate to high-grade stenoses are present in the proximal posterior left M2 segments. More distal segmental narrowing is present in the right MCA branch vessels. Moderate segmental stenoses are present in the A2 segments bilaterally. Moderate stenosis is present in the  proximal right pericallosal artery. Posterior circulation: The to left vertebral artery dominant. High-grade stenoses or occlusion are present in the distal right V4 segment. Proximal basilar artery is somewhat irregular. Moderate to high-grade stenosis is present in the more distal basilar artery just below the superior cerebellar arteries. The superior cerebellar arteries are patent bilaterally. Moderate stenosis is present in the distal left P2 segment. Right posterior cerebral artery is of fetal type. Moderate stenosis is present in the proximal right P2 segment. Mild attenuation of distal PCA branch vessels is present bilaterally. Anatomic variants: Fetal type right posterior cerebral artery. Other: None. IMPRESSION: 1. High-grade stenoses or occlusion in the distal right V4 segment. 2. Moderate to high-grade stenoses in the proximal posterior left M2 segments. 3. Moderate to high-grade stenosis in the more distal basilar artery just below the superior cerebellar arteries. 4. Moderate stenosis in the distal left P2 segment.  5. Moderate stenosis in the proximal right P2 segment. 6. Moderate segmental stenoses in the A2 segments bilaterally. 7. Moderate stenosis in the proximal right pericallosal artery. 8. 50% stenosis in the left ophthalmic segment. 9. Moderate high-grade focal stenosis in the supraclinoid right ICA. Electronically Signed   By: Audree Leas M.D.   On: 11/24/2023 18:41   VAS US  CAROTID (at [facility] and [facility] only) Result Date: 11/22/2023 Carotid Arterial Duplex Study Patient Name:  Patrick [name]  Date of Exam:   11/22/2023 Medical Rec #: [id number]     Accession #:    [id number] Date of Birth: Mar 03, 1946      Patient Gender: M Patient Age:   53 years Exam Location:  [facility] Procedure:      VAS US  CAROTID Referring Phys: Vada Garibaldi --------------------------------------------------------------------------------  Indications:       TIA and Onset headache. Risk Factors:      Hypertension, Diabetes. Other Factors:     H/O stroke, A-fid on Eliquis . Limitations        Today's exam was limited due to Extremely tortuous vessels. Comparison Study:  Previous study on 4.23.2017. Performing Technologist: Ria Chad  Examination Guidelines: A complete evaluation includes B-mode imaging, spectral Doppler, color Doppler, and power Doppler as needed of all accessible portions of each vessel. Bilateral testing is considered an integral part of a complete examination. Limited examinations for reoccurring indications may be performed as noted.  Right Carotid Findings: +----------+--------+--------+--------+------------------+--------+           PSV cm/sEDV cm/sStenosisPlaque DescriptionComments +----------+--------+--------+--------+------------------+--------+ CCA Prox  47      6                                          +----------+--------+--------+--------+------------------+--------+ CCA Distal49      15                                          +----------+--------+--------+--------+------------------+--------+ ICA Prox  42      18              calcific                   +----------+--------+--------+--------+------------------+--------+ ICA Mid   86      31                                         +----------+--------+--------+--------+------------------+--------+  ICA Distal52      23                                         +----------+--------+--------+--------+------------------+--------+ ECA       81                                                 +----------+--------+--------+--------+------------------+--------+ +----------+--------+-------+--------+-------------------+           PSV cm/sEDV cmsDescribeArm Pressure (mmHG) +----------+--------+-------+--------+-------------------+ Subclavian170                                        +----------+--------+-------+--------+-------------------+ +---------+--------+--+--------+--+ VertebralPSV cm/s85EDV cm/s32 +---------+--------+--+--------+--+  Left Carotid Findings: +----------+--------+--------+--------+------------------+--------+           PSV cm/sEDV cm/sStenosisPlaque DescriptionComments +----------+--------+--------+--------+------------------+--------+ CCA Prox  46      6                                          +----------+--------+--------+--------+------------------+--------+ CCA Distal39      7                                          +----------+--------+--------+--------+------------------+--------+ ICA Prox  49      17              calcific                   +----------+--------+--------+--------+------------------+--------+ ICA Mid   50      16                                         +----------+--------+--------+--------+------------------+--------+ ICA Distal47      18                                         +----------+--------+--------+--------+------------------+--------+ ECA       65      5                                           +----------+--------+--------+--------+------------------+--------+ +----------+--------+--------+--------+-------------------+           PSV cm/sEDV cm/sDescribeArm Pressure (mmHG) +----------+--------+--------+--------+-------------------+ Subclavian132                                         +----------+--------+--------+--------+-------------------+ +---------+--------+--+--------+--+ VertebralPSV cm/s45EDV cm/s13 +---------+--------+--+--------+--+   Summary: Right Carotid: Velocities in the right ICA are consistent with a 1-39% stenosis. Left Carotid: Velocities in the left ICA are consistent with a 1-39% stenosis. Vertebrals:  Bilateral vertebral arteries demonstrate antegrade flow. Subclavians: Normal flow hemodynamics were seen in  bilateral subclavian              arteries. *See table(s) above for measurements and observations.  Electronically signed by Jimmye Moulds MD on 11/22/2023 at 12:08:48 PM.    Final    ECHOCARDIOGRAM COMPLETE Result Date: 11/22/2023    ECHOCARDIOGRAM REPORT   Patient Name:   Patrick [name] Date of Exam: 11/22/2023 Medical Rec #:  [id number]    Height:       67.0 in Accession #:    [id number]   Weight:       175.5 lb Date of Birth:  August 26, 1945     BSA:          1.913 m Patient Age:    77 years     BP:           187/92 mmHg Patient Gender: M            HR:           69 bpm. Exam Location:  Inpatient Procedure: 2D Echo (Both Spectral and Color Flow Doppler were utilized during            procedure). Indications:    elevated troponin  History:        Patient has prior history of Echocardiogram examinations, most                 recent 08/03/2022. Chronic kidney disease, Arrythmias:Atrial                 Fibrillation and PVC; Risk Factors:Hypertension, Dyslipidemia,                 Diabetes and Sleep Apnea.  Sonographer:    Dione Franks RDCS Referring Phys: [id number] SARA-MAIZ A THOMAS IMPRESSIONS  1. Left ventricular ejection  fraction, by estimation, is 55 to 60%. The left ventricle has normal function. The left ventricle has no regional wall motion abnormalities. There is severe concentric left ventricular hypertrophy. Left ventricular diastolic  parameters are indeterminate.  2. Right ventricular systolic function is normal. The right ventricular size is normal. There is moderately elevated pulmonary artery systolic pressure. The estimated right ventricular systolic pressure is 59.8 mmHg.  3. Left atrial size was severely dilated.  4. Right atrial size was moderately dilated.  5. A small pericardial effusion is present. The pericardial effusion is posterior and lateral to the left ventricle. There is no evidence of cardiac tamponade.  6. The mitral valve is degenerative. Mild mitral valve regurgitation. No evidence of mitral stenosis. Moderate mitral annular calcification.  7. The aortic valve is tricuspid. There is mild calcification of the aortic valve. Aortic valve regurgitation is mild. No aortic stenosis is present.  8. Aortic dilatation noted. There is borderline dilatation of the aortic root, measuring 38 mm.  9. The inferior vena cava is normal in size with <50% respiratory variability, suggesting right atrial pressure of 8 mmHg. FINDINGS  Left Ventricle: Left ventricular ejection fraction, by estimation, is 55 to 60%. The left ventricle has normal function. The left ventricle has no regional wall motion abnormalities. The left ventricular internal cavity size was normal in size. There is  severe concentric left ventricular hypertrophy. Left ventricular diastolic parameters are indeterminate. Right Ventricle: The right ventricular size is normal. No increase in right ventricular wall thickness. Right ventricular systolic function is normal. There is moderately elevated pulmonary artery systolic pressure. The tricuspid regurgitant velocity is 3.70 m/s, and with an assumed right atrial pressure of 5 mmHg, the estimated right  ventricular systolic pressure is 59.8 mmHg. Left Atrium: Left atrial size was severely dilated. Right Atrium: Right atrial size was moderately dilated. Pericardium: A small pericardial effusion is present. The pericardial effusion is posterior and lateral to the left ventricle. There is no evidence of cardiac tamponade. Mitral Valve: The mitral valve is degenerative in appearance. There is mild calcification of the mitral valve leaflet(s). Moderate mitral annular calcification. Mild mitral valve regurgitation. No evidence of mitral valve stenosis. Tricuspid Valve: The tricuspid valve is normal in structure. Tricuspid valve regurgitation is trivial. No evidence of tricuspid stenosis. Aortic Valve: The aortic valve is tricuspid. There is mild calcification of the aortic valve. Aortic valve regurgitation is mild. Aortic regurgitation PHT measures 449 msec. No aortic stenosis is present. Pulmonic Valve: The pulmonic valve was normal in structure. Pulmonic valve regurgitation is trivial. No evidence of pulmonic stenosis. Aorta: Aortic dilatation noted. There is borderline dilatation of the aortic root, measuring 38 mm. Venous: The inferior vena cava is normal in size with less than 50% respiratory variability, suggesting right atrial pressure of 8 mmHg. IAS/Shunts: No atrial level shunt detected by color flow Doppler.  LEFT VENTRICLE PLAX 2D LVIDd:         4.60 cm   Diastology LVIDs:         3.10 cm   LV e' lateral: 6.42 cm/s LV PW:         1.60 cm LV IVS:        1.50 cm LVOT diam:     1.90 cm LV SV:         43 LV SV Index:   22 LVOT Area:     2.84 cm  RIGHT VENTRICLE            IVC RV S prime:     7.94 cm/s  IVC diam: 1.50 cm TAPSE (M-mode): 0.8 cm LEFT ATRIUM             Index        RIGHT ATRIUM           Index LA diam:        4.10 cm 2.14 cm/m   RA Area:     21.00 cm LA Vol (A2C):   80.0 ml 41.82 ml/m  RA Volume:   63.20 ml  33.04 ml/m LA Vol (A4C):   95.1 ml 49.71 ml/m LA Biplane Vol: 92.1 ml 48.15 ml/m   AORTIC VALVE LVOT Vmax:   77.90 cm/s LVOT Vmean:  49.900 cm/s LVOT VTI:    0.150 m AI PHT:      449 msec  AORTA Ao Root diam: 3.80 cm Ao Asc diam:  3.30 cm TRICUSPID VALVE TR Peak grad:   54.8 mmHg TR Vmax:        370.00 cm/s  SHUNTS Systemic VTI:  0.15 m Systemic Diam: 1.90 cm Jules Oar MD Electronically signed by Jules Oar MD Signature Date/Time: 11/22/2023/11:16:48 AM    Final    MR BRAIN WO CONTRAST Result Date: 11/22/2023 CLINICAL DATA:  Headache EXAM: MRI HEAD WITHOUT CONTRAST TECHNIQUE: Multiplanar, multiecho pulse sequences of the brain and surrounding structures were obtained without intravenous contrast. COMPARISON:  11/21/2015 FINDINGS: Brain: Small focus of acute ischemia in the right cerebellum. Hemosiderin deposition in the right cerebellum at the site of old infarct. Multiple chronic microhemorrhages in a predominantly peripheral distribution. Bihemispheric subarachnoid siderosis. There is multifocal hyperintense T2-weighted signal within the white matter. Generalized volume loss. Old right frontal and occipital infarcts. The midline structures  are normal. Vascular: Normal flow voids. Skull and upper cervical spine: Normal calvarium and skull base. Visualized upper cervical spine and soft tissues are normal. Sinuses/Orbits:No paranasal sinus fluid levels or advanced mucosal thickening. No mastoid or middle ear effusion. Normal orbits. IMPRESSION: 1. Small focus of acute ischemia in the right cerebellum. No hemorrhage or mass effect. 2. Multiple chronic microhemorrhages in a predominantly peripheral distribution, which may indicate cerebral amyloid angiopathy. 3. Old right frontal and occipital infarcts. Electronically Signed   By: Juanetta Nordmann M.D.   On: 11/22/2023 00:34   DG Chest Port 1 View Result Date: 11/21/2023 CLINICAL DATA:  Weakness. EXAM: PORTABLE CHEST 1 VIEW COMPARISON:  Chest radiograph dated Dec 08, 2015. FINDINGS: Stable cardiomegaly. Aortic atherosclerosis. No focal  consolidation, pleural effusion, or pneumothorax. No acute osseous abnormality. IMPRESSION: 1. No acute cardiopulmonary findings. 2. Stable cardiomegaly. Electronically Signed   By: Mannie Seek M.D.   On: 11/21/2023 19:52   CT Head Wo Contrast Result Date: 11/21/2023 CLINICAL DATA:  Headache EXAM: CT HEAD WITHOUT CONTRAST TECHNIQUE: Contiguous axial images were obtained from the base of the skull through the vertex without intravenous contrast. RADIATION DOSE REDUCTION: This exam was performed according to the departmental dose-optimization program which includes automated exposure control, adjustment of the mA and/or kV according to patient size and/or use of iterative reconstruction technique. COMPARISON:  Head CT 01/30/2016. FINDINGS: Brain: No evidence of acute infarction, hemorrhage, hydrocephalus, or acute extra-axial collection or mass lesion/mass effect. Bifrontal low-density subdural fluid collections are unchanged from the prior study. Left Vascular: Atherosclerotic calcifications are present within the cavernous internal carotid arteries. Skull: Normal. Negative for fracture or focal lesion. Sinuses/Orbits: No acute finding. Other: None. IMPRESSION: 1. No acute intracranial process. 2. 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 Electronically Signed   By: Tyron Gallon M.D.   On: 11/21/2023 19:47     TODAY-DAY OF DISCHARGE:  Subjective:   Patrick [name] today has no headache,no chest abdominal pain,no new weakness tingling or numbness, feels much better wants to go home today.   Objective:   Blood pressure (!) 179/72, pulse 71, temperature 98.2 F (36.8 C), temperature source Oral, resp. rate 14, height 5\' 7"  (1.702 m), weight 79.6 kg, SpO2 96%.  Intake/Output Summary (Last 24 hours) at 11/25/2023 0828 Last data filed at 11/25/2023 0329 Gross per 24 hour  Intake 640 ml  Output 750 ml  Net -110 ml   Filed  Weights   11/21/23 2045  Weight: 79.6 kg    Exam: Awake Alert, Oriented *3, No new F.N deficits, Normal affect [place].AT,PERRAL Supple Neck,No JVD, No cervical lymphadenopathy appriciated.  Symmetrical Chest wall movement, Good air movement bilaterally, CTAB RRR,No Gallops,Rubs or new Murmurs, No Parasternal Heave +ve B.Sounds, Abd Soft, Non tender, No organomegaly appriciated, No rebound -guarding or rigidity. No Cyanosis, Clubbing or edema, No new Rash or bruise   PERTINENT RADIOLOGIC STUDIES: MR ANGIO HEAD WO CONTRAST Result Date: 11/24/2023 CLINICAL DATA:  Stroke, follow-up. Acute lacunar infarct of the right cerebellum. EXAM: MRA HEAD WITHOUT CONTRAST TECHNIQUE: Angiographic images of the Circle of Willis were acquired using MRA technique without intravenous contrast. COMPARISON:  MR head without contrast 11/22/2023 FINDINGS: Anterior circulation: High cervical segments are within normal limits bilaterally. 50% stenosis is present within the left ophthalmic segment. Moderate high-grade focal stenosis is present in the supraclinoid right ICA. The ICA termini are within limits bilaterally. The right A1 segment is dominant. Mild narrowing is present  the distal left M1 segment. Moderate to high-grade stenoses are present in the proximal posterior left M2 segments. More distal segmental narrowing is present in the right MCA branch vessels. Moderate segmental stenoses are present in the A2 segments bilaterally. Moderate stenosis is present in the proximal right pericallosal artery. Posterior circulation: The to left vertebral artery dominant. High-grade stenoses or occlusion are present in the distal right V4 segment. Proximal basilar artery is somewhat irregular. Moderate to high-grade stenosis is present in the more distal basilar artery just below the superior cerebellar arteries. The superior cerebellar arteries are patent bilaterally. Moderate stenosis is present in the distal left P2 segment. Right  posterior cerebral artery is of fetal type. Moderate stenosis is present in the proximal right P2 segment. Mild attenuation of distal PCA branch vessels is present bilaterally. Anatomic variants: Fetal type right posterior cerebral artery. Other: None. IMPRESSION: 1. High-grade stenoses or occlusion in the distal right V4 segment. 2. Moderate to high-grade stenoses in the proximal posterior left M2 segments. 3. Moderate to high-grade stenosis in the more distal basilar artery just below the superior cerebellar arteries. 4. Moderate stenosis in the distal left P2 segment. 5. Moderate stenosis in the proximal right P2 segment. 6. Moderate segmental stenoses in the A2 segments bilaterally. 7. Moderate stenosis in the proximal right pericallosal artery. 8. 50% stenosis in the left ophthalmic segment. 9. Moderate high-grade focal stenosis in the supraclinoid right ICA. Electronically Signed   By: Audree Leas M.D.   On: 11/24/2023 18:41     PERTINENT LAB RESULTS: CBC: Recent Labs    11/25/23 0423  WBC 4.5  HGB 9.1*  HCT 29.4*  PLT 158   CMET CMP     Component Value Date/Time   NA 136 11/25/2023 0423   NA 143 06/18/2023 0000   K 4.4 11/25/2023 0423   CL 107 11/25/2023 0423   CO2 18 (L) 11/25/2023 0423   GLUCOSE 96 11/25/2023 0423   BUN 84 (H) 11/25/2023 0423   BUN 75 (A) 06/18/2023 0000   CREATININE 5.38 (H) 11/25/2023 0423   CREATININE 4.32 (H) 12/21/2022 1418   CALCIUM  7.6 (L) 11/25/2023 0423   CALCIUM  8.0 (L) 10/15/2023 1232   PROT 6.8 11/22/2023 0128   PROT 6.5 12/20/2020 1639   ALBUMIN 3.2 (L) 11/22/2023 0128   ALBUMIN 3.8 12/20/2020 1639   AST 18 11/22/2023 0128   ALT 11 11/22/2023 0128   ALKPHOS 79 11/22/2023 0128   BILITOT 1.0 11/22/2023 0128   BILITOT 0.7 12/20/2020 1639   GFR 21.73 (L) 11/16/2021 1056   EGFR 12 06/18/2023 0000   EGFR 17 (L) 08/03/2022 1322   GFRNONAA 10 (L) 11/25/2023 0423   GFRNONAA 29 (L) 10/18/2014 1516    GFR Estimated Creatinine  Clearance: 12 mL/min (A) (by C-G formula based on SCr of 5.38 mg/dL (H)). No results for input(s): "LIPASE", "AMYLASE" in the last 72 hours. No results for input(s): "CKTOTAL", "CKMB", "CKMBINDEX", "TROPONINI" in the last 72 hours. Invalid input(s): "POCBNP" No results for input(s): "DDIMER" in the last 72 hours. No results for input(s): "HGBA1C" in the last 72 hours. No results for input(s): "CHOL", "HDL", "LDLCALC", "TRIG", "CHOLHDL", "LDLDIRECT" in the last 72 hours. No results for input(s): "TSH", "T4TOTAL", "T3FREE", "THYROIDAB" in the last 72 hours.  Invalid input(s): "FREET3" No results for input(s): "VITAMINB12", "FOLATE", "FERRITIN", "TIBC", "IRON", "RETICCTPCT" in the last 72 hours. Coags: No results for input(s): "INR" in the last 72 hours.  Invalid input(s): "PT" Microbiology: No results found for  this or any previous visit (from the past 240 hours).  FURTHER DISCHARGE INSTRUCTIONS:  Get Medicines reviewed and adjusted: Please take all your medications with you for your next visit with your Primary MD  Laboratory/radiological data: Please request your Primary MD to go over all hospital tests and procedure/radiological results at the follow up, please ask your Primary MD to get all Hospital records sent to his/her office.  In some cases, they will be blood work, cultures and biopsy results pending at the time of your discharge. Please request that your primary care M.D. goes through all the records of your hospital data and follows up on these results.  Also Note the following: If you experience worsening of your admission symptoms, develop shortness of breath, life threatening emergency, suicidal or homicidal thoughts you must seek medical attention immediately by calling 911 or calling your MD immediately  if symptoms less severe.  You must read complete instructions/literature along with all the possible adverse reactions/side effects for all the Medicines you take and  that have been prescribed to you. Take any new Medicines after you have completely understood and accpet all the possible adverse reactions/side effects.   Do not drive when taking Pain medications or sleeping medications (Benzodaizepines)  Do not take more than prescribed Pain, Sleep and Anxiety Medications. It is not advisable to combine anxiety,sleep and pain medications without talking with your primary care practitioner  Special Instructions: If you have smoked or chewed Tobacco  in the last 2 yrs please stop smoking, stop any regular Alcohol   and or any Recreational drug use.  Wear Seat belts while driving.  Please note: You were cared for by a hospitalist during your hospital stay. Once you are discharged, your primary care physician will handle any further medical issues. Please note that NO REFILLS for any discharge medications will be authorized once you are discharged, as it is imperative that you return to your primary care physician (or establish a relationship with a primary care physician if you do not have one) for your post hospital discharge needs so that they can reassess your need for medications and monitor your lab values.  Total Time spent coordinating discharge including counseling, education and face to face time equals greater than 30 minutes.  SignedKimberly Penna 11/25/2023 8:28 AM

## 2023-11-25 NOTE — Evaluation (Signed)
 Speech Language Pathology Evaluation Patient Details Name: Jakiah Sheeks MRN: [id number] DOB: 09-16-1945 Today's Date: 11/25/2023 Time: 0945-1000 SLP Time Calculation (min) (ACUTE ONLY): 15 min  Problem List:  Patient Active Problem List   Diagnosis Date Noted   Stroke (cerebrum) (HCC) 11/22/2023   Hypertensive emergency 11/21/2023   History of atrial fibrillation 11/16/2021   Uncontrolled type 2 diabetes mellitus with hyperglycemia (HCC) 11/16/2021   Cerebrovascular accident (CVA) due to bilateral embolism of middle cerebral arteries (HCC)    History of stroke    Systolic heart failure (HCC) 11/18/2015   Hematuria 08/30/2015   Chronic anemia 03/31/2014   Healthcare maintenance 03/31/2014   Permanent atrial fibrillation (HCC) 07/17/2013   Chronic anticoagulation, on coumadin  07/17/2013   Tachy-brady syndrome (HCC) 07/17/2013   Acute ischemic VBA thalamic stroke (HCC) 12/11/2010   Chronic kidney disease (CKD), stage IV (severe) (HCC) 12/21/2008   Coronary atherosclerosis 07/19/2006   Diabetes mellitus (HCC) 06/05/2006   Hyperlipidemia 06/05/2006   Gout 06/05/2006   Essential hypertension 06/05/2006   Sleep apnea 06/05/2006   Past Medical History:  Past Medical History:  Diagnosis Date   Atrial fibrillation (HCC)    CAD (coronary artery disease)    Chronic anticoagulation, on coumadin  07/17/2013   CKD (chronic kidney disease), stage II    CVA (cerebral infarction) 11/22/10   Thalamic with residual memory loss and slow speech   Depression    Diabetes mellitus type II    Insulin  dependent   GERD (gastroesophageal reflux disease)    Gout    Hyperlipidemia    Hypertension    Myocardial infarction (HCC) 2000's   "near heart attack" (07/14/2013)   Paroxysmal atrial fibrillation (HCC)    on coumadin    Permanent atrial fibrillation (HCC) 07/17/2013   Shortness of breath    "can happen at any time" (07/14/2013)   Sleep apnea 07/2010   "has mask at home; seldom uses it"  (07/14/2013)   Stomach ulcer 1950's   "as a teenager"   Stroke (HCC) ~ 2007; ~ 2009   "memory not as good since" (07/14/2013)   Tachy-brady syndrome (HCC) 07/17/2013   Upper GI bleeding 07/01/2013   Past Surgical History:  Past Surgical History:  Procedure Laterality Date   CARDIAC CATHETERIZATION  07/2003   [name] notes 08/27/2005 (07/01/2013)   HPI:  78yo male admitted 11/21/23 from home with weakness. PMH: Thalamic CVA (2007, 2012) with residual memory loss and slow speech, CKD4, chronic anemia, permanent AFib, CHF, depression, gout, OSA, GERD, CAD, MI, HTN. CXR negative. MRI: 1. Small focus of acute ischemia in the right cerebellum. No  hemorrhage or mass effect. 2. Multiple chronic microhemorrhages in a predominantly peripheral distribution, which may indicate cerebral amyloid angiopathy. 3. Old right frontal and occipital infarcts.   Assessment / Plan / Recommendation Clinical Impression  Pt seen at bedside for cognitive linguistic evaluation. Chart review indicates baseline cognitive linguistic deficits, including reduced short term memory and "slow speech". Pt awake and alert this morning. Speech is fully intelligible. The Mini-Mental State Examination (MMSE) was administered. Pt scored 17/28 (writing tasks not administered, as pt declined), indicating deficits. Pt exhibited difficulty with orientation to time and place, attention, and delayed recall. Pt reports he manages his medications and prepares meals at home. No family present to discuss prior level of function. Pt also indicates he is at/near his baseline level of function. Recommend home health ST if needs arise upon return to normal routines. Acute ST signing off at this time.    SLP  Assessment  SLP Recommendation/Assessment: All further Speech Language Pathology needs can be addressed in the next venue of care if needs arise.   SLP Visit Diagnosis: Cognitive communication deficit (R41.841)    Recommendations for follow up  therapy are one component of a multi-disciplinary discharge planning process, led by the attending physician.  Recommendations may be updated based on patient status, additional functional criteria and insurance authorization.    Follow Up Recommendations  Other (comment) (HH ST if needs arise.)    Assistance Recommended at Discharge  Frequent or constant Supervision/Assistance  Functional Status Assessment Patient has not had a recent decline in their functional status     SLP Evaluation Cognition  Overall Cognitive Status: History of cognitive impairments - at baseline Arousal/Alertness: Awake/alert Orientation Level: Oriented to person       Comprehension  Auditory Comprehension Overall Auditory Comprehension: Appears within functional limits for tasks assessed    Expression Expression Primary Mode of Expression: Verbal Verbal Expression Overall Verbal Expression: Appears within functional limits for tasks assessed   Oral / Motor  Oral Motor/Sensory Function Overall Oral Motor/Sensory Function: Within functional limits Motor Speech Overall Motor Speech: Appears within functional limits for tasks assessed Intelligibility: Intelligible           Nakiah Osgood B. Arby Beam, [facility], CCC-SLP Speech Language Pathologist Office: [phone]  Adine Ahmadi 11/25/2023, 10:10 AM

## 2023-11-25 NOTE — Progress Notes (Signed)
 STROKE TEAM PROGRESS NOTE   SUBJECTIVE (INTERVAL HISTORY) No family is at the bedside.  Patient is working with PT in the hallway and in room. Pt walks with walker, slow but no tendency to fall. MRA showed multifocal intracranial stenosis   OBJECTIVE Temp:  [98 F (36.7 C)-98.9 F (37.2 C)] 98.2 F (36.8 C) (04/28 0800) Pulse Rate:  [62-82] 77 (04/28 1010) Cardiac Rhythm: Atrial fibrillation (04/28 0700) Resp:  [13-21] 18 (04/28 1010) BP: (140-182)/(62-87) 146/64 (04/28 1010) SpO2:  [93 %-99 %] 97 % (04/28 1010)  No results for input(s): "GLUCAP" in the last 168 hours. Recent Labs  Lab 11/21/23 1820 11/22/23 0128 11/23/23 0936 11/24/23 0540 11/25/23 0423  NA 143 144 142 139 136  K 3.3* 3.4* 3.4* 3.4* 4.4  CL 109 111 110 109 107  CO2 19* 16* 20* 19* 18*  GLUCOSE 117* 122* 104* 87 96  BUN 89* 94* 93* 89* 84*  CREATININE 5.26* 5.10* 5.38* 5.19* 5.38*  CALCIUM  8.0* 8.0* 7.8* 7.5* 7.6*  MG  --  1.7 1.9  --   --   PHOS  --  4.6  --   --   --    Recent Labs  Lab 11/22/23 0128  AST 18  ALT 11  ALKPHOS 79  BILITOT 1.0  PROT 6.8  ALBUMIN 3.2*   Recent Labs  Lab 11/21/23 1820 11/22/23 0128 11/25/23 0423  WBC 4.6 4.3 4.5  NEUTROABS 3.3  --   --   HGB 10.7* 10.6* 9.1*  HCT 34.7* 34.9* 29.4*  MCV 85.0 85.7 84.0  PLT 153 167 158   No results for input(s): "CKTOTAL", "CKMB", "CKMBINDEX", "TROPONINI" in the last 168 hours. No results for input(s): "LABPROT", "INR" in the last 72 hours.  No results for input(s): "COLORURINE", "LABSPEC", "PHURINE", "GLUCOSEU", "HGBUR", "BILIRUBINUR", "KETONESUR", "PROTEINUR", "UROBILINOGEN", "NITRITE", "LEUKOCYTESUR" in the last 72 hours.  Invalid input(s): "APPERANCEUR"      Component Value Date/Time   CHOL 156 11/22/2023 0128   CHOL 204 (H) 12/20/2020 1639   TRIG 68 11/22/2023 0128   HDL 58 11/22/2023 0128   HDL 62 12/20/2020 1639   CHOLHDL 2.7 11/22/2023 0128   VLDL 14 11/22/2023 0128   LDLCALC 84 11/22/2023 0128   LDLCALC  86 12/21/2022 1418   Lab Results  Component Value Date   HGBA1C 5.4 11/22/2023      Component Value Date/Time   LABOPIA NONE DETECTED 11/22/2010 0831   COCAINSCRNUR NONE DETECTED 11/22/2010 0831   LABBENZ NONE DETECTED 11/22/2010 0831   AMPHETMU NONE DETECTED 11/22/2010 0831   THCU NONE DETECTED 11/22/2010 0831   LABBARB  11/22/2010 0831    NONE DETECTED        DRUG SCREEN FOR MEDICAL PURPOSES ONLY.  IF CONFIRMATION IS NEEDED FOR ANY PURPOSE, NOTIFY LAB WITHIN 5 DAYS.        LOWEST DETECTABLE LIMITS FOR URINE DRUG SCREEN Drug Class       Cutoff (ng/mL) Amphetamine      1000 Barbiturate      200 Benzodiazepine   200 Tricyclics       300 Opiates          300 Cocaine          300 THC              50    No results for input(s): "ETH" in the last 168 hours.  I have personally reviewed the radiological images below and agree with the radiology interpretations.  MR ANGIO  HEAD WO CONTRAST Result Date: 11/24/2023 CLINICAL DATA:  Stroke, follow-up. Acute lacunar infarct of the right cerebellum. EXAM: MRA HEAD WITHOUT CONTRAST TECHNIQUE: Angiographic images of the Circle of Willis were acquired using MRA technique without intravenous contrast. COMPARISON:  MR head without contrast 11/22/2023 FINDINGS: Anterior circulation: High cervical segments are within normal limits bilaterally. 50% stenosis is present within the left ophthalmic segment. Moderate high-grade focal stenosis is present in the supraclinoid right ICA. The ICA termini are within limits bilaterally. The right A1 segment is dominant. Mild narrowing is present the distal left M1 segment. Moderate to high-grade stenoses are present in the proximal posterior left M2 segments. More distal segmental narrowing is present in the right MCA branch vessels. Moderate segmental stenoses are present in the A2 segments bilaterally. Moderate stenosis is present in the proximal right pericallosal artery. Posterior circulation: The to left  vertebral artery dominant. High-grade stenoses or occlusion are present in the distal right V4 segment. Proximal basilar artery is somewhat irregular. Moderate to high-grade stenosis is present in the more distal basilar artery just below the superior cerebellar arteries. The superior cerebellar arteries are patent bilaterally. Moderate stenosis is present in the distal left P2 segment. Right posterior cerebral artery is of fetal type. Moderate stenosis is present in the proximal right P2 segment. Mild attenuation of distal PCA branch vessels is present bilaterally. Anatomic variants: Fetal type right posterior cerebral artery. Other: None. IMPRESSION: 1. High-grade stenoses or occlusion in the distal right V4 segment. 2. Moderate to high-grade stenoses in the proximal posterior left M2 segments. 3. Moderate to high-grade stenosis in the more distal basilar artery just below the superior cerebellar arteries. 4. Moderate stenosis in the distal left P2 segment. 5. Moderate stenosis in the proximal right P2 segment. 6. Moderate segmental stenoses in the A2 segments bilaterally. 7. Moderate stenosis in the proximal right pericallosal artery. 8. 50% stenosis in the left ophthalmic segment. 9. Moderate high-grade focal stenosis in the supraclinoid right ICA. Electronically Signed   By: Audree Leas M.D.   On: 11/24/2023 18:41   VAS US  CAROTID (at [facility] and [facility] only) Result Date: 11/22/2023 Carotid Arterial Duplex Study Patient Name:  JONAHTAN HIRA  Date of Exam:   11/22/2023 Medical Rec #: [id number]     Accession #:    [id number] Date of Birth: [birth year]      Patient Gender: M Patient Age:   78 years Exam Location:  [facility] Procedure:      VAS US  CAROTID Referring Phys: Vada Garibaldi --------------------------------------------------------------------------------  Indications:       TIA and Onset headache. Risk Factors:      Hypertension, Diabetes. Other Factors:     H/O stroke, A-fid on Eliquis .  Limitations        Today's exam was limited due to Extremely tortuous vessels. Comparison Study:  Previous study on 4.23.2017. Performing Technologist: Ria Chad  Examination Guidelines: A complete evaluation includes B-mode imaging, spectral Doppler, color Doppler, and power Doppler as needed of all accessible portions of each vessel. Bilateral testing is considered an integral part of a complete examination. Limited examinations for reoccurring indications may be performed as noted.  Right Carotid Findings: +----------+--------+--------+--------+------------------+--------+           PSV cm/sEDV cm/sStenosisPlaque DescriptionComments +----------+--------+--------+--------+------------------+--------+ CCA Prox  47      6                                          +----------+--------+--------+--------+------------------+--------+  CCA Distal49      15                                         +----------+--------+--------+--------+------------------+--------+ ICA Prox  42      18              calcific                   +----------+--------+--------+--------+------------------+--------+ ICA Mid   86      31                                         +----------+--------+--------+--------+------------------+--------+ ICA Distal52      23                                         +----------+--------+--------+--------+------------------+--------+ ECA       81                                                 +----------+--------+--------+--------+------------------+--------+ +----------+--------+-------+--------+-------------------+           PSV cm/sEDV cmsDescribeArm Pressure (mmHG) +----------+--------+-------+--------+-------------------+ Subclavian170                                        +----------+--------+-------+--------+-------------------+ +---------+--------+--+--------+--+ VertebralPSV cm/s85EDV cm/s32 +---------+--------+--+--------+--+   Left Carotid Findings: +----------+--------+--------+--------+------------------+--------+           PSV cm/sEDV cm/sStenosisPlaque DescriptionComments +----------+--------+--------+--------+------------------+--------+ CCA Prox  46      6                                          +----------+--------+--------+--------+------------------+--------+ CCA Distal39      7                                          +----------+--------+--------+--------+------------------+--------+ ICA Prox  49      17              calcific                   +----------+--------+--------+--------+------------------+--------+ ICA Mid   50      16                                         +----------+--------+--------+--------+------------------+--------+ ICA Distal47      18                                         +----------+--------+--------+--------+------------------+--------+ ECA       65      5                                          +----------+--------+--------+--------+------------------+--------+ +----------+--------+--------+--------+-------------------+  PSV cm/sEDV cm/sDescribeArm Pressure (mmHG) +----------+--------+--------+--------+-------------------+ Subclavian132                                         +----------+--------+--------+--------+-------------------+ +---------+--------+--+--------+--+ VertebralPSV cm/s45EDV cm/s13 +---------+--------+--+--------+--+   Summary: Right Carotid: Velocities in the right ICA are consistent with a 1-39% stenosis. Left Carotid: Velocities in the left ICA are consistent with a 1-39% stenosis. Vertebrals:  Bilateral vertebral arteries demonstrate antegrade flow. Subclavians: Normal flow hemodynamics were seen in bilateral subclavian              arteries. *See table(s) above for measurements and observations.  Electronically signed by Jimmye Moulds MD on 11/22/2023 at 12:08:48 PM.    Final    ECHOCARDIOGRAM  COMPLETE Result Date: 11/22/2023    ECHOCARDIOGRAM REPORT   Patient Name:   SYEED RATHGEBER Date of Exam: 11/22/2023 Medical Rec #:  [id number]    Height:       67.0 in Accession #:    [id number]   Weight:       175.5 lb Date of Birth:  09-Feb-1946     BSA:          1.913 m Patient Age:    77 years     BP:           187/92 mmHg Patient Gender: M            HR:           69 bpm. Exam Location:  Inpatient Procedure: 2D Echo (Both Spectral and Color Flow Doppler were utilized during            procedure). Indications:    elevated troponin  History:        Patient has prior history of Echocardiogram examinations, most                 recent 08/03/2022. Chronic kidney disease, Arrythmias:Atrial                 Fibrillation and PVC; Risk Factors:Hypertension, Dyslipidemia,                 Diabetes and Sleep Apnea.  Sonographer:    Dione Franks RDCS Referring Phys: [id number] SARA-MAIZ A THOMAS IMPRESSIONS  1. Left ventricular ejection fraction, by estimation, is 55 to 60%. The left ventricle has normal function. The left ventricle has no regional wall motion abnormalities. There is severe concentric left ventricular hypertrophy. Left ventricular diastolic  parameters are indeterminate.  2. Right ventricular systolic function is normal. The right ventricular size is normal. There is moderately elevated pulmonary artery systolic pressure. The estimated right ventricular systolic pressure is 59.8 mmHg.  3. Left atrial size was severely dilated.  4. Right atrial size was moderately dilated.  5. A small pericardial effusion is present. The pericardial effusion is posterior and lateral to the left ventricle. There is no evidence of cardiac tamponade.  6. The mitral valve is degenerative. Mild mitral valve regurgitation. No evidence of mitral stenosis. Moderate mitral annular calcification.  7. The aortic valve is tricuspid. There is mild calcification of the aortic valve. Aortic valve regurgitation is mild. No aortic stenosis is  present.  8. Aortic dilatation noted. There is borderline dilatation of the aortic root, measuring 38 mm.  9. The inferior vena cava is normal in size with <50% respiratory variability, suggesting right atrial pressure of 8 mmHg. FINDINGS  Left Ventricle: Left  ventricular ejection fraction, by estimation, is 55 to 60%. The left ventricle has normal function. The left ventricle has no regional wall motion abnormalities. The left ventricular internal cavity size was normal in size. There is  severe concentric left ventricular hypertrophy. Left ventricular diastolic parameters are indeterminate. Right Ventricle: The right ventricular size is normal. No increase in right ventricular wall thickness. Right ventricular systolic function is normal. There is moderately elevated pulmonary artery systolic pressure. The tricuspid regurgitant velocity is 3.70 m/s, and with an assumed right atrial pressure of 5 mmHg, the estimated right ventricular systolic pressure is 59.8 mmHg. Left Atrium: Left atrial size was severely dilated. Right Atrium: Right atrial size was moderately dilated. Pericardium: A small pericardial effusion is present. The pericardial effusion is posterior and lateral to the left ventricle. There is no evidence of cardiac tamponade. Mitral Valve: The mitral valve is degenerative in appearance. There is mild calcification of the mitral valve leaflet(s). Moderate mitral annular calcification. Mild mitral valve regurgitation. No evidence of mitral valve stenosis. Tricuspid Valve: The tricuspid valve is normal in structure. Tricuspid valve regurgitation is trivial. No evidence of tricuspid stenosis. Aortic Valve: The aortic valve is tricuspid. There is mild calcification of the aortic valve. Aortic valve regurgitation is mild. Aortic regurgitation PHT measures 449 msec. No aortic stenosis is present. Pulmonic Valve: The pulmonic valve was normal in structure. Pulmonic valve regurgitation is trivial. No evidence of  pulmonic stenosis. Aorta: Aortic dilatation noted. There is borderline dilatation of the aortic root, measuring 38 mm. Venous: The inferior vena cava is normal in size with less than 50% respiratory variability, suggesting right atrial pressure of 8 mmHg. IAS/Shunts: No atrial level shunt detected by color flow Doppler.  LEFT VENTRICLE PLAX 2D LVIDd:         4.60 cm   Diastology LVIDs:         3.10 cm   LV e' lateral: 6.42 cm/s LV PW:         1.60 cm LV IVS:        1.50 cm LVOT diam:     1.90 cm LV SV:         43 LV SV Index:   22 LVOT Area:     2.84 cm  RIGHT VENTRICLE            IVC RV S prime:     7.94 cm/s  IVC diam: 1.50 cm TAPSE (M-mode): 0.8 cm LEFT ATRIUM             Index        RIGHT ATRIUM           Index LA diam:        4.10 cm 2.14 cm/m   RA Area:     21.00 cm LA Vol (A2C):   80.0 ml 41.82 ml/m  RA Volume:   63.20 ml  33.04 ml/m LA Vol (A4C):   95.1 ml 49.71 ml/m LA Biplane Vol: 92.1 ml 48.15 ml/m  AORTIC VALVE LVOT Vmax:   77.90 cm/s LVOT Vmean:  49.900 cm/s LVOT VTI:    0.150 m AI PHT:      449 msec  AORTA Ao Root diam: 3.80 cm Ao Asc diam:  3.30 cm TRICUSPID VALVE TR Peak grad:   54.8 mmHg TR Vmax:        370.00 cm/s  SHUNTS Systemic VTI:  0.15 m Systemic Diam: 1.90 cm Jules Oar MD Electronically signed by Jules Oar MD Signature Date/Time: 11/22/2023/11:16:48 AM  Final    MR BRAIN WO CONTRAST Result Date: 11/22/2023 CLINICAL DATA:  Headache EXAM: MRI HEAD WITHOUT CONTRAST TECHNIQUE: Multiplanar, multiecho pulse sequences of the brain and surrounding structures were obtained without intravenous contrast. COMPARISON:  11/21/2015 FINDINGS: Brain: Small focus of acute ischemia in the right cerebellum. Hemosiderin deposition in the right cerebellum at the site of old infarct. Multiple chronic microhemorrhages in a predominantly peripheral distribution. Bihemispheric subarachnoid siderosis. There is multifocal hyperintense T2-weighted signal within the white matter. Generalized  volume loss. Old right frontal and occipital infarcts. The midline structures are normal. Vascular: Normal flow voids. Skull and upper cervical spine: Normal calvarium and skull base. Visualized upper cervical spine and soft tissues are normal. Sinuses/Orbits:No paranasal sinus fluid levels or advanced mucosal thickening. No mastoid or middle ear effusion. Normal orbits. IMPRESSION: 1. Small focus of acute ischemia in the right cerebellum. No hemorrhage or mass effect. 2. Multiple chronic microhemorrhages in a predominantly peripheral distribution, which may indicate cerebral amyloid angiopathy. 3. Old right frontal and occipital infarcts. Electronically Signed   By: Juanetta Nordmann M.D.   On: 11/22/2023 00:34   DG Chest Port 1 View Result Date: 11/21/2023 CLINICAL DATA:  Weakness. EXAM: PORTABLE CHEST 1 VIEW COMPARISON:  Chest radiograph dated Dec 08, 2015. FINDINGS: Stable cardiomegaly. Aortic atherosclerosis. No focal consolidation, pleural effusion, or pneumothorax. No acute osseous abnormality. IMPRESSION: 1. No acute cardiopulmonary findings. 2. Stable cardiomegaly. Electronically Signed   By: Mannie Seek M.D.   On: 11/21/2023 19:52   CT Head Wo Contrast Result Date: 11/21/2023 CLINICAL DATA:  Headache EXAM: CT HEAD WITHOUT CONTRAST TECHNIQUE: Contiguous axial images were obtained from the base of the skull through the vertex without intravenous contrast. RADIATION DOSE REDUCTION: This exam was performed according to the departmental dose-optimization program which includes automated exposure control, adjustment of the mA and/or kV according to patient size and/or use of iterative reconstruction technique. COMPARISON:  Head CT 01/30/2016. FINDINGS: Brain: No evidence of acute infarction, hemorrhage, hydrocephalus, or acute extra-axial collection or mass lesion/mass effect. Bifrontal low-density subdural fluid collections are unchanged from the prior study. Left Vascular: Atherosclerotic calcifications  are present within the cavernous internal carotid arteries. Skull: Normal. Negative for fracture or focal lesion. Sinuses/Orbits: No acute finding. Other: None. IMPRESSION: 1. No acute intracranial process. 2. 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 Electronically Signed   By: Tyron Gallon M.D.   On: 11/21/2023 19:47     PHYSICAL EXAM  Temp:  [98 F (36.7 C)-98.9 F (37.2 C)] 98.2 F (36.8 C) (04/28 0800) Pulse Rate:  [62-82] 77 (04/28 1010) Resp:  [13-21] 18 (04/28 1010) BP: (140-182)/(62-87) 146/64 (04/28 1010) SpO2:  [93 %-99 %] 97 % (04/28 1010)  General - Well nourished, well developed, in no apparent distress.  Ophthalmologic - fundi not visualized due to noncooperation.  Cardiovascular - irregularly irregular heart rate and rhythm.  Neuro - awake, alert, eyes open, orientated to age, place, year but not to month. No aphasia, paucity of speech, following all simple commands with mild psychomotor slowing. Able to name and repeat simple sentence. No gaze palsy, tracking bilaterally, visual field full. No facial droop. Tongue midline. Bilateral UEs 5/5, no drift. Bilaterally LEs 4/5, no drift. Sensation symmetrical bilaterally, b/l FTN intact, gait not tested.     ASSESSMENT/PLAN Mr. Cavanaugh Butzlaff is a 78 y.o. male with history of hypertension, diabetes, hyperlipidemia, CKD 4, CAD/MI, OSA, A-fib on Eliquis , vascular dementia, stroke admitted for imbalance  and gait difficulty. No TNK given due to outside window.    Stroke:  right cerebellar infarct, likely cardioembolic due to to afib not compliant with Eliquis  vs. Large vessel disease with right V4 occlusion CT no acute abnormality MRI right cerebellar small linear infarct MRA High-grade stenoses or occlusion in the distal right V4 segment. Moderate to high-grade stenoses in the proximal posterior left M2 segments. Moderate to high-grade stenosis in the  more distal basilar artery just below the superior cerebellar arteries. Moderate stenosis in the distal left P2 segment. Moderate stenosis in the proximal right P2 segment. Moderate segmental stenoses in the A2 segments bilaterally. Moderate stenosis in the proximal right pericallosal artery. 50% stenosis in the left ophthalmic segment. Moderate high-grade focal stenosis in the supraclinoid right ICA. Carotid Doppler unremarkable 2D Echo EF 55 to 60% LDL 84 HgbA1c 6.2 Eliquis  for VTE prophylaxis Eliquis  (apixaban ) 5mg  twice daily prior to admission, now on Eliquis  2.5mg  (apixaban ) twice daily given uncontrolled BP, CKD IV and CAA.  Patient counseled to be compliant with his antithrombotic medications and he agrees that his son will help him for medication  Ongoing aggressive stroke risk factor management Therapy recommendations: Home health PT and OT Disposition: Pending  History of stroke Intracranial stenosis, multifocal  10/2000 left thalamic infarct 10/2015 admitted for headache, high BP, right upper and lower extremity weakness.  INR 1.97 on Coumadin .  MRI showed bilateral ACA scattered infarcts.  Carotid Doppler negative.  EF 40 to 45%.  LDL 65, A1c 8.5.  Continue on Coumadin  and pravastatin  This admission, MRA showed multifocal intracranial stenosis including tandem stenosis of BA, right V4, left M2, b/l P2, b/l siphons Aggressive stroke risk factor modification On eliquis , not candidate for further additional antithrombotics due to CAA and  uncontrolled HTN  A-fib Was on Coumadin  in the past Currently on Eliquis  5mg  bid at home but missing several doses a week per family Now recommend Eliquis  2.5 mg twice a day given uncontrolled BP, CKD 4 and CAA. Recommend patient's son to help him with medication at home.  CAA MRI brain showed Multiple chronic microhemorrhages and hemosiderosis in a predominantly peripheral distribution, which may indicate cerebral amyloid angiopathy Recommend  strict BP control with SBP less than 140 Decreased Eliquis  from 5 to 2.5 twice daily, not candidate for further addition of antithrombotics  Diabetes HgbA1c 6.2 goal < 7.0 Controlled CBG monitoring SSI DM education and close PCP follow up  Hypertension Home meds including Coreg  6.25 twice daily and hydralazine  50 mg 3 times daily Stable, improved Continue amlodipine  10 Continue Coreg  6.25 Increase hydralazine  from 50 to 100 Add losartan  50 Long term BP goal normotensive  Hyperlipidemia Home meds: None LDL 84, goal < 70 Now on Lipitor 20 No high intensity statin given CAA, LDL not far from goal and advanced age Continue statin at discharge  Other Stroke Risk Factors Advanced age Coronary artery disease and MI Obstructive sleep apnea, on CPAP at home  Other Active Problems Vascular dementia CKD 4, creatinine 5.26--5.10--5.19--5.38  Hospital day # 4  Neurology will sign off. Please call with questions. Pt will follow up with Dr. Janett Medin at [facility] in about 4-6 weeks. Thanks for the consult.   Consuelo Denmark, MD PhD Stroke Neurology 11/25/2023 11:14 AM    To contact Stroke Continuity provider, please refer to [email or web address]. After hours, contact General Neurology

## 2023-11-25 NOTE — Care Management Important Message (Signed)
 Important Message  Patient Details  Name: Patrick [name] MRN: [id number] Date of Birth: [birth year]   Important Message Given:  Yes - Medicare IM     Wynonia Hedges 11/25/2023, 3:59 PM

## 2023-11-25 NOTE — Plan of Care (Signed)
  Problem: Ischemic Stroke/TIA Tissue Perfusion: Goal: Complications of ischemic stroke/TIA will be minimized Outcome: Not Progressing   Problem: Education: Goal: Knowledge of disease or condition will improve Outcome: Not Progressing Goal: Knowledge of secondary prevention will improve (MUST DOCUMENT ALL) Outcome: Not Progressing Goal: Knowledge of patient specific risk factors will improve (DELETE if not current risk factor) Outcome: Not Progressing   Problem: Nutrition: Goal: Adequate nutrition will be maintained Outcome: Not Progressing   Problem: Coping: Goal: Level of anxiety will decrease Outcome: Not Progressing

## 2023-11-25 NOTE — TOC Transition Note (Signed)
 Transition of Care [facility]) - Discharge Note   Patient Details  Name: Patrick [name] MRN: [id number] Date of Birth: May 23, 1946  Transition of [facility] CM/SW Contact:  Eusebio High, RN Phone Number: 11/25/2023, 9:06 AM   Clinical Narrative:     Patient to DC to home today to [facility] house  [address] [place] [address]. Home Health has been arranged. DME delivered. Family to transport  AVS has been updated    Barriers to Discharge: Continued Medical Work up   Patient Goals and CMS Choice            Discharge Placement                       Discharge Plan and Services Additional resources added to the After Visit Summary for     Discharge Planning Services: CM Consult                                 Social Drivers of Health (SDOH) Interventions SDOH Screenings   Food Insecurity: No Food Insecurity (11/22/2023)  Housing: Low Risk  (11/22/2023)  Transportation Needs: No Transportation Needs (11/22/2023)  Utilities: Not At Risk (11/22/2023)  Depression (PHQ2-9): Low Risk  (05/24/2023)  Physical Activity: Inactive (10/12/2019)  Social Connections: Moderately Integrated (11/22/2023)  Tobacco Use: Medium Risk (11/21/2023)     Readmission Risk Interventions     No data to display

## 2023-11-26 ENCOUNTER — Encounter: Payer: Self-pay | Admitting: Adult Health

## 2023-11-26 ENCOUNTER — Encounter (HOSPITAL_COMMUNITY)

## 2023-11-26 ENCOUNTER — Telehealth: Payer: Self-pay

## 2023-11-26 ENCOUNTER — Encounter (HOSPITAL_COMMUNITY): Payer: Self-pay

## 2023-11-26 ENCOUNTER — Ambulatory Visit (INDEPENDENT_AMBULATORY_CARE_PROVIDER_SITE_OTHER): Admitting: Adult Health

## 2023-11-26 VITALS — BP 142/70 | HR 70 | Temp 97.5°F | Ht 67.0 in | Wt 164.4 lb

## 2023-11-26 DIAGNOSIS — Z8673 Personal history of transient ischemic attack (TIA), and cerebral infarction without residual deficits: Secondary | ICD-10-CM

## 2023-11-26 DIAGNOSIS — E876 Hypokalemia: Secondary | ICD-10-CM

## 2023-11-26 DIAGNOSIS — F01B0 Vascular dementia, moderate, without behavioral disturbance, psychotic disturbance, mood disturbance, and anxiety: Secondary | ICD-10-CM

## 2023-11-26 DIAGNOSIS — I129 Hypertensive chronic kidney disease with stage 1 through stage 4 chronic kidney disease, or unspecified chronic kidney disease: Secondary | ICD-10-CM

## 2023-11-26 DIAGNOSIS — I4821 Permanent atrial fibrillation: Secondary | ICD-10-CM

## 2023-11-26 DIAGNOSIS — E785 Hyperlipidemia, unspecified: Secondary | ICD-10-CM

## 2023-11-26 DIAGNOSIS — D649 Anemia, unspecified: Secondary | ICD-10-CM

## 2023-11-26 DIAGNOSIS — M1A0710 Idiopathic chronic gout, right ankle and foot, without tophus (tophi): Secondary | ICD-10-CM

## 2023-11-26 DIAGNOSIS — N185 Chronic kidney disease, stage 5: Secondary | ICD-10-CM

## 2023-11-26 DIAGNOSIS — B351 Tinea unguium: Secondary | ICD-10-CM

## 2023-11-26 NOTE — Telephone Encounter (Signed)
 Message routed to PCP [name], Nino Bass C, NP who is scheduled to see patient today.

## 2023-11-26 NOTE — Progress Notes (Signed)
 [facility] clinic  Provider:  Inge Mangle DNP  Code Status:  Full Code  Goals of Care:     11/21/2023    8:45 PM  Advanced Directives  Does Patient Have a Medical Advance Directive? No  Would patient like information on creating a medical advance directive? No - Patient declined     Chief Complaint  Patient presents with   Hospitalization Follow-up    Hospital , stroke   got home , stated effect on right side , discuss  after care , and medication    Discussed the use of AI scribe software for clinical note transcription with the patient, who gave verbal consent to proceed.   HPI: Patient is a 78 y.o. male seen today for an acute visit for hospitalization follow up.   He was hospitalized from April 24 to April 28 for an acute cerebrovascular accident, likely embolic in nature, possibly due to poor compliance with Eliquis , attributed to dementia. During hospitalization, his Eliquis  dosage was adjusted to 2.5 mg twice daily due to increased bleeding risk.  He has chronic atrial fibrillation and is currently taking Eliquis  2.5 mg twice daily. He has not been under the care of a cardiologist recently.  He has chronic kidney disease stage 5 with a glomerular filtration rate of 10, indicating severe renal impairment. He is not currently on dialysis, and his family is considering this option.  He has hypertension, managed with carvedilol  12.5 mg twice daily, hydralazine  100 mg three times daily, and isosorbide mononitrate 30 mg daily.  He has hyperlipidemia and is taking atorvastatin  20 mg daily. His lipid levels were normal as of April 25.  He is anemic with a hemoglobin level of 9.1, likely due to chronic kidney disease. His potassium level was low during hospitalization but is now normal at 4.4.  He has a history of gout and is taking allopurinol  50 mg every other day. He also takes calcitriol 0.25 mg daily.  He lives in a multi-story home and is able to navigate stairs with  assistance. His family assists with cooking and ensures a diet low in sodium and fats.  He has regular bowel movements and no significant swelling in his legs. He is not diabetic, with an A1c of 5.4.   Past Medical History:  Diagnosis Date   Atrial fibrillation (HCC)    CAD (coronary artery disease)    Chronic anticoagulation, on coumadin  07/17/2013   CKD (chronic kidney disease), stage II    CVA (cerebral infarction) 11/22/2010   Thalamic with residual memory loss and slow speech   Depression    Diabetes mellitus type II    Insulin  dependent   GERD (gastroesophageal reflux disease)    Gout    Hyperlipidemia    Hypertension    Myocardial infarction (HCC) 03/31/1999   "near heart attack" (07/14/2013)   Paroxysmal atrial fibrillation (HCC)    on coumadin    Permanent atrial fibrillation (HCC) 07/17/2013   Shortness of breath    "can happen at any time" (07/14/2013)   Sleep apnea 07/30/2010   "has mask at home; seldom uses it" (07/14/2013)   Stomach ulcer 03/30/1949   "as a teenager"   Stroke (HCC) ~ 2007; ~ 2009   "memory not as good since" (07/14/2013)   Stroke (HCC)    Tachy-brady syndrome (HCC) 07/17/2013   Upper GI bleeding 07/01/2013    Past Surgical History:  Procedure Laterality Date   CARDIAC CATHETERIZATION  07/2003   [name] notes 08/27/2005 (07/01/2013)  No Known Allergies  Outpatient Encounter Medications as of 11/26/2023  Medication Sig   allopurinol  (ZYLOPRIM ) 100 MG tablet Take 0.5 tablets (50 mg total) by mouth every other day.   amLODipine  (NORVASC ) 10 MG tablet TAKE ONE TABLET BY MOUTH ONE TIME DAILY   apixaban  (ELIQUIS ) 2.5 MG TABS tablet Take 1 tablet (2.5 mg total) by mouth 2 (two) times daily.   atorvastatin  (LIPITOR) 20 MG tablet Take 1 tablet (20 mg total) by mouth daily.   calcitRIOL (ROCALTROL) 0.25 MCG capsule Take 0.25 mcg by mouth daily.   carvedilol  (COREG ) 12.5 MG tablet Take 1 tablet (12.5 mg total) by mouth 2 (two) times daily with a  meal.   hydrALAZINE  (APRESOLINE ) 100 MG tablet Take 1 tablet (100 mg total) by mouth 3 (three) times daily.   isosorbide mononitrate (IMDUR) 30 MG 24 hr tablet Take 1 tablet (30 mg total) by mouth daily.   No facility-administered encounter medications on file as of 11/26/2023.    Review of Systems:  Review of Systems  Constitutional:  Negative for activity change, appetite change and fever.  HENT:  Negative for sore throat.   Eyes: Negative.   Cardiovascular:  Negative for chest pain and leg swelling.  Gastrointestinal:  Negative for abdominal distention, diarrhea and vomiting.  Genitourinary:  Negative for dysuria, frequency and urgency.  Skin:  Negative for color change.  Neurological:  Negative for dizziness and headaches.  Psychiatric/Behavioral:  Negative for behavioral problems and sleep disturbance. The patient is not nervous/anxious.     Health Maintenance  Topic Date Due   COVID-19 Vaccine (1) Never done   Zoster Vaccines- Shingrix (1 of 2) Never done   OPHTHALMOLOGY EXAM  07/01/2015   Diabetic kidney evaluation - Urine ACR  10/18/2015   Colonoscopy  12/03/2017   Pneumonia Vaccine 12+ Years old (2 of 2 - PCV) 12/26/2023 (Originally 05/26/2010)   FOOT EXAM  12/21/2023   HEMOGLOBIN A1C  02/21/2024   INFLUENZA VACCINE  02/28/2024   Medicare Annual Wellness (AWV)  05/23/2024   LIPID PANEL  11/21/2024   Diabetic kidney evaluation - eGFR measurement  11/24/2024   Hepatitis C Screening  Completed   HPV VACCINES  Aged Out   Meningococcal B Vaccine  Aged Out   DTaP/Tdap/Td  Discontinued    Physical Exam: Vitals:   11/26/23 1134  BP: (!) 142/70  Pulse: 70  Temp: (!) 97.5 F (36.4 C)  SpO2: 97%  Weight: 164 lb 6.4 oz (74.6 kg)  Height: 5\' 7"  (1.702 m)   Body mass index is 25.75 kg/m. Physical Exam Constitutional:      Appearance: Normal appearance.  HENT:     Head: Normocephalic and atraumatic.     Mouth/Throat:     Mouth: Mucous membranes are moist.  Eyes:      Conjunctiva/sclera: Conjunctivae normal.  Cardiovascular:     Rate and Rhythm: Normal rate and regular rhythm.     Pulses: Normal pulses.     Heart sounds: Normal heart sounds.  Pulmonary:     Effort: Pulmonary effort is normal.     Breath sounds: Normal breath sounds.  Abdominal:     General: Bowel sounds are normal.     Palpations: Abdomen is soft.  Musculoskeletal:        General: No swelling.     Cervical back: Normal range of motion.  Skin:    General: Skin is warm and dry.     Comments: Toenails are long, thick and brownish  Neurological:  Mental Status: He is alert.  Psychiatric:        Mood and Affect: Mood normal.        Behavior: Behavior normal.     Labs reviewed: Basic Metabolic Panel: Recent Labs    08/27/23 1313 10/15/23 1232 11/21/23 1820 11/22/23 0128 11/23/23 0936 11/24/23 0540 11/25/23 0423  NA 136 137   < > 144 142 139 136  K 3.8 3.5   < > 3.4* 3.4* 3.4* 4.4  CL 105 108   < > 111 110 109 107  CO2 22 22   < > 16* 20* 19* 18*  GLUCOSE 98 104*   < > 122* 104* 87 96  BUN 66* 66*   < > 94* 93* 89* 84*  CREATININE 4.44* 4.33*   < > 5.10* 5.38* 5.19* 5.38*  CALCIUM  8.4*  8.4* 8.0*  8.0*   < > 8.0* 7.8* 7.5* 7.6*  MG  --   --   --  1.7 1.9  --   --   PHOS 3.1 3.8  --  4.6  --   --   --   TSH  --   --   --  4.134  --   --   --    < > = values in this interval not displayed.   Liver Function Tests: Recent Labs    12/21/22 1418 02/18/23 0000 08/27/23 1313 10/15/23 1232 11/22/23 0128  AST 17  --   --   --  18  ALT 15  --   --   --  11  ALKPHOS  --   --   --   --  79  BILITOT 0.5  --   --   --  1.0  PROT 6.5  --   --   --  6.8  ALBUMIN  --    < > 3.2* 3.2* 3.2*   < > = values in this interval not displayed.   No results for input(s): "LIPASE", "AMYLASE" in the last 8760 hours. No results for input(s): "AMMONIA" in the last 8760 hours. CBC: Recent Labs    12/21/22 1418 02/18/23 0000 05/01/23 0000 11/21/23 1820 11/22/23 0128  11/25/23 0423  WBC 4.6 4.2  --  4.6 4.3 4.5  NEUTROABS 2,300 1.70  --  3.3  --   --   HGB 8.5* 8.4*   < > 10.7* 10.6* 9.1*  HCT 27.0* 26*  --  34.7* 34.9* 29.4*  MCV 84.1  --   --  85.0 85.7 84.0  PLT 153 155  --  153 167 158   < > = values in this interval not displayed.   Lipid Panel: Recent Labs    12/21/22 1418 11/22/23 0128  CHOL 169 156  HDL 67 58  LDLCALC 86 84  TRIG 74 68  CHOLHDL 2.5 2.7   Lab Results  Component Value Date   HGBA1C 5.4 11/22/2023    Procedures since last visit: MR ANGIO HEAD WO CONTRAST Result Date: 11/24/2023 CLINICAL DATA:  Stroke, follow-up. Acute lacunar infarct of the right cerebellum. EXAM: MRA HEAD WITHOUT CONTRAST TECHNIQUE: Angiographic images of the Circle of Willis were acquired using MRA technique without intravenous contrast. COMPARISON:  MR head without contrast 11/22/2023 FINDINGS: Anterior circulation: High cervical segments are within normal limits bilaterally. 50% stenosis is present within the left ophthalmic segment. Moderate high-grade focal stenosis is present in the supraclinoid right ICA. The ICA termini are within limits bilaterally. The right A1 segment is dominant.  Mild narrowing is present the distal left M1 segment. Moderate to high-grade stenoses are present in the proximal posterior left M2 segments. More distal segmental narrowing is present in the right MCA branch vessels. Moderate segmental stenoses are present in the A2 segments bilaterally. Moderate stenosis is present in the proximal right pericallosal artery. Posterior circulation: The to left vertebral artery dominant. High-grade stenoses or occlusion are present in the distal right V4 segment. Proximal basilar artery is somewhat irregular. Moderate to high-grade stenosis is present in the more distal basilar artery just below the superior cerebellar arteries. The superior cerebellar arteries are patent bilaterally. Moderate stenosis is present in the distal left P2 segment.  Right posterior cerebral artery is of fetal type. Moderate stenosis is present in the proximal right P2 segment. Mild attenuation of distal PCA branch vessels is present bilaterally. Anatomic variants: Fetal type right posterior cerebral artery. Other: None. IMPRESSION: 1. High-grade stenoses or occlusion in the distal right V4 segment. 2. Moderate to high-grade stenoses in the proximal posterior left M2 segments. 3. Moderate to high-grade stenosis in the more distal basilar artery just below the superior cerebellar arteries. 4. Moderate stenosis in the distal left P2 segment. 5. Moderate stenosis in the proximal right P2 segment. 6. Moderate segmental stenoses in the A2 segments bilaterally. 7. Moderate stenosis in the proximal right pericallosal artery. 8. 50% stenosis in the left ophthalmic segment. 9. Moderate high-grade focal stenosis in the supraclinoid right ICA. Electronically Signed   By: Audree Leas M.D.   On: 11/24/2023 18:41   VAS US  CAROTID (at [facility] and [facility] only) Result Date: 11/22/2023 Carotid Arterial Duplex Study Patient Name:  TAVAREZ AFONSO  Date of Exam:   11/22/2023 Medical Rec #: [id number]     Accession #:    [id number] Date of Birth: [birth year]      Patient Gender: M Patient Age:   28 years Exam Location:  [facility] Procedure:      VAS US  CAROTID Referring Phys: Vada Garibaldi --------------------------------------------------------------------------------  Indications:       TIA and Onset headache. Risk Factors:      Hypertension, Diabetes. Other Factors:     H/O stroke, A-fid on Eliquis . Limitations        Today's exam was limited due to Extremely tortuous vessels. Comparison Study:  Previous study on 4.23.2017. Performing Technologist: Ria Chad  Examination Guidelines: A complete evaluation includes B-mode imaging, spectral Doppler, color Doppler, and power Doppler as needed of all accessible portions of each vessel. Bilateral testing is considered an integral part of  a complete examination. Limited examinations for reoccurring indications may be performed as noted.  Right Carotid Findings: +----------+--------+--------+--------+------------------+--------+           PSV cm/sEDV cm/sStenosisPlaque DescriptionComments +----------+--------+--------+--------+------------------+--------+ CCA Prox  47      6                                          +----------+--------+--------+--------+------------------+--------+ CCA Distal49      15                                         +----------+--------+--------+--------+------------------+--------+ ICA Prox  42      18              calcific                   +----------+--------+--------+--------+------------------+--------+  ICA Mid   86      31                                         +----------+--------+--------+--------+------------------+--------+ ICA Distal52      23                                         +----------+--------+--------+--------+------------------+--------+ ECA       81                                                 +----------+--------+--------+--------+------------------+--------+ +----------+--------+-------+--------+-------------------+           PSV cm/sEDV cmsDescribeArm Pressure (mmHG) +----------+--------+-------+--------+-------------------+ Subclavian170                                        +----------+--------+-------+--------+-------------------+ +---------+--------+--+--------+--+ VertebralPSV cm/s85EDV cm/s32 +---------+--------+--+--------+--+  Left Carotid Findings: +----------+--------+--------+--------+------------------+--------+           PSV cm/sEDV cm/sStenosisPlaque DescriptionComments +----------+--------+--------+--------+------------------+--------+ CCA Prox  46      6                                          +----------+--------+--------+--------+------------------+--------+ CCA Distal39      7                                           +----------+--------+--------+--------+------------------+--------+ ICA Prox  49      17              calcific                   +----------+--------+--------+--------+------------------+--------+ ICA Mid   50      16                                         +----------+--------+--------+--------+------------------+--------+ ICA Distal47      18                                         +----------+--------+--------+--------+------------------+--------+ ECA       65      5                                          +----------+--------+--------+--------+------------------+--------+ +----------+--------+--------+--------+-------------------+           PSV cm/sEDV cm/sDescribeArm Pressure (mmHG) +----------+--------+--------+--------+-------------------+ Subclavian132                                         +----------+--------+--------+--------+-------------------+ +---------+--------+--+--------+--+  VertebralPSV cm/s45EDV cm/s13 +---------+--------+--+--------+--+   Summary: Right Carotid: Velocities in the right ICA are consistent with a 1-39% stenosis. Left Carotid: Velocities in the left ICA are consistent with a 1-39% stenosis. Vertebrals:  Bilateral vertebral arteries demonstrate antegrade flow. Subclavians: Normal flow hemodynamics were seen in bilateral subclavian              arteries. *See table(s) above for measurements and observations.  Electronically signed by Jimmye Moulds MD on 11/22/2023 at 12:08:48 PM.    Final    ECHOCARDIOGRAM COMPLETE Result Date: 11/22/2023    ECHOCARDIOGRAM REPORT   Patient Name:   Patrick [name] Date of Exam: 11/22/2023 Medical Rec #:  [id number]    Height:       67.0 in Accession #:    [id number]   Weight:       175.5 lb Date of Birth:  03-14-1946     BSA:          1.913 m Patient Age:    77 years     BP:           187/92 mmHg Patient Gender: M            HR:           69 bpm. Exam Location:  Inpatient  Procedure: 2D Echo (Both Spectral and Color Flow Doppler were utilized during            procedure). Indications:    elevated troponin  History:        Patient has prior history of Echocardiogram examinations, most                 recent 08/03/2022. Chronic kidney disease, Arrythmias:Atrial                 Fibrillation and PVC; Risk Factors:Hypertension, Dyslipidemia,                 Diabetes and Sleep Apnea.  Sonographer:    Dione Franks RDCS Referring Phys: [id number] SARA-MAIZ A THOMAS IMPRESSIONS  1. Left ventricular ejection fraction, by estimation, is 55 to 60%. The left ventricle has normal function. The left ventricle has no regional wall motion abnormalities. There is severe concentric left ventricular hypertrophy. Left ventricular diastolic  parameters are indeterminate.  2. Right ventricular systolic function is normal. The right ventricular size is normal. There is moderately elevated pulmonary artery systolic pressure. The estimated right ventricular systolic pressure is 59.8 mmHg.  3. Left atrial size was severely dilated.  4. Right atrial size was moderately dilated.  5. A small pericardial effusion is present. The pericardial effusion is posterior and lateral to the left ventricle. There is no evidence of cardiac tamponade.  6. The mitral valve is degenerative. Mild mitral valve regurgitation. No evidence of mitral stenosis. Moderate mitral annular calcification.  7. The aortic valve is tricuspid. There is mild calcification of the aortic valve. Aortic valve regurgitation is mild. No aortic stenosis is present.  8. Aortic dilatation noted. There is borderline dilatation of the aortic root, measuring 38 mm.  9. The inferior vena cava is normal in size with <50% respiratory variability, suggesting right atrial pressure of 8 mmHg. FINDINGS  Left Ventricle: Left ventricular ejection fraction, by estimation, is 55 to 60%. The left ventricle has normal function. The left ventricle has no regional wall  motion abnormalities. The left ventricular internal cavity size was normal in size. There is  severe concentric left ventricular hypertrophy. Left ventricular diastolic parameters are indeterminate. Right  Ventricle: The right ventricular size is normal. No increase in right ventricular wall thickness. Right ventricular systolic function is normal. There is moderately elevated pulmonary artery systolic pressure. The tricuspid regurgitant velocity is 3.70 m/s, and with an assumed right atrial pressure of 5 mmHg, the estimated right ventricular systolic pressure is 59.8 mmHg. Left Atrium: Left atrial size was severely dilated. Right Atrium: Right atrial size was moderately dilated. Pericardium: A small pericardial effusion is present. The pericardial effusion is posterior and lateral to the left ventricle. There is no evidence of cardiac tamponade. Mitral Valve: The mitral valve is degenerative in appearance. There is mild calcification of the mitral valve leaflet(s). Moderate mitral annular calcification. Mild mitral valve regurgitation. No evidence of mitral valve stenosis. Tricuspid Valve: The tricuspid valve is normal in structure. Tricuspid valve regurgitation is trivial. No evidence of tricuspid stenosis. Aortic Valve: The aortic valve is tricuspid. There is mild calcification of the aortic valve. Aortic valve regurgitation is mild. Aortic regurgitation PHT measures 449 msec. No aortic stenosis is present. Pulmonic Valve: The pulmonic valve was normal in structure. Pulmonic valve regurgitation is trivial. No evidence of pulmonic stenosis. Aorta: Aortic dilatation noted. There is borderline dilatation of the aortic root, measuring 38 mm. Venous: The inferior vena cava is normal in size with less than 50% respiratory variability, suggesting right atrial pressure of 8 mmHg. IAS/Shunts: No atrial level shunt detected by color flow Doppler.  LEFT VENTRICLE PLAX 2D LVIDd:         4.60 cm   Diastology LVIDs:          3.10 cm   LV e' lateral: 6.42 cm/s LV PW:         1.60 cm LV IVS:        1.50 cm LVOT diam:     1.90 cm LV SV:         43 LV SV Index:   22 LVOT Area:     2.84 cm  RIGHT VENTRICLE            IVC RV S prime:     7.94 cm/s  IVC diam: 1.50 cm TAPSE (M-mode): 0.8 cm LEFT ATRIUM             Index        RIGHT ATRIUM           Index LA diam:        4.10 cm 2.14 cm/m   RA Area:     21.00 cm LA Vol (A2C):   80.0 ml 41.82 ml/m  RA Volume:   63.20 ml  33.04 ml/m LA Vol (A4C):   95.1 ml 49.71 ml/m LA Biplane Vol: 92.1 ml 48.15 ml/m  AORTIC VALVE LVOT Vmax:   77.90 cm/s LVOT Vmean:  49.900 cm/s LVOT VTI:    0.150 m AI PHT:      449 msec  AORTA Ao Root diam: 3.80 cm Ao Asc diam:  3.30 cm TRICUSPID VALVE TR Peak grad:   54.8 mmHg TR Vmax:        370.00 cm/s  SHUNTS Systemic VTI:  0.15 m Systemic Diam: 1.90 cm Jules Oar MD Electronically signed by Jules Oar MD Signature Date/Time: 11/22/2023/11:16:48 AM    Final    MR BRAIN WO CONTRAST Result Date: 11/22/2023 CLINICAL DATA:  Headache EXAM: MRI HEAD WITHOUT CONTRAST TECHNIQUE: Multiplanar, multiecho pulse sequences of the brain and surrounding structures were obtained without intravenous contrast. COMPARISON:  11/21/2015 FINDINGS: Brain: Small focus of acute ischemia in  the right cerebellum. Hemosiderin deposition in the right cerebellum at the site of old infarct. Multiple chronic microhemorrhages in a predominantly peripheral distribution. Bihemispheric subarachnoid siderosis. There is multifocal hyperintense T2-weighted signal within the white matter. Generalized volume loss. Old right frontal and occipital infarcts. The midline structures are normal. Vascular: Normal flow voids. Skull and upper cervical spine: Normal calvarium and skull base. Visualized upper cervical spine and soft tissues are normal. Sinuses/Orbits:No paranasal sinus fluid levels or advanced mucosal thickening. No mastoid or middle ear effusion. Normal orbits. IMPRESSION: 1. Small focus  of acute ischemia in the right cerebellum. No hemorrhage or mass effect. 2. Multiple chronic microhemorrhages in a predominantly peripheral distribution, which may indicate cerebral amyloid angiopathy. 3. Old right frontal and occipital infarcts. Electronically Signed   By: Juanetta Nordmann M.D.   On: 11/22/2023 00:34   DG Chest Port 1 View Result Date: 11/21/2023 CLINICAL DATA:  Weakness. EXAM: PORTABLE CHEST 1 VIEW COMPARISON:  Chest radiograph dated Dec 08, 2015. FINDINGS: Stable cardiomegaly. Aortic atherosclerosis. No focal consolidation, pleural effusion, or pneumothorax. No acute osseous abnormality. IMPRESSION: 1. No acute cardiopulmonary findings. 2. Stable cardiomegaly. Electronically Signed   By: Mannie Seek M.D.   On: 11/21/2023 19:52   CT Head Wo Contrast Result Date: 11/21/2023 CLINICAL DATA:  Headache EXAM: CT HEAD WITHOUT CONTRAST TECHNIQUE: Contiguous axial images were obtained from the base of the skull through the vertex without intravenous contrast. RADIATION DOSE REDUCTION: This exam was performed according to the departmental dose-optimization program which includes automated exposure control, adjustment of the mA and/or kV according to patient size and/or use of iterative reconstruction technique. COMPARISON:  Head CT 01/30/2016. FINDINGS: Brain: No evidence of acute infarction, hemorrhage, hydrocephalus, or acute extra-axial collection or mass lesion/mass effect. Bifrontal low-density subdural fluid collections are unchanged from the prior study. Left Vascular: Atherosclerotic calcifications are present within the cavernous internal carotid arteries. Skull: Normal. Negative for fracture or focal lesion. Sinuses/Orbits: No acute finding. Other: None. IMPRESSION: 1. No acute intracranial process. 2. 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 Electronically Signed   By: Tyron Gallon M.D.   On: 11/21/2023 19:47     Assessment/Plan  1. History of stroke (Primary) -  Recent embolic CVA due to atrial fibrillation and poor anticoagulation compliance. - Refer to neurologist for follow-up care. - Ambulatory referral to Home Health for PT and  OT for therapeutic strengthening exercises,  Nurse for medication management and Aide for ADL assistance - CBC with Differential/Platelets - Complete Metabolic Panel with eGFR - Ambulatory referral to Neurology - For home use only DME Other see comment  2. Permanent atrial fibrillation (HCC) -  Managed with Eliquis . Recent embolic CVA due to poor compliance, possibly dementia-related. Increased bleeding risk noted. -  continue Eliquis  and Coreg  - Refer to cardiologist for management. - will need echocardiogram in 4-6 weeks. - urgent Ambulatory referral to Cardiology  3. Normocytic anemia Lab Results  Component Value Date   HGB 9.1 (L) 11/25/2023   -  will re-check CBC today  4. Benign hypertension with chronic kidney disease -  Managed with carvedilol , hydralazine , and isosorbide mononitrate. BP 142/70 mmHg. Recent medication adjustments noted. - Monitor blood pressure daily and bring log to next appointment. - Prescribe blood pressure monitoring device, insurance coverage uncertain. - For home use only DME Other see comment  5. Stage 5 chronic kidney disease not on chronic dialysis (HCC) -  GFR 10. Potential dialysis need discussed. -  Refer to nephrologist for management. - Ambulatory referral to Nephrology  6. Hyperlipidemia, unspecified hyperlipidemia type Lab Results  Component Value Date   CHOL 156 11/22/2023   HDL 58 11/22/2023   LDLCALC 84 11/22/2023   TRIG 68 11/22/2023   CHOLHDL 2.7 11/22/2023    -  Managed with atorvastatin . Recent lipid panel normal.  7. Hypokalemia Lab Results  Component Value Date   K 4.4 11/25/2023    -  repleted in the hospital -  will monitor  8. Chronic idiopathic gout involving toe of right foot  without tophus -  Managed with allopurinol .  9. Moderate vascular dementia without behavioral disturbance, psychotic disturbance, mood disturbance, or anxiety (HCC) -  continue supportive care -  fall precautions - referred for Home health Speech therapy for cognitive exercises  10. Onychomycosis -  has long thick and brownish toenails - Ambulatory referral to Podiatry      Follow-up Follow-up in one month for reassessment. - Schedule follow-up appointment in one month. - Instruct to return sooner if any acute symptoms develop.   Labs/tests ordered:   CBC and CMP   Return in about 4 weeks (around 12/24/2023), or if symptoms worsen or fail to improve.  Greydis Stlouis [name], NP

## 2023-11-26 NOTE — Telephone Encounter (Signed)
Noted.  Thank you for the feedback.

## 2023-11-26 NOTE — Transitions of Care (Post Inpatient/ED Visit) (Signed)
   11/26/2023  Name: Patrick [name] MRN: [id number] DOB: 1946-04-26  Today's TOC FU Call Status: Today's TOC FU Call Status:: Unsuccessful Call (1st Attempt) Unsuccessful Call (1st Attempt) Date: 11/26/23  Attempted to reach the patient regarding the most recent Inpatient/ED visit.  Follow Up Plan: Additional outreach attempts will be made to reach the patient to complete the Transitions of Care (Post Inpatient/ED visit) call.   Tonia Frankel RN, CCM [place]  VBCI-Population Health RN Care Manager [phone]

## 2023-11-26 NOTE — Telephone Encounter (Signed)
 Patient was seen today at [facility], 11/26/23. Discussed medications, labs, DME and referrals with sons and patient. FYI.

## 2023-11-27 ENCOUNTER — Telehealth: Payer: Self-pay

## 2023-11-27 ENCOUNTER — Encounter (HOSPITAL_COMMUNITY): Payer: Self-pay

## 2023-11-27 DIAGNOSIS — I161 Hypertensive emergency: Secondary | ICD-10-CM

## 2023-11-27 LAB — COMPLETE METABOLIC PANEL WITHOUT GFR
AG Ratio: 1.2 (calc) (ref 1.0–2.5)
ALT: 9 U/L (ref 9–46)
AST: 16 U/L (ref 10–35)
Albumin: 3.4 g/dL — ABNORMAL LOW (ref 3.6–5.1)
Alkaline phosphatase (APISO): 87 U/L (ref 35–144)
BUN/Creatinine Ratio: 15 (calc) (ref 6–22)
BUN: 86 mg/dL — ABNORMAL HIGH (ref 7–25)
CO2: 20 mmol/L (ref 20–32)
Calcium: 7.9 mg/dL — ABNORMAL LOW (ref 8.6–10.3)
Chloride: 107 mmol/L (ref 98–110)
Creat: 5.61 mg/dL — ABNORMAL HIGH (ref 0.70–1.28)
Globulin: 2.8 g/dL (ref 1.9–3.7)
Glucose, Bld: 145 mg/dL — ABNORMAL HIGH (ref 65–139)
Potassium: 4.7 mmol/L (ref 3.5–5.3)
Sodium: 138 mmol/L (ref 135–146)
Total Bilirubin: 0.4 mg/dL (ref 0.2–1.2)
Total Protein: 6.2 g/dL (ref 6.1–8.1)

## 2023-11-27 LAB — CBC WITH DIFFERENTIAL/PLATELET
Absolute Lymphocytes: 640 {cells}/uL — ABNORMAL LOW (ref 850–3900)
Absolute Monocytes: 328 {cells}/uL (ref 200–950)
Basophils Absolute: 41 {cells}/uL (ref 0–200)
Basophils Relative: 1 %
Eosinophils Absolute: 947 {cells}/uL — ABNORMAL HIGH (ref 15–500)
Eosinophils Relative: 23.1 %
HCT: 29.4 % — ABNORMAL LOW (ref 38.5–50.0)
Hemoglobin: 9.1 g/dL — ABNORMAL LOW (ref 13.2–17.1)
MCH: 26.1 pg — ABNORMAL LOW (ref 27.0–33.0)
MCHC: 31 g/dL — ABNORMAL LOW (ref 32.0–36.0)
MCV: 84.2 fL (ref 80.0–100.0)
MPV: 11.5 fL (ref 7.5–12.5)
Monocytes Relative: 8 %
Neutro Abs: 2144 {cells}/uL (ref 1500–7800)
Neutrophils Relative %: 52.3 %
Platelets: 168 10*3/uL (ref 140–400)
RBC: 3.49 10*6/uL — ABNORMAL LOW (ref 4.20–5.80)
RDW: 14.8 % (ref 11.0–15.0)
Total Lymphocyte: 15.6 %
WBC: 4.1 10*3/uL (ref 3.8–10.8)

## 2023-11-27 NOTE — Telephone Encounter (Signed)
 Called and gave verbal orders to [facility].

## 2023-11-27 NOTE — Telephone Encounter (Signed)
 This will be fine, starting Home health on 11/30/23. Pls tell them to call family and patient.

## 2023-11-27 NOTE — Progress Notes (Signed)
 He has hgb 9.1, same as 2 days ago, stable, likely due to chronic kidney disease -  creatinine 5.61, calculated GFR 10, ranging as chronic kidney disease stage 5, was referred to nephrology -

## 2023-11-27 NOTE — Transitions of Care (Post Inpatient/ED Visit) (Signed)
 11/27/2023  Name: Patrick [name] MRN: [id number] DOB: [birth year]  Today's TOC FU Call Status: Today's TOC FU Call Status:: Successful TOC FU Call Completed TOC FU Call Complete Date: 11/27/23 Patient's Name and Date of Birth confirmed.  Transition Care Management Follow-up Telephone Call How have you been since you were released from the hospital?: Better Any questions or concerns?: Yes Patient Questions/Concerns:: son, Carolyn Cisco asking about Home Health versus custodial care (education provided; SW referral completed)  Items Reviewed: Did you receive and understand the discharge instructions provided?: Yes Medications obtained,verified, and reconciled?: Yes (Medications Reviewed) Any new allergies since your discharge?: No Dietary orders reviewed?: Yes Type of Diet Ordered:: low sodium heart healthy Do you have support at home?: Yes People in Home [RPT]: spouse Name of Support/Comfort Primary Source: patient lives with wife and son, Carolyn Cisco - has 3 other sons and daughter in laws who can help as needed  Medications Reviewed Today: Medications Reviewed Today     Reviewed by Sharmaine Dearth, RN (Registered Nurse) on 11/27/23 at 1252  Med List Status: <None>   Medication Order Taking? Sig Documenting Provider Last Dose Status Informant  allopurinol  (ZYLOPRIM ) 100 MG tablet [id number] Yes Take 0.5 tablets (50 mg total) by mouth every other day. Lillia Reilly, MD Taking Active   amLODipine  (NORVASC ) 10 MG tablet [id number] Yes TAKE ONE TABLET BY MOUTH ONE TIME DAILY [name], [name], NP Taking Active   apixaban  (ELIQUIS ) 2.5 MG TABS tablet [id number] Yes Take 1 tablet (2.5 mg total) by mouth 2 (two) times daily. Burton Casey, MD Taking Active   atorvastatin  (LIPITOR) 20 MG tablet [id number] Yes Take 1 tablet (20 mg total) by mouth daily. Burton Casey, MD Taking Active   calcitRIOL (ROCALTROL) 0.25 MCG capsule [id number] Yes Take 0.25 mcg by mouth daily. [provider] Taking Active   carvedilol  (COREG ) 12.5 MG tablet [id number] Yes Take 1 tablet (12.5 mg total) by mouth 2 (two) times daily with a meal. Ghimire, Estil Heman, MD Taking Active   hydrALAZINE  (APRESOLINE ) 100 MG tablet [id number] Yes Take 1 tablet (100 mg total) by mouth 3 (three) times daily. Burton Casey, MD Taking Active   isosorbide mononitrate (IMDUR) 30 MG 24 hr tablet [id number] Yes Take 1 tablet (30 mg total) by mouth daily. Burton Casey, MD Taking Active             Home Care and Equipment/Supplies: Were Home Health Services Ordered?: Yes [facility] of [facility] Has Agency set up a time to come to your home?: Yes First Home Health Visit Date: 11/30/23 (At the end of our conversation, son put [facility] RN on hold and spoke with Home health who will come 11/30/23 as requested by son) EMR reviewed for Home Health Orders:  (Son, Carolyn Cisco states he has spoken with Home Health and is awaiting a scheduled visit) Any new equipment or medical supplies ordered?: Yes Name of Medical supply agency?: Patient home from hospital with walker Were you able to get the equipment/medical supplies?: Yes Do you have any questions related to the use of the equipment/supplies?: No  Functional Questionnaire: Do you need assistance with bathing/showering or dressing?: Yes (son states patient needs assistance getting in and out of shower and has shower chair) Do you need assistance with meal preparation?: Yes (Son states patient needs assistance with meals and son/daughter in law are doing this) Do you need assistance with eating?: No Do you have difficulty maintaining  continence: No Do you need assistance with getting out of bed/getting out of a chair/moving?: No Do you have difficulty managing or taking your medications?: Yes (son states he is helping patient with medications and son's wife will pick up pill box)  Follow up appointments reviewed: PCP Follow-up  appointment confirmed?: Yes Date of PCP follow-up appointment?: 11/26/23 Follow-up Provider: Duncan Gibson NP Specialist Hospital Follow-up appointment confirmed?: No Reason Specialist Follow-Up Not Confirmed:  (son, Carolyn Cisco was given number and he wants to discuss with his other brother and call at a time they can get the patient to the appointment) Do you need transportation to your follow-up appointment?: No Do you understand care options if your condition(s) worsen?: Yes-patient verbalized understanding  SDOH Interventions Today    Flowsheet Row Most Recent Value  SDOH Interventions   Food Insecurity Interventions Intervention Not Indicated  Housing Interventions Intervention Not Indicated  Transportation Interventions Intervention Not Indicated  Utilities Interventions Intervention Not Indicated       Goals Addressed             This Visit's Progress    VBCI Transitions of Care (TOC) Care Plan       Problems:  Recent Hospitalization for treatment of  Hypertensive Emergency, Stroke Son, Carolyn Cisco requested SW referral for assistance with getting custodial care  Goal:  Over the next 30 days, the patient will not experience hospital readmission  Interventions:  Transitions of Care: Doctor Visits  - discussed the importance of doctor visits TOC RN educated on difference between custodial care versus Home Health and made SW referral  Reviewed all medications and importance of checking BP with the recent BP medication changes and educated on risk of bleeding with Eliquis  Reviewed calling and scheduled neuro appointment (provided number) and cardiology appointment  Hypertension Interventions: Last practice recorded BP readings:  BP Readings from Last 3 Encounters:  11/26/23 (!) 142/70  11/25/23 126/74  10/29/23 (!) 178/89   Most recent eGFR/CrCl:  Lab Results  Component Value Date   EGFR 12 06/18/2023    No components found for: "CRCL"  Reviewed medications  with patient and discussed importance of compliance Advised patient, providing education and rationale, to monitor blood pressure daily and record, calling PCP for findings outside established parameters  Stroke: Reviewed Importance of taking all medications as prescribed Reviewed Importance of attending all scheduled provider appointments Assessed social determinant of health barriers Assessed for signs and symptoms of stroke Reviewed referrals to home health, son Carolyn Cisco) states home health called at the end of our conversation and said they will be out Saturday   Patient Self Care Activities:  Attend all scheduled provider appointments Call pharmacy for medication refills 3-7 days in advance of running out of medications Call provider office for new concerns or questions  Notify RN Care Manager of [facility] call rescheduling needs Participate in Transition of Care Program/Attend TOC scheduled calls Take medications as prescribed   check blood pressure daily keep a blood pressure log take blood pressure log to all doctor appointments call doctor for signs and symptoms of high blood pressure  Plan:  Telephone follow up appointment with care management team member scheduled for:  12/05/23 10am The patient has been provided with contact information for the care management team and has been advised to call with any health related questions or concerns.         Tonia Frankel RN, CCM [place]  VBCI-Population Health RN Care Manager [phone]

## 2023-11-27 NOTE — Patient Instructions (Signed)
 Visit Information  Thank you for taking time to visit with me today. Please don't hesitate to contact me if I can be of assistance to you before our next scheduled telephone appointment.  Our next appointment is by telephone on 12/05/23 at 10am  Following is a copy of your care plan:   Goals Addressed             This Visit's Progress    VBCI Transitions of Care (TOC) Care Plan       Problems:  Recent Hospitalization for treatment of  Hypertensive Emergency, Stroke Son, Carolyn Cisco requested SW referral for assistance with getting custodial care  Goal:  Over the next 30 days, the patient will not experience hospital readmission  Interventions:  Transitions of Care: Doctor Visits  - discussed the importance of doctor visits TOC RN educated on difference between custodial care versus Home Health and made SW referral  Reviewed all medications and importance of checking BP with the recent BP medication changes and educated on risk of bleeding with Eliquis  Reviewed calling and scheduled neuro appointment (provided number) and cardiology appointment  Hypertension Interventions: Last practice recorded BP readings:  BP Readings from Last 3 Encounters:  11/26/23 (!) 142/70  11/25/23 126/74  10/29/23 (!) 178/89   Most recent eGFR/CrCl:  Lab Results  Component Value Date   EGFR 12 06/18/2023    No components found for: "CRCL"  Reviewed medications with patient and discussed importance of compliance Advised patient, providing education and rationale, to monitor blood pressure daily and record, calling PCP for findings outside established parameters  Stroke: Reviewed Importance of taking all medications as prescribed Reviewed Importance of attending all scheduled provider appointments Assessed social determinant of health barriers Assessed for signs and symptoms of stroke Reviewed referrals to home health, son Carolyn Cisco) states home health called at the end of our conversation and said they  will be out Saturday   Patient Self Care Activities:  Attend all scheduled provider appointments Call pharmacy for medication refills 3-7 days in advance of running out of medications Call provider office for new concerns or questions  Notify RN Care Manager of [facility] call rescheduling needs Participate in Transition of Care Program/Attend TOC scheduled calls Take medications as prescribed   check blood pressure daily keep a blood pressure log take blood pressure log to all doctor appointments call doctor for signs and symptoms of high blood pressure  Plan:  Telephone follow up appointment with care management team member scheduled for:  12/05/23 10am The patient has been provided with contact information for the care management team and has been advised to call with any health related questions or concerns.         Patient verbalizes understanding of instructions and care plan provided today and agrees to view in MyChart. Active MyChart status and patient understanding of how to access instructions and care plan via MyChart confirmed with patient.     Telephone follow up appointment with care management team member scheduled for:12/05/23 10am The patient has been provided with contact information for the care management team and has been advised to call with any health related questions or concerns.   Please call the care guide team at [phone] if you need to cancel or reschedule your appointment.   Please call the Suicide and Crisis Lifeline: 988 call the [place]  National Suicide Prevention Lifeline: (203) 160-9104 or TTY: 4021462389 TTY 361 283 3923) to talk to a trained counselor call 911 if you are experiencing a Mental Health or Behavioral  Health Crisis or need someone to talk to. Tonia Frankel RN, CCM [place]  VBCI-Population Health RN Care Manager [phone]

## 2023-11-27 NOTE — Telephone Encounter (Signed)
 Let me know if this is Ok? I will call back and give verbal orders. Message routed to PCP [name], [name], NP

## 2023-11-27 NOTE — Telephone Encounter (Signed)
 Copied from CRM [id number]. Topic: Clinical - Home Health Verbal Orders >> Nov 27, 2023  3:36 PM Danelle Dunning F wrote: What is the name of the person calling?   Janalyn Me  Where are you calling from?  [facility]    Information concerning patient: They received a referral for physical therapy and home health assistance and the patient would like for them to come on 5/3. They are looking to confirm whether that is ok. Janalyn Me states she is open to being given permission verbally over the phone utilizing her direct number provided below.    Contact information:  Ph: [phone]

## 2023-11-28 ENCOUNTER — Telehealth: Payer: Self-pay | Admitting: *Deleted

## 2023-11-28 NOTE — Progress Notes (Signed)
 Complex Care Management Note  Care Guide Note 11/28/2023 Name: Patrick [name] MRN: [id number] DOB: 1946/06/05  Patrick [name] is a 78 y.o. year old male who sees [name], [name], NP for primary care. I reached out to Runell Countryman by phone today to offer complex care management services.  Patrick [name] was given information about Complex Care Management services today including:   The Complex Care Management services include support from the care team which includes your Nurse Care Manager, Clinical Social Worker, or Pharmacist.  The Complex Care Management team is here to help remove barriers to the health concerns and goals most important to you. Complex Care Management services are voluntary, and the patient may decline or stop services at any time by request to their care team member.   Complex Care Management Consent Status: Patient agreed to services and verbal consent obtained.   Follow up plan:  Telephone appointment with complex care management team member scheduled for:  12/04/23  Encounter Outcome:  Patient Scheduled  Barnie Bora  [facility]  [facility], [facility] Guide  Direct Dial: [phone]  Fax [phone]

## 2023-11-28 NOTE — Progress Notes (Signed)
 Complex Care Management Note Care Guide Note  11/28/2023 Name: Patrick [name] MRN: [id number] DOB: 11/04/1945   Complex Care Management Outreach Attempts: An unsuccessful telephone outreach was attempted today to offer the patient information about available complex care management services.  Follow Up Plan:  Additional outreach attempts will be made to offer the patient complex care management information and services.   Encounter Outcome:  No Answer  Barnie Bora  [facility]  [facility], [facility] Guide  Direct Dial: [phone]  Fax [phone]

## 2023-12-02 ENCOUNTER — Telehealth: Payer: Self-pay | Admitting: *Deleted

## 2023-12-02 NOTE — Telephone Encounter (Signed)
 Copied from CRM [id number]. Topic: Clinical - Home Health Verbal Orders >> Gibson Lad 5, 2025  3:33 PM Shelby Dessert H wrote: Caller/Agency: [facility] F[facility] Number: [phone] Service Requested: Physical Therapy Frequency: 2 week 4, 1 week 4 Any new concerns about the patient? No

## 2023-12-02 NOTE — Telephone Encounter (Signed)
 Gretchen with Home Health Notified, verbal orders given.

## 2023-12-04 ENCOUNTER — Telehealth: Admitting: Licensed Clinical Social Worker

## 2023-12-04 ENCOUNTER — Telehealth: Payer: Self-pay

## 2023-12-04 NOTE — Telephone Encounter (Signed)
 Copied from CRM [id number]. Topic: Clinical - Home Health Verbal Orders >> Dec 04, 2023 10:28 AM Blair Bumpers wrote: Caller/Agency: Will, Occupational Therapist, [facility] Callback Number: [phone] Service Requested: Occupational Therapy Frequency: two times a week for 3 weeks & one time a week for one week Any new concerns about the patient? No    Call returned to Will and verbal orders ok'd per standing [facility] protocol

## 2023-12-05 ENCOUNTER — Telehealth: Payer: Self-pay

## 2023-12-05 NOTE — Transitions of Care (Post Inpatient/ED Visit) (Signed)
 Transition of Care week 2  Visit Note  12/05/2023  Name: Patrick [name] MRN: [id number]          DOB: 03-13-1946  Situation: Patient enrolled in [facility] 30-day program. Visit completed with son, Jamie by telephone.   Background: Patient admitted 4/-24 - 11/25/23 Patrick [name] with diagnoses of Hypertensive emergency, Stroke (cerebrum). Per record, history of A-fib on Eliquis , CAD, stage IV CKD (follows with Dr. Higinio Love), prior CVA, vascular dementia who presented with headache, difficulty walking, presyncope-upon further evaluation-was found to have hypertensive urgency and acute right cerebellar stroke.Spoke with son, Carolyn Cisco who states Home Health PT/OT have been to home. States BP is running around 160s/80s but was 150s/80s with PT/OT and states BP typically is a little high. Son reports neurology appointment was not scheduled until July 25th and he would like patient to be seen sooner. Conference call was placed to neuro office and appointment was rescheduled for 01/02/24 - son voiced appreciation and agreed to follow up call next week   Initial Transition Care Management Follow-up Telephone Call    Past Medical History:  Diagnosis Date   Atrial fibrillation (HCC)    CAD (coronary artery disease)    Chronic anticoagulation, on coumadin  07/17/2013   CKD (chronic kidney disease), stage II    CVA (cerebral infarction) 11/22/2010   Thalamic with residual memory loss and slow speech   Depression    Diabetes mellitus type II    Insulin  dependent   GERD (gastroesophageal reflux disease)    Gout    Hyperlipidemia    Hypertension    Myocardial infarction (HCC) 03/31/1999   "near heart attack" (07/14/2013)   Paroxysmal atrial fibrillation (HCC)    on coumadin    Permanent atrial fibrillation (HCC) 07/17/2013   Shortness of breath    "can happen at any time" (07/14/2013)   Sleep apnea 07/30/2010   "has mask at home; seldom uses it" (07/14/2013)   Stomach ulcer 03/30/1949   "as a teenager"    Stroke (HCC) ~ 2007; ~ 2009   "memory not as good since" (07/14/2013)   Stroke (HCC)    Tachy-brady syndrome (HCC) 07/17/2013   Upper GI bleeding 07/01/2013    Assessment: Patient Reported Symptoms: Cognitive        Neurological Neurological Review of Symptoms: No symptoms reported Neurological Conditions: Dementia, Stroke, ischemic Neurological Management Strategies: Medication therapy Neurological Comment: Son, Curly Douglas states patient is not exhibiting any symptoms and son understands signs/symptoms and when to call  HEENT HEENT Symptoms Reported: No symptoms reported      Cardiovascular Cardiovascular Symptoms Reported: No symptoms reported Cardiovascular Conditions: Hypertension, Coronary artery disease Cardiovascular Management Strategies: Medication therapy Cardiovascular Comment: Son, Carolyn Cisco states patient doing better since he is taking his meds correctly  Respiratory Respiratory Symptoms Reported: No symptoms reported Respiratory Comment: Son reports Home Health PT/OT 99% and 100%  Endocrine Patient reports the following symptoms related to hypoglycemia or hyperglycemia : No symptoms reported    Gastrointestinal Gastrointestinal Symptoms Reported: No symptoms reported      Genitourinary Genitourinary Symptoms Reported: Frequency Additional Genitourinary Details: Son reports patient urinates frequenly Genitourinary Comment: Son plans to discuss frequency with PCP  Integumentary Integumentary Symptoms Reported: No symptoms reported    Musculoskeletal          Psychosocial           There were no vitals filed for this visit.  Medications Reviewed Today     Reviewed by Sharmaine Dearth, RN (Registered Nurse) on 12/05/23  at 0926  Med List Status: <None>   Medication Order Taking? Sig Documenting Provider Last Dose Status Informant  allopurinol  (ZYLOPRIM ) 100 MG tablet [id number] Yes Take 0.5 tablets (50 mg total) by mouth every other day. Lillia Reilly, MD Taking  Active   amLODipine  (NORVASC ) 10 MG tablet [id number] Yes TAKE ONE TABLET BY MOUTH ONE TIME DAILY [name], [name], NP Taking Active   apixaban  (ELIQUIS ) 2.5 MG TABS tablet [id number] Yes Take 1 tablet (2.5 mg total) by mouth 2 (two) times daily. Burton Casey, MD Taking Active   atorvastatin  (LIPITOR) 20 MG tablet [id number] Yes Take 1 tablet (20 mg total) by mouth daily. Burton Casey, MD Taking Active   calcitRIOL (ROCALTROL) 0.25 MCG capsule [id number] Yes Take 0.25 mcg by mouth daily. [provider] Taking Active   carvedilol  (COREG ) 12.5 MG tablet [id number] Yes Take 1 tablet (12.5 mg total) by mouth 2 (two) times daily with a meal. Ghimire, Estil Heman, MD Taking Active   hydrALAZINE  (APRESOLINE ) 100 MG tablet [id number] Yes Take 1 tablet (100 mg total) by mouth 3 (three) times daily. Burton Casey, MD Taking Active   isosorbide  mononitrate (IMDUR ) 30 MG 24 hr tablet [id number] Yes Take 1 tablet (30 mg total) by mouth daily. Ghimire, Estil Heman, MD Taking Active             Recommendation:   Specialty provider follow-up reviewed all scheduled appointments and son confirmed and states he uses portal to keep track.  Follow Up Plan:   Telephone follow up appointment date/time:  12/11/23 3pm  Tonia Frankel RN, CCM [place]  VBCI-Population Health RN Care Manager [phone]

## 2023-12-06 ENCOUNTER — Ambulatory Visit: Admitting: Podiatrist

## 2023-12-06 ENCOUNTER — Encounter: Payer: Self-pay | Admitting: Podiatrist

## 2023-12-06 VITALS — Ht 67.0 in | Wt 164.0 lb

## 2023-12-06 DIAGNOSIS — B351 Tinea unguium: Secondary | ICD-10-CM

## 2023-12-06 DIAGNOSIS — M79674 Pain in right toe(s): Secondary | ICD-10-CM

## 2023-12-06 DIAGNOSIS — M79675 Pain in left toe(s): Secondary | ICD-10-CM

## 2023-12-06 NOTE — Progress Notes (Signed)
 Subjective: Patrick [name] is a 78 y.o. male patient with history of diabetes who presents to office today with a chief concern of long,mildly painful nails  while ambulating in shoes; unable to trim.  Patient denies any new cramping, numbness, burning or tingling in the legs.  Patient denies any new changes in medication or new problems.  Referring Provider: Duncan Gibson*   Patient Active Problem List   Diagnosis Date Noted   Stroke (cerebrum) (HCC) 11/22/2023   Hypertensive emergency 11/21/2023   History of atrial fibrillation 11/16/2021   Uncontrolled type 2 diabetes mellitus with hyperglycemia (HCC) 11/16/2021   Cerebrovascular accident (CVA) due to bilateral embolism of middle cerebral arteries (HCC)    History of stroke    Systolic heart failure (HCC) 11/18/2015   Hematuria 08/30/2015   Chronic anemia 03/31/2014   Healthcare maintenance 03/31/2014   Permanent atrial fibrillation (HCC) 07/17/2013   Chronic anticoagulation, on coumadin  07/17/2013   Tachy-brady syndrome (HCC) 07/17/2013   Acute ischemic VBA thalamic stroke (HCC) 12/11/2010   Chronic kidney disease (CKD), stage IV (severe) (HCC) 12/21/2008   Coronary atherosclerosis 07/19/2006   Diabetes mellitus (HCC) 06/05/2006   Hyperlipidemia 06/05/2006   Gout 06/05/2006   Essential hypertension 06/05/2006   Sleep apnea 06/05/2006   Current Outpatient Medications on File Prior to Visit  Medication Sig Dispense Refill   allopurinol  (ZYLOPRIM ) 100 MG tablet Take 0.5 tablets (50 mg total) by mouth every other day. 30 tablet 1   amLODipine  (NORVASC ) 10 MG tablet TAKE ONE TABLET BY MOUTH ONE TIME DAILY 90 tablet 0   apixaban  (ELIQUIS ) 2.5 MG TABS tablet Take 1 tablet (2.5 mg total) by mouth 2 (two) times daily. 60 tablet 1   atorvastatin  (LIPITOR) 20 MG tablet Take 1 tablet (20 mg total) by mouth daily. 30 tablet 1   calcitRIOL (ROCALTROL) 0.25 MCG capsule Take 0.25 mcg by mouth daily.     carvedilol  (COREG ) 12.5  MG tablet Take 1 tablet (12.5 mg total) by mouth 2 (two) times daily with a meal. 60 tablet 1   hydrALAZINE  (APRESOLINE ) 100 MG tablet Take 1 tablet (100 mg total) by mouth 3 (three) times daily. 90 tablet 1   isosorbide  mononitrate (IMDUR ) 30 MG 24 hr tablet Take 1 tablet (30 mg total) by mouth daily. 30 tablet 1   No current facility-administered medications on file prior to visit.   No Known Allergies    Objective: General: Patient is awake, alert, and oriented x 3 and in no acute distress.  Integument: Skin is warm, dry and supple bilateral. Nails are tender, long, thickened and  dystrophic with subungual debris, consistent with onychomycosis, 1-5 bilateral. No signs of infection. No open lesions or preulcerative lesions present bilateral. Remaining integument unremarkable.  Vasculature:  Dorsalis Pedis pulse 1/4 bilateral. Posterior Tibial pulse  1/4 bilateral.  Capillary fill time <3 sec 1-5 bilateral. Temperature gradient within normal limits.  No varicosities present bilateral.  Moderate edema present bilateral.   Neurology: The patient has decreased sensation measured with a 5.07/10g Semmes Weinstein Monofilament at 0/5 pedal sites bilateral . Vibratory sensation diminished bilateral with tuning fork. No Babinski sign present bilateral.   Musculoskeletal: No symptomatic pedal deformities noted bilateral. Muscular strength 5/5 in all lower extremity muscular groups bilateral without pain on range of motion . No tenderness with calf compression bilateral.  Assessment and Plan: Problem List Items Addressed This Visit   None Visit Diagnoses       Pain due to onychomycosis of toenails  of both feet    -  Primary       -Examined patient. -Mechanically debrided all nails 1-5 bilateral using sterile nail nipper and filed with dremel without incident  -Answered all patient questions -Patient to return  in 3 months for at risk foot care -Patient advised to call the office if any  problems or questions arise in the meantime.  Orval Blanc, DPM

## 2023-12-10 ENCOUNTER — Encounter (HOSPITAL_COMMUNITY)

## 2023-12-10 ENCOUNTER — Telehealth: Payer: Self-pay

## 2023-12-10 ENCOUNTER — Other Ambulatory Visit: Payer: Self-pay | Admitting: Adult Health

## 2023-12-10 ENCOUNTER — Ambulatory Visit: Payer: Self-pay

## 2023-12-10 DIAGNOSIS — M109 Gout, unspecified: Secondary | ICD-10-CM

## 2023-12-10 MED ORDER — ALLOPURINOL 100 MG PO TABS: 50.0000 mg | ORAL_TABLET | ORAL | 1 refills | Status: DC

## 2023-12-10 NOTE — Transitions of Care (Post Inpatient/ED Visit) (Signed)
 12/10/2023 TOC RN received a voicemail from patient's son, Carolyn Cisco requesting a call back to discuss upcoming appointments. Call back to son who states he received a text about an appointment tomorrow and just wants to confirm upcoming appointments - TOC RN advised son that there are 2 appointments tomorrow - first with SW in the morning by phone, 2nd with this TOC RN at 3pm by phone. Son asked [facility] RN to confirm appointment with PCP 12/12/23 and [facility] RN confirmed - son states he will keep all scheduled appointments  Patient ID: Patrick [name], male   DOB: [birth year], 78 y.o.   MRN: [id number]

## 2023-12-10 NOTE — Telephone Encounter (Signed)
 Chief Complaint: foot pain Symptoms: swelling and pain Frequency: a few days Pertinent Negatives: Patient denies redness Disposition: [] ED /[] Urgent Care (no appt availability in office) / [x] Appointment(In office/virtual)/ []  [facility]/ [] Home Care/ [] Refused Recommended Disposition /[] [facility]/ [x]  Follow-up with PCP Additional Notes: HH PT states pt feet are swollen and the right more than left states that he is out of his gout medication and needing something for pain. States pt says pain in right is worse than left and both swollen. States left foot +2. States pt has been out of gout medication for about 3 weeks. States pt hasn't taken anything for the pain.   Copied from CRM [id number]. Topic: Clinical - Red Word Triage >> Dec 10, 2023 11:53 AM Tiffany H wrote: Red Word that prompted transfer to Nurse Triage: Patrick [name] w/ son Patrick [name] called to report new symptoms for patient. He has Edema in both feet. 2 plus in left. He has gout in the right foot. 8/9 out of 10 pain. He has gout medication on file. Reason for Disposition  [1] MODERATE pain (e.g., interferes with normal activities, limping) AND [2] present > 3 days  Protocols used: Foot Pain-A-AH

## 2023-12-10 NOTE — Telephone Encounter (Signed)
 I see he has appt in office on 12/12/23. I have sent eRx for Allopurinol  to his pharmacy.

## 2023-12-10 NOTE — Telephone Encounter (Signed)
Message routed to PCP [name], [name], NP  

## 2023-12-11 ENCOUNTER — Other Ambulatory Visit: Payer: Self-pay

## 2023-12-11 ENCOUNTER — Other Ambulatory Visit: Payer: Self-pay | Admitting: Licensed Clinical Social Worker

## 2023-12-11 NOTE — Patient Instructions (Signed)
 Visit Information  Thank you for taking time to visit with me today. Please don't hesitate to contact me if I can be of assistance to you before our next scheduled appointment.  Our next appointment is by telephone will be in one week Please call the care guide team at [phone] if you need to cancel or reschedule your appointment.   Following is a copy of your care plan:   Goals Addressed             This Visit's Progress    LCSW VBCI Social Work Care Plan   On track    Problems:   Level of Care Concerns:Inability to perform ADL's independently Inability to perform IADL's independently  CSW Clinical Goal(s):   Over the next 60 days the Patient will attend all scheduled medical appointments as evidenced by patient report and care team review of appointment completion in electronic MEDICAL RECORD NUMBER  work with Social Worker to address concerns related to level of care.  Interventions:  Level of Care Concerns in a patient with Atrial Fibrillation, CKD Stage 4, and DMII Current level of care: Home with other family or significant other(s): spouse  Evaluation of patient's unmet needs in current living environment ADL's Assessed needs, level of care concerns, how currently meeting needs and barriers to care Discuss community support options (LCSW discussed programs like [facility] and PACE) Discussed private pay options for personal care needs (LCSW provided listing for family to review) PACE Program (Family are not interested at this time) Problem Solving/Task Center strategies reviewed Provided list of PCS agencies and what to expect with PCS process  Facility  Patient is not interested in placement at this time  Patient Goals/Self-Care Activities:  Call Department of Social Services [phone] to apply for [facility] Medicaid. Continue taking your medication as prescribed.   Follow up on insurance options Confirm if payor has companionship hours for aid care.  Plan:    Telephone follow up appointment with care management team member scheduled for:  1 week        Please call the Suicide and Crisis Lifeline: 988 go to [facility] Urgent [facility] [address], [place] [phone]) call 911 if you are experiencing a Mental Health or Behavioral Health Crisis or need someone to talk to.  Patient verbalizes understanding of instructions and care plan provided today and agrees to view in MyChart. Active MyChart status and patient understanding of how to access instructions and care plan via MyChart confirmed with patient.     Arlis Bent [facility]  [facility], [facility] Clinical Social Worker Direct Dial: [phone]  Fax: [phone] Website: [email or web address] 10:37 AM

## 2023-12-11 NOTE — Transitions of Care (Post Inpatient/ED Visit) (Signed)
 Transition of Care week 3  Visit Note  12/11/2023  Name: Patrick [name] MRN: [id number]          DOB: 04-Sep-1945  Situation: Patient enrolled in [facility] 30-day program. Visit completed with son by telephone.   Background: Patient admitted 4/-24 - 11/25/23 Patrick [name] with diagnoses of Hypertensive emergency, Stroke (cerebrum). Per record, history of A-fib on Eliquis , CAD, stage IV CKD (follows with Dr. Higinio [name]), prior CVA, vascular dementia who presented with headache, difficulty walking, presyncope-upon further evaluation-was found to have hypertensive urgency and acute right cerebellar stroke.Spoke with son, Patrick [name] who reports he worked last night and works again [employer] and was sleeping/kept call short. Son reports SW, Patrick [name] called and felt it was a good call this morning. Assessments were completed by SW and son said no changes since that call this morning/Assessments not completed related to son needing to rest to working nights  Initial Transition Care Management Follow-up Telephone Call    Past Medical History:  Diagnosis Date   Atrial fibrillation (HCC)    CAD (coronary artery disease)    Chronic anticoagulation, on coumadin  07/17/2013   CKD (chronic kidney disease), stage II    CVA (cerebral infarction) 11/22/2010   Thalamic with residual memory loss and slow speech   Depression    Diabetes mellitus type II    Insulin  dependent   GERD (gastroesophageal reflux disease)    Gout    Hyperlipidemia    Hypertension    Myocardial infarction (HCC) 03/31/1999   "near heart attack" (07/14/2013)   Paroxysmal atrial fibrillation (HCC)    on coumadin    Permanent atrial fibrillation (HCC) 07/17/2013   Shortness of breath    "can happen at any time" (07/14/2013)   Sleep apnea 07/30/2010   "has mask at home; seldom uses it" (07/14/2013)   Stomach ulcer 03/30/1949   "as a teenager"   Stroke (HCC) ~ 2007; ~ 2009   "memory not as good since" (07/14/2013)   Stroke (HCC)    Tachy-brady  syndrome (HCC) 07/17/2013   Upper GI bleeding 07/01/2013    Assessment:see above - not completed  Patient Reported Symptoms: There were no vitals filed for this visit.  Medications Reviewed Today     Reviewed by Patrick Dearth, RN (Registered Nurse) on 12/11/23 at 1501  Med List Status: <None>   Medication Order Taking? Sig Documenting Provider Last Dose Status Informant  allopurinol  (ZYLOPRIM ) 100 MG tablet [id number] Yes Take 0.5 tablets (50 mg total) by mouth every other day. [name], Patrick C, NP Taking Active   amLODipine  (NORVASC ) 10 MG tablet [id number] Yes TAKE ONE TABLET BY MOUTH ONE TIME DAILY [name], Patrick C, NP Taking Active   apixaban  (ELIQUIS ) 2.5 MG TABS tablet [id number] Yes Take 1 tablet (2.5 mg total) by mouth 2 (two) times daily. Patrick Casey, MD Taking Active   atorvastatin  (LIPITOR) 20 MG tablet [id number] Yes Take 1 tablet (20 mg total) by mouth daily. Patrick Casey, MD Taking Active   calcitRIOL (ROCALTROL) 0.25 MCG capsule [id number] Yes Take 0.25 mcg by mouth daily. [provider] Taking Active   carvedilol  (COREG ) 12.5 MG tablet [id number] Yes Take 1 tablet (12.5 mg total) by mouth 2 (two) times daily with a meal. [name], Patrick Heman, MD Taking Active   hydrALAZINE  (APRESOLINE ) 100 MG tablet [id number] Yes Take 1 tablet (100 mg total) by mouth 3 (three) times daily. Patrick Casey, MD Taking Active   isosorbide  mononitrate (IMDUR ) 30 MG 24 hr tablet [id number] Yes  Take 1 tablet (30 mg total) by mouth daily. Patrick Casey, MD Taking Active             Recommendation:   PCP Follow-up 12/12/23  Follow Up Plan:   Telephone follow up appointment date/time:  12/19/23 3pm  Patrick Frankel RN, CCM [facility]  VBCI-Population Health RN Care Manager [phone]

## 2023-12-11 NOTE — Patient Outreach (Signed)
 Complex Care Management   Visit Note  12/11/2023  Name:  Patrick [name] MRN: [id number] DOB: [birth year]  Situation: Referral received for Complex Care Management related to Diabetes with Complications, Chronic Kidney Disease, and Atrial Fibrillation I obtained verbal consent from Caregiver.  Visit completed with Caregivers, Son Carolyn Cisco and Daughter in [place], [place]  on the phone  Background:   Past Medical History:  Diagnosis Date   Atrial fibrillation [facility])    CAD (coronary artery disease)    Chronic anticoagulation, on coumadin  07/17/2013   CKD (chronic kidney disease), stage II    CVA (cerebral infarction) 11/22/2010   Thalamic with residual memory loss and slow speech   Depression    Diabetes mellitus type II    Insulin  dependent   GERD (gastroesophageal reflux disease)    Gout    Hyperlipidemia    Hypertension    Myocardial infarction (HCC) 03/31/1999   "near heart attack" (07/14/2013)   Paroxysmal atrial fibrillation (HCC)    on coumadin    Permanent atrial fibrillation (HCC) 07/17/2013   Shortness of breath    "can happen at any time" (07/14/2013)   Sleep apnea 07/30/2010   "has mask at home; seldom uses it" (07/14/2013)   Stomach ulcer 03/30/1949   "as a teenager"   Stroke (HCC) ~ 2007; ~ 2009   "memory not as good since" (07/14/2013)   Stroke (HCC)    Tachy-brady syndrome (HCC) 07/17/2013   Upper GI bleeding 07/01/2013    Assessment: Patient Reported Symptoms:  Cognitive Cognitive Status: Alert and oriented to person, place, and time, Normal speech and language skills (per family)      Neurological Neurological Review of Symptoms: No symptoms reported Neurological Conditions: Dementia, Stroke, ischemic Neurological Management Strategies: Medication therapy  HEENT HEENT Symptoms Reported: No symptoms reported      Cardiovascular Cardiovascular Symptoms Reported: No symptoms reported Cardiovascular Conditions: Hypertension, Coronary artery  disease Cardiovascular Management Strategies: Medication therapy  Respiratory Respiratory Symptoms Reported: No symptoms reported    Endocrine Patient reports the following symptoms related to hypoglycemia or hyperglycemia : No symptoms reported Is patient diabetic?: Yes Endocrine Conditions: Diabetes Endocrine Management Strategies: Medication therapy  Gastrointestinal Gastrointestinal Symptoms Reported: No symptoms reported      Genitourinary Genitourinary Symptoms Reported: No symptoms reported Genitourinary Conditions: Chronic kidney disease  Integumentary Integumentary Symptoms Reported: No symptoms reported    Musculoskeletal Musculoskelatal Symptoms Reviewed: No symptoms reported Additional Musculoskeletal Details: Family reports pt walks slowly   Falls in the past year?: Yes Number of falls in past year: 2 or more Was there an injury with Fall?: Yes Fall Risk Category Calculator: 3 Patient Fall Risk Level: High Fall Risk Patient at Risk for Falls Due to: History of fall(s), Impaired mobility  Psychosocial Psychosocial Symptoms Reported: No symptoms reported   Major Change/Loss/Stressor/Fears (CP): Medical condition, family, Medical condition, self Quality of Family Relationships: helpful, involved, supportive Do you feel physically threatened by others?: No      05/24/2023    1:34 PM  Depression screen PHQ 2/9  Decreased Interest 0  Down, Depressed, Hopeless 0  PHQ - 2 Score 0    There were no vitals filed for this visit.  Medications Reviewed Today     Reviewed by Adriana Albany, LCSW (Social Worker) on 12/11/23 at 1023  Med List Status: <None>   Medication Order Taking? Sig Documenting Provider Last Dose Status Informant  allopurinol  (ZYLOPRIM ) 100 MG tablet [id number] Yes Take 0.5 tablets (50 mg total) by mouth every other  day. [name], [name], NP Taking Active   amLODipine  (NORVASC ) 10 MG tablet [id number] Yes TAKE ONE TABLET BY MOUTH ONE TIME DAILY  [name], [name], NP Taking Active   apixaban  (ELIQUIS ) 2.5 MG TABS tablet [id number] Yes Take 1 tablet (2.5 mg total) by mouth 2 (two) times daily. Burton Casey, MD Taking Active   atorvastatin  (LIPITOR) 20 MG tablet [id number] Yes Take 1 tablet (20 mg total) by mouth daily. Burton Casey, MD Taking Active   calcitRIOL (ROCALTROL) 0.25 MCG capsule [id number] Yes Take 0.25 mcg by mouth daily. [provider] Taking Active   carvedilol  (COREG ) 12.5 MG tablet [id number] Yes Take 1 tablet (12.5 mg total) by mouth 2 (two) times daily with a meal. Ghimire, Estil Heman, MD Taking Active   hydrALAZINE  (APRESOLINE ) 100 MG tablet [id number] Yes Take 1 tablet (100 mg total) by mouth 3 (three) times daily. Burton Casey, MD Taking Active   isosorbide  mononitrate (IMDUR ) 30 MG 24 hr tablet [id number]  Take 1 tablet (30 mg total) by mouth daily. Ghimire, Estil Heman, MD  Active             Recommendation:   Continue utilizing strategies discussed to assist with symptom management  Follow Up Plan:   Telephone follow-up in 1 week  Alease Hunter, LCSW [place]  [facility], [facility] Clinical Social Worker Direct Dial: [phone]  Fax: [phone] Website: [email or web address] 10:37 AM

## 2023-12-12 ENCOUNTER — Other Ambulatory Visit (HOSPITAL_BASED_OUTPATIENT_CLINIC_OR_DEPARTMENT_OTHER)

## 2023-12-12 ENCOUNTER — Other Ambulatory Visit (HOSPITAL_COMMUNITY): Payer: Self-pay | Admitting: Adult Health

## 2023-12-12 ENCOUNTER — Ambulatory Visit: Admitting: Adult Health

## 2023-12-12 ENCOUNTER — Encounter: Payer: Self-pay | Admitting: Adult Health

## 2023-12-12 ENCOUNTER — Ambulatory Visit (HOSPITAL_BASED_OUTPATIENT_CLINIC_OR_DEPARTMENT_OTHER)
Admission: RE | Admit: 2023-12-12 | Discharge: 2023-12-12 | Disposition: A | Discharge status: Home / Self Care | Source: Ambulatory Visit | Attending: Adult Health | Admitting: Adult Health

## 2023-12-12 ENCOUNTER — Telehealth: Payer: Self-pay

## 2023-12-12 VITALS — BP 140/64 | HR 65 | Temp 98.7°F | Resp 21 | Ht 67.0 in | Wt 179.6 lb

## 2023-12-12 DIAGNOSIS — R600 Localized edema: Secondary | ICD-10-CM

## 2023-12-12 DIAGNOSIS — I4821 Permanent atrial fibrillation: Secondary | ICD-10-CM | POA: Diagnosis not present

## 2023-12-12 DIAGNOSIS — N185 Chronic kidney disease, stage 5: Secondary | ICD-10-CM

## 2023-12-12 DIAGNOSIS — I129 Hypertensive chronic kidney disease with stage 1 through stage 4 chronic kidney disease, or unspecified chronic kidney disease: Secondary | ICD-10-CM

## 2023-12-12 DIAGNOSIS — D638 Anemia in other chronic diseases classified elsewhere: Secondary | ICD-10-CM

## 2023-12-12 DIAGNOSIS — N184 Chronic kidney disease, stage 4 (severe): Secondary | ICD-10-CM

## 2023-12-12 NOTE — Progress Notes (Signed)
 [facility] clinic  Provider:  Inge Mangle DNP  Code Status: Full Code  Goals of Care:     11/21/2023    8:45 PM  Advanced Directives  Does Patient Have a Medical Advance Directive? No  Would patient like information on creating a medical advance directive? No - Patient declined     Chief Complaint  Patient presents with   Foot Swelling    Discussed the use of AI scribe software for clinical note transcription with the patient, who gave verbal consent to proceed.   HPI: Patient is a 78 y.o. male seen today for an acute visit for foot swelling. He was accompanied by his son and wife.  He has bilateral lower extremity edema, described as 'too big' and 'too puffy', present for an unspecified duration. The swelling is not painful but causes discomfort when touched. No ultrasound has been performed yet to evaluate the swelling.  He has chronic kidney disease with a creatinine level of 5.61 and a GFR of 10. He has seen a nephrologist in the past, but it has been a while since his last visit. He urinates frequently and does not feel like he is retaining urine.  He is on multiple medications for hypertension, including amlodipine  10 mg daily, hydralazine  100 mg three times a day, Imdur  30 mg daily, and Coreg  12.5 mg twice a day. He also takes Eliquis  2.5 mg twice a day for atrial fibrillation and Lipitor 20 mg daily for hyperlipidemia.  No chest pain or shortness of breath, but he experiences fatigue and needs to take breaks when walking. His family history includes his oldest son who is on dialysis.    Past Medical History:  Diagnosis Date   Atrial fibrillation (HCC)    CAD (coronary artery disease)    Chronic anticoagulation, on coumadin  07/17/2013   CKD (chronic kidney disease), stage II    CVA (cerebral infarction) 11/22/2010   Thalamic with residual memory loss and slow speech   Depression    Diabetes mellitus type II    Insulin  dependent   GERD (gastroesophageal reflux  disease)    Gout    Hyperlipidemia    Hypertension    Myocardial infarction (HCC) 03/31/1999   "near heart attack" (07/14/2013)   Paroxysmal atrial fibrillation (HCC)    on coumadin    Permanent atrial fibrillation (HCC) 07/17/2013   Shortness of breath    "can happen at any time" (07/14/2013)   Sleep apnea 07/30/2010   "has mask at home; seldom uses it" (07/14/2013)   Stomach ulcer 03/30/1949   "as a teenager"   Stroke (HCC) ~ 2007; ~ 2009   "memory not as good since" (07/14/2013)   Stroke (HCC)    Tachy-brady syndrome (HCC) 07/17/2013   Upper GI bleeding 07/01/2013    Past Surgical History:  Procedure Laterality Date   CARDIAC CATHETERIZATION  07/2003   [name] notes 08/27/2005 (07/01/2013)    No Known Allergies  Outpatient Encounter Medications as of 12/12/2023  Medication Sig   allopurinol  (ZYLOPRIM ) 100 MG tablet Take 0.5 tablets (50 mg total) by mouth every other day.   amLODipine  (NORVASC ) 10 MG tablet TAKE ONE TABLET BY MOUTH ONE TIME DAILY   apixaban  (ELIQUIS ) 2.5 MG TABS tablet Take 1 tablet (2.5 mg total) by mouth 2 (two) times daily.   atorvastatin  (LIPITOR) 20 MG tablet Take 1 tablet (20 mg total) by mouth daily.   calcitRIOL (ROCALTROL) 0.25 MCG capsule Take 0.25 mcg by mouth daily.   carvedilol  (COREG ) 12.5  MG tablet Take 1 tablet (12.5 mg total) by mouth 2 (two) times daily with a meal.   hydrALAZINE  (APRESOLINE ) 100 MG tablet Take 1 tablet (100 mg total) by mouth 3 (three) times daily.   isosorbide  mononitrate (IMDUR ) 30 MG 24 hr tablet Take 1 tablet (30 mg total) by mouth daily.   No facility-administered encounter medications on file as of 12/12/2023.    Review of Systems:  Review of Systems  Constitutional:  Negative for activity change, appetite change and fever.  HENT:  Negative for sore throat.   Eyes: Negative.   Cardiovascular:  Negative for chest pain and leg swelling.  Gastrointestinal:  Negative for abdominal distention, diarrhea and  vomiting.  Genitourinary:  Negative for dysuria, frequency and urgency.  Skin:  Negative for color change.  Neurological:  Negative for dizziness and headaches.  Psychiatric/Behavioral:  Negative for behavioral problems and sleep disturbance. The patient is not nervous/anxious.     Health Maintenance  Topic Date Due   COVID-19 Vaccine (1) Never done   Zoster Vaccines- Shingrix (1 of 2) Never done   OPHTHALMOLOGY EXAM  07/01/2015   Diabetic kidney evaluation - Urine ACR  10/18/2015   Pneumonia Vaccine 43+ Years old (2 of 2 - PCV) 12/26/2023 (Originally 05/26/2010)   FOOT EXAM  12/21/2023   HEMOGLOBIN A1C  02/21/2024   INFLUENZA VACCINE  02/28/2024   Medicare Annual Wellness (AWV)  05/23/2024   LIPID PANEL  11/21/2024   Diabetic kidney evaluation - eGFR measurement  11/25/2024   Hepatitis C Screening  Completed   HPV VACCINES  Aged Out   Meningococcal B Vaccine  Aged Out   DTaP/Tdap/Td  Discontinued   Colonoscopy  Discontinued    Physical Exam: Vitals:   12/12/23 1257  BP: (!) 140/64  Pulse: 65  Resp: (!) 21  Temp: 98.7 F (37.1 C)  SpO2: 98%  Weight: 179 lb 9.6 oz (81.5 kg)  Height: 5\' 7"  (1.702 m)   Body mass index is 28.13 kg/m. Physical Exam Constitutional:      Appearance: Normal appearance.  HENT:     Head: Normocephalic and atraumatic.     Mouth/Throat:     Mouth: Mucous membranes are moist.  Eyes:     Conjunctiva/sclera: Conjunctivae normal.  Cardiovascular:     Rate and Rhythm: Normal rate and regular rhythm.     Pulses: Normal pulses.     Heart sounds: Normal heart sounds.  Pulmonary:     Effort: Pulmonary effort is normal.     Breath sounds: Normal breath sounds.  Abdominal:     General: Bowel sounds are normal.     Palpations: Abdomen is soft.  Musculoskeletal:        General: Swelling present.     Cervical back: Normal range of motion.     Right lower leg: Edema present.     Left lower leg: Edema present.     Comments: BLE 2+edema  Skin:     General: Skin is warm and dry.  Neurological:     Mental Status: He is alert.  Psychiatric:        Mood and Affect: Mood normal.        Behavior: Behavior normal.     Labs reviewed: Basic Metabolic Panel: Recent Labs    08/27/23 1313 10/15/23 1232 11/21/23 1820 11/22/23 0128 11/23/23 0936 11/24/23 0540 11/25/23 0423 11/26/23 1221  NA 136 137   < > 144 142 139 136 138  K 3.8 3.5   < >  3.4* 3.4* 3.4* 4.4 4.7  CL 105 108   < > 111 110 109 107 107  CO2 22 22   < > 16* 20* 19* 18* 20  GLUCOSE 98 104*   < > 122* 104* 87 96 145*  BUN 66* 66*   < > 94* 93* 89* 84* 86*  CREATININE 4.44* 4.33*   < > 5.10* 5.38* 5.19* 5.38* 5.61*  CALCIUM  8.4*  8.4* 8.0*  8.0*   < > 8.0* 7.8* 7.5* 7.6* 7.9*  MG  --   --   --  1.7 1.9  --   --   --   PHOS 3.1 3.8  --  4.6  --   --   --   --   TSH  --   --   --  4.134  --   --   --   --    < > = values in this interval not displayed.   Liver Function Tests: Recent Labs    12/21/22 1418 02/18/23 0000 08/27/23 1313 10/15/23 1232 11/22/23 0128 11/26/23 1221  AST 17  --   --   --  18 16  ALT 15  --   --   --  11 9  ALKPHOS  --   --   --   --  79  --   BILITOT 0.5  --   --   --  1.0 0.4  PROT 6.5  --   --   --  6.8 6.2  ALBUMIN  --    < > 3.2* 3.2* 3.2*  --    < > = values in this interval not displayed.   No results for input(s): "LIPASE", "AMYLASE" in the last 8760 hours. No results for input(s): "AMMONIA" in the last 8760 hours. CBC: Recent Labs    02/18/23 0000 05/01/23 0000 11/21/23 1820 11/22/23 0128 11/25/23 0423 11/26/23 1221  WBC 4.2  --  4.6 4.3 4.5 4.1  NEUTROABS 1.70  --  3.3  --   --  2,144  HGB 8.4*   < > 10.7* 10.6* 9.1* 9.1*  HCT 26*  --  34.7* 34.9* 29.4* 29.4*  MCV  --   --  85.0 85.7 84.0 84.2  PLT 155  --  153 167 158 168   < > = values in this interval not displayed.   Lipid Panel: Recent Labs    12/21/22 1418 11/22/23 0128  CHOL 169 156  HDL 67 58  LDLCALC 86 84  TRIG 74 68  CHOLHDL 2.5 2.7   Lab  Results  Component Value Date   HGBA1C 5.4 11/22/2023    Procedures since last visit: MR ANGIO HEAD WO CONTRAST Result Date: 11/24/2023 CLINICAL DATA:  Stroke, follow-up. Acute lacunar infarct of the right cerebellum. EXAM: MRA HEAD WITHOUT CONTRAST TECHNIQUE: Angiographic images of the Circle of Willis were acquired using MRA technique without intravenous contrast. COMPARISON:  MR head without contrast 11/22/2023 FINDINGS: Anterior circulation: High cervical segments are within normal limits bilaterally. 50% stenosis is present within the left ophthalmic segment. Moderate high-grade focal stenosis is present in the supraclinoid right ICA. The ICA termini are within limits bilaterally. The right A1 segment is dominant. Mild narrowing is present the distal left M1 segment. Moderate to high-grade stenoses are present in the proximal posterior left M2 segments. More distal segmental narrowing is present in the right MCA branch vessels. Moderate segmental stenoses are present in the A2 segments bilaterally. Moderate stenosis is present in  the proximal right pericallosal artery. Posterior circulation: The to left vertebral artery dominant. High-grade stenoses or occlusion are present in the distal right V4 segment. Proximal basilar artery is somewhat irregular. Moderate to high-grade stenosis is present in the more distal basilar artery just below the superior cerebellar arteries. The superior cerebellar arteries are patent bilaterally. Moderate stenosis is present in the distal left P2 segment. Right posterior cerebral artery is of fetal type. Moderate stenosis is present in the proximal right P2 segment. Mild attenuation of distal PCA branch vessels is present bilaterally. Anatomic variants: Fetal type right posterior cerebral artery. Other: None. IMPRESSION: 1. High-grade stenoses or occlusion in the distal right V4 segment. 2. Moderate to high-grade stenoses in the proximal posterior left M2 segments. 3.  Moderate to high-grade stenosis in the more distal basilar artery just below the superior cerebellar arteries. 4. Moderate stenosis in the distal left P2 segment. 5. Moderate stenosis in the proximal right P2 segment. 6. Moderate segmental stenoses in the A2 segments bilaterally. 7. Moderate stenosis in the proximal right pericallosal artery. 8. 50% stenosis in the left ophthalmic segment. 9. Moderate high-grade focal stenosis in the supraclinoid right ICA. Electronically Signed   By: Audree Leas M.D.   On: 11/24/2023 18:41   VAS US  CAROTID (at [facility] and [facility] only) Result Date: 11/22/2023 Carotid Arterial Duplex Study Patient Name:  ARLANDO LEISINGER  Date of Exam:   11/22/2023 Medical Rec #: [id number]     Accession #:    [id number] Date of Birth: Mar 15, 1946      Patient Gender: M Patient Age:   54 years Exam Location:  [facility] Procedure:      VAS US  CAROTID Referring Phys: Vada Garibaldi --------------------------------------------------------------------------------  Indications:       TIA and Onset headache. Risk Factors:      Hypertension, Diabetes. Other Factors:     H/O stroke, A-fid on Eliquis . Limitations        Today's exam was limited due to Extremely tortuous vessels. Comparison Study:  Previous study on 4.23.2017. Performing Technologist: Ria Chad  Examination Guidelines: A complete evaluation includes B-mode imaging, spectral Doppler, color Doppler, and power Doppler as needed of all accessible portions of each vessel. Bilateral testing is considered an integral part of a complete examination. Limited examinations for reoccurring indications may be performed as noted.  Right Carotid Findings: +----------+--------+--------+--------+------------------+--------+           PSV cm/sEDV cm/sStenosisPlaque DescriptionComments +----------+--------+--------+--------+------------------+--------+ CCA Prox  47      6                                           +----------+--------+--------+--------+------------------+--------+ CCA Distal49      15                                         +----------+--------+--------+--------+------------------+--------+ ICA Prox  42      18              calcific                   +----------+--------+--------+--------+------------------+--------+ ICA Mid   86      31                                         +----------+--------+--------+--------+------------------+--------+  ICA Distal52      23                                         +----------+--------+--------+--------+------------------+--------+ ECA       81                                                 +----------+--------+--------+--------+------------------+--------+ +----------+--------+-------+--------+-------------------+           PSV cm/sEDV cmsDescribeArm Pressure (mmHG) +----------+--------+-------+--------+-------------------+ Subclavian170                                        +----------+--------+-------+--------+-------------------+ +---------+--------+--+--------+--+ VertebralPSV cm/s85EDV cm/s32 +---------+--------+--+--------+--+  Left Carotid Findings: +----------+--------+--------+--------+------------------+--------+           PSV cm/sEDV cm/sStenosisPlaque DescriptionComments +----------+--------+--------+--------+------------------+--------+ CCA Prox  46      6                                          +----------+--------+--------+--------+------------------+--------+ CCA Distal39      7                                          +----------+--------+--------+--------+------------------+--------+ ICA Prox  49      17              calcific                   +----------+--------+--------+--------+------------------+--------+ ICA Mid   50      16                                         +----------+--------+--------+--------+------------------+--------+ ICA Distal47      18                                          +----------+--------+--------+--------+------------------+--------+ ECA       65      5                                          +----------+--------+--------+--------+------------------+--------+ +----------+--------+--------+--------+-------------------+           PSV cm/sEDV cm/sDescribeArm Pressure (mmHG) +----------+--------+--------+--------+-------------------+ Subclavian132                                         +----------+--------+--------+--------+-------------------+ +---------+--------+--+--------+--+ VertebralPSV cm/s45EDV cm/s13 +---------+--------+--+--------+--+   Summary: Right Carotid: Velocities in the right ICA are consistent with a 1-39% stenosis. Left Carotid: Velocities in the left ICA are consistent with a 1-39% stenosis. Vertebrals:  Bilateral vertebral arteries demonstrate antegrade flow. Subclavians: Normal flow hemodynamics were seen in  bilateral subclavian              arteries. *See table(s) above for measurements and observations.  Electronically signed by Jimmye Moulds MD on 11/22/2023 at 12:08:48 PM.    Final    ECHOCARDIOGRAM COMPLETE Result Date: 11/22/2023    ECHOCARDIOGRAM REPORT   Patient Name:   MAXIMILIANO CROMARTIE Date of Exam: 11/22/2023 Medical Rec #:  [id number]    Height:       67.0 in Accession #:    [id number]   Weight:       175.5 lb Date of Birth:  06-22-1946     BSA:          1.913 m Patient Age:    77 years     BP:           187/92 mmHg Patient Gender: M            HR:           69 bpm. Exam Location:  Inpatient Procedure: 2D Echo (Both Spectral and Color Flow Doppler were utilized during            procedure). Indications:    elevated troponin  History:        Patient has prior history of Echocardiogram examinations, most                 recent 08/03/2022. Chronic kidney disease, Arrythmias:Atrial                 Fibrillation and PVC; Risk Factors:Hypertension, Dyslipidemia,                 Diabetes and  Sleep Apnea.  Sonographer:    Dione Franks RDCS Referring Phys: [id number] SARA-MAIZ A THOMAS IMPRESSIONS  1. Left ventricular ejection fraction, by estimation, is 55 to 60%. The left ventricle has normal function. The left ventricle has no regional wall motion abnormalities. There is severe concentric left ventricular hypertrophy. Left ventricular diastolic  parameters are indeterminate.  2. Right ventricular systolic function is normal. The right ventricular size is normal. There is moderately elevated pulmonary artery systolic pressure. The estimated right ventricular systolic pressure is 59.8 mmHg.  3. Left atrial size was severely dilated.  4. Right atrial size was moderately dilated.  5. A small pericardial effusion is present. The pericardial effusion is posterior and lateral to the left ventricle. There is no evidence of cardiac tamponade.  6. The mitral valve is degenerative. Mild mitral valve regurgitation. No evidence of mitral stenosis. Moderate mitral annular calcification.  7. The aortic valve is tricuspid. There is mild calcification of the aortic valve. Aortic valve regurgitation is mild. No aortic stenosis is present.  8. Aortic dilatation noted. There is borderline dilatation of the aortic root, measuring 38 mm.  9. The inferior vena cava is normal in size with <50% respiratory variability, suggesting right atrial pressure of 8 mmHg. FINDINGS  Left Ventricle: Left ventricular ejection fraction, by estimation, is 55 to 60%. The left ventricle has normal function. The left ventricle has no regional wall motion abnormalities. The left ventricular internal cavity size was normal in size. There is  severe concentric left ventricular hypertrophy. Left ventricular diastolic parameters are indeterminate. Right Ventricle: The right ventricular size is normal. No increase in right ventricular wall thickness. Right ventricular systolic function is normal. There is moderately elevated pulmonary artery  systolic pressure. The tricuspid regurgitant velocity is 3.70 m/s, and with an assumed right atrial pressure of 5 mmHg, the estimated right  ventricular systolic pressure is 59.8 mmHg. Left Atrium: Left atrial size was severely dilated. Right Atrium: Right atrial size was moderately dilated. Pericardium: A small pericardial effusion is present. The pericardial effusion is posterior and lateral to the left ventricle. There is no evidence of cardiac tamponade. Mitral Valve: The mitral valve is degenerative in appearance. There is mild calcification of the mitral valve leaflet(s). Moderate mitral annular calcification. Mild mitral valve regurgitation. No evidence of mitral valve stenosis. Tricuspid Valve: The tricuspid valve is normal in structure. Tricuspid valve regurgitation is trivial. No evidence of tricuspid stenosis. Aortic Valve: The aortic valve is tricuspid. There is mild calcification of the aortic valve. Aortic valve regurgitation is mild. Aortic regurgitation PHT measures 449 msec. No aortic stenosis is present. Pulmonic Valve: The pulmonic valve was normal in structure. Pulmonic valve regurgitation is trivial. No evidence of pulmonic stenosis. Aorta: Aortic dilatation noted. There is borderline dilatation of the aortic root, measuring 38 mm. Venous: The inferior vena cava is normal in size with less than 50% respiratory variability, suggesting right atrial pressure of 8 mmHg. IAS/Shunts: No atrial level shunt detected by color flow Doppler.  LEFT VENTRICLE PLAX 2D LVIDd:         4.60 cm   Diastology LVIDs:         3.10 cm   LV e' lateral: 6.42 cm/s LV PW:         1.60 cm LV IVS:        1.50 cm LVOT diam:     1.90 cm LV SV:         43 LV SV Index:   22 LVOT Area:     2.84 cm  RIGHT VENTRICLE            IVC RV S prime:     7.94 cm/s  IVC diam: 1.50 cm TAPSE (M-mode): 0.8 cm LEFT ATRIUM             Index        RIGHT ATRIUM           Index LA diam:        4.10 cm 2.14 cm/m   RA Area:     21.00 cm LA Vol  (A2C):   80.0 ml 41.82 ml/m  RA Volume:   63.20 ml  33.04 ml/m LA Vol (A4C):   95.1 ml 49.71 ml/m LA Biplane Vol: 92.1 ml 48.15 ml/m  AORTIC VALVE LVOT Vmax:   77.90 cm/s LVOT Vmean:  49.900 cm/s LVOT VTI:    0.150 m AI PHT:      449 msec  AORTA Ao Root diam: 3.80 cm Ao Asc diam:  3.30 cm TRICUSPID VALVE TR Peak grad:   54.8 mmHg TR Vmax:        370.00 cm/s  SHUNTS Systemic VTI:  0.15 m Systemic Diam: 1.90 cm Jules Oar MD Electronically signed by Jules Oar MD Signature Date/Time: 11/22/2023/11:16:48 AM    Final    MR BRAIN WO CONTRAST Result Date: 11/22/2023 CLINICAL DATA:  Headache EXAM: MRI HEAD WITHOUT CONTRAST TECHNIQUE: Multiplanar, multiecho pulse sequences of the brain and surrounding structures were obtained without intravenous contrast. COMPARISON:  11/21/2015 FINDINGS: Brain: Small focus of acute ischemia in the right cerebellum. Hemosiderin deposition in the right cerebellum at the site of old infarct. Multiple chronic microhemorrhages in a predominantly peripheral distribution. Bihemispheric subarachnoid siderosis. There is multifocal hyperintense T2-weighted signal within the white matter. Generalized volume loss. Old right frontal and occipital infarcts. The midline structures  are normal. Vascular: Normal flow voids. Skull and upper cervical spine: Normal calvarium and skull base. Visualized upper cervical spine and soft tissues are normal. Sinuses/Orbits:No paranasal sinus fluid levels or advanced mucosal thickening. No mastoid or middle ear effusion. Normal orbits. IMPRESSION: 1. Small focus of acute ischemia in the right cerebellum. No hemorrhage or mass effect. 2. Multiple chronic microhemorrhages in a predominantly peripheral distribution, which may indicate cerebral amyloid angiopathy. 3. Old right frontal and occipital infarcts. Electronically Signed   By: Juanetta Nordmann M.D.   On: 11/22/2023 00:34   DG Chest Port 1 View Result Date: 11/21/2023 CLINICAL DATA:  Weakness.  EXAM: PORTABLE CHEST 1 VIEW COMPARISON:  Chest radiograph dated Dec 08, 2015. FINDINGS: Stable cardiomegaly. Aortic atherosclerosis. No focal consolidation, pleural effusion, or pneumothorax. No acute osseous abnormality. IMPRESSION: 1. No acute cardiopulmonary findings. 2. Stable cardiomegaly. Electronically Signed   By: Mannie Seek M.D.   On: 11/21/2023 19:52   CT Head Wo Contrast Result Date: 11/21/2023 CLINICAL DATA:  Headache EXAM: CT HEAD WITHOUT CONTRAST TECHNIQUE: Contiguous axial images were obtained from the base of the skull through the vertex without intravenous contrast. RADIATION DOSE REDUCTION: This exam was performed according to the departmental dose-optimization program which includes automated exposure control, adjustment of the mA and/or kV according to patient size and/or use of iterative reconstruction technique. COMPARISON:  Head CT 01/30/2016. FINDINGS: Brain: No evidence of acute infarction, hemorrhage, hydrocephalus, or acute extra-axial collection or mass lesion/mass effect. Bifrontal low-density subdural fluid collections are unchanged from the prior study. Left Vascular: Atherosclerotic calcifications are present within the cavernous internal carotid arteries. Skull: Normal. Negative for fracture or focal lesion. Sinuses/Orbits: No acute finding. Other: None. IMPRESSION: 1. No acute intracranial process. 2. 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 Electronically Signed   By: Tyron Gallon M.D.   On: 11/21/2023 19:47    Assessment/Plan  1. Chronic kidney disease, stage V (HCC) -  Stage 5 CKD with GFR 10, indicating ESRD. Fluid overload necessitates dialysis due to potential cardiac complications. Fatigue and dyspnea likely from fluid retention and anemia. - Order venous Doppler ultrasound of lower extremities to rule out DVT. - Instruct to elevate feet to manage edema. - Advise follow-up with  nephrologist before May 29. - Basic Metabolic Panel with eGFR - Basic Metabolic Panel with eGFR; Future  2. Anemia of chronic disease -  Anemia secondary to CKD with hemoglobin 9.1, contributing to fatigue and cold intolerance. - Order CBC to monitor hemoglobin levels. - CBC with Differential/Platelets  3. Lower extremity edema (Primary) - VAS US  LOWER EXTREMITY VENOUS (DVT) was negative - US  Venous Img Lower Bilateral - furosemide  (LASIX ) 20 MG tablet; Take 1 tablet (20 mg total) by mouth daily for 5 days.  Dispense: 5 tablet; Refill: 0  4. Permanent atrial fibrillation (HCC) -  rate-controlled -  continue Eliquis  2.5 mg BID for anticoagulation and Carvedilol  12.5 mg BID for rate-control  5. Benign hypertension with chronic kidney disease -  Hypertension managed with amlodipine , hydralazine , Imdur , and Coreg . Blood pressure 140/64 mmHg, indicating control.       Labs/tests ordered:  BMP, CBC, venous doppler ultrasound of BLE   No follow-ups on file.  Camara Rosander [name], NP

## 2023-12-12 NOTE — Telephone Encounter (Signed)
 Spoke E2C2 stating the order that was placed today and needs to be change US  Lower EXTREMITY and Imaging code 2126. Patient is there now and wanting know what is the step depending on results.  Please Advise Please the office Norrine Bedford [phone] if you have any questions.   Message sent to [name], [name], NP

## 2023-12-13 LAB — CBC WITH DIFFERENTIAL/PLATELET
Absolute Lymphocytes: 494 {cells}/uL — ABNORMAL LOW (ref 850–3900)
Absolute Monocytes: 456 {cells}/uL (ref 200–950)
Basophils Absolute: 28 {cells}/uL (ref 0–200)
Basophils Relative: 0.6 %
Eosinophils Absolute: 1387 {cells}/uL — ABNORMAL HIGH (ref 15–500)
Eosinophils Relative: 29.5 %
HCT: 27.6 % — ABNORMAL LOW (ref 38.5–50.0)
Hemoglobin: 8.4 g/dL — ABNORMAL LOW (ref 13.2–17.1)
MCH: 26 pg — ABNORMAL LOW (ref 27.0–33.0)
MCHC: 30.4 g/dL — ABNORMAL LOW (ref 32.0–36.0)
MCV: 85.4 fL (ref 80.0–100.0)
MPV: 11.6 fL (ref 7.5–12.5)
Monocytes Relative: 9.7 %
Neutro Abs: 2336 {cells}/uL (ref 1500–7800)
Neutrophils Relative %: 49.7 %
Platelets: 221 10*3/uL (ref 140–400)
RBC: 3.23 10*6/uL — ABNORMAL LOW (ref 4.20–5.80)
RDW: 14.5 % (ref 11.0–15.0)
Total Lymphocyte: 10.5 %
WBC: 4.7 10*3/uL (ref 3.8–10.8)

## 2023-12-13 LAB — BASIC METABOLIC PANEL WITHOUT GFR
BUN/Creatinine Ratio: 16 (calc) (ref 6–22)
BUN: 95 mg/dL — ABNORMAL HIGH (ref 7–25)
CO2: 19 mmol/L — ABNORMAL LOW (ref 20–32)
Calcium: 8.1 mg/dL — ABNORMAL LOW (ref 8.6–10.3)
Chloride: 108 mmol/L (ref 98–110)
Creat: 6.02 mg/dL — ABNORMAL HIGH (ref 0.70–1.28)
Glucose, Bld: 127 mg/dL (ref 65–139)
Potassium: 4.2 mmol/L (ref 3.5–5.3)
Sodium: 138 mmol/L (ref 135–146)

## 2023-12-13 MED ORDER — FUROSEMIDE 20 MG PO TABS: 20.0000 mg | ORAL_TABLET | Freq: Every day | ORAL | 0 refills | Status: AC

## 2023-12-13 NOTE — Telephone Encounter (Signed)
 MyChart message also sent to patient as another form of communication.

## 2023-12-13 NOTE — Telephone Encounter (Signed)
Called patient and no answer. Voicemail was left with office call back number.   

## 2023-12-13 NOTE — Telephone Encounter (Signed)
 Ultrasound showed negative DVT. I have prescribed Lasix  20 mg daily X 5 days, pls follow up in the office for lab draw next week. Eat banana for potassium supplement.

## 2023-12-14 ENCOUNTER — Ambulatory Visit: Payer: Self-pay | Admitting: Adult Health

## 2023-12-14 LAB — LAB REPORT - SCANNED: Creatinine, POC: 55.4 mg/dL

## 2023-12-14 NOTE — Progress Notes (Signed)
-    hgb 8.4, down from 9.1 -  creatinine 6.02, now on chronic kidney disease stage 5, will need to follow up with Dr. Higinio Love, nephrology, as soon as possible might need to start hemodialysis -  negative for DVT lower extremities -  calcium  improved from 7.9 to 8.1

## 2023-12-17 ENCOUNTER — Ambulatory Visit: Attending: Cardiovascular Disease | Admitting: Cardiovascular Disease

## 2023-12-17 ENCOUNTER — Encounter: Payer: Self-pay | Admitting: Cardiovascular Disease

## 2023-12-17 VITALS — BP 159/71 | HR 64 | Ht 67.0 in | Wt 180.0 lb

## 2023-12-17 DIAGNOSIS — I5032 Chronic diastolic (congestive) heart failure: Secondary | ICD-10-CM | POA: Diagnosis not present

## 2023-12-17 DIAGNOSIS — E782 Mixed hyperlipidemia: Secondary | ICD-10-CM | POA: Diagnosis not present

## 2023-12-17 DIAGNOSIS — R079 Chest pain, unspecified: Secondary | ICD-10-CM

## 2023-12-17 DIAGNOSIS — I4821 Permanent atrial fibrillation: Secondary | ICD-10-CM

## 2023-12-17 DIAGNOSIS — R600 Localized edema: Secondary | ICD-10-CM

## 2023-12-17 DIAGNOSIS — I10 Essential (primary) hypertension: Secondary | ICD-10-CM

## 2023-12-17 NOTE — Assessment & Plan Note (Signed)
 History of essential hypertension with blood pressure measured today at 159/71.  He is on amlodipine , carvedilol  and hydralazine .

## 2023-12-17 NOTE — Assessment & Plan Note (Signed)
 History of permanent A-fib rate controlled on Eliquis  oral and anticoagulation.  He has had multiple strokes in the past.

## 2023-12-17 NOTE — Assessment & Plan Note (Signed)
 3+ bilateral lower extremity edema probably related to chronic renal insufficiency with serum creatinine in the 6 range.  He has seen nephrologist.  I do not think he will be responsive to oral diuretics.  I doubt whether at his age he would be a candidate for dialysis.

## 2023-12-17 NOTE — Patient Instructions (Addendum)
 Medication Instructions:  Your physician recommends that you continue on your current medications as directed. Please refer to the Current Medication list given to you today.    *If you need a refill on your cardiac medications before your next appointment, please call your pharmacy*   Lab Work: NONE    If you have labs (blood work) drawn today and your tests are completely normal, you will receive your results only by: MyChart Message (if you have MyChart) OR A paper copy in the mail If you have any lab test that is abnormal or we need to change your treatment, we will call you to review the results.   Testing/Procedures: NONE    Follow-Up: At [facility], you and your health needs are our priority.  As part of our continuing mission to provide you with exceptional heart care, we have created designated Provider Care Teams.  These Care Teams include your primary Cardiologist (physician) and Advanced Practice Providers (APPs -  Physician Assistants and Nurse Practitioners) who all work together to provide you with the care you need, when you need it.  We recommend signing up for the patient portal called "MyChart".  Sign up information is provided on this After Visit Summary.  MyChart is used to connect with patients for Virtual Visits (Telemedicine).  Patients are able to view lab/test results, encounter notes, upcoming appointments, etc.  Non-urgent messages can be sent to your provider as well.   To learn more about what you can do with MyChart, go to [email or web address].    Your next appointment:   6 month(s)  The format for your next appointment:   In Person  Provider:   Charles Connor, NP       Other Instructions

## 2023-12-17 NOTE — Assessment & Plan Note (Signed)
 History of hyperlipidemia on statin therapy with lipid profile performed 11/22/2023 revealing total cholesterol 156, LDL of 84 and HDL 58.

## 2023-12-17 NOTE — Progress Notes (Signed)
 12/17/2023 Patrick [name]   February 01, 1946  [id number]  Primary Physician [name], Sid Dragon, NP Primary Cardiologist: Avanell Leigh MD Lathan Poke, [place], [place]  HPI:  Patrick [name] is a 78 y.o. thin frail and chronically ill-appearing married African-American male father of 4 with many grandchildren he was being referred back by his PCP because of lower extremity edema.  He has a history of treated hypertension, hyperlipidemia.  He is formally a patient of Dr. Lorelie Rohrer.  He has CKD with a serum creatinine in the 6 range.  Apparently he just saw a nephrologist.  He has 3+ lower extremity pitting edema.  He is minimally communicative.  His wife apparently has dementia.  He denies chest pain or shortness of breath.   Current Meds  Medication Sig   allopurinol  (ZYLOPRIM ) 100 MG tablet Take 0.5 tablets (50 mg total) by mouth every other day.   amLODipine  (NORVASC ) 10 MG tablet TAKE ONE TABLET BY MOUTH ONE TIME DAILY   apixaban  (ELIQUIS ) 2.5 MG TABS tablet Take 1 tablet (2.5 mg total) by mouth 2 (two) times daily.   atorvastatin  (LIPITOR) 20 MG tablet Take 1 tablet (20 mg total) by mouth daily.   calcitRIOL (ROCALTROL) 0.25 MCG capsule Take 0.25 mcg by mouth daily.   carvedilol  (COREG ) 12.5 MG tablet Take 1 tablet (12.5 mg total) by mouth 2 (two) times daily with a meal.   furosemide  (LASIX ) 20 MG tablet Take 1 tablet (20 mg total) by mouth daily for 5 days.   hydrALAZINE  (APRESOLINE ) 100 MG tablet Take 1 tablet (100 mg total) by mouth 3 (three) times daily.   isosorbide  mononitrate (IMDUR ) 30 MG 24 hr tablet Take 1 tablet (30 mg total) by mouth daily.     No Known Allergies  Social History   Socioeconomic History   Marital status: Married    Spouse name: [name]   Number of children: 4   Years of education: 5   Highest education level: GED or equivalent  Occupational History   Occupation: retired Theatre stage manager: UNEMPLOYED  Tobacco Use   Smoking status:  Former    Current packs/day: 0.00    Average packs/day: 0.5 packs/day for 15.0 years (7.5 ttl pk-yrs)    Types: Cigarettes    Start date: 10/26/1961    Quit date: 10/26/1976    Years since quitting: 47.1   Smokeless tobacco: Former   Tobacco comments:    07/14/2013 "quit chewing and dipping in ~ 1970"  Substance and Sexual Activity   Alcohol  use: No    Alcohol /week: 0.0 standard drinks of alcohol     Comment: 07/14/2013 "drank a little; quit in ~ 1970; never had problem w/it"   Drug use: No   Sexual activity: Not Currently  Other Topics Concern   Not on file  Social History Narrative   Former smoker - stopped 1978   Alcohol  stopped 1970   Full Code      Current Social History 10/07/2019        Patient lives with spouse [name]) and 1 son in a two level home.       Patient's method of transportation is via family member (Son or daughter-in-law).      The highest level of education was 5 th grade.      The patient currently retired Scientist, product/process development.      Identified important Relationships are Wife, sons, and grandchildren       Pets : None  Interests / Fun: Watch TV       Current Stressors: No bedroom downstairs. Has to climb 12 steps to bedroom.       Religious / Personal Beliefs: Christian       L. [place]atte, BSN, RN-BC       Social Drivers of Health   Financial Resource Strain: Not on file  Food Insecurity: No Food Insecurity (12/11/2023)   Hunger Vital Sign    Worried About Running Out of Food in the Last Year: Never true    Ran Out of Food in the Last Year: Never true  Transportation Needs: No Transportation Needs (12/11/2023)   PRAPARE - Administrator, Civil Service (Medical): No    Lack of Transportation (Non-Medical): No  Physical Activity: Inactive (10/12/2019)   Exercise Vital Sign    Days of Exercise per Week: 0 days    Minutes of Exercise per Session: 0 min  Stress: Not on file  Social Connections: Moderately Integrated (11/22/2023)    Social Connection and Isolation Panel [NHANES]    Frequency of Communication with Friends and Family: Twice a week    Frequency of Social Gatherings with Friends and Family: Twice a week    Attends Religious Services: More than 4 times per year    Active Member of [employer] or Organizations: No    Attends Banker Meetings: Never    Marital Status: Married  Catering manager Violence: Not At Risk (12/11/2023)   Humiliation, Afraid, Rape, and Kick questionnaire    Fear of Current or Ex-Partner: No    Emotionally Abused: No    Physically Abused: No    Sexually Abused: No     Review of Systems: General: negative for chills, fever, night sweats or weight changes.  Cardiovascular: negative for chest pain, dyspnea on exertion, edema, orthopnea, palpitations, paroxysmal nocturnal dyspnea or shortness of breath Dermatological: negative for rash Respiratory: negative for cough or wheezing Urologic: negative for hematuria Abdominal: negative for nausea, vomiting, diarrhea, bright red blood per rectum, melena, or hematemesis Neurologic: negative for visual changes, syncope, or dizziness All other systems reviewed and are otherwise negative except as noted above.    Blood pressure (!) 159/71, pulse 64, height 5\' 7"  (1.702 m), weight 180 lb (81.6 kg), SpO2 99%.  General appearance: alert and no distress Neck: no adenopathy, no carotid bruit, no JVD, supple, symmetrical, trachea midline, and thyroid  not enlarged, symmetric, no tenderness/mass/nodules Lungs: clear to auscultation bilaterally Heart: irregularly irregular rhythm Extremities: 3+ bilateral lower extremity edema Pulses: 2+ and symmetric Skin: Skin color, texture, turgor normal. No rashes or lesions Neurologic: Grossly normal  EKG atrial fibrillation with heart rate of 60, left bundle branch block.  I personally reviewed this EKG.      ASSESSMENT AND PLAN:   Hyperlipidemia History of hyperlipidemia on statin therapy with  lipid profile performed 11/22/2023 revealing total cholesterol 156, LDL of 84 and HDL 58.  Essential hypertension History of essential hypertension with blood pressure measured today at 159/71.  He is on amlodipine , carvedilol  and hydralazine .  Permanent atrial fibrillation (HCC) History of permanent A-fib rate controlled on Eliquis  oral and anticoagulation.  He has had multiple strokes in the past.     Avanell Leigh MD [facility], [facility] 12/17/2023 4:21 PM

## 2023-12-18 ENCOUNTER — Telehealth: Payer: Self-pay

## 2023-12-18 NOTE — Telephone Encounter (Signed)
 Please call   I do not advocate elderly patients changing medical doctors.  It looks like he sees a geriatric provider with [facility] and has many medical problems.  A stable medical home and primary care provider is usually helpful for complex geriatric patients.   If there is a specific barrier to care, then I can take over care, but I would recommend staying with the same long-term doctor.

## 2023-12-18 NOTE — Telephone Encounter (Signed)
 Copied from CRM [id number]. Topic: Appointments - Transfer of Care >> Dec 18, 2023  2:52 PM Jim Motts C wrote: Pt is requesting to transfer FROM: Monina [name], NP Pt is requesting to transfer TO: Dr. Scherrie Curt, MD Reason for requested transfer: Establish New Patient Care It is the responsibility of the team the patient would like to transfer to (Dr. Geralyn Knee) to reach out to the patient if for any reason this transfer is not acceptable.

## 2023-12-19 ENCOUNTER — Telehealth: Payer: Self-pay

## 2023-12-19 NOTE — Telephone Encounter (Signed)
 lvm for pt to call office

## 2023-12-24 ENCOUNTER — Other Ambulatory Visit: Payer: Self-pay | Admitting: Vascular Surgery

## 2023-12-24 ENCOUNTER — Encounter (HOSPITAL_COMMUNITY)

## 2023-12-24 DIAGNOSIS — N184 Chronic kidney disease, stage 4 (severe): Secondary | ICD-10-CM

## 2023-12-25 ENCOUNTER — Telehealth: Payer: Self-pay

## 2023-12-25 ENCOUNTER — Other Ambulatory Visit (HOSPITAL_COMMUNITY): Payer: Self-pay

## 2023-12-26 ENCOUNTER — Other Ambulatory Visit (HOSPITAL_COMMUNITY): Payer: Self-pay

## 2023-12-26 ENCOUNTER — Telehealth: Payer: Self-pay

## 2023-12-26 NOTE — Transitions of Care (Post Inpatient/ED Visit) (Signed)
 Care Management  Transitions of Care Program Transitions of Care Post-discharge Week #5  12/26/2023 Name: Patrick [name] MRN: [id number] DOB: 11-Jan-1946  Subjective: Patrick [name] is a 78 y.o. year old male who is a primary care patient of [name], [name], NP. The Care Management team was unable to reach the patient by phone to assess and address transitions of care needs.   Plan: Additional outreach attempts will be made to reach the patient enrolled in the [facility] Program (Post Inpatient/ED Visit).  Tonia Frankel RN, CCM [place]  VBCI-Population Health RN Care Manager [phone]

## 2023-12-27 ENCOUNTER — Telehealth: Payer: Self-pay

## 2023-12-27 NOTE — Transitions of Care (Post Inpatient/ED Visit) (Signed)
 Care Management  Transitions of Care Program Transitions of Care Post-discharge Week 5 TOC Closure   12/27/2023 Name: Patrick [name] MRN: [id number] DOB: Apr 24, 1946  Subjective: Patrick [name] is a 78 y.o. year old male who is a primary care patient of [name], [name], NP. The Care Management team was unable to reach the patient by phone to assess and address transitions of care needs.   Plan: No further outreach attempts will be made at this time.  We have been unable to reach the patient.  Tonia Frankel RN, CCM [place]  VBCI-Population Health RN Care Manager [phone]

## 2023-12-30 ENCOUNTER — Encounter: Admitting: Adult Health

## 2023-12-31 NOTE — Progress Notes (Signed)
 This encounter was created in error - please disregard.

## 2024-01-02 ENCOUNTER — Inpatient Hospital Stay: Admitting: Neurology

## 2024-01-08 NOTE — Progress Notes (Signed)
 Office Note     CC:  ESRD Requesting Provider:  Duncan Gibson*  HPI: Patrick [name] is a Right handed 78 y.o. (01/22/1946) male with kidney disease who presents at the request of Patrick [name]* for permanent HD access. The patient has had no prior access procedures.   On exam, Brant was made a few words.  He was accompanied by his wife, who was noted to have dementia, as well as his son who asked most of the questions. They understood his kidneys were not doing well, but seemed unsure while they were in my office otherwise.  Denied upper extremity surgeries.    Past Medical History:  Diagnosis Date   Atrial fibrillation (HCC)    CAD (coronary artery disease)    Chronic anticoagulation, on coumadin  07/17/2013   CKD (chronic kidney disease), stage II    CVA (cerebral infarction) 11/22/2010   Thalamic with residual memory loss and slow speech   Depression    Diabetes mellitus type II    Insulin  dependent   GERD (gastroesophageal reflux disease)    Gout    Hyperlipidemia    Hypertension    Myocardial infarction (HCC) 03/31/1999   near heart attack (07/14/2013)   Paroxysmal atrial fibrillation (HCC)    on coumadin    Permanent atrial fibrillation (HCC) 07/17/2013   Shortness of breath    can happen at any time (07/14/2013)   Sleep apnea 07/30/2010   has mask at home; seldom uses it (07/14/2013)   Stomach ulcer 03/30/1949   as a teenager   Stroke (HCC) ~ 2007; ~ 2009   memory not as good since (07/14/2013)   Stroke (HCC)    Tachy-brady syndrome (HCC) 07/17/2013   Upper GI bleeding 07/01/2013    Past Surgical History:  Procedure Laterality Date   CARDIAC CATHETERIZATION  07/2003   [name] notes 08/27/2005 (07/01/2013)    Social History   Socioeconomic History   Marital status: Married    Spouse name: Patrick [name]   Number of children: 4   Years of education: 5   Highest education level: GED or equivalent  Occupational History    Occupation: retired Theatre stage manager: UNEMPLOYED  Tobacco Use   Smoking status: Former    Current packs/day: 0.00    Average packs/day: 0.5 packs/day for 15.0 years (7.5 ttl pk-yrs)    Types: Cigarettes    Start date: 10/26/1961    Quit date: 10/26/1976    Years since quitting: 47.2   Smokeless tobacco: Former   Tobacco comments:    07/14/2013 quit chewing and dipping in ~ 1970  Substance and Sexual Activity   Alcohol  use: No    Alcohol /week: 0.0 standard drinks of alcohol     Comment: 07/14/2013 drank a little; quit in ~ 1970; never had problem w/it   Drug use: No   Sexual activity: Not Currently  Other Topics Concern   Not on file  Social History Narrative   Former smoker - stopped 1978   Alcohol  stopped 1970   Full Code      Current Social History 10/07/2019        Patient lives with spouse Patrick [name]) and 1 son in a two level home.       Patient's method of transportation is via family member (Son or daughter-in-law).      The highest level of education was 5 th grade.      The patient currently retired Scientist, product/process development.      Identified important Relationships  are Wife, sons, and grandchildren       Pets : None       Interests / Fun: Watch TV       Current Stressors: No bedroom downstairs. Has to climb 12 steps to bedroom.       Religious / Personal Beliefs: Christian       L. Ducatte, BSN, RN-BC       Social Drivers of Health   Financial Resource Strain: Not on file  Food Insecurity: No Food Insecurity (12/11/2023)   Hunger Vital Sign    Worried About Running Out of Food in the Last Year: Never true    Ran Out of Food in the Last Year: Never true  Transportation Needs: No Transportation Needs (12/11/2023)   PRAPARE - Administrator, Civil Service (Medical): No    Lack of Transportation (Non-Medical): No  Physical Activity: Inactive (10/12/2019)   Exercise Vital Sign    Days of Exercise per Week: 0 days    Minutes of Exercise per Session: 0 min   Stress: Not on file  Social Connections: Moderately Integrated (11/22/2023)   Social Connection and Isolation Panel [NHANES]    Frequency of Communication with Friends and Family: Twice a week    Frequency of Social Gatherings with Friends and Family: Twice a week    Attends Religious Services: More than 4 times per year    Active Member of [employer] or Organizations: No    Attends Banker Meetings: Never    Marital Status: Married  Catering manager Violence: Not At Risk (12/11/2023)   Humiliation, Afraid, Rape, and Kick questionnaire    Fear of Current or Ex-Partner: No    Emotionally Abused: No    Physically Abused: No    Sexually Abused: No   Family History  Problem Relation Age of Onset   Diabetes Mother    Hypertension Mother    Heart Problems Father    Hypertension Sister    Diabetes Sister    Diabetes Sister    Diabetes Sister    Cancer Brother     Current Outpatient Medications  Medication Sig Dispense Refill   allopurinol  (ZYLOPRIM ) 100 MG tablet Take 0.5 tablets (50 mg total) by mouth every other day. 30 tablet 1   amLODipine  (NORVASC ) 10 MG tablet TAKE ONE TABLET BY MOUTH ONE TIME DAILY 90 tablet 0   apixaban  (ELIQUIS ) 2.5 MG TABS tablet Take 1 tablet (2.5 mg total) by mouth 2 (two) times daily. 60 tablet 1   atorvastatin  (LIPITOR) 20 MG tablet Take 1 tablet (20 mg total) by mouth daily. 30 tablet 1   calcitRIOL (ROCALTROL) 0.25 MCG capsule Take 0.25 mcg by mouth daily.     carvedilol  (COREG ) 12.5 MG tablet Take 1 tablet (12.5 mg total) by mouth 2 (two) times daily with a meal. 60 tablet 1   furosemide  (LASIX ) 20 MG tablet Take 1 tablet (20 mg total) by mouth daily for 5 days. 5 tablet 0   hydrALAZINE  (APRESOLINE ) 100 MG tablet Take 1 tablet (100 mg total) by mouth 3 (three) times daily. 90 tablet 1   isosorbide  mononitrate (IMDUR ) 30 MG 24 hr tablet Take 1 tablet (30 mg total) by mouth daily. 30 tablet 1   No current facility-administered medications for  this visit.    No Known Allergies   REVIEW OF SYSTEMS:  [X]  denotes positive finding, [ ]  denotes negative finding Cardiac  Comments:  Chest pain or chest pressure:    Shortness  of breath upon exertion:    Short of breath when lying flat:    Irregular heart rhythm:        Vascular    Pain in calf, thigh, or hip brought on by ambulation:    Pain in feet at night that wakes you up from your sleep:     Blood clot in your veins:    Leg swelling:         Pulmonary    Oxygen at home:    Productive cough:     Wheezing:         Neurologic    Sudden weakness in arms or legs:     Sudden numbness in arms or legs:     Sudden onset of difficulty speaking or slurred speech:    Temporary loss of vision in one eye:     Problems with dizziness:         Gastrointestinal    Blood in stool:     Vomited blood:         Genitourinary    Burning when urinating:     Blood in urine:        Psychiatric    Major depression:         Hematologic    Bleeding problems:    Problems with blood clotting too easily:        Skin    Rashes or ulcers:        Constitutional    Fever or chills:      PHYSICAL EXAMINATION:  There were no vitals filed for this visit.  General:  WDWN in NAD; vital signs documented above Gait: Not observed HENT: WNL, normocephalic Pulmonary: normal non-labored breathing , without Rales, rhonchi,  wheezing Cardiac: regular HR, Abdomen: soft, NT, no masses Skin: without rashes Vascular Exam/Pulses:  Right Left  Radial absent absent  Ulnar absent absent  Brachial 2+ (normal) 2+ (normal)               Extremities: without ischemic changes, without Gangrene , without cellulitis; without open wounds;  Musculoskeletal: no muscle wasting or atrophy  Neurologic: A&O X 3;  No focal weakness or paresthesias are detected Psychiatric: Stoic   Non-Invasive Vascular Imaging:   +-----------------+-------------+----------+---------+  Right Cephalic   Diameter  (cm)Depth (cm)Findings   +-----------------+-------------+----------+---------+  Shoulder            0.30        0.82              +-----------------+-------------+----------+---------+  Prox upper arm       0.25        0.43              +-----------------+-------------+----------+---------+  Mid upper arm        0.22        0.33              +-----------------+-------------+----------+---------+  Dist upper arm       0.23        0.31              +-----------------+-------------+----------+---------+  Antecubital fossa    0.33        0.25   branching  +-----------------+-------------+----------+---------+  Prox forearm         0.21        0.43   branching  +-----------------+-------------+----------+---------+  Mid forearm          0.22        0.39              +-----------------+-------------+----------+---------+  Wrist               0.19        0.24              +-----------------+-------------+----------+---------+   +-----------------+-------------+----------+--------------+  Right Basilic    Diameter (cm)Depth (cm)   Findings     +-----------------+-------------+----------+--------------+  Shoulder                               not visualized  +-----------------+-------------+----------+--------------+  Prox upper arm                          not visualized  +-----------------+-------------+----------+--------------+  Mid upper arm        0.19        0.74                   +-----------------+-------------+----------+--------------+  Dist upper arm       0.17        0.66                   +-----------------+-------------+----------+--------------+  Antecubital fossa    0.14        0.33                   +-----------------+-------------+----------+--------------+  Prox forearm         0.14        0.49                   +-----------------+-------------+----------+--------------+  Mid forearm           0.17        0.33                   +-----------------+-------------+----------+--------------+  Wrist               0.38        0.42                   +-----------------+-------------+----------+--------------+   +-----------------+-------------+----------+---------+  Left Cephalic    Diameter (cm)Depth (cm)Findings   +-----------------+-------------+----------+---------+  Shoulder            0.20        0.42              +-----------------+-------------+----------+---------+  Prox upper arm       0.17        0.45              +-----------------+-------------+----------+---------+  Mid upper arm        0.16        0.31              +-----------------+-------------+----------+---------+  Dist upper arm       0.26        0.37   branching  +-----------------+-------------+----------+---------+  Antecubital fossa    0.16        0.42              +-----------------+-------------+----------+---------+  Prox forearm         0.15        0.41              +-----------------+-------------+----------+---------+  Mid forearm          0.25        0.19              +-----------------+-------------+----------+---------+  Wrist               0.20        0.23              +-----------------+-------------+----------+---------+   +-----------------+-------------+----------+--------------+  Left Basilic     Diameter (cm)Depth (cm)   Findings     +-----------------+-------------+----------+--------------+  Shoulder                               not visualized  +-----------------+-------------+----------+--------------+  Prox upper arm       0.20        1.05                   +-----------------+-------------+----------+--------------+  Mid upper arm        0.22        0.78                   +-----------------+-------------+----------+--------------+  Dist upper arm       0.33        0.66                    +-----------------+-------------+----------+--------------+  Antecubital fossa    0.18        0.49     branching     +-----------------+-------------+----------+--------------+  Prox forearm         0.10        0.33                   +-----------------+-------------+----------+--------------+  Mid forearm                             not visualized  +-----------------+-------------+----------+--------------+  Wrist                                  not visualized  +-----------------+-------------+----------+--------------+     ASSESSMENT/PLAN:  Ziaire Hagos is a 78 y.o. male who presents with chronic kidney disease stage 5  Based on vein mapping and examination, Prakash would be best served with AV graft. I had a long conversation with him and his family regarding the above.  This was difficult as I felt very little information was relayed due to his affect, his wife's dementia, and his sons comprehension. At this time, I feel like he would be best served with tunneled dialysis catheter should he need dialysis in the near future.  I have reached out to Dr. Higinio Love to let her know that I am happy to place a tunneled dialysis line down the road.  Once this is placed, I will revisit graft placement.   Kayla Part, MD [facility] [phone]

## 2024-01-09 ENCOUNTER — Ambulatory Visit: Attending: Vascular Surgery | Admitting: Vascular Surgery

## 2024-01-09 ENCOUNTER — Encounter: Payer: Self-pay | Admitting: Vascular Surgery

## 2024-01-09 ENCOUNTER — Ambulatory Visit (HOSPITAL_COMMUNITY)
Admission: RE | Admit: 2024-01-09 | Discharge: 2024-01-09 | Disposition: A | Discharge status: Home / Self Care | Source: Ambulatory Visit | Attending: Vascular Surgery | Admitting: Vascular Surgery

## 2024-01-09 VITALS — BP 157/60 | HR 59 | Temp 98.1°F | Resp 20 | Ht 67.0 in | Wt 177.2 lb

## 2024-01-09 DIAGNOSIS — N184 Chronic kidney disease, stage 4 (severe): Secondary | ICD-10-CM | POA: Diagnosis present

## 2024-01-09 DIAGNOSIS — N185 Chronic kidney disease, stage 5: Secondary | ICD-10-CM

## 2024-01-15 ENCOUNTER — Telehealth: Payer: Self-pay | Admitting: Pharmacy Technician

## 2024-01-15 ENCOUNTER — Encounter (HOSPITAL_COMMUNITY)
Admission: RE | Admit: 2024-01-15 | Discharge: 2024-01-15 | Disposition: A | Discharge status: Home / Self Care | Source: Ambulatory Visit | Attending: Internal Medicine | Admitting: Internal Medicine

## 2024-01-15 VITALS — BP 158/63 | HR 62 | Temp 97.6°F | Resp 17

## 2024-01-15 DIAGNOSIS — N184 Chronic kidney disease, stage 4 (severe): Secondary | ICD-10-CM | POA: Insufficient documentation

## 2024-01-15 DIAGNOSIS — D631 Anemia in chronic kidney disease: Secondary | ICD-10-CM | POA: Diagnosis not present

## 2024-01-15 DIAGNOSIS — D649 Anemia, unspecified: Secondary | ICD-10-CM | POA: Insufficient documentation

## 2024-01-15 LAB — RENAL FUNCTION PANEL
Albumin: 3.5 g/dL (ref 3.5–5.0)
Anion gap: 13 (ref 5–15)
BUN: 103 mg/dL — ABNORMAL HIGH (ref 8–23)
CO2: 20 mmol/L — ABNORMAL LOW (ref 22–32)
Calcium: 8.7 mg/dL — ABNORMAL LOW (ref 8.9–10.3)
Chloride: 106 mmol/L (ref 98–111)
Creatinine, Ser: 6.28 mg/dL — ABNORMAL HIGH (ref 0.61–1.24)
GFR, Estimated: 9 mL/min — ABNORMAL LOW (ref >60)
Glucose, Bld: 101 mg/dL — ABNORMAL HIGH (ref 70–99)
Phosphorus: 4.4 mg/dL (ref 2.5–4.6)
Potassium: 4 mmol/L (ref 3.5–5.1)
Sodium: 139 mmol/L (ref 135–145)

## 2024-01-15 LAB — FERRITIN: Ferritin: 280 ng/mL (ref 24–336)

## 2024-01-15 LAB — IRON AND TIBC
Iron: 56 ug/dL (ref 45–182)
Saturation Ratios: 27 % (ref 17.9–39.5)
TIBC: 211 ug/dL — ABNORMAL LOW (ref 250–450)
UIBC: 155 ug/dL

## 2024-01-15 MED ORDER — EPOETIN ALFA-EPBX 10000 UNIT/ML IJ SOLN
INTRAMUSCULAR | Status: AC
  Filled 2024-01-15: qty 2

## 2024-01-15 MED ORDER — EPOETIN ALFA-EPBX 10000 UNIT/ML IJ SOLN
20000.0000 [IU] | INTRAMUSCULAR | Status: DC
  Administered 2024-01-15: 20000 [IU] via SUBCUTANEOUS

## 2024-01-15 NOTE — Telephone Encounter (Signed)
 Auth Submission: NO AUTH NEEDED Site of care: Site of care: [facility] Payer: UHC MEDICARE Medication & CPT/J Code(s) submitted: RETACRIT  - W0981 Diagnosis Code:  Route of submission (phone, fax, portal):  Phone # Fax # Auth type: Buy/Bill PB Units/visits requested: 20,000 UNITS Q14 D Reference number:  Approval from: 01/15/24 to 07/29/24

## 2024-01-15 NOTE — Progress Notes (Signed)
 Patient here for retacrit   Hgb 7.6   11.5 in April;  Denies any CP, SHOB, any bleeding in stools or urine   Son states that patient has not been receiving his Iron tablets.  Reported all the above to [facility]    Orders received to give Retacrit  20000u today and increase visits to every week.  Son verbalized understanding

## 2024-01-16 LAB — PTH, INTACT AND CALCIUM
Calcium, Total (PTH): 8.5 mg/dL — ABNORMAL LOW (ref 8.6–10.2)
PTH: 365 pg/mL — ABNORMAL HIGH (ref 15–65)

## 2024-01-17 LAB — POCT HEMOGLOBIN-HEMACUE: Hemoglobin: 7.6 g/dL — ABNORMAL LOW (ref 13.0–17.0)

## 2024-01-18 ENCOUNTER — Other Ambulatory Visit: Payer: Self-pay | Admitting: Adult Health

## 2024-01-24 ENCOUNTER — Other Ambulatory Visit: Payer: Self-pay

## 2024-01-24 ENCOUNTER — Other Ambulatory Visit (HOSPITAL_COMMUNITY): Payer: Self-pay

## 2024-01-26 ENCOUNTER — Other Ambulatory Visit: Payer: Self-pay | Admitting: Adult Health

## 2024-01-27 ENCOUNTER — Other Ambulatory Visit: Payer: Self-pay | Admitting: Adult Health

## 2024-01-27 ENCOUNTER — Other Ambulatory Visit (HOSPITAL_COMMUNITY): Payer: Self-pay

## 2024-01-27 DIAGNOSIS — I4821 Permanent atrial fibrillation: Secondary | ICD-10-CM

## 2024-01-27 MED ORDER — APIXABAN 2.5 MG PO TABS: 2.5000 mg | ORAL_TABLET | Freq: Two times a day (BID) | ORAL | 1 refills | Status: DC

## 2024-01-27 NOTE — Telephone Encounter (Signed)
 Pt was seen in May , did you still want him to have an appointment in order to refill medication ?

## 2024-01-29 ENCOUNTER — Telehealth: Admitting: Adult Health

## 2024-01-29 ENCOUNTER — Encounter (HOSPITAL_COMMUNITY)
Admission: RE | Admit: 2024-01-29 | Discharge: 2024-01-29 | Disposition: A | Discharge status: Home / Self Care | Source: Ambulatory Visit | Attending: Internal Medicine | Admitting: Internal Medicine

## 2024-01-29 VITALS — BP 170/65 | HR 62 | Temp 97.2°F | Resp 16

## 2024-01-29 DIAGNOSIS — D649 Anemia, unspecified: Secondary | ICD-10-CM | POA: Insufficient documentation

## 2024-01-29 DIAGNOSIS — N184 Chronic kidney disease, stage 4 (severe): Secondary | ICD-10-CM | POA: Insufficient documentation

## 2024-01-29 LAB — POCT HEMOGLOBIN-HEMACUE: Hemoglobin: 8.3 g/dL — ABNORMAL LOW (ref 13.0–17.0)

## 2024-01-29 MED ORDER — ISOSORBIDE MONONITRATE ER 30 MG PO TB24: 30.0000 mg | ORAL_TABLET | Freq: Every day | ORAL | 1 refills | Status: DC

## 2024-01-29 MED ORDER — EPOETIN ALFA-EPBX 10000 UNIT/ML IJ SOLN
20000.0000 [IU] | INTRAMUSCULAR | Status: DC
  Administered 2024-01-29: 20000 [IU] via SUBCUTANEOUS

## 2024-01-29 MED ORDER — EPOETIN ALFA-EPBX 10000 UNIT/ML IJ SOLN
INTRAMUSCULAR | Status: AC
  Filled 2024-01-29: qty 2

## 2024-01-29 NOTE — Telephone Encounter (Signed)
 Copied from CRM [id number]. Topic: Clinical - Medication Refill >> Jan 29, 2024  3:45 PM Carmell R wrote: Medication: isosorbide  mononitrate (IMDUR ) 30 MG 24 hr tablet  Has the patient contacted their pharmacy? Yes. Says they sent a request for a new rx. No refills left.  This is the patient's preferred pharmacy:  [facility] [address] [facility] - [place], [place] - [address] [facility] AT [facility] [address] [place] [address] Phone: [phone] Fax: [phone]  Is this the correct pharmacy for this prescription? Yes  Has the prescription been filled recently? Yes  Is the patient out of the medication? Yes, been out for 6 days  Has the patient been seen for an appointment in the last year OR does the patient have an upcoming appointment? Yes  Can we respond through MyChart? Yes  Agent: Please be advised that Rx refills may take up to 3 business days. We ask that you follow-up with your pharmacy.

## 2024-01-30 ENCOUNTER — Other Ambulatory Visit (HOSPITAL_COMMUNITY): Payer: Self-pay

## 2024-01-30 NOTE — Telephone Encounter (Unsigned)
 Copied from CRM [id number]. Topic: Clinical - Medication Refill >> Jan 30, 2024  1:55 PM Suzette B wrote: Medication:  allopurinol  (ZYLOPRIM ) 100 MG tablet amLODipine  (NORVASC ) 10 MG tablet atcarvedilol (COREG ) 12.5 MG tablet carvedilol  (COREG ) 12.5 MG tablet hydrALAZINE  (APRESOLINE ) 100 MG tablet  Has the patient contacted their pharmacy? Yes: Patient's son stated the pharmacy advised him that the prescription request needed to be called in for refill     This is the patient's preferred pharmacy:  [facility] [address] [facility] - [place], [place] - [address] [facility] AT [facility] [address] [place] [address] Phone: [phone] Fax: [phone]    Is this the correct pharmacy for this prescription? Yes If no, delete pharmacy and type the correct one.   Has the prescription been filled recently? Yes  Is the patient out of the medication?yes  Has the patient been seen for an appointment in the last year OR does the patient have an upcoming appointment? Yes  Can we respond through MyChart? Yes  Agent: Please be advised that Rx refills may take up to 3 business days. We ask that you follow-up with your pharmacy.

## 2024-02-03 ENCOUNTER — Other Ambulatory Visit: Payer: Self-pay | Admitting: Adult Health

## 2024-02-03 ENCOUNTER — Encounter: Payer: Self-pay | Admitting: Adult Health

## 2024-02-03 ENCOUNTER — Other Ambulatory Visit: Payer: Self-pay

## 2024-02-03 DIAGNOSIS — I10 Essential (primary) hypertension: Secondary | ICD-10-CM

## 2024-02-03 MED ORDER — CARVEDILOL 12.5 MG PO TABS: 12.5000 mg | ORAL_TABLET | Freq: Two times a day (BID) | ORAL | 1 refills | Status: DC

## 2024-02-03 MED ORDER — AMLODIPINE BESYLATE 10 MG PO TABS: 10.0000 mg | ORAL_TABLET | Freq: Every day | ORAL | 0 refills | Status: DC

## 2024-02-03 NOTE — Telephone Encounter (Signed)
 Copied from CRM [id number]. Topic: Clinical - Medication Refill >> Feb 03, 2024  3:17 PM Dominique A wrote: Medication: carvedilol  (COREG ) 12.5 MG tablet amLODipine  (NORVASC ) 10 MG tablet   Has the patient contacted their pharmacy? Yes Patient has not gotten a response.   This is the patient's preferred pharmacy:  [facility] [address] [facility] - [place], [place] - [address] [facility] AT [facility] [address] [place] [address] Phone: [phone] Fax: [phone]  Is this the correct pharmacy for this prescription? Yes   Has the prescription been filled recently? No  Is the patient out of the medication? Yes Jashua Knaak is wanting to know if the patient still needs to take carvedilol ?  Has the patient been seen for an appointment in the last year OR does the patient have an upcoming appointment? Yes  Can we respond through MyChart? No  Agent: Please be advised that Rx refills may take up to 3 business days. We ask that you follow-up with your pharmacy.

## 2024-02-03 NOTE — Telephone Encounter (Signed)
eRx sent to pharmacy

## 2024-02-12 ENCOUNTER — Encounter (HOSPITAL_COMMUNITY)

## 2024-02-12 ENCOUNTER — Encounter (HOSPITAL_COMMUNITY)
Admission: RE | Admit: 2024-02-12 | Discharge: 2024-02-12 | Disposition: A | Discharge status: Home / Self Care | Source: Ambulatory Visit | Attending: Internal Medicine | Admitting: Internal Medicine

## 2024-02-12 VITALS — BP 139/69 | HR 61 | Temp 97.1°F | Resp 16

## 2024-02-12 DIAGNOSIS — D649 Anemia, unspecified: Secondary | ICD-10-CM | POA: Diagnosis not present

## 2024-02-12 DIAGNOSIS — N184 Chronic kidney disease, stage 4 (severe): Secondary | ICD-10-CM

## 2024-02-12 LAB — IRON AND TIBC
Iron: 28 ug/dL — ABNORMAL LOW (ref 45–182)
Saturation Ratios: 15 % — ABNORMAL LOW (ref 17.9–39.5)
TIBC: 186 ug/dL — ABNORMAL LOW (ref 250–450)
UIBC: 158 ug/dL

## 2024-02-12 LAB — RENAL FUNCTION PANEL
Albumin: 3.4 g/dL — ABNORMAL LOW (ref 3.5–5.0)
Anion gap: 13 (ref 5–15)
BUN: 104 mg/dL — ABNORMAL HIGH (ref 8–23)
CO2: 18 mmol/L — ABNORMAL LOW (ref 22–32)
Calcium: 8.1 mg/dL — ABNORMAL LOW (ref 8.9–10.3)
Chloride: 107 mmol/L (ref 98–111)
Creatinine, Ser: 5.75 mg/dL — ABNORMAL HIGH (ref 0.61–1.24)
GFR, Estimated: 10 mL/min — ABNORMAL LOW (ref >60)
Glucose, Bld: 126 mg/dL — ABNORMAL HIGH (ref 70–99)
Phosphorus: 4.3 mg/dL (ref 2.5–4.6)
Potassium: 3.9 mmol/L (ref 3.5–5.1)
Sodium: 138 mmol/L (ref 135–145)

## 2024-02-12 LAB — FERRITIN: Ferritin: 209 ng/mL (ref 24–336)

## 2024-02-12 MED ORDER — EPOETIN ALFA-EPBX 10000 UNIT/ML IJ SOLN
INTRAMUSCULAR | Status: AC
  Filled 2024-02-12: qty 2

## 2024-02-12 MED ORDER — EPOETIN ALFA-EPBX 10000 UNIT/ML IJ SOLN
20000.0000 [IU] | INTRAMUSCULAR | Status: DC
  Administered 2024-02-12: 20000 [IU] via SUBCUTANEOUS

## 2024-02-12 NOTE — Progress Notes (Signed)
 Patient with Hgb of 7.8 and 7.9 per hemocue, without any symptoms. Spoke with Hospital doctor at [facility], was advised to give patient Retacrit  as ordered. Patient verbalized understanding and agreed with plan.

## 2024-02-13 LAB — PTH, INTACT AND CALCIUM
Calcium, Total (PTH): 7.9 mg/dL — ABNORMAL LOW (ref 8.6–10.2)
PTH: 384 pg/mL — ABNORMAL HIGH (ref 15–65)

## 2024-02-13 LAB — POCT HEMOGLOBIN-HEMACUE: Hemoglobin: 7.9 g/dL — ABNORMAL LOW (ref 13.0–17.0)

## 2024-02-17 ENCOUNTER — Other Ambulatory Visit: Payer: Self-pay | Admitting: Adult Health

## 2024-02-18 ENCOUNTER — Other Ambulatory Visit: Payer: Self-pay | Admitting: Orthopedic Surgery

## 2024-02-18 DIAGNOSIS — I4821 Permanent atrial fibrillation: Secondary | ICD-10-CM

## 2024-02-18 MED ORDER — APIXABAN 2.5 MG PO TABS: 2.5000 mg | ORAL_TABLET | Freq: Two times a day (BID) | ORAL | 1 refills | Status: DC

## 2024-02-18 NOTE — Telephone Encounter (Signed)
 Noted

## 2024-02-18 NOTE — Telephone Encounter (Signed)
 Patient medication was discontinued 11/25/2023.

## 2024-02-20 ENCOUNTER — Inpatient Hospital Stay: Admitting: Neurology

## 2024-02-26 ENCOUNTER — Encounter (HOSPITAL_COMMUNITY)
Admission: RE | Admit: 2024-02-26 | Discharge: 2024-02-26 | Disposition: A | Discharge status: Home / Self Care | Source: Ambulatory Visit | Attending: Internal Medicine | Admitting: Internal Medicine

## 2024-02-26 DIAGNOSIS — D649 Anemia, unspecified: Secondary | ICD-10-CM | POA: Diagnosis not present

## 2024-02-26 DIAGNOSIS — N184 Chronic kidney disease, stage 4 (severe): Secondary | ICD-10-CM

## 2024-02-26 LAB — POCT HEMOGLOBIN-HEMACUE: Hemoglobin: 7.9 g/dL — ABNORMAL LOW (ref 13.0–17.0)

## 2024-02-26 MED ORDER — EPOETIN ALFA-EPBX 40000 UNIT/ML IJ SOLN
INTRAMUSCULAR | Status: AC
  Filled 2024-02-26: qty 1

## 2024-02-26 MED ORDER — EPOETIN ALFA-EPBX 10000 UNIT/ML IJ SOLN: 20000.0000 [IU] | INTRAMUSCULAR | Status: DC

## 2024-02-26 MED ORDER — EPOETIN ALFA-EPBX 40000 UNIT/ML IJ SOLN
40000.0000 [IU] | Freq: Once | INTRAMUSCULAR | Status: AC
  Administered 2024-02-26: 40000 [IU] via SUBCUTANEOUS

## 2024-02-26 MED ORDER — EPOETIN ALFA-EPBX 10000 UNIT/ML IJ SOLN
INTRAMUSCULAR | Status: AC
  Filled 2024-02-26: qty 2

## 2024-02-26 NOTE — Progress Notes (Signed)
 Pt here today for retacrit  injection.  Hemocue 7.9 today, 7.8 2 weeks ago.  PT denies any signs and symptoms of bleeding.  Reported the above to [place] [facility].  Orders received to incease retacrit  to 40,000u every 2 weeks

## 2024-02-28 ENCOUNTER — Encounter: Payer: Self-pay | Admitting: Adult Health

## 2024-03-03 NOTE — Telephone Encounter (Signed)
 Yes,  Mr Madara has a new patient appointment 03/04/24 with Dr. Watt.

## 2024-03-03 NOTE — Progress Notes (Deleted)
     Patrick Happ T. Jacquette Canales, MD, CAQ Sports Medicine [facility] [address] [place], [address]  Phone: [phone]  FAX: [phone]  Patrick [name] - 78 y.o. male  MRN [id number]  Date of Birth: 1946/05/02  Date: 03/04/2024  PCP: Phyllis Jereld BROCKS, NP  Referral: [name], [name]*  No chief complaint on file.  Subjective:   Patrick [name] is a 78 y.o. very pleasant male patient with There is no height or weight on file to calculate BMI. who presents with the following:  The patient presents as a transfer of care case to me.  I saw his wife on Monday.  He has been seeing [facility], geriatrics for an extended period of time.  He has a complicated medical history, and he was recently admitted to the hospital on November 21, 2023.  Prior to seeing [facility] care, it looks as if he actually was recently seen by a different [place] office as well as [place] internal medicine for an extended period of time.  He was admitted with an acute embolic stroke in July 2023.  He has an extremely complicated medical history.  History of multiple strokes.  It does not look like he is followed by neurology?  Here history of coronary disease as well as systolic heart failure.  He does see Dr. Wadie from cardiology.  Chronic A-fib.  Chronic kidney disease stage V.  Recent creatinine was actually above 6 with a GFR of 9.  Recently saw vascular surgery He does see Dr. Norine from nephrology It looks like the plan is for hemodialysis    Review of Systems is noted in the HPI, as appropriate  Objective:   There were no vitals taken for this visit.  GEN: No acute distress; alert,appropriate. PULM: Breathing comfortably in no respiratory distress PSYCH: Normally interactive.   Laboratory and Imaging Data:  Assessment and Plan:   ***

## 2024-03-04 ENCOUNTER — Ambulatory Visit: Admitting: Family Medicine

## 2024-03-08 ENCOUNTER — Encounter: Payer: Self-pay | Admitting: Family Medicine

## 2024-03-08 NOTE — Progress Notes (Unsigned)
     Verenice Westrich T. [place] Ducre, MD, CAQ Sports Medicine [facility] at [facility] [address] [place], [address]  Phone: [phone]  FAX: [phone]  Patrick [name] - 78 y.o. male  MRN [id number]  Date of Birth: 12/26/1945  Date: 03/09/2024  PCP: Phyllis Jereld BROCKS, NP  Referral: [name], [name]*  No chief complaint on file.  Subjective:   Patrick [name] is a 78 y.o. very pleasant male patient with There is no height or weight on file to calculate BMI. who presents with the following:  The patient presents as a transfer of care case to me.  I saw his wife on Monday.  He has been seeing [facility], geriatrics for an extended period of time.  He has a complicated medical history, and he was recently admitted to the hospital on November 21, 2023.  Prior to seeing [facility] care, it looks as if he actually was recently seen by a different [place] office as well as [place] internal medicine for an extended period of time.  He was admitted with an acute embolic stroke in July 2023.  He has an extremely complicated medical history.  History of multiple strokes.  It does not look like he is followed by neurology?  Here history of coronary disease as well as systolic heart failure.  He does see Dr. Wadie from cardiology.  Chronic A-fib.  Chronic kidney disease stage V.  Recent creatinine was actually above 6 with a GFR of 9.  Recently saw vascular surgery He does see Dr. Norine from nephrology It looks like the plan is for hemodialysis    Review of Systems is noted in the HPI, as appropriate  Objective:   There were no vitals taken for this visit.  GEN: No acute distress; alert,appropriate. PULM: Breathing comfortably in no respiratory distress PSYCH: Normally interactive.   Laboratory and Imaging Data:  Assessment and Plan:   ***

## 2024-03-09 ENCOUNTER — Ambulatory Visit (INDEPENDENT_AMBULATORY_CARE_PROVIDER_SITE_OTHER): Admitting: Family Medicine

## 2024-03-09 ENCOUNTER — Encounter: Payer: Self-pay | Admitting: Family Medicine

## 2024-03-09 VITALS — BP 140/68 | HR 65 | Temp 97.5°F | Ht 67.25 in | Wt 168.5 lb

## 2024-03-09 DIAGNOSIS — Z8673 Personal history of transient ischemic attack (TIA), and cerebral infarction without residual deficits: Secondary | ICD-10-CM | POA: Diagnosis not present

## 2024-03-09 DIAGNOSIS — Z7901 Long term (current) use of anticoagulants: Secondary | ICD-10-CM

## 2024-03-09 DIAGNOSIS — I4821 Permanent atrial fibrillation: Secondary | ICD-10-CM | POA: Diagnosis not present

## 2024-03-09 DIAGNOSIS — I10 Essential (primary) hypertension: Secondary | ICD-10-CM | POA: Diagnosis not present

## 2024-03-09 DIAGNOSIS — E782 Mixed hyperlipidemia: Secondary | ICD-10-CM | POA: Diagnosis not present

## 2024-03-09 DIAGNOSIS — E1165 Type 2 diabetes mellitus with hyperglycemia: Secondary | ICD-10-CM | POA: Diagnosis not present

## 2024-03-09 DIAGNOSIS — D649 Anemia, unspecified: Secondary | ICD-10-CM

## 2024-03-09 DIAGNOSIS — I2510 Atherosclerotic heart disease of native coronary artery without angina pectoris: Secondary | ICD-10-CM | POA: Diagnosis not present

## 2024-03-09 DIAGNOSIS — I5022 Chronic systolic (congestive) heart failure: Secondary | ICD-10-CM | POA: Diagnosis not present

## 2024-03-09 DIAGNOSIS — N185 Chronic kidney disease, stage 5: Secondary | ICD-10-CM | POA: Diagnosis not present

## 2024-03-09 DIAGNOSIS — I63413 Cerebral infarction due to embolism of bilateral middle cerebral arteries: Secondary | ICD-10-CM | POA: Diagnosis not present

## 2024-03-11 ENCOUNTER — Encounter (HOSPITAL_COMMUNITY)
Admission: RE | Admit: 2024-03-11 | Discharge: 2024-03-11 | Disposition: A | Discharge status: Home / Self Care | Source: Ambulatory Visit | Attending: Internal Medicine | Admitting: Internal Medicine

## 2024-03-11 VITALS — BP 175/66 | HR 68 | Temp 97.1°F | Resp 16

## 2024-03-11 DIAGNOSIS — D631 Anemia in chronic kidney disease: Secondary | ICD-10-CM | POA: Insufficient documentation

## 2024-03-11 DIAGNOSIS — N185 Chronic kidney disease, stage 5: Secondary | ICD-10-CM | POA: Insufficient documentation

## 2024-03-11 DIAGNOSIS — D649 Anemia, unspecified: Secondary | ICD-10-CM

## 2024-03-11 LAB — IRON AND TIBC
Iron: 15 ug/dL — ABNORMAL LOW (ref 45–182)
Saturation Ratios: 8 % — ABNORMAL LOW (ref 17.9–39.5)
TIBC: 182 ug/dL — ABNORMAL LOW (ref 250–450)
UIBC: 167 ug/dL

## 2024-03-11 LAB — RENAL FUNCTION PANEL
Albumin: 3.4 g/dL — ABNORMAL LOW (ref 3.5–5.0)
Anion gap: 12 (ref 5–15)
BUN: 96 mg/dL — ABNORMAL HIGH (ref 8–23)
CO2: 19 mmol/L — ABNORMAL LOW (ref 22–32)
Calcium: 8.5 mg/dL — ABNORMAL LOW (ref 8.9–10.3)
Chloride: 109 mmol/L (ref 98–111)
Creatinine, Ser: 5.58 mg/dL — ABNORMAL HIGH (ref 0.61–1.24)
GFR, Estimated: 10 mL/min — ABNORMAL LOW (ref >60)
Glucose, Bld: 148 mg/dL — ABNORMAL HIGH (ref 70–99)
Phosphorus: 3.8 mg/dL (ref 2.5–4.6)
Potassium: 3.7 mmol/L (ref 3.5–5.1)
Sodium: 140 mmol/L (ref 135–145)

## 2024-03-11 LAB — POCT HEMOGLOBIN-HEMACUE: Hemoglobin: 8.5 g/dL — ABNORMAL LOW (ref 13.0–17.0)

## 2024-03-11 LAB — FERRITIN: Ferritin: 197 ng/mL (ref 24–336)

## 2024-03-11 MED ORDER — EPOETIN ALFA-EPBX 40000 UNIT/ML IJ SOLN
40000.0000 [IU] | INTRAMUSCULAR | Status: DC
  Administered 2024-03-11 (×2): 40000 [IU] via SUBCUTANEOUS

## 2024-03-11 MED ORDER — EPOETIN ALFA-EPBX 40000 UNIT/ML IJ SOLN
INTRAMUSCULAR | Status: AC
  Filled 2024-03-11: qty 1

## 2024-03-17 ENCOUNTER — Telehealth (HOSPITAL_COMMUNITY): Payer: Self-pay

## 2024-03-17 DIAGNOSIS — N185 Chronic kidney disease, stage 5: Secondary | ICD-10-CM | POA: Diagnosis not present

## 2024-03-17 DIAGNOSIS — N2581 Secondary hyperparathyroidism of renal origin: Secondary | ICD-10-CM | POA: Diagnosis not present

## 2024-03-17 DIAGNOSIS — I509 Heart failure, unspecified: Secondary | ICD-10-CM | POA: Diagnosis not present

## 2024-03-17 DIAGNOSIS — D649 Anemia, unspecified: Secondary | ICD-10-CM | POA: Diagnosis not present

## 2024-03-17 DIAGNOSIS — I120 Hypertensive chronic kidney disease with stage 5 chronic kidney disease or end stage renal disease: Secondary | ICD-10-CM | POA: Diagnosis not present

## 2024-03-17 NOTE — Telephone Encounter (Signed)
 Auth Submission: NO AUTH NEEDED Site of care: [facility] of care: [facility] Payer: UHC Medicare Medication & CPT/J Code(s) submitted: Feraheme (ferumoxytol ) [id number] Diagnosis Code: D63.1 Route of submission (phone, fax, portal):  Phone # Fax # Auth type: Buy/Bill HB Units/visits requested: 510mg  x 2 doses Approval from: 03/17/24 to 07/17/24

## 2024-03-25 ENCOUNTER — Encounter (HOSPITAL_COMMUNITY)

## 2024-03-26 ENCOUNTER — Other Ambulatory Visit: Payer: Self-pay | Admitting: Family Medicine

## 2024-03-26 MED ORDER — ISOSORBIDE MONONITRATE ER 30 MG PO TB24: 30.0000 mg | ORAL_TABLET | Freq: Every day | ORAL | 1 refills | Status: AC

## 2024-03-26 NOTE — Telephone Encounter (Signed)
 Copied from CRM [id number]. Topic: Clinical - Medication Refill >> Mar 26, 2024  4:15 PM Jayma L wrote: Medication: isosorbide  mononitrate (IMDUR ) 30 MG 24 hr tablet  Has the patient contacted their pharmacy? No (Agent: If no, request that the patient contact the pharmacy for the refill. If patient does not wish to contact the pharmacy document the reason why and proceed with request.) (Agent: If yes, when and what did the pharmacy advise?)  This is the patient's preferred pharmacy:  [facility] [address] [facility] - [place], [place] - [address] [facility] [address] [facility] [address] [place] [address] Phone: [phone] Fax: [phone]   Is this the correct pharmacy for this prescription? Yes If no, delete pharmacy and type the correct one.   Has the prescription been filled recently? No  Is the patient out of the medication? No  Has the patient been seen for an appointment in the last year OR does the patient have an upcoming appointment? Yes  Can we respond through MyChart? No  Agent: Please be advised that Rx refills may take up to 3 business days. We ask that you follow-up with your pharmacy.

## 2024-03-31 ENCOUNTER — Encounter: Payer: Self-pay | Admitting: Neurology

## 2024-03-31 ENCOUNTER — Inpatient Hospital Stay: Admitting: Neurology

## 2024-04-02 ENCOUNTER — Other Ambulatory Visit (HOSPITAL_COMMUNITY): Payer: Self-pay | Admitting: *Deleted

## 2024-04-02 ENCOUNTER — Other Ambulatory Visit: Payer: Self-pay | Admitting: Adult Health

## 2024-04-02 DIAGNOSIS — I10 Essential (primary) hypertension: Secondary | ICD-10-CM

## 2024-04-03 ENCOUNTER — Ambulatory Visit (HOSPITAL_COMMUNITY)
Admission: RE | Admit: 2024-04-03 | Discharge: 2024-04-03 | Disposition: A | Discharge status: Home / Self Care | Source: Ambulatory Visit | Attending: Internal Medicine | Admitting: Internal Medicine

## 2024-04-03 VITALS — BP 174/80 | HR 68 | Temp 97.5°F | Resp 16

## 2024-04-03 DIAGNOSIS — D649 Anemia, unspecified: Secondary | ICD-10-CM

## 2024-04-03 DIAGNOSIS — D631 Anemia in chronic kidney disease: Secondary | ICD-10-CM | POA: Insufficient documentation

## 2024-04-03 DIAGNOSIS — N185 Chronic kidney disease, stage 5: Secondary | ICD-10-CM | POA: Insufficient documentation

## 2024-04-03 LAB — POCT HEMOGLOBIN-HEMACUE: Hemoglobin: 8.6 g/dL — ABNORMAL LOW (ref 13.0–17.0)

## 2024-04-03 MED ORDER — EPOETIN ALFA-EPBX 40000 UNIT/ML IJ SOLN
40000.0000 [IU] | INTRAMUSCULAR | Status: DC
  Administered 2024-04-03: 40000 [IU] via SUBCUTANEOUS

## 2024-04-03 MED ORDER — EPOETIN ALFA-EPBX 40000 UNIT/ML IJ SOLN
INTRAMUSCULAR | Status: AC
  Filled 2024-04-03: qty 1

## 2024-04-04 ENCOUNTER — Other Ambulatory Visit: Payer: Self-pay | Admitting: Adult Health

## 2024-04-04 DIAGNOSIS — I10 Essential (primary) hypertension: Secondary | ICD-10-CM

## 2024-04-07 ENCOUNTER — Other Ambulatory Visit: Payer: Self-pay | Admitting: Adult Health

## 2024-04-07 ENCOUNTER — Other Ambulatory Visit: Payer: Self-pay | Admitting: Family Medicine

## 2024-04-07 DIAGNOSIS — I10 Essential (primary) hypertension: Secondary | ICD-10-CM

## 2024-04-07 NOTE — Telephone Encounter (Signed)
 Copied from CRM [id number]. Topic: Clinical - Medication Refill >> Apr 07, 2024  1:42 PM Donna BRAVO wrote: Patient son Elspeth requesting refill, prescription from Dr Watt   Medication: carvedilol  (COREG ) 12.5 MG tablet   Has the patient contacted their pharmacy? Yes Pharmacy needs a new prescription from Dr Watt sent in    This is the patient's preferred pharmacy:  [facility] [address] [facility] - [place], [place] - [address] [facility] AT [facility] [address] [place] [address] Phone: [phone] Fax: [phone]   Is this the correct pharmacy for this prescription? Yes If no, delete pharmacy and type the correct one.   Has the prescription been filled recently? Yes  Is the patient out of the medication? Yes  Has the patient been seen for an appointment in the last year OR does the patient have an upcoming appointment? Yes  Can we respond through MyChart? No  Agent: Please be advised that Rx refills may take up to 3 business days. We ask that you follow-up with your pharmacy.

## 2024-04-08 ENCOUNTER — Other Ambulatory Visit: Payer: Self-pay | Admitting: Family Medicine

## 2024-04-08 ENCOUNTER — Encounter (HOSPITAL_COMMUNITY)

## 2024-04-08 ENCOUNTER — Encounter: Payer: Self-pay | Admitting: Family Medicine

## 2024-04-08 DIAGNOSIS — R600 Localized edema: Secondary | ICD-10-CM

## 2024-04-08 DIAGNOSIS — M109 Gout, unspecified: Secondary | ICD-10-CM

## 2024-04-08 DIAGNOSIS — N185 Chronic kidney disease, stage 5: Secondary | ICD-10-CM | POA: Insufficient documentation

## 2024-04-08 DIAGNOSIS — I10 Essential (primary) hypertension: Secondary | ICD-10-CM

## 2024-04-08 MED ORDER — AMLODIPINE BESYLATE 10 MG PO TABS: 10.0000 mg | ORAL_TABLET | Freq: Every day | ORAL | 1 refills | Status: AC

## 2024-04-08 MED ORDER — CARVEDILOL 12.5 MG PO TABS: 12.5000 mg | ORAL_TABLET | Freq: Two times a day (BID) | ORAL | 1 refills | Status: AC

## 2024-04-08 MED ORDER — ALLOPURINOL 100 MG PO TABS
50.0000 mg | ORAL_TABLET | ORAL | 1 refills | Status: AC
Start: 2024-04-08 — End: ?

## 2024-04-08 NOTE — Telephone Encounter (Signed)
 I want to follow-up with the family about furosemide .  I would not normally have someone with such severe kidney disease unless Nephrology wants him to take it.    I thought he was going to start dialysis?

## 2024-04-08 NOTE — Telephone Encounter (Signed)
 Copied from CRM [id number]. Topic: Clinical - Medication Question >> Apr 07, 2024  1:50 PM Donna E wrote: Reason for CRM: patient son Elspeth would like ALL of the patients medications by previous provider to be now prescribed by   Dr Watt     allopurinol  (ZYLOPRIM ) 100 MG tablet amLODipine  (NORVASC ) 10 MG tablet  carvedilol  (COREG ) 12.5 MG tablet furosemide  (LASIX ) 20 MG tablet   [facility] [address] [facility] - [place], [place] - [address] AT [facility] [address] [place] [address] Phone: [phone] Fax: [phone]

## 2024-04-08 NOTE — Addendum Note (Signed)
 Addended by: WATT MIRZA on: 04/08/2024 08:13 PM   Modules accepted: Orders

## 2024-04-08 NOTE — Addendum Note (Signed)
 Addended by: Jaylia Pettus on: 04/08/2024 03:12 PM   Modules accepted: Orders

## 2024-04-08 NOTE — Telephone Encounter (Signed)
 Last OV:  03/09/24 Next OV:  none

## 2024-04-17 ENCOUNTER — Ambulatory Visit (HOSPITAL_COMMUNITY)
Admission: RE | Admit: 2024-04-17 | Discharge: 2024-04-17 | Disposition: A | Discharge status: Home / Self Care | Source: Ambulatory Visit | Attending: Internal Medicine | Admitting: Internal Medicine

## 2024-04-17 ENCOUNTER — Inpatient Hospital Stay (HOSPITAL_COMMUNITY): Admission: RE | Admit: 2024-04-17 | Source: Ambulatory Visit

## 2024-04-17 VITALS — BP 165/61 | HR 57 | Temp 98.2°F | Resp 17 | Wt 155.0 lb

## 2024-04-17 DIAGNOSIS — N185 Chronic kidney disease, stage 5: Secondary | ICD-10-CM | POA: Diagnosis not present

## 2024-04-17 DIAGNOSIS — D631 Anemia in chronic kidney disease: Secondary | ICD-10-CM | POA: Diagnosis not present

## 2024-04-17 LAB — POCT HEMOGLOBIN-HEMACUE: Hemoglobin: 9.4 g/dL — ABNORMAL LOW (ref 13.0–17.0)

## 2024-04-17 MED ORDER — EPOETIN ALFA-EPBX 40000 UNIT/ML IJ SOLN
40000.0000 [IU] | Freq: Once | INTRAMUSCULAR | Status: AC
  Administered 2024-04-17: 40000 [IU] via SUBCUTANEOUS

## 2024-04-17 MED ORDER — EPOETIN ALFA-EPBX 40000 UNIT/ML IJ SOLN
INTRAMUSCULAR | Status: AC
  Filled 2024-04-17: qty 1

## 2024-04-17 MED ORDER — SODIUM CHLORIDE 0.9 % IV SOLN
510.0000 mg | Freq: Once | INTRAVENOUS | Status: AC
  Administered 2024-04-17: 510 mg via INTRAVENOUS
  Filled 2024-04-17: qty 510

## 2024-04-23 ENCOUNTER — Other Ambulatory Visit: Payer: Self-pay | Admitting: Orthopedic Surgery

## 2024-04-23 DIAGNOSIS — I4821 Permanent atrial fibrillation: Secondary | ICD-10-CM

## 2024-04-28 ENCOUNTER — Other Ambulatory Visit: Payer: Self-pay | Admitting: Nurse Practitioner

## 2024-04-28 DIAGNOSIS — I509 Heart failure, unspecified: Secondary | ICD-10-CM | POA: Diagnosis not present

## 2024-04-28 DIAGNOSIS — N2581 Secondary hyperparathyroidism of renal origin: Secondary | ICD-10-CM | POA: Diagnosis not present

## 2024-04-28 DIAGNOSIS — N185 Chronic kidney disease, stage 5: Secondary | ICD-10-CM | POA: Diagnosis not present

## 2024-04-28 DIAGNOSIS — I130 Hypertensive heart and chronic kidney disease with heart failure and stage 1 through stage 4 chronic kidney disease, or unspecified chronic kidney disease: Secondary | ICD-10-CM | POA: Diagnosis not present

## 2024-04-28 DIAGNOSIS — D649 Anemia, unspecified: Secondary | ICD-10-CM | POA: Diagnosis not present

## 2024-05-01 ENCOUNTER — Ambulatory Visit (HOSPITAL_COMMUNITY)
Admission: RE | Admit: 2024-05-01 | Discharge: 2024-05-01 | Disposition: A | Discharge status: Home / Self Care | Source: Ambulatory Visit | Attending: Internal Medicine | Admitting: Internal Medicine

## 2024-05-01 VITALS — BP 160/70 | HR 50 | Temp 98.0°F | Resp 16

## 2024-05-01 DIAGNOSIS — D631 Anemia in chronic kidney disease: Secondary | ICD-10-CM | POA: Diagnosis present

## 2024-05-01 DIAGNOSIS — N185 Chronic kidney disease, stage 5: Secondary | ICD-10-CM | POA: Diagnosis present

## 2024-05-01 LAB — POCT HEMOGLOBIN-HEMACUE: Hemoglobin: 9.7 g/dL — ABNORMAL LOW (ref 13.0–17.0)

## 2024-05-01 MED ORDER — EPOETIN ALFA-EPBX 40000 UNIT/ML IJ SOLN
40000.0000 [IU] | Freq: Once | INTRAMUSCULAR | Status: AC
  Administered 2024-05-01: 40000 [IU] via SUBCUTANEOUS

## 2024-05-01 MED ORDER — EPOETIN ALFA-EPBX 40000 UNIT/ML IJ SOLN
INTRAMUSCULAR | Status: AC
  Filled 2024-05-01: qty 1

## 2024-05-01 MED ORDER — SODIUM CHLORIDE 0.9 % IV SOLN
510.0000 mg | Freq: Once | INTRAVENOUS | Status: AC
  Administered 2024-05-01: 510 mg via INTRAVENOUS
  Filled 2024-05-01: qty 510

## 2024-05-04 ENCOUNTER — Other Ambulatory Visit: Payer: Self-pay | Admitting: Nurse Practitioner

## 2024-05-05 ENCOUNTER — Other Ambulatory Visit: Payer: Self-pay | Admitting: Family Medicine

## 2024-05-05 DIAGNOSIS — I10 Essential (primary) hypertension: Secondary | ICD-10-CM

## 2024-05-05 DIAGNOSIS — N184 Chronic kidney disease, stage 4 (severe): Secondary | ICD-10-CM

## 2024-05-05 DIAGNOSIS — I5020 Unspecified systolic (congestive) heart failure: Secondary | ICD-10-CM

## 2024-05-05 DIAGNOSIS — I4821 Permanent atrial fibrillation: Secondary | ICD-10-CM

## 2024-05-05 MED ORDER — HYDRALAZINE HCL 100 MG PO TABS: 100.0000 mg | ORAL_TABLET | Freq: Three times a day (TID) | ORAL | 2 refills | Status: DC

## 2024-05-05 NOTE — Telephone Encounter (Signed)
 Copied from CRM #[id number]. Topic: Clinical - Medication Refill >> May 05, 2024  4:13 PM Shereese L wrote: Medication:  amLODipine  (NORVASC ) 10 MG tablet    Has the patient contacted their pharmacy? Yes (Agent: If no, request that the patient contact the pharmacy for the refill. If patient does not wish to contact the pharmacy document the reason why and proceed with request.) (Agent: If yes, when and what did the pharmacy advise?)  This is the patient's preferred pharmacy:  [facility] [address] [facility] - [place], [place] - [address] [facility] AT [facility] [address] [place] [address] Phone: [phone] Fax: [phone]  Is this the correct pharmacy for this prescription? Yes If no, delete pharmacy and type the correct one.   Has the prescription been filled recently? Yes  Is the patient out of the medication? Yes  Has the patient been seen for an appointment in the last year OR does the patient have an upcoming appointment? Yes  Can we respond through MyChart? Yes  Agent: Please be advised that Rx refills may take up to 3 business days. We ask that you follow-up with your pharmacy.

## 2024-05-05 NOTE — Telephone Encounter (Signed)
 Refilled on 04/08/2024 for #90 with 1 refill.  Just need to check with pharmacy.

## 2024-05-07 ENCOUNTER — Other Ambulatory Visit: Payer: Self-pay | Admitting: Family Medicine

## 2024-05-07 DIAGNOSIS — N184 Chronic kidney disease, stage 4 (severe): Secondary | ICD-10-CM

## 2024-05-07 DIAGNOSIS — I5020 Unspecified systolic (congestive) heart failure: Secondary | ICD-10-CM

## 2024-05-07 DIAGNOSIS — I4821 Permanent atrial fibrillation: Secondary | ICD-10-CM

## 2024-05-07 DIAGNOSIS — I10 Essential (primary) hypertension: Secondary | ICD-10-CM

## 2024-05-07 MED ORDER — APIXABAN 2.5 MG PO TABS: 2.5000 mg | ORAL_TABLET | Freq: Two times a day (BID) | ORAL | 2 refills | Status: AC

## 2024-05-07 NOTE — Telephone Encounter (Unsigned)
 Copied from CRM [id number]. Topic: Clinical - Medication Refill >> May 07, 2024  3:49 PM Suzen RAMAN wrote: Medication: hydrALAZINE  (APRESOLINE ) 50 MG tablet- per patient son the pharmacy doesn't have the 100 MG dosage in stock and would like to know if this accomodation can be made  apixaban  (ELIQUIS ) 2.5 MG TABS tablet   Has the patient contacted their pharmacy? Yes  This is the patient's preferred pharmacy:  [facility] [address] [facility] - [place], [place] - [address] [facility] AT [facility] [address] [place] [address] Phone: [phone] Fax: [phone]  Is this the correct pharmacy for this prescription? Yes If no, delete pharmacy and type the correct one.   Has the prescription been filled recently? No  Is the patient out of the medication? Yes  Has the patient been seen for an appointment in the last year OR does the patient have an upcoming appointment? Yes  Can we respond through MyChart? Yes  Agent: Please be advised that Rx refills may take up to 3 business days. We ask that you follow-up with your pharmacy.

## 2024-05-07 NOTE — Telephone Encounter (Signed)
 Medication: hydrALAZINE  (APRESOLINE ) 100 MG tablet- per patient son the pharmacy doesn't have the 100 MG dosage in stock and would like to know if this accomodation can be made

## 2024-05-08 MED ORDER — HYDRALAZINE HCL 50 MG PO TABS: 100.0000 mg | ORAL_TABLET | Freq: Three times a day (TID) | ORAL | 5 refills | Status: AC

## 2024-05-15 ENCOUNTER — Encounter (HOSPITAL_COMMUNITY)
Admission: RE | Admit: 2024-05-15 | Discharge: 2024-05-15 | Disposition: A | Discharge status: Home / Self Care | Source: Ambulatory Visit | Attending: Internal Medicine | Admitting: Internal Medicine

## 2024-05-15 VITALS — BP 179/76 | HR 61 | Temp 98.1°F | Resp 16

## 2024-05-15 DIAGNOSIS — N185 Chronic kidney disease, stage 5: Secondary | ICD-10-CM | POA: Diagnosis present

## 2024-05-15 DIAGNOSIS — D631 Anemia in chronic kidney disease: Secondary | ICD-10-CM | POA: Diagnosis present

## 2024-05-15 LAB — IRON AND TIBC
Iron: 33 ug/dL — ABNORMAL LOW (ref 45–182)
Saturation Ratios: 18 % (ref 17.9–39.5)
TIBC: 181 ug/dL — ABNORMAL LOW (ref 250–450)
UIBC: 148 ug/dL

## 2024-05-15 LAB — RENAL FUNCTION PANEL
Albumin: 3.4 g/dL — ABNORMAL LOW (ref 3.5–5.0)
Anion gap: 12 (ref 5–15)
BUN: 106 mg/dL — ABNORMAL HIGH (ref 8–23)
CO2: 17 mmol/L — ABNORMAL LOW (ref 22–32)
Calcium: 8.4 mg/dL — ABNORMAL LOW (ref 8.9–10.3)
Chloride: 110 mmol/L (ref 98–111)
Creatinine, Ser: 5.51 mg/dL — ABNORMAL HIGH (ref 0.61–1.24)
GFR, Estimated: 10 mL/min — ABNORMAL LOW (ref >60)
Glucose, Bld: 124 mg/dL — ABNORMAL HIGH (ref 70–99)
Phosphorus: 4.9 mg/dL — ABNORMAL HIGH (ref 2.5–4.6)
Potassium: 3.9 mmol/L (ref 3.5–5.1)
Sodium: 139 mmol/L (ref 135–145)

## 2024-05-15 LAB — FERRITIN: Ferritin: 329 ng/mL (ref 24–336)

## 2024-05-15 MED ORDER — EPOETIN ALFA-EPBX 40000 UNIT/ML IJ SOLN
40000.0000 [IU] | Freq: Once | INTRAMUSCULAR | Status: AC
  Administered 2024-05-15: 40000 [IU] via SUBCUTANEOUS

## 2024-05-15 MED FILL — Epoetin Alfa-epbx Inj 40000 Unit/ML: INTRAMUSCULAR | Qty: 1 | Status: AC

## 2024-05-16 LAB — PTH, INTACT AND CALCIUM
Calcium, Total (PTH): 8.8 mg/dL (ref 8.6–10.2)
PTH: 214 pg/mL — ABNORMAL HIGH (ref 15–65)

## 2024-05-18 LAB — POCT HEMOGLOBIN-HEMACUE: Hemoglobin: 10.3 g/dL — ABNORMAL LOW (ref 13.0–17.0)

## 2024-05-29 ENCOUNTER — Encounter (HOSPITAL_COMMUNITY)
Admission: RE | Admit: 2024-05-29 | Discharge: 2024-05-29 | Disposition: A | Discharge status: Home / Self Care | Source: Ambulatory Visit | Attending: Internal Medicine | Admitting: Internal Medicine

## 2024-05-29 VITALS — BP 151/66 | HR 70 | Temp 97.3°F

## 2024-05-29 DIAGNOSIS — D631 Anemia in chronic kidney disease: Secondary | ICD-10-CM

## 2024-05-29 DIAGNOSIS — N185 Chronic kidney disease, stage 5: Secondary | ICD-10-CM | POA: Diagnosis not present

## 2024-05-29 MED ORDER — EPOETIN ALFA-EPBX 40000 UNIT/ML IJ SOLN
40000.0000 [IU] | Freq: Once | INTRAMUSCULAR | Status: AC
  Administered 2024-05-29: 40000 [IU] via SUBCUTANEOUS

## 2024-05-29 MED ORDER — EPOETIN ALFA-EPBX 40000 UNIT/ML IJ SOLN
INTRAMUSCULAR | Status: AC
  Filled 2024-05-29: qty 1

## 2024-06-12 ENCOUNTER — Inpatient Hospital Stay (HOSPITAL_COMMUNITY): Admission: RE | Admit: 2024-06-12 | Source: Ambulatory Visit

## 2024-06-29 ENCOUNTER — Inpatient Hospital Stay (HOSPITAL_COMMUNITY): Admission: RE | Admit: 2024-06-29 | Source: Ambulatory Visit

## 2024-07-06 ENCOUNTER — Telehealth: Payer: Self-pay | Admitting: Family Medicine

## 2024-07-09 NOTE — Telephone Encounter (Signed)
 Made in error

## 2024-07-13 ENCOUNTER — Inpatient Hospital Stay (HOSPITAL_COMMUNITY): Admission: RE | Admit: 2024-07-13

## 2024-07-31 ENCOUNTER — Telehealth (HOSPITAL_COMMUNITY): Payer: Self-pay

## 2024-07-31 NOTE — Telephone Encounter (Signed)
 Auth Submission: NO AUTH NEEDED Site of care: Site of care: [facility] Payer: [employer] Medicare Medication & CPT/J Code(s) submitted: Retacrit  Diagnosis Code: N18.5/D63.1 Route of submission (phone, fax, portal): portal Phone # Fax # Auth type: Buy/Bill HB Units/visits requested: 40000 units q2weeks Reference number: [id number] Approval from: 07/30/24 to 07/29/25

## 2024-08-27 ENCOUNTER — Ambulatory Visit
